# Patient Record
Sex: Female | Born: 1940
Health system: Southern US, Community
[De-identification: ages and names within clinical notes are randomized; demographics above are authoritative.]

## PROBLEM LIST (undated history)

## (undated) DIAGNOSIS — E669 Obesity, unspecified: Secondary | ICD-10-CM

## (undated) DIAGNOSIS — C3490 Malignant neoplasm of unspecified part of unspecified bronchus or lung: Secondary | ICD-10-CM

## (undated) DIAGNOSIS — G4733 Obstructive sleep apnea (adult) (pediatric): Secondary | ICD-10-CM

## (undated) DIAGNOSIS — R9389 Abnormal findings on diagnostic imaging of other specified body structures: Secondary | ICD-10-CM

## (undated) DIAGNOSIS — F329 Major depressive disorder, single episode, unspecified: Secondary | ICD-10-CM

## (undated) DIAGNOSIS — R61 Generalized hyperhidrosis: Secondary | ICD-10-CM

## (undated) DIAGNOSIS — T8859XA Other complications of anesthesia, initial encounter: Secondary | ICD-10-CM

## (undated) DIAGNOSIS — J069 Acute upper respiratory infection, unspecified: Secondary | ICD-10-CM

## (undated) DIAGNOSIS — J449 Chronic obstructive pulmonary disease, unspecified: Secondary | ICD-10-CM

## (undated) DIAGNOSIS — R0602 Shortness of breath: Secondary | ICD-10-CM

## (undated) DIAGNOSIS — D649 Anemia, unspecified: Secondary | ICD-10-CM

## (undated) DIAGNOSIS — M1990 Unspecified osteoarthritis, unspecified site: Secondary | ICD-10-CM

## (undated) DIAGNOSIS — G629 Polyneuropathy, unspecified: Secondary | ICD-10-CM

## (undated) DIAGNOSIS — F32A Depression, unspecified: Secondary | ICD-10-CM

## (undated) DIAGNOSIS — I447 Left bundle-branch block, unspecified: Secondary | ICD-10-CM

## (undated) DIAGNOSIS — K219 Gastro-esophageal reflux disease without esophagitis: Secondary | ICD-10-CM

## (undated) DIAGNOSIS — E785 Hyperlipidemia, unspecified: Secondary | ICD-10-CM

## (undated) DIAGNOSIS — T783XXA Angioneurotic edema, initial encounter: Secondary | ICD-10-CM

## (undated) DIAGNOSIS — M069 Rheumatoid arthritis, unspecified: Secondary | ICD-10-CM

## (undated) DIAGNOSIS — I10 Essential (primary) hypertension: Secondary | ICD-10-CM

## (undated) DIAGNOSIS — Z789 Other specified health status: Secondary | ICD-10-CM

## (undated) DIAGNOSIS — C55 Malignant neoplasm of uterus, part unspecified: Secondary | ICD-10-CM

## (undated) DIAGNOSIS — T4145XA Adverse effect of unspecified anesthetic, initial encounter: Secondary | ICD-10-CM

## (undated) HISTORY — PX: CATARACT EXTRACTION, BILATERAL: SHX1313

## (undated) HISTORY — DX: Unspecified osteoarthritis, unspecified site: M19.90

## (undated) HISTORY — PX: TONSILLECTOMY: SUR1361

## (undated) HISTORY — DX: Essential (primary) hypertension: I10

## (undated) HISTORY — DX: Malignant neoplasm of uterus, part unspecified: C55

## (undated) HISTORY — PX: ABDOMINAL HYSTERECTOMY: SHX81

## (undated) HISTORY — PX: COLONOSCOPY W/ BIOPSIES AND POLYPECTOMY: SHX1376

## (undated) HISTORY — DX: Generalized hyperhidrosis: R61

## (undated) HISTORY — DX: Rheumatoid arthritis, unspecified: M06.9

## (undated) HISTORY — DX: Angioneurotic edema, initial encounter: T78.3XXA

## (undated) HISTORY — DX: Polyneuropathy, unspecified: G62.9

## (undated) HISTORY — DX: Acute upper respiratory infection, unspecified: J06.9

## (undated) HISTORY — DX: Hyperlipidemia, unspecified: E78.5

## (undated) HISTORY — DX: Obesity, unspecified: E66.9

## (undated) HISTORY — DX: Obstructive sleep apnea (adult) (pediatric): G47.33

---

## 1969-01-06 DIAGNOSIS — C55 Malignant neoplasm of uterus, part unspecified: Secondary | ICD-10-CM

## 1969-01-06 HISTORY — DX: Malignant neoplasm of uterus, part unspecified: C55

## 1969-01-06 HISTORY — PX: VESICOVAGINAL FISTULA CLOSURE W/ TAH: SUR271

## 1997-07-02 ENCOUNTER — Ambulatory Visit: Admission: RE | Admit: 1997-07-02 | Discharge: 1997-07-02 | Payer: Self-pay | Admitting: Neurology

## 1997-09-26 ENCOUNTER — Ambulatory Visit: Admission: RE | Admit: 1997-09-26 | Discharge: 1997-09-26 | Payer: Self-pay | Admitting: Pulmonary Disease

## 1999-03-11 ENCOUNTER — Ambulatory Visit (HOSPITAL_COMMUNITY): Admission: RE | Admit: 1999-03-11 | Discharge: 1999-03-11 | Payer: Self-pay | Admitting: Gastroenterology

## 1999-03-11 ENCOUNTER — Encounter (INDEPENDENT_AMBULATORY_CARE_PROVIDER_SITE_OTHER): Payer: Self-pay

## 1999-06-19 ENCOUNTER — Other Ambulatory Visit: Admission: RE | Admit: 1999-06-19 | Discharge: 1999-06-19 | Payer: Self-pay | Admitting: Internal Medicine

## 2000-11-05 ENCOUNTER — Other Ambulatory Visit: Admission: RE | Admit: 2000-11-05 | Discharge: 2000-11-05 | Payer: Self-pay | Admitting: *Deleted

## 2005-01-07 ENCOUNTER — Encounter (HOSPITAL_COMMUNITY): Admission: RE | Admit: 2005-01-07 | Discharge: 2005-04-07 | Payer: Self-pay | Admitting: Internal Medicine

## 2005-02-17 ENCOUNTER — Ambulatory Visit (HOSPITAL_COMMUNITY): Admission: RE | Admit: 2005-02-17 | Discharge: 2005-02-17 | Payer: Self-pay | Admitting: Gastroenterology

## 2005-05-06 ENCOUNTER — Encounter (HOSPITAL_COMMUNITY): Admission: RE | Admit: 2005-05-06 | Discharge: 2005-08-04 | Payer: Self-pay | Admitting: Internal Medicine

## 2005-09-02 ENCOUNTER — Encounter (HOSPITAL_COMMUNITY): Admission: RE | Admit: 2005-09-02 | Discharge: 2005-12-01 | Payer: Self-pay | Admitting: Internal Medicine

## 2005-12-11 ENCOUNTER — Encounter (HOSPITAL_COMMUNITY): Admission: RE | Admit: 2005-12-11 | Discharge: 2006-03-11 | Payer: Self-pay | Admitting: Internal Medicine

## 2006-03-26 ENCOUNTER — Encounter (HOSPITAL_COMMUNITY): Admission: RE | Admit: 2006-03-26 | Discharge: 2006-06-24 | Payer: Self-pay | Admitting: Internal Medicine

## 2006-07-14 ENCOUNTER — Encounter (HOSPITAL_COMMUNITY): Admission: RE | Admit: 2006-07-14 | Discharge: 2006-10-12 | Payer: Self-pay | Admitting: Internal Medicine

## 2006-11-03 ENCOUNTER — Encounter (HOSPITAL_COMMUNITY): Admission: RE | Admit: 2006-11-03 | Discharge: 2007-02-01 | Payer: Self-pay | Admitting: Internal Medicine

## 2007-01-15 ENCOUNTER — Encounter: Admission: RE | Admit: 2007-01-15 | Discharge: 2007-01-15 | Payer: Self-pay | Admitting: Family Medicine

## 2007-02-16 ENCOUNTER — Encounter (HOSPITAL_COMMUNITY): Admission: RE | Admit: 2007-02-16 | Discharge: 2007-05-17 | Payer: Self-pay | Admitting: Internal Medicine

## 2007-06-01 ENCOUNTER — Encounter (HOSPITAL_COMMUNITY): Admission: RE | Admit: 2007-06-01 | Discharge: 2007-08-30 | Payer: Self-pay | Admitting: Internal Medicine

## 2007-09-14 ENCOUNTER — Encounter (HOSPITAL_COMMUNITY): Admission: RE | Admit: 2007-09-14 | Discharge: 2007-12-13 | Payer: Self-pay | Admitting: Internal Medicine

## 2008-01-04 ENCOUNTER — Encounter (HOSPITAL_COMMUNITY): Admission: RE | Admit: 2008-01-04 | Discharge: 2008-01-06 | Payer: Self-pay | Admitting: Internal Medicine

## 2008-01-06 ENCOUNTER — Other Ambulatory Visit: Admission: RE | Admit: 2008-01-06 | Discharge: 2008-01-06 | Payer: Self-pay | Admitting: Family Medicine

## 2008-02-01 ENCOUNTER — Encounter (HOSPITAL_COMMUNITY): Admission: RE | Admit: 2008-02-01 | Discharge: 2008-05-01 | Payer: Self-pay | Admitting: Internal Medicine

## 2008-03-11 ENCOUNTER — Encounter: Admission: RE | Admit: 2008-03-11 | Discharge: 2008-03-11 | Payer: Self-pay | Admitting: Family Medicine

## 2008-05-23 ENCOUNTER — Encounter (HOSPITAL_COMMUNITY): Admission: RE | Admit: 2008-05-23 | Discharge: 2008-08-21 | Payer: Self-pay | Admitting: Internal Medicine

## 2008-09-05 ENCOUNTER — Encounter (HOSPITAL_COMMUNITY): Admission: RE | Admit: 2008-09-05 | Discharge: 2008-12-04 | Payer: Self-pay | Admitting: Internal Medicine

## 2008-12-19 ENCOUNTER — Encounter (HOSPITAL_COMMUNITY): Admission: RE | Admit: 2008-12-19 | Discharge: 2009-03-19 | Payer: Self-pay | Admitting: Internal Medicine

## 2009-01-22 ENCOUNTER — Encounter: Admission: RE | Admit: 2009-01-22 | Discharge: 2009-01-22 | Payer: Self-pay | Admitting: Family Medicine

## 2009-04-11 ENCOUNTER — Encounter (HOSPITAL_COMMUNITY): Admission: RE | Admit: 2009-04-11 | Discharge: 2009-07-10 | Payer: Self-pay | Admitting: Internal Medicine

## 2009-06-26 DIAGNOSIS — E7800 Pure hypercholesterolemia, unspecified: Secondary | ICD-10-CM | POA: Insufficient documentation

## 2009-06-26 DIAGNOSIS — I10 Essential (primary) hypertension: Secondary | ICD-10-CM | POA: Insufficient documentation

## 2009-06-26 DIAGNOSIS — D649 Anemia, unspecified: Secondary | ICD-10-CM | POA: Insufficient documentation

## 2009-06-26 DIAGNOSIS — D069 Carcinoma in situ of cervix, unspecified: Secondary | ICD-10-CM | POA: Insufficient documentation

## 2009-06-26 DIAGNOSIS — E669 Obesity, unspecified: Secondary | ICD-10-CM | POA: Insufficient documentation

## 2009-06-26 DIAGNOSIS — M069 Rheumatoid arthritis, unspecified: Secondary | ICD-10-CM | POA: Insufficient documentation

## 2009-06-26 DIAGNOSIS — M810 Age-related osteoporosis without current pathological fracture: Secondary | ICD-10-CM | POA: Insufficient documentation

## 2009-06-26 DIAGNOSIS — R059 Cough, unspecified: Secondary | ICD-10-CM | POA: Insufficient documentation

## 2009-06-26 DIAGNOSIS — D638 Anemia in other chronic diseases classified elsewhere: Secondary | ICD-10-CM | POA: Insufficient documentation

## 2009-06-26 DIAGNOSIS — R05 Cough: Secondary | ICD-10-CM | POA: Insufficient documentation

## 2009-06-27 ENCOUNTER — Ambulatory Visit: Payer: Self-pay | Admitting: Emergency Medicine

## 2009-07-20 ENCOUNTER — Ambulatory Visit: Payer: Self-pay | Admitting: Emergency Medicine

## 2009-07-20 DIAGNOSIS — J449 Chronic obstructive pulmonary disease, unspecified: Secondary | ICD-10-CM | POA: Insufficient documentation

## 2009-07-26 ENCOUNTER — Encounter (HOSPITAL_COMMUNITY): Admission: RE | Admit: 2009-07-26 | Discharge: 2009-10-24 | Payer: Self-pay | Admitting: Internal Medicine

## 2009-08-07 ENCOUNTER — Encounter: Payer: Self-pay | Admitting: Emergency Medicine

## 2009-11-07 ENCOUNTER — Encounter (HOSPITAL_COMMUNITY)
Admission: RE | Admit: 2009-11-07 | Discharge: 2010-02-05 | Payer: Self-pay | Source: Home / Self Care | Attending: Internal Medicine | Admitting: Internal Medicine

## 2010-01-27 ENCOUNTER — Encounter: Payer: Self-pay | Admitting: Family Medicine

## 2010-02-05 NOTE — Assessment & Plan Note (Signed)
Summary: cough, COPD   Visit Type:  Follow-up Primary Provider/Referring Provider:  Maurice Small  CC:  Cough follow-up with PFT's.  The patient says her cough is slightly better. Still coughing up mostly clear or white mucus. SOB with exertion.Taylor Berry  History of Present Illness: 70 yo woman, former smoker, with obesity, HTN, RA, allergic rhinitis, OSA dx by PSG not currently on CPAP due to intolerance of high pressure. Referred for evaluation for chronic cough.   Has been seen for chronic cough by Dr Massie Maroon for cough over the last 2 yrs. Prior to that she did not cough frequently, only with URIs. She reports persistent cough for about 2 yrs, usuallly non-prod but sometimes white to clear, appears to be worse during the winter months. Has also had PND and allergies that have remained bothersome. Was tried on zyrtec, also on Advair, QVAR at one point, not on these currently. Was treated for the last 2 months empirically for GERD with lansoprasole, off it now. She sometimes wakes at night with cough, PND. No real voice changes, some globus sensation and throat clearing.   ROV 07/20/09 -- returns for cough and exertional dyspnea.  We started loratadine + fluticasone + NSWs, also omeprazole. Cough is better, still with nasal congestion and some gtt.  bv Current Medications (verified): 1)  Benicar 40 Mg Tabs (Olmesartan Medoxomil) .Taylor Berry.. 1 Once Daily 2)  Proair Hfa 108 (90 Base) Mcg/act Aers (Albuterol Sulfate) .... 2 Puffs Every 4-6 Hours As Needed 3)  Effexor Xr 75 Mg Xr24h-Cap (Venlafaxine Hcl) .Taylor Berry.. 1 Once Daily 4)  Calcium Plus Vitamin D 500-50 Mg-Unit Caps (Calcium Carbonate-Vitamin D) .... 2 Once Daily 5)  Remicade 100 Mg Solr (Infliximab) .... As Directed 6)  Multivitamins  Tabs (Multiple Vitamin) .Taylor Berry.. 1 Once Daily 7)  Folic Acid 1 Mg Tabs (Folic Acid) .Taylor Berry.. 1 Once Daily 8)  Zocor 20 Mg Tabs (Simvastatin) .Taylor Berry.. 1 Tablet At Bedtime 9)  Loratadine 10 Mg Tabs (Loratadine) .Taylor Berry.. 1 By Mouth Once  Daily 10)  Omeprazole 20 Mg Cpdr (Omeprazole) .Taylor Berry.. 1 By Mouth Once Daily 11)  Fluticasone Propionate 50 Mcg/act Susp (Fluticasone Propionate) .... 2 Sprays Each Nostril Two Times A Day  Allergies (verified): 1)  ! Ace Inhibitors 2)  ! Hydrochlorothiazide 3)  ! * Latex 4)  Pcn  Vital Signs:  Patient profile:   70 year old female Height:      64 inches (162.56 cm) Weight:      262 pounds (119.09 kg) BMI:     45.13 O2 Sat:      93 % on Room air Temp:     97.6 degrees F (36.44 degrees C) oral Pulse rate:   97 / minute BP sitting:   126 / 80  (left arm) Cuff size:   large  Vitals Entered By: Michel Bickers CMA (July 20, 2009 1:10 PM)  O2 Sat at Rest %:  93 O2 Flow:  Room air CC: Cough follow-up with PFT's.  The patient says her cough is slightly better. Still coughing up mostly clear or white mucus. SOB with exertion. Comments Medications reviewed. Daytime phone verified. Michel Bickers [facility]  July 20, 2009 1:11 PM   Physical Exam  General:  normal appearance, healthy appearing, and obese.   Head:  normocephalic and atraumatic Eyes:  conjunctiva and sclera clear Nose:  no deformity, discharge, inflammation, or lesions Mouth:  narrow post pharynx Neck:  no masses, thyromegaly, or abnormal cervical nodes Lungs:  clear bilaterally to auscultation and  percussion Heart:  regular rate and rhythm, S1, S2 without murmurs, rubs, gallops, or clicks Abdomen:  obese, NT Msk:  no deformity or scoliosis noted with normal posture Extremities:  trace edema Neurologic:  non-focal   Pulmonary Function Test Date: 07/20/2009 Height (in.): 64 Gender: Female  Pre-Spirometry FVC    Value: 1.93 L/min   Pred: 2.88 L/min     % Pred: 67 % FEV1    Value: 1.31 L     Pred: 2.06 L     % Pred: 64 % FEV1/FVC  Value: 68 %     Pred: 72 %    FEF 25-75  Value: 0.86 L/min   Pred: 2.35 L/min     % Pred: 36 %  Post-Spirometry FVC    Value: 1.82 L/min   Pred: 2.88 L/min     % Pred: 63 % FEV1    Value: 1.24 L      Pred: 2.06 L     % Pred: 60 % FEV1/FVC  Value: 68 %     Pred: 72 %    FEF 25-75  Value: 0.74 L/min   Pred: 2.35 L/min     % Pred: 31 %  Lung Volumes TLC    Value: 3.49 L   % Pred: 72 % RV    Value: 1.55 L   % Pred: 81 % DLCO    Value: 15.4 %   % Pred: 58 % DLCO/VA  Value: 5.38 %   % Pred: 151 %  Comments: Mild to mod AFL, no BD response, restricted volumes, DLCO corrects to normal range for Va. RSV  Impression & Recommendations:  Problem # 1:  COUGH (ICD-786.2) Has responded to allergy regimen, also on pantoprazole.  - NSW - loratadine - fluticasone - stop pantoprazole.  - refer to ENT for persistant symptoms.  - ROV 6 mo or as needed   Problem # 2:  COPD (ICD-496) By PFT. Mild to mod disease.  - will defer BD's at this time.  - may need to reconsider in the future.   Other Orders: Est. Patient Level IV (45409) ENT Referral (ENT)  Patient Instructions: 1)  Continue your loratadine, fluticasone, and nasal saline washes 2)  Stop pantoprazole 3)  We will refer you to see ENT regarding your nasal congestion and obstruction 4)  Follow up with Dr Delton Coombes in 6 months or as needed

## 2010-02-05 NOTE — Consult Note (Signed)
Summary: [facility]  [facility]   Imported By: Sherian Rein 08/22/2009 15:13:21  _____________________________________________________________________  External Attachment:    Type:   Image     Comment:   External Document

## 2010-02-05 NOTE — Assessment & Plan Note (Signed)
Summary: cough   Visit Type:  Initial Consult Primary Provider/Referring Provider:  Maurice Small  CC:  Chronic Cough.  History of Present Illness: 70 yo woman, former smoker, with obesity, HTN, RA, allergic rhinitis, OSA dx by PSG not currently on CPAP due to intolerance of high pressure. Referred for evaluation for chronic cough.   Has been seen for chronic cough by Dr Massie Maroon for cough over the last 2 yrs. Prior to that she did not cough frequently, only with URIs. She reports persistent cough for about 2 yrs, usuallly non-prod but sometimes white to clear, appears to be worse during the winter months. Has also had PND and allergies that have remained bothersome. Was tried on zyrtec, also on Advair, QVAR at one point, not on these currently. Was treated for the last 2 months empirically for GERD with lansoprasole, off it now. She sometimes wakes at night with cough, PND. No real voice changes, some globus sensation and throat clearing.   CXR clear  Preventive Screening-Counseling & Management  Alcohol-Tobacco     Smoking Status: quit  Current Medications (verified): 1)  Benicar 40 Mg Tabs (Olmesartan Medoxomil) .Marland Kitchen.. 1 Once Daily 2)  Proair Hfa 108 (90 Base) Mcg/act Aers (Albuterol Sulfate) .... 2 Puffs Every 4-6 Hours As Needed 3)  Effexor Xr 75 Mg Xr24h-Cap (Venlafaxine Hcl) .Marland Kitchen.. 1 Once Daily 4)  Calcium Plus Vitamin D 500-50 Mg-Unit Caps (Calcium Carbonate-Vitamin D) .... 2 Once Daily 5)  Remicade 100 Mg Solr (Infliximab) .... As Directed 6)  Multivitamins  Tabs (Multiple Vitamin) .Marland Kitchen.. 1 Once Daily 7)  Folic Acid 1 Mg Tabs (Folic Acid) .Marland Kitchen.. 1 Once Daily 8)  Zocor 20 Mg Tabs (Simvastatin) .Marland Kitchen.. 1 Tablet At Bedtime  Allergies: 1)  ! Ace Inhibitors 2)  ! Hydrochlorothiazide 3)  ! 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  quit smoking in 1992, started in 1956 smoked about 1 pack per day Quit drinking in 1988, started 1968 Smoking Status:  quit  Vital Signs:  Patient profile:   70 year old female Height:      63 inches Weight:      262 pounds BMI:     46.58 O2 Sat:      95 % on Room air Temp:     98.2 degrees F oral Pulse rate:   78 / minute BP sitting:   120 / 78  (left arm) Cuff size:   large  Vitals Entered By: Denna Haggard, CMA (June 27, 2009 2:52 PM)  O2 Sat at Rest %:  95% O2 Flow:  Room air  Reason for Visit Chronic Cough  Physical Exam  General:  normal appearance, healthy appearing, and obese.   Head:  normocephalic and atraumatic Eyes:  conjunctiva and sclera clear Nose:  no deformity, discharge, inflammation, or lesions Mouth:  narrow post pharynx Neck:  no masses, thyromegaly, or abnormal cervical nodes Lungs:  clear bilaterally to auscultation and percussion Heart:  regular rate and rhythm, S1, S2 without murmurs, rubs, gallops, or clicks Abdomen:  obese, NT Msk:  no deformity or scoliosis noted with normal posture Extremities:  trace edema Neurologic:  CN II-XII grossly intact with normal reflexes, coordination, muscle strength and tone  Impression & Recommendations:  Problem # 1:  COUGH (ICD-786.2)  Contributors include PND +/- GERD. She is off the ACE-I, now on ARB.  Start NSW, fluticasone spray, loratadine Restart pantoprazole full PFT consider FOB in future if no impact on cough discussed throat clearing and voice rest w her today.  ROV 3 weeks  Orders: Prescription Created Electronically [phone]) Consultation Level IV [phone])  Medications Added to Medication List This Visit: 1)  Zocor 20 Mg Tabs (Simvastatin) .Marland Kitchen.. 1 tablet at bedtime 2)  Loratadine 10 Mg Tabs (Loratadine) .Marland Kitchen.. 1 by mouth once daily 3)  Omeprazole 20 Mg  Cpdr (Omeprazole) .Marland Kitchen.. 1 by mouth once daily 4)  Fluticasone Propionate 50 Mcg/act Susp (Fluticasone propionate) .... 2 sprays each nostril two times a day  Patient Instructions: 1)  Start loratadine 10mg  by mouth once daily  2)  Start fluticasone nasal spray, 2 sprays each nostril two times a day 3)  Start nasal saline rinses once daily  4)  Restart your pantoprazole once daily  5)  Full PFT's  6)  Follow up with Dr Delton Coombes in 3 weeks to review your symptoms Prescriptions: FLUTICASONE PROPIONATE 50 MCG/ACT SUSP (FLUTICASONE PROPIONATE) 2 sprays each nostril two times a day  #1 x 4   Entered and Authorized by:   Leslye Peer MD   Signed by:   Leslye Peer MD on 06/27/2009   Method used:   Electronically to        [facility] (retail)       [address]       [place], [place]  [address]       Ph: [phone] or [phone]       Fax: [phone]   RxID:   [id number] OMEPRAZOLE 20 MG CPDR (OMEPRAZOLE) 1 by mouth once daily  #30 x 4   Entered and Authorized by:   Leslye Peer MD   Signed by:   Leslye Peer MD on 06/27/2009   Method used:   Electronically to        [facility] (retail)       [address]       [place], [place]  [address]       Ph: [phone] or [phone]       Fax: [phone]   RxID:   [id number] LORATADINE 10 MG TABS (LORATADINE) 1 by mouth once daily  #30 x 4   Entered and Authorized by:   Leslye Peer MD   Signed by:   Leslye Peer MD on 06/27/2009   Method used:   Electronically to        [facility] (retail)       [address]       [place], [place]  [address]       Ph: [phone] or [phone]       Fax: [phone]   RxID:   [id number]    Immunization History:  Influenza Immunization History:    Influenza:  fluvax 3+ (10/06/2008)  Pneumovax Immunization History:    Pneumovax:  pneumovax (12/08/2007)

## 2010-02-05 NOTE — Miscellaneous (Signed)
Summary: Orders Update pft charges  Clinical Lists Changes  Orders: Added new Service order of Carbon Monoxide diffusing w/capacity (94720) - Signed Added new Service order of Lung Volumes (94240) - Signed Added new Service order of Spirometry (Pre & Post) (94060) - Signed 

## 2010-02-15 ENCOUNTER — Ambulatory Visit
Admission: RE | Admit: 2010-02-15 | Discharge: 2010-02-15 | Disposition: A | Discharge status: Home / Self Care | Payer: Medicare Other | Source: Ambulatory Visit | Attending: Family Medicine | Admitting: Family Medicine

## 2010-02-15 ENCOUNTER — Other Ambulatory Visit: Payer: Self-pay | Admitting: Family Medicine

## 2010-02-15 DIAGNOSIS — R05 Cough: Secondary | ICD-10-CM

## 2010-02-15 DIAGNOSIS — R059 Cough, unspecified: Secondary | ICD-10-CM

## 2010-02-20 ENCOUNTER — Encounter (HOSPITAL_COMMUNITY)
Admission: RE | Admit: 2010-02-20 | Discharge: 2010-02-20 | Disposition: A | Discharge status: Home / Self Care | Payer: Medicare Other | Source: Ambulatory Visit | Attending: Internal Medicine | Admitting: Internal Medicine

## 2010-02-20 DIAGNOSIS — M069 Rheumatoid arthritis, unspecified: Secondary | ICD-10-CM | POA: Insufficient documentation

## 2010-03-27 ENCOUNTER — Encounter (HOSPITAL_COMMUNITY): Payer: Medicare Other | Attending: Internal Medicine

## 2010-03-27 DIAGNOSIS — M069 Rheumatoid arthritis, unspecified: Secondary | ICD-10-CM | POA: Insufficient documentation

## 2010-05-01 ENCOUNTER — Encounter (HOSPITAL_COMMUNITY): Payer: No Typology Code available for payment source

## 2010-05-06 ENCOUNTER — Encounter (HOSPITAL_COMMUNITY): Payer: Medicare Other | Attending: Internal Medicine

## 2010-05-06 DIAGNOSIS — M069 Rheumatoid arthritis, unspecified: Secondary | ICD-10-CM | POA: Insufficient documentation

## 2010-05-24 NOTE — Op Note (Signed)
NAMEKELIYAH, SPILLMAN NO.:  [id number]   MEDICAL RECORD NO.:  [id number]          PATIENT TYPE:  AMB   LOCATION:  ENDO                         FACILITY:  [facility]   PHYSICIAN:  James L. Malon Kindle., M.D.DATE OF BIRTH:  Nov 18, 1940   DATE OF PROCEDURE:  02/17/2005  DATE OF DISCHARGE:                                 OPERATIVE REPORT   PROCEDURE:  Colonoscopy.   MEDICATIONS:  Not recorded on my sheet by the nurse.  Will need to see the  nurse's notes for the exact count.   SCOPE:  Olympus pediatric adjustable colonoscope.   INDICATIONS:  Previous history of adenomatous polyp __________.   DESCRIPTION OF PROCEDURE:  The procedure explained to the patient and  consent obtained.  In the left lateral decubitus position, the Olympus scope  was inserted and advanced.  The prep was excellent.  Using abdominal  pressure and placing the patient in supine position, we arrived at the  cecum.  The ileocecal valve was seen transiently.  The appendiceal orifice  was seen.  The scope was withdrawn.  The mucosa was carefully examined for  polyps.  No polyps were seen throughout.  There were multiple small  diverticula throughout the colon.  The patient had pan diverticulosis.  It  was particularly prominent in the left colon and was also present in the  transverse and right colon.  The scope was withdrawn.  The patient tolerated  the procedure well.   ASSESSMENT:  1.  History of polyps with negative colon at this time.  V12.72.  2.  Pan diverticulosis.  562.10.   PLAN:  Routine followup with yearly Hemoccults.  Repeat colon in five years.           ______________________________  Llana Aliment Malon Kindle., M.D.     Waldron Session  D:  02/17/2005  T:  02/17/2005  Job:  [id number]   cc:   Gretta Arab. Valentina Lucks, M.D.  Fax: [phone]   Dr. Weston Anna

## 2010-06-10 ENCOUNTER — Encounter (HOSPITAL_COMMUNITY): Payer: Medicare Other | Attending: Internal Medicine

## 2010-06-10 DIAGNOSIS — M069 Rheumatoid arthritis, unspecified: Secondary | ICD-10-CM | POA: Insufficient documentation

## 2010-07-15 ENCOUNTER — Encounter (HOSPITAL_COMMUNITY): Payer: Medicare Other

## 2010-07-19 ENCOUNTER — Encounter (HOSPITAL_COMMUNITY): Payer: Medicare Other | Attending: Internal Medicine

## 2010-07-19 DIAGNOSIS — M069 Rheumatoid arthritis, unspecified: Secondary | ICD-10-CM | POA: Insufficient documentation

## 2010-08-16 ENCOUNTER — Encounter (HOSPITAL_COMMUNITY): Payer: Medicare Other

## 2010-08-28 ENCOUNTER — Encounter (HOSPITAL_COMMUNITY): Payer: Medicare Other | Attending: Internal Medicine

## 2010-08-28 ENCOUNTER — Encounter (HOSPITAL_COMMUNITY): Payer: Medicare Other

## 2010-08-28 DIAGNOSIS — M069 Rheumatoid arthritis, unspecified: Secondary | ICD-10-CM | POA: Insufficient documentation

## 2010-10-02 ENCOUNTER — Encounter (HOSPITAL_COMMUNITY): Payer: Medicare Other | Attending: Internal Medicine

## 2010-10-02 DIAGNOSIS — M069 Rheumatoid arthritis, unspecified: Secondary | ICD-10-CM | POA: Insufficient documentation

## 2010-11-06 ENCOUNTER — Encounter (HOSPITAL_COMMUNITY)
Admission: RE | Admit: 2010-11-06 | Discharge: 2010-11-06 | Disposition: A | Discharge status: Home / Self Care | Payer: Medicare Other | Source: Ambulatory Visit | Attending: Internal Medicine | Admitting: Internal Medicine

## 2010-11-06 DIAGNOSIS — M069 Rheumatoid arthritis, unspecified: Secondary | ICD-10-CM | POA: Insufficient documentation

## 2010-11-21 ENCOUNTER — Encounter: Payer: Self-pay | Admitting: Emergency Medicine

## 2010-11-22 ENCOUNTER — Ambulatory Visit (INDEPENDENT_AMBULATORY_CARE_PROVIDER_SITE_OTHER): Payer: Medicare Other | Admitting: Emergency Medicine

## 2010-11-22 ENCOUNTER — Encounter: Payer: Self-pay | Admitting: Emergency Medicine

## 2010-11-22 DIAGNOSIS — R0609 Other forms of dyspnea: Secondary | ICD-10-CM | POA: Insufficient documentation

## 2010-11-22 DIAGNOSIS — J449 Chronic obstructive pulmonary disease, unspecified: Secondary | ICD-10-CM

## 2010-11-22 DIAGNOSIS — R059 Cough, unspecified: Secondary | ICD-10-CM

## 2010-11-22 DIAGNOSIS — R0989 Other specified symptoms and signs involving the circulatory and respiratory systems: Secondary | ICD-10-CM

## 2010-11-22 DIAGNOSIS — J4489 Other specified chronic obstructive pulmonary disease: Secondary | ICD-10-CM

## 2010-11-22 DIAGNOSIS — R0600 Dyspnea, unspecified: Secondary | ICD-10-CM | POA: Insufficient documentation

## 2010-11-22 DIAGNOSIS — R05 Cough: Secondary | ICD-10-CM

## 2010-11-22 NOTE — Patient Instructions (Signed)
Stop QVAR Start Spiriva 1 inhalation daily Use Ventolin 2 puffs if needed for shortness of breath We will refer you to see Cardiology to insure no contribution to your shortness of breath Consider restarting your loratadine for nasal congestion Walking oximetry today  Follow with Dr Delton Coombes in 1 month

## 2010-11-22 NOTE — Assessment & Plan Note (Signed)
-   d/c QVAR - start Spiriva + SABA, asked her to use SABA more liberally to assess true benefit

## 2010-11-22 NOTE — Progress Notes (Signed)
70 yo woman, former smoker, with obesity, HTN, RA, allergic rhinitis, OSA dx by PSG not currently on CPAP due to intolerance of high pressure. Referred for evaluation for chronic cough.   Has been seen for chronic cough by Dr Massie Maroon for cough over the last 2 yrs. Prior to that she did not cough frequently, only with URIs. She reports persistent cough for about 2 yrs, usuallly non-prod but sometimes white to clear, appears to be worse during the winter months. Has also had PND and allergies that have remained bothersome. Was tried on zyrtec, also on Advair, QVAR at one point, not on these currently. Was treated for the last 2 months empirically for GERD with lansoprasole, off it now. She sometimes wakes at night with cough, PND. No real voice changes, some globus sensation and throat clearing.   ROV 07/20/09 -- returns for cough and exertional dyspnea. We started loratadine + fluticasone + NSWs, also omeprazole. Cough is better, still with nasal congestion and some gtt.   ROV 11/22/10 -- Seen previously for dyspnea and chronic cough.Also hx untreated OSA.  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 Her cough got better with rx PND and GERD, she was seen by Dr Annalee Genta - eval consistent w GERD related cough.  Current regimen = omeprazole (stopped fluticasone and loratadine). Her cough is better now, she believes that stopping Ca has made this better. Still has a little cough.  Her biggest complaint is progressive exertional dyspnea since July '12. Notices it with walking. Her eval has included clear CXR's, normal ECG.  She had a stress test in 2002 (?). Her wt has been stable.  She was told to go back on QVAR 2 weeks ago -> no change in her SOB. Has not used SABA.  She still believes that she cannot do CPAP due to the high required pressures. She has never seen a cardiologist formally, interested in seeing Dr Jens Som.    EXAM:  Gen: Pleasant, well-nourished, in no  distress,  normal affect  ENT: No lesions,  mouth clear,  oropharynx clear, no postnasal drip  Neck: No JVD, no TMG, no carotid bruits  Lungs: No use of accessory muscles, no dullness to percussion, clear without rales or rhonchi  Cardiovascular: RRR, heart sounds normal, no murmur or gallops, no peripheral edema  Abdomen: soft and NT, no HSM,  BS normal  Musculoskeletal: No deformities, no cyanosis or clubbing  Neuro: alert, non focal  Skin: Warm, no lesions or rashes   Pulmonary Function Test  Date: 07/20/2009  Height (in.): 64  Gender: Female  Pre-Spirometry  FVC Value: 1.93 L/min Pred: 2.88 L/min % Pred: 67 %  FEV1 Value: 1.31 L Pred: 2.06 L % Pred: 64 %  FEV1/FVC Value: 68 % Pred: 72 % FEF 25-75 Value: 0.86 L/min Pred: 2.35 L/min % Pred: 36 %  Post-Spirometry  FVC Value: 1.82 L/min Pred: 2.88 L/min % Pred: 63 %  FEV1 Value: 1.24 L Pred: 2.06 L % Pred: 60 %  FEV1/FVC Value: 68 % Pred: 72 % FEF 25-75 Value: 0.74 L/min Pred: 2.35 L/min % Pred: 31 %  Lung Volumes  TLC Value: 3.49 L % Pred: 72 %  RV Value: 1.55 L % Pred: 81 %  DLCO Value: 15.4 % % Pred: 58 %  DLCO/VA Value: 5.38 % % Pred: 151 %  Comments:  Mild to mod AFL, no BD response, restricted volumes, DLCO corrects to normal range for Va.  RSV    Dyspnea on exertion Suspect that this is related to her COPD (currently untreated), obesity. Must consider also cardiac component or secondary PAH due to untreated OSA.  - will start COPD meds as below - walking oximetry today - discussed OSA, possible other options for treatment - feel she needs cardiac risk stratification, will refer for eval by Dr Jens Som - rov 1 month to review   COPD - d/c QVAR - start Spiriva + SABA, asked her to use SABA more liberally to assess true benefit   COUGH - better, although on exam it would appear that she would benefit from restarting her allergy regimen

## 2010-11-22 NOTE — Assessment & Plan Note (Signed)
-   better, although on exam it would appear that she would benefit from restarting her allergy regimen

## 2010-11-22 NOTE — Assessment & Plan Note (Signed)
Suspect that this is related to her COPD (currently untreated), obesity. Must consider also cardiac component or secondary PAH due to untreated OSA.  - will start COPD meds as below - walking oximetry today - discussed OSA, possible other options for treatment - feel she needs cardiac risk stratification, will refer for eval by Dr Jens Som - rov 1 month to review

## 2010-12-02 ENCOUNTER — Telehealth: Payer: Self-pay | Admitting: Emergency Medicine

## 2010-12-02 MED ORDER — TIOTROPIUM BROMIDE MONOHYDRATE 18 MCG IN CAPS: 18.0000 ug | ORAL_CAPSULE | Freq: Every day | RESPIRATORY_TRACT | Status: DC

## 2010-12-02 NOTE — Telephone Encounter (Signed)
Pt stated she has completed sample given of Spiriva. Rx sent to pt's pharmacy. Pt aware.

## 2010-12-03 ENCOUNTER — Telehealth: Payer: Self-pay | Admitting: Emergency Medicine

## 2010-12-03 NOTE — Telephone Encounter (Signed)
I have called [facility] and gave verbal order for pt's spiriva. Pt is aware

## 2010-12-06 ENCOUNTER — Ambulatory Visit (HOSPITAL_COMMUNITY): Payer: Self-pay | Admitting: *Deleted

## 2010-12-10 ENCOUNTER — Encounter (HOSPITAL_COMMUNITY)
Admission: RE | Admit: 2010-12-10 | Discharge: 2010-12-10 | Disposition: A | Discharge status: Home / Self Care | Payer: Medicare Other | Source: Ambulatory Visit | Attending: Internal Medicine | Admitting: Internal Medicine

## 2010-12-10 DIAGNOSIS — M069 Rheumatoid arthritis, unspecified: Secondary | ICD-10-CM | POA: Insufficient documentation

## 2010-12-10 MED ORDER — SODIUM CHLORIDE 0.9 % IV SOLN
600.0000 mg | INTRAVENOUS | Status: AC
  Administered 2010-12-10: 600 mg via INTRAVENOUS
  Filled 2010-12-10: qty 60

## 2010-12-10 MED ORDER — LORATADINE 10 MG PO TABS
10.0000 mg | ORAL_TABLET | Freq: Every day | ORAL | Status: DC
  Filled 2010-12-10: qty 1

## 2010-12-11 ENCOUNTER — Encounter (HOSPITAL_COMMUNITY): Payer: Medicare Other

## 2010-12-17 ENCOUNTER — Ambulatory Visit (INDEPENDENT_AMBULATORY_CARE_PROVIDER_SITE_OTHER): Payer: Medicare Other | Admitting: Cardiology

## 2010-12-17 ENCOUNTER — Encounter: Payer: Self-pay | Admitting: Cardiology

## 2010-12-17 VITALS — BP 174/102 | HR 89 | Wt 264.0 lb

## 2010-12-17 DIAGNOSIS — I10 Essential (primary) hypertension: Secondary | ICD-10-CM

## 2010-12-17 DIAGNOSIS — R0600 Dyspnea, unspecified: Secondary | ICD-10-CM

## 2010-12-17 DIAGNOSIS — E7800 Pure hypercholesterolemia, unspecified: Secondary | ICD-10-CM

## 2010-12-17 DIAGNOSIS — R0609 Other forms of dyspnea: Secondary | ICD-10-CM

## 2010-12-17 DIAGNOSIS — R0989 Other specified symptoms and signs involving the circulatory and respiratory systems: Secondary | ICD-10-CM

## 2010-12-17 NOTE — Assessment & Plan Note (Signed)
The patient presents with significant dyspnea on exertion. Etiology unclear. There is most likely a component of COPD and OHS/OSA. I will arrange my view to exclude ischemia particularly in light of left bundle branch block. We'll also schedule echocardiogram to quantify LV function, diastolic function and RV function. Check BNP. She may ultimately require right heart catheterization to evaluate for pulmonary hypertension.

## 2010-12-17 NOTE — Assessment & Plan Note (Signed)
Blood pressure increased. Continue present medications. She will follow this at home and we will add additional medications as needed. Question component of diastolic dysfunction from hypertension contributing to her dyspnea.

## 2010-12-17 NOTE — Assessment & Plan Note (Signed)
Management per primary care. 

## 2010-12-17 NOTE — Patient Instructions (Signed)
Your physician recommends that you schedule a follow-up appointment in: 4-6 WEEKS  Your physician has requested that you have a lexiscan myoview. For further information please visit [email or web address] Please follow instruction sheet, as given.   Your physician has requested that you have an echocardiogram. Echocardiography is a painless test that uses sound waves to create images of your heart. It provides your doctor with information about the size and shape of your heart and how well your heart's chambers and valves are working. This procedure takes approximately one hour. There are no restrictions for this procedure.   Your physician recommends that you return for lab work in: TODAY

## 2010-12-17 NOTE — Progress Notes (Signed)
HPI:70 yo female with no prior cardiac history for evalation of dyspnea. Chest xray 11-05-10 showed chronic bronchitic changes. Patient describes dyspnea on exertion since July of 2012. She now notices dyspnea after walking approximately 15 steps. There is no orthopnea or PND and she denies pedal edema. No chest pain or syncope. Her note she is unable to tolerate her CPAP.  Current Outpatient Prescriptions  Medication Sig Dispense Refill  . albuterol (VENTOLIN HFA) 108 (90 BASE) MCG/ACT inhaler Inhale 2 puffs into the lungs every 6 (six) hours as needed.        . cetirizine (ZYRTEC) 10 MG tablet Take 10 mg by mouth as needed.        . Cholecalciferol (VITAMIN D PO) Take by mouth daily.        . fluticasone (FLONASE) 50 MCG/ACT nasal spray Place 2 sprays into the nose 2 (two) times daily.        . folic acid (FOLVITE) 1 MG tablet Take 1 mg by mouth daily.        Marland Kitchen inFLIXimab (REMICADE) 100 MG injection As directed every 5 weeks      . olmesartan (BENICAR) 40 MG tablet Take 40 mg by mouth daily.        Marland Kitchen omeprazole (PRILOSEC) 40 MG capsule Take 40 mg by mouth daily.        . simvastatin (ZOCOR) 20 MG tablet Take 20 mg by mouth at bedtime.        Marland Kitchen tiotropium (SPIRIVA HANDIHALER) 18 MCG inhalation capsule Place 1 capsule (18 mcg total) into inhaler and inhale daily.  30 capsule  1  . venlafaxine (EFFEXOR-XR) 75 MG 24 hr capsule Take 75 mg by mouth daily.           Past Medical History  Diagnosis Date  . Hypertension   . Rheumatoid arthritis   . Osteoporosis   . Allergic rhinitis   . Hyperlipidemia   . Obesity   . OSA (obstructive sleep apnea)   . Osteoarthritis   . Uterine cancer     Past Surgical History  Procedure Date  . Vesicovaginal fistula closure w/ tah 1971  . Tonsillectomy     History   Social History  . Marital Status: Divorced    Spouse Name: N/A    Number of Children: 2  . Years of Education: N/A   Occupational History  . counselor   .     Social History Main  Topics  . Smoking status: Former Smoker -- 1.0 packs/day for 35 years    Types: Cigarettes    Quit date: 01/06/1990  . Smokeless tobacco: Not on file  . Alcohol Use: No  . Drug Use: Not on file  . Sexually Active: Not on file   Other Topics Concern  . Not on file   Social History Narrative  . No narrative on file    ROS: arthragias but no fevers or chills, productive cough, hemoptysis, dysphasia, odynophagia, melena, hematochezia, dysuria, hematuria, rash, seizure activity, orthopnea, PND, pedal edema, claudication. Remaining systems are negative.  Physical Exam: Well-developed morbidly obese in no acute distress.  Skin is warm and dry.  HEENT is normal.  Neck is supple. No thyromegaly.  Chest is clear to auscultation with normal expansion.  Cardiovascular exam is regular rate and rhythm.  Abdominal exam nontender or distended. No masses palpated. Extremities show trace edema. neuro grossly intact  ECG NSR with ILBBB

## 2010-12-18 LAB — BASIC METABOLIC PANEL
BUN: 12 mg/dL (ref 6–23)
CO2: 31 mEq/L (ref 19–32)
Calcium: 9.1 mg/dL (ref 8.4–10.5)
Chloride: 105 mEq/L (ref 96–112)
Creatinine, Ser: 0.8 mg/dL (ref 0.4–1.2)
GFR: 72.19 mL/min (ref >60.00)
Glucose, Bld: 131 mg/dL — ABNORMAL HIGH (ref 70–99)
Potassium: 5.3 mEq/L — ABNORMAL HIGH (ref 3.5–5.1)
Sodium: 141 mEq/L (ref 135–145)

## 2010-12-18 LAB — BRAIN NATRIURETIC PEPTIDE: Pro B Natriuretic peptide (BNP): 25 pg/mL (ref 0.0–100.0)

## 2010-12-23 ENCOUNTER — Ambulatory Visit: Payer: Medicare Other | Admitting: Emergency Medicine

## 2010-12-26 ENCOUNTER — Ambulatory Visit (HOSPITAL_BASED_OUTPATIENT_CLINIC_OR_DEPARTMENT_OTHER): Payer: Medicare Other | Admitting: Radiology

## 2010-12-26 ENCOUNTER — Ambulatory Visit (HOSPITAL_COMMUNITY): Payer: Medicare Other | Attending: Cardiology

## 2010-12-26 VITALS — BP 150/80 | Ht 63.0 in | Wt 258.0 lb

## 2010-12-26 DIAGNOSIS — I079 Rheumatic tricuspid valve disease, unspecified: Secondary | ICD-10-CM | POA: Insufficient documentation

## 2010-12-26 DIAGNOSIS — E785 Hyperlipidemia, unspecified: Secondary | ICD-10-CM | POA: Insufficient documentation

## 2010-12-26 DIAGNOSIS — R0600 Dyspnea, unspecified: Secondary | ICD-10-CM

## 2010-12-26 DIAGNOSIS — I10 Essential (primary) hypertension: Secondary | ICD-10-CM | POA: Insufficient documentation

## 2010-12-26 DIAGNOSIS — I059 Rheumatic mitral valve disease, unspecified: Secondary | ICD-10-CM | POA: Insufficient documentation

## 2010-12-26 DIAGNOSIS — R062 Wheezing: Secondary | ICD-10-CM

## 2010-12-26 DIAGNOSIS — R0989 Other specified symptoms and signs involving the circulatory and respiratory systems: Secondary | ICD-10-CM

## 2010-12-26 DIAGNOSIS — R0602 Shortness of breath: Secondary | ICD-10-CM

## 2010-12-26 DIAGNOSIS — I447 Left bundle-branch block, unspecified: Secondary | ICD-10-CM

## 2010-12-26 DIAGNOSIS — I4949 Other premature depolarization: Secondary | ICD-10-CM

## 2010-12-26 DIAGNOSIS — R0609 Other forms of dyspnea: Secondary | ICD-10-CM | POA: Insufficient documentation

## 2010-12-26 MED ORDER — ALBUTEROL SULFATE (5 MG/ML) 0.5% IN NEBU
5.0000 mg | INHALATION_SOLUTION | Freq: Once | RESPIRATORY_TRACT | Status: AC
  Administered 2010-12-26: 5 mg via RESPIRATORY_TRACT

## 2010-12-26 MED ORDER — ADENOSINE (DIAGNOSTIC) 3 MG/ML IV SOLN
0.5600 mg/kg | Freq: Once | INTRAVENOUS | Status: AC
  Administered 2010-12-26: 65.4 mg via INTRAVENOUS

## 2010-12-26 MED ORDER — TECHNETIUM TC 99M TETROFOSMIN IV KIT
33.0000 | PACK | Freq: Once | INTRAVENOUS | Status: AC | PRN
  Administered 2010-12-26: 33 via INTRAVENOUS

## 2010-12-26 MED ORDER — TECHNETIUM TC 99M TETROFOSMIN IV KIT
11.0000 | PACK | Freq: Once | INTRAVENOUS | Status: AC | PRN
  Administered 2010-12-26: 11 via INTRAVENOUS

## 2010-12-26 NOTE — Progress Notes (Signed)
Taylor Berry SITE 3 NUCLEAR MED [address] [place] [address] [phone]  Cardiology Nuclear Med Study  Taylor Berry is a 70 y.o. female [id number] 1940/03/29   Nuclear Med Background Indication for Stress Test:  Evaluation for Ischemia History:  COPD and ~10 yrs ago MPS:OK per patient Cardiac Risk Factors: History of Smoking, Hypertension, Lipids, Obesity, TIA and Adopted  Symptoms:  DOE and Fatigue   Nuclear Pre-Procedure Caffeine/Decaff Intake:  None NPO After: 7:00pm   Lungs:  Diminished with minimal expiratory rhonchi at time of check in with O2 SAT on RA 93%>94-96% w/db; Lungs:Clear prior to lexiscan, O2 SAT 94% on RA IV 0.9% NS with Angio Cath:  22g  IV Site: R Forearm x 1, tolerated well IV Started by:  Irean Hong, RN  Chest Size (in):  42 Cup Size: C  Height: 5\' 3"  (1.6 m)  Weight:  258 lb (117.028 kg)  BMI:  Body mass index is 45.70 kg/(m^2). Tech Comments: Patient c/o increase SOB and wheezing after adenosine prior to rest images. Lungs assessed with inspiratory and expiratory wheezing.Nebulizer mask tx with 5mg  albuterol solution with 8L O2 given with  clearing of wheezing and improvement of SOB. [name],RN     Nuclear Med Study 1 or 2 day study: 1 day  Stress Test Type:  Adenosine  Reading MD: Olga Millers, MD  Order Authorizing Provider:  Olga Millers, MD  Resting Radionuclide: Technetium 77m Tetrofosmin  Resting Radionuclide Dose: 11 mCi   Stress Radionuclide:  Technetium 71m Tetrofosmin  Stress Radionuclide Dose: 33 mCi           Stress Protocol Rest HR: 76 Stress HR: 100  Rest BP: 150/80 Stress BP: 152/68  Exercise Time (min): n/a METS: n/a   Predicted Max HR: 150 bpm % Max HR: 66.67 bpm Rate Pressure Product: 21308   Dose of Adenosine (mg):  60.0 Dose of Lexiscan: n/a mg  Dose of Atropine (mg): n/a Dose of Dobutamine: n/a mcg/kg/min (at max HR)  Stress Test Technologist: Smiley Houseman, CMA-N  Nuclear  Technologist:  Doyne Keel, CNMT     Rest Procedure:  Myocardial perfusion imaging was performed at rest 45 minutes following the intravenous administration of Technetium 17m Tetrofosmin.  Rest ECG: LBBB with occasional PVC.  Stress Procedure:  The patient received IV adenosine at 140 mcg/kg/min for 4 minutes.  There were no significant changes with infusion, other than occasional PVC's.  Technetium 33m Tetrofosmin was injected at the 2 minute mark and quantitative spect images were obtained after a 45 minute delay.  Stress ECG: No significant ST segment change suggestive of ischemia.  QPS Raw Data Images:  Acquisition technically good; normal left ventricular size. Stress Images:  There is decreased uptake in the distal anterior wall. Rest Images:  There is decreased uptake in the distal anterior wall, less prominent compared to the stress images. Subtraction (SDS):  These findings are consistent with soft tissue attenuation and mild ischemia. Transient Ischemic Dilatation (Normal <1.22):  1.00 Lung/Heart Ratio (Normal <0.45):  .35   Quantitative Gated Spect Images QGS EDV:  106 ml QGS ESV:  54 ml QGS cine images:  NL LV Function; NL Wall Motion (LV function appears to be better than calculated EF; suggest echo to better assess if clinically indicated). QGS EF: 49%  Impression Exercise Capacity:  Adenosine study with no exercise. BP Response:  Normal blood pressure response. Clinical Symptoms:  No chest pain. ECG Impression:  No significant ST segment change  suggestive of ischemia. Comparison with Prior Nuclear Study: No images to compare  Overall Impression:  Abnormal stress nuclear study with a small partially reversible distal anterior defect suggestive of soft tissue attenuation and mild ischemia.   Olga Millers

## 2010-12-27 ENCOUNTER — Ambulatory Visit (INDEPENDENT_AMBULATORY_CARE_PROVIDER_SITE_OTHER): Payer: Medicare Other | Admitting: Emergency Medicine

## 2010-12-27 ENCOUNTER — Encounter: Payer: Self-pay | Admitting: Emergency Medicine

## 2010-12-27 DIAGNOSIS — R0609 Other forms of dyspnea: Secondary | ICD-10-CM

## 2010-12-27 DIAGNOSIS — R0600 Dyspnea, unspecified: Secondary | ICD-10-CM

## 2010-12-27 DIAGNOSIS — J449 Chronic obstructive pulmonary disease, unspecified: Secondary | ICD-10-CM

## 2010-12-27 DIAGNOSIS — R0989 Other specified symptoms and signs involving the circulatory and respiratory systems: Secondary | ICD-10-CM

## 2010-12-27 DIAGNOSIS — J4489 Other specified chronic obstructive pulmonary disease: Secondary | ICD-10-CM

## 2010-12-27 DIAGNOSIS — R059 Cough, unspecified: Secondary | ICD-10-CM

## 2010-12-27 DIAGNOSIS — R05 Cough: Secondary | ICD-10-CM

## 2010-12-27 MED ORDER — FLUTICASONE PROPIONATE 50 MCG/ACT NA SUSP: 2.0000 | Freq: Two times a day (BID) | NASAL | Status: DC

## 2010-12-27 MED ORDER — CETIRIZINE HCL 10 MG PO CHEW: 10.0000 mg | CHEWABLE_TABLET | Freq: Every day | ORAL | Status: DC

## 2010-12-27 NOTE — Assessment & Plan Note (Addendum)
Large component of this is her obesity, also her COPD. Now with newly identified reversible defect on nuclear scan, possible clinically relevant CAD.  - has never been able to tolerate CPAP - no desat on walking oximetry - continue rx for her COPD - she has f/u with Dr Jens Som 1/18 at 10:00; ? Whether she will need cath given the nuclear results, will forward to Dr Jens Som

## 2010-12-27 NOTE — Patient Instructions (Signed)
Please continue your Spiriva every day Use your albuterol 2 puffs as needed for shortness of breath Follow with Dr Jens Som to plan the next steps in your heart evaluation. You have an appointment on 01/24/11 at 10:00.  Restart your fluticasone nasal spray twice a day Start using zyrtec daily Follow with Dr Delton Coombes in February or sooner if you have any problems.

## 2010-12-27 NOTE — Assessment & Plan Note (Signed)
Will continue Spiriva + SABA prn

## 2010-12-27 NOTE — Progress Notes (Signed)
70 yo woman, former smoker, with obesity, HTN, RA, allergic rhinitis, OSA dx by PSG not currently on CPAP due to intolerance of high pressure. Referred for evaluation for chronic cough.   Has been seen for chronic cough by Dr Lady Deutscher for cough over the last 2 yrs. Prior to that she did not cough frequently, only with URIs. She reports persistent cough for about 2 yrs, usuallly non-prod but sometimes white to clear, appears to be worse during the winter months. Has also had PND and allergies that have remained bothersome. Was tried on zyrtec, also on Advair, QVAR at one point, not on these currently. Was treated for the last 2 months empirically for GERD with lansoprasole, off it now. She sometimes wakes at night with cough, PND. No real voice changes, some globus sensation and throat clearing.   ROV 07/20/09 -- returns for cough and exertional dyspnea. We started loratadine + fluticasone + NSWs, also omeprazole. Cough is better, still with nasal congestion and some gtt.   ROV 11/22/10 -- Seen previously for dyspnea and chronic cough.Also hx untreated OSA.  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 Her cough got better with rx PND and GERD, she was seen by Dr Wilburn Cornelia - eval consistent w GERD related cough.  Current regimen = omeprazole (stopped fluticasone and loratadine). Her cough is better now, she believes that stopping Ca has made this better. Still has a little cough.  Her biggest complaint is progressive exertional dyspnea since July '12. Notices it with walking. Her eval has included clear CXR's, normal ECG.  She had a stress test in 2002 (?). Her wt has been stable.  She was told to go back on QVAR 2 weeks ago -> no change in her SOB. Has not used SABA.  She still believes that she cannot do CPAP due to the high required pressures. She has never seen a cardiologist formally, interested in seeing Dr Stanford Breed.   ROV 12/27/10 -- Returns for evaluation exertional  SOB in setting mixed disease, OSA (not on CPAP). We started Spiriva about 6 weeks ago, but she isn't sure that it has really helped. Maybe a small benefit in that it takes shorter time to recover. Use albuterol prn and finds that this helps her more. She saw Dr Stanford Breed as planned, underwent stress testing as below. Cough is stable, no better or worse.   Nuclear stress testing 12/20 by Dr Stanford Breed showed abnormal stress nuclear study with a small partially reversible distal anterior defect suggestive of soft tissue attenuation and mild ischemia.   EXAM:  Gen: Pleasant, well-nourished, in no distress,  normal affect  ENT: No lesions,  mouth clear,  oropharynx clear, no postnasal drip  Neck: No JVD, no TMG, no carotid bruits  Lungs: No use of accessory muscles, no dullness to percussion, clear without rales or rhonchi  Cardiovascular: RRR, heart sounds normal, no murmur or gallops, no peripheral edema  Abdomen: soft and NT, no HSM,  BS normal  Musculoskeletal: No deformities, no cyanosis or clubbing  Neuro: alert, non focal  Skin: Warm, no lesions or rashes   Pulmonary Function Test  Date: 07/20/2009  Height (in.): 64  Gender: Female  Pre-Spirometry  FVC Value: 1.93 L/min Pred: 2.88 L/min % Pred: 67 %  FEV1 Value: 1.31 L Pred: 2.06 L % Pred: 64 %  FEV1/FVC Value: 68 % Pred: 72 % FEF 25-75 Value: 0.86 L/min Pred: 2.35 L/min % Pred: 36 %  Post-Spirometry  FVC Value: 1.82 L/min Pred: 2.88 L/min % Pred: 63 %  FEV1 Value: 1.24 L Pred: 2.06 L % Pred: 60 %  FEV1/FVC Value: 68 % Pred: 72 % FEF 25-75 Value: 0.74 L/min Pred: 2.35 L/min % Pred: 31 %  Lung Volumes  TLC Value: 3.49 L % Pred: 72 %  RV Value: 1.55 L % Pred: 81 %  DLCO Value: 15.4 % % Pred: 58 %  DLCO/VA Value: 5.38 % % Pred: 151 %  Comments:  Mild to mod AFL, no BD response, restricted volumes, DLCO corrects to normal range for Va. RSV    COPD Will continue Spiriva + SABA prn  COUGH Will restart zyrtec daily, add  back nasal steroid.   Dyspnea on exertion Large component of this is her obesity, also her COPD. Now with newly identified reversible defect on nuclear scan, possible clinically relevant CAD.  - has never been able to tolerate CPAP - no desat on walking oximetry - continue rx for her COPD - she has f/u with Dr Stanford Breed 1/18 at 10:00; ? Whether she will need cath given the nuclear results, will forward to Dr Stanford Breed

## 2010-12-27 NOTE — Assessment & Plan Note (Signed)
Will restart zyrtec daily, add back nasal steroid.

## 2011-01-02 ENCOUNTER — Other Ambulatory Visit: Payer: Self-pay | Admitting: Emergency Medicine

## 2011-01-14 ENCOUNTER — Other Ambulatory Visit (HOSPITAL_COMMUNITY): Payer: Self-pay | Admitting: *Deleted

## 2011-01-14 ENCOUNTER — Encounter (HOSPITAL_COMMUNITY)
Admission: RE | Admit: 2011-01-14 | Discharge: 2011-01-14 | Disposition: A | Discharge status: Home / Self Care | Payer: Medicare Other | Source: Ambulatory Visit | Attending: Internal Medicine | Admitting: Internal Medicine

## 2011-01-14 DIAGNOSIS — M069 Rheumatoid arthritis, unspecified: Secondary | ICD-10-CM | POA: Diagnosis not present

## 2011-01-14 MED ORDER — SODIUM CHLORIDE 0.9 % IV SOLN
5.0000 mg/kg | INTRAVENOUS | Status: DC
  Administered 2011-01-14: 600 mg via INTRAVENOUS
  Filled 2011-01-14: qty 60

## 2011-01-14 MED ORDER — LORATADINE 10 MG PO TABS
10.0000 mg | ORAL_TABLET | ORAL | Status: DC
  Filled 2011-01-14: qty 1

## 2011-01-15 ENCOUNTER — Telehealth: Payer: Self-pay | Admitting: Cardiology

## 2011-01-15 ENCOUNTER — Encounter (HOSPITAL_COMMUNITY): Payer: Medicare Other

## 2011-01-15 NOTE — Telephone Encounter (Signed)
New Problem:     Patient has called trying to be seen sooner that 01/24/11.  Patient did not clarify reason for wanting to be seen sooner. Please call back.

## 2011-01-15 NOTE — Telephone Encounter (Signed)
Spoke with pt, she has a training class on the 18th and wanted to know if her appt could be sooner. Will look over schedule and try to accommodate.

## 2011-01-16 ENCOUNTER — Telehealth: Payer: Self-pay | Admitting: Cardiology

## 2011-01-16 NOTE — Telephone Encounter (Signed)
Spoke with pt, appt moved to the 16th

## 2011-01-16 NOTE — Telephone Encounter (Signed)
New Problem:     Patient called in for the second day wanting to schedule an appointment to see Dr. Stanford Breed earlier than her already set time.  Patient claims that she isn't having any problems, she just has a conflicting appointment scheduled for that same day. Please call back.

## 2011-01-22 ENCOUNTER — Ambulatory Visit (INDEPENDENT_AMBULATORY_CARE_PROVIDER_SITE_OTHER): Payer: Medicare Other | Admitting: Cardiology

## 2011-01-22 ENCOUNTER — Encounter: Payer: Self-pay | Admitting: Cardiology

## 2011-01-22 ENCOUNTER — Encounter: Payer: Self-pay | Admitting: *Deleted

## 2011-01-22 DIAGNOSIS — E7800 Pure hypercholesterolemia, unspecified: Secondary | ICD-10-CM | POA: Diagnosis not present

## 2011-01-22 DIAGNOSIS — Z01810 Encounter for preprocedural cardiovascular examination: Secondary | ICD-10-CM | POA: Diagnosis not present

## 2011-01-22 DIAGNOSIS — R0989 Other specified symptoms and signs involving the circulatory and respiratory systems: Secondary | ICD-10-CM

## 2011-01-22 DIAGNOSIS — R0609 Other forms of dyspnea: Secondary | ICD-10-CM | POA: Diagnosis not present

## 2011-01-22 DIAGNOSIS — R0602 Shortness of breath: Secondary | ICD-10-CM

## 2011-01-22 DIAGNOSIS — R0600 Dyspnea, unspecified: Secondary | ICD-10-CM

## 2011-01-22 DIAGNOSIS — R9430 Abnormal result of cardiovascular function study, unspecified: Secondary | ICD-10-CM | POA: Diagnosis not present

## 2011-01-22 DIAGNOSIS — I10 Essential (primary) hypertension: Secondary | ICD-10-CM | POA: Diagnosis not present

## 2011-01-22 LAB — BASIC METABOLIC PANEL
BUN: 18 mg/dL (ref 6–23)
CO2: 27 mEq/L (ref 19–32)
Calcium: 9.3 mg/dL (ref 8.4–10.5)
Chloride: 102 mEq/L (ref 96–112)
Creatinine, Ser: 0.8 mg/dL (ref 0.4–1.2)
GFR: 71.18 mL/min (ref >60.00)
Glucose, Bld: 105 mg/dL — ABNORMAL HIGH (ref 70–99)
Potassium: 4.4 mEq/L (ref 3.5–5.1)
Sodium: 138 mEq/L (ref 135–145)

## 2011-01-22 LAB — CBC WITH DIFFERENTIAL/PLATELET
Basophils Absolute: 0.1 10*3/uL (ref 0.0–0.1)
Basophils Relative: 0.7 % (ref 0.0–3.0)
Eosinophils Absolute: 0.2 10*3/uL (ref 0.0–0.7)
Eosinophils Relative: 2.3 % (ref 0.0–5.0)
HCT: 36.8 % (ref 36.0–46.0)
Hemoglobin: 12.3 g/dL (ref 12.0–15.0)
Lymphocytes Relative: 24.3 % (ref 12.0–46.0)
Lymphs Abs: 2.2 10*3/uL (ref 0.7–4.0)
MCHC: 33.5 g/dL (ref 30.0–36.0)
MCV: 90.3 fl (ref 78.0–100.0)
Monocytes Absolute: 1 10*3/uL (ref 0.1–1.0)
Monocytes Relative: 10.9 % (ref 3.0–12.0)
Neutro Abs: 5.5 10*3/uL (ref 1.4–7.7)
Neutrophils Relative %: 61.8 % (ref 43.0–77.0)
Platelets: 227 10*3/uL (ref 150.0–400.0)
RBC: 4.07 Mil/uL (ref 3.87–5.11)
RDW: 15.5 % — ABNORMAL HIGH (ref 11.5–14.6)
WBC: 7.5 10*3/uL (ref 4.5–10.5)

## 2011-01-22 LAB — PROTIME-INR
INR: 1 ratio (ref 0.8–1.0)
Prothrombin Time: 11.1 s (ref 10.2–12.4)

## 2011-01-22 NOTE — Progress Notes (Signed)
HPI: Pleasant female with no prior cardiac history I initially saw in Dec 2012 for evalation of dyspnea. Chest xray 11-05-10 showed chronic bronchitic changes. She does have obstructive sleep apnea but has not tolerated CPAP. Myoview in December of 2012 showed an ejection fraction of 49%. There was a small partially reversible distal anterior defect suggestive of soft tissue attenuation and mild ischemia. Echocardiogram in December of 2012 showed an ejection fraction of 99991111, grade 1 diastolic dysfunction and mild left atrial enlargement. BNP was normal. Since last saw her she continues to have dyspnea on exertion but no orthopnea, PND, pedal edema or chest pain.   Current Outpatient Prescriptions  Medication Sig Dispense Refill  . albuterol (VENTOLIN HFA) 108 (90 BASE) MCG/ACT inhaler Inhale 2 puffs into the lungs every 6 (six) hours as needed.        . cetirizine (ZYRTEC) 10 MG chewable tablet Chew 1 tablet (10 mg total) by mouth daily.  30 tablet  11  . Cholecalciferol (VITAMIN D PO) Take by mouth daily.        . fluticasone (FLONASE) 50 MCG/ACT nasal spray Place 2 sprays into the nose 2 (two) times daily.  16 g  12  . folic acid (FOLVITE) 1 MG tablet Take 1 mg by mouth daily.        Marland Kitchen inFLIXimab (REMICADE) 100 MG injection As directed every 5 weeks      . olmesartan (BENICAR) 40 MG tablet Take 40 mg by mouth daily.        . simvastatin (ZOCOR) 20 MG tablet Take 20 mg by mouth at bedtime.        Marland Kitchen SPIRIVA HANDIHALER 18 MCG inhalation capsule USE 1 CAPSULE ONCE DAILY  30 capsule  2  . venlafaxine (EFFEXOR-XR) 75 MG 24 hr capsule Take 75 mg by mouth daily.           Past Medical History  Diagnosis Date  . Hypertension   . Rheumatoid arthritis   . Osteoporosis   . Allergic rhinitis   . Hyperlipidemia   . Obesity   . OSA (obstructive sleep apnea)   . Osteoarthritis   . Uterine cancer     Past Surgical History  Procedure Date  . Vesicovaginal fistula closure w/ tah 1971  .  Tonsillectomy     History   Social History  . Marital Status: Divorced    Spouse Name: N/A    Number of Children: 2  . Years of Education: N/A   Occupational History  . counselor   .     Social History Main Topics  . Smoking status: Former Smoker -- 1.0 packs/day for 35 years    Types: Cigarettes    Quit date: 01/06/1990  . Smokeless tobacco: Not on file  . Alcohol Use: No  . Drug Use: Not on file  . Sexually Active: Not on file   Other Topics Concern  . Not on file   Social History Narrative  . No narrative on file    ROS: no fevers or chills, productive cough, hemoptysis, dysphasia, odynophagia, melena, hematochezia, dysuria, hematuria, rash, seizure activity, orthopnea, PND, pedal edema, claudication. Remaining systems are negative.  Physical Exam: Well-developed obese in no acute distress.  Skin is warm and dry.  HEENT is normal.  Neck is supple. No thyromegaly.  Chest is clear to auscultation with normal expansion.  Cardiovascular exam is regular rate and rhythm.  Abdominal exam nontender or distended. No masses palpated. Extremities show no edema. neuro  grossly intact

## 2011-01-22 NOTE — Patient Instructions (Signed)
Your physician recommends that you schedule a follow-up appointment in: 4 WEEKS  Your physician has requested that you have a cardiac catheterization. Cardiac catheterization is used to diagnose and/or treat various heart conditions. Doctors may recommend this procedure for a number of different reasons. The most common reason is to evaluate chest pain. Chest pain can be a symptom of coronary artery disease (CAD), and cardiac catheterization can show whether plaque is narrowing or blocking your heart's arteries. This procedure is also used to evaluate the valves, as well as measure the blood flow and oxygen levels in different parts of your heart. For further information please visit www.cardiosmart.org. Please follow instruction sheet, as given.   Your physician recommends that you return for lab work in: TODAY   

## 2011-01-22 NOTE — Assessment & Plan Note (Signed)
Continue present blood pressure medications. 

## 2011-01-22 NOTE — Assessment & Plan Note (Addendum)
Patient continues with dyspnea on exertion. Etiology remains unclear. There is most likely a component of COPD and OHS, OSA. Her Myoview suggest possible soft tissue attenuation and possible anterior ischemia. Given the persistence of symptoms we will arrange right and left cardiac catheterization to exclude coronary disease. This will also help understand right heart pressures and rule out pulmonary hypertension. The risks and benefits of cardiac catheterization were discussed and the patient agrees to proceed. Note pulmonary is treating her COPD. She has never tolerated CPAP.

## 2011-01-22 NOTE — Assessment & Plan Note (Signed)
Continue statin. Management per primary care. 

## 2011-01-23 ENCOUNTER — Other Ambulatory Visit: Payer: Self-pay | Admitting: Cardiology

## 2011-01-23 DIAGNOSIS — R9430 Abnormal result of cardiovascular function study, unspecified: Secondary | ICD-10-CM

## 2011-01-23 MED ORDER — SODIUM CHLORIDE 0.9 % IV SOLN: 250.0000 mL | INTRAVENOUS | Status: DC | PRN

## 2011-01-23 MED ORDER — SODIUM CHLORIDE 0.9 % IJ SOLN: 3.0000 mL | Freq: Two times a day (BID) | INTRAMUSCULAR | Status: DC

## 2011-01-23 MED ORDER — SODIUM CHLORIDE 0.9 % IJ SOLN: 3.0000 mL | INTRAMUSCULAR | Status: DC | PRN

## 2011-01-24 ENCOUNTER — Ambulatory Visit: Payer: Medicare Other | Admitting: Cardiology

## 2011-01-27 ENCOUNTER — Inpatient Hospital Stay (HOSPITAL_BASED_OUTPATIENT_CLINIC_OR_DEPARTMENT_OTHER)
Admission: RE | Admit: 2011-01-27 | Discharge: 2011-01-27 | Disposition: A | Discharge status: Hospice | Payer: Medicare Other | Source: Ambulatory Visit | Attending: Cardiovascular Disease | Admitting: Cardiovascular Disease

## 2011-01-27 ENCOUNTER — Encounter (HOSPITAL_BASED_OUTPATIENT_CLINIC_OR_DEPARTMENT_OTHER)
Admission: RE | Disposition: A | Discharge status: Hospice | Payer: Self-pay | Source: Ambulatory Visit | Attending: Cardiovascular Disease

## 2011-01-27 DIAGNOSIS — I2510 Atherosclerotic heart disease of native coronary artery without angina pectoris: Secondary | ICD-10-CM | POA: Diagnosis not present

## 2011-01-27 DIAGNOSIS — R0602 Shortness of breath: Secondary | ICD-10-CM

## 2011-01-27 DIAGNOSIS — I2789 Other specified pulmonary heart diseases: Secondary | ICD-10-CM | POA: Insufficient documentation

## 2011-01-27 DIAGNOSIS — R9430 Abnormal result of cardiovascular function study, unspecified: Secondary | ICD-10-CM

## 2011-01-27 HISTORY — PX: CARDIAC CATHETERIZATION: SHX172

## 2011-01-27 SURGERY — JV LEFT AND RIGHT HEART CATHETERIZATION WITH CORONARY ANGIOGRAM
Anesthesia: Moderate Sedation

## 2011-01-27 MED ORDER — SODIUM CHLORIDE 0.9 % IV SOLN: 1.0000 mL/kg/h | INTRAVENOUS | Status: DC

## 2011-01-27 MED ORDER — ASPIRIN 81 MG PO CHEW
324.0000 mg | CHEWABLE_TABLET | ORAL | Status: AC
  Administered 2011-01-27: 324 mg via ORAL

## 2011-01-27 MED ORDER — ACETAMINOPHEN 325 MG PO TABS: 650.0000 mg | ORAL_TABLET | ORAL | Status: DC | PRN

## 2011-01-27 MED ORDER — DIAZEPAM 5 MG PO TABS
5.0000 mg | ORAL_TABLET | ORAL | Status: AC
  Administered 2011-01-27: 5 mg via ORAL

## 2011-01-27 MED ORDER — ONDANSETRON HCL 4 MG/2ML IJ SOLN: 4.0000 mg | Freq: Four times a day (QID) | INTRAMUSCULAR | Status: DC | PRN

## 2011-01-27 NOTE — Procedures (Signed)
Cardiac Catheterization Procedure Note  Name: Taylor Berry MRN: [id number] DOB: 1940/02/07  Procedure: Right Heart Cath, Left Heart Cath, Selective Coronary Angiography, LV angiography  Indication: Shortness of breath   Procedural Details: The right groin was prepped, draped, and anesthetized with 1% lidocaine. Using the modified Seldinger technique a 4 French sheath was placed in the right femoral artery and a 6 French sheath was placed in the right femoral vein. A multipurpose catheter was used for the right heart catheterization. Standard protocol was followed for recording of right heart pressures and sampling of oxygen saturations. Fick cardiac output was calculated. Standard Judkins catheters were used for selective coronary angiography and left ventriculography. There were no immediate procedural complications. The patient was transferred to the post catheterization recovery area for further monitoring.  Procedural Findings: Hemodynamics RA 10 RV 45/16 PA 40/16 with a mean of 26 PCWP 13 LV 146/21 AO 145/75 with a mean of 106  Oxygen saturations: PA 51 AO 88 SVC 63  Cardiac Output (Fick) 4.4  Cardiac Index (Fick) 2.0   Coronary angiography: Coronary dominance: right  Left mainstem: The left main stem is widely patent. There is no obstructive disease.  Left anterior descending (LAD): The LAD is widely patent in its proximal aspect. The first diagonal branch is a large caliber vessel that is widely patent. The LAD is patent to the distal anterior wall. The LAD just beyond the diagonal branch has minor nonobstructive plaque.  Left circumflex (LCx): The AV groove circumflex is normal in appearance. It is a small vessel. It gives off a high first OM with no significant stenosis the  Right coronary artery (RCA): The RCA is a large, dominant vessel. It supplies a PDA branch and 2 posterolateral branches without significant disease.  Left ventriculography: Left ventricular  systolic function is normal, LVEF is estimated at 55-65%, there is no significant mitral regurgitation   Final Conclusions:   1. Minor nonobstructive coronary artery disease without significant stenosis. 2. Normal left ventricle systolic function 3. Elevated right heart pressures with mild pulmonary hypertension  Recommendations: The patient has no significant left-sided heart disease. She does have mild pulmonary hypertension (PVR 3 Woods units).    [name] 01/27/2011, 12:12 PM

## 2011-01-27 NOTE — OR Nursing (Signed)
Bedrest 1245   MRW

## 2011-01-27 NOTE — OR Nursing (Signed)
Discharge instructions reviewed and signed, pt stated understanding, ambulated in hall without difficulty, site level 0, transported to son's car via wheelchair 

## 2011-01-27 NOTE — Interval H&P Note (Signed)
History and Physical Interval Note:  01/27/2011 11:38 AM  Taylor Berry  has presented today for surgery, with the diagnosis of ann nuclear stress test  The various methods of treatment have been discussed with the patient and family. After consideration of risks, benefits and other options for treatment, the patient has consented to  Procedure(s): JV LEFT AND RIGHT HEART CATHETERIZATION WITH CORONARY ANGIOGRAM as a surgical intervention .  The patients' history has been reviewed, patient examined, no change in status, stable for surgery.  I have reviewed the patients' chart and labs.  Questions were answered to the patient's satisfaction.     [name]

## 2011-01-27 NOTE — Progress Notes (Signed)
Pt arrival time @ 1230

## 2011-01-27 NOTE — OR Nursing (Signed)
Dr Cooper at bedside to discuss results and treatment plan with pt and family 

## 2011-01-27 NOTE — H&P (View-Only) (Signed)
HPI: Pleasant female with no prior cardiac history I initially saw in Dec 2012 for evalation of dyspnea. Chest xray 11-05-10 showed chronic bronchitic changes. She does have obstructive sleep apnea but has not tolerated CPAP. Myoview in December of 2012 showed an ejection fraction of 49%. There was a small partially reversible distal anterior defect suggestive of soft tissue attenuation and mild ischemia. Echocardiogram in December of 2012 showed an ejection fraction of 99991111, grade 1 diastolic dysfunction and mild left atrial enlargement. BNP was normal. Since last saw her she continues to have dyspnea on exertion but no orthopnea, PND, pedal edema or chest pain.   Current Outpatient Prescriptions  Medication Sig Dispense Refill  . albuterol (VENTOLIN HFA) 108 (90 BASE) MCG/ACT inhaler Inhale 2 puffs into the lungs every 6 (six) hours as needed.        . cetirizine (ZYRTEC) 10 MG chewable tablet Chew 1 tablet (10 mg total) by mouth daily.  30 tablet  11  . Cholecalciferol (VITAMIN D PO) Take by mouth daily.        . fluticasone (FLONASE) 50 MCG/ACT nasal spray Place 2 sprays into the nose 2 (two) times daily.  16 g  12  . folic acid (FOLVITE) 1 MG tablet Take 1 mg by mouth daily.        Marland Kitchen inFLIXimab (REMICADE) 100 MG injection As directed every 5 weeks      . olmesartan (BENICAR) 40 MG tablet Take 40 mg by mouth daily.        . simvastatin (ZOCOR) 20 MG tablet Take 20 mg by mouth at bedtime.        Marland Kitchen SPIRIVA HANDIHALER 18 MCG inhalation capsule USE 1 CAPSULE ONCE DAILY  30 capsule  2  . venlafaxine (EFFEXOR-XR) 75 MG 24 hr capsule Take 75 mg by mouth daily.           Past Medical History  Diagnosis Date  . Hypertension   . Rheumatoid arthritis   . Osteoporosis   . Allergic rhinitis   . Hyperlipidemia   . Obesity   . OSA (obstructive sleep apnea)   . Osteoarthritis   . Uterine cancer     Past Surgical History  Procedure Date  . Vesicovaginal fistula closure w/ tah 1971  .  Tonsillectomy     History   Social History  . Marital Status: Divorced    Spouse Name: N/A    Number of Children: 2  . Years of Education: N/A   Occupational History  . counselor   .     Social History Main Topics  . Smoking status: Former Smoker -- 1.0 packs/day for 35 years    Types: Cigarettes    Quit date: 01/06/1990  . Smokeless tobacco: Not on file  . Alcohol Use: No  . Drug Use: Not on file  . Sexually Active: Not on file   Other Topics Concern  . Not on file   Social History Narrative  . No narrative on file    ROS: no fevers or chills, productive cough, hemoptysis, dysphasia, odynophagia, melena, hematochezia, dysuria, hematuria, rash, seizure activity, orthopnea, PND, pedal edema, claudication. Remaining systems are negative.  Physical Exam: Well-developed obese in no acute distress.  Skin is warm and dry.  HEENT is normal.  Neck is supple. No thyromegaly.  Chest is clear to auscultation with normal expansion.  Cardiovascular exam is regular rate and rhythm.  Abdominal exam nontender or distended. No masses palpated. Extremities show no edema. neuro  grossly intact

## 2011-01-28 LAB — POCT I-STAT 3, ART BLOOD GAS (G3+)
Acid-base deficit: 1 mmol/L (ref 0.0–2.0)
Bicarbonate: 24.8 mEq/L — ABNORMAL HIGH (ref 20.0–24.0)
O2 Saturation: 88 %
TCO2: 26 mmol/L (ref 0–100)
pCO2 arterial: 45.4 mmHg — ABNORMAL HIGH (ref 35.0–45.0)
pH, Arterial: 7.345 — ABNORMAL LOW (ref 7.350–7.400)
pO2, Arterial: 59 mmHg — ABNORMAL LOW (ref 80.0–100.0)

## 2011-01-28 LAB — POCT I-STAT 3, VENOUS BLOOD GAS (G3P V)
Bicarbonate: 26.2 mEq/L — ABNORMAL HIGH (ref 20.0–24.0)
Bicarbonate: 26.7 mEq/L — ABNORMAL HIGH (ref 20.0–24.0)
O2 Saturation: 51 %
O2 Saturation: 63 %
TCO2: 28 mmol/L (ref 0–100)
TCO2: 28 mmol/L (ref 0–100)
pCO2, Ven: 50 mmHg (ref 45.0–50.0)
pCO2, Ven: 52.7 mmHg — ABNORMAL HIGH (ref 45.0–50.0)
pH, Ven: 7.312 — ABNORMAL HIGH (ref 7.250–7.300)
pH, Ven: 7.327 — ABNORMAL HIGH (ref 7.250–7.300)
pO2, Ven: 30 mmHg (ref 30.0–45.0)
pO2, Ven: 36 mmHg (ref 30.0–45.0)

## 2011-02-03 ENCOUNTER — Ambulatory Visit (INDEPENDENT_AMBULATORY_CARE_PROVIDER_SITE_OTHER): Payer: Medicare Other | Admitting: Emergency Medicine

## 2011-02-03 ENCOUNTER — Encounter: Payer: Self-pay | Admitting: Emergency Medicine

## 2011-02-03 VITALS — BP 160/98 | HR 101 | Temp 97.4°F | Ht 63.0 in | Wt 262.4 lb

## 2011-02-03 DIAGNOSIS — R0600 Dyspnea, unspecified: Secondary | ICD-10-CM

## 2011-02-03 DIAGNOSIS — R0609 Other forms of dyspnea: Secondary | ICD-10-CM

## 2011-02-03 DIAGNOSIS — R0989 Other specified symptoms and signs involving the circulatory and respiratory systems: Secondary | ICD-10-CM | POA: Diagnosis not present

## 2011-02-03 NOTE — Patient Instructions (Signed)
We will refer you for nutrition evaluation and weight loss planning We will set up a CPAP titration study Follow with Dr Delton Coombes in 3 months

## 2011-02-03 NOTE — Assessment & Plan Note (Signed)
I agree that COPD is not a big part of this, seems to be slightly better on Spiriva. Now we have reassuring cardiac eval. Suspect that OSA and obesity, deconditioning are biggest contributors here. She is willing to go back to sleep lab. - refer CPAP/BiPAP titration study - nutrition consult - rov in 3 months.

## 2011-02-03 NOTE — Progress Notes (Signed)
71 yo woman, former smoker, with obesity, HTN, RA, allergic rhinitis, OSA dx by PSG not currently on CPAP due to intolerance of high pressure. Referred for evaluation for chronic cough.   Has been seen for chronic cough by Dr Lady Deutscher for cough over the last 2 yrs. Prior to that she did not cough frequently, only with URIs. She reports persistent cough for about 2 yrs, usuallly non-prod but sometimes white to clear, appears to be worse during the winter months. Has also had PND and allergies that have remained bothersome. Was tried on zyrtec, also on Advair, QVAR at one point, not on these currently. Was treated for the last 2 months empirically for GERD with lansoprasole, off it now. She sometimes wakes at night with cough, PND. No real voice changes, some globus sensation and throat clearing.   ROV 07/20/09 -- returns for cough and exertional dyspnea. We started loratadine + fluticasone + NSWs, also omeprazole. Cough is better, still with nasal congestion and some gtt.   ROV 11/22/10 -- Seen previously for dyspnea and chronic cough.Also hx untreated OSA.  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 Her cough got better with rx PND and GERD, she was seen by Dr Wilburn Cornelia - eval consistent w GERD related cough.  Current regimen = omeprazole (stopped fluticasone and loratadine). Her cough is better now, she believes that stopping Ca has made this better. Still has a little cough.  Her biggest complaint is progressive exertional dyspnea since July '12. Notices it with walking. Her eval has included clear CXR's, normal ECG.  She had a stress test in 2002 (?). Her wt has been stable.  She was told to go back on QVAR 2 weeks ago -> no change in her SOB. Has not used SABA.  She still believes that she cannot do CPAP due to the high required pressures. She has never seen a cardiologist formally, interested in seeing Dr Stanford Breed.   ROV 12/27/10 -- Returns for evaluation exertional  SOB in setting mixed disease, OSA (not on CPAP). We started Spiriva about 6 weeks ago, but she isn't sure that it has really helped. Maybe a small benefit in that it takes shorter time to recover. Use albuterol prn and finds that this helps her more. She saw Dr Stanford Breed as planned, underwent stress testing as below. Cough is stable, no better or worse.   Nuclear stress testing 12/20 by Dr Stanford Breed showed abnormal stress nuclear study with a small partially reversible distal anterior defect suggestive of soft tissue attenuation and mild ischemia.  ROV 02/03/11 -- OSA (not on CPAP), mild AFL with marginal response to BD, diastolic dysfxn (by TTE 99991111). Followed by Dr Stanford Breed, had a Myoview w possible evidence anterior ischemia. Underwent R and L heart cath on 1/23. RVP 46/8/16, PAP 40/16/26, PAOP 18/15/13.  I can't find the L cath results, but pt tells me that she was told that she didn't have any coronary occlusions. She is willing to go back to the sleep lab for titration and mask fitting. Still taking Spiriva, may be helping some but remains SOB. She isn't sure that the financial cost is worth the benefit.    EXAM:  Gen: Pleasant, well-nourished, in no distress,  normal affect  ENT: No lesions,  mouth clear,  oropharynx clear, no postnasal drip  Neck: No JVD, no TMG, no carotid bruits  Lungs: No use of accessory muscles, no dullness to percussion, clear without rales or  rhonchi  Cardiovascular: RRR, heart sounds normal, no murmur or gallops, no peripheral edema  Musculoskeletal: No deformities, no cyanosis or clubbing  Neuro: alert, non focal  Skin: Warm, no lesions or rashes   Pulmonary Function Test  Date: 07/20/2009  Height (in.): 64  Gender: Female  Pre-Spirometry  FVC Value: 1.93 L/min Pred: 2.88 L/min % Pred: 67 %  FEV1 Value: 1.31 L Pred: 2.06 L % Pred: 64 %  FEV1/FVC Value: 68 % Pred: 72 % FEF 25-75 Value: 0.86 L/min Pred: 2.35 L/min % Pred: 36 %  Post-Spirometry  FVC  Value: 1.82 L/min Pred: 2.88 L/min % Pred: 63 %  FEV1 Value: 1.24 L Pred: 2.06 L % Pred: 60 %  FEV1/FVC Value: 68 % Pred: 72 % FEF 25-75 Value: 0.74 L/min Pred: 2.35 L/min % Pred: 31 %  Lung Volumes  TLC Value: 3.49 L % Pred: 72 %  RV Value: 1.55 L % Pred: 81 %  DLCO Value: 15.4 % % Pred: 58 %  DLCO/VA Value: 5.38 % % Pred: 151 %  Comments:  Mild to mod AFL, no BD response, restricted volumes, DLCO corrects to normal range for Va. RSV    Dyspnea on exertion I agree that COPD is not a big part of this, seems to be slightly better on Spiriva. Now we have reassuring cardiac eval. Suspect that OSA and obesity, deconditioning are biggest contributors here. She is willing to go back to sleep lab. - refer CPAP/BiPAP titration study - nutrition consult - rov in 3 months.

## 2011-02-13 ENCOUNTER — Encounter: Payer: Medicare Other | Attending: Emergency Medicine | Admitting: Dietician

## 2011-02-13 ENCOUNTER — Encounter: Payer: Self-pay | Admitting: Dietician

## 2011-02-13 DIAGNOSIS — E669 Obesity, unspecified: Secondary | ICD-10-CM | POA: Diagnosis not present

## 2011-02-13 DIAGNOSIS — E7800 Pure hypercholesterolemia, unspecified: Secondary | ICD-10-CM | POA: Diagnosis not present

## 2011-02-13 DIAGNOSIS — Z713 Dietary counseling and surveillance: Secondary | ICD-10-CM | POA: Diagnosis not present

## 2011-02-13 DIAGNOSIS — I10 Essential (primary) hypertension: Secondary | ICD-10-CM

## 2011-02-13 NOTE — Progress Notes (Signed)
Medical Nutrition Therapy:  Appt start time: 0830 end time:  0930.  Assessment:  Primary concerns today: Increasing shortness of breath with limited exertion.  She agrees with her MD that the weight and the deconditioning is most likely the cause of her issues with breathing.  This AM she became SOB just walking the short distance from the scales in our office to the Secretary's desk (Approx 60 feet).  She has lost 3.1 lb since her visit with Dr. Solon Augusta on 02/03/11.  She provides a history of 30 lb weight loss using the diet "[employer]."  She stopped the diet because of the financial cost involved with the regular visits and the purchase of the supplements.  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  She has done some homework in planning to incorporate some activity into her schedule.  She is retired but does work 1 day out of a two week pay period at [employer] on the weekend.   She arrived a little later for her appointment and we ran a little over the appointment time.  She had scheduled another appointment at 10:00 AM.  I have asked her to plan to follow back up with me in two weeks so that we can establish a diet prescription and a plan for her weight loss.   MEDICATIONS: A number of medications for breathing/respiratory issues, Remicade for the RA, HTN medication, Cholesterol lowering medications.  DIETARY INTAKE:  24-hr recall:  B (7:30  AM): Coffee, regular 16 oz.  Black Bowl of oatmeal  Rolled oats with raisins 1 cup, handful.OR toast 2, whole grain bread, jelly sugar free or regular, butter, bacon pre-cooked 1-2 slices, OR grits, egg, toast and bacon but not usually. (once every 2 weeks) Snk (mid AM) :no  L (1:00 PM): cottage cheese (1 C) with mandarian oranges 1/3 cup. water Snk (mid PM): Not really.  If snacking will use grapes or a handful of the small carrots. D (6:00ish  PM): Healthy Choice froz dinner [facility] Steak, apples, green beans, roasted potatoes.) Chi tea. (if cooking can have a large portion) Snk (Bedtime PM):  Beverages: coffee, water 3 X 16 oz, green tea.  Recent physical activity: Currently no established exercise.  Has decided to try the Chair Exercise Classes at the [facility].  The classes are for 60 minutes 3 mornings per week and are free.  Estimated energy needs:HT: 63 in  WT: 258.9 lb BMI: 46%  Adj WT: 161 lb (73 kg)  1300 calories 125-1301g carbohydrates 110-115 g protein 34-36 g fat  Progress Towards Goal(s):  In progress.   Nutritional Diagnosis:  [place]-3.3 Overweight/obesity As related to excessive starchy carbohydrate intake and limited to no physical activity.  As evidenced by Weight of 258.9 lb, BMI 46%, and marked SOB with activity.    Intervention:  Nutrition Approach the weight loss process with decreasing carb intake but not totally eliminating all carbohydrates from the diet.  Needs to include the more complex carbs and restrict portions.  Will look to help find the protein shake that she feels was an asset in her last program.  Her program needs to be one that does not entail a great deal of cooking and meal prep.  She becomes SOB and tires while trying to shop and prepare a meal.  Will need to address issues related to decreasing sodium and  fat (saturated) intake.    Try the chair program at the [facility].  Aim to start low and slow and build up.  Get the 1  lb to 2 lb weights to start your exercise.  Aim for the 3 days per week.  Then, later consider [facility].   Free Vegetables:  1-2 servings at lunch and dinner.  I will mail you the meal plan suggestions.  Plan to follow-up with me in 2 weeks.  Call or e-mail with questions.  Call the [phone] for appt.   Handouts given during visit include:  Novo Nordisk Carb Counting Guide for the detailed non-starchy  vegetable list.  Monitoring/Evaluation:  Dietary intake, exercise,  and body weight in the next two weeks.

## 2011-02-13 NOTE — Patient Instructions (Addendum)
   Try the chair program at the [facility].  Aim to start low and slow and build up.  Get the 1  lb to 2 lb weights to start your exercise.  Aim for the 3 days per week.  Then, later consider [facility].   Free Vegetables:  1-2 servings at lunch and dinner.  I will mail you the meal plan suggestions.  Plan to follow-up with me in 2 weeks.  Call or e-mail with questions.  Call the [phone] for appt.

## 2011-02-17 ENCOUNTER — Encounter: Payer: Self-pay | Admitting: Physician Assistant

## 2011-02-17 ENCOUNTER — Ambulatory Visit (INDEPENDENT_AMBULATORY_CARE_PROVIDER_SITE_OTHER): Payer: Medicare Other | Admitting: Physician Assistant

## 2011-02-17 VITALS — BP 122/78 | HR 91 | Ht 63.0 in | Wt 259.0 lb

## 2011-02-17 DIAGNOSIS — G4733 Obstructive sleep apnea (adult) (pediatric): Secondary | ICD-10-CM

## 2011-02-17 DIAGNOSIS — I2789 Other specified pulmonary heart diseases: Secondary | ICD-10-CM

## 2011-02-17 DIAGNOSIS — R0600 Dyspnea, unspecified: Secondary | ICD-10-CM

## 2011-02-17 DIAGNOSIS — R0609 Other forms of dyspnea: Secondary | ICD-10-CM

## 2011-02-17 DIAGNOSIS — R0989 Other specified symptoms and signs involving the circulatory and respiratory systems: Secondary | ICD-10-CM | POA: Diagnosis not present

## 2011-02-17 DIAGNOSIS — I272 Pulmonary hypertension, unspecified: Secondary | ICD-10-CM | POA: Insufficient documentation

## 2011-02-17 DIAGNOSIS — R079 Chest pain, unspecified: Secondary | ICD-10-CM | POA: Diagnosis not present

## 2011-02-17 NOTE — Progress Notes (Signed)
[place] [place], [place]  [address] Phone: [phone] Fax:  [phone]  Date:  02/17/2011   Name:  Taylor Berry       DOB:  Oct 06, 1940 MRN:  [id number]  PCP:  Dr. Laurann Montana Primary Cardiologist:  Dr. Kirk Ruths  Primary Electrophysiologist:  None    History of Present Illness: Taylor Berry is a 71 y.o. female who presents for post cath follow up.  She was originally evaluated by Dr. Kirk Ruths in 12/2010 for dyspnea.  Chest xray 11-05-10 showed chronic bronchitic changes. She does have obstructive sleep apnea but has not tolerated CPAP. Myoview in December of 2012 showed an ejection fraction of 49%. There was a small partially reversible distal anterior defect suggestive of soft tissue attenuation and mild ischemia. Echocardiogram in December of 2012 showed an ejection fraction of 99991111, grade 1 diastolic dysfunction and mild left atrial enlargement. BNP was normal.  Last seen by Dr. Kirk Ruths 01/22/11 and given her abnormal stress test and ongoing symptoms, L+R Heart cath was arranged.  This was done by Dr. Sherren Mocha on 1/21 with results below.  It demonstrated minimal plaque, normal LVF and mild pulmonary HTN.  She has seen Dr. Lamonte Sakai in follow up and she is to have CPAP titration study.   She continues to have dyspnea with exertion.  No chest pain or syncope.  She chronically sleeps at an incline.  She met with the nutritionist and is trying to lose weigh.  Past Medical History  Diagnosis Date  . Hypertension   . Rheumatoid arthritis   . Osteoporosis   . Allergic rhinitis   . Hyperlipidemia   . Obesity   . OSA (obstructive sleep apnea)   . Osteoarthritis   . Uterine cancer     Current Outpatient Prescriptions  Medication Sig Dispense Refill  . albuterol (VENTOLIN HFA) 108 (90 BASE) MCG/ACT inhaler Inhale 2 puffs into the lungs every 6 (six) hours as needed.        . cetirizine (ZYRTEC) 10 MG tablet Take 10 mg by mouth  daily.      . Cholecalciferol (VITAMIN D PO) Take by mouth daily.        . fluticasone (FLONASE) 50 MCG/ACT nasal spray Place 2 sprays into the nose 2 (two) times daily as needed.      . folic acid (FOLVITE) 1 MG tablet Take 1 mg by mouth daily.        Marland Kitchen inFLIXimab (REMICADE) 100 MG injection As directed every 5 weeks      . olmesartan (BENICAR) 40 MG tablet Take 40 mg by mouth daily.        . simvastatin (ZOCOR) 20 MG tablet Take 20 mg by mouth at bedtime.        Marland Kitchen SPIRIVA HANDIHALER 18 MCG inhalation capsule USE 1 CAPSULE ONCE DAILY  30 capsule  2  . venlafaxine (EFFEXOR-XR) 75 MG 24 hr capsule Take 75 mg by mouth daily.         Current Facility-Administered Medications  Medication Dose Route Frequency Provider Last Rate Last Dose  . 0.9 %  sodium chloride infusion  250 mL Intravenous PRN [name], MD      . sodium chloride 0.9 % injection 3 mL  3 mL Intravenous Q12H [name], MD      . sodium chloride 0.9 % injection 3 mL  3 mL Intravenous PRN [name], MD  Allergies: Allergies  Allergen Reactions  . Ace Inhibitors     REACTION: cough  . Hydrochlorothiazide     REACTION: decreased sodium  . Latex     REACTION: itching  . Penicillins     REACTION: intolerant due to yeast infection    History  Substance Use Topics  . Smoking status: Former Smoker -- 1.0 packs/day for 35 years    Types: Cigarettes    Quit date: 01/06/1990  . Smokeless tobacco: Never Used  . Alcohol Use: No     PHYSICAL EXAM: VS:  BP 122/78  Pulse 91  Ht '5\' 3"'$  (1.6 m)  Wt 259 lb (117.482 kg)  BMI 45.88 kg/m2 Well nourished, well developed, in no acute distress HEENT: normal Neck: no JVD Cardiac:  normal S1, S2; RRR; no murmur Lungs:  clear to auscultation bilaterally, no wheezing, rhonchi or rales Abd: soft, nontender, no hepatomegaly Ext: no edema; right groin without hematoma or bruit  Skin: warm and dry Neuro:  CNs 2-12 intact, no focal abnormalities noted  EKG:   NSR, HR 91, inc LBBB  Right and Left Cardiac Catheterization 01/27/11: Hemodynamics - RA 10, RV 45/16, PA 40/16 with a mean of 26, PCWP 13, LV 146/21, AO 145/75 with a mean of 106  Oxygen saturations: PA 51, AO 88, SVC 63  Cardiac Output (Fick) 4.4  Cardiac Index (Fick) 2.0  Coronary angiography:  Coronary dominance: right  Left mainstem: The left main stem is widely patent. There is no obstructive disease.  Left anterior descending (LAD): The LAD is widely patent in its proximal aspect. The first diagonal branch is a large caliber vessel that is widely patent. The LAD is patent to the distal anterior wall. The LAD just beyond the diagonal branch has minor nonobstructive plaque.  Left circumflex (LCx): The AV groove circumflex is normal in appearance. It is a small vessel. It gives off a high first OM with no significant stenosis the  Right coronary artery (RCA): The RCA is a large, dominant vessel. It supplies a PDA branch and 2 posterolateral branches without significant disease.  Left ventriculography: Left ventricular systolic function is normal, LVEF is estimated at 55-65%, there is no significant mitral regurgitation   Final Conclusions:  1. Minor nonobstructive coronary artery disease without significant stenosis.  2. Normal left ventricle systolic function  3. Elevated right heart pressures with mild pulmonary hypertension   Recommendations: The patient has no significant left-sided heart disease. She does have mild pulmonary hypertension (PVR 3 Woods units).    ASSESSMENT AND PLAN:

## 2011-02-17 NOTE — Assessment & Plan Note (Signed)
CPAP titration set for tomorrow.

## 2011-02-17 NOTE — Assessment & Plan Note (Signed)
She continues follow up with Dr. Delton Coombes.

## 2011-02-17 NOTE — Assessment & Plan Note (Signed)
No obstructive CAD noted on LHC.  Continue risk factor modification.  Arrange follow up with Dr. Olga Millers in 3 mos.  Unless there is a change in her condition, I suspect he will likely see her on a PRN basis after that.

## 2011-02-17 NOTE — Patient Instructions (Signed)
Your physician recommends that you schedule a follow-up appointment in: 3 months with Dr. Jens Som  No changes today

## 2011-02-18 ENCOUNTER — Ambulatory Visit (HOSPITAL_BASED_OUTPATIENT_CLINIC_OR_DEPARTMENT_OTHER): Payer: Medicare Other | Attending: Emergency Medicine | Admitting: Radiology

## 2011-02-18 ENCOUNTER — Encounter (HOSPITAL_COMMUNITY)
Admission: RE | Admit: 2011-02-18 | Discharge: 2011-02-18 | Disposition: A | Discharge status: Home / Self Care | Payer: Medicare Other | Source: Ambulatory Visit | Attending: Internal Medicine | Admitting: Internal Medicine

## 2011-02-18 VITALS — Ht 63.0 in | Wt 259.0 lb

## 2011-02-18 DIAGNOSIS — M069 Rheumatoid arthritis, unspecified: Secondary | ICD-10-CM | POA: Diagnosis not present

## 2011-02-18 DIAGNOSIS — G4733 Obstructive sleep apnea (adult) (pediatric): Secondary | ICD-10-CM | POA: Diagnosis not present

## 2011-02-18 DIAGNOSIS — R0609 Other forms of dyspnea: Secondary | ICD-10-CM

## 2011-02-18 DIAGNOSIS — R0600 Dyspnea, unspecified: Secondary | ICD-10-CM

## 2011-02-18 MED ORDER — LORATADINE 10 MG PO TABS: 10.0000 mg | ORAL_TABLET | ORAL | Status: DC

## 2011-02-18 MED ORDER — SODIUM CHLORIDE 0.9 % IV SOLN
5.0000 mg/kg | INTRAVENOUS | Status: DC
  Administered 2011-02-18: 600 mg via INTRAVENOUS
  Filled 2011-02-18: qty 60

## 2011-02-24 ENCOUNTER — Ambulatory Visit: Payer: Medicare Other | Admitting: Emergency Medicine

## 2011-02-26 DIAGNOSIS — G4733 Obstructive sleep apnea (adult) (pediatric): Secondary | ICD-10-CM

## 2011-02-26 NOTE — Procedures (Signed)
NAMESADHANA, FRATER NO.:  [id number]  MEDICAL RECORD NO.:  [id number]          PATIENT TYPE:  OUT  LOCATION:  SLEEP CENTER                 FACILITY:  [facility] [address]  PHYSICIAN:  Barbaraann Share, MD,FCCPDATE OF BIRTH:  [birth year] 07, 1940  DATE OF STUDY:  02/18/2011                           NOCTURNAL POLYSOMNOGRAM  REFERRING PHYSICIAN:  Leslye Peer, MD  REFERRING PHYSICIAN:  Leslye Peer, MD  INDICATION FOR STUDY:  Hypersomnia with sleep apnea.  EPWORTH SCORE:  9.  SLEEP ARCHITECTURE:  The patient had a total sleep time of 329 minutes with decreased slow wave sleep, but adequate REM.  Sleep onset latency was mildly prolonged at 39 minutes and REM onset was prolonged at 137 minutes.  Sleep efficiency was moderately reduced at 80%.  RESPIRATORY DATA:  The patient underwent a CPAP titration study, and was fitted with a small ResMed Quattro FX full face mask beforehand.  Her pressure was initiated at 5 cm of water and was increased through the night in order to control obstructive events as well as snoring.  At a CPAP pressure of 16, she appeared to have fairly good control, however, she then turned to the supine position and began to have more obstructive events.  She was ultimately titrated to a final pressure of 19 cm of water with good control of her events and snoring.  OXYGEN DATA:  There was O2 desaturation as low as 81% until her optimal pressure was reached.  CARDIAC DATA:  Occasional PVC noted but no clinically significant arrhythmias were seen.  MOVEMENT/PARASOMNIA:  No significant leg jerks or other abnormal behaviors noted.  IMPRESSION/RECOMMENDATION: 1. CPAP titration study reveals good control of the patient's     obstructive sleep apnea with an optimal pressure of 19 cm of water,     delivered by a small ResMed Quattro FX full face mask.  If the     patient has difficulty with     pressure tolerance, could consider treatment with bilevel.  Clinical correlation is suggested. 2. Occasional premature ventricular contractions, but no clinically     significant arrhythmias were noted.     Barbaraann Share, MD,FCCP Diplomate, American Board of Sleep Medicine    [facility]/MEDQ  D:  02/26/2011 08:30:53  T:  02/26/2011 09:09:55  Job:  [id number]

## 2011-03-07 ENCOUNTER — Other Ambulatory Visit: Payer: Self-pay | Admitting: Emergency Medicine

## 2011-03-07 DIAGNOSIS — G4733 Obstructive sleep apnea (adult) (pediatric): Secondary | ICD-10-CM

## 2011-03-07 NOTE — Progress Notes (Signed)
Patient needs CPAP 16cm H2O, 19cmH2O when supine. Alternatively could do BiPAP. Mask = small res med quattro Fx. Will order 16cm H2O, consider increasing to 19 in the future.   Lawson Fiscal - Did this get done? The encounter is open, but I sen the order to [facility]

## 2011-03-13 ENCOUNTER — Telehealth: Payer: Self-pay | Admitting: Emergency Medicine

## 2011-03-13 NOTE — Telephone Encounter (Signed)
Pt aware sample of spiriva left up front Lot # [id number] EXp02/14

## 2011-03-18 ENCOUNTER — Ambulatory Visit: Payer: PRIVATE HEALTH INSURANCE | Admitting: Dietician

## 2011-03-25 ENCOUNTER — Encounter (HOSPITAL_COMMUNITY)
Admission: RE | Admit: 2011-03-25 | Discharge: 2011-03-25 | Disposition: A | Discharge status: Home / Self Care | Payer: Medicare Other | Source: Ambulatory Visit | Attending: Internal Medicine | Admitting: Internal Medicine

## 2011-03-25 ENCOUNTER — Telehealth: Payer: Self-pay | Admitting: Emergency Medicine

## 2011-03-25 DIAGNOSIS — M069 Rheumatoid arthritis, unspecified: Secondary | ICD-10-CM | POA: Insufficient documentation

## 2011-03-25 MED ORDER — SODIUM CHLORIDE 0.9 % IV SOLN
5.0000 mg/kg | INTRAVENOUS | Status: AC
  Administered 2011-03-25: 600 mg via INTRAVENOUS
  Filled 2011-03-25: qty 60

## 2011-03-25 MED ORDER — LORATADINE 10 MG PO TABS
10.0000 mg | ORAL_TABLET | ORAL | Status: DC
  Filled 2011-03-25: qty 1

## 2011-03-25 NOTE — Telephone Encounter (Signed)
I ordered CPAP for her on 03/07/11 - need to figure out the status of this.

## 2011-03-25 NOTE — Telephone Encounter (Signed)
I spoke with pt and she is requesting her sleep study results done back in February. Pt states she hasn't heard from anyone. Please advise Dr. Delton Coombes, thanks

## 2011-03-26 ENCOUNTER — Encounter (HOSPITAL_COMMUNITY): Payer: Medicare Other

## 2011-03-26 NOTE — Telephone Encounter (Signed)
I spoke with [facility] and she has not been set up yet bc they are awaiting pt's sleep study results. I have faxed this over to [facility]. Pt is aware of this and that [facility] will be contacting her

## 2011-03-28 NOTE — Progress Notes (Signed)
Yes, order was given to [facility] on 03/07/11. Will close note.

## 2011-04-02 ENCOUNTER — Encounter: Payer: Medicare Other | Attending: Emergency Medicine | Admitting: Dietician

## 2011-04-02 ENCOUNTER — Telehealth: Payer: Self-pay | Admitting: Emergency Medicine

## 2011-04-02 VITALS — Ht 63.0 in | Wt 248.9 lb

## 2011-04-02 DIAGNOSIS — Z713 Dietary counseling and surveillance: Secondary | ICD-10-CM | POA: Insufficient documentation

## 2011-04-02 DIAGNOSIS — E669 Obesity, unspecified: Secondary | ICD-10-CM | POA: Diagnosis not present

## 2011-04-02 DIAGNOSIS — E7800 Pure hypercholesterolemia, unspecified: Secondary | ICD-10-CM | POA: Diagnosis not present

## 2011-04-02 DIAGNOSIS — I10 Essential (primary) hypertension: Secondary | ICD-10-CM | POA: Diagnosis not present

## 2011-04-02 NOTE — Telephone Encounter (Signed)
2 boxes of spiriva left up front for the pt. Spoke with pt and notified that this was done.

## 2011-04-03 ENCOUNTER — Encounter: Payer: Self-pay | Admitting: Dietician

## 2011-04-03 NOTE — Progress Notes (Signed)
  Medical Nutrition Therapy:  Appt start time: 1500 end time:  1530.  Assessment:  Primary concerns today: Comes in today for weight loss follow-up.  Weight today is at 248.9 lb, with a weight loss of 10.0 lb since 03/05/2011.  She was somewhat amazed at the loss since she noted she had not been exercising at all and had been working a lot at [employer] over the last 4-6 weeks.  She did note that she had changed her eating patterns and was eating a regular times and using smaller portions.  She was quite pleased with the results.  She notes that over the last few weeks, the increased working and other life issues had created more stress for her.  Her arthritis is reported to be under control at this time.    MEDICATIONS: Continues her meds as ordered for breathing/respiratory drugs, Remicade, HTN, and cholesterol lowering agent.  DIETARY INTAKE:  24-hr recall:  B (early AM): Kashi cereal, 1/2 cup with milk (skim)  OR a boiled egg  Snk (mis AM) :none  L (mid Day): Large/medium salad with a backed or broiled fish  Snk (mid PM): None D (6:00 PM): Salad or bowl of soup  Snk (HS PM): None Beverages: Water   Recent physical activity: Has not started to exercise.  Her working schedule has limited her free time.  Promises to explore starting chair exercises.  I want to explore the date and times for a class offered by [employer] that she might participate in.   Progress Towards Goal(s):  Some progress.   Nutritional Diagnosis:  [place]-3.3 Overweight/obesity As related to excessive starchy carbohydrete intake in the past and limited physical activity..  As evidenced by Weight of 248.9 lb and BMI of 44.2 kg/m2.    Intervention:  Nutrition Notes that she has omitted sugar (added or obvious sugar) and loaf bread from her diet.  She is eating at regular times.She has started to read labels.  Notes that in the past for weight loss to be effective for her she has to keep her caloric intake to  1200 calories per day.  I had calculated her at 1300 calories.  She will use the menu suggestions, and aim to stay around the 1200 level.  She promises to not drop below 1000 calories.    Handouts given during visit include:  Yellow card with diet prescription and translation of carb servings to calorie values to help with counting calories rather than carbs  Food label which we reviewed  Protein bar suggestions  Protein powder suggestion and possible protein shake recipe  Tanita scan results.  Monitoring/Evaluation:  Dietary intake, exercise, and body weight in 8-12 weeks, to call to set-up an appointment.

## 2011-04-08 ENCOUNTER — Encounter: Payer: Self-pay | Admitting: Dietician

## 2011-04-21 DIAGNOSIS — M069 Rheumatoid arthritis, unspecified: Secondary | ICD-10-CM | POA: Diagnosis not present

## 2011-04-21 DIAGNOSIS — M810 Age-related osteoporosis without current pathological fracture: Secondary | ICD-10-CM | POA: Diagnosis not present

## 2011-04-21 DIAGNOSIS — M25549 Pain in joints of unspecified hand: Secondary | ICD-10-CM | POA: Diagnosis not present

## 2011-04-21 DIAGNOSIS — M159 Polyosteoarthritis, unspecified: Secondary | ICD-10-CM | POA: Diagnosis not present

## 2011-04-28 ENCOUNTER — Encounter (HOSPITAL_COMMUNITY)
Admission: RE | Admit: 2011-04-28 | Discharge: 2011-04-28 | Disposition: A | Discharge status: Home / Self Care | Payer: Medicare Other | Source: Ambulatory Visit | Attending: Internal Medicine | Admitting: Internal Medicine

## 2011-04-28 DIAGNOSIS — M069 Rheumatoid arthritis, unspecified: Secondary | ICD-10-CM | POA: Diagnosis not present

## 2011-04-28 MED ORDER — SODIUM CHLORIDE 0.9 % IV SOLN
5.0000 mg/kg | INTRAVENOUS | Status: DC
  Administered 2011-04-28: 600 mg via INTRAVENOUS
  Filled 2011-04-28: qty 60

## 2011-04-28 MED ORDER — SODIUM CHLORIDE 0.9 % IV SOLN
Freq: Once | INTRAVENOUS | Status: AC
  Administered 2011-04-28: 11:00:00 via INTRAVENOUS

## 2011-04-29 ENCOUNTER — Encounter (HOSPITAL_COMMUNITY): Payer: Medicare Other

## 2011-04-29 ENCOUNTER — Encounter (HOSPITAL_COMMUNITY): Payer: PRIVATE HEALTH INSURANCE

## 2011-05-05 ENCOUNTER — Encounter: Payer: Self-pay | Admitting: Emergency Medicine

## 2011-05-05 ENCOUNTER — Ambulatory Visit (INDEPENDENT_AMBULATORY_CARE_PROVIDER_SITE_OTHER): Payer: Medicare Other | Admitting: Emergency Medicine

## 2011-05-05 VITALS — BP 108/72 | HR 83 | Temp 97.8°F | Ht 63.0 in | Wt 245.6 lb

## 2011-05-05 DIAGNOSIS — J4489 Other specified chronic obstructive pulmonary disease: Secondary | ICD-10-CM | POA: Diagnosis not present

## 2011-05-05 DIAGNOSIS — J449 Chronic obstructive pulmonary disease, unspecified: Secondary | ICD-10-CM

## 2011-05-05 DIAGNOSIS — G4733 Obstructive sleep apnea (adult) (pediatric): Secondary | ICD-10-CM | POA: Diagnosis not present

## 2011-05-05 NOTE — Progress Notes (Signed)
71 yo woman, former smoker, with obesity, HTN, RA, allergic rhinitis, OSA dx by PSG not currently on CPAP due to intolerance of high pressure. Referred for evaluation for chronic cough.   Has been seen for chronic cough by Dr Massie Maroon for cough over the last 2 yrs. Prior to that she did not cough frequently, only with URIs. She reports persistent cough for about 2 yrs, usuallly non-prod but sometimes white to clear, appears to be worse during the winter months. Has also had PND and allergies that have remained bothersome. Was tried on zyrtec, also on Advair, QVAR at one point, not on these currently. Was treated for the last 2 months empirically for GERD with lansoprasole, off it now. She sometimes wakes at night with cough, PND. No real voice changes, some globus sensation and throat clearing.   ROV 07/20/09 -- returns for cough and exertional dyspnea. We started loratadine + fluticasone + NSWs, also omeprazole. Cough is better, still with nasal congestion and some gtt.   ROV 11/22/10 -- Seen previously for dyspnea and chronic cough.Also hx untreated OSA.  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 Her cough got better with rx PND and GERD, she was seen by Dr Annalee Genta - eval consistent w GERD related cough.  Current regimen = omeprazole (stopped fluticasone and loratadine). Her cough is better now, she believes that stopping Ca has made this better. Still has a little cough.  Her biggest complaint is progressive exertional dyspnea since July '12. Notices it with walking. Her eval has included clear CXR's, normal ECG.  She had a stress test in 2002 (?). Her wt has been stable.  She was told to go back on QVAR 2 weeks ago -> no change in her SOB. Has not used SABA.  She still believes that she cannot do CPAP due to the high required pressures. She has never seen a cardiologist formally, interested in seeing Dr Jens Som.   ROV 12/27/10 -- Returns for evaluation exertional  SOB in setting mixed disease, OSA (not on CPAP). We started Spiriva about 6 weeks ago, but she isn't sure that it has really helped. Maybe a small benefit in that it takes shorter time to recover. Use albuterol prn and finds that this helps her more. She saw Dr Jens Som as planned, underwent stress testing as below. Cough is stable, no better or worse.   Nuclear stress testing 12/20 by Dr Jens Som showed abnormal stress nuclear study with a small partially reversible distal anterior defect suggestive of soft tissue attenuation and mild ischemia.  ROV 02/03/11 -- OSA (not on CPAP), mild AFL with marginal response to BD, diastolic dysfxn (by TTE 12/12). Followed by Dr Jens Som, had a Myoview w possible evidence anterior ischemia. Underwent R and L heart cath on 1/23. RVP 46/8/16, PAP 40/16/26, PAOP 18/15/13.  I can't find the L cath results, but pt tells me that she was told that she didn't have any coronary occlusions. She is willing to go back to the sleep lab for titration and mask fitting. Still taking Spiriva, may be helping some but remains SOB. She isn't sure that the financial cost is worth the benefit.   ROV 05/05/11 -- OSA, mild AFL with marginal BD response, diastolic dysfxn. Since last visit we started CPAP - she is wearing and tolerating. She is using full face mask - dislikes it but is able to do it. She has more energy, feels better. She tried stopping Spiriva  to see if she would miss it - she may have missed it some, she has not restarted it. She went to nutritionist, has lost some wt.    EXAM:  Gen: Pleasant, well-nourished, in no distress,  normal affect  ENT: No lesions,  mouth clear,  oropharynx clear, no postnasal drip  Neck: No JVD, no TMG, no carotid bruits  Lungs: No use of accessory muscles, no dullness to percussion, clear without rales or rhonchi  Cardiovascular: RRR, heart sounds normal, no murmur or gallops, no peripheral edema  Musculoskeletal: No deformities, no  cyanosis or clubbing  Neuro: alert, non focal  Skin: Warm, no lesions or rashes   Pulmonary Function Test  Date: 07/20/2009  Height (in.): 64  Gender: Female  Pre-Spirometry  FVC Value: 1.93 L/min Pred: 2.88 L/min % Pred: 67 %  FEV1 Value: 1.31 L Pred: 2.06 L % Pred: 64 %  FEV1/FVC Value: 68 % Pred: 72 % FEF 25-75 Value: 0.86 L/min Pred: 2.35 L/min % Pred: 36 %  Post-Spirometry  FVC Value: 1.82 L/min Pred: 2.88 L/min % Pred: 63 %  FEV1 Value: 1.24 L Pred: 2.06 L % Pred: 60 %  FEV1/FVC Value: 68 % Pred: 72 % FEF 25-75 Value: 0.74 L/min Pred: 2.35 L/min % Pred: 31 %  Lung Volumes  TLC Value: 3.49 L % Pred: 72 %  RV Value: 1.55 L % Pred: 81 %  DLCO Value: 15.4 % % Pred: 58 %  DLCO/VA Value: 5.38 % % Pred: 151 %  Comments:  Mild to mod AFL, no BD response, restricted volumes, DLCO corrects to normal range for Va. RSV    OSA (obstructive sleep apnea) Now on CPAP and seems to be tolerating.  - continue this and efforts at wt loss.   COPD She tried stopping spiriva, may have missed it some.  - restart spiriva, will try to give sample as much as possible.

## 2011-05-05 NOTE — Assessment & Plan Note (Signed)
She tried stopping spiriva, may have missed it some.  - restart spiriva, will try to give sample as much as possible.

## 2011-05-05 NOTE — Patient Instructions (Signed)
Please continue your CPAP every night Continue your good work with your diet  Work on walking or exercising  Restart Spiriva daily.  Follow with Dr Delton Coombes in 6 months or sooner if you have any problems

## 2011-05-05 NOTE — Assessment & Plan Note (Signed)
Now on CPAP and seems to be tolerating.  - continue this and efforts at wt loss.

## 2011-05-26 ENCOUNTER — Other Ambulatory Visit (HOSPITAL_COMMUNITY): Payer: Self-pay | Admitting: *Deleted

## 2011-05-28 ENCOUNTER — Ambulatory Visit: Payer: Medicare Other | Admitting: Cardiology

## 2011-06-03 ENCOUNTER — Encounter (HOSPITAL_COMMUNITY)
Admission: RE | Admit: 2011-06-03 | Discharge: 2011-06-03 | Disposition: A | Discharge status: Home / Self Care | Payer: Medicare Other | Source: Ambulatory Visit | Attending: Internal Medicine | Admitting: Internal Medicine

## 2011-06-03 DIAGNOSIS — M159 Polyosteoarthritis, unspecified: Secondary | ICD-10-CM | POA: Diagnosis not present

## 2011-06-03 DIAGNOSIS — M069 Rheumatoid arthritis, unspecified: Secondary | ICD-10-CM | POA: Insufficient documentation

## 2011-06-03 DIAGNOSIS — M810 Age-related osteoporosis without current pathological fracture: Secondary | ICD-10-CM | POA: Diagnosis not present

## 2011-06-03 MED ORDER — SODIUM CHLORIDE 0.9 % IV SOLN
INTRAVENOUS | Status: DC
  Administered 2011-06-03: 15:00:00 via INTRAVENOUS

## 2011-06-03 MED ORDER — SODIUM CHLORIDE 0.9 % IV SOLN
8.0000 mg/kg | INTRAVENOUS | Status: DC
  Administered 2011-06-03: 900 mg via INTRAVENOUS
  Filled 2011-06-03: qty 90

## 2011-06-10 ENCOUNTER — Telehealth: Payer: Self-pay | Admitting: Emergency Medicine

## 2011-06-10 NOTE — Telephone Encounter (Signed)
Sample of spiriva has been left upfront for p/u and pt is aware. Nothing further was needed

## 2011-07-07 ENCOUNTER — Telehealth: Payer: Self-pay | Admitting: Emergency Medicine

## 2011-07-07 NOTE — Telephone Encounter (Signed)
Called, spoke with pt.  She is aware a sample of spiriva is at front for pick up.  She verbalized understanding of this and voiced no further questions/concerns at this time.

## 2011-07-08 ENCOUNTER — Encounter (HOSPITAL_COMMUNITY): Payer: Medicare Other

## 2011-07-15 ENCOUNTER — Encounter (HOSPITAL_COMMUNITY)
Admission: RE | Admit: 2011-07-15 | Discharge: 2011-07-15 | Disposition: A | Discharge status: Home / Self Care | Payer: Medicare Other | Source: Ambulatory Visit | Attending: Internal Medicine | Admitting: Internal Medicine

## 2011-07-15 DIAGNOSIS — M069 Rheumatoid arthritis, unspecified: Secondary | ICD-10-CM | POA: Insufficient documentation

## 2011-07-15 MED ORDER — SODIUM CHLORIDE 0.9 % IV SOLN
INTRAVENOUS | Status: DC
  Administered 2011-07-15: 250 mL via INTRAVENOUS

## 2011-07-15 MED ORDER — SODIUM CHLORIDE 0.9 % IV SOLN
8.0000 mg/kg | INTRAVENOUS | Status: DC
  Administered 2011-07-15: 900 mg via INTRAVENOUS
  Filled 2011-07-15: qty 90

## 2011-07-16 DIAGNOSIS — H2510 Age-related nuclear cataract, unspecified eye: Secondary | ICD-10-CM | POA: Diagnosis not present

## 2011-07-17 DIAGNOSIS — M810 Age-related osteoporosis without current pathological fracture: Secondary | ICD-10-CM | POA: Diagnosis not present

## 2011-07-17 DIAGNOSIS — M069 Rheumatoid arthritis, unspecified: Secondary | ICD-10-CM | POA: Diagnosis not present

## 2011-07-17 DIAGNOSIS — M159 Polyosteoarthritis, unspecified: Secondary | ICD-10-CM | POA: Diagnosis not present

## 2011-07-24 ENCOUNTER — Telehealth: Payer: Self-pay | Admitting: Emergency Medicine

## 2011-07-24 NOTE — Telephone Encounter (Signed)
Called and spoke with patient, informed her that samples for spiriva are at front desk for pick up.  Nothing further needed at this time.

## 2011-07-30 DIAGNOSIS — G4733 Obstructive sleep apnea (adult) (pediatric): Secondary | ICD-10-CM | POA: Diagnosis not present

## 2011-07-30 DIAGNOSIS — D170 Benign lipomatous neoplasm of skin and subcutaneous tissue of head, face and neck: Secondary | ICD-10-CM | POA: Diagnosis not present

## 2011-07-30 DIAGNOSIS — E7800 Pure hypercholesterolemia, unspecified: Secondary | ICD-10-CM | POA: Diagnosis not present

## 2011-07-30 DIAGNOSIS — I10 Essential (primary) hypertension: Secondary | ICD-10-CM | POA: Diagnosis not present

## 2011-07-30 DIAGNOSIS — L609 Nail disorder, unspecified: Secondary | ICD-10-CM | POA: Diagnosis not present

## 2011-08-19 ENCOUNTER — Encounter (HOSPITAL_COMMUNITY)
Admission: RE | Admit: 2011-08-19 | Discharge: 2011-08-19 | Disposition: A | Discharge status: Home / Self Care | Payer: Medicare Other | Source: Ambulatory Visit | Attending: Internal Medicine | Admitting: Internal Medicine

## 2011-08-19 DIAGNOSIS — M069 Rheumatoid arthritis, unspecified: Secondary | ICD-10-CM | POA: Insufficient documentation

## 2011-08-19 MED ORDER — SODIUM CHLORIDE 0.9 % IV SOLN
INTRAVENOUS | Status: AC
  Administered 2011-08-19: 10:00:00 via INTRAVENOUS

## 2011-08-19 MED ORDER — SODIUM CHLORIDE 0.9 % IV SOLN
8.0000 mg/kg | INTRAVENOUS | Status: AC
  Administered 2011-08-19: 900 mg via INTRAVENOUS
  Filled 2011-08-19: qty 90

## 2011-08-20 ENCOUNTER — Telehealth: Payer: Self-pay | Admitting: Emergency Medicine

## 2011-08-20 NOTE — Telephone Encounter (Signed)
I spoke with pt and advised her will leave 2 samples of the spiriva upfront for pick up. Nothing further was needed

## 2011-09-17 ENCOUNTER — Telehealth: Payer: Self-pay | Admitting: Emergency Medicine

## 2011-09-17 MED ORDER — TIOTROPIUM BROMIDE MONOHYDRATE 18 MCG IN CAPS: ORAL_CAPSULE | RESPIRATORY_TRACT | Status: DC

## 2011-09-17 NOTE — Telephone Encounter (Signed)
2 boxes of spiriva left at front for pick up - lmomtcb to inform pt

## 2011-09-18 NOTE — Telephone Encounter (Signed)
Spoke with pt.  Informed her 2 samples of spiriva at front for pick up.  She verbalized understanding of this and voiced no further questions/concerns a this time.

## 2011-09-23 ENCOUNTER — Encounter (HOSPITAL_COMMUNITY)
Admission: RE | Admit: 2011-09-23 | Discharge: 2011-09-23 | Disposition: A | Discharge status: Home / Self Care | Payer: Medicare Other | Source: Ambulatory Visit | Attending: Internal Medicine | Admitting: Internal Medicine

## 2011-09-23 DIAGNOSIS — M069 Rheumatoid arthritis, unspecified: Secondary | ICD-10-CM | POA: Diagnosis not present

## 2011-09-23 MED ORDER — LORATADINE 10 MG PO TABS: 10.0000 mg | ORAL_TABLET | Freq: Once | ORAL | Status: DC

## 2011-09-23 MED ORDER — SODIUM CHLORIDE 0.9 % IV SOLN
INTRAVENOUS | Status: DC
Start: 2011-09-23 — End: 2011-09-24
  Administered 2011-09-23: 10:00:00 via INTRAVENOUS

## 2011-09-23 MED ORDER — SODIUM CHLORIDE 0.9 % IV SOLN
8.0000 mg/kg | INTRAVENOUS | Status: DC
  Administered 2011-09-23: 900 mg via INTRAVENOUS
  Filled 2011-09-23: qty 90

## 2011-09-23 MED ORDER — LORATADINE 10 MG PO TABS: 10.0000 mg | ORAL_TABLET | Freq: Every day | ORAL | Status: DC

## 2011-09-29 DIAGNOSIS — Z803 Family history of malignant neoplasm of breast: Secondary | ICD-10-CM | POA: Diagnosis not present

## 2011-09-29 DIAGNOSIS — Z1231 Encounter for screening mammogram for malignant neoplasm of breast: Secondary | ICD-10-CM | POA: Diagnosis not present

## 2011-10-27 ENCOUNTER — Other Ambulatory Visit (HOSPITAL_COMMUNITY): Payer: Self-pay | Admitting: *Deleted

## 2011-10-28 ENCOUNTER — Encounter (HOSPITAL_COMMUNITY)
Admission: RE | Admit: 2011-10-28 | Discharge: 2011-10-28 | Disposition: A | Discharge status: Home / Self Care | Payer: Medicare Other | Source: Ambulatory Visit | Attending: Internal Medicine | Admitting: Internal Medicine

## 2011-10-28 DIAGNOSIS — M069 Rheumatoid arthritis, unspecified: Secondary | ICD-10-CM | POA: Insufficient documentation

## 2011-10-28 MED ORDER — SODIUM CHLORIDE 0.9 % IV SOLN
INTRAVENOUS | Status: DC
  Administered 2011-10-28: 250 mL via INTRAVENOUS

## 2011-10-28 MED ORDER — SODIUM CHLORIDE 0.9 % IV SOLN
8.0000 mg/kg | INTRAVENOUS | Status: DC
  Administered 2011-10-28: 900 mg via INTRAVENOUS
  Filled 2011-10-28: qty 90

## 2011-10-28 MED ORDER — LORATADINE 10 MG PO TABS: 10.0000 mg | ORAL_TABLET | Freq: Every day | ORAL | Status: DC

## 2011-10-29 DIAGNOSIS — M810 Age-related osteoporosis without current pathological fracture: Secondary | ICD-10-CM | POA: Diagnosis not present

## 2011-10-29 DIAGNOSIS — M069 Rheumatoid arthritis, unspecified: Secondary | ICD-10-CM | POA: Diagnosis not present

## 2011-10-29 DIAGNOSIS — M159 Polyosteoarthritis, unspecified: Secondary | ICD-10-CM | POA: Diagnosis not present

## 2011-10-29 DIAGNOSIS — M79609 Pain in unspecified limb: Secondary | ICD-10-CM | POA: Diagnosis not present

## 2011-11-03 ENCOUNTER — Encounter: Payer: Self-pay | Admitting: Emergency Medicine

## 2011-11-03 ENCOUNTER — Ambulatory Visit (INDEPENDENT_AMBULATORY_CARE_PROVIDER_SITE_OTHER): Payer: Medicare Other | Admitting: Emergency Medicine

## 2011-11-03 VITALS — BP 128/82 | HR 98 | Temp 98.0°F | Ht 63.0 in | Wt 240.4 lb

## 2011-11-03 DIAGNOSIS — J4489 Other specified chronic obstructive pulmonary disease: Secondary | ICD-10-CM | POA: Diagnosis not present

## 2011-11-03 DIAGNOSIS — G4733 Obstructive sleep apnea (adult) (pediatric): Secondary | ICD-10-CM

## 2011-11-03 DIAGNOSIS — J449 Chronic obstructive pulmonary disease, unspecified: Secondary | ICD-10-CM

## 2011-11-03 NOTE — Progress Notes (Signed)
71 yo woman, former smoker, with obesity, HTN, RA, allergic rhinitis, OSA dx by PSG not currently on CPAP due to intolerance of high pressure. Referred for evaluation for chronic cough.   Has been seen for chronic cough by Dr Lady Deutscher for cough over the last 2 yrs. Prior to that she did not cough frequently, only with URIs. She reports persistent cough for about 2 yrs, usuallly non-prod but sometimes white to clear, appears to be worse during the winter months. Has also had PND and allergies that have remained bothersome. Was tried on zyrtec, also on Advair, QVAR at one point, not on these currently. Was treated for the last 2 months empirically for GERD with lansoprasole, off it now. She sometimes wakes at night with cough, PND. No real voice changes, some globus sensation and throat clearing.   ROV 07/20/09 -- returns for cough and exertional dyspnea. We started loratadine + fluticasone + NSWs, also omeprazole. Cough is better, still with nasal congestion and some gtt.   ROV 11/22/10 -- Seen previously for dyspnea and chronic cough.Also hx untreated OSA.  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 Her cough got better with rx PND and GERD, she was seen by Dr Wilburn Cornelia - eval consistent w GERD related cough.  Current regimen = omeprazole (stopped fluticasone and loratadine). Her cough is better now, she believes that stopping Ca has made this better. Still has a little cough.  Her biggest complaint is progressive exertional dyspnea since July '12. Notices it with walking. Her eval has included clear CXR's, normal ECG.  She had a stress test in 2002 (?). Her wt has been stable.  She was told to go back on QVAR 2 weeks ago -> no change in her SOB. Has not used SABA.  She still believes that she cannot do CPAP due to the high required pressures. She has never seen a cardiologist formally, interested in seeing Dr Stanford Breed.   ROV 12/27/10 -- Returns for evaluation exertional  SOB in setting mixed disease, OSA (not on CPAP). We started Spiriva about 6 weeks ago, but she isn't sure that it has really helped. Maybe a small benefit in that it takes shorter time to recover. Use albuterol prn and finds that this helps her more. She saw Dr Stanford Breed as planned, underwent stress testing as below. Cough is stable, no better or worse.   Nuclear stress testing 12/20 by Dr Stanford Breed showed abnormal stress nuclear study with a small partially reversible distal anterior defect suggestive of soft tissue attenuation and mild ischemia.  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 Followed by Dr Stanford Breed, had a Myoview w possible evidence anterior ischemia. Underwent R and L heart cath on 1/23. RVP 46/8/16, PAP 40/16/26, PAOP 18/15/13.  I can't find the L cath results, but pt tells me that she was told that she didn't have any coronary occlusions. She is willing to go back to the sleep lab for titration and mask fitting. Still taking Spiriva, may be helping some but remains SOB. She isn't sure that the financial cost is worth the benefit.   ROV 05/05/11 -- OSA, mild AFL with marginal BD response, diastolic dysfxn. Since last visit we started CPAP - she is wearing and tolerating. She is using full face mask - dislikes it but is able to do it. She has more energy, feels better. She tried stopping Spiriva  to see if she would miss it - she may have missed it some, she has not restarted it. She went to nutritionist, has lost some wt.   ROV 11/03/11 -- OSA, mild AFL with marginal BD response, diastolic dysfxn. Since last time she has taken a break from her CPAP - hasn;t used for about 2 months due to mask difficulties and the fact that she doesn;t like it. She got out of the habit. She is willing and wants to get back on it - not sure that she misses it, but maybe was more rested. She has lost some more wt.  She remains on Spiriva, is getting  samples.    EXAM:  Filed Vitals:   11/03/11 0909  BP: 128/82  Pulse: 98  Temp: 98 F (36.7 C)    Gen: Pleasant, well-nourished, in no distress,  normal affect  ENT: No lesions,  mouth clear,  oropharynx clear, no postnasal drip  Neck: No JVD, no TMG, no carotid bruits  Lungs: No use of accessory muscles, no dullness to percussion, clear without rales or rhonchi  Cardiovascular: RRR, heart sounds normal, no murmur or gallops, no peripheral edema  Musculoskeletal: No deformities, no cyanosis or clubbing  Neuro: alert, non focal  Skin: Warm, no lesions or rashes   Pulmonary Function Test  Date: 07/20/2009  Height (in.): 64  Gender: Female  Pre-Spirometry  FVC Value: 1.93 L/min Pred: 2.88 L/min % Pred: 67 %  FEV1 Value: 1.31 L Pred: 2.06 L % Pred: 64 %  FEV1/FVC Value: 68 % Pred: 72 % FEF 25-75 Value: 0.86 L/min Pred: 2.35 L/min % Pred: 36 %  Post-Spirometry  FVC Value: 1.82 L/min Pred: 2.88 L/min % Pred: 63 %  FEV1 Value: 1.24 L Pred: 2.06 L % Pred: 60 %  FEV1/FVC Value: 68 % Pred: 72 % FEF 25-75 Value: 0.74 L/min Pred: 2.35 L/min % Pred: 31 %  Lung Volumes  TLC Value: 3.49 L % Pred: 72 %  RV Value: 1.55 L % Pred: 81 %  DLCO Value: 15.4 % % Pred: 58 %  DLCO/VA Value: 5.38 % % Pred: 151 %  Comments:  Mild to mod AFL, no BD response, restricted volumes, DLCO corrects to normal range for Va. RSV    OSA (obstructive sleep apnea) Currently off CPAP, but is willing to retry.  - will work towards getting back to CPAP every night  COPD We will continue Spiriva, has been helpful. Her coughing spells are improved.

## 2011-11-03 NOTE — Assessment & Plan Note (Signed)
We will continue Spiriva, has been helpful. Her coughing spells are improved.

## 2011-11-03 NOTE — Assessment & Plan Note (Signed)
Currently off CPAP, but is willing to retry.  - will work towards getting back to CPAP every night

## 2011-11-03 NOTE — Patient Instructions (Addendum)
Try to go back to your CPAP every night Continue your Spiriva every day Follow with Dr Delton Coombes in 6 months or sooner if you have any problems

## 2011-12-02 ENCOUNTER — Encounter (HOSPITAL_COMMUNITY)
Admission: RE | Admit: 2011-12-02 | Discharge: 2011-12-02 | Disposition: A | Discharge status: Home / Self Care | Payer: Medicare Other | Source: Ambulatory Visit | Attending: Internal Medicine | Admitting: Internal Medicine

## 2011-12-02 DIAGNOSIS — M069 Rheumatoid arthritis, unspecified: Secondary | ICD-10-CM | POA: Insufficient documentation

## 2011-12-02 MED ORDER — SODIUM CHLORIDE 0.9 % IV SOLN
INTRAVENOUS | Status: DC
  Administered 2011-12-02: 250 mL via INTRAVENOUS

## 2011-12-02 MED ORDER — LORATADINE 10 MG PO TABS: 10.0000 mg | ORAL_TABLET | Freq: Every day | ORAL | Status: DC

## 2011-12-02 MED ORDER — SODIUM CHLORIDE 0.9 % IV SOLN
8.0000 mg/kg | INTRAVENOUS | Status: DC
  Administered 2011-12-02: 900 mg via INTRAVENOUS
  Filled 2011-12-02: qty 90

## 2011-12-15 ENCOUNTER — Telehealth: Payer: Self-pay | Admitting: Emergency Medicine

## 2011-12-15 MED ORDER — TIOTROPIUM BROMIDE MONOHYDRATE 18 MCG IN CAPS: ORAL_CAPSULE | RESPIRATORY_TRACT | Status: DC

## 2011-12-15 NOTE — Telephone Encounter (Signed)
Pt aware that samples are ready for her to pick up.

## 2012-01-06 ENCOUNTER — Encounter (HOSPITAL_COMMUNITY)
Admission: RE | Admit: 2012-01-06 | Discharge: 2012-01-06 | Disposition: A | Discharge status: Home / Self Care | Payer: Medicare Other | Source: Ambulatory Visit | Attending: Internal Medicine | Admitting: Internal Medicine

## 2012-01-06 DIAGNOSIS — M069 Rheumatoid arthritis, unspecified: Secondary | ICD-10-CM | POA: Diagnosis not present

## 2012-01-06 MED ORDER — SODIUM CHLORIDE 0.9 % IV SOLN
8.0000 mg/kg | INTRAVENOUS | Status: DC
  Administered 2012-01-06: 900 mg via INTRAVENOUS
  Filled 2012-01-06: qty 90

## 2012-01-06 MED ORDER — SODIUM CHLORIDE 0.9 % IV SOLN
INTRAVENOUS | Status: DC
  Administered 2012-01-06: 250 mL via INTRAVENOUS

## 2012-01-06 MED ORDER — LORATADINE 10 MG PO TABS: 10.0000 mg | ORAL_TABLET | Freq: Every day | ORAL | Status: DC

## 2012-01-14 ENCOUNTER — Telehealth: Payer: Self-pay | Admitting: Emergency Medicine

## 2012-01-14 NOTE — Telephone Encounter (Signed)
Pt is aware that we have samples ready for her to pick up. 

## 2012-02-05 DIAGNOSIS — L821 Other seborrheic keratosis: Secondary | ICD-10-CM | POA: Diagnosis not present

## 2012-02-05 DIAGNOSIS — L820 Inflamed seborrheic keratosis: Secondary | ICD-10-CM | POA: Diagnosis not present

## 2012-02-05 DIAGNOSIS — Z85828 Personal history of other malignant neoplasm of skin: Secondary | ICD-10-CM | POA: Diagnosis not present

## 2012-02-09 ENCOUNTER — Other Ambulatory Visit (HOSPITAL_COMMUNITY): Payer: Self-pay | Admitting: *Deleted

## 2012-02-10 ENCOUNTER — Encounter (HOSPITAL_COMMUNITY)
Admission: RE | Admit: 2012-02-10 | Discharge: 2012-02-10 | Disposition: A | Discharge status: Home / Self Care | Payer: Medicare Other | Source: Ambulatory Visit | Attending: Internal Medicine | Admitting: Internal Medicine

## 2012-02-10 DIAGNOSIS — M069 Rheumatoid arthritis, unspecified: Secondary | ICD-10-CM | POA: Insufficient documentation

## 2012-02-10 MED ORDER — SODIUM CHLORIDE 0.9 % IV SOLN
8.0000 mg/kg | INTRAVENOUS | Status: DC
  Administered 2012-02-10: 900 mg via INTRAVENOUS
  Filled 2012-02-10: qty 90

## 2012-02-10 MED ORDER — SODIUM CHLORIDE 0.9 % IV SOLN
INTRAVENOUS | Status: DC
  Administered 2012-02-10: 250 mL via INTRAVENOUS

## 2012-02-24 DIAGNOSIS — D649 Anemia, unspecified: Secondary | ICD-10-CM | POA: Diagnosis not present

## 2012-02-24 DIAGNOSIS — Z0000 Encounter for general adult medical examination without abnormal findings: Secondary | ICD-10-CM | POA: Diagnosis not present

## 2012-02-24 DIAGNOSIS — R05 Cough: Secondary | ICD-10-CM | POA: Diagnosis not present

## 2012-02-24 DIAGNOSIS — I10 Essential (primary) hypertension: Secondary | ICD-10-CM | POA: Diagnosis not present

## 2012-02-24 DIAGNOSIS — M069 Rheumatoid arthritis, unspecified: Secondary | ICD-10-CM | POA: Diagnosis not present

## 2012-02-24 DIAGNOSIS — E669 Obesity, unspecified: Secondary | ICD-10-CM | POA: Diagnosis not present

## 2012-02-24 DIAGNOSIS — G4733 Obstructive sleep apnea (adult) (pediatric): Secondary | ICD-10-CM | POA: Diagnosis not present

## 2012-02-24 DIAGNOSIS — E7800 Pure hypercholesterolemia, unspecified: Secondary | ICD-10-CM | POA: Diagnosis not present

## 2012-02-24 DIAGNOSIS — M810 Age-related osteoporosis without current pathological fracture: Secondary | ICD-10-CM | POA: Diagnosis not present

## 2012-02-24 DIAGNOSIS — R059 Cough, unspecified: Secondary | ICD-10-CM | POA: Diagnosis not present

## 2012-03-02 DIAGNOSIS — M159 Polyosteoarthritis, unspecified: Secondary | ICD-10-CM | POA: Diagnosis not present

## 2012-03-02 DIAGNOSIS — M069 Rheumatoid arthritis, unspecified: Secondary | ICD-10-CM | POA: Diagnosis not present

## 2012-03-02 DIAGNOSIS — M810 Age-related osteoporosis without current pathological fracture: Secondary | ICD-10-CM | POA: Diagnosis not present

## 2012-03-04 DIAGNOSIS — M21619 Bunion of unspecified foot: Secondary | ICD-10-CM | POA: Diagnosis not present

## 2012-03-04 DIAGNOSIS — D492 Neoplasm of unspecified behavior of bone, soft tissue, and skin: Secondary | ICD-10-CM | POA: Diagnosis not present

## 2012-03-04 DIAGNOSIS — B351 Tinea unguium: Secondary | ICD-10-CM | POA: Diagnosis not present

## 2012-03-04 DIAGNOSIS — M79609 Pain in unspecified limb: Secondary | ICD-10-CM | POA: Diagnosis not present

## 2012-03-16 ENCOUNTER — Encounter (HOSPITAL_COMMUNITY)
Admission: RE | Admit: 2012-03-16 | Discharge: 2012-03-16 | Disposition: A | Discharge status: Home / Self Care | Payer: Medicare Other | Source: Ambulatory Visit | Attending: Internal Medicine | Admitting: Internal Medicine

## 2012-03-16 DIAGNOSIS — M069 Rheumatoid arthritis, unspecified: Secondary | ICD-10-CM | POA: Insufficient documentation

## 2012-03-16 MED ORDER — SODIUM CHLORIDE 0.9 % IV SOLN
8.0000 mg/kg | INTRAVENOUS | Status: DC
  Administered 2012-03-16: 900 mg via INTRAVENOUS
  Filled 2012-03-16: qty 90

## 2012-03-16 MED ORDER — SODIUM CHLORIDE 0.9 % IV SOLN
INTRAVENOUS | Status: DC
  Administered 2012-03-16: 09:00:00 via INTRAVENOUS

## 2012-03-16 MED ORDER — LORATADINE 10 MG PO TABS: 10.0000 mg | ORAL_TABLET | Freq: Every day | ORAL | Status: DC

## 2012-03-30 ENCOUNTER — Other Ambulatory Visit: Payer: Self-pay | Admitting: Emergency Medicine

## 2012-03-30 MED ORDER — FLUTICASONE PROPIONATE 50 MCG/ACT NA SUSP: 2.0000 | Freq: Two times a day (BID) | NASAL | Status: DC | PRN

## 2012-04-01 ENCOUNTER — Telehealth: Payer: Self-pay | Admitting: Emergency Medicine

## 2012-04-01 MED ORDER — TIOTROPIUM BROMIDE MONOHYDRATE 18 MCG IN CAPS: ORAL_CAPSULE | RESPIRATORY_TRACT | Status: DC

## 2012-04-01 NOTE — Telephone Encounter (Signed)
I spoke with pt and is aware samples have been left upfront for pick up. Nothing further was needed

## 2012-04-08 DIAGNOSIS — M810 Age-related osteoporosis without current pathological fracture: Secondary | ICD-10-CM | POA: Diagnosis not present

## 2012-04-20 ENCOUNTER — Encounter (HOSPITAL_COMMUNITY)
Admission: RE | Admit: 2012-04-20 | Discharge: 2012-04-20 | Disposition: A | Discharge status: Home / Self Care | Payer: Medicare Other | Source: Ambulatory Visit | Attending: Internal Medicine | Admitting: Internal Medicine

## 2012-04-20 DIAGNOSIS — M069 Rheumatoid arthritis, unspecified: Secondary | ICD-10-CM | POA: Diagnosis not present

## 2012-04-20 MED ORDER — SODIUM CHLORIDE 0.9 % IV SOLN
8.0000 mg/kg | INTRAVENOUS | Status: DC
  Administered 2012-04-20: 900 mg via INTRAVENOUS
  Filled 2012-04-20: qty 90

## 2012-04-20 MED ORDER — SODIUM CHLORIDE 0.9 % IV SOLN
INTRAVENOUS | Status: DC
  Administered 2012-04-20: 09:00:00 via INTRAVENOUS

## 2012-05-04 ENCOUNTER — Ambulatory Visit (INDEPENDENT_AMBULATORY_CARE_PROVIDER_SITE_OTHER): Payer: Medicare Other | Admitting: Emergency Medicine

## 2012-05-04 ENCOUNTER — Encounter: Payer: Self-pay | Admitting: Emergency Medicine

## 2012-05-04 ENCOUNTER — Ambulatory Visit (INDEPENDENT_AMBULATORY_CARE_PROVIDER_SITE_OTHER)
Admission: RE | Admit: 2012-05-04 | Discharge: 2012-05-04 | Disposition: A | Discharge status: Home / Self Care | Payer: Medicare Other | Source: Ambulatory Visit | Attending: Emergency Medicine | Admitting: Emergency Medicine

## 2012-05-04 VITALS — BP 124/78 | HR 68 | Temp 98.0°F | Ht 62.5 in | Wt 231.2 lb

## 2012-05-04 DIAGNOSIS — M069 Rheumatoid arthritis, unspecified: Secondary | ICD-10-CM | POA: Diagnosis not present

## 2012-05-04 DIAGNOSIS — J4489 Other specified chronic obstructive pulmonary disease: Secondary | ICD-10-CM

## 2012-05-04 DIAGNOSIS — I272 Pulmonary hypertension, unspecified: Secondary | ICD-10-CM

## 2012-05-04 DIAGNOSIS — E669 Obesity, unspecified: Secondary | ICD-10-CM

## 2012-05-04 DIAGNOSIS — G4733 Obstructive sleep apnea (adult) (pediatric): Secondary | ICD-10-CM

## 2012-05-04 DIAGNOSIS — J449 Chronic obstructive pulmonary disease, unspecified: Secondary | ICD-10-CM | POA: Diagnosis not present

## 2012-05-04 DIAGNOSIS — I2789 Other specified pulmonary heart diseases: Secondary | ICD-10-CM | POA: Diagnosis not present

## 2012-05-04 DIAGNOSIS — J984 Other disorders of lung: Secondary | ICD-10-CM | POA: Diagnosis not present

## 2012-05-04 NOTE — Assessment & Plan Note (Signed)
Continue spiriva + SABA prn

## 2012-05-04 NOTE — Assessment & Plan Note (Signed)
Follow CXR, no prior evidence ILD

## 2012-05-04 NOTE — Assessment & Plan Note (Signed)
Congratulated and encouraged wt loss

## 2012-05-04 NOTE — Progress Notes (Signed)
72 yo woman, former smoker, with obesity, HTN, RA, allergic rhinitis, OSA dx by PSG not currently on CPAP due to intolerance of high pressure. Referred for evaluation for chronic cough.   Has been seen for chronic cough by Dr Lady Deutscher for cough over the last 2 yrs. Prior to that she did not cough frequently, only with URIs. She reports persistent cough for about 2 yrs, usuallly non-prod but sometimes white to clear, appears to be worse during the winter months. Has also had PND and allergies that have remained bothersome. Was tried on zyrtec, also on Advair, QVAR at one point, not on these currently. Was treated for the last 2 months empirically for GERD with lansoprasole, off it now. She sometimes wakes at night with cough, PND. No real voice changes, some globus sensation and throat clearing.   ROV 07/20/09 -- returns for cough and exertional dyspnea. We started loratadine + fluticasone + NSWs, also omeprazole. Cough is better, still with nasal congestion and some gtt.   ROV 11/22/10 -- Seen previously for dyspnea and chronic cough.Also hx untreated OSA.  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 Her cough got better with rx PND and GERD, she was seen by Dr Wilburn Cornelia - eval consistent w GERD related cough.  Current regimen = omeprazole (stopped fluticasone and loratadine). Her cough is better now, she believes that stopping Ca has made this better. Still has a little cough.  Her biggest complaint is progressive exertional dyspnea since July '12. Notices it with walking. Her eval has included clear CXR's, normal ECG.  She had a stress test in 2002 (?). Her wt has been stable.  She was told to go back on QVAR 2 weeks ago -> no change in her SOB. Has not used SABA.  She still believes that she cannot do CPAP due to the high required pressures. She has never seen a cardiologist formally, interested in seeing Dr Stanford Breed.   ROV 12/27/10 -- Returns for evaluation exertional  SOB in setting mixed disease, OSA (not on CPAP). We started Spiriva about 6 weeks ago, but she isn't sure that it has really helped. Maybe a small benefit in that it takes shorter time to recover. Use albuterol prn and finds that this helps her more. She saw Dr Stanford Breed as planned, underwent stress testing as below. Cough is stable, no better or worse.   Nuclear stress testing 12/20 by Dr Stanford Breed showed abnormal stress nuclear study with a small partially reversible distal anterior defect suggestive of soft tissue attenuation and mild ischemia.  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 Followed by Dr Stanford Breed, had a Myoview w possible evidence anterior ischemia. Underwent R and L heart cath on 1/23. RVP 46/8/16, PAP 40/16/26, PAOP 18/15/13.  I can't find the L cath results, but pt tells me that she was told that she didn't have any coronary occlusions. She is willing to go back to the sleep lab for titration and mask fitting. Still taking Spiriva, may be helping some but remains SOB. She isn't sure that the financial cost is worth the benefit.   ROV 05/05/11 -- OSA, mild AFL with marginal BD response, diastolic dysfxn. Since last visit we started CPAP - she is wearing and tolerating. She is using full face mask - dislikes it but is able to do it. She has more energy, feels better. She tried stopping Spiriva  to see if she would miss it - she may have missed it some, she has not restarted it. She went to nutritionist, has lost some wt.   ROV 11/03/11 -- OSA, mild AFL with marginal BD response, diastolic dysfxn. Since last time she has taken a break from her CPAP - hasn;t used for about 2 months due to mask difficulties and the fact that she doesn;t like it. She got out of the habit. She is willing and wants to get back on it - not sure that she misses it, but maybe was more rested. She has lost some more wt.  She remains on Spiriva, is getting  samples.   ROV 05/04/12 -- Hx RA, OSA, mild AFL with marginal BD response, diastolic dysfxn, borderline secondary PAH. She is using her zyrtec and fluticasone nasal spray. Her cough is better, breathing is better. She remains on omeprazole. She was not able/willing to get back to regular CPAP. She has lost approx 30 lbs over 9-12 months. MTX and remicade for her RA. Last CXR was 1/'13, no ILD. She is on Spiriva, uses albuterol very rarely. No AE reported.    EXAM:  Filed Vitals:   05/04/12 1326  BP: 124/78  Pulse: 68  Temp: 98 F (36.7 C)    Gen: Pleasant, obese, in no distress,  normal affect  ENT: No lesions,  mouth clear,  oropharynx clear, no postnasal drip  Neck: No JVD, no TMG, no carotid bruits  Lungs: No use of accessory muscles, no dullness to percussion, clear without rales or rhonchi  Cardiovascular: RRR, heart sounds normal, no murmur or gallops, no peripheral edema  Musculoskeletal: No deformities, no cyanosis or clubbing  Neuro: alert, non focal  Skin: Warm, no lesions or rashes   Pulmonary Function Test  Date: 07/20/2009  Height (in.): 64  Gender: Female  Pre-Spirometry  FVC Value: 1.93 L/min Pred: 2.88 L/min % Pred: 67 %  FEV1 Value: 1.31 L Pred: 2.06 L % Pred: 64 %  FEV1/FVC Value: 68 % Pred: 72 % FEF 25-75 Value: 0.86 L/min Pred: 2.35 L/min % Pred: 36 %  Post-Spirometry  FVC Value: 1.82 L/min Pred: 2.88 L/min % Pred: 63 %  FEV1 Value: 1.24 L Pred: 2.06 L % Pred: 60 %  FEV1/FVC Value: 68 % Pred: 72 % FEF 25-75 Value: 0.74 L/min Pred: 2.35 L/min % Pred: 31 %  Lung Volumes  TLC Value: 3.49 L % Pred: 72 %  RV Value: 1.55 L % Pred: 81 %  DLCO Value: 15.4 % % Pred: 58 %  DLCO/VA Value: 5.38 % % Pred: 151 %  Comments:  Mild to mod AFL, no BD response, restricted volumes, DLCO corrects to normal range for Va. RSV    ARTHRITIS, RHEUMATOID Follow CXR, no prior evidence ILD  COPD Continue spiriva + SABA prn  OSA (obstructive sleep apnea) Not interested  right now in re-trying CPAP, getting new mask, etc - will defer, but revist at future appt  Mild pulmonary hypertension Discussed importance of relationship of untreated OSA to her PAH - she understands this  OBESITY Congratulated and encouraged wt loss

## 2012-05-04 NOTE — Assessment & Plan Note (Signed)
Discussed importance of relationship of untreated OSA to her PAH - she understands this

## 2012-05-04 NOTE — Patient Instructions (Addendum)
Please continue Spiriva daily Use albuterol 2 puffs if needed for shortness of breath Keep up the good work with your weight loss Continue your fluticasone spray., zyrtec and omeprazole Follow with Dr Delton Coombes in 12 months or sooner if you have any problems

## 2012-05-04 NOTE — Assessment & Plan Note (Signed)
Not interested right now in re-trying CPAP, getting new mask, etc - will defer, but revist at future appt

## 2012-05-19 NOTE — Progress Notes (Signed)
Quick Note:  Spoke with patient, informed her of results as listed below per RB Patient verbalized understanding and nothing further needed at this time ______ 

## 2012-05-24 ENCOUNTER — Other Ambulatory Visit (HOSPITAL_COMMUNITY): Payer: Self-pay | Admitting: *Deleted

## 2012-05-25 ENCOUNTER — Encounter (HOSPITAL_COMMUNITY)
Admission: RE | Admit: 2012-05-25 | Discharge: 2012-05-25 | Disposition: A | Discharge status: Home / Self Care | Payer: Medicare Other | Source: Ambulatory Visit | Attending: Internal Medicine | Admitting: Internal Medicine

## 2012-05-25 DIAGNOSIS — M069 Rheumatoid arthritis, unspecified: Secondary | ICD-10-CM | POA: Insufficient documentation

## 2012-05-25 DIAGNOSIS — B351 Tinea unguium: Secondary | ICD-10-CM | POA: Diagnosis not present

## 2012-05-25 DIAGNOSIS — M79609 Pain in unspecified limb: Secondary | ICD-10-CM | POA: Diagnosis not present

## 2012-05-25 MED ORDER — SODIUM CHLORIDE 0.9 % IV SOLN
INTRAVENOUS | Status: DC
Start: 2012-05-25 — End: 2012-05-26
  Administered 2012-05-25: 250 mL via INTRAVENOUS

## 2012-05-25 MED ORDER — SODIUM CHLORIDE 0.9 % IV SOLN
8.0000 mg/kg | INTRAVENOUS | Status: DC
  Administered 2012-05-25: 900 mg via INTRAVENOUS
  Filled 2012-05-25: qty 90

## 2012-06-29 ENCOUNTER — Encounter (HOSPITAL_COMMUNITY)
Admission: RE | Admit: 2012-06-29 | Discharge: 2012-06-29 | Disposition: A | Discharge status: Home / Self Care | Payer: Medicare Other | Source: Ambulatory Visit | Attending: Internal Medicine | Admitting: Internal Medicine

## 2012-06-29 ENCOUNTER — Encounter (HOSPITAL_COMMUNITY): Payer: Medicare Other

## 2012-06-29 DIAGNOSIS — M069 Rheumatoid arthritis, unspecified: Secondary | ICD-10-CM | POA: Diagnosis not present

## 2012-06-29 MED ORDER — INFLIXIMAB 100 MG IV SOLR
8.0000 mg/kg | INTRAVENOUS | Status: DC
  Administered 2012-06-29: 900 mg via INTRAVENOUS
  Filled 2012-06-29: qty 90

## 2012-06-29 MED ORDER — SODIUM CHLORIDE 0.9 % IV SOLN
INTRAVENOUS | Status: DC
  Administered 2012-06-29: 250 mL via INTRAVENOUS

## 2012-07-06 ENCOUNTER — Telehealth: Payer: Self-pay | Admitting: Emergency Medicine

## 2012-07-06 DIAGNOSIS — M159 Polyosteoarthritis, unspecified: Secondary | ICD-10-CM | POA: Diagnosis not present

## 2012-07-06 DIAGNOSIS — M810 Age-related osteoporosis without current pathological fracture: Secondary | ICD-10-CM | POA: Diagnosis not present

## 2012-07-06 DIAGNOSIS — M069 Rheumatoid arthritis, unspecified: Secondary | ICD-10-CM | POA: Diagnosis not present

## 2012-07-06 MED ORDER — TIOTROPIUM BROMIDE MONOHYDRATE 18 MCG IN CAPS: 18.0000 ug | ORAL_CAPSULE | Freq: Every day | RESPIRATORY_TRACT | Status: DC

## 2012-07-06 NOTE — Telephone Encounter (Signed)
Last ov w/ RB 4.29.14: Patient Instructions    Please continue Spiriva daily  Use albuterol 2 puffs if needed for shortness of breath  Keep up the good work with your weight loss  Continue your fluticasone spray., zyrtec and omeprazole  Follow with Dr Delton Coombes in 12 months or sooner if you have any problems    2 samples Spiriva left up front for pt  LMOM TCB x1 Samples documented per protocol

## 2012-07-07 NOTE — Telephone Encounter (Signed)
Noted by triage Will sign off 

## 2012-07-07 NOTE — Telephone Encounter (Signed)
Pt aware samples ready.Taylor Berry

## 2012-07-22 DIAGNOSIS — H2510 Age-related nuclear cataract, unspecified eye: Secondary | ICD-10-CM | POA: Diagnosis not present

## 2012-08-02 ENCOUNTER — Other Ambulatory Visit (HOSPITAL_COMMUNITY): Payer: Self-pay | Admitting: *Deleted

## 2012-08-03 ENCOUNTER — Encounter (HOSPITAL_COMMUNITY)
Admission: RE | Admit: 2012-08-03 | Discharge: 2012-08-03 | Disposition: A | Discharge status: Home / Self Care | Payer: Medicare Other | Source: Ambulatory Visit | Attending: Internal Medicine | Admitting: Internal Medicine

## 2012-08-03 DIAGNOSIS — M069 Rheumatoid arthritis, unspecified: Secondary | ICD-10-CM | POA: Diagnosis not present

## 2012-08-03 MED ORDER — SODIUM CHLORIDE 0.9 % IV SOLN
INTRAVENOUS | Status: DC
  Administered 2012-08-03: 250 mL via INTRAVENOUS

## 2012-08-03 MED ORDER — SODIUM CHLORIDE 0.9 % IV SOLN
8.0000 mg/kg | INTRAVENOUS | Status: DC
  Administered 2012-08-03: 900 mg via INTRAVENOUS
  Filled 2012-08-03: qty 90

## 2012-08-12 DIAGNOSIS — L821 Other seborrheic keratosis: Secondary | ICD-10-CM | POA: Diagnosis not present

## 2012-08-12 DIAGNOSIS — Z85828 Personal history of other malignant neoplasm of skin: Secondary | ICD-10-CM | POA: Diagnosis not present

## 2012-08-12 DIAGNOSIS — D2370 Other benign neoplasm of skin of unspecified lower limb, including hip: Secondary | ICD-10-CM | POA: Diagnosis not present

## 2012-08-12 DIAGNOSIS — L819 Disorder of pigmentation, unspecified: Secondary | ICD-10-CM | POA: Diagnosis not present

## 2012-08-12 DIAGNOSIS — D239 Other benign neoplasm of skin, unspecified: Secondary | ICD-10-CM | POA: Diagnosis not present

## 2012-08-17 ENCOUNTER — Telehealth: Payer: Self-pay | Admitting: Emergency Medicine

## 2012-08-17 DIAGNOSIS — B351 Tinea unguium: Secondary | ICD-10-CM | POA: Diagnosis not present

## 2012-08-17 DIAGNOSIS — M79609 Pain in unspecified limb: Secondary | ICD-10-CM | POA: Diagnosis not present

## 2012-08-17 MED ORDER — TIOTROPIUM BROMIDE MONOHYDRATE 18 MCG IN CAPS: ORAL_CAPSULE | RESPIRATORY_TRACT | Status: DC

## 2012-08-17 NOTE — Telephone Encounter (Signed)
lmtcb x1 for pt. 

## 2012-08-17 NOTE — Telephone Encounter (Signed)
I spoke with pt. Aware samples of spiriva placed upfront for p/u. Nothing further needed

## 2012-08-17 NOTE — Telephone Encounter (Signed)
Pt returned call. Taylor Berry  

## 2012-08-24 DIAGNOSIS — E7800 Pure hypercholesterolemia, unspecified: Secondary | ICD-10-CM | POA: Diagnosis not present

## 2012-08-24 DIAGNOSIS — E669 Obesity, unspecified: Secondary | ICD-10-CM | POA: Diagnosis not present

## 2012-08-24 DIAGNOSIS — I2789 Other specified pulmonary heart diseases: Secondary | ICD-10-CM | POA: Diagnosis not present

## 2012-08-24 DIAGNOSIS — D649 Anemia, unspecified: Secondary | ICD-10-CM | POA: Diagnosis not present

## 2012-08-24 DIAGNOSIS — M069 Rheumatoid arthritis, unspecified: Secondary | ICD-10-CM | POA: Diagnosis not present

## 2012-08-24 DIAGNOSIS — M810 Age-related osteoporosis without current pathological fracture: Secondary | ICD-10-CM | POA: Diagnosis not present

## 2012-08-24 DIAGNOSIS — J42 Unspecified chronic bronchitis: Secondary | ICD-10-CM | POA: Diagnosis not present

## 2012-08-24 DIAGNOSIS — I10 Essential (primary) hypertension: Secondary | ICD-10-CM | POA: Diagnosis not present

## 2012-09-03 ENCOUNTER — Other Ambulatory Visit (HOSPITAL_COMMUNITY): Payer: Self-pay | Admitting: *Deleted

## 2012-09-07 ENCOUNTER — Encounter (HOSPITAL_COMMUNITY)
Admission: RE | Admit: 2012-09-07 | Discharge: 2012-09-07 | Disposition: A | Discharge status: Home / Self Care | Payer: Medicare Other | Source: Ambulatory Visit | Attending: Internal Medicine | Admitting: Internal Medicine

## 2012-09-07 DIAGNOSIS — M069 Rheumatoid arthritis, unspecified: Secondary | ICD-10-CM | POA: Insufficient documentation

## 2012-09-07 MED ORDER — SODIUM CHLORIDE 0.9 % IV SOLN
INTRAVENOUS | Status: AC
  Administered 2012-09-07: 10:00:00 via INTRAVENOUS

## 2012-09-07 MED ORDER — INFLIXIMAB 100 MG IV SOLR
8.0000 mg/kg | INTRAVENOUS | Status: AC
  Administered 2012-09-07: 900 mg via INTRAVENOUS
  Filled 2012-09-07: qty 90

## 2012-09-07 NOTE — Progress Notes (Signed)
Patient took Zyrtec at home

## 2012-09-30 DIAGNOSIS — Z1231 Encounter for screening mammogram for malignant neoplasm of breast: Secondary | ICD-10-CM | POA: Diagnosis not present

## 2012-10-06 DIAGNOSIS — R928 Other abnormal and inconclusive findings on diagnostic imaging of breast: Secondary | ICD-10-CM | POA: Diagnosis not present

## 2012-10-11 ENCOUNTER — Telehealth: Payer: Self-pay | Admitting: Emergency Medicine

## 2012-10-11 MED ORDER — TIOTROPIUM BROMIDE MONOHYDRATE 18 MCG IN CAPS: ORAL_CAPSULE | RESPIRATORY_TRACT | Status: DC

## 2012-10-11 NOTE — Telephone Encounter (Signed)
Pt aware 1 sample left upfront for pick up since we do not have many in. Nothing further needed

## 2012-10-12 ENCOUNTER — Encounter (HOSPITAL_COMMUNITY)
Admission: RE | Admit: 2012-10-12 | Discharge: 2012-10-12 | Disposition: A | Discharge status: Home / Self Care | Payer: Medicare Other | Source: Ambulatory Visit | Attending: Internal Medicine | Admitting: Internal Medicine

## 2012-10-12 DIAGNOSIS — M069 Rheumatoid arthritis, unspecified: Secondary | ICD-10-CM | POA: Diagnosis not present

## 2012-10-12 MED ORDER — SODIUM CHLORIDE 0.9 % IV SOLN
8.0000 mg/kg | Freq: Once | INTRAVENOUS | Status: AC
  Administered 2012-10-12: 900 mg via INTRAVENOUS
  Filled 2012-10-12: qty 90

## 2012-10-12 MED ORDER — SODIUM CHLORIDE 0.9 % IV SOLN
INTRAVENOUS | Status: DC
  Administered 2012-10-12: 11:00:00 via INTRAVENOUS

## 2012-10-25 DIAGNOSIS — J9801 Acute bronchospasm: Secondary | ICD-10-CM | POA: Diagnosis not present

## 2012-10-25 DIAGNOSIS — J209 Acute bronchitis, unspecified: Secondary | ICD-10-CM | POA: Diagnosis not present

## 2012-10-27 ENCOUNTER — Telehealth: Payer: Self-pay | Admitting: Emergency Medicine

## 2012-10-27 DIAGNOSIS — Z23 Encounter for immunization: Secondary | ICD-10-CM | POA: Diagnosis not present

## 2012-10-27 NOTE — Telephone Encounter (Signed)
Pt requested a Spiriva sample.  Advised pt would leave sample at the front dest to be picked up

## 2012-11-01 DIAGNOSIS — J45909 Unspecified asthma, uncomplicated: Secondary | ICD-10-CM | POA: Diagnosis not present

## 2012-11-02 ENCOUNTER — Ambulatory Visit
Admission: RE | Admit: 2012-11-02 | Discharge: 2012-11-02 | Disposition: A | Discharge status: Home / Self Care | Payer: Medicare Other | Source: Ambulatory Visit | Attending: Family Medicine | Admitting: Family Medicine

## 2012-11-02 ENCOUNTER — Other Ambulatory Visit: Payer: Self-pay | Admitting: Family Medicine

## 2012-11-02 DIAGNOSIS — R062 Wheezing: Secondary | ICD-10-CM

## 2012-11-02 DIAGNOSIS — R05 Cough: Secondary | ICD-10-CM

## 2012-11-02 DIAGNOSIS — J984 Other disorders of lung: Secondary | ICD-10-CM | POA: Diagnosis not present

## 2012-11-02 DIAGNOSIS — R059 Cough, unspecified: Secondary | ICD-10-CM

## 2012-11-16 ENCOUNTER — Encounter (HOSPITAL_COMMUNITY)
Admission: RE | Admit: 2012-11-16 | Discharge: 2012-11-16 | Disposition: A | Discharge status: Home / Self Care | Payer: Medicare Other | Source: Ambulatory Visit | Attending: Internal Medicine | Admitting: Internal Medicine

## 2012-11-16 DIAGNOSIS — M069 Rheumatoid arthritis, unspecified: Secondary | ICD-10-CM | POA: Insufficient documentation

## 2012-11-23 ENCOUNTER — Ambulatory Visit (INDEPENDENT_AMBULATORY_CARE_PROVIDER_SITE_OTHER): Payer: Medicare Other | Admitting: Podiatry

## 2012-11-23 ENCOUNTER — Encounter: Payer: Self-pay | Admitting: Podiatry

## 2012-11-23 VITALS — BP 160/87 | HR 89 | Resp 16

## 2012-11-23 DIAGNOSIS — B351 Tinea unguium: Secondary | ICD-10-CM | POA: Diagnosis not present

## 2012-11-23 DIAGNOSIS — M069 Rheumatoid arthritis, unspecified: Secondary | ICD-10-CM | POA: Diagnosis not present

## 2012-11-23 DIAGNOSIS — M159 Polyosteoarthritis, unspecified: Secondary | ICD-10-CM | POA: Diagnosis not present

## 2012-11-23 DIAGNOSIS — M79609 Pain in unspecified limb: Secondary | ICD-10-CM

## 2012-11-23 DIAGNOSIS — Z23 Encounter for immunization: Secondary | ICD-10-CM | POA: Diagnosis not present

## 2012-11-23 DIAGNOSIS — M810 Age-related osteoporosis without current pathological fracture: Secondary | ICD-10-CM | POA: Diagnosis not present

## 2012-11-23 NOTE — Progress Notes (Signed)
Presents today with a chief complaint of painful toenails bilateral.  Objective: Vital signs are stable alert oriented x3. Nails are thick yellow dystrophic clinically mycotic and painful on palpation.  Assessment: Pain in limb secondary to onychomycosis bilateral.  Plan: Debridement of nails in thickness and length as a covered service secondary to pain followup with him in 3 months.

## 2012-11-25 ENCOUNTER — Telehealth: Payer: Self-pay | Admitting: Emergency Medicine

## 2012-11-25 NOTE — Telephone Encounter (Signed)
Samples are up front for pick up. Pt is aware. 

## 2012-11-29 ENCOUNTER — Other Ambulatory Visit (HOSPITAL_COMMUNITY): Payer: Self-pay | Admitting: *Deleted

## 2012-11-30 ENCOUNTER — Encounter (HOSPITAL_COMMUNITY)
Admission: RE | Admit: 2012-11-30 | Discharge: 2012-11-30 | Disposition: A | Discharge status: Home / Self Care | Payer: Medicare Other | Source: Ambulatory Visit | Attending: Internal Medicine | Admitting: Internal Medicine

## 2012-11-30 DIAGNOSIS — M069 Rheumatoid arthritis, unspecified: Secondary | ICD-10-CM | POA: Diagnosis not present

## 2012-11-30 MED ORDER — SODIUM CHLORIDE 0.9 % IV SOLN
8.0000 mg/kg | INTRAVENOUS | Status: DC
  Administered 2012-11-30: 900 mg via INTRAVENOUS
  Filled 2012-11-30: qty 90

## 2012-11-30 MED ORDER — SODIUM CHLORIDE 0.9 % IV SOLN
INTRAVENOUS | Status: DC
  Administered 2012-11-30: 250 mL via INTRAVENOUS

## 2012-11-30 MED ORDER — LORATADINE 10 MG PO TABS: 10.0000 mg | ORAL_TABLET | Freq: Every day | ORAL | Status: DC

## 2012-11-30 NOTE — Progress Notes (Signed)
22 g angiocath inserted in left hand on 1st attempt. Site [place]. Tolerated well.

## 2013-01-04 ENCOUNTER — Ambulatory Visit (INDEPENDENT_AMBULATORY_CARE_PROVIDER_SITE_OTHER): Payer: Medicare Other | Admitting: Internal Medicine

## 2013-01-04 ENCOUNTER — Ambulatory Visit (INDEPENDENT_AMBULATORY_CARE_PROVIDER_SITE_OTHER)
Admission: RE | Admit: 2013-01-04 | Discharge: 2013-01-04 | Disposition: A | Discharge status: Home / Self Care | Payer: Medicare Other | Source: Ambulatory Visit | Attending: Internal Medicine | Admitting: Internal Medicine

## 2013-01-04 ENCOUNTER — Encounter: Payer: Self-pay | Admitting: Internal Medicine

## 2013-01-04 ENCOUNTER — Encounter (HOSPITAL_COMMUNITY)
Admission: RE | Admit: 2013-01-04 | Discharge: 2013-01-04 | Disposition: A | Discharge status: Home / Self Care | Payer: Medicare Other | Source: Ambulatory Visit | Attending: Internal Medicine | Admitting: Internal Medicine

## 2013-01-04 VITALS — BP 140/88 | HR 104 | Temp 98.0°F | Ht 61.75 in | Wt 235.0 lb

## 2013-01-04 DIAGNOSIS — R05 Cough: Secondary | ICD-10-CM

## 2013-01-04 DIAGNOSIS — R059 Cough, unspecified: Secondary | ICD-10-CM | POA: Diagnosis not present

## 2013-01-04 DIAGNOSIS — R0609 Other forms of dyspnea: Secondary | ICD-10-CM

## 2013-01-04 DIAGNOSIS — R918 Other nonspecific abnormal finding of lung field: Secondary | ICD-10-CM

## 2013-01-04 DIAGNOSIS — R0989 Other specified symptoms and signs involving the circulatory and respiratory systems: Secondary | ICD-10-CM

## 2013-01-04 DIAGNOSIS — M069 Rheumatoid arthritis, unspecified: Secondary | ICD-10-CM | POA: Diagnosis not present

## 2013-01-04 DIAGNOSIS — R0600 Dyspnea, unspecified: Secondary | ICD-10-CM

## 2013-01-04 DIAGNOSIS — R0602 Shortness of breath: Secondary | ICD-10-CM | POA: Diagnosis not present

## 2013-01-04 MED ORDER — FAMOTIDINE 20 MG PO TABS: ORAL_TABLET | ORAL | Status: DC

## 2013-01-04 MED ORDER — PANTOPRAZOLE SODIUM 40 MG PO TBEC: 40.0000 mg | DELAYED_RELEASE_TABLET | Freq: Every day | ORAL | Status: DC

## 2013-01-04 MED ORDER — SODIUM CHLORIDE 0.9 % IV SOLN
8.0000 mg/kg | INTRAVENOUS | Status: DC
  Administered 2013-01-04: 900 mg via INTRAVENOUS
  Filled 2013-01-04: qty 90

## 2013-01-04 MED ORDER — OLMESARTAN MEDOXOMIL 20 MG PO TABS: 20.0000 mg | ORAL_TABLET | Freq: Every day | ORAL | Status: DC

## 2013-01-04 MED ORDER — METHYLPREDNISOLONE ACETATE 80 MG/ML IJ SUSP
120.0000 mg | Freq: Once | INTRAMUSCULAR | Status: AC
  Administered 2013-01-04: 120 mg via INTRAMUSCULAR

## 2013-01-04 MED ORDER — SODIUM CHLORIDE 0.9 % IV SOLN
INTRAVENOUS | Status: DC
  Administered 2013-01-04: 10:00:00 via INTRAVENOUS

## 2013-01-04 MED ORDER — LORATADINE 10 MG PO TABS: 10.0000 mg | ORAL_TABLET | Freq: Every day | ORAL | Status: DC

## 2013-01-04 NOTE — Patient Instructions (Addendum)
Continue omeprazole  40 mg  Take 30-60 min before first meal of the day and Pepcid 20 mg one bedtime until return to office - this is the best way to tell whether stomach acid is contributing to your problem.   Stop cozaar and restart benicar 40 mg-TAKE 1/2 TABLET DAILY  GERD (REFLUX)  is an extremely common cause of respiratory symptoms, many times with no significant heartburn at all.    It can be treated with medication, but also with lifestyle changes including avoidance of late meals, excessive alcohol, smoking cessation, and avoid fatty foods, chocolate, peppermint, colas, red wine, and acidic juices such as orange juice.  NO MINT OR MENTHOL PRODUCTS SO NO COUGH DROPS  USE SUGARLESS CANDY INSTEAD (jolley ranchers or Stover's)  NO OIL BASED VITAMINS - use powdered substitutes.    Please remember to go to the  x-ray department downstairs for your tests - we will call you with the results when they are available.  Please schedule a follow up office visit in 4 weeks, sooner if needed with Dr Delton Coombes  Late add:  New changes on cxr so move up to 2 week f/u with Tammy NP or Byrum

## 2013-01-04 NOTE — Progress Notes (Signed)
Quick Note:  Spoke with pt and notified of results per Dr. Wert. Pt verbalized understanding and denied any questions.  ______ 

## 2013-01-04 NOTE — Progress Notes (Signed)
72 yo woman, former smoker, with obesity, HTN, RA, allergic rhinitis, OSA dx by PSG not currently on CPAP due to intolerance of high pressure. Referred for evaluation for chronic cough.   Has been seen for chronic cough by Dr Lady Deutscher for cough over the last 2 yrs. Prior to that she did not cough frequently, only with URIs. She reports persistent cough for about 2 yrs, usuallly non-prod but sometimes white to clear, appears to be worse during the winter months. Has also had PND and allergies that have remained bothersome. Was tried on zyrtec, also on Advair, QVAR at one point, not on these currently. Was treated for the last 2 months empirically for GERD with lansoprasole, off it now. She sometimes wakes at night with cough, PND. No real voice changes, some globus sensation and throat clearing.   ROV 07/20/09 -- returns for cough and exertional dyspnea. We started loratadine + fluticasone + NSWs, also omeprazole. Cough is better, still with nasal congestion and some gtt.   ROV 11/22/10 -- Seen previously for dyspnea and chronic cough.Also hx untreated OSA.  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 Her cough got better with rx PND and GERD, she was seen by Dr Wilburn Cornelia - eval consistent w GERD related cough.  Current regimen = omeprazole (stopped fluticasone and loratadine). Her cough is better now, she believes that stopping Ca has made this better. Still has a little cough.  Her biggest complaint is progressive exertional dyspnea since July '12. Notices it with walking. Her eval has included clear CXR's, normal ECG.  She had a stress test in 2002 (?). Her wt has been stable.  She was told to go back on QVAR 2 weeks ago -> no change in her SOB. Has not used SABA.  She still believes that she cannot do CPAP due to the high required pressures. She has never seen a cardiologist formally, interested in seeing Dr Stanford Breed.   ROV 12/27/10 -- Returns for evaluation exertional  SOB in setting mixed disease, OSA (not on CPAP). We started Spiriva about 6 weeks ago, but she isn't sure that it has really helped. Maybe a small benefit in that it takes shorter time to recover. Use albuterol prn and finds that this helps her more. She saw Dr Stanford Breed as planned, underwent stress testing as below. Cough is stable, no better or worse.   Nuclear stress testing 12/20 by Dr Stanford Breed showed abnormal stress nuclear study with a small partially reversible distal anterior defect suggestive of soft tissue attenuation and mild ischemia.  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 Followed by Dr Stanford Breed, had a Myoview w possible evidence anterior ischemia. Underwent R and L heart cath on 1/23. RVP 46/8/16, PAP 40/16/26, PAOP 18/15/13.  I can't find the L cath results, but pt tells me that she was told that she didn't have any coronary occlusions. She is willing to go back to the sleep lab for titration and mask fitting. Still taking Spiriva, may be helping some but remains SOB. She isn't sure that the financial cost is worth the benefit.   ROV 05/05/11 -- OSA, mild AFL with marginal BD response, diastolic dysfxn. Since last visit we started CPAP - she is wearing and tolerating. She is using full face mask - dislikes it but is able to do it. She has more energy, feels better. She tried stopping Spiriva  to see if she would miss it - she may have missed it some, she has not restarted it. She went to nutritionist, has lost some wt.   ROV 11/03/11 -- OSA, mild AFL with marginal BD response, diastolic dysfxn. Since last time she has taken a break from her CPAP - hasn;t used for about 2 months due to mask difficulties and the fact that she doesn;t like it. She got out of the habit. She is willing and wants to get back on it - not sure that she misses it, but maybe was more rested. She has lost some more wt.  She remains on Spiriva, is getting  samples.   ROV 05/04/12 -- Hx RA, OSA, mild AFL with marginal BD response, diastolic dysfxn, borderline secondary PAH. She is using her zyrtec and fluticasone nasal spray. Her cough is better, breathing is better. She remains on omeprazole. She was not able/willing to get back to regular CPAP. She has lost approx 30 lbs over 9-12 months. MTX and remicade for her RA. Last CXR was 1/'13, no ILD. She is on Spiriva, uses albuterol very rarely. No AE reported.  rec Please continue Spiriva daily Use albuterol 2 puffs if needed for shortness of breath Keep up the good work with your weight loss Continue your fluticasone spray., zyrtec and omeprazole   01/04/2013  Acute ov/Taylor Berry re: sob > cough  Chief Complaint  Patient presents with  . Acute Visit    Pt c/o increased DOE for the past 2 wks. She also c/o occ cough- prod with minimal white sputum-she relates cough to PND. She has used rescue inhaler twice in the past 2 wks.   chronic cough worsened in October but not seen here but treated for "bronchitis" and gradually improved over a month  but then first of December again much  worse cough white mucus, worse at hs, has not seen anyone since worse or changed meds which include spiriva which "helps a lot" (interestingly only on samples x 6 months but "never misses a dose" so can't really tell whether cough or breathing are worse off it). Overall RA control "avg"  No obvious day to day or daytime variabilty or assoc chronic cough or cp or chest tightness, subjective wheeze overt sinus or hb symptoms. No unusual exp hx or h/o childhood pna/ asthma or knowledge of premature birth.  Sleeping ok without nocturnal  or early am exacerbation  of respiratory  c/o's or need for noct saba. Also denies any obvious fluctuation of symptoms with weather or environmental changes or other aggravating or alleviating factors except as outlined above   Current Medications, Allergies, Complete Past Medical History, Past  Surgical History, Family History, and Social History were reviewed in [employer] record.  ROS  The following are not active complaints unless bolded sore throat, dysphagia, dental problems, itching, sneezing,  nasal congestion or excess/ purulent secretions, ear ache,   fever, chills, sweats, unintended wt loss, pleuritic or exertional cp, hemoptysis,  orthopnea pnd or leg swelling, presyncope, palpitations, heartburn, abdominal pain, anorexia, nausea, vomiting, diarrhea  or change in bowel or urinary habits, change in stools or urine, dysuria,hematuria,  rash, arthralgias, visual complaints, headache, numbness weakness or ataxia or problems with walking or coordination,  change in mood/affect or memory.         EXAM:  Wt Readings from Last 3 Encounters:  01/04/13 235 lb (106.595 kg)  01/04/13 230 lb (104.327 kg)  11/30/12 231 lb (104.781 kg)  Gen: Pleasant, obese, in no distress,  Evasive with questions re symptoms, prefers using medical dx's  ENT: No lesions,  mouth clear,  oropharynx clear, no postnasal drip  Neck: No JVD, no TMG, no carotid bruits  Lungs: No use of accessory muscles, no dullness to percussion, clear without rales or rhonchi  Cardiovascular: RRR, heart sounds normal, no murmur or gallops, no peripheral edema  Musculoskeletal: No deformities, no cyanosis or clubbing  Neuro: alert, non focal  Skin: Warm, no lesions or rashes    CXR  01/04/2013 :  Cardiomegaly without congestive failure.  Persistent bibasilar opacities. Favored to represent scarring. When compared to 05/04/2012, the left base opacity is slightly progressive. Residual or recurrent infection since 11/02/2012 cannot be excluded. Consider short term radiographic followup.  Subtle left mid lung opacity laterally. Cannot exclude pulmonary nodule. Potential clinical strategies include radiographic followup

## 2013-01-05 ENCOUNTER — Telehealth: Payer: Self-pay | Admitting: *Deleted

## 2013-01-05 DIAGNOSIS — R918 Other nonspecific abnormal finding of lung field: Secondary | ICD-10-CM | POA: Insufficient documentation

## 2013-01-05 NOTE — Telephone Encounter (Signed)
Spoke with pt. She is scheduled to come in and see TP 01/21/13 at 2 pm. Nothing further needed

## 2013-01-05 NOTE — Assessment & Plan Note (Addendum)
The most common causes of chronic cough in immunocompetent adults include the following: upper airway cough syndrome (UACS), previously referred to as postnasal drip syndrome (PNDS), which is caused by variety of rhinosinus conditions; (2) asthma; (3) GERD; (4) chronic bronchitis from cigarette smoking or other inhaled environmental irritants; (5) nonasthmatic eosinophilic bronchitis; and (6) bronchiectasis.   These conditions, singly or in combination, have accounted for up to 94% of the causes of chronic cough in prospective studies.   Other conditions have constituted no >6% of the causes in prospective studies These have included bronchogenic carcinoma, chronic interstitial pneumonia, sarcoidosis, left ventricular failure, ACEI-induced cough, and aspiration from a condition associated with pharyngeal dysfunction.    Chronic cough is often simultaneously caused by more than one condition. A single cause has been found from 38 to 82% of the time, multiple causes from 18 to 62%. Multiply caused cough has been the result of three diseases up to 42% of the time.       Most likley this is  Classic Upper airway cough syndrome, so named because it's frequently impossible to sort out how much is  CR/sinusitis with freq throat clearing (which can be related to primary GERD)   vs  causing  secondary (" extra esophageal")  GERD from wide swings in gastric pressure that occur with throat clearing, often  promoting self use of mint and menthol lozenges that reduce the lower esophageal sphincter tone and exacerbate the problem further in a cyclical fashion.   These are the same pts (now being labeled as having "irritable larynx syndrome" by some cough centers) who not infrequently have a history of having failed to tolerate ace inhibitors and now reported to also occur with generic cozar,  dry powder inhalers or biphosphonates or report having atypical reflux symptoms that don't respond to standard doses of PPI ,  and are easily confused as having aecopd or asthma flares by even experienced allergists/ pulmonologists.   rec try off cozar, on benicar and max rx for gerd then regroup in 4 weeks

## 2013-01-05 NOTE — Telephone Encounter (Signed)
Pt returned call

## 2013-01-05 NOTE — Telephone Encounter (Signed)
LMTCB for pt 

## 2013-01-05 NOTE — Assessment & Plan Note (Signed)
-   07/10/09 spirometry with minimal airflow obst/ disproportionate drop in ERV c/w obesity effects -01/04/2013   Walked RA x one lap @ 185 stopped due to  Sob, no desa  Not clear she needs to be on spiriva which is not helping her with her episodes of severe cough and via effects on upper airway may contribute to chronic/ recurrent cough but for now leave on it until she regroups with Dr Delton Coombes esp re continuous access to samples only.

## 2013-01-05 NOTE — Telephone Encounter (Signed)
Message copied by Christen Butter on Wed Jan 05, 2013 11:08 AM ------      Message from: Sandrea Hughs B      Created: Wed Jan 05, 2013  7:42 AM       Move up f/u ov with cxr 2 weeks Tammy NP or [place] ------

## 2013-01-05 NOTE — Assessment & Plan Note (Addendum)
With with RA on MTX so ddx is RA lung dz vs early mtx effects with no evidence at all to support active infection  rx depomedrol 120 mg IM x 1  only for now and regroup with Dr Delton Coombes in 2 weeks

## 2013-01-06 DIAGNOSIS — C3490 Malignant neoplasm of unspecified part of unspecified bronchus or lung: Secondary | ICD-10-CM

## 2013-01-06 DIAGNOSIS — D649 Anemia, unspecified: Secondary | ICD-10-CM

## 2013-01-06 HISTORY — DX: Malignant neoplasm of unspecified part of unspecified bronchus or lung: C34.90

## 2013-01-06 HISTORY — DX: Anemia, unspecified: D64.9

## 2013-01-21 ENCOUNTER — Encounter: Payer: Self-pay | Admitting: Adult Health

## 2013-01-21 ENCOUNTER — Ambulatory Visit (INDEPENDENT_AMBULATORY_CARE_PROVIDER_SITE_OTHER)
Admission: RE | Admit: 2013-01-21 | Discharge: 2013-01-21 | Disposition: A | Discharge status: Home / Self Care | Payer: Medicare Other | Source: Ambulatory Visit | Attending: Adult Health | Admitting: Adult Health

## 2013-01-21 ENCOUNTER — Ambulatory Visit (INDEPENDENT_AMBULATORY_CARE_PROVIDER_SITE_OTHER): Payer: Medicare Other | Admitting: Adult Health

## 2013-01-21 VITALS — BP 124/66 | HR 84 | Temp 97.8°F | Ht 61.75 in | Wt 229.0 lb

## 2013-01-21 DIAGNOSIS — R918 Other nonspecific abnormal finding of lung field: Secondary | ICD-10-CM | POA: Diagnosis not present

## 2013-01-21 DIAGNOSIS — J449 Chronic obstructive pulmonary disease, unspecified: Secondary | ICD-10-CM

## 2013-01-21 DIAGNOSIS — J9819 Other pulmonary collapse: Secondary | ICD-10-CM | POA: Diagnosis not present

## 2013-01-21 DIAGNOSIS — J4489 Other specified chronic obstructive pulmonary disease: Secondary | ICD-10-CM | POA: Diagnosis not present

## 2013-01-21 NOTE — Progress Notes (Signed)
73 yo woman, former smoker, with obesity, HTN, RA, allergic rhinitis, OSA dx by PSG not currently on CPAP due to intolerance of high pressure. Referred for evaluation for chronic cough.   Has been seen for chronic cough by Dr Lady Deutscher for cough over the last 2 yrs. Prior to that she did not cough frequently, only with URIs. She reports persistent cough for about 2 yrs, usuallly non-prod but sometimes white to clear, appears to be worse during the winter months. Has also had PND and allergies that have remained bothersome. Was tried on zyrtec, also on Advair, QVAR at one point, not on these currently. Was treated for the last 2 months empirically for GERD with lansoprasole, off it now. She sometimes wakes at night with cough, PND. No real voice changes, some globus sensation and throat clearing.   ROV 07/20/09 -- returns for cough and exertional dyspnea. We started loratadine + fluticasone + NSWs, also omeprazole. Cough is better, still with nasal congestion and some gtt.   ROV 11/22/10 -- Seen previously for dyspnea and chronic cough.Also hx untreated OSA.  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 Her cough got better with rx PND and GERD, she was seen by Dr Wilburn Cornelia - eval consistent w GERD related cough.  Current regimen = omeprazole (stopped fluticasone and loratadine). Her cough is better now, she believes that stopping Ca has made this better. Still has a little cough.  Her biggest complaint is progressive exertional dyspnea since July '12. Notices it with walking. Her eval has included clear CXR's, normal ECG.  She had a stress test in 2002 (?). Her wt has been stable.  She was told to go back on QVAR 2 weeks ago -> no change in her SOB. Has not used SABA.  She still believes that she cannot do CPAP due to the high required pressures. She has never seen a cardiologist formally, interested in seeing Dr Stanford Breed.   ROV 12/27/10 -- Returns for evaluation exertional  SOB in setting mixed disease, OSA (not on CPAP). We started Spiriva about 6 weeks ago, but she isn't sure that it has really helped. Maybe a small benefit in that it takes shorter time to recover. Use albuterol prn and finds that this helps her more. She saw Dr Stanford Breed as planned, underwent stress testing as below. Cough is stable, no better or worse.   Nuclear stress testing 12/20 by Dr Stanford Breed showed abnormal stress nuclear study with a small partially reversible distal anterior defect suggestive of soft tissue attenuation and mild ischemia.  ROV 02/03/11 -- OSA (not on CPAP), mild AFL with marginal response to BD, diastolic dysfxn (by TTE 16/10). Followed by Dr Stanford Breed, had a Myoview w possible evidence anterior ischemia. Underwent R and L heart cath on 1/23. RVP 46/8/16, PAP 40/16/26, PAOP 18/15/13.  I can't find the L cath results, but pt tells me that she was told that she didn't have any coronary occlusions. She is willing to go back to the sleep lab for titration and mask fitting. Still taking Spiriva, may be helping some but remains SOB. She isn't sure that the financial cost is worth the benefit.   ROV 05/05/11 -- OSA, mild AFL with marginal BD response, diastolic dysfxn. Since last visit we started CPAP - she is wearing and tolerating. She is using full face mask - dislikes it but is able to do it. She has more energy, feels better. She tried stopping Spiriva  to see if she would miss it - she may have missed it some, she has not restarted it. She went to nutritionist, has lost some wt.   ROV 11/03/11 -- OSA, mild AFL with marginal BD response, diastolic dysfxn. Since last time she has taken a break from her CPAP - hasn;t used for about 2 months due to mask difficulties and the fact that she doesn;t like it. She got out of the habit. She is willing and wants to get back on it - not sure that she misses it, but maybe was more rested. She has lost some more wt.  She remains on Spiriva, is getting  samples.   ROV 05/04/12 -- Hx RA, OSA, mild AFL with marginal BD response, diastolic dysfxn, borderline secondary PAH. She is using her zyrtec and fluticasone nasal spray. Her cough is better, breathing is better. She remains on omeprazole. She was not able/willing to get back to regular CPAP. She has lost approx 30 lbs over 9-12 months. MTX and remicade for her RA. Last CXR was 1/'13, no ILD. She is on Spiriva, uses albuterol very rarely. No AE reported.  rec Please continue Spiriva daily Use albuterol 2 puffs if needed for shortness of breath Keep up the good work with your weight loss Continue your fluticasone spray., zyrtec and omeprazole   01/04/2013  Acute ov/[facility] re: sob > cough  Chief Complaint  Patient presents with  . Acute Visit    Pt c/o increased DOE for the past 2 wks. She also c/o occ cough- prod with minimal white sputum-she relates cough to PND. She has used rescue inhaler twice in the past 2 wks.   chronic cough worsened in October but not seen here but treated for "bronchitis" and gradually improved over a month  but then first of December again much  worse cough white mucus, worse at hs, has not seen anyone since worse or changed meds which include spiriva which "helps a lot" (interestingly only on samples x 6 months but "never misses a dose" so can't really tell whether cough or breathing are worse off it). Overall RA control "avg" >>change cozaar to benicar, add pepcid At bedtime    01/21/2013 Follow up  Breathing is improved. Still reports slight SOB from time to time. Cough is resolved since changing BP med. Last ov cxr showed bibasilar opacities unchanged from 04/2012.  Mid lung opacity on left .  No weight loss, chest pain, orthopnea, edema, fever.    Current Medications, Allergies, Complete Past Medical History, Past Surgical History, Family History, and Social History were reviewed in [employer] record.  ROS  The following are not active  complaints unless bolded sore throat, dysphagia, dental problems, itching, sneezing,  nasal congestion or excess/ purulent secretions, ear ache,   fever, chills, sweats, unintended wt loss, pleuritic or exertional cp, hemoptysis,  orthopnea pnd or leg swelling, presyncope, palpitations, heartburn, abdominal pain, anorexia, nausea, vomiting, diarrhea  or change in bowel or urinary habits, change in stools or urine, dysuria,hematuria,  rash, arthralgias, visual complaints, headache, numbness weakness or ataxia or problems with walking or coordination,  change in mood/affect or memory.         EXAM:  Gen: Pleasant, obese, in no distress,    ENT: No lesions,  mouth clear,  oropharynx clear, no postnasal drip  Neck: No JVD, no TMG, no carotid bruits  Lungs: No use of accessory muscles, no dullness to percussion, clear without rales or rhonchi  Cardiovascular:  RRR, heart sounds normal, no murmur or gallops, no peripheral edema  Musculoskeletal: No deformities, no cyanosis or clubbing  Neuro: alert, non focal  Skin: Warm, no lesions or rashes    CXR  01/04/2013 :  Cardiomegaly without congestive failure.  Persistent bibasilar opacities. Favored to represent scarring. When compared to 05/04/2012, the left base opacity is slightly progressive. Residual or recurrent infection since 11/02/2012 cannot be excluded. Consider short term radiographic followup.  Subtle left mid lung opacity laterally. Cannot exclude pulmonary nodule. Potential clinical strategies include radiographic followup

## 2013-01-21 NOTE — Patient Instructions (Signed)
Continue on current regimen  Chest xray today  Follow up Dr. Lamonte Sakai  In 4 weeks and As needed

## 2013-01-25 ENCOUNTER — Telehealth: Payer: Self-pay | Admitting: Adult Health

## 2013-01-25 DIAGNOSIS — R918 Other nonspecific abnormal finding of lung field: Secondary | ICD-10-CM

## 2013-01-25 NOTE — Telephone Encounter (Signed)
Pt requesting CXR from 01/21/13. Please advise TP thanks

## 2013-01-25 NOTE — Telephone Encounter (Signed)
Needs CT chest w /contrast done to look at lung opacities  on left  Pt aware  Will need BMET and CT set up

## 2013-01-26 NOTE — Telephone Encounter (Signed)
Orders placed for CT and BMET. Will sign off.

## 2013-01-27 ENCOUNTER — Telehealth: Payer: Self-pay | Admitting: Internal Medicine

## 2013-01-27 ENCOUNTER — Other Ambulatory Visit (INDEPENDENT_AMBULATORY_CARE_PROVIDER_SITE_OTHER): Payer: Medicare Other

## 2013-01-27 DIAGNOSIS — R918 Other nonspecific abnormal finding of lung field: Secondary | ICD-10-CM | POA: Diagnosis not present

## 2013-01-27 LAB — BASIC METABOLIC PANEL
BUN: 16 mg/dL (ref 6–23)
CO2: 28 mEq/L (ref 19–32)
Calcium: 9.5 mg/dL (ref 8.4–10.5)
Chloride: 103 mEq/L (ref 96–112)
Creatinine, Ser: 0.8 mg/dL (ref 0.4–1.2)
GFR: 73.81 mL/min (ref >60.00)
Glucose, Bld: 107 mg/dL — ABNORMAL HIGH (ref 70–99)
Potassium: 4.4 mEq/L (ref 3.5–5.1)
Sodium: 138 mEq/L (ref 135–145)

## 2013-01-27 MED ORDER — OLMESARTAN MEDOXOMIL 40 MG PO TABS
ORAL_TABLET | ORAL | Status: DC
Start: 2013-01-27 — End: 2013-02-28

## 2013-01-27 NOTE — Assessment & Plan Note (Signed)
Repeat cxr - showed no changes w/ persistent bibasilar opacities and mid lung nodule/opacity Check CT chest >

## 2013-01-27 NOTE — Assessment & Plan Note (Signed)
Continue on current regimen .   

## 2013-01-27 NOTE — Telephone Encounter (Signed)
Spoke with pharmacist at [facility].  Pt was there to pick up rx for Benicar.  Rx called in per Dr Gustavus Bryant last ov note.

## 2013-02-01 ENCOUNTER — Ambulatory Visit: Payer: Medicare Other | Admitting: Emergency Medicine

## 2013-02-01 ENCOUNTER — Ambulatory Visit (INDEPENDENT_AMBULATORY_CARE_PROVIDER_SITE_OTHER)
Admission: RE | Admit: 2013-02-01 | Discharge: 2013-02-01 | Disposition: A | Discharge status: Home / Self Care | Payer: Medicare Other | Source: Ambulatory Visit | Attending: Adult Health | Admitting: Adult Health

## 2013-02-01 DIAGNOSIS — R918 Other nonspecific abnormal finding of lung field: Secondary | ICD-10-CM | POA: Diagnosis not present

## 2013-02-01 DIAGNOSIS — R05 Cough: Secondary | ICD-10-CM | POA: Diagnosis not present

## 2013-02-01 DIAGNOSIS — R059 Cough, unspecified: Secondary | ICD-10-CM | POA: Diagnosis not present

## 2013-02-01 MED ORDER — IOHEXOL 300 MG/ML  SOLN
80.0000 mL | Freq: Once | INTRAMUSCULAR | Status: AC | PRN
  Administered 2013-02-01: 80 mL via INTRAVENOUS

## 2013-02-02 ENCOUNTER — Telehealth: Payer: Self-pay | Admitting: Emergency Medicine

## 2013-02-02 NOTE — Telephone Encounter (Signed)
TP not in office this afternoon but will return to the office tomorrow morning Called spoke with patient and informed her of this - she is okay with call back in the morning  TP please advise, thank you.

## 2013-02-03 ENCOUNTER — Encounter: Payer: Self-pay | Admitting: Adult Health

## 2013-02-03 ENCOUNTER — Other Ambulatory Visit: Payer: Self-pay | Admitting: Adult Health

## 2013-02-03 DIAGNOSIS — C3410 Malignant neoplasm of upper lobe, unspecified bronchus or lung: Secondary | ICD-10-CM | POA: Insufficient documentation

## 2013-02-03 DIAGNOSIS — R911 Solitary pulmonary nodule: Secondary | ICD-10-CM

## 2013-02-03 NOTE — Progress Notes (Signed)
Result Notes    Notes Recorded by Rinaldo Ratel, CMA on 02/03/2013 at 12:24 PM Orders only encounter created for PET scan order. ------  Notes Recorded by Melvenia Needles, NP on 02/03/2013 at 12:00 PM Needs PET scan -LUL nodule  Discussed results with pt and she is aware  Please set up for her  Make sure she keeps follow up ov with Dr. Lamonte Sakai To discuss in 2 weeks    ======================= PET scan ordered.  Once scheduled, will call pt for 2 week follow up w/ Dr Lamonte Sakai.

## 2013-02-03 NOTE — Progress Notes (Signed)
Quick Note:  Orders only encounter created for PET scan order. ______

## 2013-02-03 NOTE — Telephone Encounter (Signed)
Spoke with pt with results

## 2013-02-04 NOTE — Progress Notes (Signed)
Quick Note:  Pt already has appt scheduled with RB on 2.13.15. ______

## 2013-02-07 ENCOUNTER — Telehealth: Payer: Self-pay | Admitting: Emergency Medicine

## 2013-02-07 NOTE — Telephone Encounter (Signed)
I called and spoke with pt. Advised her she needed to call PCP regarding BP medication and anti medication. Also she reports she will start taking vit c since she can eat citrus fruit d/t acid reflux. Nothing further needed

## 2013-02-08 ENCOUNTER — Encounter (HOSPITAL_COMMUNITY)
Admission: RE | Admit: 2013-02-08 | Discharge: 2013-02-08 | Disposition: A | Discharge status: Home / Self Care | Payer: Medicare Other | Source: Ambulatory Visit | Attending: Internal Medicine | Admitting: Internal Medicine

## 2013-02-08 ENCOUNTER — Telehealth: Payer: Self-pay | Admitting: Emergency Medicine

## 2013-02-08 DIAGNOSIS — M069 Rheumatoid arthritis, unspecified: Secondary | ICD-10-CM | POA: Diagnosis not present

## 2013-02-08 MED ORDER — SODIUM CHLORIDE 0.9 % IV SOLN
INTRAVENOUS | Status: DC
  Administered 2013-02-08: 250 mL via INTRAVENOUS

## 2013-02-08 MED ORDER — SODIUM CHLORIDE 0.9 % IV SOLN
8.0000 mg/kg | Freq: Once | INTRAVENOUS | Status: AC
  Administered 2013-02-08: 800 mg via INTRAVENOUS
  Filled 2013-02-08: qty 80

## 2013-02-08 MED ORDER — ALPRAZOLAM 1 MG PO TABS: ORAL_TABLET | ORAL | Status: DC

## 2013-02-08 MED ORDER — SODIUM CHLORIDE 0.9 % IV SOLN: 8.0000 mg | Freq: Once | INTRAVENOUS | Status: DC

## 2013-02-08 NOTE — Telephone Encounter (Signed)
Pt is aware that we will call in Xanax. She will have someone drive her there and back home.

## 2013-02-08 NOTE — Telephone Encounter (Signed)
Pt states that she would like to speak w/ TP if at all possible regarding her upcoming procedure.  Pt states that whomever she spoke w/ earlier spoke to her as if they thought she is crazy & asks to be reached at [phone].  Satira Anis

## 2013-02-08 NOTE — Telephone Encounter (Signed)
[email or web address].Jennifer Castillo, CMA  

## 2013-02-08 NOTE — Telephone Encounter (Signed)
Called and spoke with pt. She is scheduled to have PET scan next week. She reports she is claustrophobic. She wants to know if her PCP prescribed an anti anxiety medication would RB be okay if she took this the day of the scan. Please advise thanks

## 2013-02-08 NOTE — Telephone Encounter (Signed)
I called and spoke with pt. Made her aware we are asking RB if okay for the anti anxiety medications. She reports she just wants [place] to understanding she can;t stay still for that long. Please advise RB thanks

## 2013-02-08 NOTE — Telephone Encounter (Signed)
Pt calling back [phone]

## 2013-02-08 NOTE — Telephone Encounter (Signed)
You can give her xanax 1mg  x 1 to be taken 20 minutes before the MRI as long as she agrees to have someone drive her to and from the test.

## 2013-02-17 ENCOUNTER — Ambulatory Visit: Payer: No Typology Code available for payment source | Admitting: Podiatry

## 2013-02-17 ENCOUNTER — Encounter (HOSPITAL_COMMUNITY)
Admission: RE | Admit: 2013-02-17 | Discharge: 2013-02-17 | Disposition: A | Discharge status: Home / Self Care | Payer: Medicare Other | Source: Ambulatory Visit | Attending: Emergency Medicine | Admitting: Emergency Medicine

## 2013-02-17 DIAGNOSIS — R911 Solitary pulmonary nodule: Secondary | ICD-10-CM

## 2013-02-17 LAB — GLUCOSE, CAPILLARY: Glucose-Capillary: 111 mg/dL — ABNORMAL HIGH (ref 70–99)

## 2013-02-17 MED ORDER — FLUDEOXYGLUCOSE F - 18 (FDG) INJECTION
12.0000 | Freq: Once | INTRAVENOUS | Status: AC | PRN
  Administered 2013-02-17: 12 via INTRAVENOUS

## 2013-02-18 ENCOUNTER — Ambulatory Visit (INDEPENDENT_AMBULATORY_CARE_PROVIDER_SITE_OTHER): Payer: Medicare Other | Admitting: Adult Health

## 2013-02-18 ENCOUNTER — Ambulatory Visit: Payer: Medicare Other | Admitting: Emergency Medicine

## 2013-02-18 ENCOUNTER — Encounter: Payer: Self-pay | Admitting: Adult Health

## 2013-02-18 ENCOUNTER — Ambulatory Visit: Payer: Medicare Other | Admitting: Internal Medicine

## 2013-02-18 ENCOUNTER — Ambulatory Visit (HOSPITAL_COMMUNITY): Payer: Medicare Other

## 2013-02-18 VITALS — BP 114/72 | HR 80 | Temp 98.8°F | Ht 61.75 in | Wt 231.4 lb

## 2013-02-18 DIAGNOSIS — R911 Solitary pulmonary nodule: Secondary | ICD-10-CM | POA: Diagnosis not present

## 2013-02-18 NOTE — Progress Notes (Signed)
73 yo woman, former smoker, with obesity, HTN, RA, allergic rhinitis, OSA dx by PSG not currently on CPAP due to intolerance of high pressure. Referred for evaluation for chronic cough.   Has been seen for chronic cough by Dr Lady Deutscher for cough over the last 2 yrs. Prior to that she did not cough frequently, only with URIs. She reports persistent cough for about 2 yrs, usuallly non-prod but sometimes white to clear, appears to be worse during the winter months. Has also had PND and allergies that have remained bothersome. Was tried on zyrtec, also on Advair, QVAR at one point, not on these currently. Was treated for the last 2 months empirically for GERD with lansoprasole, off it now. She sometimes wakes at night with cough, PND. No real voice changes, some globus sensation and throat clearing.   ROV 07/20/09 -- returns for cough and exertional dyspnea. We started loratadine + fluticasone + NSWs, also omeprazole. Cough is better, still with nasal congestion and some gtt.   ROV 11/22/10 -- Seen previously for dyspnea and chronic cough.Also hx untreated OSA.  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 Her cough got better with rx PND and GERD, she was seen by Dr Wilburn Cornelia - eval consistent w GERD related cough.  Current regimen = omeprazole (stopped fluticasone and loratadine). Her cough is better now, she believes that stopping Ca has made this better. Still has a little cough.  Her biggest complaint is progressive exertional dyspnea since July '12. Notices it with walking. Her eval has included clear CXR's, normal ECG.  She had a stress test in 2002 (?). Her wt has been stable.  She was told to go back on QVAR 2 weeks ago -> no change in her SOB. Has not used SABA.  She still believes that she cannot do CPAP due to the high required pressures. She has never seen a cardiologist formally, interested in seeing Dr Stanford Breed.   ROV 12/27/10 -- Returns for evaluation exertional  SOB in setting mixed disease, OSA (not on CPAP). We started Spiriva about 6 weeks ago, but she isn't sure that it has really helped. Maybe a small benefit in that it takes shorter time to recover. Use albuterol prn and finds that this helps her more. She saw Dr Stanford Breed as planned, underwent stress testing as below. Cough is stable, no better or worse.   Nuclear stress testing 12/20 by Dr Stanford Breed showed abnormal stress nuclear study with a small partially reversible distal anterior defect suggestive of soft tissue attenuation and mild ischemia.  ROV 02/03/11 -- OSA (not on CPAP), mild AFL with marginal response to BD, diastolic dysfxn (by TTE 16/10). Followed by Dr Stanford Breed, had a Myoview w possible evidence anterior ischemia. Underwent R and L heart cath on 1/23. RVP 46/8/16, PAP 40/16/26, PAOP 18/15/13.  I can't find the L cath results, but pt tells me that she was told that she didn't have any coronary occlusions. She is willing to go back to the sleep lab for titration and mask fitting. Still taking Spiriva, may be helping some but remains SOB. She isn't sure that the financial cost is worth the benefit.   ROV 05/05/11 -- OSA, mild AFL with marginal BD response, diastolic dysfxn. Since last visit we started CPAP - she is wearing and tolerating. She is using full face mask - dislikes it but is able to do it. She has more energy, feels better. She tried stopping Spiriva  to see if she would miss it - she may have missed it some, she has not restarted it. She went to nutritionist, has lost some wt.   ROV 11/03/11 -- OSA, mild AFL with marginal BD response, diastolic dysfxn. Since last time she has taken a break from her CPAP - hasn;t used for about 2 months due to mask difficulties and the fact that she doesn;t like it. She got out of the habit. She is willing and wants to get back on it - not sure that she misses it, but maybe was more rested. She has lost some more wt.  She remains on Spiriva, is getting  samples.   ROV 05/04/12 -- Hx RA, OSA, mild AFL with marginal BD response, diastolic dysfxn, borderline secondary PAH. She is using her zyrtec and fluticasone nasal spray. Her cough is better, breathing is better. She remains on omeprazole. She was not able/willing to get back to regular CPAP. She has lost approx 30 lbs over 9-12 months. MTX and remicade for her RA. Last CXR was 1/'13, no ILD. She is on Spiriva, uses albuterol very rarely. No AE reported.  rec Please continue Spiriva daily Use albuterol 2 puffs if needed for shortness of breath Keep up the good work with your weight loss Continue your fluticasone spray., zyrtec and omeprazole   01/04/2013  Acute ov/Wert re: sob > cough  Chief Complaint  Patient presents with  . Acute Visit    Pt c/o increased DOE for the past 2 wks. She also c/o occ cough- prod with minimal white sputum-she relates cough to PND. She has used rescue inhaler twice in the past 2 wks.   chronic cough worsened in October but not seen here but treated for "bronchitis" and gradually improved over a month  but then first of December again much  worse cough white mucus, worse at hs, has not seen anyone since worse or changed meds which include spiriva which "helps a lot" (interestingly only on samples x 6 months but "never misses a dose" so can't really tell whether cough or breathing are worse off it). Overall RA control "avg" >>change cozaar to benicar, add pepcid At bedtime    01/21/13  Follow up  Breathing is improved. Still reports slight SOB from time to time. Cough is resolved since changing BP med. Last ov cxr showed bibasilar opacities unchanged from 04/2012.  Mid lung opacity on left .  No weight loss, chest pain, orthopnea, edema, fever.  >CT chest   02/18/2013 Follow up to review PET scan  Pt returns to discuss recent abnormal CT scan.  She was noticed to have a mid lung opacity on left on cxr. Subsequent CT chest showed 1.4 x 1.3 cm LUL central cavitary  nodule.  She was referred for PET scan >Hypermetabolic irregular left upper lobe nodule is highly worrisome for primary bronchogenic carcinoma. . Reticular subpleural opacities in the lingula and lateral left lower lobe are mildly hypermetabolic but nonspecific Wt is down 10lbs  over last 1 year.  No chest pain,orthopena , edema or hemoptysis  Last PFT 2011 showed FEV1 ~64% , moderately decreased Diffucing capacity On Spiriva .  Works partime -going to retire this month.     Current Medications, Allergies, Complete Past Medical History, Past Surgical History, Family History, and Social History were reviewed in [employer] record.  ROS  The following are not active complaints unless bolded sore throat, dysphagia, dental problems, itching, sneezing,  nasal congestion or excess/  purulent secretions, ear ache,   fever, chills, sweats, unintended wt loss, pleuritic or exertional cp, hemoptysis,  orthopnea pnd or leg swelling, presyncope, palpitations, heartburn, abdominal pain, anorexia, nausea, vomiting, diarrhea  or change in bowel or urinary habits, change in stools or urine, dysuria,hematuria,  rash, arthralgias, visual complaints, headache, numbness weakness or ataxia or problems with walking or coordination,  change in mood/affect or memory.         EXAM:  Gen: Pleasant, obese, in no distress,    ENT: No lesions,  mouth clear,  oropharynx clear, no postnasal drip  Neck: No JVD, no TMG, no carotid bruits  Lungs: No use of accessory muscles, no dullness to percussion, clear without rales or rhonchi  Cardiovascular: RRR, heart sounds normal, no murmur or gallops, no peripheral edema  Musculoskeletal: No deformities, no cyanosis or clubbing  Neuro: alert, non focal  Skin: Warm, no lesions or rashes    CXR  01/04/2013 :  Cardiomegaly without congestive failure.  Persistent bibasilar opacities. Favored to represent scarring. When compared to 05/04/2012, the  left base opacity is slightly progressive. Residual or recurrent infection since 11/02/2012 cannot be excluded. Consider short term radiographic followup.  Subtle left mid lung opacity laterally. Cannot exclude pulmonary nodule. Potential clinical strategies include radiographic followup      CT chest 02/01/13 > The plain film abnormality corresponds to a cavitary somewhat irregular left upper lobe lung nodule. This is suspicious for a primary bronchogenic carcinoma, likely squamous cell type.This measures 1.4 x 1.3 cm   PET scan 04/15/71  Hypermetabolic irregular left upper lobe nodule is highly worrisome for primary bronchogenic carcinoma. . Reticular subpleural opacities in the lingula and lateral left lower lobe are mildly hypermetabolic but nonspecific. Findings do not appear masslike, but rather fibrotic. Difficult to definitively exclude malignancy. Continued attention on followup exams is warranted.

## 2013-02-18 NOTE — Patient Instructions (Signed)
Continue on current regimen  I will call you on 02/21/13 -Monday with our next step after I speak with Dr. Lamonte Sakai

## 2013-02-21 NOTE — Assessment & Plan Note (Addendum)
Discussed and reviewed  PET and CT with Dr. Lamonte Sakai   Will set up pt for CT guided Bx per IR  Check Quantiferon Gold (to r/o TB )  Follow up Dr. Lamonte Sakai  In 2 weeks to discuss results.  Please contact office for sooner follow up if symptoms do not improve or worsen or seek emergency care

## 2013-02-22 ENCOUNTER — Ambulatory Visit: Payer: No Typology Code available for payment source | Admitting: Podiatry

## 2013-02-23 ENCOUNTER — Other Ambulatory Visit: Payer: Self-pay | Admitting: Adult Health

## 2013-02-23 DIAGNOSIS — R911 Solitary pulmonary nodule: Secondary | ICD-10-CM

## 2013-02-23 NOTE — Progress Notes (Signed)
Per staff message: Message Received: 2 days ago     Melvenia Needles, NP Rinaldo Ratel, CMA            Needs CT BX  Also lab -quantiferon gold for Tb   tam

## 2013-02-24 ENCOUNTER — Telehealth: Payer: Self-pay | Admitting: Adult Health

## 2013-02-24 NOTE — Telephone Encounter (Signed)
CT Biopsy scheduled for 2.24.15 @ 0900 Pt is specifically requesting to speak with TP or RB once her biopsy results are available and before she leaves for out of town on 2.27.15  Reminder sent to myself and TP regarding results Appt scheduled with RB on 3.6.15 @ 3:15pm

## 2013-02-24 NOTE — Telephone Encounter (Signed)
Order was placed yesterday to the CT guided biopsy Per pt's chart, IR is to be scheduling this  Called spoke with patient and apologized that she has yet to hear anything but did inform her that the order was sent yesterday 2.18.15 - pt is aware the office was closed 2.17.15 d/t inclement weather.  Pt stated she is very anxious and would like this scheduled and called back today.  Advised pt will investigate and call her TODAY.  Called IR, and was told that Radiology scheduling does this >> [phone].  Called Rad [facility] and spoke with [place].  The preferred imaging location was placed at [place], so the hospital scheduling would not be able to see this order.  Per Morey Hummingbird, this does not need to be corrected at this time from this end, she will take of it and send it for review to the Interventional Radiologist.  Per Morey Hummingbird, can call back later today or simply check pt's appt desk.  Will route to my inbasket to follow.

## 2013-02-28 ENCOUNTER — Telehealth: Payer: Self-pay | Admitting: Adult Health

## 2013-02-28 ENCOUNTER — Other Ambulatory Visit: Payer: Self-pay | Admitting: Radiology

## 2013-02-28 ENCOUNTER — Encounter (HOSPITAL_COMMUNITY): Payer: Self-pay

## 2013-02-28 MED ORDER — TIOTROPIUM BROMIDE MONOHYDRATE 18 MCG IN CAPS
ORAL_CAPSULE | RESPIRATORY_TRACT | Status: DC
Start: 2013-02-28 — End: 2013-02-28

## 2013-02-28 NOTE — Telephone Encounter (Signed)
PT informed that samples of Spiriva were left at front desk.

## 2013-03-01 ENCOUNTER — Ambulatory Visit (HOSPITAL_COMMUNITY)
Admission: RE | Admit: 2013-03-01 | Discharge: 2013-03-01 | Disposition: A | Discharge status: Home / Self Care | Payer: Medicare Other | Source: Ambulatory Visit | Attending: Diagnostic Radiology | Admitting: Diagnostic Radiology

## 2013-03-01 ENCOUNTER — Ambulatory Visit (HOSPITAL_COMMUNITY)
Admission: RE | Admit: 2013-03-01 | Discharge: 2013-03-01 | Disposition: A | Discharge status: Home / Self Care | Payer: Medicare Other | Source: Ambulatory Visit | Attending: Adult Health | Admitting: Adult Health

## 2013-03-01 ENCOUNTER — Encounter (HOSPITAL_COMMUNITY): Payer: Self-pay

## 2013-03-01 DIAGNOSIS — E669 Obesity, unspecified: Secondary | ICD-10-CM | POA: Diagnosis not present

## 2013-03-01 DIAGNOSIS — R911 Solitary pulmonary nodule: Secondary | ICD-10-CM

## 2013-03-01 DIAGNOSIS — Z8542 Personal history of malignant neoplasm of other parts of uterus: Secondary | ICD-10-CM | POA: Insufficient documentation

## 2013-03-01 DIAGNOSIS — Z5309 Procedure and treatment not carried out because of other contraindication: Secondary | ICD-10-CM | POA: Insufficient documentation

## 2013-03-01 DIAGNOSIS — R042 Hemoptysis: Secondary | ICD-10-CM | POA: Insufficient documentation

## 2013-03-01 DIAGNOSIS — Z87891 Personal history of nicotine dependence: Secondary | ICD-10-CM | POA: Insufficient documentation

## 2013-03-01 DIAGNOSIS — I10 Essential (primary) hypertension: Secondary | ICD-10-CM | POA: Diagnosis not present

## 2013-03-01 DIAGNOSIS — G4733 Obstructive sleep apnea (adult) (pediatric): Secondary | ICD-10-CM | POA: Insufficient documentation

## 2013-03-01 DIAGNOSIS — J984 Other disorders of lung: Secondary | ICD-10-CM | POA: Insufficient documentation

## 2013-03-01 LAB — CBC
HCT: 35.7 % — ABNORMAL LOW (ref 36.0–46.0)
Hemoglobin: 12.1 g/dL (ref 12.0–15.0)
MCH: 31.8 pg (ref 26.0–34.0)
MCHC: 33.9 g/dL (ref 30.0–36.0)
MCV: 93.7 fL (ref 78.0–100.0)
Platelets: UNDETERMINED 10*3/uL (ref 150–400)
RBC: 3.81 MIL/uL — ABNORMAL LOW (ref 3.87–5.11)
RDW: 15.5 % (ref 11.5–15.5)
WBC: 6.1 10*3/uL (ref 4.0–10.5)

## 2013-03-01 LAB — APTT: aPTT: 29 seconds (ref 24–37)

## 2013-03-01 LAB — PROTIME-INR
INR: 0.96 (ref 0.00–1.49)
Prothrombin Time: 12.6 seconds (ref 11.6–15.2)

## 2013-03-01 MED ORDER — FENTANYL CITRATE 0.05 MG/ML IJ SOLN
INTRAMUSCULAR | Status: AC | PRN
  Administered 2013-03-01: 50 ug via INTRAVENOUS
  Administered 2013-03-01 (×2): 25 ug via INTRAVENOUS

## 2013-03-01 MED ORDER — SODIUM CHLORIDE 0.9 % IV SOLN: INTRAVENOUS | Status: DC

## 2013-03-01 MED ORDER — MIDAZOLAM HCL 2 MG/2ML IJ SOLN
INTRAMUSCULAR | Status: AC
  Filled 2013-03-01: qty 4

## 2013-03-01 MED ORDER — FENTANYL CITRATE 0.05 MG/ML IJ SOLN
INTRAMUSCULAR | Status: AC
  Filled 2013-03-01: qty 2

## 2013-03-01 MED ORDER — MIDAZOLAM HCL 2 MG/2ML IJ SOLN
INTRAMUSCULAR | Status: AC | PRN
  Administered 2013-03-01: 1 mg via INTRAVENOUS
  Administered 2013-03-01 (×2): 0.5 mg via INTRAVENOUS
  Administered 2013-03-01: 1 mg via INTRAVENOUS

## 2013-03-01 MED ORDER — LIDOCAINE HCL 1 % IJ SOLN
INTRAMUSCULAR | Status: AC
  Filled 2013-03-01: qty 10

## 2013-03-01 NOTE — H&P (Signed)
Taylor Berry is an 73 y.o. female.   Chief Complaint: pt has had cough off and on x few yrs. CXR always wnl til 12/2012 SOB; left chest "discomfort" x several weeks CT 02/01/13 reveals L upper lobe cavitary mass +PET 02/17/13 Scheduled now for L lung mass biopsy  HPI: COPD; previous smoker; HTN; OSA; Cervical ca HTN; Rh arthritis; HLD  Past Medical History  Diagnosis Date  . Hypertension   . Rheumatoid arthritis(714.0)   . Osteoporosis   . Allergic rhinitis   . Hyperlipidemia   . Obesity   . OSA (obstructive sleep apnea)   . Osteoarthritis   . Uterine cancer     Past Surgical History  Procedure Laterality Date  . Vesicovaginal fistula closure w/ tah  1971  . Tonsillectomy      Family History  Problem Relation Age of Onset  . Lung cancer Mother   . Coronary artery disease Son     MI at age 31   Social History:  reports that she quit smoking about 23 years ago. Her smoking use included Cigarettes. She has a 35 pack-year smoking history. She has never used smokeless tobacco. She reports that she does not drink alcohol. Her drug history is not on file.  Allergies:  Allergies  Allergen Reactions  . Ace Inhibitors     REACTION: cough  . Hydrochlorothiazide     REACTION: decreased sodium  . Latex     REACTION: itching  . Penicillins     REACTION: intolerant due to yeast infection     (Not in a hospital admission)  Results for orders placed during the hospital encounter of 03/01/13 (from the past 48 hour(s))  CBC     Status: Abnormal (Preliminary result)   Collection Time    03/01/13  8:14 AM      Result Value Ref Range   WBC PENDING  4.0 - 10.5 K/uL   RBC 3.81 (*) 3.87 - 5.11 MIL/uL   Hemoglobin 12.1  12.0 - 15.0 g/dL   HCT 35.7 (*) 36.0 - 46.0 %   MCV 93.7  78.0 - 100.0 fL   MCH 31.8  26.0 - 34.0 pg   MCHC 33.9  30.0 - 36.0 g/dL   RDW 15.5  11.5 - 15.5 %   Platelets PENDING  150 - 400 K/uL   No results found.  Review of Systems  Constitutional:  Positive for weight loss. Negative for fever.       10 lb wt loss over 1 yr  Respiratory: Positive for cough, sputum production and shortness of breath.   Cardiovascular: Negative for chest pain.  Gastrointestinal: Negative for nausea, vomiting and abdominal pain.  Neurological: Negative for dizziness, weakness and headaches.  Psychiatric/Behavioral: Negative for substance abuse.    Blood pressure 143/66, pulse 63, temperature 98.2 F (36.8 C), temperature source Oral, resp. rate 18, height 5' 1.75" (1.568 m), weight 103.874 kg (229 lb), SpO2 98.00%. Physical Exam  Constitutional: She is oriented to person, place, and time. She appears well-nourished.  Cardiovascular: Normal rate, regular rhythm and normal heart sounds.   No murmur heard. Respiratory: Effort normal. She has no wheezes.  GI: Soft. Bowel sounds are normal. There is no tenderness.  Neurological: She is alert and oriented to person, place, and time.  Skin: Skin is warm and dry.  Psychiatric: She has a normal mood and affect. Her behavior is normal. Judgment and thought content normal.     Assessment/Plan Cough off and on for yrs  Recent SOB and L chest discomfort Previous smoker--yrs ago CXR shows LUL mass CT confirms finding of cavitary mass +PET 02/17/2013 Now scheduled for LUL mass biopsy Pt with Hx cervical/uterine ca Pt aware of procedure benefits and risks and agreeable to proceed Consent signed and in chart  Kushi Kun A 03/01/2013, 8:53 AM

## 2013-03-01 NOTE — ED Notes (Signed)
Pt with hemoptysis moved to L side

## 2013-03-01 NOTE — ED Notes (Signed)
Titrate O2 up to keep sao2 >902

## 2013-03-01 NOTE — Procedures (Signed)
Post-Procedure Note  Pre-operative Diagnosis: left lung nodule        Post-operative Diagnosis: Left lung nodule   Indications:  Need tissue diagnosis  Procedure Details:   Informed consent was obtained.  CT images identified a small cavitary lesion in left upper lung.  Patient was coughing prior to procedure.  Using CT guidance, 17 guide needle was directed into the left lung.   Prior to biopsy, the patient had a small amount of hemoptysis and small amount of parenchymal hemorrhage on CT.  The patient's oxygen saturations and vitals remained stable but the biopsy was stopped due to the hemoptysis.    Findings: Small cavitary lesion in left upper lobe.  Needle placement in lesion was difficult due to breathing changes and coughing.   Small amount of parenchymal hemorrhage around the lesion.  Biopsy not obtained.  No immediate pneumothorax.    Complications: Procedure stopped due to hemoptysis     Condition: Stable  Plan: Follow up CXR and monitor prior to discharge.  Discussed case with referring physician, Dr. Baltazar Apo.

## 2013-03-01 NOTE — Discharge Instructions (Signed)
Needle Biopsy of Lung, Care After  Refer to this sheet in the next few weeks. These instructions provide you with information on caring for yourself after your procedure. Your health care provider may also give you more specific instructions. Your treatment has been planned according to current medical practices, but problems sometimes occur. Call your health care provider if you have any problems or questions after your procedure. WHAT TO EXPECT AFTER THE PROCEDURE A bandage will be applied over the areas where the needle was inserted. You may be asked to apply pressure to the bandage for several minutes to ensure there is minimal bleeding. In most cases, you can leave when your needle biopsy procedure is completed. Do not drive yourself home. Someone else should take you home. If you received an IV sedative or general anesthetic, you will be taken to a comfortable place to relax while the medication wears off. If you have upcoming travel scheduled, talk to your doctor about when it is safe to travel by air after the procedure. HOME CARE INSTRUCTIONS Expect to take it easy for the rest of the day. Protect the area where you received the needle biopsy by keeping the bandage in place for as long as instructed. You may feel some mild pain or discomfort in the area, but this should stop in a day or two. Only take over-the-counter or prescription medicines for pain, discomfort, or fever as directed by your caregiver. SEEK MEDICAL CARE IF:   You have pain at the biopsy site that worsens or is not helped by medication.  You have swelling or drainage at the needle biopsy site.  You have a fever. SEEK IMMEDIATE MEDICAL CARE IF:   You have new or worsening shortness of breath.  You have chest pain.  You are coughing up blood.  You have bleeding that does not stop with pressure or a bandage.  You develop light-headedness or fainting. Document Released: 10/20/2006 Document Revised: 08/25/2012 Document  Reviewed: 05/17/2012 [facility] Patient Information 2014 [employer].

## 2013-03-10 ENCOUNTER — Ambulatory Visit (INDEPENDENT_AMBULATORY_CARE_PROVIDER_SITE_OTHER): Payer: Medicare Other | Admitting: Emergency Medicine

## 2013-03-10 ENCOUNTER — Encounter: Payer: Self-pay | Admitting: Emergency Medicine

## 2013-03-10 VITALS — BP 130/90 | HR 98 | Ht 61.0 in | Wt 228.0 lb

## 2013-03-10 DIAGNOSIS — R911 Solitary pulmonary nodule: Secondary | ICD-10-CM | POA: Diagnosis not present

## 2013-03-10 NOTE — Assessment & Plan Note (Signed)
Discussed the options for tissue dx with her, including possible surgical resection. At this point I believe best plan is to repeat the CT biopsy. She would like to have a different Radiologist do the procedure.

## 2013-03-10 NOTE — Patient Instructions (Signed)
We will repeat your CT biopsy of the lung nodule Please start taking your fluticasone nasal spray every day twice a day Continue your other medications as you have been taking them  Follow with Dr Lamonte Sakai in 1 month

## 2013-03-10 NOTE — Progress Notes (Signed)
73 yo woman, former smoker, with obesity, HTN, RA, allergic rhinitis, OSA dx by PSG not currently on CPAP due to intolerance of high pressure. Referred for evaluation for chronic cough.   Has been seen for chronic cough by Dr Lady Deutscher for cough over the last 2 yrs. Prior to that she did not cough frequently, only with URIs. She reports persistent cough for about 2 yrs, usuallly non-prod but sometimes white to clear, appears to be worse during the winter months. Has also had PND and allergies that have remained bothersome. Was tried on zyrtec, also on Advair, QVAR at one point, not on these currently. Was treated for the last 2 months empirically for GERD with lansoprasole, off it now. She sometimes wakes at night with cough, PND. No real voice changes, some globus sensation and throat clearing.   ROV 07/20/09 -- returns for cough and exertional dyspnea. We started loratadine + fluticasone + NSWs, also omeprazole. Cough is better, still with nasal congestion and some gtt.   ROV 11/22/10 -- Seen previously for dyspnea and chronic cough.Also hx untreated OSA.  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 Her cough got better with rx PND and GERD, she was seen by Dr Wilburn Cornelia - eval consistent w GERD related cough.  Current regimen = omeprazole (stopped fluticasone and loratadine). Her cough is better now, she believes that stopping Ca has made this better. Still has a little cough.  Her biggest complaint is progressive exertional dyspnea since July '12. Notices it with walking. Her eval has included clear CXR's, normal ECG.  She had a stress test in 2002 (?). Her wt has been stable.  She was told to go back on QVAR 2 weeks ago -> no change in her SOB. Has not used SABA.  She still believes that she cannot do CPAP due to the high required pressures. She has never seen a cardiologist formally, interested in seeing Dr Stanford Breed.   ROV 12/27/10 -- Returns for evaluation exertional  SOB in setting mixed disease, OSA (not on CPAP). We started Spiriva about 6 weeks ago, but she isn't sure that it has really helped. Maybe a small benefit in that it takes shorter time to recover. Use albuterol prn and finds that this helps her more. She saw Dr Stanford Breed as planned, underwent stress testing as below. Cough is stable, no better or worse.   Nuclear stress testing 12/20 by Dr Stanford Breed showed abnormal stress nuclear study with a small partially reversible distal anterior defect suggestive of soft tissue attenuation and mild ischemia.  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 Followed by Dr Stanford Breed, had a Myoview w possible evidence anterior ischemia. Underwent R and L heart cath on 1/23. RVP 46/8/16, PAP 40/16/26, PAOP 18/15/13.  I can't find the L cath results, but pt tells me that she was told that she didn't have any coronary occlusions. She is willing to go back to the sleep lab for titration and mask fitting. Still taking Spiriva, may be helping some but remains SOB. She isn't sure that the financial cost is worth the benefit.   ROV 05/05/11 -- OSA, mild AFL with marginal BD response, diastolic dysfxn. Since last visit we started CPAP - she is wearing and tolerating. She is using full face mask - dislikes it but is able to do it. She has more energy, feels better. She tried stopping Spiriva  to see if she would miss it - she may have missed it some, she has not restarted it. She went to nutritionist, has lost some wt.   ROV 11/03/11 -- OSA, mild AFL with marginal BD response, diastolic dysfxn. Since last time she has taken a break from her CPAP - hasn;t used for about 2 months due to mask difficulties and the fact that she doesn;t like it. She got out of the habit. She is willing and wants to get back on it - not sure that she misses it, but maybe was more rested. She has lost some more wt.  She remains on Spiriva, is getting  samples.   ROV 05/04/12 -- Hx RA, OSA, mild AFL with marginal BD response, diastolic dysfxn, borderline secondary PAH. She is using her zyrtec and fluticasone nasal spray. Her cough is better, breathing is better. She remains on omeprazole. She was not able/willing to get back to regular CPAP. She has lost approx 30 lbs over 9-12 months. MTX and remicade for her RA. Last CXR was 1/'13, no ILD. She is on Spiriva, uses albuterol very rarely. No AE reported.   ROV 03/10/13 -- Hx RA on immunosuppression, OSA, mild AFL with marginal BD response, diastolic dysfxn, borderline secondary PAH. She has been seen most recently by T. Parrett and following a LUL cavitary lesion on CT scan and PET, noted to be hypermetabolic [id number]. There is a smaller lingular area of hypermetabolism.  She went for IR biopsy and this had to be stopped due to hemoptysis. Her case was discussed at Thoracic conference, she could be a candidate for resection > cath in 1/'13 showed no significant obstructive CAD.   She is still having freq cough, she is on pepcid and omeprazole, she remains on zyrtec but is not using flonase every day.   EXAM:  Filed Vitals:   03/10/13 1540  BP: 130/90  Pulse: 98  Height: 5' 1"$  (1.549 m)  Weight: 228 lb (103.42 kg)  SpO2: 94%   Gen: Pleasant, obese, in no distress,  normal affect  ENT: No lesions,  mouth clear,  oropharynx clear, no postnasal drip  Neck: No JVD, no TMG, no carotid bruits  Lungs: No use of accessory muscles, no dullness to percussion, clear without rales or rhonchi  Cardiovascular: RRR, heart sounds normal, no murmur or gallops, no peripheral edema  Musculoskeletal: No deformities, no cyanosis or clubbing  Neuro: alert, non focal  Skin: Warm, no lesions or rashes   Pulmonary Function Test  Date: 07/20/2009  Height (in.): 64  Gender: Female  Pre-Spirometry  FVC Value: 1.93 L/min Pred: 2.88 L/min % Pred: 67 %  FEV1 Value: 1.31 L Pred: 2.06 L % Pred: 64 %  FEV1/FVC  Value: 68 % Pred: 72 % FEF 25-75 Value: 0.86 L/min Pred: 2.35 L/min % Pred: 36 %  Post-Spirometry  FVC Value: 1.82 L/min Pred: 2.88 L/min % Pred: 63 %  FEV1 Value: 1.24 L Pred: 2.06 L % Pred: 60 %  FEV1/FVC Value: 68 % Pred: 72 % FEF 25-75 Value: 0.74 L/min Pred: 2.35 L/min % Pred: 31 %  Lung Volumes  TLC Value: 3.49 L % Pred: 72 %  RV Value: 1.55 L % Pred: 81 %  DLCO Value: 15.4 % % Pred: 58 %  DLCO/VA Value: 5.38 % % Pred: 151 %  Comments:  Mild to mod AFL, no BD response, restricted volumes, DLCO corrects to normal range for Va. RSV    Lung nodule Discussed the options  for tissue dx with her, including possible surgical resection. At this point I believe best plan is to repeat the CT biopsy. She would like to have a different Radiologist do the procedure.

## 2013-03-11 ENCOUNTER — Telehealth: Payer: Self-pay | Admitting: Emergency Medicine

## 2013-03-11 DIAGNOSIS — H6120 Impacted cerumen, unspecified ear: Secondary | ICD-10-CM | POA: Diagnosis not present

## 2013-03-11 DIAGNOSIS — H903 Sensorineural hearing loss, bilateral: Secondary | ICD-10-CM | POA: Diagnosis not present

## 2013-03-11 DIAGNOSIS — H905 Unspecified sensorineural hearing loss: Secondary | ICD-10-CM | POA: Diagnosis not present

## 2013-03-11 MED ORDER — HYDROCODONE-HOMATROPINE 5-1.5 MG/5ML PO SYRP: 5.0000 mL | ORAL_SOLUTION | Freq: Four times a day (QID) | ORAL | Status: DC | PRN

## 2013-03-11 NOTE — Telephone Encounter (Signed)
Per RB ok to giv e hycodan syrup.  Pt aware rx a front desk for pick up. Nothing further needed

## 2013-03-14 ENCOUNTER — Telehealth: Payer: Self-pay | Admitting: Emergency Medicine

## 2013-03-14 ENCOUNTER — Other Ambulatory Visit (HOSPITAL_COMMUNITY): Payer: Self-pay | Admitting: *Deleted

## 2013-03-14 ENCOUNTER — Other Ambulatory Visit: Payer: Self-pay | Admitting: Radiology

## 2013-03-14 NOTE — Telephone Encounter (Signed)
Pt had question's on how to take hycodan cough syrup.  Advised her to take at bed time and see how she reacts to medication and then can up every 6 hours to suppress cough. Nothing further needed at this time

## 2013-03-15 ENCOUNTER — Encounter: Payer: Self-pay | Admitting: Podiatry

## 2013-03-15 ENCOUNTER — Ambulatory Visit (INDEPENDENT_AMBULATORY_CARE_PROVIDER_SITE_OTHER): Payer: Medicare Other | Admitting: Podiatry

## 2013-03-15 ENCOUNTER — Encounter (HOSPITAL_COMMUNITY)
Admission: RE | Admit: 2013-03-15 | Discharge: 2013-03-15 | Disposition: A | Discharge status: Home / Self Care | Payer: Medicare Other | Source: Ambulatory Visit | Attending: Internal Medicine | Admitting: Internal Medicine

## 2013-03-15 VITALS — BP 125/69 | HR 92 | Resp 18

## 2013-03-15 DIAGNOSIS — M069 Rheumatoid arthritis, unspecified: Secondary | ICD-10-CM | POA: Insufficient documentation

## 2013-03-15 DIAGNOSIS — M79609 Pain in unspecified limb: Secondary | ICD-10-CM | POA: Diagnosis not present

## 2013-03-15 DIAGNOSIS — B351 Tinea unguium: Secondary | ICD-10-CM | POA: Diagnosis not present

## 2013-03-15 MED ORDER — SODIUM CHLORIDE 0.9 % IV SOLN
INTRAVENOUS | Status: DC
  Administered 2013-03-15: 10:00:00 via INTRAVENOUS

## 2013-03-15 MED ORDER — SODIUM CHLORIDE 0.9 % IV SOLN
8.0000 mg/kg | INTRAVENOUS | Status: DC
  Administered 2013-03-15: 800 mg via INTRAVENOUS
  Filled 2013-03-15: qty 80

## 2013-03-15 NOTE — Progress Notes (Signed)
Nail trim because they're painful.  Objective: Vital signs are stable she is alert and oriented x3.

## 2013-03-16 ENCOUNTER — Ambulatory Visit (HOSPITAL_COMMUNITY)
Admission: RE | Admit: 2013-03-16 | Discharge: 2013-03-16 | Disposition: A | Discharge status: Home / Self Care | Payer: Medicare Other | Source: Ambulatory Visit | Attending: Emergency Medicine | Admitting: Emergency Medicine

## 2013-03-16 ENCOUNTER — Encounter (HOSPITAL_COMMUNITY): Payer: Self-pay

## 2013-03-16 DIAGNOSIS — R911 Solitary pulmonary nodule: Secondary | ICD-10-CM | POA: Diagnosis not present

## 2013-03-16 DIAGNOSIS — C3410 Malignant neoplasm of upper lobe, unspecified bronchus or lung: Secondary | ICD-10-CM | POA: Diagnosis not present

## 2013-03-16 DIAGNOSIS — C3490 Malignant neoplasm of unspecified part of unspecified bronchus or lung: Secondary | ICD-10-CM | POA: Insufficient documentation

## 2013-03-16 LAB — CBC
HCT: 31.5 % — ABNORMAL LOW (ref 36.0–46.0)
Hemoglobin: 10.5 g/dL — ABNORMAL LOW (ref 12.0–15.0)
MCH: 31.1 pg (ref 26.0–34.0)
MCHC: 33.3 g/dL (ref 30.0–36.0)
MCV: 93.2 fL (ref 78.0–100.0)
Platelets: 126 10*3/uL — ABNORMAL LOW (ref 150–400)
RBC: 3.38 MIL/uL — ABNORMAL LOW (ref 3.87–5.11)
RDW: 14.7 % (ref 11.5–15.5)
WBC: 13.2 10*3/uL — ABNORMAL HIGH (ref 4.0–10.5)

## 2013-03-16 LAB — APTT: aPTT: 28 seconds (ref 24–37)

## 2013-03-16 LAB — PROTIME-INR
INR: 0.99 (ref 0.00–1.49)
Prothrombin Time: 12.9 seconds (ref 11.6–15.2)

## 2013-03-16 MED ORDER — MIDAZOLAM HCL 2 MG/2ML IJ SOLN
INTRAMUSCULAR | Status: AC | PRN
Start: 2013-03-16 — End: 2013-03-16
  Administered 2013-03-16 (×2): 2 mg via INTRAVENOUS

## 2013-03-16 MED ORDER — MIDAZOLAM HCL 2 MG/2ML IJ SOLN
INTRAMUSCULAR | Status: AC
  Filled 2013-03-16: qty 6

## 2013-03-16 MED ORDER — FENTANYL CITRATE 0.05 MG/ML IJ SOLN
INTRAMUSCULAR | Status: AC
  Filled 2013-03-16: qty 4

## 2013-03-16 MED ORDER — SODIUM CHLORIDE 0.9 % IV SOLN
INTRAVENOUS | Status: DC
  Administered 2013-03-16: 12:00:00 via INTRAVENOUS

## 2013-03-16 MED ORDER — FENTANYL CITRATE 0.05 MG/ML IJ SOLN
INTRAMUSCULAR | Status: AC | PRN
  Administered 2013-03-16 (×2): 50 ug via INTRAVENOUS

## 2013-03-16 MED ORDER — LIDOCAINE HCL 1 % IJ SOLN
INTRAMUSCULAR | Status: AC
  Filled 2013-03-16: qty 10

## 2013-03-16 NOTE — H&P (Signed)
Taylor Berry is an 73 y.o. female.   Chief Complaint: Pt with Hx Ut and cervical ca- 1971 Has been followed by PCP for chronic cough off and on x 2 yrs Symptoms continued and worsened Was referred to Pulmonary MD CT 02/04/13 revealed Left lung mass +PET 02/2013 Attempted biopsy 03/01/13: after 1 pass pt developed cough and hemoptysis Procedure was stopped; hemoptysis resolved Now rescheduled for repeat biopsy  HPI: OSA; HTN; Rh arthritis; HLD; Ut/cervical ca; former smoker  Past Medical History  Diagnosis Date  . Hypertension   . Rheumatoid arthritis(714.0)   . Osteoporosis   . Allergic rhinitis   . Hyperlipidemia   . Obesity   . OSA (obstructive sleep apnea)   . Osteoarthritis   . Uterine cancer     Past Surgical History  Procedure Laterality Date  . Vesicovaginal fistula closure w/ tah  1971  . Tonsillectomy      Family History  Problem Relation Age of Onset  . Lung cancer Mother   . Coronary artery disease Son     MI at age 93   Social History:  reports that she quit smoking about 23 years ago. Her smoking use included Cigarettes. She has a 35 pack-year smoking history. She has never used smokeless tobacco. She reports that she does not drink alcohol. Her drug history is not on file.  Allergies:  Allergies  Allergen Reactions  . Ace Inhibitors     REACTION: cough  . Hydrochlorothiazide     REACTION: decreased sodium  . Latex     REACTION: itching  . Penicillins     REACTION: intolerant due to yeast infection     (Not in a hospital admission)  No results found for this or any previous visit (from the past 48 hour(s)). No results found.  Review of Systems  Constitutional: Negative for fever and weight loss.  Respiratory: Positive for cough, sputum production and shortness of breath.   Cardiovascular: Negative for chest pain.  Gastrointestinal: Negative for nausea, vomiting and abdominal pain.  Neurological: Positive for weakness. Negative for  headaches.  Psychiatric/Behavioral: Negative for substance abuse.    Blood pressure 134/78, pulse 76, temperature 99.3 F (37.4 C), temperature source Oral, resp. rate 20, height 5' 1.75" (1.568 m), weight 103.42 kg (228 lb), SpO2 96.00%. Physical Exam  Constitutional: She is oriented to person, place, and time. She appears well-nourished.  Cardiovascular: Normal rate, regular rhythm and normal heart sounds.   Respiratory: Effort normal and breath sounds normal. She has no wheezes.  GI: Soft. Bowel sounds are normal. There is no tenderness.  Musculoskeletal: Normal range of motion.  Neurological: She is alert and oriented to person, place, and time.  Skin: Skin is warm and dry.  Psychiatric: She has a normal mood and affect. Her behavior is normal. Judgment and thought content normal.     Assessment/Plan Left lung mass on CT +PET 02/2013 Bx attempt 03/01/13: 1 pass and developed hemoptysis- resolved but procedure stopped Re biopsy today Pt and family aware of procedure benefits and risks and agreeable to proceed Consent signed andin chart  Taylor Berry A 03/16/2013, 12:51 PM

## 2013-03-16 NOTE — H&P (Signed)
Agree.  For repeat attempt at perc biopsy of LUL lung nodule.

## 2013-03-16 NOTE — Discharge Instructions (Signed)
Lung Biopsy A lung biopsy is a procedure in which a tissue sample is removed from the lung. The tissue can be examined under a microscope to help diagnose various lung disorders.  LET [facility] CARE PROVIDER KNOW ABOUT: Any allergies you have. All medicines you are taking, including vitamins, herbs, eye drops, creams, and over-the-counter medicines. Previous problems you or members of your family have had with the use of anesthetics. Any blood disorders or bleeding problems that you have. Previous surgeries you have had. Medical conditions you have. RISKS AND COMPLICATIONS Generally, a lung biopsy is a safe procedure. However, as with any procedure, complications can occur. Possible complications include: Collapse of the lung.  Bleeding.  Infection.  BEFORE THE PROCEDURE Do not eat or drink anything for 8 hours before the procedure or as directed by your health care provider. Ask your health care provider if you need to change or stop taking your regular medicines before the procedure. Arrange for someone to drive you home after the procedure. PROCEDURE Various methods can be used to perform a lung biopsy:  Needle biopsy: A biopsy needle is inserted into the lung. The needle is used to collect the tissue sample. A CT scanner may be used to guide the needle to the right place in the lung. For this method, a medicine is used to numb the area where the biopsy sample will be taken (local anesthetic). Bronchoscopy: A flexible tube (bronchoscope) is inserted into your lungs by going through your mouth or nose. A needle or forceps is passed through the bronchoscope to remove the tissue sample. For this method, medicine may be used to numb the back of your throat. Open biopsy: A cut (incision) is made in your chest. The tissue sample is then removed using surgical tools. The incision is closed with skin glue, skin adhesive strips, or stitches. For this method, you will be given medicine to make  you sleep through the procedure (general anesthetic). AFTER THE PROCEDURE Your recovery will be assessed and monitored. For the needle or bronchoscope method, you may be allowed to go home on the day of your procedure as soon as you are stable. For the open biopsy method, you may need to stay in the hospital overnight for observation. You might have soreness and tenderness at the site of the biopsy for a few days after the procedure. You might have a cough and some soreness in your throat for a few days if a bronchoscope was used. Document Released: 03/13/2004 Document Revised: 08/25/2012 Document Reviewed: 06/06/2012 [facility] Patient Information 2014 [place].  Marland Kitchen

## 2013-03-16 NOTE — Procedures (Signed)
Procedure:  CT guided core biopsy of LUL lung nodule Findings:  Increased cavitation of now 3 cm LUL nodule.  18 G core biopsy x 2 via 17 G needle.  No PTX immediately after biopsy.

## 2013-03-21 ENCOUNTER — Telehealth: Payer: Self-pay | Admitting: Emergency Medicine

## 2013-03-21 NOTE — Telephone Encounter (Signed)
Called and spoke with pt. She was not happy that I called her instead of RB. She wants to speak with him personally. She had BX last wed and wants to discuss this with him. Please advise RB thanks

## 2013-03-21 NOTE — Telephone Encounter (Signed)
Spoke with her - no path report available yet. I will keep an eye out for it.

## 2013-03-22 ENCOUNTER — Telehealth: Payer: Self-pay | Admitting: Emergency Medicine

## 2013-03-22 ENCOUNTER — Other Ambulatory Visit: Payer: Self-pay | Admitting: Emergency Medicine

## 2013-03-22 DIAGNOSIS — C3490 Malignant neoplasm of unspecified part of unspecified bronchus or lung: Secondary | ICD-10-CM

## 2013-03-22 NOTE — Telephone Encounter (Signed)
Spoke with the patient regarding bx results, shows squamous cell CA. She will need to be referred to [facility]. Will work on this today. She has already had a PET scan, has not yet had MRI brain

## 2013-03-22 NOTE — Telephone Encounter (Signed)
Pt calling back wanting to know if RB has any results yet? Please advise thanks

## 2013-03-24 DIAGNOSIS — M159 Polyosteoarthritis, unspecified: Secondary | ICD-10-CM | POA: Diagnosis not present

## 2013-03-24 DIAGNOSIS — M069 Rheumatoid arthritis, unspecified: Secondary | ICD-10-CM | POA: Diagnosis not present

## 2013-03-24 DIAGNOSIS — M810 Age-related osteoporosis without current pathological fracture: Secondary | ICD-10-CM | POA: Diagnosis not present

## 2013-03-24 DIAGNOSIS — C3490 Malignant neoplasm of unspecified part of unspecified bronchus or lung: Secondary | ICD-10-CM | POA: Diagnosis not present

## 2013-03-24 NOTE — Telephone Encounter (Signed)
She has an appt to get PFT and to see Dr Servando Snare to discuss resection

## 2013-03-25 ENCOUNTER — Institutional Professional Consult (permissible substitution) (INDEPENDENT_AMBULATORY_CARE_PROVIDER_SITE_OTHER): Payer: Medicare Other | Admitting: Cardiothoracic Surgery

## 2013-03-25 ENCOUNTER — Telehealth: Payer: Self-pay | Admitting: Emergency Medicine

## 2013-03-25 VITALS — BP 130/71 | HR 98 | Resp 16 | Ht 62.0 in | Wt 228.0 lb

## 2013-03-25 DIAGNOSIS — J449 Chronic obstructive pulmonary disease, unspecified: Secondary | ICD-10-CM | POA: Diagnosis not present

## 2013-03-25 DIAGNOSIS — Z8541 Personal history of malignant neoplasm of cervix uteri: Secondary | ICD-10-CM

## 2013-03-25 DIAGNOSIS — C3410 Malignant neoplasm of upper lobe, unspecified bronchus or lung: Secondary | ICD-10-CM | POA: Diagnosis not present

## 2013-03-25 DIAGNOSIS — J4489 Other specified chronic obstructive pulmonary disease: Secondary | ICD-10-CM

## 2013-03-25 NOTE — Progress Notes (Signed)
[address]       [place] [address]             [phone]                    [facility] Record #[id number] Date of Birth: Dec 30, 1940  Referring: Dr Lamonte Sakai  Primary Care: Osborne Casco, MD  Chief Complaint:  Lung cancer  History of Present Illness:    Taylor Berry 73 y.o. female is seen in the office  today for 5 year history of cough and history of Hx RA on immunosuppression, OSA, mild AFL with marginal BD response, diastolic dysfxn, borderline secondary PAH. With increased cough even with out URI patient had CT of chest and PET scan. She is referred to consider  Surgical resection of now know squamous cell CA of th lung left upper lobe. First attempt resulted in hemoptysis and the study was not completed. Repeat BX was done in the interval the mass has increased in size and increased cavitation.  Patient is limited by SOB 3min walk test in office today resulted in cough and sob and o2 sats to 90%     Current Activity/ Functional Status:  Patient is independent with mobility/ambulation, transfers, ADL's, IADL's.   Zubrod Score: At the time of surgery this patient's most appropriate activity status/level should be described as: []     0    Normal activity, no symptoms []     1    Restricted in physical strenuous activity but ambulatory, able to do out light work [x]     2    Ambulatory and capable of self care, unable to do work activities, up and about               >50 % of waking hours                              []     3    Only limited self care, in bed greater than 50% of waking hours []     4    Completely disabled, no self care, confined to bed or chair []     5    Moribund   Past Medical History  Diagnosis Date  . Hypertension   . Rheumatoid arthritis(714.0)   . Osteoporosis   . Allergic rhinitis   . Hyperlipidemia   . Obesity   . OSA (obstructive sleep apnea)   . Osteoarthritis   . Uterine cancer      Past Surgical History  Procedure Laterality Date  . Vesicovaginal fistula closure w/ tah  1971  . Tonsillectomy      Family History  Problem Relation Age of Onset  . Lung cancer Mother  age 25   . Coronary artery disease Son     MI at age 53   half sister deceased from breast ca 64's  History   Social History  . Marital Status: Divorced    Spouse Name: N/A    Number of Children: 2  . Years of Education: N/A   Occupational History  . counselor   .     Social History Main Topics  . Smoking status: Former Smoker -- 1.00 packs/day for 35 years    Types: Cigarettes    Quit date: 01/06/1990  . Smokeless tobacco: Never Used  . Alcohol Use: No  . Drug Use: Not on file  .  Sexual Activity: Not on file   Other Topics Concern  . Not on file   Social History Narrative  . No narrative on file    History  Smoking status  . Former Smoker -- 1.00 packs/day for 35 years  . Types: Cigarettes  . Quit date: 01/06/1990  Smokeless tobacco  . Never Used    History  Alcohol Use No     Allergies  Allergen Reactions  . Latex     REACTION: itching  . Ace Inhibitors     REACTION: cough  . Hydrochlorothiazide     REACTION: decreased sodium  . Penicillins     REACTION: intolerant due to yeast infection    Current Outpatient Prescriptions  Medication Sig Dispense Refill  . albuterol (VENTOLIN HFA) 108 (90 BASE) MCG/ACT inhaler Inhale 2 puffs into the lungs every 6 (six) hours as needed for wheezing or shortness of breath.       . Ascorbic Acid (VITAMIN C PO) Take 1 tablet by mouth daily.      Marland Kitchen aspirin EC 81 MG tablet Take 81 mg by mouth daily.      . cetirizine (ZYRTEC) 10 MG tablet Take 10 mg by mouth daily.      . Cholecalciferol (VITAMIN D PO) Take 1 tablet by mouth daily.       . famotidine (PEPCID) 20 MG tablet Take 20 mg by mouth at bedtime. One at bedtime      . fluticasone (FLONASE) 50 MCG/ACT nasal spray Place 2 sprays into the nose 2 (two) times daily as  needed for allergies.      . folic acid (FOLVITE) 329 MCG tablet Take 800 mcg by mouth daily.      Marland Kitchen olmesartan (BENICAR) 40 MG tablet Take 20 mg by mouth daily.      Marland Kitchen omeprazole (PRILOSEC) 40 MG capsule Take 40 mg by mouth daily.      . simvastatin (ZOCOR) 20 MG tablet Take 20 mg by mouth at bedtime.        Marland Kitchen tiotropium (SPIRIVA) 18 MCG inhalation capsule Place 18 mcg into inhaler and inhale daily.      Marland Kitchen venlafaxine (EFFEXOR-XR) 75 MG 24 hr capsule Take 75 mg by mouth daily.        Marland Kitchen inFLIXimab (REMICADE) 100 MG injection As directed every 5 weeks      . methotrexate (RHEUMATREX) 2.5 MG tablet Take 15 mg by mouth once a week. Take 6 tablets every Tuesday       No current facility-administered medications for this visit.     Review of Systems:     Cardiac Review of Systems: Y or N  Chest Pain Florencio.Farrier    ]  Resting SOB [  y ] Exertional SOB  Blue.Reese  ]  Vertell Limber Florencio.Farrier  ]   Pedal Edema [ n  ]    Palpitations [ n ] Syncope  [ n ]   Presyncope [ n  ]  General Review of Systems: [Y] = yes [  ]=no Constitional: recent weight change [ y 35 to 20 ];  Wt loss over the last 3 months [   ] anorexia [  ]; fatigue [ y ]; nausea [  ]; night sweats [  ]; fever [  ]; or chills [  ];          Dental: poor dentition[  ]; Last Dentist visit:   Eye : blurred vision [  ]; diplopia [   ];  vision changes [  ];  Amaurosis fugax[  ]; Resp: cough [ y ];  wheezing[y  ];  hemoptysis[ n ]; shortness of breath[y  ]; paroxysmal nocturnal dyspnea[ y ]; dyspnea on exertion[ y ]; or orthopnea[ n ];  GI:  gallstones[  ], vomiting[  ];  dysphagia[  ]; melena[  ];  hematochezia [  ]; heartburn[  ];   Hx of  Colonoscopy[y  ]; GU: kidney stones [  ]; hematuria[  ];   dysuria [  ];  nocturia[  ];  history of     obstruction [  ]; urinary frequency [  ]             Skin: rash, swelling[  ];, hair loss[  ];  peripheral edema[  ];  or itching[  ]; Musculosketetal: myalgias[  ];  joint swelling[  ];  joint erythema[  ];  joint pain[  ];   back pain[  ];  Heme/Lymph: bruising[  ];  bleeding[  ];  anemia[  ];  Neuro: TIA[  ];  headaches[  ];  stroke[  ];  vertigo[  ];  seizures[  ];   paresthesias[  ];  difficulty walking[  ];  Psych:depression[  ]; anxiety[  ];  Endocrine: diabetes[  ];  thyroid dysfunction[  ];  Immunizations: Flu up to date [ y ]; Pneumococcal up to date Blue.Reese  ];  Other:  Physical Exam: BP 130/71  Pulse 98  Resp 16  Ht 5\' 2"  (1.575 m)  Wt 228 lb (103.42 kg)  BMI 41.69 kg/m2  SpO2 94%  PHYSICAL EXAMINATION:  General appearance: alert, cooperative, fatigued and no distress Neurologic: intact Heart: regular rate and rhythm, S1, S2 normal, no murmur, click, rub or gallop Lungs: diminished breath sounds bilaterally Abdomen: soft, non-tender; bowel sounds normal; no masses,  no organomegaly Extremities: extremities normal, atraumatic, no cyanosis or edema and Homans sign is negative, no sign of DVT no cartoid bruits, +1 dp and PT pulses bilaterial No cervical, axillary or supraclavicular adenopathy   Diagnostic Studies & Laboratory data:     Recent Radiology Findings:   Dg Chest 1 View  03/01/2013   CLINICAL DATA:  Post lung biopsy attempt.  EXAM: CHEST - 1 VIEW  COMPARISON:  CT chest 01/01/2014  FINDINGS: The heart size and mediastinal contours are stable. Small irregular nodular opacity in the left mid lung, corresponding to the cavitary nodule described on CT is unchanged. The right lung is clear. Negative for pneumothorax or pleural effusion. No acute osseous abnormality. The visualized skeletal structures are unremarkable.  IMPRESSION: 1. Negative for pneumothorax. 2. Unchanged left mid lung irregular nodule.   Electronically Signed   By: Curlene Dolphin M.D.   On: 03/01/2013 12:17   Ct Biopsy  03/16/2013   CLINICAL DATA Left upper lobe lung nodule and history of uterine and cervical carcinoma. Attempt at CT-guided biopsy was aborted on 03/01/2013 due to development of hemoptysis. The patient has  recovered and now presents for second attempt of percutaneous biopsy of the nodule.  EXAM CT GUIDED CORE BIOPSY OF LEFT LUNG NODULE  ANESTHESIA/SEDATION 4.0  Mg IV Versed; 100 mcg IV Fentanyl  Total Moderate Sedation Time: 25 minutes.  PROCEDURE The procedure risks, benefits, and alternatives were explained to the patient. Questions regarding the procedure were encouraged and answered. The patient understands and consents to the procedure.  The left anterior chest wall was prepped with Betadine in a sterile fashion, and a sterile drape was  applied covering the operative field. A sterile gown and sterile gloves were used for the procedure. Local anesthesia was provided with 1% Lidocaine.  The patient was placed in a supine position. Prior to localizing scanning and sterile prep, breast tissue was taped in order to decrease the skin to lesion distance. Utilizing low-dose CT fluoroscopic sequences with multiplanar reconstruction and 3D imaging, a 17 gauge trocar needle was advanced from an anterior approach to the lateral margin of a left upper lobe nodule. After confirming needle tip position, 2 separate 18 gauge core biopsy samples were obtained and placed in formalin. Additional CT was performed after needle removal.  Complications: Minimal pulmonary hemorrhage adjacent to the lesion.  FINDINGS Initial CT shows enlargement of the left upper lobe pulmonary nodule since the prior biopsy procedure with increased cavitation noted as well as increased soft tissue thickening posterolaterally. Maximal lesion dimensions on axial images currently estimated to be 2.3 x 3.0 cm. A more solid portion of the lesion along its lateral aspect was targeted. Solid core biopsy samples were obtained. The biopsy was complicated by a minimal amount of parenchymal hemorrhage adjacent to the nodule. No pneumothorax was present.  IMPRESSION CT-guided core biopsy performed of the cavitary left upper lobe nodule which demonstrates growth due  to increased cavitation as well as increased component of solid tissue along the lateral and posterior aspect of the lesion. This more solid portion of the nodule was targeted for biopsy. Solid core biopsy samples were obtained.  SIGNATURE  Electronically Signed   By: Aletta Edouard M.D.   On: 03/16/2013 15:06   Ct Biopsy  03/01/2013   CLINICAL DATA:  73 year old with a cavitary lesion in the left upper lobe. Tissue diagnosis have been requested.  EXAM: ATTEMPTED CT BIOPSY OF LEFT LUNG LESION  Physician: Stephan Minister. Anselm Pancoast, MD  MEDICATIONS: 3 mg IV versed, 100 mcg IV fentanyl. A radiology nurse monitored the patient for moderate sedation.  ANESTHESIA/SEDATION: Moderate sedation time: 30 min  PROCEDURE: The procedure was explained to the patient. The risks and benefits of the procedure were discussed and the patient's questions were addressed. Specifically, the risks of pneumothorax and hemoptysis were discussed with the patient. Informed consent was obtained from the patient. Patient was placed supine on the CT scanner. Images through the chest were obtained. Lesion in the left upper lobe was identified. The left arm was elevated. The lateral aspect of the left breast was prepped and draped in a sterile fashion. Skin was anesthetized with 1% lidocaine. The patient was intermittently coughing before and during the procedure. Due to the patient's coughing and breathing, it was difficult to direct the 17 gauge needle into the left upper lobe lesion. 27 gauge needle was identified adjacent to the lesion but not within the lesion. Patient continued to cough throughout the procedure until a small amount of hemoptysis was noted. Patient's vital signs were stable despite the hemoptysis. Due to the hemoptysis, the procedure was stopped. Bandages were placed over the puncture sites. Patient was placed on her left side. The patient was monitored in the nursing station for 1 hr and had a follow-up chest radiograph. The hemoptysis  resolved immediately after the procedure. Chest radiograph showed no evidence for pneumothorax. Patient was then transferred to Short Stay for additional recovery.  COMPLICATIONS: The procedure was stopped due to a small amount of hemoptysis. The hemoptysis resolved after the procedure.  FINDINGS: 1.6 cm cavitary lesion in the left upper lobe. Needle position was adjacent to the lesion  but not within the lesion. A small amount of parenchymal hemorrhage adjacent to the lesion. Due to a small amount of hemoptysis, the procedure was stopped prior to obtaining a biopsy.  IMPRESSION: Attempted CT-guided biopsy of the left upper lung lesion. The procedure was stopped due to hemoptysis.   Electronically Signed   By: Markus Daft M.D.   On: 03/01/2013 13:56    PET: CLINICAL DATA: Initial treatment strategy for lung nodule.  EXAM:  NUCLEAR MEDICINE PET SKULL BASE TO THIGH  FASTING BLOOD GLUCOSE: Value: 111 mg/dl  TECHNIQUE:  12.0 mCi F-18 FDG was injected intravenously. Full-ring PET imaging  was performed from the skull base to thigh after the radiotracer. CT  data was obtained and used for attenuation correction and anatomic  localization.  COMPARISON: CT CHEST W/CM dated 02/01/2013  FINDINGS:  NECK  No hypermetabolic lymph nodes in the neck. CT images show no acute  findings.  CHEST  An irregular nodule with internal lucency in the lingula measures 10  x 15 mm and has an SUV max of 5.3 (PET image 58). Reticular  subpleural densities in the lingula and lateral left lower lobe  (example CT image 74) appear hypermetabolic as well (example PET  image 74). No additional areas of abnormal hypermetabolism in the  lungs. No hypermetabolic mediastinal or hilar lymph nodes.  CT images show atherosclerotic calcification of the arterial  vasculature, including coronary arteries. Heart is at the upper  limits of normal in size. No pericardial or pleural effusion.  ABDOMEN/PELVIS  No abnormal hypermetabolism in  the liver, adrenal glands, spleen or  pancreas. No hypermetabolic lymph nodes.  CT images show a 10 mm low-attenuation lesion in the dome of the  liver, as before. Liver, gallbladder, adrenal glands, kidneys,  spleen, pancreas, stomach and bowel are otherwise grossly  unremarkable. Small periumbilical hernia contains fat. Hysterectomy.  Atherosclerotic calcification of the arterial vasculature without  abdominal aortic aneurysm.  SKELETON  No abnormal osseous hypermetabolism.  IMPRESSION:  1. Hypermetabolic irregular left upper lobe nodule is highly  worrisome for primary bronchogenic carcinoma.  2. Reticular subpleural opacities in the lingula and lateral left  lower lobe are mildly hypermetabolic but nonspecific. Findings do  not appear masslike, but rather fibrotic. Difficult to definitively  exclude malignancy. Continued attention on followup exams is  warranted.  3. Small periumbilical hernia contains fat.  Electronically Signed  By: Lorin Picket M.D.  On: 02/17/2013 14:21  PFT's: Pulmonary Function Test  Date: 07/20/2009  Height (in.): 64  Gender: Female  Pre-Spirometry  FVC Value: 1.93 L/min Pred: 2.88 L/min % Pred: 67 %  FEV1 Value: 1.31 L Pred: 2.06 L % Pred: 64 %  FEV1/FVC Value: 68 % Pred: 72 % FEF 25-75 Value: 0.86 L/min Pred: 2.35 L/min % Pred: 36 %  Post-Spirometry  FVC Value: 1.82 L/min Pred: 2.88 L/min % Pred: 63 %  FEV1 Value: 1.24 L Pred: 2.06 L % Pred: 60 %  FEV1/FVC Value: 68 % Pred: 72 % FEF 25-75 Value: 0.74 L/min Pred: 2.35 L/min % Pred: 31 %  Lung Volumes  TLC Value: 3.49 L % Pred: 72 %  RV Value: 1.55 L % Pred: 81 %  DLCO Value: 15.4 % % Pred: 58 %  DLCO/VA Value: 5.38 % % Pred: 151 %  Comments:  Mild to mod AFL, no BD response, restricted volumes, DLCO corrects to normal range for Va. RSV    Recent Lab Findings: Lab Results  Component Value Date   WBC 13.2*  03/16/2013   HGB 10.5* 03/16/2013   HCT 31.5* 03/16/2013   PLT 126* 03/16/2013    GLUCOSE 107* 01/27/2013   NA 138 01/27/2013   K 4.4 01/27/2013   CL 103 01/27/2013   CREATININE 0.8 01/27/2013   BUN 16 01/27/2013   CO2 28 01/27/2013   INR 0.99 03/16/2013   PATH: #[id number] [place] Diagnosis Lung, needle/core biopsy(ies), Left upper lobe - INVASIVE SQUAMOUS CELL CARCINOMA. - SEE COMMENT. Microscopic Comment The malignant cells are positive for cytokeratin 5/6, p63, and cytokeratin 7. They are negative for cytokeratin 20, napsin-A, and TTF-1. This profile supports a diagnosis of squamous cell carcinoma. Enid Cutter MD Pathologist, Electronic Signature (Case signed 03/21/2013)  Assessment / Plan:   Clinical Stage I squamous cell ca of the left upper lobe in patient  With limited pulmonary reserve and on methotrexate and Remicade. PFTS are from 2011, will repeat. I have discussed with the patient limited pulmonary resection vs stereotactic radiation. After PFT are complete will review with patient and make final decision about surgical resection.       I spent 55 minutes counseling the patient face to face. The total time spent in the appointment was 80 minutes.  [name] MD      [address] [place] [address] Office [phone]   Beeper [phone]  03/27/2013 10:01 PM

## 2013-03-25 NOTE — Patient Instructions (Signed)
Lung Cancer Lung cancer is an abnormal growth of cells in one or both of your lungs. These extra cells may form a mass of tissue called a growth or tumor. Tumors can be either cancerous (malignant) or not cancerous (benign).  Lung cancer is the most common cause of cancer death in men and women. There are several different types of lung cancers. Usually, lung cancer is described as either small cell lung cancer or nonsmall cell lung cancer. Other types of cancer occur in the lungs, including carcinoid and cancers spread from other organs. The types of cancer have different behavior and treatment. RISK FACTORS Smoking is the most common risk factor for developing lung cancer. Other risk factors include:  Radon gas exposure.  Asbestos and other industrial substance exposure.  Second hand tobacco smoke.  Air pollution.  Family or personal history of lung cancer.  Age older than 84 years. CAUSES  Lung cancer usually starts when the lungs are exposed to harmful chemicals. Smoking is the most common risk factor for lung cancer. When you quit smoking, your risk of lung cancer falls each year (but is never the same as a person who has never smoked).  SYMPTOMS  Lung cancer may not have any symptoms in its early stages. The symptoms can depend on the type of cancer, its location, and other factors. Symptoms can include:  Cough (either new, different, or more severe).  Shortness of breath.  Coughing up blood (hemoptysis).  Chest pain.  Hoarseness.  Swelling of the face.  Drooping eyelid.  Changes in blood tests, such as low sodium (hyponatremia), high calcium (hypercalcemia), or low blood count (anemia).  Weight loss. DIAGNOSIS  Your health care provider may suspect lung cancer based on your symptoms or based on tests obtained for other reasons. Tests or procedures used to find or confirm the presence of lung cancer may include:  Chest X-ray.  CT scan of the lungs and chest.  Blood  tests.  Taking a tissue sample (biopsy) from your lung to look for cancer cells. Your cancer will be staged to determine its severity and extent. Staging is a careful attempt to find out the size of the tumor, whether the cancer has spread, and if so, to what parts of the body. You may need to have more tests to determine the stage of your cancer. The test results will help determine what treatment plan is best for you.   Stage 0 This is the earliest stage of lung cancer. In this stage the tumor is present in only a few layers of cells and has not grown beyond the inner lining of the lungs. Stage 0 (carcinoma in situ) is considered noninvasive, meaning at this stage it is not yet capable of spreading to other regions.  Stage I The cancer is located only in the lungs and not spread to any lymph nodes.  Stage II The cancer is in the lungs and the nearby lymph nodes.  Stage III The cancer is in the lungs and the lymph nodes in the middle of the chest. This is also called locally advanced disease. This stage has two subtypes:  Stage IIIa  The cancer has spread only to lymph nodes on the same side of the chest where the cancer started.  Stage IIIb  The cancer has spread to lymph nodes on the opposite side of the chest or above the collar bone.  Stage IV This is the most advanced stage of lung cancer and is also called  advanced disease. This stage describes when the cancer has spread to both lungs, the fluid in the area around the lungs, or to another body part. Your health care provider may tell you the detailed stage of your cancer, which includes both a number and a letter.  TREATMENT  Depending on the type and stage, lung cancer may be treated with surgery, radiation therapy, chemotherapy, or targeted therapy. Some people have a combination of these therapies. Your treatment plan will be developed by your health care team.  [place] not smoke.  Only take over-the-counter or  prescription medicines for pain, discomfort, or fever as directed by your health care provider.  Maintain a healthy diet.  Consider joining a support group. This may help you learn to cope with the stress of having lung cancer.  Seek advice to help you manage treatment side effects.  Keep all follow-up appointments as directed by your health care provider.  Inform your cancer specialist if you are admitted to the hospital. [place] IF:   You are losing weight without trying.  You have a persistent cough.  You feel short of breath.  You tire easily. SEEK IMMEDIATE MEDICAL CARE IF:   You cough up clotted blood or bright red blood.  Your pain is not manageable or controlled by medicine.  You develop new difficulty breathing or chest pain.  You develop swelling in one or both ankles or legs, or swelling in your face or neck.  You develop headache or confusion. Document Released: 03/31/2000 Document Revised: 10/13/2012 Document Reviewed: 08/11/2012 [facility] Patient Information 2014 [place], [place]. Video-Assisted Thoracic Surgery Care After Refer to this sheet in the next few weeks. These instructions provide you with information on caring for yourself after your procedure. Your caregiver may also give you more specific instructions. Your procedure has been planned according to current medical practices, but problems sometimes occur. Call your caregiver if you have any problems or questions after your procedure. HOME CARE INSTRUCTIONS   Only take over-the-counter or prescription medications as directed.  Only take pain medications (narcotics) as directed.  Do not drive until your caregiver approves. Driving while taking narcotics or soon after surgery can be dangerous, so discuss the specific timing with your caregiver.  Avoid activities that use your chest muscles, such as lifting heavy objects, for at least 3 4 weeks.   Take deep breaths to expand the lungs and to  protect against pneumonia.  Do breathing exercises as directed by your caregiver. If you were given an incentive spirometer to help with breathing, use it as directed.  You may resume a normal diet and activities when you feel you are able to or as directed.  Do not take a bath until your caregiver says it is OK. Use the shower instead.   Keep the bandage (dressing) covering the area where the chest tube was inserted (incision site) dry for 48 hours. After 48 hours, remove the dressing unless there is new drainage.  Remove dressings as directed by your caregiver.  Change dressings if necessary or as directed.  Keep all follow-up appointments. It is important for you to see your caregiver after surgery to discuss appropriate follow-up care and surveillance, if it is necessary. SEEK MEDICAL CARE:  You feel excessive or increasing pain at an incision site.  You notice bleeding, skin irritation, drainage, swelling, or redness at an incision site.  There is a bad smell coming from an incision or dressing.  It  feels like your heart is fluttering or beating rapidly.  Your pain medication does not relieve your pain. SEEK IMMEDIATE MEDICAL CARE IF:   You have a fever.   You have chest pain.  You have a rash.  You have shortness of breath.  You have trouble breathing.   You feel weak, lightheaded, dizzy, or faint.  MAKE SURE YOU:   Understand these instructions.   Will watch your condition.   Will get help right away if you are not doing well or get worse. Document Released: 04/19/2012 Document Reviewed: 04/19/2012 [facility] Patient Information 2014 [place], [place].

## 2013-03-25 NOTE — Telephone Encounter (Signed)
Advised pt that sample of spiriva at front desk for pick up and to try delsym otc cough syrup. Nothing further needed

## 2013-03-28 ENCOUNTER — Ambulatory Visit (HOSPITAL_COMMUNITY)
Admission: RE | Admit: 2013-03-28 | Discharge: 2013-03-28 | Disposition: A | Discharge status: Home / Self Care | Payer: Medicare Other | Source: Ambulatory Visit | Attending: Emergency Medicine | Admitting: Emergency Medicine

## 2013-03-28 ENCOUNTER — Encounter: Payer: Self-pay | Admitting: *Deleted

## 2013-03-28 ENCOUNTER — Encounter: Payer: Self-pay | Admitting: Cardiothoracic Surgery

## 2013-03-28 ENCOUNTER — Ambulatory Visit (INDEPENDENT_AMBULATORY_CARE_PROVIDER_SITE_OTHER): Payer: Medicare Other | Admitting: Cardiothoracic Surgery

## 2013-03-28 VITALS — BP 132/75 | HR 94 | Resp 16 | Ht 62.0 in | Wt 228.0 lb

## 2013-03-28 DIAGNOSIS — Z8541 Personal history of malignant neoplasm of cervix uteri: Secondary | ICD-10-CM | POA: Diagnosis not present

## 2013-03-28 DIAGNOSIS — C3490 Malignant neoplasm of unspecified part of unspecified bronchus or lung: Secondary | ICD-10-CM

## 2013-03-28 DIAGNOSIS — C3410 Malignant neoplasm of upper lobe, unspecified bronchus or lung: Secondary | ICD-10-CM

## 2013-03-28 DIAGNOSIS — J449 Chronic obstructive pulmonary disease, unspecified: Secondary | ICD-10-CM | POA: Diagnosis not present

## 2013-03-28 DIAGNOSIS — J4489 Other specified chronic obstructive pulmonary disease: Secondary | ICD-10-CM | POA: Diagnosis not present

## 2013-03-28 LAB — PULMONARY FUNCTION TEST
DL/VA % pred: 98 %
DL/VA: 4.48 ml/min/mmHg/L
DLCO unc % pred: 51 %
DLCO unc: 11.1 ml/min/mmHg
FEF 25-75 Post: 0.55 L/sec
FEF 25-75 Pre: 0.49 L/sec
FEF2575-%Change-Post: 10 %
FEF2575-%Pred-Post: 32 %
FEF2575-%Pred-Pre: 29 %
FEV1-%Change-Post: 8 %
FEV1-%Pred-Post: 49 %
FEV1-%Pred-Pre: 46 %
FEV1-Post: 1 L
FEV1-Pre: 0.93 L
FEV1FVC-%Change-Post: -5 %
FEV1FVC-%Pred-Pre: 80 %
FEV6-%Change-Post: 12 %
FEV6-%Pred-Post: 68 %
FEV6-%Pred-Pre: 60 %
FEV6-Post: 1.73 L
FEV6-Pre: 1.54 L
FEV6FVC-%Change-Post: -1 %
FEV6FVC-%Pred-Post: 104 %
FEV6FVC-%Pred-Pre: 105 %
FVC-%Change-Post: 14 %
FVC-%Pred-Post: 66 %
FVC-%Pred-Pre: 57 %
FVC-Post: 1.77 L
FVC-Pre: 1.54 L
Post FEV1/FVC ratio: 57 %
Post FEV6/FVC ratio: 99 %
Pre FEV1/FVC ratio: 60 %
Pre FEV6/FVC Ratio: 100 %
RV % pred: 144 %
RV: 3.1 L
TLC % pred: 159 %
TLC: 7.59 L

## 2013-03-28 MED ORDER — ALBUTEROL SULFATE (2.5 MG/3ML) 0.083% IN NEBU
2.5000 mg | INHALATION_SOLUTION | Freq: Once | RESPIRATORY_TRACT | Status: AC
  Administered 2013-03-28: 2.5 mg via RESPIRATORY_TRACT

## 2013-03-28 NOTE — [facility] Oncology Navigator Note (Signed)
Met patient at [facility] for patient's surgical consult with Dr. Gerhardt.  Patient reports that she is doing pretty well.  She denied any questions at this time.  I gave her my contact information and encouraged her to call me for any needs. 

## 2013-03-28 NOTE — Progress Notes (Signed)
[address]       [place] [address]             [phone]                      [facility] Record [id number] Date of Birth: Sep 08, 1940  Referring: Dr Lamonte Sakai  Primary Care: Osborne Casco, MD  Chief Complaint:  Lung cancer  History of Present Illness:    Taylor Berry 73 y.o. female is seen in the office  2 days ago for 5 year history of cough and history of Hx RA on immunosuppression, OSA, mild AFL with marginal BD response, diastolic dysfxn, borderline secondary PAH. With increased cough even with out URI patient had CT of chest and PET scan. She is referred to consider  Surgical resection of now know squamous cell CA of th lung left upper lobe. First attempt resulted in hemoptysis and the study was not completed. Repeat BX was done in the interval the mass has increased in size and increased cavitation.  Patient is limited by SOB 38mn walk test in office today resulted in cough and sob and o2 sats to 90%   The patient returns to the office today after the completion of her pulmonary function studies was a significant decrease in pulmonary function from her previous studies in 2011. Currently her FEV1 is 0.9 346% of predicted. Diffusion capacity has also dropped to 50% of predicted.  Current Activity/ Functional Status:  Patient is independent with mobility/ambulation, transfers, ADL's, IADL's.   Zubrod Score: At the time of surgery this patient's most appropriate activity status/level should be described as: '[]'$     0    Normal activity, no symptoms '[]'$     1    Restricted in physical strenuous activity but ambulatory, able to do out light work '[x]'$     2    Ambulatory and capable of self care, unable to do work activities, up and about               >50 % of waking hours                              '[]'$     3    Only limited self care, in bed greater than 50% of waking hours '[]'$     4    Completely disabled, no self care,  confined to bed or chair '[]'$     5    Moribund   Past Medical History  Diagnosis Date  . Hypertension   . Rheumatoid arthritis(714.0)   . Osteoporosis   . Allergic rhinitis   . Hyperlipidemia   . Obesity   . OSA (obstructive sleep apnea)   . Osteoarthritis   . Uterine cancer     Past Surgical History  Procedure Laterality Date  . Vesicovaginal fistula closure w/ tah  1971  . Tonsillectomy      Family History  Problem Relation Age of Onset  . Lung cancer Mother  age 73  . Coronary artery disease Son     MI at age 73  half sister deceased from breast ca 421's History   Social History  . Marital Status: Divorced    Spouse Name: N/A    Number of Children: 2  . Years of Education: N/A   Occupational History  . counselor   .  Social History Main Topics  . Smoking status: Former Smoker -- 1.00 packs/day for 35 years    Types: Cigarettes    Quit date: 01/06/1990  . Smokeless tobacco: Never Used  . Alcohol Use: No  . Drug Use: Not on file  . Sexual Activity: Not on file   Other Topics Concern  . Not on file   Social History Narrative  . No narrative on file    History  Smoking status  . Former Smoker -- 1.00 packs/day for 35 years  . Types: Cigarettes  . Quit date: 01/06/1990  Smokeless tobacco  . Never Used    History  Alcohol Use No     Allergies  Allergen Reactions  . Latex     REACTION: itching  . Ace Inhibitors     REACTION: cough  . Hydrochlorothiazide     REACTION: decreased sodium  . Penicillins     REACTION: intolerant due to yeast infection    Current Outpatient Prescriptions  Medication Sig Dispense Refill  . albuterol (VENTOLIN HFA) 108 (90 BASE) MCG/ACT inhaler Inhale 2 puffs into the lungs every 6 (six) hours as needed for wheezing or shortness of breath.       . Ascorbic Acid (VITAMIN C PO) Take 1 tablet by mouth daily.      Marland Kitchen aspirin EC 81 MG tablet Take 81 mg by mouth daily.      . cetirizine (ZYRTEC) 10 MG tablet Take  10 mg by mouth daily.      . Cholecalciferol (VITAMIN D PO) Take 1 tablet by mouth daily.       . famotidine (PEPCID) 20 MG tablet Take 20 mg by mouth at bedtime. One at bedtime      . fluticasone (FLONASE) 50 MCG/ACT nasal spray Place 2 sprays into the nose 2 (two) times daily as needed for allergies.      . folic acid (FOLVITE) [id number] MCG tablet Take 800 mcg by mouth daily.      Marland Kitchen olmesartan (BENICAR) 40 MG tablet Take 20 mg by mouth daily.      Marland Kitchen omeprazole (PRILOSEC) 40 MG capsule Take 40 mg by mouth daily.      . simvastatin (ZOCOR) 20 MG tablet Take 20 mg by mouth at bedtime.        Marland Kitchen tiotropium (SPIRIVA) 18 MCG inhalation capsule Place 18 mcg into inhaler and inhale daily.      Marland Kitchen venlafaxine (EFFEXOR-XR) 75 MG 24 hr capsule Take 75 mg by mouth daily.         No current facility-administered medications for this visit.     Review of Systems:     Cardiac Review of Systems: Y or N  Chest Pain Florencio.Farrier    ]  Resting SOB [  y ] Exertional SOB  Blue.Reese  ]  Vertell Limber Florencio.Farrier  ]   Pedal Edema [ n  ]    Palpitations [ n ] Syncope  [ n ]   Presyncope [ n  ]  General Review of Systems: [Y] = yes [  ]=no Constitional: recent weight change [ y 78 to 53 ];  Wt loss over the last 3 months [   ] anorexia [  ]; fatigue [ y ]; nausea [  ]; night sweats [  ]; fever [  ]; or chills [  ];          Dental: poor dentition[  ]; Last Dentist visit:   Eye : blurred vision [  ];  diplopia [   ]; vision changes [  ];  Amaurosis fugax[  ]; Resp: cough [ y ];  wheezing[y  ];  hemoptysis[ n ]; shortness of breath[y  ]; paroxysmal nocturnal dyspnea[ y ]; dyspnea on exertion[ y ]; or orthopnea[ n ];  GI:  gallstones[  ], vomiting[  ];  dysphagia[  ]; melena[  ];  hematochezia [  ]; heartburn[  ];   Hx of  Colonoscopy[y  ]; GU: kidney stones [  ]; hematuria[  ];   dysuria [  ];  nocturia[  ];  history of     obstruction [  ]; urinary frequency [  ]             Skin: rash, swelling[  ];, hair loss[  ];  peripheral edema[  ];  or  itching[  ]; Musculosketetal: myalgias[  ];  joint swelling[  ];  joint erythema[  ];  joint pain[  ];  back pain[  ];  Heme/Lymph: bruising[  ];  bleeding[  ];  anemia[  ];  Neuro: TIA[  ];  headaches[  ];  stroke[  ];  vertigo[  ];  seizures[  ];   paresthesias[  ];  difficulty walking[  ];  Psych:depression[  ]; anxiety[  ];  Endocrine: diabetes[  ];  thyroid dysfunction[  ];  Immunizations: Flu up to date [ y ]; Pneumococcal up to date Blue.Reese  ];  Other:  Physical Exam: BP 132/75  Pulse 94  Resp 16  Ht '5\' 2"'$  (1.575 m)  Wt 228 lb (103.42 kg)  BMI 41.69 kg/m2  SpO2 97%  PHYSICAL EXAMINATION:  General appearance: alert, cooperative, fatigued and no distress Neurologic: intact Heart: regular rate and rhythm, S1, S2 normal, no murmur, click, rub or gallop Lungs: diminished breath sounds bilaterally Abdomen: soft, non-tender; bowel sounds normal; no masses,  no organomegaly Extremities: extremities normal, atraumatic, no cyanosis or edema and Homans sign is negative, no sign of DVT no cartoid bruits, +1 dp and PT pulses bilaterial No cervical, axillary or supraclavicular adenopathy   Diagnostic Studies & Laboratory data:     Recent Radiology Findings:   Dg Chest 1 View  03/01/2013   CLINICAL DATA:  Post lung biopsy attempt.  EXAM: CHEST - 1 VIEW  COMPARISON:  CT chest 01/01/2014  FINDINGS: The heart size and mediastinal contours are stable. Small irregular nodular opacity in the left mid lung, corresponding to the cavitary nodule described on CT is unchanged. The right lung is clear. Negative for pneumothorax or pleural effusion. No acute osseous abnormality. The visualized skeletal structures are unremarkable.  IMPRESSION: 1. Negative for pneumothorax. 2. Unchanged left mid lung irregular nodule.   Electronically Signed   By: Curlene Dolphin M.D.   On: 03/01/2013 12:17   Ct Biopsy  03/16/2013   CLINICAL DATA Left upper lobe lung nodule and history of uterine and cervical carcinoma.  Attempt at CT-guided biopsy was aborted on 03/01/2013 due to development of hemoptysis. The patient has recovered and now presents for second attempt of percutaneous biopsy of the nodule.  EXAM CT GUIDED CORE BIOPSY OF LEFT LUNG NODULE  ANESTHESIA/SEDATION 4.0  Mg IV Versed; 100 mcg IV Fentanyl  Total Moderate Sedation Time: 25 minutes.  PROCEDURE The procedure risks, benefits, and alternatives were explained to the patient. Questions regarding the procedure were encouraged and answered. The patient understands and consents to the procedure.  The left anterior chest wall was prepped with Betadine in a sterile fashion,  and a sterile drape was applied covering the operative field. A sterile gown and sterile gloves were used for the procedure. Local anesthesia was provided with 1% Lidocaine.  The patient was placed in a supine position. Prior to localizing scanning and sterile prep, breast tissue was taped in order to decrease the skin to lesion distance. Utilizing low-dose CT fluoroscopic sequences with multiplanar reconstruction and 3D imaging, a 17 gauge trocar needle was advanced from an anterior approach to the lateral margin of a left upper lobe nodule. After confirming needle tip position, 2 separate 18 gauge core biopsy samples were obtained and placed in formalin. Additional CT was performed after needle removal.  Complications: Minimal pulmonary hemorrhage adjacent to the lesion.  FINDINGS Initial CT shows enlargement of the left upper lobe pulmonary nodule since the prior biopsy procedure with increased cavitation noted as well as increased soft tissue thickening posterolaterally. Maximal lesion dimensions on axial images currently estimated to be 2.3 x 3.0 cm. A more solid portion of the lesion along its lateral aspect was targeted. Solid core biopsy samples were obtained. The biopsy was complicated by a minimal amount of parenchymal hemorrhage adjacent to the nodule. No pneumothorax was present.   IMPRESSION CT-guided core biopsy performed of the cavitary left upper lobe nodule which demonstrates growth due to increased cavitation as well as increased component of solid tissue along the lateral and posterior aspect of the lesion. This more solid portion of the nodule was targeted for biopsy. Solid core biopsy samples were obtained.  SIGNATURE  Electronically Signed   By: Aletta Edouard M.D.   On: 03/16/2013 15:06   Ct Biopsy  03/01/2013   CLINICAL DATA:  73 year old with a cavitary lesion in the left upper lobe. Tissue diagnosis have been requested.  EXAM: ATTEMPTED CT BIOPSY OF LEFT LUNG LESION  Physician: Stephan Minister. Anselm Pancoast, MD  MEDICATIONS: 3 mg IV versed, 100 mcg IV fentanyl. A radiology nurse monitored the patient for moderate sedation.  ANESTHESIA/SEDATION: Moderate sedation time: 30 min  PROCEDURE: The procedure was explained to the patient. The risks and benefits of the procedure were discussed and the patient's questions were addressed. Specifically, the risks of pneumothorax and hemoptysis were discussed with the patient. Informed consent was obtained from the patient. Patient was placed supine on the CT scanner. Images through the chest were obtained. Lesion in the left upper lobe was identified. The left arm was elevated. The lateral aspect of the left breast was prepped and draped in a sterile fashion. Skin was anesthetized with 1% lidocaine. The patient was intermittently coughing before and during the procedure. Due to the patient's coughing and breathing, it was difficult to direct the 17 gauge needle into the left upper lobe lesion. 46 gauge needle was identified adjacent to the lesion but not within the lesion. Patient continued to cough throughout the procedure until a small amount of hemoptysis was noted. Patient's vital signs were stable despite the hemoptysis. Due to the hemoptysis, the procedure was stopped. Bandages were placed over the puncture sites. Patient was placed on her left  side. The patient was monitored in the nursing station for 1 hr and had a follow-up chest radiograph. The hemoptysis resolved immediately after the procedure. Chest radiograph showed no evidence for pneumothorax. Patient was then transferred to Short Stay for additional recovery.  COMPLICATIONS: The procedure was stopped due to a small amount of hemoptysis. The hemoptysis resolved after the procedure.  FINDINGS: 1.6 cm cavitary lesion in the left upper lobe. Needle position  was adjacent to the lesion but not within the lesion. A small amount of parenchymal hemorrhage adjacent to the lesion. Due to a small amount of hemoptysis, the procedure was stopped prior to obtaining a biopsy.  IMPRESSION: Attempted CT-guided biopsy of the left upper lung lesion. The procedure was stopped due to hemoptysis.   Electronically Signed   By: Markus Daft M.D.   On: 03/01/2013 13:56    PET: CLINICAL DATA: Initial treatment strategy for lung nodule.  EXAM:  NUCLEAR MEDICINE PET SKULL BASE TO THIGH  FASTING BLOOD GLUCOSE: Value: 111 mg/dl  TECHNIQUE:  12.0 mCi F-18 FDG was injected intravenously. Full-ring PET imaging  was performed from the skull base to thigh after the radiotracer. CT  data was obtained and used for attenuation correction and anatomic  localization.  COMPARISON: CT CHEST W/CM dated 02/01/2013  FINDINGS:  NECK  No hypermetabolic lymph nodes in the neck. CT images show no acute  findings.  CHEST  An irregular nodule with internal lucency in the lingula measures 10  x 15 mm and has an SUV max of 5.3 (PET image 58). Reticular  subpleural densities in the lingula and lateral left lower lobe  (example CT image 74) appear hypermetabolic as well (example PET  image 74). No additional areas of abnormal hypermetabolism in the  lungs. No hypermetabolic mediastinal or hilar lymph nodes.  CT images show atherosclerotic calcification of the arterial  vasculature, including coronary arteries. Heart is at the  upper  limits of normal in size. No pericardial or pleural effusion.  ABDOMEN/PELVIS  No abnormal hypermetabolism in the liver, adrenal glands, spleen or  pancreas. No hypermetabolic lymph nodes.  CT images show a 10 mm low-attenuation lesion in the dome of the  liver, as before. Liver, gallbladder, adrenal glands, kidneys,  spleen, pancreas, stomach and bowel are otherwise grossly  unremarkable. Small periumbilical hernia contains fat. Hysterectomy.  Atherosclerotic calcification of the arterial vasculature without  abdominal aortic aneurysm.  SKELETON  No abnormal osseous hypermetabolism.  IMPRESSION:  1. Hypermetabolic irregular left upper lobe nodule is highly  worrisome for primary bronchogenic carcinoma.  2. Reticular subpleural opacities in the lingula and lateral left  lower lobe are mildly hypermetabolic but nonspecific. Findings do  not appear masslike, but rather fibrotic. Difficult to definitively  exclude malignancy. Continued attention on followup exams is  warranted.  3. Small periumbilical hernia contains fat.  Electronically Signed  By: Lorin Picket M.D.  On: 02/17/2013 14:21  PFT's: 03/28/2013 done today FEV1 0.92 46%, DLCO 11.1 51%   Pulmonary Function Test  Date: 07/20/2009  Height (in.): 64  Gender: Female  Pre-Spirometry  FVC Value: 1.93 L/min Pred: 2.88 L/min % Pred: 67 %  FEV1 Value: 1.31 L Pred: 2.06 L % Pred: 64 %  FEV1/FVC Value: 68 % Pred: 72 % FEF 25-75 Value: 0.86 L/min Pred: 2.35 L/min % Pred: 36 %  Post-Spirometry  FVC Value: 1.82 L/min Pred: 2.88 L/min % Pred: 63 %  FEV1 Value: 1.24 L Pred: 2.06 L % Pred: 60 %  FEV1/FVC Value: 68 % Pred: 72 % FEF 25-75 Value: 0.74 L/min Pred: 2.35 L/min % Pred: 31 %  Lung Volumes  TLC Value: 3.49 L % Pred: 72 %  RV Value: 1.55 L % Pred: 81 %  DLCO Value: 15.4 % % Pred: 58 %  DLCO/VA Value: 5.38 % % Pred: 151 %  Comments:  Mild to mod AFL, no BD response, restricted volumes, DLCO corrects to normal  range for Va.  RSV    Recent Lab Findings: Lab Results  Component Value Date   WBC 13.2* 03/16/2013   HGB 10.5* 03/16/2013   HCT 31.5* 03/16/2013   PLT 126* 03/16/2013   GLUCOSE 107* 01/27/2013   NA 138 01/27/2013   K 4.4 01/27/2013   CL 103 01/27/2013   CREATININE 0.8 01/27/2013   BUN 16 01/27/2013   CO2 28 01/27/2013   INR 0.99 03/16/2013   PATH: #[id number] [place] Diagnosis Lung, needle/core biopsy(ies), Left upper lobe - INVASIVE SQUAMOUS CELL CARCINOMA. - SEE COMMENT. Microscopic Comment The malignant cells are positive for cytokeratin 5/6, p63, and cytokeratin 7. They are negative for cytokeratin 20, napsin-A, and TTF-1. This profile supports a diagnosis of squamous cell carcinoma. Enid Cutter MD Pathologist, Electronic Signature (Case signed 03/21/2013)  Assessment / Plan:   Clinical Stage I squamous cell ca of the left upper lobe in patient  With limited pulmonary reserve and on methotrexate and Remicade. With continued decrease in her exercise capacity and very limited pulmonary reserve I do not think she is a candidate for lobectomy, and probably a poor candidate for wedge resection considering her numerous other medical problems. Prior to making a definite decision I've asked her to see the radiation oncologist in regard to possible stereotactic radiotherapy as a treatment option. She will again be presented at the Thursday [place] conference.    [name] MD      [address] [place] [address] Office [phone]   Beeper [phone]  03/28/2013 5:37 PM

## 2013-03-29 ENCOUNTER — Encounter: Payer: Self-pay | Admitting: Radiation Oncology

## 2013-03-29 ENCOUNTER — Encounter: Payer: Self-pay | Admitting: *Deleted

## 2013-03-29 NOTE — [facility] Oncology Navigator Note (Signed)
I called patient to f/u from her appointment with Dr. Servando Snare 3/20.  Patient reports that she is doing well.  She is scheduled to see Dr. Pablo Ledger 3/25 for evaluation.  Patient denies any questions or concerns at this time.  I encouraged her to call me with any needs.

## 2013-03-29 NOTE — Progress Notes (Addendum)
Thoracic Location of Tumor / Histology:Invasive squamous cell carcinoma of left upper lobe.  Patient presented with increased shortness of breath which led to having a chest x-ray which revealed nodule on 01/21/2013.  Biopsies of left upper lobe on 03/16/13/ct guided core biopsy Lung, needle/core biopsy(ies), Left upper lobe - INVASIVE SQUAMOUS CELL CARCINOMA. - SEE COMMENT. Microscopic Comment 03/01/13 first attempt of left upper lobe biopsy unsuccessful secondary to shortness of breath and hempotysis.  Tobacco/Marijuana/Snuff/ETOH JKK:XFGH smoking 1990, 1 ppd for 35 years.  Past/Anticipated interventions by cardiothoracic surgery, if WEX:HBZJIRC in [place] on 03/31/13 and make decision afterwards.  Past/Anticipated interventions by medical oncology, if any:Not scheduled yet.  Signs/Symptoms  Weight changes, if VEL:FYBOF  Respiratory complaints, if BPZ:WCHENIDPO of breath and cough  Hemoptysis, if EUM:PNTIRW attempt of lung biopsy  Pain issues, if any: None  SAFETY ISSUES:  Prior radiation? No  Pacemaker/ICD?No  Possible current pregnancy? No  Is the patient on methotrexate?  Current Complaints / other details:divorced mother of 1 child and 1 deceased.Marland Kitchen Retired from Product manager as therapistPatient very anxious and ready to know what plan of care is.

## 2013-03-30 ENCOUNTER — Ambulatory Visit
Admission: RE | Admit: 2013-03-30 | Discharge: 2013-03-30 | Disposition: A | Discharge status: Home / Self Care | Payer: Medicare Other | Source: Ambulatory Visit | Attending: Radiation Oncology | Admitting: Radiation Oncology

## 2013-03-30 VITALS — BP 128/81 | HR 84 | Temp 98.0°F | Resp 20 | Wt 226.6 lb

## 2013-03-30 DIAGNOSIS — J4489 Other specified chronic obstructive pulmonary disease: Secondary | ICD-10-CM | POA: Insufficient documentation

## 2013-03-30 DIAGNOSIS — I10 Essential (primary) hypertension: Secondary | ICD-10-CM | POA: Diagnosis not present

## 2013-03-30 DIAGNOSIS — E785 Hyperlipidemia, unspecified: Secondary | ICD-10-CM | POA: Diagnosis not present

## 2013-03-30 DIAGNOSIS — Z79899 Other long term (current) drug therapy: Secondary | ICD-10-CM | POA: Insufficient documentation

## 2013-03-30 DIAGNOSIS — J449 Chronic obstructive pulmonary disease, unspecified: Secondary | ICD-10-CM | POA: Insufficient documentation

## 2013-03-30 DIAGNOSIS — C3410 Malignant neoplasm of upper lobe, unspecified bronchus or lung: Secondary | ICD-10-CM | POA: Diagnosis not present

## 2013-03-30 DIAGNOSIS — G4733 Obstructive sleep apnea (adult) (pediatric): Secondary | ICD-10-CM | POA: Insufficient documentation

## 2013-03-30 DIAGNOSIS — Z7982 Long term (current) use of aspirin: Secondary | ICD-10-CM | POA: Insufficient documentation

## 2013-03-30 DIAGNOSIS — Z87891 Personal history of nicotine dependence: Secondary | ICD-10-CM | POA: Insufficient documentation

## 2013-03-30 DIAGNOSIS — R911 Solitary pulmonary nodule: Secondary | ICD-10-CM

## 2013-03-30 DIAGNOSIS — M810 Age-related osteoporosis without current pathological fracture: Secondary | ICD-10-CM | POA: Diagnosis not present

## 2013-03-30 DIAGNOSIS — E669 Obesity, unspecified: Secondary | ICD-10-CM | POA: Insufficient documentation

## 2013-03-30 DIAGNOSIS — M069 Rheumatoid arthritis, unspecified: Secondary | ICD-10-CM | POA: Diagnosis not present

## 2013-03-30 HISTORY — DX: Chronic obstructive pulmonary disease, unspecified: J44.9

## 2013-03-30 NOTE — Progress Notes (Signed)
Radiation Oncology         [phone]) [phone] ________________________________  Initial outpatient Consultation - Date: 03/30/2013   Name: Taylor Berry MRN: [id number]   DOB: [birth year]  REFERRING PHYSICIAN: [name], MD  DIAGNOSIS: T1N0 SCCa of the Left Upper Lobe  HISTORY OF PRESENT ILLNESS::Taylor Berry is a 73 y.o. female  who presented with increased shortness of breath and had a chest x-ray in January. This revealed a left upper lobe nodule. A CT of the chest on 02/01/2013 correlated with this measuring 1.4 x 1.3 cm in the left upper lobe. No enlarged mediastinal lymph nodes were noted. A PET scan was ordered on 02/17/2013 which showed a hypermetabolic left upper lobe nodule with no evidence of metastatic disease. A CT-guided biopsy was attempted on 03/01/2013 but was stopped early due to bleeding. A successful biopsy was performed on 03/16/2013 which showed invasive squamous cell carcinoma. She met with Dr. Servando Snare and had PFTs performed. This showed a significant decrease with an FEV1 of 0.93 and a diffusion capacity to 50% of predicted. She is ambulatory and performs her activities of daily living. She does become short of breath with more than 20 except. She has come off of her methotrexate and Remicade which she has been off for about 2 weeks. Her main complaint symptomatically her cough which is struggle with for about a year and shortness of breath symptoms. Her cough is worse in the morning and when lying down flat. She does note that she is recovering alcoholic and benzodiazepine addict (quit and was in treatment in the 1990s). She quit smoking many years ago. She denies any hemoptysis bone pain or headaches. She is not on home oxygen. She has lost 30 pounds as part of a weight loss program prescribed by her primary care physician.  PREVIOUS RADIATION THERAPY: No  PAST MEDICAL HISTORY:  has a past medical history of Hypertension; Rheumatoid arthritis(714.0); Osteoporosis;  Allergic rhinitis; Hyperlipidemia; Obesity; OSA (obstructive sleep apnea); Osteoarthritis; Uterine cancer; and COPD (chronic obstructive pulmonary disease).    PAST SURGICAL HISTORY: Past Surgical History  Procedure Laterality Date  . Vesicovaginal fistula closure w/ tah  1971  . Tonsillectomy      FAMILY HISTORY:  Family History  Problem Relation Age of Onset  . Lung cancer Mother   . Coronary artery disease Son     MI at age 62    SOCIAL HISTORY:  History  Substance Use Topics  . Smoking status: Former Smoker -- 1.00 packs/day for 35 years    Types: Cigarettes    Quit date: 01/06/1990  . Smokeless tobacco: Never Used  . Alcohol Use: No    ALLERGIES: Latex; Ace inhibitors; Hydrochlorothiazide; and Penicillins  MEDICATIONS:  Current Outpatient Prescriptions  Medication Sig Dispense Refill  . albuterol (VENTOLIN HFA) 108 (90 BASE) MCG/ACT inhaler Inhale 2 puffs into the lungs every 6 (six) hours as needed for wheezing or shortness of breath.       . Ascorbic Acid (VITAMIN C PO) Take 1 tablet by mouth daily.      Marland Kitchen aspirin EC 81 MG tablet Take 81 mg by mouth daily.      . cetirizine (ZYRTEC) 10 MG tablet Take 10 mg by mouth daily.      . Cholecalciferol (VITAMIN D PO) Take 1 tablet by mouth daily.       . famotidine (PEPCID) 20 MG tablet Take 20 mg by mouth at bedtime. One at bedtime      .  fluticasone (FLONASE) 50 MCG/ACT nasal spray Place 2 sprays into the nose 2 (two) times daily as needed for allergies.      . folic acid (FOLVITE) 546 MCG tablet Take 800 mcg by mouth daily.      Marland Kitchen olmesartan (BENICAR) 40 MG tablet Take 20 mg by mouth daily.      Marland Kitchen omeprazole (PRILOSEC) 40 MG capsule Take 40 mg by mouth daily.      . simvastatin (ZOCOR) 20 MG tablet Take 20 mg by mouth at bedtime.        Marland Kitchen tiotropium (SPIRIVA) 18 MCG inhalation capsule Place 18 mcg into inhaler and inhale daily.      Marland Kitchen venlafaxine (EFFEXOR-XR) 75 MG 24 hr capsule Take 75 mg by mouth daily.         No  current facility-administered medications for this encounter.    REVIEW OF SYSTEMS:  A 15 point review of systems is documented in the electronic medical record. This was obtained by the nursing staff. However, I reviewed this with the patient to discuss relevant findings and make appropriate changes.  Pertinent items are noted in HPI.  PHYSICAL EXAM:  Filed Vitals:   03/30/13 1521  BP: 128/81  Pulse: 84  Temp: 98 F (36.7 C)  Resp: 20  .226 lb 9.6 oz (102.785 kg). She is a pleasant female who appears younger than her stated age. She is sitting comfortably on examining table. Breathing is normal. She has clear breath sounds bilaterally. He has 5 out of 5 strength bilaterally.  LABORATORY DATA:  Lab Results  Component Value Date   WBC 13.2* 03/16/2013   HGB 10.5* 03/16/2013   HCT 31.5* 03/16/2013   MCV 93.2 03/16/2013   PLT 126* 03/16/2013   Lab Results  Component Value Date   NA 138 01/27/2013   K 4.4 01/27/2013   CL 103 01/27/2013   CO2 28 01/27/2013   No results found for this basename: ALT, AST, GGT, ALKPHOS, BILITOT     RADIOGRAPHY: Dg Chest 1 View  03/01/2013   CLINICAL DATA:  Post lung biopsy attempt.  EXAM: CHEST - 1 VIEW  COMPARISON:  CT chest 01/01/2014  FINDINGS: The heart size and mediastinal contours are stable. Small irregular nodular opacity in the left mid lung, corresponding to the cavitary nodule described on CT is unchanged. The right lung is clear. Negative for pneumothorax or pleural effusion. No acute osseous abnormality. The visualized skeletal structures are unremarkable.  IMPRESSION: 1. Negative for pneumothorax. 2. Unchanged left mid lung irregular nodule.   Electronically Signed   By: Curlene Dolphin M.D.   On: 03/01/2013 12:17       IMPRESSION: T1 N0 squamous cell carcinoma of the left upper lobe  PLAN: I spoke to the patient today regarding stereotactic radiosurgery in the management of her disease. We discussed the process of simulation and the making a  mold. We discussed the use of respiratory compression. We discussed that SBRT is very effective in local control. We discussed the possible mediastinal failures that can happen as a result of the mediastinum not being evaluated other than with a PET scan and not being treated. She is concerned regarding claustrophobia and her cough. She did state that she take a Xanax prior to her PET scan which allowed her to complete that study. She is also is interested in surgery as she does like the idea of having her lymph nodes evaluated at the time of her procedure. We discussed the low likelihood  of any acute events during radiation. I told her she might a little bit tired. I thought that SBRT to this lesion would have a low likelihood of affecting her respiratory function. I also think it would have a low but real chance of causing rib fracture just given its location in the superior anterior aspect of the left upper lobe near the ribs. At this point she is on for multidose and her conference tomorrow. She would like [place] to discuss her case and then give her a call back with recommendations. I told her there were no randomized trials which would 8 in this discussion and eventually would be her decision regarding the side effects he was willing to tolerate.  I spent 55 minutes  face to face with the patient and more than 50% of that time was spent in counseling and/or coordination of care.   ------------------------------------------------  Thea Silversmith, MD

## 2013-03-30 NOTE — Progress Notes (Signed)
Please see the Nurse Progress Note in the MD Initial Consult Encounter for this patient. 

## 2013-03-31 NOTE — Addendum Note (Signed)
Encounter addended by: Deirdre Evener, RN on: 03/31/2013  9:56 AM<BR>     Documentation filed: Charges VN

## 2013-04-03 ENCOUNTER — Other Ambulatory Visit: Payer: Self-pay | Admitting: Internal Medicine

## 2013-04-05 ENCOUNTER — Ambulatory Visit: Payer: Medicare Other | Admitting: Cardiothoracic Surgery

## 2013-04-07 ENCOUNTER — Other Ambulatory Visit: Payer: Self-pay | Admitting: *Deleted

## 2013-04-07 ENCOUNTER — Ambulatory Visit (INDEPENDENT_AMBULATORY_CARE_PROVIDER_SITE_OTHER): Payer: Medicare Other | Admitting: Cardiothoracic Surgery

## 2013-04-07 ENCOUNTER — Encounter: Payer: Self-pay | Admitting: Cardiothoracic Surgery

## 2013-04-07 VITALS — BP 137/88 | HR 79 | Resp 18 | Ht 62.0 in | Wt 226.0 lb

## 2013-04-07 DIAGNOSIS — Z8541 Personal history of malignant neoplasm of cervix uteri: Secondary | ICD-10-CM

## 2013-04-07 DIAGNOSIS — J449 Chronic obstructive pulmonary disease, unspecified: Secondary | ICD-10-CM | POA: Diagnosis not present

## 2013-04-07 DIAGNOSIS — J4489 Other specified chronic obstructive pulmonary disease: Secondary | ICD-10-CM

## 2013-04-07 DIAGNOSIS — C3410 Malignant neoplasm of upper lobe, unspecified bronchus or lung: Secondary | ICD-10-CM | POA: Diagnosis not present

## 2013-04-07 DIAGNOSIS — C3490 Malignant neoplasm of unspecified part of unspecified bronchus or lung: Secondary | ICD-10-CM

## 2013-04-07 NOTE — Progress Notes (Signed)
[place] Record #[id number] Date of Birth: November 14, 1940  Referring: Dr Lamonte Sakai  Primary Care: Osborne Casco, MD  Chief Complaint:  Lung cancer  History of Present Illness:    [facility] 73 y.o. female is seen in the office for 5 year history of cough and history of Hx RA on immunosuppression, OSA, mild AFL with marginal BD response, diastolic dysfxn, borderline secondary PAH. With increased cough even with out URI patient had CT of chest and PET scan. She is referred to consider  Surgical resection of now know squamous cell CA of th lung left upper lobe. First attempt resulted in hemoptysis and the study was not completed. Repeat BX was done in the interval the mass has increased in size and increased cavitation.  Patient is limited by SOB 80min walk test in office today resulted in cough and sob and o2 sats to 90%   The patient returns to the office today after the completion of her pulmonary function studies was a significant decrease in pulmonary function from her previous studies in 2011. Currently her FEV1 is 0.9 46% of predicted. Diffusion capacity has also dropped to 50% of predicted.  The patient has seen Dr. Pablo Ledger in radiation oncology for consideration of stereotactic radiotherapy the risks and options of each approach have been discussed with her in detail and she returns today wishing to proceed with surgical resection she is aware that this is an increase risk risk because of her underlying medical problems and limited pulmonary reserve.   Current Activity/ Functional Status:  Patient is independent with mobility/ambulation, transfers, ADL's, IADL's.   Zubrod Score: At the time of surgery this patient's most appropriate activity status/level should be described as: []     0    Normal activity, no symptoms []     1    Restricted in physical strenuous activity but ambulatory, able to do out light work [x]     2     Ambulatory and capable of self care, unable to do work activities, up and about               >50 % of waking hours                              []     3    Only limited self care, in bed greater than 50% of waking hours []     4    Completely disabled, no self care, confined to bed or chair []     5    Moribund   Past Medical History  Diagnosis Date  . Hypertension   . Rheumatoid arthritis(714.0)   . Osteoporosis   . Allergic rhinitis   . Hyperlipidemia   . Obesity   . OSA (obstructive sleep apnea)   . Osteoarthritis   . Uterine cancer   . COPD (chronic obstructive pulmonary disease)     cervical cancer    Past Surgical History  Procedure Laterality Date  . Vesicovaginal fistula closure w/ tah  1971  . Tonsillectomy      Family History  Problem Relation Age of Onset  . Lung cancer Mother  age 43   . Coronary artery disease Son     MI at age 46   half sister deceased from breast ca 76's  History   Social  History  . Marital Status: Divorced    Spouse Name: N/A    Number of Children: 2  . Years of Education: N/A   Occupational History  . counselor   .     Social History Main Topics  . Smoking status: Former Smoker -- 1.00 packs/day for 35 years    Types: Cigarettes    Quit date: 01/06/1990  . Smokeless tobacco: Never Used  . Alcohol Use: No  . Drug Use: Not on file  . Sexual Activity: Not on file   Other Topics Concern  . Not on file   Social History Narrative  . No narrative on file    History  Smoking status  . Former Smoker -- 1.00 packs/day for 35 years  . Types: Cigarettes  . Quit date: 01/06/1990  Smokeless tobacco  . Never Used    History  Alcohol Use No     Allergies  Allergen Reactions  . Latex     REACTION: itching  . Ace Inhibitors     REACTION: cough  . Hydrochlorothiazide     REACTION: decreased sodium  . Penicillins     REACTION: intolerant due to yeast infection    Current Outpatient Prescriptions  Medication Sig  Dispense Refill  . albuterol (VENTOLIN HFA) 108 (90 BASE) MCG/ACT inhaler Inhale 2 puffs into the lungs every 6 (six) hours as needed for wheezing or shortness of breath.       . Ascorbic Acid (VITAMIN C PO) Take 1 tablet by mouth daily.      Marland Kitchen aspirin EC 81 MG tablet Take 81 mg by mouth daily.      . cetirizine (ZYRTEC) 10 MG tablet Take 10 mg by mouth daily.      . Cholecalciferol (VITAMIN D PO) Take 1 tablet by mouth daily.       . famotidine (PEPCID) 20 MG tablet Take 20 mg by mouth at bedtime. One at bedtime      . famotidine (PEPCID) 20 MG tablet TAKE 1 TABLET BY MOUTH AT BEDTIME  30 tablet  5  . fluticasone (FLONASE) 50 MCG/ACT nasal spray Place 2 sprays into the nose 2 (two) times daily as needed for allergies.      . folic acid (FOLVITE) 119 MCG tablet Take 800 mcg by mouth daily.      Marland Kitchen olmesartan (BENICAR) 40 MG tablet Take 20 mg by mouth daily.      Marland Kitchen omeprazole (PRILOSEC) 40 MG capsule Take 40 mg by mouth daily.      . simvastatin (ZOCOR) 20 MG tablet Take 20 mg by mouth at bedtime.        Marland Kitchen tiotropium (SPIRIVA) 18 MCG inhalation capsule Place 18 mcg into inhaler and inhale daily.      Marland Kitchen venlafaxine (EFFEXOR-XR) 75 MG 24 hr capsule Take 75 mg by mouth daily.         No current facility-administered medications for this visit.     Review of Systems:     Cardiac Review of Systems: Y or N  Chest Pain Florencio.Farrier    ]  Resting SOB [  y ] Exertional SOB  Blue.Reese  ]  Vertell Limber Florencio.Farrier  ]   Pedal Edema [ n  ]    Palpitations [ n ] Syncope  [ n ]   Presyncope [ n  ]  General Review of Systems: [Y] = yes [  ]=no Constitional: recent weight change [ y 101 to 5 ];  Wt loss over  the last 3 months [   ] anorexia [  ]; fatigue [ y ]; nausea [  ]; night sweats [  ]; fever [  ]; or chills [  ];          Dental: poor dentition[  ]; Last Dentist visit:   Eye : blurred vision [  ]; diplopia [   ]; vision changes [  ];  Amaurosis fugax[  ]; Resp: cough [ y ];  wheezing[y  ];  hemoptysis[ n ]; shortness of  breath[y  ]; paroxysmal nocturnal dyspnea[ y ]; dyspnea on exertion[ y ]; or orthopnea[ n ];  GI:  gallstones[  ], vomiting[  ];  dysphagia[  ]; melena[  ];  hematochezia [  ]; heartburn[  ];   Hx of  Colonoscopy[y  ]; GU: kidney stones [  ]; hematuria[  ];   dysuria [  ];  nocturia[  ];  history of     obstruction [  ]; urinary frequency [  ]             Skin: rash, swelling[  ];, hair loss[  ];  peripheral edema[  ];  or itching[  ]; Musculosketetal: myalgias[  ];  joint swelling[  ];  joint erythema[  ];  joint pain[  ];  back pain[  ];  Heme/Lymph: bruising[  ];  bleeding[  ];  anemia[  ];  Neuro: TIA[  ];  headaches[  ];  stroke[  ];  vertigo[  ];  seizures[  ];   paresthesias[  ];  difficulty walking[  ];  Psych:depression[  ]; anxiety[  ];  Endocrine: diabetes[  ];  thyroid dysfunction[  ];  Immunizations: Flu up to date [ y ]; Pneumococcal up to date Blue.Reese  ];  Other:  Physical Exam: BP 137/88  Pulse 79  Resp 18  Ht 5\' 2"  (1.575 m)  Wt 226 lb (102.513 kg)  BMI 41.33 kg/m2  SpO2 98%  PHYSICAL EXAMINATION:  General appearance: alert, cooperative, fatigued and no distress Neurologic: intact Heart: regular rate and rhythm, S1, S2 normal, no murmur, click, rub or gallop Lungs: diminished breath sounds bilaterally Abdomen: soft, non-tender; bowel sounds normal; no masses,  no organomegaly Extremities: extremities normal, atraumatic, no cyanosis or edema and Homans sign is negative, no sign of DVT no cartoid bruits, +1 dp and PT pulses bilaterial No cervical, axillary or supraclavicular adenopathy   Diagnostic Studies & Laboratory data:     Recent Radiology Findings:   Dg Chest 1 View  03/01/2013   CLINICAL DATA:  Post lung biopsy attempt.  EXAM: CHEST - 1 VIEW  COMPARISON:  CT chest 01/01/2014  FINDINGS: The heart size and mediastinal contours are stable. Small irregular nodular opacity in the left mid lung, corresponding to the cavitary nodule described on CT is unchanged. The  right lung is clear. Negative for pneumothorax or pleural effusion. No acute osseous abnormality. The visualized skeletal structures are unremarkable.  IMPRESSION: 1. Negative for pneumothorax. 2. Unchanged left mid lung irregular nodule.   Electronically Signed   By: Curlene Dolphin M.D.   On: 03/01/2013 12:17   Ct Biopsy  03/16/2013   CLINICAL DATA Left upper lobe lung nodule and history of uterine and cervical carcinoma. Attempt at CT-guided biopsy was aborted on 03/01/2013 due to development of hemoptysis. The patient has recovered and now presents for second attempt of percutaneous biopsy of the nodule.  EXAM CT GUIDED CORE BIOPSY OF LEFT LUNG NODULE  ANESTHESIA/SEDATION 4.0  Mg IV Versed; 100 mcg IV Fentanyl  Total Moderate Sedation Time: 25 minutes.  PROCEDURE The procedure risks, benefits, and alternatives were explained to the patient. Questions regarding the procedure were encouraged and answered. The patient understands and consents to the procedure.  The left anterior chest wall was prepped with Betadine in a sterile fashion, and a sterile drape was applied covering the operative field. A sterile gown and sterile gloves were used for the procedure. Local anesthesia was provided with 1% Lidocaine.  The patient was placed in a supine position. Prior to localizing scanning and sterile prep, breast tissue was taped in order to decrease the skin to lesion distance. Utilizing low-dose CT fluoroscopic sequences with multiplanar reconstruction and 3D imaging, a 17 gauge trocar needle was advanced from an anterior approach to the lateral margin of a left upper lobe nodule. After confirming needle tip position, 2 separate 18 gauge core biopsy samples were obtained and placed in formalin. Additional CT was performed after needle removal.  Complications: Minimal pulmonary hemorrhage adjacent to the lesion.  FINDINGS Initial CT shows enlargement of the left upper lobe pulmonary nodule since the prior biopsy  procedure with increased cavitation noted as well as increased soft tissue thickening posterolaterally. Maximal lesion dimensions on axial images currently estimated to be 2.3 x 3.0 cm. A more solid portion of the lesion along its lateral aspect was targeted. Solid core biopsy samples were obtained. The biopsy was complicated by a minimal amount of parenchymal hemorrhage adjacent to the nodule. No pneumothorax was present.  IMPRESSION CT-guided core biopsy performed of the cavitary left upper lobe nodule which demonstrates growth due to increased cavitation as well as increased component of solid tissue along the lateral and posterior aspect of the lesion. This more solid portion of the nodule was targeted for biopsy. Solid core biopsy samples were obtained.  SIGNATURE  Electronically Signed   By: Aletta Edouard M.D.   On: 03/16/2013 15:06   Ct Biopsy  03/01/2013   CLINICAL DATA:  73 year old with a cavitary lesion in the left upper lobe. Tissue diagnosis have been requested.  EXAM: ATTEMPTED CT BIOPSY OF LEFT LUNG LESION  Physician: Stephan Minister. Anselm Pancoast, MD  MEDICATIONS: 3 mg IV versed, 100 mcg IV fentanyl. A radiology nurse monitored the patient for moderate sedation.  ANESTHESIA/SEDATION: Moderate sedation time: 30 min  PROCEDURE: The procedure was explained to the patient. The risks and benefits of the procedure were discussed and the patient's questions were addressed. Specifically, the risks of pneumothorax and hemoptysis were discussed with the patient. Informed consent was obtained from the patient. Patient was placed supine on the CT scanner. Images through the chest were obtained. Lesion in the left upper lobe was identified. The left arm was elevated. The lateral aspect of the left breast was prepped and draped in a sterile fashion. Skin was anesthetized with 1% lidocaine. The patient was intermittently coughing before and during the procedure. Due to the patient's coughing and breathing, it was difficult to  direct the 17 gauge needle into the left upper lobe lesion. 13 gauge needle was identified adjacent to the lesion but not within the lesion. Patient continued to cough throughout the procedure until a small amount of hemoptysis was noted. Patient's vital signs were stable despite the hemoptysis. Due to the hemoptysis, the procedure was stopped. Bandages were placed over the puncture sites. Patient was placed on her left side. The patient was monitored in the nursing station for 1 hr and had  a follow-up chest radiograph. The hemoptysis resolved immediately after the procedure. Chest radiograph showed no evidence for pneumothorax. Patient was then transferred to Short Stay for additional recovery.  COMPLICATIONS: The procedure was stopped due to a small amount of hemoptysis. The hemoptysis resolved after the procedure.  FINDINGS: 1.6 cm cavitary lesion in the left upper lobe. Needle position was adjacent to the lesion but not within the lesion. A small amount of parenchymal hemorrhage adjacent to the lesion. Due to a small amount of hemoptysis, the procedure was stopped prior to obtaining a biopsy.  IMPRESSION: Attempted CT-guided biopsy of the left upper lung lesion. The procedure was stopped due to hemoptysis.   Electronically Signed   By: Markus Daft M.D.   On: 03/01/2013 13:56    PET: CLINICAL DATA: Initial treatment strategy for lung nodule.  EXAM:  NUCLEAR MEDICINE PET SKULL BASE TO THIGH  FASTING BLOOD GLUCOSE: Value: 111 mg/dl  TECHNIQUE:  12.0 mCi F-18 FDG was injected intravenously. Full-ring PET imaging  was performed from the skull base to thigh after the radiotracer. CT  data was obtained and used for attenuation correction and anatomic  localization.  COMPARISON: CT CHEST W/CM dated 02/01/2013  FINDINGS:  NECK  No hypermetabolic lymph nodes in the neck. CT images show no acute  findings.  CHEST  An irregular nodule with internal lucency in the lingula measures 10  x 15 mm and has an SUV  max of 5.3 (PET image 58). Reticular  subpleural densities in the lingula and lateral left lower lobe  (example CT image 74) appear hypermetabolic as well (example PET  image 74). No additional areas of abnormal hypermetabolism in the  lungs. No hypermetabolic mediastinal or hilar lymph nodes.  CT images show atherosclerotic calcification of the arterial  vasculature, including coronary arteries. Heart is at the upper  limits of normal in size. No pericardial or pleural effusion.  ABDOMEN/PELVIS  No abnormal hypermetabolism in the liver, adrenal glands, spleen or  pancreas. No hypermetabolic lymph nodes.  CT images show a 10 mm low-attenuation lesion in the dome of the  liver, as before. Liver, gallbladder, adrenal glands, kidneys,  spleen, pancreas, stomach and bowel are otherwise grossly  unremarkable. Small periumbilical hernia contains fat. Hysterectomy.  Atherosclerotic calcification of the arterial vasculature without  abdominal aortic aneurysm.  SKELETON  No abnormal osseous hypermetabolism.  IMPRESSION:  1. Hypermetabolic irregular left upper lobe nodule is highly  worrisome for primary bronchogenic carcinoma.  2. Reticular subpleural opacities in the lingula and lateral left  lower lobe are mildly hypermetabolic but nonspecific. Findings do  not appear masslike, but rather fibrotic. Difficult to definitively  exclude malignancy. Continued attention on followup exams is  warranted.  3. Small periumbilical hernia contains fat.  Electronically Signed  By: Lorin Picket M.D.  On: 02/17/2013 14:21  PFT's: 03/28/2013 done today FEV1 0.92 46%, DLCO 11.1 51%   Pulmonary Function Test  Date: 07/20/2009  Height (in.): 64  Gender: Female  Pre-Spirometry  FVC Value: 1.93 L/min Pred: 2.88 L/min % Pred: 67 %  FEV1 Value: 1.31 L Pred: 2.06 L % Pred: 64 %  FEV1/FVC Value: 68 % Pred: 72 % FEF 25-75 Value: 0.86 L/min Pred: 2.35 L/min % Pred: 36 %  Post-Spirometry  FVC Value: 1.82  L/min Pred: 2.88 L/min % Pred: 63 %  FEV1 Value: 1.24 L Pred: 2.06 L % Pred: 60 %  FEV1/FVC Value: 68 % Pred: 72 % FEF 25-75 Value: 0.74 L/min Pred: 2.35 L/min % Pred:  31 %  Lung Volumes  TLC Value: 3.49 L % Pred: 72 %  RV Value: 1.55 L % Pred: 81 %  DLCO Value: 15.4 % % Pred: 58 %  DLCO/VA Value: 5.38 % % Pred: 151 %  Comments:  Mild to mod AFL, no BD response, restricted volumes, DLCO corrects to normal range for Va. RSV    Recent Lab Findings: Lab Results  Component Value Date   WBC 13.2* 03/16/2013   HGB 10.5* 03/16/2013   HCT 31.5* 03/16/2013   PLT 126* 03/16/2013   GLUCOSE 107* 01/27/2013   NA 138 01/27/2013   K 4.4 01/27/2013   CL 103 01/27/2013   CREATININE 0.8 01/27/2013   BUN 16 01/27/2013   CO2 28 01/27/2013   INR 0.99 03/16/2013   PATH: #[id number] [place] [facility] Diagnosis Lung, needle/core biopsy(ies), Left upper lobe - INVASIVE SQUAMOUS CELL CARCINOMA. - SEE COMMENT. Microscopic Comment The malignant cells are positive for cytokeratin 5/6, p63, and cytokeratin 7. They are negative for cytokeratin 20, napsin-A, and TTF-1. This profile supports a diagnosis of squamous cell carcinoma. Enid Cutter MD Pathologist, Electronic Signature (Case signed 03/21/2013)  Assessment / Plan:   Clinical Stage I squamous cell ca of the left upper lobe in patient  With limited pulmonary reserve and on methotrexate and Remicade. After discussion with radiation therapy the patient wishes to proceed with limited pulmonary resection. I've discussed with her in detail the risks and options. With the cavitary lesion which has been increasing in size surgical resection even if it is a limited is reasonable to do. We will plan bronchoscopy left video-assisted thoracoscopy wedge resection and lymph node sampling on Tuesday, April 7.    [name] MD      [address] [place] [address] Office [phone]   Beeper [phone]  04/07/2013 5:01 PM

## 2013-04-08 ENCOUNTER — Encounter (HOSPITAL_COMMUNITY): Payer: Self-pay | Admitting: Pharmacy Technician

## 2013-04-11 ENCOUNTER — Encounter (HOSPITAL_COMMUNITY): Payer: Self-pay

## 2013-04-11 ENCOUNTER — Encounter (HOSPITAL_COMMUNITY)
Admission: RE | Admit: 2013-04-11 | Discharge: 2013-04-11 | Disposition: A | Discharge status: Home / Self Care | Payer: Medicare Other | Source: Ambulatory Visit | Attending: Cardiothoracic Surgery | Admitting: Cardiothoracic Surgery

## 2013-04-11 VITALS — BP 113/75 | HR 86 | Temp 98.1°F | Resp 18 | Ht 62.0 in | Wt 225.0 lb

## 2013-04-11 DIAGNOSIS — E785 Hyperlipidemia, unspecified: Secondary | ICD-10-CM | POA: Diagnosis present

## 2013-04-11 DIAGNOSIS — E669 Obesity, unspecified: Secondary | ICD-10-CM | POA: Diagnosis not present

## 2013-04-11 DIAGNOSIS — J4489 Other specified chronic obstructive pulmonary disease: Secondary | ICD-10-CM | POA: Diagnosis present

## 2013-04-11 DIAGNOSIS — A492 Hemophilus influenzae infection, unspecified site: Secondary | ICD-10-CM | POA: Diagnosis present

## 2013-04-11 DIAGNOSIS — E871 Hypo-osmolality and hyponatremia: Secondary | ICD-10-CM | POA: Diagnosis present

## 2013-04-11 DIAGNOSIS — Z452 Encounter for adjustment and management of vascular access device: Secondary | ICD-10-CM | POA: Diagnosis not present

## 2013-04-11 DIAGNOSIS — Z803 Family history of malignant neoplasm of breast: Secondary | ICD-10-CM | POA: Diagnosis not present

## 2013-04-11 DIAGNOSIS — Z01818 Encounter for other preprocedural examination: Secondary | ICD-10-CM | POA: Insufficient documentation

## 2013-04-11 DIAGNOSIS — J9383 Other pneumothorax: Secondary | ICD-10-CM | POA: Diagnosis not present

## 2013-04-11 DIAGNOSIS — R279 Unspecified lack of coordination: Secondary | ICD-10-CM | POA: Diagnosis not present

## 2013-04-11 DIAGNOSIS — I447 Left bundle-branch block, unspecified: Secondary | ICD-10-CM | POA: Diagnosis present

## 2013-04-11 DIAGNOSIS — Z801 Family history of malignant neoplasm of trachea, bronchus and lung: Secondary | ICD-10-CM | POA: Diagnosis not present

## 2013-04-11 DIAGNOSIS — J449 Chronic obstructive pulmonary disease, unspecified: Secondary | ICD-10-CM | POA: Diagnosis present

## 2013-04-11 DIAGNOSIS — J438 Other emphysema: Secondary | ICD-10-CM | POA: Diagnosis not present

## 2013-04-11 DIAGNOSIS — J9620 Acute and chronic respiratory failure, unspecified whether with hypoxia or hypercapnia: Secondary | ICD-10-CM | POA: Diagnosis not present

## 2013-04-11 DIAGNOSIS — B963 Hemophilus influenzae [H. influenzae] as the cause of diseases classified elsewhere: Secondary | ICD-10-CM | POA: Diagnosis present

## 2013-04-11 DIAGNOSIS — R918 Other nonspecific abnormal finding of lung field: Secondary | ICD-10-CM | POA: Diagnosis not present

## 2013-04-11 DIAGNOSIS — R05 Cough: Secondary | ICD-10-CM | POA: Diagnosis not present

## 2013-04-11 DIAGNOSIS — R059 Cough, unspecified: Secondary | ICD-10-CM | POA: Diagnosis not present

## 2013-04-11 DIAGNOSIS — K219 Gastro-esophageal reflux disease without esophagitis: Secondary | ICD-10-CM | POA: Diagnosis present

## 2013-04-11 DIAGNOSIS — J309 Allergic rhinitis, unspecified: Secondary | ICD-10-CM | POA: Diagnosis present

## 2013-04-11 DIAGNOSIS — R0602 Shortness of breath: Secondary | ICD-10-CM | POA: Diagnosis not present

## 2013-04-11 DIAGNOSIS — Z48813 Encounter for surgical aftercare following surgery on the respiratory system: Secondary | ICD-10-CM | POA: Diagnosis not present

## 2013-04-11 DIAGNOSIS — J90 Pleural effusion, not elsewhere classified: Secondary | ICD-10-CM | POA: Diagnosis not present

## 2013-04-11 DIAGNOSIS — Z8249 Family history of ischemic heart disease and other diseases of the circulatory system: Secondary | ICD-10-CM | POA: Diagnosis not present

## 2013-04-11 DIAGNOSIS — Z87891 Personal history of nicotine dependence: Secondary | ICD-10-CM | POA: Diagnosis not present

## 2013-04-11 DIAGNOSIS — Z01812 Encounter for preprocedural laboratory examination: Secondary | ICD-10-CM | POA: Insufficient documentation

## 2013-04-11 DIAGNOSIS — Z6841 Body Mass Index (BMI) 40.0 and over, adult: Secondary | ICD-10-CM | POA: Diagnosis not present

## 2013-04-11 DIAGNOSIS — M1990 Unspecified osteoarthritis, unspecified site: Secondary | ICD-10-CM | POA: Diagnosis present

## 2013-04-11 DIAGNOSIS — M6281 Muscle weakness (generalized): Secondary | ICD-10-CM | POA: Diagnosis not present

## 2013-04-11 DIAGNOSIS — R269 Unspecified abnormalities of gait and mobility: Secondary | ICD-10-CM | POA: Diagnosis not present

## 2013-04-11 DIAGNOSIS — C3490 Malignant neoplasm of unspecified part of unspecified bronchus or lung: Secondary | ICD-10-CM

## 2013-04-11 DIAGNOSIS — J9819 Other pulmonary collapse: Secondary | ICD-10-CM | POA: Diagnosis not present

## 2013-04-11 DIAGNOSIS — I279 Pulmonary heart disease, unspecified: Secondary | ICD-10-CM | POA: Diagnosis not present

## 2013-04-11 DIAGNOSIS — D649 Anemia, unspecified: Secondary | ICD-10-CM | POA: Diagnosis not present

## 2013-04-11 DIAGNOSIS — G4733 Obstructive sleep apnea (adult) (pediatric): Secondary | ICD-10-CM | POA: Diagnosis present

## 2013-04-11 DIAGNOSIS — Z01810 Encounter for preprocedural cardiovascular examination: Secondary | ICD-10-CM

## 2013-04-11 DIAGNOSIS — L259 Unspecified contact dermatitis, unspecified cause: Secondary | ICD-10-CM | POA: Diagnosis present

## 2013-04-11 DIAGNOSIS — M069 Rheumatoid arthritis, unspecified: Secondary | ICD-10-CM | POA: Diagnosis not present

## 2013-04-11 DIAGNOSIS — J9382 Other air leak: Secondary | ICD-10-CM | POA: Diagnosis not present

## 2013-04-11 DIAGNOSIS — D62 Acute posthemorrhagic anemia: Secondary | ICD-10-CM | POA: Diagnosis present

## 2013-04-11 DIAGNOSIS — C3410 Malignant neoplasm of upper lobe, unspecified bronchus or lung: Secondary | ICD-10-CM | POA: Diagnosis not present

## 2013-04-11 DIAGNOSIS — M810 Age-related osteoporosis without current pathological fracture: Secondary | ICD-10-CM | POA: Diagnosis not present

## 2013-04-11 DIAGNOSIS — R5381 Other malaise: Secondary | ICD-10-CM | POA: Diagnosis not present

## 2013-04-11 DIAGNOSIS — I10 Essential (primary) hypertension: Secondary | ICD-10-CM | POA: Diagnosis not present

## 2013-04-11 DIAGNOSIS — Z880 Allergy status to penicillin: Secondary | ICD-10-CM | POA: Diagnosis not present

## 2013-04-11 DIAGNOSIS — I2789 Other specified pulmonary heart diseases: Secondary | ICD-10-CM | POA: Diagnosis not present

## 2013-04-11 DIAGNOSIS — Z8542 Personal history of malignant neoplasm of other parts of uterus: Secondary | ICD-10-CM | POA: Diagnosis not present

## 2013-04-11 HISTORY — DX: Left bundle-branch block, unspecified: I44.7

## 2013-04-11 HISTORY — DX: Gastro-esophageal reflux disease without esophagitis: K21.9

## 2013-04-11 LAB — SURGICAL PCR SCREEN
MRSA, PCR: NEGATIVE
Staphylococcus aureus: NEGATIVE

## 2013-04-11 LAB — BLOOD GAS, ARTERIAL
Acid-Base Excess: 0.2 mmol/L (ref 0.0–2.0)
Bicarbonate: 23.9 mEq/L (ref 20.0–24.0)
Drawn by: 344381
FIO2: 0.21 %
O2 Saturation: 95.3 %
Patient temperature: 98.6
TCO2: 24.9 mmol/L (ref 0–100)
pCO2 arterial: 35.4 mmHg (ref 35.0–45.0)
pH, Arterial: 7.443 (ref 7.350–7.450)
pO2, Arterial: 74 mmHg — ABNORMAL LOW (ref 80.0–100.0)

## 2013-04-11 LAB — CBC
HCT: 35.6 % — ABNORMAL LOW (ref 36.0–46.0)
Hemoglobin: 11.8 g/dL — ABNORMAL LOW (ref 12.0–15.0)
MCH: 30.8 pg (ref 26.0–34.0)
MCHC: 33.1 g/dL (ref 30.0–36.0)
MCV: 93 fL (ref 78.0–100.0)
Platelets: 268 10*3/uL (ref 150–400)
RBC: 3.83 MIL/uL — ABNORMAL LOW (ref 3.87–5.11)
RDW: 14.7 % (ref 11.5–15.5)
WBC: 11.5 10*3/uL — ABNORMAL HIGH (ref 4.0–10.5)

## 2013-04-11 LAB — URINALYSIS, ROUTINE W REFLEX MICROSCOPIC
Bilirubin Urine: NEGATIVE
Glucose, UA: NEGATIVE mg/dL
Hgb urine dipstick: NEGATIVE
Ketones, ur: NEGATIVE mg/dL
Nitrite: NEGATIVE
Protein, ur: NEGATIVE mg/dL
Specific Gravity, Urine: 1.025 (ref 1.005–1.030)
Urobilinogen, UA: 0.2 mg/dL (ref 0.0–1.0)
pH: 5.5 (ref 5.0–8.0)

## 2013-04-11 LAB — COMPREHENSIVE METABOLIC PANEL
ALT: 9 U/L (ref 0–35)
AST: 24 U/L (ref 0–37)
Albumin: 3.3 g/dL — ABNORMAL LOW (ref 3.5–5.2)
Alkaline Phosphatase: 57 U/L (ref 39–117)
BUN: 18 mg/dL (ref 6–23)
CO2: 18 mEq/L — ABNORMAL LOW (ref 19–32)
Calcium: 9.4 mg/dL (ref 8.4–10.5)
Chloride: 102 mEq/L (ref 96–112)
Creatinine, Ser: 0.6 mg/dL (ref 0.50–1.10)
GFR calc Af Amer: >90 mL/min (ref >90)
GFR calc non Af Amer: 89 mL/min — ABNORMAL LOW (ref >90)
Glucose, Bld: 114 mg/dL — ABNORMAL HIGH (ref 70–99)
Potassium: 4.4 mEq/L (ref 3.7–5.3)
Sodium: 140 mEq/L (ref 137–147)
Total Bilirubin: 0.3 mg/dL (ref 0.3–1.2)
Total Protein: 7.8 g/dL (ref 6.0–8.3)

## 2013-04-11 LAB — TYPE AND SCREEN
ABO/RH(D): A POS
Antibody Screen: NEGATIVE

## 2013-04-11 LAB — URINE MICROSCOPIC-ADD ON

## 2013-04-11 LAB — APTT: aPTT: 24 seconds (ref 24–37)

## 2013-04-11 LAB — ABO/RH: ABO/RH(D): A POS

## 2013-04-11 LAB — PROTIME-INR
INR: 0.91 (ref 0.00–1.49)
Prothrombin Time: 12.1 seconds (ref 11.6–15.2)

## 2013-04-11 MED ORDER — VANCOMYCIN HCL 10 G IV SOLR
1500.0000 mg | INTRAVENOUS | Status: AC
  Administered 2013-04-12: 1500 mg via INTRAVENOUS
  Filled 2013-04-11: qty 1500

## 2013-04-11 NOTE — Pre-Procedure Instructions (Addendum)
Taylor Berry  04/11/2013   Your procedure is scheduled on:  Tuesday, April 7.  Report to [facility], Main Entrance [facility] "A" at 5:30AM.  Call this number if you have problems the morning of surgery: [phone]   Remember:   Do not eat food or drink liquids after midnight.   Take these medicines the morning of surgery with A SIP OF WATER: cetirizine (ZYRTEC), omeprazole (PRILOSEC),venlafaxine (EFFEXOR-XR).              May use inhalers and nasal spray.  Please bring albuterol (VENTOLIN HFA) inhaler to the [facility] with you.   Do not wear jewelry, make-up or nail polish.  Do not wear lotions, powders, or perfumes. You may wear deodorant.  Do not shave 48 hours prior to surgery.   Do not bring valuables to the [facility].  [facility] is not responsible for any belongings or valuables.               Contacts, dentures or bridgework may not be worn into surgery.  Leave suitcase in the car. After surgery it may be brought to your room.  For patients admitted to the [facility], discharge time is determined by your treatment team.             Special Instructions: -[name] - Preparing for Surgery  Before surgery, you can play an important role.  Because skin is not sterile, your skin needs to be as free of germs as possible.  You can reduce the number of germs on you skin by washing with CHG (chlorahexidine gluconate) soap before surgery.  CHG is an antiseptic cleaner which kills germs and bonds with the skin to continue killing germs even after washing.  Please DO NOT use if you have an allergy to CHG or antibacterial soaps.  If your skin becomes reddened/irritated stop using the CHG and inform your nurse when you arrive at Short Stay.  Do not shave (including legs and underarms) for at least 48 hours prior to the first CHG shower.  You may shave your face.  Please follow these instructions carefully:   1.  Shower with CHG Soap the night before surgery and the                                 morning of Surgery.  2.  If you choose to wash your hair, wash your hair first as usual with your       normal shampoo.  3.  After you shampoo, rinse your hair and body thoroughly to remove the                      Shampoo.  4.  Use CHG as you would any other liquid soap.  You can apply chg directly       to the skin and wash gently with scrungie or a clean washcloth.  5.  Apply the CHG Soap to your body ONLY FROM THE NECK DOWN.        Do not use on open wounds or open sores.  Avoid contact with your eyes,       ears, mouth and genitals (private parts).  Wash genitals (private parts)       with your normal soap.  6.  Wash thoroughly, paying special attention to the area where your surgery        will be performed.  7.  Thoroughly rinse your body with warm water from the neck down.  8.  DO NOT shower/wash with your normal soap after using and rinsing off       the CHG Soap.  9.  Pat yourself dry with a clean towel.            10.  Wear clean pajamas.            11.  Place clean sheets on your bed the night of your first shower and do not        sleep with pets.  Day of Surgery  Do not apply any lotions/deoderants the morning of surgery.  Please wear clean clothes to the [facility]surgery center.      Please read over the following fact sheets that you were given: Pain Booklet, Coughing and Deep Breathing, Blood Transfusion Information and Surgical Site Infection Prevention

## 2013-04-11 NOTE — Progress Notes (Signed)
Seizure chart review: Patient is a 73 year old female scheduled for bronchoscopy, left VATS, lung resection tomorrow by Dr. Servando Snare.  She was recently diagnosed with SCC of the lung. Limited resection was recommended due to her limited pulmonary reserve. Her PAT appointment was at 2 PM and chart was given to me to review after 4 PM today.  History includes former smoker, hypertension, rheumatoid arthritis, osteoporosis, hyperlipidemia, obstructive sleep apnea, COPD, uterine cancer status post hysterectomy, GERD, tonsillectomy.  BMI is 41 consistent with obesity. PCP is Dr. Kelton Pillar. She is not routinely followed by cardiology, but saw Dr. Stanford Breed in 2012 for dyspnea.  She had an abnormal stress but only minor CAD by 01/2011 cath. She had a left BBB at that time. Puimonologist is Dr. Lamonte Sakai.  Cardiac cath on 01/27/11 showed: 1. Minor nonobstructive coronary artery disease without significant stenosis.  2. Normal left ventricle systolic function, EF 30-16%. 3. Elevated right heart pressures with mild pulmonary hypertension.   Echo on 12/26/10 showed: - Left ventricle: The cavity size was normal. Wall thickness was increased in a pattern of moderate LVH. Systolic function was normal. The estimated ejection fraction was in the range of 50% to 55%. Wall motion was normal; there were no regional wall motion abnormalities. Doppler parameters are consistent with abnormal left ventricular relaxation (grade 1 diastolic dysfunction). - Ventricular septum: Septal motion showed paradox. These changes are consistent with a left bundle branch block. - Left atrium: The atrium was mildly dilated. - Pulmonary arteries: Systolic pressure was mildly increased. PA peak pressure: 88mm Hg (S). Impressions: EF difficult to quantitate due to LBBB and abnormal septal motion but appears to be low normal.  EKG on 04/11/13 showed NSR, LAD, incomplete BBB (QRS 0.12), septal infarct (age undetermined), ST/T wave  abnormality, consider lateral ischemia. QRS interval is slightly decreased, lateral ST abnormality is slightly increased when compared to prior EKG on 2/11/3.  Preoperative CXR, PFTs, and labs noted.  She has a known left BBB since at least 2012 and had minimal CAD by cath at that time.  EF was NL then.  Further evaluation by her assigned anesthesiologist on the day of surgery.  If no acute CV symptoms then I would anticipate that she could proceed as planned.  George Hugh [facility] Short Stay Center/Anesthesiology Phone [phone] 04/11/2013 5:00 PM

## 2013-04-12 ENCOUNTER — Inpatient Hospital Stay (HOSPITAL_COMMUNITY): Payer: Medicare Other

## 2013-04-12 ENCOUNTER — Inpatient Hospital Stay (HOSPITAL_COMMUNITY): Payer: Medicare Other | Admitting: Anesthesiology

## 2013-04-12 ENCOUNTER — Encounter (HOSPITAL_COMMUNITY)
Admission: RE | Disposition: A | Discharge status: Skilled Nursing Facility | Payer: Self-pay | Source: Ambulatory Visit | Attending: Cardiothoracic Surgery

## 2013-04-12 ENCOUNTER — Encounter (HOSPITAL_COMMUNITY): Payer: Medicare Other | Admitting: Vascular Surgery

## 2013-04-12 ENCOUNTER — Encounter (HOSPITAL_COMMUNITY): Payer: Self-pay | Admitting: *Deleted

## 2013-04-12 ENCOUNTER — Inpatient Hospital Stay (HOSPITAL_COMMUNITY)
Admission: RE | Admit: 2013-04-12 | Discharge: 2013-04-28 | DRG: 163 | Disposition: A | Discharge status: Skilled Nursing Facility | Payer: Medicare Other | Source: Ambulatory Visit | Attending: Cardiothoracic Surgery | Admitting: Cardiothoracic Surgery

## 2013-04-12 DIAGNOSIS — Z8542 Personal history of malignant neoplasm of other parts of uterus: Secondary | ICD-10-CM

## 2013-04-12 DIAGNOSIS — E871 Hypo-osmolality and hyponatremia: Secondary | ICD-10-CM | POA: Diagnosis present

## 2013-04-12 DIAGNOSIS — I447 Left bundle-branch block, unspecified: Secondary | ICD-10-CM | POA: Diagnosis present

## 2013-04-12 DIAGNOSIS — Z87891 Personal history of nicotine dependence: Secondary | ICD-10-CM

## 2013-04-12 DIAGNOSIS — M1990 Unspecified osteoarthritis, unspecified site: Secondary | ICD-10-CM | POA: Diagnosis present

## 2013-04-12 DIAGNOSIS — Z803 Family history of malignant neoplasm of breast: Secondary | ICD-10-CM

## 2013-04-12 DIAGNOSIS — B379 Candidiasis, unspecified: Secondary | ICD-10-CM | POA: Diagnosis not present

## 2013-04-12 DIAGNOSIS — M810 Age-related osteoporosis without current pathological fracture: Secondary | ICD-10-CM | POA: Diagnosis present

## 2013-04-12 DIAGNOSIS — R918 Other nonspecific abnormal finding of lung field: Secondary | ICD-10-CM

## 2013-04-12 DIAGNOSIS — R059 Cough, unspecified: Secondary | ICD-10-CM

## 2013-04-12 DIAGNOSIS — R5381 Other malaise: Secondary | ICD-10-CM | POA: Diagnosis present

## 2013-04-12 DIAGNOSIS — J9382 Other air leak: Secondary | ICD-10-CM | POA: Diagnosis present

## 2013-04-12 DIAGNOSIS — I2789 Other specified pulmonary heart diseases: Secondary | ICD-10-CM | POA: Diagnosis present

## 2013-04-12 DIAGNOSIS — I10 Essential (primary) hypertension: Secondary | ICD-10-CM | POA: Diagnosis present

## 2013-04-12 DIAGNOSIS — R05 Cough: Secondary | ICD-10-CM

## 2013-04-12 DIAGNOSIS — D069 Carcinoma in situ of cervix, unspecified: Secondary | ICD-10-CM | POA: Diagnosis present

## 2013-04-12 DIAGNOSIS — C3492 Malignant neoplasm of unspecified part of left bronchus or lung: Secondary | ICD-10-CM | POA: Diagnosis present

## 2013-04-12 DIAGNOSIS — Z6841 Body Mass Index (BMI) 40.0 and over, adult: Secondary | ICD-10-CM

## 2013-04-12 DIAGNOSIS — A492 Hemophilus influenzae infection, unspecified site: Secondary | ICD-10-CM | POA: Diagnosis present

## 2013-04-12 DIAGNOSIS — C3490 Malignant neoplasm of unspecified part of unspecified bronchus or lung: Secondary | ICD-10-CM

## 2013-04-12 DIAGNOSIS — J9620 Acute and chronic respiratory failure, unspecified whether with hypoxia or hypercapnia: Secondary | ICD-10-CM | POA: Diagnosis present

## 2013-04-12 DIAGNOSIS — L259 Unspecified contact dermatitis, unspecified cause: Secondary | ICD-10-CM | POA: Diagnosis present

## 2013-04-12 DIAGNOSIS — J309 Allergic rhinitis, unspecified: Secondary | ICD-10-CM | POA: Diagnosis present

## 2013-04-12 DIAGNOSIS — Z9119 Patient's noncompliance with other medical treatment and regimen: Secondary | ICD-10-CM

## 2013-04-12 DIAGNOSIS — D62 Acute posthemorrhagic anemia: Secondary | ICD-10-CM | POA: Diagnosis present

## 2013-04-12 DIAGNOSIS — Z888 Allergy status to other drugs, medicaments and biological substances status: Secondary | ICD-10-CM

## 2013-04-12 DIAGNOSIS — E785 Hyperlipidemia, unspecified: Secondary | ICD-10-CM | POA: Diagnosis present

## 2013-04-12 DIAGNOSIS — J449 Chronic obstructive pulmonary disease, unspecified: Secondary | ICD-10-CM | POA: Diagnosis present

## 2013-04-12 DIAGNOSIS — T8140XA Infection following a procedure, unspecified, initial encounter: Secondary | ICD-10-CM

## 2013-04-12 DIAGNOSIS — B963 Hemophilus influenzae [H. influenzae] as the cause of diseases classified elsewhere: Secondary | ICD-10-CM | POA: Diagnosis present

## 2013-04-12 DIAGNOSIS — Z8249 Family history of ischemic heart disease and other diseases of the circulatory system: Secondary | ICD-10-CM

## 2013-04-12 DIAGNOSIS — M069 Rheumatoid arthritis, unspecified: Secondary | ICD-10-CM | POA: Diagnosis present

## 2013-04-12 DIAGNOSIS — C3410 Malignant neoplasm of upper lobe, unspecified bronchus or lung: Principal | ICD-10-CM | POA: Diagnosis present

## 2013-04-12 DIAGNOSIS — Z880 Allergy status to penicillin: Secondary | ICD-10-CM

## 2013-04-12 DIAGNOSIS — I272 Pulmonary hypertension, unspecified: Secondary | ICD-10-CM | POA: Diagnosis present

## 2013-04-12 DIAGNOSIS — G4733 Obstructive sleep apnea (adult) (pediatric): Secondary | ICD-10-CM | POA: Diagnosis present

## 2013-04-12 DIAGNOSIS — E669 Obesity, unspecified: Secondary | ICD-10-CM

## 2013-04-12 DIAGNOSIS — Z91199 Patient's noncompliance with other medical treatment and regimen due to unspecified reason: Secondary | ICD-10-CM

## 2013-04-12 DIAGNOSIS — J4489 Other specified chronic obstructive pulmonary disease: Secondary | ICD-10-CM | POA: Diagnosis present

## 2013-04-12 DIAGNOSIS — K219 Gastro-esophageal reflux disease without esophagitis: Secondary | ICD-10-CM | POA: Diagnosis present

## 2013-04-12 DIAGNOSIS — J9600 Acute respiratory failure, unspecified whether with hypoxia or hypercapnia: Secondary | ICD-10-CM

## 2013-04-12 DIAGNOSIS — Z801 Family history of malignant neoplasm of trachea, bronchus and lung: Secondary | ICD-10-CM

## 2013-04-12 DIAGNOSIS — R0600 Dyspnea, unspecified: Secondary | ICD-10-CM | POA: Diagnosis present

## 2013-04-12 DIAGNOSIS — R0609 Other forms of dyspnea: Secondary | ICD-10-CM | POA: Diagnosis present

## 2013-04-12 HISTORY — PX: LYMPH NODE DISSECTION: SHX5087

## 2013-04-12 HISTORY — PX: VIDEO BRONCHOSCOPY: SHX5072

## 2013-04-12 HISTORY — PX: VIDEO ASSISTED THORACOSCOPY (VATS)/THOROCOTOMY: SHX6173

## 2013-04-12 SURGERY — BRONCHOSCOPY, VIDEO-ASSISTED
Anesthesia: General | Site: Chest

## 2013-04-12 MED ORDER — HEMOSTATIC AGENTS (NO CHARGE) OPTIME
TOPICAL | Status: DC | PRN
  Administered 2013-04-12: 1 via TOPICAL

## 2013-04-12 MED ORDER — FENTANYL 10 MCG/ML IV SOLN
INTRAVENOUS | Status: DC
  Administered 2013-04-12: 10 ug via INTRAVENOUS
  Administered 2013-04-12: 12:00:00 via INTRAVENOUS
  Administered 2013-04-13 (×4): 20 ug via INTRAVENOUS
  Administered 2013-04-13: 50 ug via INTRAVENOUS
  Administered 2013-04-13: 30 ug via INTRAVENOUS
  Administered 2013-04-14 (×2): 20 ug via INTRAVENOUS
  Administered 2013-04-14: 50 ug via INTRAVENOUS
  Administered 2013-04-14 – 2013-04-15 (×3): 10 ug via INTRAVENOUS
  Filled 2013-04-12: qty 50

## 2013-04-12 MED ORDER — TIOTROPIUM BROMIDE MONOHYDRATE 18 MCG IN CAPS
18.0000 ug | ORAL_CAPSULE | Freq: Every day | RESPIRATORY_TRACT | Status: DC
Start: 2013-04-12 — End: 2013-04-22
  Administered 2013-04-13 – 2013-04-22 (×9): 18 ug via RESPIRATORY_TRACT
  Filled 2013-04-12 (×3): qty 5

## 2013-04-12 MED ORDER — SODIUM CHLORIDE 0.9 % IJ SOLN
INTRAMUSCULAR | Status: AC
  Filled 2013-04-12: qty 10

## 2013-04-12 MED ORDER — PANTOPRAZOLE SODIUM 40 MG PO TBEC
80.0000 mg | DELAYED_RELEASE_TABLET | Freq: Every day | ORAL | Status: DC
  Administered 2013-04-13 – 2013-04-28 (×16): 80 mg via ORAL
  Filled 2013-04-12 (×17): qty 2

## 2013-04-12 MED ORDER — 0.9 % SODIUM CHLORIDE (POUR BTL) OPTIME
TOPICAL | Status: DC | PRN
  Administered 2013-04-12: 2000 mL

## 2013-04-12 MED ORDER — HYDROMORPHONE HCL PF 1 MG/ML IJ SOLN
0.2500 mg | INTRAMUSCULAR | Status: DC | PRN
  Administered 2013-04-12 (×2): 0.5 mg via INTRAVENOUS

## 2013-04-12 MED ORDER — SENNOSIDES-DOCUSATE SODIUM 8.6-50 MG PO TABS
1.0000 | ORAL_TABLET | Freq: Every evening | ORAL | Status: DC | PRN
  Filled 2013-04-12: qty 1

## 2013-04-12 MED ORDER — ONDANSETRON HCL 4 MG/2ML IJ SOLN
INTRAMUSCULAR | Status: AC
  Filled 2013-04-12: qty 2

## 2013-04-12 MED ORDER — EPHEDRINE SULFATE 50 MG/ML IJ SOLN
INTRAMUSCULAR | Status: DC | PRN
  Administered 2013-04-12 (×2): 10 mg via INTRAVENOUS

## 2013-04-12 MED ORDER — FENTANYL CITRATE 0.05 MG/ML IJ SOLN
INTRAMUSCULAR | Status: DC | PRN
  Administered 2013-04-12 (×4): 50 ug via INTRAVENOUS
  Administered 2013-04-12: 150 ug via INTRAVENOUS
  Administered 2013-04-12: 50 ug via INTRAVENOUS

## 2013-04-12 MED ORDER — LACTATED RINGERS IV SOLN
INTRAVENOUS | Status: DC | PRN
  Administered 2013-04-12 (×2): via INTRAVENOUS

## 2013-04-12 MED ORDER — ALBUTEROL SULFATE HFA 108 (90 BASE) MCG/ACT IN AERS: 2.0000 | INHALATION_SPRAY | Freq: Four times a day (QID) | RESPIRATORY_TRACT | Status: DC | PRN

## 2013-04-12 MED ORDER — FLUTICASONE PROPIONATE 50 MCG/ACT NA SUSP
2.0000 | Freq: Two times a day (BID) | NASAL | Status: DC | PRN
  Filled 2013-04-12: qty 16

## 2013-04-12 MED ORDER — SODIUM CHLORIDE 0.9 % IJ SOLN
9.0000 mL | INTRAMUSCULAR | Status: DC | PRN
  Administered 2013-04-13 – 2013-04-14 (×2): via INTRAVENOUS

## 2013-04-12 MED ORDER — DIPHENHYDRAMINE HCL 50 MG/ML IJ SOLN: 12.5000 mg | Freq: Four times a day (QID) | INTRAMUSCULAR | Status: DC | PRN

## 2013-04-12 MED ORDER — ASPIRIN EC 81 MG PO TBEC
81.0000 mg | DELAYED_RELEASE_TABLET | Freq: Every day | ORAL | Status: DC
  Administered 2013-04-13 – 2013-04-28 (×16): 81 mg via ORAL
  Filled 2013-04-12 (×16): qty 1

## 2013-04-12 MED ORDER — FENTANYL CITRATE 0.05 MG/ML IJ SOLN
INTRAMUSCULAR | Status: AC
  Filled 2013-04-12: qty 5

## 2013-04-12 MED ORDER — EPHEDRINE SULFATE 50 MG/ML IJ SOLN
INTRAMUSCULAR | Status: AC
  Filled 2013-04-12: qty 1

## 2013-04-12 MED ORDER — GLYCOPYRROLATE 0.2 MG/ML IJ SOLN
INTRAMUSCULAR | Status: DC | PRN
Start: 2013-04-12 — End: 2013-04-12
  Administered 2013-04-12: .8 mg via INTRAVENOUS

## 2013-04-12 MED ORDER — VENLAFAXINE HCL ER 75 MG PO CP24
75.0000 mg | ORAL_CAPSULE | Freq: Every day | ORAL | Status: DC
  Administered 2013-04-13 – 2013-04-28 (×16): 75 mg via ORAL
  Filled 2013-04-12 (×17): qty 1

## 2013-04-12 MED ORDER — LEVALBUTEROL HCL 0.63 MG/3ML IN NEBU
0.6300 mg | INHALATION_SOLUTION | Freq: Four times a day (QID) | RESPIRATORY_TRACT | Status: DC
  Administered 2013-04-12 – 2013-04-14 (×9): 0.63 mg via RESPIRATORY_TRACT
  Filled 2013-04-12 (×19): qty 3

## 2013-04-12 MED ORDER — ROCURONIUM BROMIDE 50 MG/5ML IV SOLN
INTRAVENOUS | Status: AC
  Filled 2013-04-12: qty 1

## 2013-04-12 MED ORDER — FOLIC ACID 1 MG PO TABS
1.0000 mg | ORAL_TABLET | Freq: Every day | ORAL | Status: DC
  Administered 2013-04-13 – 2013-04-28 (×16): 1 mg via ORAL
  Filled 2013-04-12 (×17): qty 1

## 2013-04-12 MED ORDER — ACETAMINOPHEN 160 MG/5ML PO SOLN: 1000.0000 mg | Freq: Four times a day (QID) | ORAL | Status: AC

## 2013-04-12 MED ORDER — OXYCODONE HCL 5 MG/5ML PO SOLN: 5.0000 mg | Freq: Once | ORAL | Status: DC | PRN

## 2013-04-12 MED ORDER — NEOSTIGMINE METHYLSULFATE 1 MG/ML IJ SOLN
INTRAMUSCULAR | Status: DC | PRN
  Administered 2013-04-12: 4 mg via INTRAVENOUS

## 2013-04-12 MED ORDER — LACTATED RINGERS IV SOLN
INTRAVENOUS | Status: DC | PRN
  Administered 2013-04-12: 07:00:00 via INTRAVENOUS

## 2013-04-12 MED ORDER — SODIUM CHLORIDE 0.9 % IV SOLN
10.0000 mg | INTRAVENOUS | Status: DC | PRN
  Administered 2013-04-12: 25 ug/min via INTRAVENOUS

## 2013-04-12 MED ORDER — FAMOTIDINE 20 MG PO TABS
20.0000 mg | ORAL_TABLET | Freq: Every day | ORAL | Status: DC
  Administered 2013-04-12 – 2013-04-27 (×16): 20 mg via ORAL
  Filled 2013-04-12 (×19): qty 1

## 2013-04-12 MED ORDER — VITAMIN C 250 MG PO TABS
250.0000 mg | ORAL_TABLET | Freq: Every day | ORAL | Status: DC
  Administered 2013-04-13 – 2013-04-28 (×16): 250 mg via ORAL
  Filled 2013-04-12 (×19): qty 1

## 2013-04-12 MED ORDER — SIMVASTATIN 20 MG PO TABS
20.0000 mg | ORAL_TABLET | Freq: Every day | ORAL | Status: DC
  Administered 2013-04-12 – 2013-04-27 (×16): 20 mg via ORAL
  Filled 2013-04-12 (×17): qty 1

## 2013-04-12 MED ORDER — LORATADINE 10 MG PO TABS
10.0000 mg | ORAL_TABLET | Freq: Every day | ORAL | Status: DC
  Administered 2013-04-13 – 2013-04-28 (×16): 10 mg via ORAL
  Filled 2013-04-12 (×16): qty 1

## 2013-04-12 MED ORDER — OXYCODONE HCL 5 MG PO TABS: 5.0000 mg | ORAL_TABLET | ORAL | Status: AC | PRN

## 2013-04-12 MED ORDER — IRBESARTAN 150 MG PO TABS
150.0000 mg | ORAL_TABLET | Freq: Every day | ORAL | Status: DC
  Administered 2013-04-13 – 2013-04-28 (×16): 150 mg via ORAL
  Filled 2013-04-12 (×16): qty 1

## 2013-04-12 MED ORDER — MIDAZOLAM HCL 5 MG/5ML IJ SOLN
INTRAMUSCULAR | Status: DC | PRN
  Administered 2013-04-12 (×2): 1 mg via INTRAVENOUS

## 2013-04-12 MED ORDER — NEOSTIGMINE METHYLSULFATE 1 MG/ML IJ SOLN
INTRAMUSCULAR | Status: AC
  Filled 2013-04-12: qty 10

## 2013-04-12 MED ORDER — ALBUTEROL SULFATE (2.5 MG/3ML) 0.083% IN NEBU: 2.5000 mg | INHALATION_SOLUTION | Freq: Four times a day (QID) | RESPIRATORY_TRACT | Status: DC | PRN

## 2013-04-12 MED ORDER — HYDROMORPHONE HCL PF 1 MG/ML IJ SOLN
INTRAMUSCULAR | Status: AC
  Administered 2013-04-12: 0.5 mg via INTRAVENOUS
  Filled 2013-04-12: qty 1

## 2013-04-12 MED ORDER — PROPOFOL 10 MG/ML IV BOLUS
INTRAVENOUS | Status: DC | PRN
  Administered 2013-04-12: 125 mg via INTRAVENOUS

## 2013-04-12 MED ORDER — PROPOFOL 10 MG/ML IV BOLUS
INTRAVENOUS | Status: AC
  Filled 2013-04-12: qty 20

## 2013-04-12 MED ORDER — KCL IN DEXTROSE-NACL 10-5-0.45 MEQ/L-%-% IV SOLN
INTRAVENOUS | Status: DC
  Administered 2013-04-12: 100 mL/h via INTRAVENOUS
  Administered 2013-04-13 – 2013-04-14 (×3): via INTRAVENOUS
  Filled 2013-04-12 (×10): qty 1000

## 2013-04-12 MED ORDER — ONDANSETRON HCL 4 MG/2ML IJ SOLN: 4.0000 mg | Freq: Four times a day (QID) | INTRAMUSCULAR | Status: DC | PRN

## 2013-04-12 MED ORDER — OXYCODONE HCL 5 MG PO TABS: 5.0000 mg | ORAL_TABLET | Freq: Once | ORAL | Status: DC | PRN

## 2013-04-12 MED ORDER — ROCURONIUM BROMIDE 100 MG/10ML IV SOLN
INTRAVENOUS | Status: DC | PRN
  Administered 2013-04-12: 50 mg via INTRAVENOUS
  Administered 2013-04-12: 25 mg via INTRAVENOUS
  Administered 2013-04-12: 5 mg via INTRAVENOUS

## 2013-04-12 MED ORDER — NALOXONE HCL 0.4 MG/ML IJ SOLN: 0.4000 mg | INTRAMUSCULAR | Status: DC | PRN

## 2013-04-12 MED ORDER — GLYCOPYRROLATE 0.2 MG/ML IJ SOLN
INTRAMUSCULAR | Status: AC
  Filled 2013-04-12: qty 4

## 2013-04-12 MED ORDER — BISACODYL 5 MG PO TBEC
10.0000 mg | DELAYED_RELEASE_TABLET | Freq: Every day | ORAL | Status: DC
  Administered 2013-04-13 – 2013-04-28 (×13): 10 mg via ORAL
  Filled 2013-04-12 (×15): qty 2

## 2013-04-12 MED ORDER — POTASSIUM CHLORIDE 10 MEQ/50ML IV SOLN: 10.0000 meq | Freq: Every day | INTRAVENOUS | Status: DC | PRN

## 2013-04-12 MED ORDER — ONDANSETRON HCL 4 MG/2ML IJ SOLN
INTRAMUSCULAR | Status: DC | PRN
  Administered 2013-04-12: 4 mg via INTRAVENOUS

## 2013-04-12 MED ORDER — OXYCODONE-ACETAMINOPHEN 5-325 MG PO TABS
1.0000 | ORAL_TABLET | ORAL | Status: DC | PRN
  Administered 2013-04-15 (×2): 1 via ORAL
  Administered 2013-04-16: 2 via ORAL
  Administered 2013-04-16: 1 via ORAL
  Filled 2013-04-12 (×2): qty 2
  Filled 2013-04-12: qty 1

## 2013-04-12 MED ORDER — MIDAZOLAM HCL 2 MG/2ML IJ SOLN: 0.5000 mg | Freq: Once | INTRAMUSCULAR | Status: DC | PRN

## 2013-04-12 MED ORDER — ACETAMINOPHEN 500 MG PO TABS
1000.0000 mg | ORAL_TABLET | Freq: Four times a day (QID) | ORAL | Status: AC
  Administered 2013-04-12: 650 mg via ORAL
  Administered 2013-04-13 (×2): 1000 mg via ORAL
  Filled 2013-04-12: qty 2

## 2013-04-12 MED ORDER — CALCIUM CARBONATE-VITAMIN D 500-200 MG-UNIT PO TABS
1.0000 | ORAL_TABLET | ORAL | Status: DC
  Administered 2013-04-14 – 2013-04-28 (×8): 1 via ORAL
  Filled 2013-04-12 (×9): qty 1

## 2013-04-12 MED ORDER — PROMETHAZINE HCL 25 MG/ML IJ SOLN: 6.2500 mg | INTRAMUSCULAR | Status: DC | PRN

## 2013-04-12 MED ORDER — VANCOMYCIN HCL IN DEXTROSE 1-5 GM/200ML-% IV SOLN
1000.0000 mg | Freq: Two times a day (BID) | INTRAVENOUS | Status: AC
  Administered 2013-04-12: 1000 mg via INTRAVENOUS
  Filled 2013-04-12: qty 200

## 2013-04-12 MED ORDER — MEPERIDINE HCL 25 MG/ML IJ SOLN: 6.2500 mg | INTRAMUSCULAR | Status: DC | PRN

## 2013-04-12 MED ORDER — MIDAZOLAM HCL 2 MG/2ML IJ SOLN
INTRAMUSCULAR | Status: AC
  Filled 2013-04-12: qty 2

## 2013-04-12 MED ORDER — ARTIFICIAL TEARS OP OINT
TOPICAL_OINTMENT | OPHTHALMIC | Status: DC | PRN
  Administered 2013-04-12: 1 via OPHTHALMIC

## 2013-04-12 MED ORDER — DIPHENHYDRAMINE HCL 12.5 MG/5ML PO ELIX
12.5000 mg | ORAL_SOLUTION | Freq: Four times a day (QID) | ORAL | Status: DC | PRN
  Filled 2013-04-12: qty 5

## 2013-04-12 SURGICAL SUPPLY — 97 items
ADH SKN CLS APL DERMABOND .7 (GAUZE/BANDAGES/DRESSINGS)
APL SRG 22X2 LUM MLBL SLNT (VASCULAR PRODUCTS)
APL SRG 7X2 LUM MLBL SLNT (VASCULAR PRODUCTS)
APPLICATOR TIP COSEAL (VASCULAR PRODUCTS) IMPLANT
APPLICATOR TIP EXT COSEAL (VASCULAR PRODUCTS) IMPLANT
BIT DRILL 7/64X5 DISP (BIT) ×1 IMPLANT
BLADE SURG 11 STRL SS (BLADE) IMPLANT
BRUSH CYTOL CELLEBRITY 1.5X140 (MISCELLANEOUS) IMPLANT
CANISTER SUCTION 2500CC (MISCELLANEOUS) ×3 IMPLANT
CATH KIT ON Q 5IN SLV (PAIN MANAGEMENT) IMPLANT
CATH THORACIC 28FR (CATHETERS) ×2 IMPLANT
CATH THORACIC 36FR (CATHETERS) IMPLANT
CATH THORACIC 36FR RT ANG (CATHETERS) IMPLANT
CLIP TI MEDIUM 6 (CLIP) ×3 IMPLANT
CONN ST 1/4X3/8  BEN (MISCELLANEOUS)
CONN ST 1/4X3/8 BEN (MISCELLANEOUS) IMPLANT
CONN Y 3/8X3/8X3/8  BEN (MISCELLANEOUS) ×3
CONN Y 3/8X3/8X3/8 BEN (MISCELLANEOUS) IMPLANT
CONT SPEC 4OZ CLIKSEAL STRL BL (MISCELLANEOUS) ×12 IMPLANT
COVER TABLE BACK 60X90 (DRAPES) ×3 IMPLANT
DERMABOND ADVANCED (GAUZE/BANDAGES/DRESSINGS)
DERMABOND ADVANCED .7 DNX12 (GAUZE/BANDAGES/DRESSINGS) IMPLANT
DRAIN CHANNEL 28F RND 3/8 FF (WOUND CARE) IMPLANT
DRAIN CHANNEL 32F RND 10.7 FF (WOUND CARE) IMPLANT
DRAPE LAPAROSCOPIC ABDOMINAL (DRAPES) ×3 IMPLANT
DRAPE SLUSH/WARMER DISC (DRAPES) ×1 IMPLANT
DRAPE WARM FLUID 44X44 (DRAPE) ×2 IMPLANT
DRILL BIT 7/64X5 (BIT) IMPLANT
ELECT BLADE 4.0 EZ CLEAN MEGAD (MISCELLANEOUS) ×3
ELECT REM PT RETURN 9FT ADLT (ELECTROSURGICAL) ×3
ELECTRODE BLDE 4.0 EZ CLN MEGD (MISCELLANEOUS) ×2 IMPLANT
ELECTRODE REM PT RTRN 9FT ADLT (ELECTROSURGICAL) ×2 IMPLANT
FORCEPS BIOP RJ4 1.8 (CUTTING FORCEPS) IMPLANT
GLOVE BIO SURGEON STRL SZ 6.5 (GLOVE) ×4 IMPLANT
GLOVE BIOGEL PI IND STRL 6.5 (GLOVE) IMPLANT
GLOVE BIOGEL PI IND STRL 7.0 (GLOVE) IMPLANT
GLOVE BIOGEL PI INDICATOR 6.5 (GLOVE) ×1
GLOVE BIOGEL PI INDICATOR 7.0 (GLOVE) ×4
GLOVE SURG SS PI 6.0 STRL IVOR (GLOVE) ×3 IMPLANT
GLOVE SURG SS PI 6.5 STRL IVOR (GLOVE) ×4 IMPLANT
GOWN STRL REUS W/ TWL LRG LVL3 (GOWN DISPOSABLE) ×8 IMPLANT
GOWN STRL REUS W/TWL LRG LVL3 (GOWN DISPOSABLE) ×12
HANDLE STAPLE ENDO GIA SHORT (STAPLE) ×1
KIT BASIN OR (CUSTOM PROCEDURE TRAY) ×3 IMPLANT
KIT ROOM TURNOVER OR (KITS) ×3 IMPLANT
KIT SUCTION CATH 14FR (SUCTIONS) ×3 IMPLANT
MARKER SKIN DUAL TIP RULER LAB (MISCELLANEOUS) ×2 IMPLANT
NDL BIOPSY TRANSBRONCH 21G (NEEDLE) IMPLANT
NEEDLE BIOPSY TRANSBRONCH 21G (NEEDLE) IMPLANT
NS IRRIG 1000ML POUR BTL (IV SOLUTION) ×6 IMPLANT
OIL SILICONE PENTAX (PARTS (SERVICE/REPAIRS)) ×3 IMPLANT
PACK CHEST (CUSTOM PROCEDURE TRAY) ×3 IMPLANT
PAD ARMBOARD 7.5X6 YLW CONV (MISCELLANEOUS) ×9 IMPLANT
PASSER SUT SWANSON 36MM LOOP (INSTRUMENTS) ×1 IMPLANT
RELOAD EGIA 45 MED/THCK PURPLE (STAPLE) ×6 IMPLANT
RELOAD EGIA 60 MED/THCK PURPLE (STAPLE) ×6 IMPLANT
RELOAD STAPLE 60 MED/THCK ART (STAPLE) IMPLANT
SCISSORS LAP 5X35 DISP (ENDOMECHANICALS) IMPLANT
SEALANT PROGEL (MISCELLANEOUS) IMPLANT
SEALANT SURG COSEAL 4ML (VASCULAR PRODUCTS) IMPLANT
SEALANT SURG COSEAL 8ML (VASCULAR PRODUCTS) ×1 IMPLANT
SOLUTION ANTI FOG 6CC (MISCELLANEOUS) ×3 IMPLANT
SPONGE GAUZE 4X4 12PLY (GAUZE/BANDAGES/DRESSINGS) ×4 IMPLANT
SPONGE GAUZE 4X4 12PLY STER LF (GAUZE/BANDAGES/DRESSINGS) ×1 IMPLANT
STAPLER ENDO GIA 12 SHRT THIN (STAPLE) IMPLANT
STAPLER ENDO GIA 12MM SHORT (STAPLE) ×2 IMPLANT
SUT PROLENE 3 0 SH DA (SUTURE) IMPLANT
SUT PROLENE 4 0 RB 1 (SUTURE)
SUT PROLENE 4-0 RB1 .5 CRCL 36 (SUTURE) IMPLANT
SUT SILK  1 MH (SUTURE) ×21
SUT SILK 1 MH (SUTURE) ×4 IMPLANT
SUT SILK 2 0 SH (SUTURE) IMPLANT
SUT SILK 2 0SH CR/8 30 (SUTURE) IMPLANT
SUT SILK 3 0SH CR/8 30 (SUTURE) IMPLANT
SUT STEEL 1 (SUTURE) IMPLANT
SUT VIC AB 1 CTX 18 (SUTURE) ×1 IMPLANT
SUT VIC AB 1 CTX 36 (SUTURE)
SUT VIC AB 1 CTX36XBRD ANBCTR (SUTURE) IMPLANT
SUT VIC AB 2-0 CTX 36 (SUTURE) ×1 IMPLANT
SUT VIC AB 2-0 UR6 27 (SUTURE) IMPLANT
SUT VIC AB 3-0 SH 8-18 (SUTURE) ×1 IMPLANT
SUT VIC AB 3-0 X1 27 (SUTURE) ×1 IMPLANT
SUT VICRYL 2 TP 1 (SUTURE) ×1 IMPLANT
SWAB COLLECTION DEVICE MRSA (MISCELLANEOUS) IMPLANT
SYR 20ML ECCENTRIC (SYRINGE) ×3 IMPLANT
SYSTEM SAHARA CHEST DRAIN ATS (WOUND CARE) ×3 IMPLANT
TAPE CLOTH SURG 4X10 WHT LF (GAUZE/BANDAGES/DRESSINGS) ×1 IMPLANT
TIP APPLICATOR SPRAY EXTEND 16 (VASCULAR PRODUCTS) IMPLANT
TOWEL OR 17X24 6PK STRL BLUE (TOWEL DISPOSABLE) ×6 IMPLANT
TOWEL OR 17X26 10 PK STRL BLUE (TOWEL DISPOSABLE) ×6 IMPLANT
TRAP SPECIMEN MUCOUS 40CC (MISCELLANEOUS) ×3 IMPLANT
TRAY FOLEY CATH 16FRSI W/METER (SET/KITS/TRAYS/PACK) ×2 IMPLANT
TRAY FOLEY METER SIL LF 16FR (CATHETERS) ×1 IMPLANT
TUBE ANAEROBIC SPECIMEN COL (MISCELLANEOUS) IMPLANT
TUBE CONNECTING 12X1/4 (SUCTIONS) ×6 IMPLANT
TUNNELER SHEATH ON-Q 11GX8 DSP (PAIN MANAGEMENT) IMPLANT
WATER STERILE IRR 1000ML POUR (IV SOLUTION) ×6 IMPLANT

## 2013-04-12 NOTE — Transfer of Care (Signed)
Immediate Anesthesia Transfer of Care Note  Patient: Taylor Berry  Procedure(s) Performed: Procedure(s): VIDEO BRONCHOSCOPY (N/A) VIDEO ASSISTED THORACOSCOPY (VATS)/THOROCOTOMY, WITH LEFT UPPER LOBE WEDGE RESECTION, CHEST WALL BIOPSY  (Left) LYMPH NODE DISSECTION (Left)  Patient Location: PACU  Anesthesia Type:General  Level of Consciousness: sedated, lethargic and responds to stimulation  Airway & Oxygen Therapy: Patient Spontanous Breathing and Patient connected to face mask oxygen  Post-op Assessment: Report given to PACU RN, Post -op Vital signs reviewed and stable and Patient moving all extremities X 4  Post vital signs: Reviewed and stable  Complications: No apparent anesthesia complications

## 2013-04-12 NOTE — OR Nursing (Signed)
08:29 - End of first procedure.

## 2013-04-12 NOTE — Progress Notes (Signed)
S/p left upper lobectomy  BP 123/64  Pulse 83  Temp(Src) 97.6 F (36.4 C) (Oral)  Resp 13  SpO2 95% on venti mask    Intake/Output Summary (Last 24 hours) at 04/12/13 1819 Last data filed at 04/12/13 1600  Gross per 24 hour  Intake   3100 ml  Output    950 ml  Net   2150 ml   Poor cough  Continue to work on pulmonary hygiene

## 2013-04-12 NOTE — Anesthesia Preprocedure Evaluation (Addendum)
Anesthesia Evaluation  Patient identified by MRN, date of birth, ID band Patient awake    Reviewed: Allergy & Precautions, H&P , NPO status , Patient's Chart, lab work & pertinent test results, reviewed documented beta blocker date and time   History of Anesthesia Complications Negative for: history of anesthetic complications  Airway Mallampati: III TM Distance: >3 FB Neck ROM: full    Dental  (+) Teeth Intact, Dental Advidsory Given   Pulmonary shortness of breath and with exertion, sleep apnea , COPD COPD inhaler, former smoker (quit '90),  breath sounds clear to auscultation  + decreased breath sounds      Cardiovascular hypertension, Pt. on medications - dysrhythmias Rhythm:Regular Rate:Normal  '12 ECHO: EF 50-55%, valves OK   Neuro/Psych    GI/Hepatic Neg liver ROS, GERD-  Medicated and Controlled,  Endo/Other  Morbid obesity  Renal/GU negative Renal ROS   Uterine ca    Musculoskeletal   Abdominal (+) + obese,   Peds  Hematology  (+) anemia ,   Anesthesia Other Findings   Reproductive/Obstetrics                      Anesthesia Physical Anesthesia Plan  ASA: III  Anesthesia Plan: General   Post-op Pain Management:    Induction: Intravenous  Airway Management Planned: Double Lumen EBT  Additional Equipment: Arterial line and CVP  Intra-op Plan:   Post-operative Plan: Extubation in OR  Informed Consent:   Dental Advisory Given  Plan Discussed with: Anesthesiologist, CRNA and Surgeon  Anesthesia Plan Comments: (Plan routine monitors, A line, Central line, GETA with DLT)       Anesthesia Quick Evaluation

## 2013-04-12 NOTE — Brief Op Note (Addendum)
      [address]       [place] [address]             [phone]      04/12/2013  11:40 AM  PATIENT:  Taylor Berry  73 y.o. female  PRE-OPERATIVE DIAGNOSIS:  LUNG CANCER  POST-OPERATIVE DIAGNOSIS:  LUNG CANCER  PROCEDURE:  Procedure(s): VIDEO BRONCHOSCOPY (N/A) VIDEO ASSISTED THORACOSCOPY (VATS)/ MINI THOROCOTOMY -Left Upper Lobe wedge resectionLobectomy -Lymph Node Dissection -Chest Wall Biopsy  SURGEON:  Surgeon(s) and Role:    * [name], MD - Primary  PHYSICIAN ASSISTANT: Erin Barrett PA-C  ANESTHESIA:   general  EBL:  Total I/O In: 2000 [I.V.:2000] Out: 400 [Urine:300; Blood:100]  BLOOD ADMINISTERED:none  DRAINS: 28 Straight Chest Tube Left Chest x2   LOCAL MEDICATIONS USED:  NONE  SPECIMEN:  Source of Specimen:  Left Upper Lobe Wedge, Lymph Nodes, Chest Wall  DISPOSITION OF SPECIMEN:  PATHOLOGY  COUNTS:  YES   DICTATION: .Dragon Dictation  PLAN OF CARE: Admit to inpatient   PATIENT DISPOSITION:  ICU - extubated and stable.   Delay start of Pharmacological VTE agent (>24hrs) due to surgical blood loss or risk of bleeding: yes

## 2013-04-12 NOTE — Anesthesia Procedure Notes (Addendum)
Anesthesia Procedure Note CVP: Timeout, sterile prep, drape, FBP R neck.  Trendelenburg position.  1% lido local, finder and trocar LIJ 1st pass with US guidance.  2 lumen placed over J wire. Biopatch and sterile dressing on.  Patient tolerated well.  VSS.  Jenita Seashore, MD  07:08-07:17 Procedure Name: Intubation Date/Time: 04/12/2013 7:43 AM Performed by: Neldon Newport Pre-anesthesia Checklist: Patient identified, Timeout performed, Emergency Drugs available, Patient being monitored and Suction available Patient Re-evaluated:Patient Re-evaluated prior to inductionOxygen Delivery Method: Circle system utilized Preoxygenation: Pre-oxygenation with 100% oxygen Intubation Type: IV induction Ventilation: Mask ventilation without difficulty and Oral airway inserted - appropriate to patient size Laryngoscope Size: Mac and 3 Grade View: Grade II Tube type: MLT Endobronchial tube: Left, EBT position confirmed by fiberoptic bronchoscope, Double lumen EBT and EBT position confirmed by auscultation and 35 Fr Number of attempts: 4 Airway Equipment and Method: Video-laryngoscopy Placement Confirmation: ETT inserted through vocal cords under direct vision,  positive ETCO2 and breath sounds checked- equal and bilateral Tube secured with: Tape Dental Injury: Teeth and Oropharynx as per pre-operative assessment    Anesthesia Procedure Note CVP: Timeout, sterile prep, drape, FBP R neck.  Trendelenburg position.  1% lido local, finder and trocar LIJ 1st pass with US guidance.  2 lumen placed over J wire. Biopatch and sterile dressing on.  Patient tolerated well.  VSS.  Jenita Seashore, MD  [phone]

## 2013-04-12 NOTE — H&P (Signed)
[address]       [place] [address]             [id number]                        [facility] Record [id number] Date of Birth: [birth year]  Referring: Dr Lamonte Sakai  Primary Care: Osborne Casco, MD  Chief Complaint:  Lung cancer  History of Present Illness:    Taylor Berry 73 y.o. female is seen in the office for 5 year history of cough and history of Hx RA on immunosuppression, OSA, mild AFL with marginal BD response, diastolic dysfxn, borderline secondary PAH. With increased cough even with out URI patient had CT of chest and PET scan. She is referred to consider  Surgical resection of now know squamous cell CA of th lung left upper lobe. First attempt resulted in hemoptysis and the study was not completed. Repeat BX was done in the interval the mass has increased in size and increased cavitation.  Patient is limited by SOB 26mn walk test in office today resulted in cough and sob and o2 sats to 90%   The patient returns to the office today after the completion of her pulmonary function studies was a significant decrease in pulmonary function from her previous studies in 2011. Currently her FEV1 is 0.9 46% of predicted. Diffusion capacity has also dropped to 50% of predicted.  The patient has seen Dr. WPablo Ledgerin radiation oncology for consideration of stereotactic radiotherapy the risks and options of each approach have been discussed with her in detail and she returns today wishing to proceed with surgical resection she is aware that this is an increase risk risk because of her underlying medical problems and limited pulmonary reserve.   Current Activity/ Functional Status:  Patient is independent with mobility/ambulation, transfers, ADL's, IADL's.   Zubrod Score: At the time of surgery this patient's most appropriate activity status/level should be described as: '[]'$     0    Normal activity, no symptoms '[]'$     1     Restricted in physical strenuous activity but ambulatory, able to do out light work '[x]'$     2    Ambulatory and capable of self care, unable to do work activities, up and about               >50 % of waking hours                              '[]'$     3    Only limited self care, in bed greater than 50% of waking hours '[]'$     4    Completely disabled, no self care, confined to bed or chair '[]'$     5    Moribund   Past Medical History  Diagnosis Date  . Hypertension   . Rheumatoid arthritis(714.0)   . Osteoporosis   . Allergic rhinitis   . Hyperlipidemia   . Obesity   . OSA (obstructive sleep apnea)   . Osteoarthritis   . Uterine cancer   . COPD (chronic obstructive pulmonary disease)     cervical cancer  . Shortness of breath   . Cough with expectoration     coughs up phlegm early morning  . GERD (gastroesophageal reflux disease)   . Left bundle branch block  Past Surgical History  Procedure Laterality Date  . Vesicovaginal fistula closure w/ tah  1971  . Tonsillectomy    . Abdominal hysterectomy    . Colonoscopy w/ biopsies and polypectomy    . Cardiac catheterization  01/27/11    minor non-obs CAD, NL EF, mild pulm HTN    Family History  Problem Relation Age of Onset  . Lung cancer Mother  age 99   . Coronary artery disease Son     MI at age 16   half sister deceased from breast ca 38's  History   Social History  . Marital Status: Divorced    Spouse Name: N/A    Number of Children: 2  . Years of Education: N/A   Occupational History  . counselor   .     Social History Main Topics  . Smoking status: Former Smoker -- 1.00 packs/day for 35 years    Types: Cigarettes    Quit date: 01/07/1988  . Smokeless tobacco: Never Used  . Alcohol Use: No     Comment: not since 1988  . Drug Use: No  ..   History  Smoking status  . Former Smoker -- 1.00 packs/day for 35 years  . Types: Cigarettes  . Quit date: 01/07/1988  Smokeless tobacco  . Never Used    History   Alcohol Use No    Comment: not since 1988     Allergies  Allergen Reactions  . Latex     REACTION: itching  . Ace Inhibitors     REACTION: cough  . Hydrochlorothiazide     REACTION: decreased sodium  . Penicillins     REACTION: intolerant due to yeast infection    Current Facility-Administered Medications  Medication Dose Route Frequency Provider Last Rate Last Dose  . vancomycin (VANCOCIN) 1,500 mg in sodium chloride 0.9 % 500 mL IVPB  1,500 mg Intravenous On Call to OR [name], MD       Facility-Administered Medications Ordered in Other Encounters  Medication Dose Route Frequency Provider Last Rate Last Dose  . fentaNYL (SUBLIMAZE) injection    Anesthesia Intra-op Neldon Newport, CRNA   1 mcg at 04/12/13 [id number]  . lactated ringers infusion    Continuous PRN Mosie Epstein, CRNA      . lactated ringers infusion    Continuous PRN Neldon Newport, CRNA      . midazolam (VERSED) 5 MG/5ML injection    Anesthesia Intra-op Neldon Newport, CRNA   1 mg at 04/12/13 0710     Review of Systems:     Cardiac Review of Systems: Y or N  Chest Pain [n    ]  Resting SOB [  y ] Exertional SOB  Blue.Reese  ]  Vertell Limber Florencio.Farrier  ]   Pedal Edema [ n  ]    Palpitations [ n ] Syncope  [ n ]   Presyncope [ n  ]  General Review of Systems: [Y] = yes [  ]=no Constitional: recent weight change [ y 30 to 39 ];  Wt loss over the last 3 months [   ] anorexia [  ]; fatigue [ y ]; nausea [  ]; night sweats [  ]; fever [  ]; or chills [  ];          Dental: poor dentition[  ]; Last Dentist visit:   Eye : blurred vision [  ]; diplopia [   ]; vision changes [  ];  Amaurosis fugax[  ]; Resp: cough [ y ];  wheezing[y  ];  hemoptysis[ n ]; shortness of breath[y  ]; paroxysmal nocturnal dyspnea[ y ]; dyspnea on exertion[ y ]; or orthopnea[ n ];  GI:  gallstones[  ], vomiting[  ];  dysphagia[  ]; melena[  ];  hematochezia [  ]; heartburn[  ];   Hx of  Colonoscopy[y  ]; GU: kidney stones [  ]; hematuria[  ];    dysuria [  ];  nocturia[  ];  history of     obstruction [  ]; urinary frequency [  ]             Skin: rash, swelling[  ];, hair loss[  ];  peripheral edema[  ];  or itching[  ]; Musculosketetal: myalgias[  ];  joint swelling[  ];  joint erythema[  ];  joint pain[  ];  back pain[  ];  Heme/Lymph: bruising[  ];  bleeding[  ];  anemia[  ];  Neuro: TIA[  ];  headaches[  ];  stroke[  ];  vertigo[  ];  seizures[  ];   paresthesias[  ];  difficulty walking[  ];  Psych:depression[  ]; anxiety[  ];  Endocrine: diabetes[  ];  thyroid dysfunction[  ];  Immunizations: Flu up to date [ y ]; Pneumococcal up to date Blue.Reese  ];  Other:  Physical Exam: BP 123/51  Pulse 81  Temp(Src) 98.1 F (36.7 C) (Oral)  Resp 18  SpO2 95%  PHYSICAL EXAMINATION:  General appearance: alert, cooperative, fatigued and no distress Neurologic: intact Heart: regular rate and rhythm, S1, S2 normal, no murmur, click, rub or gallop Lungs: diminished breath sounds bilaterally Abdomen: soft, non-tender; bowel sounds normal; no masses,  no organomegaly Extremities: extremities normal, atraumatic, no cyanosis or edema and Homans sign is negative, no sign of DVT no cartoid bruits, +1 dp and PT pulses bilaterial No cervical, axillary or supraclavicular adenopathy   Diagnostic Studies & Laboratory data:     Recent Radiology Findings:  Chest 2 View  04/11/2013   CLINICAL DATA:  Preop for left lobectomy. Hypertension. Lung carcinoma.  EXAM: CHEST  2 VIEW  COMPARISON:  Chest CT, 03/16/2013.  Chest radiograph, 03/01/2013.  FINDINGS: Cavitary left upper lobe mass is similar to the recent CT, increased from the prior chest radiograph.  Lung base reticular opacity is noted most likely combination scarring and subsegmental atelectasis. Lungs are otherwise clear. No pleural effusion. No pneumothorax.  Cardiac silhouette is normal in size. Normal mediastinal and hilar contours.  Bony thorax is demineralized. No osteoblastic or osteolytic  lesions.  IMPRESSION: No acute cardiopulmonary disease.  Left upper lobe cavitary mass stable when compared to the recent chest CT.   Electronically Signed   By: Lajean Manes M.D.   On: 04/11/2013 15:57    Dg Chest 1 View  03/01/2013   CLINICAL DATA:  Post lung biopsy attempt.  EXAM: CHEST - 1 VIEW  COMPARISON:  CT chest 01/01/2014  FINDINGS: The heart size and mediastinal contours are stable. Small irregular nodular opacity in the left mid lung, corresponding to the cavitary nodule described on CT is unchanged. The right lung is clear. Negative for pneumothorax or pleural effusion. No acute osseous abnormality. The visualized skeletal structures are unremarkable.  IMPRESSION: 1. Negative for pneumothorax. 2. Unchanged left mid lung irregular nodule.   Electronically Signed   By: Curlene Dolphin M.D.   On: 03/01/2013 12:17   Ct Biopsy  03/16/2013  CLINICAL DATA Left upper lobe lung nodule and history of uterine and cervical carcinoma. Attempt at CT-guided biopsy was aborted on 03/01/2013 due to development of hemoptysis. The patient has recovered and now presents for second attempt of percutaneous biopsy of the nodule.  EXAM CT GUIDED CORE BIOPSY OF LEFT LUNG NODULE  ANESTHESIA/SEDATION 4.0  Mg IV Versed; 100 mcg IV Fentanyl  Total Moderate Sedation Time: 25 minutes.  PROCEDURE The procedure risks, benefits, and alternatives were explained to the patient. Questions regarding the procedure were encouraged and answered. The patient understands and consents to the procedure.  The left anterior chest wall was prepped with Betadine in a sterile fashion, and a sterile drape was applied covering the operative field. A sterile gown and sterile gloves were used for the procedure. Local anesthesia was provided with 1% Lidocaine.  The patient was placed in a supine position. Prior to localizing scanning and sterile prep, breast tissue was taped in order to decrease the skin to lesion distance. Utilizing low-dose CT  fluoroscopic sequences with multiplanar reconstruction and 3D imaging, a 17 gauge trocar needle was advanced from an anterior approach to the lateral margin of a left upper lobe nodule. After confirming needle tip position, 2 separate 18 gauge core biopsy samples were obtained and placed in formalin. Additional CT was performed after needle removal.  Complications: Minimal pulmonary hemorrhage adjacent to the lesion.  FINDINGS Initial CT shows enlargement of the left upper lobe pulmonary nodule since the prior biopsy procedure with increased cavitation noted as well as increased soft tissue thickening posterolaterally. Maximal lesion dimensions on axial images currently estimated to be 2.3 x 3.0 cm. A more solid portion of the lesion along its lateral aspect was targeted. Solid core biopsy samples were obtained. The biopsy was complicated by a minimal amount of parenchymal hemorrhage adjacent to the nodule. No pneumothorax was present.  IMPRESSION CT-guided core biopsy performed of the cavitary left upper lobe nodule which demonstrates growth due to increased cavitation as well as increased component of solid tissue along the lateral and posterior aspect of the lesion. This more solid portion of the nodule was targeted for biopsy. Solid core biopsy samples were obtained.  SIGNATURE  Electronically Signed   By: Aletta Edouard M.D.   On: 03/16/2013 15:06   Ct Biopsy  03/01/2013   CLINICAL DATA:  73 year old with a cavitary lesion in the left upper lobe. Tissue diagnosis have been requested.  EXAM: ATTEMPTED CT BIOPSY OF LEFT LUNG LESION  Physician: Stephan Minister. Anselm Pancoast, MD  MEDICATIONS: 3 mg IV versed, 100 mcg IV fentanyl. A radiology nurse monitored the patient for moderate sedation.  ANESTHESIA/SEDATION: Moderate sedation time: 30 min  PROCEDURE: The procedure was explained to the patient. The risks and benefits of the procedure were discussed and the patient's questions were addressed. Specifically, the risks of  pneumothorax and hemoptysis were discussed with the patient. Informed consent was obtained from the patient. Patient was placed supine on the CT scanner. Images through the chest were obtained. Lesion in the left upper lobe was identified. The left arm was elevated. The lateral aspect of the left breast was prepped and draped in a sterile fashion. Skin was anesthetized with 1% lidocaine. The patient was intermittently coughing before and during the procedure. Due to the patient's coughing and breathing, it was difficult to direct the 17 gauge needle into the left upper lobe lesion. 43 gauge needle was identified adjacent to the lesion but not within the lesion. Patient continued to cough  throughout the procedure until a small amount of hemoptysis was noted. Patient's vital signs were stable despite the hemoptysis. Due to the hemoptysis, the procedure was stopped. Bandages were placed over the puncture sites. Patient was placed on her left side. The patient was monitored in the nursing station for 1 hr and had a follow-up chest radiograph. The hemoptysis resolved immediately after the procedure. Chest radiograph showed no evidence for pneumothorax. Patient was then transferred to Short Stay for additional recovery.  COMPLICATIONS: The procedure was stopped due to a small amount of hemoptysis. The hemoptysis resolved after the procedure.  FINDINGS: 1.6 cm cavitary lesion in the left upper lobe. Needle position was adjacent to the lesion but not within the lesion. A small amount of parenchymal hemorrhage adjacent to the lesion. Due to a small amount of hemoptysis, the procedure was stopped prior to obtaining a biopsy.  IMPRESSION: Attempted CT-guided biopsy of the left upper lung lesion. The procedure was stopped due to hemoptysis.   Electronically Signed   By: Markus Daft M.D.   On: 03/01/2013 13:56    PET: CLINICAL DATA: Initial treatment strategy for lung nodule.  EXAM:  NUCLEAR MEDICINE PET SKULL BASE TO THIGH    FASTING BLOOD GLUCOSE: Value: 111 mg/dl  TECHNIQUE:  12.0 mCi F-18 FDG was injected intravenously. Full-ring PET imaging  was performed from the skull base to thigh after the radiotracer. CT  data was obtained and used for attenuation correction and anatomic  localization.  COMPARISON: CT CHEST W/CM dated 02/01/2013  FINDINGS:  NECK  No hypermetabolic lymph nodes in the neck. CT images show no acute  findings.  CHEST  An irregular nodule with internal lucency in the lingula measures 10  x 15 mm and has an SUV max of 5.3 (PET image 58). Reticular  subpleural densities in the lingula and lateral left lower lobe  (example CT image 74) appear hypermetabolic as well (example PET  image 74). No additional areas of abnormal hypermetabolism in the  lungs. No hypermetabolic mediastinal or hilar lymph nodes.  CT images show atherosclerotic calcification of the arterial  vasculature, including coronary arteries. Heart is at the upper  limits of normal in size. No pericardial or pleural effusion.  ABDOMEN/PELVIS  No abnormal hypermetabolism in the liver, adrenal glands, spleen or  pancreas. No hypermetabolic lymph nodes.  CT images show a 10 mm low-attenuation lesion in the dome of the  liver, as before. Liver, gallbladder, adrenal glands, kidneys,  spleen, pancreas, stomach and bowel are otherwise grossly  unremarkable. Small periumbilical hernia contains fat. Hysterectomy.  Atherosclerotic calcification of the arterial vasculature without  abdominal aortic aneurysm.  SKELETON  No abnormal osseous hypermetabolism.  IMPRESSION:  1. Hypermetabolic irregular left upper lobe nodule is highly  worrisome for primary bronchogenic carcinoma.  2. Reticular subpleural opacities in the lingula and lateral left  lower lobe are mildly hypermetabolic but nonspecific. Findings do  not appear masslike, but rather fibrotic. Difficult to definitively  exclude malignancy. Continued attention on followup  exams is  warranted.  3. Small periumbilical hernia contains fat.  Electronically Signed  By: Lorin Picket M.D.  On: 02/17/2013 14:21  PFT's: 03/28/2013 done today FEV1 0.92 46%, DLCO 11.1 51%   Pulmonary Function Test  Date: 07/20/2009  Height (in.): 64  Gender: Female  Pre-Spirometry  FVC Value: 1.93 L/min Pred: 2.88 L/min % Pred: 67 %  FEV1 Value: 1.31 L Pred: 2.06 L % Pred: 64 %  FEV1/FVC Value: 68 % Pred: 72 % FEF  25-75 Value: 0.86 L/min Pred: 2.35 L/min % Pred: 36 %  Post-Spirometry  FVC Value: 1.82 L/min Pred: 2.88 L/min % Pred: 63 %  FEV1 Value: 1.24 L Pred: 2.06 L % Pred: 60 %  FEV1/FVC Value: 68 % Pred: 72 % FEF 25-75 Value: 0.74 L/min Pred: 2.35 L/min % Pred: 31 %  Lung Volumes  TLC Value: 3.49 L % Pred: 72 %  RV Value: 1.55 L % Pred: 81 %  DLCO Value: 15.4 % % Pred: 58 %  DLCO/VA Value: 5.38 % % Pred: 151 %  Comments:  Mild to mod AFL, no BD response, restricted volumes, DLCO corrects to normal range for Va. RSV    Recent Lab Findings: Lab Results  Component Value Date   WBC 11.5* 04/11/2013   HGB 11.8* 04/11/2013   HCT 35.6* 04/11/2013   PLT 268 04/11/2013   GLUCOSE 114* 04/11/2013   ALT 9 04/11/2013   AST 24 04/11/2013   NA 140 04/11/2013   K 4.4 04/11/2013   CL 102 04/11/2013   CREATININE 0.60 04/11/2013   BUN 18 04/11/2013   CO2 18* 04/11/2013   INR 0.91 04/11/2013   PATH: #[id number] [place] Diagnosis Lung, needle/core biopsy(ies), Left upper lobe - INVASIVE SQUAMOUS CELL CARCINOMA. - SEE COMMENT. Microscopic Comment The malignant cells are positive for cytokeratin 5/6, p63, and cytokeratin 7. They are negative for cytokeratin 20, napsin-A, and TTF-1. This profile supports a diagnosis of squamous cell carcinoma. Enid Cutter MD Pathologist, Electronic Signature (Case signed 03/21/2013)  Assessment / Plan:   Clinical Stage I squamous cell ca of the left upper lobe in patient  With limited pulmonary reserve and on methotrexate and Remicade. After discussion  with radiation therapy the patient wishes to proceed with limited pulmonary resection. I've discussed with her in detail the risks and options. With the cavitary lesion which has been increasing in size surgical resection even if it is a limited is reasonable to do. We will plan bronchoscopy left video-assisted thoracoscopy wedge resection and lymph node sampling on Tuesday, April 7.  The goals risks and alternatives of the planned surgical procedure bronchoscopy, left VATS, limited lung resection  have been discussed with the patient in detail. The risks of the procedure including death, infection, stroke, myocardial infarction, bleeding, blood transfusion have all been discussed specifically.  I have quoted Jinny Blossom a 5 % of perioperative mortality and a complication rate as high as 25 %. The patient's questions have been answered.VONNE JOANIS is willing  to proceed with the planned procedure.  [name] MD      [address] [place] [address] Office [phone]   Beeper [id number]  04/12/2013 7:19 AM

## 2013-04-12 NOTE — Anesthesia Postprocedure Evaluation (Signed)
  Anesthesia Post-op Note  Patient: Taylor Berry  Procedure(s) Performed: Procedure(s): VIDEO BRONCHOSCOPY (N/A) VIDEO ASSISTED THORACOSCOPY (VATS)/THOROCOTOMY, WITH LEFT UPPER LOBE WEDGE RESECTION, CHEST WALL BIOPSY  (Left) LYMPH NODE DISSECTION (Left)  Patient Location: PACU  Anesthesia Type:General  Level of Consciousness: awake and alert   Airway and Oxygen Therapy: Patient Spontanous Breathing  Post-op Pain: mild  Post-op Assessment: Post-op Vital signs reviewed  Post-op Vital Signs: stable  Complications: No apparent anesthesia complications

## 2013-04-13 ENCOUNTER — Inpatient Hospital Stay (HOSPITAL_COMMUNITY): Payer: Medicare Other

## 2013-04-13 LAB — POCT I-STAT 7, (LYTES, BLD GAS, ICA,H+H)
Acid-base deficit: 1 mmol/L (ref 0.0–2.0)
Bicarbonate: 26.8 mEq/L — ABNORMAL HIGH (ref 20.0–24.0)
Calcium, Ion: 1.21 mmol/L (ref 1.13–1.30)
HCT: 27 % — ABNORMAL LOW (ref 36.0–46.0)
Hemoglobin: 9.2 g/dL — ABNORMAL LOW (ref 12.0–15.0)
O2 Saturation: 99 %
Patient temperature: 35.3
Potassium: 4.3 mEq/L (ref 3.7–5.3)
Sodium: 138 mEq/L (ref 137–147)
TCO2: 29 mmol/L (ref 0–100)
pCO2 arterial: 54.8 mmHg — ABNORMAL HIGH (ref 35.0–45.0)
pH, Arterial: 7.289 — ABNORMAL LOW (ref 7.350–7.450)
pO2, Arterial: 170 mmHg — ABNORMAL HIGH (ref 80.0–100.0)

## 2013-04-13 LAB — BASIC METABOLIC PANEL
BUN: 7 mg/dL (ref 6–23)
CO2: 25 mEq/L (ref 19–32)
Calcium: 8.3 mg/dL — ABNORMAL LOW (ref 8.4–10.5)
Chloride: 104 mEq/L (ref 96–112)
Creatinine, Ser: 0.5 mg/dL (ref 0.50–1.10)
GFR calc Af Amer: >90 mL/min (ref >90)
GFR calc non Af Amer: >90 mL/min (ref >90)
Glucose, Bld: 127 mg/dL — ABNORMAL HIGH (ref 70–99)
Potassium: 3.9 mEq/L (ref 3.7–5.3)
Sodium: 139 mEq/L (ref 137–147)

## 2013-04-13 LAB — POCT I-STAT 3, ART BLOOD GAS (G3+)
Acid-Base Excess: 1 mmol/L (ref 0.0–2.0)
Bicarbonate: 27.1 mEq/L — ABNORMAL HIGH (ref 20.0–24.0)
O2 Saturation: 96 %
Patient temperature: 98.9
TCO2: 29 mmol/L (ref 0–100)
pCO2 arterial: 47.2 mmHg — ABNORMAL HIGH (ref 35.0–45.0)
pH, Arterial: 7.368 (ref 7.350–7.450)
pO2, Arterial: 84 mmHg (ref 80.0–100.0)

## 2013-04-13 LAB — CBC
HCT: 30.6 % — ABNORMAL LOW (ref 36.0–46.0)
Hemoglobin: 10.1 g/dL — ABNORMAL LOW (ref 12.0–15.0)
MCH: 30.6 pg (ref 26.0–34.0)
MCHC: 33 g/dL (ref 30.0–36.0)
MCV: 92.7 fL (ref 78.0–100.0)
Platelets: 132 10*3/uL — ABNORMAL LOW (ref 150–400)
RBC: 3.3 MIL/uL — ABNORMAL LOW (ref 3.87–5.11)
RDW: 14.5 % (ref 11.5–15.5)
WBC: 10.4 10*3/uL (ref 4.0–10.5)

## 2013-04-13 MED ORDER — SODIUM CHLORIDE 0.9 % IJ SOLN
3.0000 mL | Freq: Two times a day (BID) | INTRAMUSCULAR | Status: DC
  Administered 2013-04-13 – 2013-04-25 (×18): 3 mL via INTRAVENOUS

## 2013-04-13 MED ORDER — SODIUM CHLORIDE 0.9 % IJ SOLN: 3.0000 mL | INTRAMUSCULAR | Status: DC | PRN

## 2013-04-13 MED ORDER — ENOXAPARIN SODIUM 30 MG/0.3ML ~~LOC~~ SOLN
30.0000 mg | SUBCUTANEOUS | Status: DC
  Administered 2013-04-13 – 2013-04-14 (×2): 30 mg via SUBCUTANEOUS
  Filled 2013-04-13 (×3): qty 0.3

## 2013-04-13 MED ORDER — HYDROCOD POLST-CHLORPHEN POLST 10-8 MG/5ML PO LQCR: 5.0000 mL | Freq: Two times a day (BID) | ORAL | Status: DC | PRN

## 2013-04-13 NOTE — Addendum Note (Signed)
Addendum created 04/13/13 0915 by Neldon Newport, CRNA   Modules edited: Anesthesia Flowsheet

## 2013-04-13 NOTE — Plan of Care (Signed)
Problem: Phase I Progression Outcomes Goal: O2 sats > or equal to 88% with O2 Outcome: Progressing Down to  2 L/[place] O 2. IS 500 Goal: Activity tolerated as ordered Outcome: Progressing Up to chair x 2 and ambulated 200 feet x 1 today.

## 2013-04-13 NOTE — Progress Notes (Addendum)
      [address]       [name] [address]             [phone]      1 Day Post-Op Procedure(s) (LRB): VIDEO BRONCHOSCOPY (N/A) VIDEO ASSISTED THORACOSCOPY (VATS)/THOROCOTOMY, WITH LEFT UPPER LOBE WEDGE RESECTION, CHEST WALL BIOPSY  (Left) LYMPH NODE DISSECTION (Left)  Subjective:  Taylor Berry states she feels okay.  She has some mild pain 4/10, but states her pain medication is helping.  She is up in the chair but is requesting to go back in bed after breakfast  Objective: Vital signs in last 24 hours: Temp:  [97.6 F (36.4 C)-99.9 F (37.7 C)] 99.9 F (37.7 C) (04/08 0749) Pulse Rate:  [67-89] 72 (04/08 0700) Cardiac Rhythm:  [-] Normal sinus rhythm (04/08 0000) Resp:  [9-24] 22 (04/08 0700) BP: (114-162)/(52-107) 140/77 mmHg (04/08 0700) SpO2:  [88 %-100 %] 98 % (04/08 0700) Arterial Line BP: (125-180)/(59-83) 131/81 mmHg (04/08 0700) Weight:  [230 lb 6.1 oz (104.5 kg)] 230 lb 6.1 oz (104.5 kg) (04/08 0500)  Intake/Output from previous day: 04/07 0701 - 04/08 0700 In: 4738 [P.O.:30; I.V.:4508; IV Piggyback:200] Out: 2895 [Urine:2385; Blood:100; Chest Tube:410]  General appearance: alert, cooperative and no distress Heart: regular rate and rhythm Lungs: clear to auscultation bilaterally Abdomen: soft, non-tender; bowel sounds normal; no masses,  no organomegaly Wound: clean and dry  Lab Results:  Recent Labs  04/11/13 1458 04/13/13 0420  WBC 11.5* 10.4  HGB 11.8* 10.1*  HCT 35.6* 30.6*  PLT 268 132*   BMET:  Recent Labs  04/11/13 1458 04/13/13 0420  NA 140 139  K 4.4 3.9  CL 102 104  CO2 18* 25  GLUCOSE 114* 127*  BUN 18 7  CREATININE 0.60 0.50  CALCIUM 9.4 8.3*    PT/INR:  Recent Labs  04/11/13 1458  LABPROT 12.1  INR 0.91   ABG    Component Value Date/Time   PHART 7.368 04/13/2013 0421   HCO3 27.1* 04/13/2013 0421   TCO2 29 04/13/2013 0421   ACIDBASEDEF 1.0 01/27/2011 1154   O2SAT 96.0 04/13/2013 0421   CBG (last 3)    No results found for this basename: GLUCAP,  in the last 72 hours  Assessment/Plan: S/P Procedure(s) (LRB): VIDEO BRONCHOSCOPY (N/A) VIDEO ASSISTED THORACOSCOPY (VATS)/THOROCOTOMY, WITH LEFT UPPER LOBE WEDGE RESECTION, CHEST WALL BIOPSY  (Left) LYMPH NODE DISSECTION (Left)  1. Chest tube- 410 cc output since surgery, + air leak with cough, CXR order placed since not done this morning 2. CV- NSR, rate in the 70s, pressure 130s-140s- on Avapro 3. Pulm- + COPD, home inhalers ordered, continue nebulizers, stressed importance of good use of IS, wean oxygen as tolerated 4. Squamous Cell Carcinoma of Lung 5. Decrease IV Fluid 6. Dispo- patient stable, will watch in ICU today, leave chest tube on suction, leave foley in place, likely to 3300 tomorrow   LOS: 1 day    Lowella Dandy 04/13/2013  Up to chair with good resp effort Small air leak with cough- to water seal Mobilize today Expected Acute  Blood - loss Anemia I have seen and examined Taylor Berry and agree with the above assessment  and plan.  Delight Ovens MD Beeper [phone] Office [phone] 04/13/2013 9:09 AM

## 2013-04-13 NOTE — Op Note (Signed)
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  SURGICAL PROCEDURE:  Video bronchoscopy, left video-assisted thoracoscopy, mini-thoracotomy, wedge resection of left upper lobe with lymph node dissection, and biopsy of chest wall.  SURGEON:  Lanelle Bal, MD  FIRST ASSISTANT:  Providence Crosby, PA  BRIEF HISTORY:  The patient is a 73 year old female who was found on x- ray to have an enlarging cavitary lesion in the left upper lobe posteriorly.  Needle biopsy of this was performed confirming squamous cell carcinoma.  Evaluation with PET scan clinically states this lesion is a stage I. The patient had known pulmonary disease and had been followed by Dr. Marica Otter for several years.  PFTs showed an FEV1 of 0.9, and a diffusion capacity that was reduced.  Because of the enlarging cavitary lesion, a surgical resection was recommended to the patient. Stereotactic radiotherapy was also discussed with her.  She is aware of the increased risk of surgery because of the limited pulmonary reserve. She signed informed consent.  DESCRIPTION OF PROCEDURE:  With arterial line and central line in place, the patient underwent general endotracheal anesthesia without incident. A double-lumen endotracheal tube was placed by Anesthesia.  Through this double-lumen endotracheal tube, a fiberoptic bronchoscope was passed. With this, we determined that the tube was not into proper position and was manipulated for  proper ventilation of  both lungs and to __________ ventilate each individual lung.  The patient was then turned in lateral decubitus position with the left side up.  The skin had previously been marked.  Left chest was prepped with Betadine and draped in usual sterile manner.  Appropriate timeout had been performed.  We initially deflated the left lung and made a port incision in the midaxillary line approximately at the fourth intercostal space.  However, we found that the pleural space was almost totally obliterated laterally.  A mini thoracotomy incision was made.  Through this, we were able to dissect the upper lobe from the lower lobe and free up the right upper lobe. Portion of these adhesions were dissected out.  A port site was then created anteriorly and posteriorly further allowing [place] to  free up the upper lobe.  The lesion was identified in the lateral portion of the left upper lobe. With freeing of adhesions, we were able to use multiple firings of a purple load stapler to do a wedge resection.  This was submitted to Pathology, labeled anterior, posterior and superior margins.  In addition, an area where the lung had been adherent to the chest wall was also frozen.  Additional biopsies of the chest wall on this area were also obtained.  We then proceeded with lymph node dissection submitting lymph nodes from 4 L,  10 L, 11 L areas for permanent sections.  Two 28 chest tubes were then placed through the 2 port sites that had been used.  A CoSeal was placed on the stapled edge of the lung.  The upper lobe and lower lobe inflated nicely without obvious air leak.  There were 2 holes drilled in the lower rib of incisions and pericostal sutures placed.  The deep layers were closed with interrupted 0 Vicryl, running 3-0 Vicryl in subcutaneous tissue, and running 3-0 subcuticular stitch in the skin edges, and Dermabond was applied.  The patient was then awakened and extubated in the operating room, and  transferred to the recovery room for further postoperative care.  Blood loss was minimal.  Sponge and needle count was reported as correct at the completion of procedure.  The patient tolerated the procedure without obvious complication.     Lanelle Bal, MD     EG/MEDQ  D:  04/13/2013  T:  04/13/2013  Job:  [id number]

## 2013-04-13 NOTE — Progress Notes (Signed)
CT surgery p.m. Rounds  Patient resting comfortably Following left pulmonary section Strong cough, no air leak appreciated Breathing comfortably

## 2013-04-14 ENCOUNTER — Inpatient Hospital Stay (HOSPITAL_COMMUNITY): Payer: Medicare Other

## 2013-04-14 ENCOUNTER — Encounter (HOSPITAL_COMMUNITY): Payer: Self-pay | Admitting: Cardiothoracic Surgery

## 2013-04-14 LAB — CBC
HCT: 29.8 % — ABNORMAL LOW (ref 36.0–46.0)
Hemoglobin: 9.6 g/dL — ABNORMAL LOW (ref 12.0–15.0)
MCH: 29.9 pg (ref 26.0–34.0)
MCHC: 32.2 g/dL (ref 30.0–36.0)
MCV: 92.8 fL (ref 78.0–100.0)
Platelets: 135 10*3/uL — ABNORMAL LOW (ref 150–400)
RBC: 3.21 MIL/uL — ABNORMAL LOW (ref 3.87–5.11)
RDW: 14.5 % (ref 11.5–15.5)
WBC: 12.4 10*3/uL — ABNORMAL HIGH (ref 4.0–10.5)

## 2013-04-14 LAB — COMPREHENSIVE METABOLIC PANEL
ALT: 7 U/L (ref 0–35)
AST: 19 U/L (ref 0–37)
Albumin: 2.7 g/dL — ABNORMAL LOW (ref 3.5–5.2)
Alkaline Phosphatase: 51 U/L (ref 39–117)
BUN: 6 mg/dL (ref 6–23)
CO2: 25 mEq/L (ref 19–32)
Calcium: 8.6 mg/dL (ref 8.4–10.5)
Chloride: 98 mEq/L (ref 96–112)
Creatinine, Ser: 0.46 mg/dL — ABNORMAL LOW (ref 0.50–1.10)
GFR calc Af Amer: >90 mL/min (ref >90)
GFR calc non Af Amer: >90 mL/min (ref >90)
Glucose, Bld: 121 mg/dL — ABNORMAL HIGH (ref 70–99)
Potassium: 3.8 mEq/L (ref 3.7–5.3)
Sodium: 135 mEq/L — ABNORMAL LOW (ref 137–147)
Total Bilirubin: 0.8 mg/dL (ref 0.3–1.2)
Total Protein: 6.5 g/dL (ref 6.0–8.3)

## 2013-04-14 MED ORDER — DIPHENHYDRAMINE HCL 50 MG/ML IJ SOLN: 12.5000 mg | Freq: Four times a day (QID) | INTRAMUSCULAR | Status: DC | PRN

## 2013-04-14 MED ORDER — DIPHENHYDRAMINE HCL 12.5 MG/5ML PO ELIX
12.5000 mg | ORAL_SOLUTION | Freq: Four times a day (QID) | ORAL | Status: DC | PRN
  Filled 2013-04-14: qty 5

## 2013-04-14 MED ORDER — GUAIFENESIN ER 600 MG PO TB12
1200.0000 mg | ORAL_TABLET | Freq: Two times a day (BID) | ORAL | Status: DC
  Administered 2013-04-14 – 2013-04-28 (×29): 1200 mg via ORAL
  Filled 2013-04-14 (×32): qty 2

## 2013-04-14 MED ORDER — ACETYLCYSTEINE 10 % IN SOLN
4.0000 mL | Freq: Four times a day (QID) | RESPIRATORY_TRACT | Status: DC
  Filled 2013-04-14 (×4): qty 4

## 2013-04-14 MED ORDER — ACETYLCYSTEINE 20 % IN SOLN
4.0000 mL | Freq: Four times a day (QID) | RESPIRATORY_TRACT | Status: DC
  Administered 2013-04-14 (×2): 4 mL via RESPIRATORY_TRACT
  Filled 2013-04-14 (×4): qty 4

## 2013-04-14 MED ORDER — FUROSEMIDE 10 MG/ML IJ SOLN
40.0000 mg | Freq: Once | INTRAMUSCULAR | Status: AC
  Administered 2013-04-14: 40 mg via INTRAVENOUS
  Filled 2013-04-14: qty 4

## 2013-04-14 NOTE — Care Management Note (Addendum)
Page 1 of 2   04/28/2013     11:02:27 AM CARE MANAGEMENT NOTE 04/28/2013  Patient:  Taylor Berry, Taylor Berry   Account Number:  [id number]  Date Initiated:  04/12/2013  Documentation initiated by:  [facility]  Subjective/Objective Assessment:   post op mini thoracotomy.     Action/Plan:   Anticipated DC Date:  04/28/2013   Anticipated DC Plan:  SKILLED NURSING FACILITY  In-house referral  Clinical Social Worker      DC Planning Services  CM consult      Choice offered to / List presented to:     DME arranged  3-N-1  [place]      DME agency  [place] arranged  [place].   Status of service:  Completed, signed off Medicare Important Message given?   (If response is "NO", the following Medicare IM given date fields will be blank) Date Medicare IM given:   Date Additional Medicare IM given:    Discharge Disposition:  [place]  Per UR Regulation:  Reviewed for med. necessity/level of care/duration of stay  If discussed at [place] of Stay Meetings, dates discussed:   04/21/2013  04/26/2013  04/28/2013    Comments:  Contact:  Novitsky,Paul Son [phone]  04/28/13- 1030- Ladena Jacquez RN, BSN [phone] Pt will need 2 weeks of IV abx, has PICC, spoke with pt at bedside regarding d/c to SNF vs home with [facility] - pt wants to check to see if [facility] can do the IV abx- confirmed with [facility] - that they can do the IV abx via PICC and have a confirmed held bed today - discussed with pt SNF vs HH option - pt would prefer to go to [facility] for rehab and IV abx- call placed to PA-Donielle Z. to inform of pt's choice and that [facility] can accept pt today.  Pt would like family to transport to SNF. CSW aware and following for placement needs  04/25/13- 1445- Marvetta Gibbons RN, BSN [phone] Pt had concerns regarding discharge to home  and requested SNF for rehab- CSW was consulted for placement needs- and SNF bead search completed with bed found at [facility]- pt with increase wound drainage over weekend- and increase in WBC- wound culture done and pending- remains on iv abx. d/c plan to SNF when medically stable.  04-20-13 [place], RN,BSN [phone] Pt had questions in regards to [facility] services and DME. CM did go by to speak to pt in reference to DME. RN to ambulate pt again to see if she qualifies for 02. RN to call CM if qualifies.   04/19/2013 1530 NCM spoke to pt and states she will need RW, 3n1 and suction at home. She has a CPAP but stopped wearing last summer. States it was difficult to sleep with it at night. Notified [facility] of [place] orders and DME equipment needed. Jonnie Finner RN CCM Case Mgmt phone [phone]  04-14-13 [place], [place] [phone] Patient lives at home alone, has a son but son works all the time.  Lives in [place] also.  has a sister who lives in [place] who had planned to come and be with her but now sisters husband is having some tests and needs to be there.  may come up  for a few days but not sure when. Explained to patient that [facility] could come in but they do not stay with her - they would do visists lasting maybe from 30-60 minutes.  Patient again confimed that they could not stay with her.  Informed her that agencies could do that but that insurance would not pay for it.  Is not interested in that.  Discussed other options, ST rehab facility - not interested in this but felt she has friends that could come in and assist and be with her.  Talked about possibility of [place] agencies.  Would like [facility] to follow when discharged. Probable need for [facility] RN,  PT and NA.  May need oxygen. Nurse states is pretty independent now.  walking with assistance.

## 2013-04-14 NOTE — Progress Notes (Addendum)
      [address]       [place] [address]             [phone]      2 Days Post-Op Procedure(s) (LRB): VIDEO BRONCHOSCOPY (N/A) VIDEO ASSISTED THORACOSCOPY (VATS)/THOROCOTOMY, WITH LEFT UPPER LOBE WEDGE RESECTION, CHEST WALL BIOPSY  (Left) LYMPH NODE DISSECTION (Left)  Subjective:  Ms. Emigh states she feels awful this morning.  She states that she is struggling to cough up sputum and has been unable to sleep at night.  She is ambulating some around the unit.   Objective: Vital signs in last 24 hours: Temp:  [97.9 F (36.6 C)-99 F (37.2 C)] 98.3 F (36.8 C) (04/09 0405) Pulse Rate:  [71-100] 98 (04/09 0700) Cardiac Rhythm:  [-] Normal sinus rhythm (04/09 0000) Resp:  [17-31] 31 (04/09 0700) BP: (67-157)/(49-112) 125/98 mmHg (04/09 0600) SpO2:  [90 %-99 %] 93 % (04/09 0700) Arterial Line BP: (134-160)/(58-63) 139/62 mmHg (04/08 1300)  Intake/Output from previous day: 04/08 0701 - 04/09 0700 In: 3226 [P.O.:1940; I.V.:1286] Out: 2355 [Urine:2295; Chest Tube:60]  General appearance: alert, cooperative and no distress Heart: regular rate and rhythm Lungs: diminished breath sounds bibasilar Wound: clean and dry, serous drainage around chest tube dressing, some sub q air present  Lab Results:  Recent Labs  04/13/13 0420 04/14/13 0420  WBC 10.4 12.4*  HGB 10.1* 9.6*  HCT 30.6* 29.8*  PLT 132* 135*   BMET:  Recent Labs  04/13/13 0420 04/14/13 0420  NA 139 135*  K 3.9 3.8  CL 104 98  CO2 25 25  GLUCOSE 127* 121*  BUN 7 6  CREATININE 0.50 0.46*  CALCIUM 8.3* 8.6    PT/INR:  Recent Labs  04/11/13 1458  LABPROT 12.1  INR 0.91   ABG    Component Value Date/Time   PHART 7.368 04/13/2013 0421   HCO3 27.1* 04/13/2013 0421   TCO2 29 04/13/2013 0421   ACIDBASEDEF 1.0 04/12/2013 1026   O2SAT 96.0 04/13/2013 0421   CBG (last 3)  No results found for this basename: GLUCAP,  in the last 72 hours  Assessment/Plan: S/P Procedure(s)  (LRB): VIDEO BRONCHOSCOPY (N/A) VIDEO ASSISTED THORACOSCOPY (VATS)/THOROCOTOMY, WITH LEFT UPPER LOBE WEDGE RESECTION, CHEST WALL BIOPSY  (Left) LYMPH NODE DISSECTION (Left)  1. Chest tube- place on water seal yesterday, no air leak appreciated this morning- CXR free of pneumothorax, + atelectasis, some sub-q air present 2. CV- NSR good rate and pressure control- continue Avapro 3. Pulm- + productive cough, she is struggling to expectorate sputum, will continue Xopenex, add Mucinex- if no relief could consider adding Mucomyst to nebulizer treatements 4. Decrease IV Fluids to KVO 5. D/C Foley 6. Dispo- patient stable, not sleeping, possibly remove one chest tube today, LOS: 2 days    Ellwood Handler 04/14/2013  Path noted, Stage 1 see problem list staging Work on pulm toilet I have seen and examined Jinny Blossom and agree with the above assessment  and plan.  [name] MD Beeper [phone] Office [phone] 04/14/2013 8:26 AM

## 2013-04-14 NOTE — Progress Notes (Signed)
POD # 2 LUL Wedge  Sitting up in chair Continues to complain about difficulty clearing mucous  BP 109/93  Pulse 98  Temp(Src) 99.2 F (37.3 C) (Oral)  Resp 18  Ht 5\' 2"  (1.575 m)  Wt 230 lb 6.1 oz (104.5 kg)  BMI 42.13 kg/m2  SpO2 90%   Intake/Output Summary (Last 24 hours) at 04/14/13 1753 Last data filed at 04/14/13 1600  Gross per 24 hour  Intake   3142 ml  Output    830 ml  Net   2312 ml    Continue current care

## 2013-04-15 ENCOUNTER — Encounter: Payer: Self-pay | Admitting: *Deleted

## 2013-04-15 ENCOUNTER — Inpatient Hospital Stay (HOSPITAL_COMMUNITY): Payer: Medicare Other

## 2013-04-15 ENCOUNTER — Ambulatory Visit: Payer: Medicare Other | Admitting: Emergency Medicine

## 2013-04-15 MED ORDER — FUROSEMIDE 10 MG/ML IJ SOLN
40.0000 mg | Freq: Once | INTRAMUSCULAR | Status: AC
  Administered 2013-04-15: 40 mg via INTRAVENOUS
  Filled 2013-04-15: qty 4

## 2013-04-15 MED ORDER — ACETYLCYSTEINE 20 % IN SOLN
4.0000 mL | Freq: Three times a day (TID) | RESPIRATORY_TRACT | Status: AC
  Administered 2013-04-15 (×3): 4 mL via RESPIRATORY_TRACT
  Filled 2013-04-15 (×4): qty 4

## 2013-04-15 MED ORDER — LEVALBUTEROL HCL 0.63 MG/3ML IN NEBU
0.6300 mg | INHALATION_SOLUTION | Freq: Three times a day (TID) | RESPIRATORY_TRACT | Status: DC
  Administered 2013-04-15 – 2013-04-21 (×21): 0.63 mg via RESPIRATORY_TRACT
  Filled 2013-04-15 (×37): qty 3

## 2013-04-15 MED ORDER — ENOXAPARIN SODIUM 40 MG/0.4ML ~~LOC~~ SOLN
40.0000 mg | SUBCUTANEOUS | Status: DC
  Administered 2013-04-15 – 2013-04-28 (×14): 40 mg via SUBCUTANEOUS
  Filled 2013-04-15 (×15): qty 0.4

## 2013-04-15 NOTE — Progress Notes (Signed)
Pt ambulated in hallway 300 feet while pushing wheelchair with standby assist. Pt tolerated well. Will continue to monitor.  Vella Raring, RN

## 2013-04-15 NOTE — Progress Notes (Signed)
Patient's reduced dose fentanyl PCA pump discontinued.  13 ml of fentanyl wasted in sink at 0950.  Witnessed by Velva Harman, RN  Bobette Mo

## 2013-04-15 NOTE — Discharge Summary (Signed)
Physician Discharge Summary  Patient ID: Taylor Berry MRN: [id number] DOB/AGE: 1940/07/25 73 y.o.  Admit date: 04/12/2013 Discharge date: 04/28/2013  Admission Diagnoses:  Patient Active Problem List   Diagnosis Date Noted  . Acute respiratory failure 04/22/2013  . Lung cancer, left upper lobe 02/03/2013  . Pulmonary infiltrates 01/05/2013  . Mild pulmonary hypertension 02/17/2011  . OSA (obstructive sleep apnea) 02/17/2011  . Dyspnea on exertion 11/22/2010  . COPD 07/20/2009  . Carcinoma in situ of cervix uteri 06/26/2009  . PURE HYPERCHOLESTEROLEMIA 06/26/2009  . OBESITY 06/26/2009  . UNSPECIFIED ANEMIA 06/26/2009  . HYPERTENSION, BENIGN 06/26/2009  . ARTHRITIS, RHEUMATOID 06/26/2009  . OSTEOPOROSIS 06/26/2009  . Cough 06/26/2009   Discharge Diagnoses:   Patient Active Problem List   Diagnosis Date Noted  . Acute respiratory failure 04/22/2013  . Lung cancer, left upper lobe 02/03/2013  . Pulmonary infiltrates 01/05/2013  . Mild pulmonary hypertension 02/17/2011  . OSA (obstructive sleep apnea) 02/17/2011  . Dyspnea on exertion 11/22/2010  . COPD 07/20/2009  . Carcinoma in situ of cervix uteri 06/26/2009  . PURE HYPERCHOLESTEROLEMIA 06/26/2009  . OBESITY 06/26/2009  . UNSPECIFIED ANEMIA 06/26/2009  . HYPERTENSION, BENIGN 06/26/2009  . ARTHRITIS, RHEUMATOID 06/26/2009  . OSTEOPOROSIS 06/26/2009  . Cough 06/26/2009   Discharged Condition: good  History of Present Illness:   [name] is a 41 yo obese female who presented with a 5 year complaint of cough.  She has a known history of RA on immunosuppression therapy, mild AFL with marginal BD response, and diastolic dysfxn.  The patient underwent workup with CT scan of the chest with revealed a mass in her Left Upper Lobe.  PET CT scan was also done and showed evidence of hypermetabolic activity and highly suspicious for bronchogenic carcinoma.  She initially underwent need biopsy which revealed Invasive Squamous  Cell Carcinoma.  Due to this she was referred to [facility] for possible surgical resection.  She was initially evaluated by Dr. Servando Snare on 03/27/2013 at which time it was felt she would need to undergo PFTs to assess if she would be a candidate for resection vs. Stereotactic radiation.  These were completed and showed a reduced FEV 1 of 0.9 and very limited pulmonary reserve.  It was felt she would not be a candidate for lobectomy, but would also be a poor candidate for wedge resection.  It was felt she should be evaluated by a Radiation Oncologist prior to making any definitive decision.  After evaluation by the Radiation Oncologist the patient wished to proceed with limited surgical resection.  The risks and benefits of the procedure were explained to the patient and she was agreeable to proceed.    Hospital Course:   [name] presented to [facility] on 04/12/2013.  She was taken to the operating room and underwent Video Bronchoscopy, Left Video Assisted Mini Thoracotomy with wedge resection of the left upper lobe, chest wall biopsy, and lymph node dissection.  She tolerated the procedure without difficulty.  She was extubated and taken to the SICU in stable condition.  During her stay in the ICU, the patient had difficulty expectorating sputum.  She was receiving Xopenex nebs which did not provide much relief.  She was therefore started on Mucinex and Mucomyst was added for 24 hours to help break up her chest congestion.  Her chest tube showed a small air leak the morning after surgery.  This chest tube was transitioned to water seal and follow up CXR was obtained.  CXR  did not show any evidence of pneumothorax, and air leak had resolved.  Therefore, her anterior chest tube was removed without difficulty.  The patient was able to clear secretions better after use of Mucomyst.  Her final chest tube did not exhibit and air leak and was therefore removed. Follow up CXR obtained shows a small apical  pneumothorax.  This has remained stable during her hospitalization.  The patient has continued to have a productive cough with sputum production.  A sputum culture was obtained and revealed Haemophilus Influenza.  She also was found to have Klebsiella Pneumonia and E. Coli in her urine.She will require a 10 day course of Cephalosporin.  She was treated with IV Lasix for hypervolemia.  Once medically stable, she was transferred to the step down unit in stable condition.  The patient continues to progress.  She did have hyponatremia. She was put on fluid restriction. Her last sodium was up to 135. In addition, she developed erythema and drainage from her left chest wound. She was given Vancomycin in addition to the Ceftin. Her wound remains closed. There is minor erythema and it is no longer draining. Her WBC decreased to normal (10,500) and she remains afebrile. Wound culture then showed Pseudomonas Aeruginosa. Vanco and Ceftin were stopped and she was started on Maxipime. This will be continue for 14 days after discharge. Her PICC line may be removed after her last dose of IV antibiotics, which should be on 05/11/2013. She has had anemia post op. She did not require transfusion. She was put on oral iron and folic acid. Her last H and H was up to 9.4 and 29.3. She requires a lot of encouragement to stay out of bed and ambulate.  She will require daily physical therapy in order to help her with mobility. She is medically stable at this time.  She is felt surgically stable for discharge to [facility] today.  She will follow up with Dr. Servando Snare in 2 weeks with a CXR prior to her appointment.    Significant Diagnostic Studies:   1. Hypermetabolic irregular left upper lobe nodule is highly  worrisome for primary bronchogenic carcinoma.  2. Reticular subpleural opacities in the lingula and lateral left  lower lobe are mildly hypermetabolic but nonspecific. Findings do  not appear masslike, but rather fibrotic.  Difficult to definitively  exclude malignancy. Continued attention on followup exams is  warranted.  3. Small periumbilical hernia contains fat.   Pathology: Diagnosis 1. Lung, wedge biopsy/resection, Left upper lobe - INVASIVE SQUAMOUS CELL CARCINOMA WITH CAVITATION. - TUMOR INVOLVES VISCERAL PLEURA. - RESECTION MARGINS ARE NEGATIVE. - SEE ONCOLOGY TABLE. 2. Lymph node, biopsy, 10 L - ONE OF ONE LYMPH NODE NEGATIVE FOR CARCINOMA (0/1). 3. Lymph node, biopsy, 11 L - ONE OF ONE LYMPH NODE NEGATIVE FOR CARCINOMA (0/1). 4. Lymph node, biopsy, 4 L - ONE OF ONE LYMPH NODE NEGATIVE FOR CARCINOMA (0/1). 5. Lymph node, biopsy, 5 L - ONE OF ONE LYMPH NODE NEGATIVE FOR CARCINOMA (0/1). 6. Soft tissue, biopsy, Chest wall - BENIGN FIBROADIPOSE TISSUE, MUSCLE, AND NERVE. - NO MALIGNANCY IDENTIFIED.  Treatments: surgery:   Video bronchoscopy, left video-assisted thoracoscopy, mini-thoracotomy, wedge resection of left upper lobe with  lymph node dissection, and biopsy of chest wall  Disposition: Home  Discharge Medications:   Medication List    STOP taking these medications       tiotropium 18 MCG inhalation capsule  Commonly known as:  [place] these  medications       aspirin EC 81 MG tablet  Take 81 mg by mouth daily.     budesonide 0.25 MG/2ML nebulizer solution  Commonly known as:  PULMICORT  Take 2 mLs (0.25 mg total) by nebulization 2 (two) times daily.     calcium-vitamin D 500-200 MG-UNIT per tablet  Commonly known as:  OSCAL WITH D  Take 1 tablet by mouth every other day.     ceFEPIme 2 g in dextrose 5 % 50 mL  Inject 2 g into the vein every 12 (twelve) hours. Last dose is to be given on 05/11/2013.     cetirizine 10 MG tablet  Commonly known as:  ZYRTEC  Take 10 mg by mouth daily.     famotidine 20 MG tablet  Commonly known as:  PEPCID  Take 20 mg by mouth at bedtime. One at bedtime     ferrous sulfate 325 (65 FE) MG tablet  Take 1 tablet (325 mg  total) by mouth daily with breakfast. For one month then  stop.     fluticasone 50 MCG/ACT nasal spray  Commonly known as:  FLONASE  Place 2 sprays into the nose 2 (two) times daily as needed for allergies.     folic acid 606 MCG tablet  Commonly known as:  FOLVITE  Take 800 mcg by mouth daily.     guaiFENesin 600 MG 12 hr tablet  Commonly known as:  MUCINEX  Take 2 tablets (1,200 mg total) by mouth 2 (two) times daily as needed.     olmesartan 40 MG tablet  Commonly known as:  BENICAR  Take 20 mg by mouth daily.     omeprazole 40 MG capsule  Commonly known as:  PRILOSEC  Take 40 mg by mouth daily.     simvastatin 20 MG tablet  Commonly known as:  ZOCOR  Take 20 mg by mouth at bedtime.     traMADol 50 MG tablet  Commonly known as:  ULTRAM  Take 1 tablet (50 mg total) by mouth every 6 (six) hours as needed for moderate pain.     venlafaxine XR 75 MG 24 hr capsule  Commonly known as:  EFFEXOR-XR  Take 75 mg by mouth daily.     VENTOLIN HFA 108 (90 BASE) MCG/ACT inhaler  Generic drug:  albuterol  Inhale 2 puffs into the lungs every 6 (six) hours as needed for wheezing or shortness of breath.     VITAMIN C PO  Take 1 tablet by mouth daily.     VITAMIN D PO  Take 1 tablet by mouth daily.         Discharge Orders   Future Appointments Provider Department Dept Phone   05/05/2013 1:30 PM [name], MD [facility] [phone]   05/10/2013 2:15 PM Melvenia Needles, NP [facility] [phone]   06/23/2013 10:45 AM Max Villa Herb, [place] [facility] [place] [facility]   Future Orders Complete By Expires   Discharge instructions  As directed    Scheduling Instructions:   [place]: Please have Physical Therapy done at least daily (7 Days per week). Please remove PICC line after last dose of IV antibiotic on 05/11/2013.     Follow-up Information   Follow up with [name], MD On 05/05/2013.  (Appointment is at 1:30)    Specialty:  Cardiothoracic Surgery   Contact information:   [place] [address]  [phone]       Follow up with [facility] On 05/05/2013. (Please get CXR at 12:30)    Contact information:   [place]       Follow up with [place]. [facility], RN and aide)    Contact information:   [address] [place] [address] [phone]       Follow up with [place]. (Rolling walker, 3n1 and Portable Suction.)    Contact information:   [place] [address] [phone]       Follow up with [facility], NP On 05/10/2013. (Appointment time is at 2:15 pm)    Specialty:  Nurse Practitioner   Contact information:   [place]. [place] [address] [phone]       Signed: Arnoldo Lenis 04/28/2013, 12:18 PM

## 2013-04-15 NOTE — Progress Notes (Signed)
Called patient to check in.  I left a voice mail message with contact information.

## 2013-04-15 NOTE — Progress Notes (Signed)
Patient ID: Taylor Berry, female   DOB: 1940-11-18, 73 y.o.   MRN: [id number] EVENING ROUNDS NOTE :     [address]       [place] [address]             [phone]                 3 Days Post-Op Procedure(s) (LRB): VIDEO BRONCHOSCOPY (N/A) VIDEO ASSISTED THORACOSCOPY (VATS)/THOROCOTOMY, WITH LEFT UPPER LOBE WEDGE RESECTION, CHEST WALL BIOPSY  (Left) LYMPH NODE DISSECTION (Left)  Total Length of Stay:  LOS: 3 days  BP 110/69  Pulse 100  Temp(Src) 98 F (36.7 C) (Oral)  Resp 21  Ht 5\' 2"  (1.575 m)  Wt 232 lb 5.8 oz (105.4 kg)  BMI 42.49 kg/m2  SpO2 97%  .Intake/Output     04/09 0701 - 04/10 0700 04/10 0701 - 04/11 0700   P.O.  360   I.V. (mL/kg) 532 (5) 160 (1.5)   Other 1800    Total Intake(mL/kg) 2332 (22.1) 520 (4.9)   Urine (mL/kg/hr) 2175 (0.9) 700 (0.6)   Chest Tube 110 (0) 40 (0)   Total Output 2285 740   Net +47 -220          . dextrose 5 % and 0.45 % NaCl with KCl 10 mEq/L 20 mL/hr at 04/15/13 1500     Lab Results  Component Value Date   WBC 12.4* 04/14/2013   HGB 9.6* 04/14/2013   HCT 29.8* 04/14/2013   PLT 135* 04/14/2013   GLUCOSE 121* 04/14/2013   ALT 7 04/14/2013   AST 19 04/14/2013   NA 135* 04/14/2013   K 3.8 04/14/2013   CL 98 04/14/2013   CREATININE 0.46* 04/14/2013   BUN 6 04/14/2013   CO2 25 04/14/2013   INR 0.91 04/11/2013   Stable, walked better today, waiting for step down bed  [name] MD  Beeper [phone] Office [phone] 04/15/2013 6:45 PM

## 2013-04-15 NOTE — Progress Notes (Signed)
Patient ID: Taylor Berry, female   DOB: [birth year], 73 y.o.   MRN: [id number] [facility] DAILY ICU PROGRESS NOTE                   [address]            [place] [address]          [phone]   3 Days Post-Op Procedure(s) (LRB): VIDEO BRONCHOSCOPY (N/A) VIDEO ASSISTED THORACOSCOPY (VATS)/THOROCOTOMY, WITH LEFT UPPER LOBE WEDGE RESECTION, CHEST WALL BIOPSY  (Left) LYMPH NODE DISSECTION (Left)  Total Length of Stay:  LOS: 3 days   Subjective: Up to chair walked part way around unit, better clearing of secreations  Objective: Vital signs in last 24 hours: Temp:  [98.3 F (36.8 C)-99.2 F (37.3 C)] 98.3 F (36.8 C) (04/10 0400) Pulse Rate:  [67-108] 77 (04/10 0700) Cardiac Rhythm:  [-] Normal sinus rhythm (04/09 2000) Resp:  [16-30] 17 (04/10 0700) BP: (88-155)/(51-119) 155/72 mmHg (04/10 0700) SpO2:  [89 %-100 %] 98 % (04/10 0700) FiO2 (%):  [94 %] 94 % (04/09 0800) Weight:  [232 lb 5.8 oz (105.4 kg)] 232 lb 5.8 oz (105.4 kg) (04/10 0300)  Filed Weights   04/13/13 0500 04/15/13 0300  Weight: 230 lb 6.1 oz (104.5 kg) 232 lb 5.8 oz (105.4 kg)    Weight change:    Hemodynamic parameters for last 24 hours:    Intake/Output from previous day: 04/09 0701 - 04/10 0700 In: 2332 [I.V.:532] Out: 2285 [Urine:2175; Chest Tube:110]  Intake/Output this shift:    Current Meds: Scheduled Meds: . acetylcysteine  4 mL Nebulization TID  . aspirin EC  81 mg Oral Daily  . bisacodyl  10 mg Oral Daily  . calcium-vitamin D  1 tablet Oral QODAY  . enoxaparin (LOVENOX) injection  40 mg Subcutaneous Q24H  . famotidine  20 mg Oral QHS  . folic acid  1 mg Oral Daily  . furosemide  40 mg Intravenous Once  . guaiFENesin  1,200 mg Oral BID  . irbesartan  150 mg Oral Daily  . levalbuterol  0.63 mg Nebulization TID  . loratadine  10 mg Oral Daily  . pantoprazole  80 mg Oral Daily  . simvastatin  20 mg Oral QHS  . sodium chloride  3 mL Intravenous Q12H  . tiotropium  18  mcg Inhalation Daily  . venlafaxine XR  75 mg Oral Daily  . vitamin C  250 mg Oral Daily   Continuous Infusions: . dextrose 5 % and 0.45 % NaCl with KCl 10 mEq/L 20 mL/hr at 04/15/13 0700   PRN Meds:.albuterol, chlorpheniramine-HYDROcodone, fluticasone, oxyCODONE-acetaminophen, potassium chloride, senna-docusate, sodium chloride  General appearance: alert and cooperative Neurologic: intact Heart: regular rate and rhythm, S1, S2 normal, no murmur, click, rub or gallop Lungs: diminished breath sounds bibasilar Abdomen: soft, non-tender; bowel sounds normal; no masses,  no organomegaly Extremities: extremities normal, atraumatic, no cyanosis or edema and Homans sign is negative, no sign of DVT Wound: no air leak ct to water seal  Lab Results: CBC: Recent Labs  04/13/13 0420 04/14/13 0420  WBC 10.4 12.4*  HGB 10.1* 9.6*  HCT 30.6* 29.8*  PLT 132* 135*   BMET:  Recent Labs  04/13/13 0420 04/14/13 0420  NA 139 135*  K 3.9 3.8  CL 104 98  CO2 25 25  GLUCOSE 127* 121*  BUN 7 6  CREATININE 0.50 0.46*  CALCIUM 8.3* 8.6    PT/INR: No results found for this  basename: LABPROT, INR,  in the last 72 hours Radiology: Dg Chest Port 1 View  04/14/2013   CLINICAL DATA:  Chest tube.  EXAM: PORTABLE CHEST - 1 VIEW  COMPARISON:  DG CHEST 1V PORT dated 04/13/2013; CT BIOPSY dated 03/16/2013; CT CHEST W/CM dated 02/01/2013; DG CHEST 2 VIEW dated 04/11/2013; DG CHEST 1V PORT dated 04/12/2013  FINDINGS: Left IJ line in stable position in left brachiocephalic vein. Left chest tubes in stable position, no pneumothorax. Left chest wall subcutaneous emphysema. Stable focal consolidation left mid lung. Subsegmental atelectasis lung bases. Stable cardiomegaly, no pulmonary venous congestion. No acute osseous abnormality.  IMPRESSION: 1. Stable positioning of left IJ line and 2 left chest tubes. No pneumothorax. Left chest wall subcutaneous emphysema. 2. Stable focal consolidation within left mid lung field.    Electronically Signed   By: Marcello Moores  Register   On: 04/14/2013 07:35   Dg Chest Port 1 View  04/13/2013   CLINICAL DATA:  Postoperative lung wedge resection  EXAM: PORTABLE CHEST - 1 VIEW  COMPARISON:  April 12, 2013  FINDINGS: Central catheter tip is in the left innominate vein. There are 2 chest tubes on the left. There is no apparent pneumothorax. Consolidation in the left mid lung is stable. Elsewhere lungs are clear. Heart size and pulmonary vascularity are normal. No adenopathy.  IMPRESSION: Stable focal consolidation left mid lung. Lungs otherwise clear. Tube and catheter positions as described without pneumothorax.   Electronically Signed   By: Lowella Grip M.D.   On: 04/13/2013 08:28     Assessment/Plan: S/P Procedure(s) (LRB): VIDEO BRONCHOSCOPY (N/A) VIDEO ASSISTED THORACOSCOPY (VATS)/THOROCOTOMY, WITH LEFT UPPER LOBE WEDGE RESECTION, CHEST WALL BIOPSY  (Left) LYMPH NODE DISSECTION (Left) Mobilize Diuresis d/c tubes/lines ambulate more , d/c chest tube      [name] 04/15/2013 7:45 AM

## 2013-04-16 ENCOUNTER — Inpatient Hospital Stay (HOSPITAL_COMMUNITY): Payer: Medicare Other

## 2013-04-16 LAB — BASIC METABOLIC PANEL
BUN: 11 mg/dL (ref 6–23)
CO2: 29 mEq/L (ref 19–32)
Calcium: 8.7 mg/dL (ref 8.4–10.5)
Chloride: 95 mEq/L — ABNORMAL LOW (ref 96–112)
Creatinine, Ser: 0.48 mg/dL — ABNORMAL LOW (ref 0.50–1.10)
GFR calc Af Amer: >90 mL/min (ref >90)
GFR calc non Af Amer: >90 mL/min (ref >90)
Glucose, Bld: 123 mg/dL — ABNORMAL HIGH (ref 70–99)
Potassium: 3.8 mEq/L (ref 3.7–5.3)
Sodium: 134 mEq/L — ABNORMAL LOW (ref 137–147)

## 2013-04-16 LAB — CBC
HCT: 28.5 % — ABNORMAL LOW (ref 36.0–46.0)
Hemoglobin: 9.4 g/dL — ABNORMAL LOW (ref 12.0–15.0)
MCH: 30.2 pg (ref 26.0–34.0)
MCHC: 33 g/dL (ref 30.0–36.0)
MCV: 91.6 fL (ref 78.0–100.0)
Platelets: 186 10*3/uL (ref 150–400)
RBC: 3.11 MIL/uL — ABNORMAL LOW (ref 3.87–5.11)
RDW: 14.7 % (ref 11.5–15.5)
WBC: 12.8 10*3/uL — ABNORMAL HIGH (ref 4.0–10.5)

## 2013-04-16 MED ORDER — SODIUM CHLORIDE 0.9 % IJ SOLN
10.0000 mL | Freq: Two times a day (BID) | INTRAMUSCULAR | Status: DC
  Administered 2013-04-16 – 2013-04-18 (×5): 10 mL
  Administered 2013-04-18: 20 mL
  Administered 2013-04-19 – 2013-04-26 (×6): 10 mL

## 2013-04-16 MED ORDER — SODIUM CHLORIDE 0.9 % IJ SOLN: 10.0000 mL | INTRAMUSCULAR | Status: DC | PRN

## 2013-04-16 NOTE — Progress Notes (Signed)
Patient ID: Taylor Berry, female   DOB: Jan 22, 1940, 73 y.o.   MRN: [id number] EVENING ROUNDS NOTE :     [address]       [place] [address]             [phone]                 4 Days Post-Op Procedure(s) (LRB): VIDEO BRONCHOSCOPY (N/A) VIDEO ASSISTED THORACOSCOPY (VATS)/THOROCOTOMY, WITH LEFT UPPER LOBE WEDGE RESECTION, CHEST WALL BIOPSY  (Left) LYMPH NODE DISSECTION (Left)  Total Length of Stay:  LOS: 4 days  BP 100/71  Pulse 42  Temp(Src) 97.5 F (36.4 C) (Oral)  Resp 23  Ht 5\' 2"  (1.575 m)  Wt 232 lb 5.8 oz (105.4 kg)  BMI 42.49 kg/m2  SpO2 63%  .Intake/Output     04/11 0701 - 04/12 0700   P.O. 720   I.V. (mL/kg)    Total Intake(mL/kg) 720 (6.8)   Urine (mL/kg/hr) 1800 (1.3)   Chest Tube    Total Output 1800   Net -1080         . dextrose 5 % and 0.45 % NaCl with KCl 10 mEq/L Stopped (04/15/13 1600)     Lab Results  Component Value Date   WBC 12.8* 04/16/2013   HGB 9.4* 04/16/2013   HCT 28.5* 04/16/2013   PLT 186 04/16/2013   GLUCOSE 123* 04/16/2013   ALT 7 04/14/2013   AST 19 04/14/2013   NA 134* 04/16/2013   K 3.8 04/16/2013   CL 95* 04/16/2013   CREATININE 0.48* 04/16/2013   BUN 11 04/16/2013   CO2 29 04/16/2013   INR 0.91 04/11/2013   Stable day Ambulated today   [name] MD  Beeper [phone] Office [phone] 04/16/2013 7:41 PM

## 2013-04-16 NOTE — Plan of Care (Signed)
Problem: Phase II Progression Outcomes Goal: Weaning O2 for sats > or equal to 88% Outcome: Progressing O2 down to 2L [place]

## 2013-04-16 NOTE — Progress Notes (Signed)
Patient ID: Taylor Berry, female   DOB: 07/17/1940, 73 y.o.   MRN: [id number] [facility] DAILY ICU PROGRESS NOTE                   [address] [address]            [place] [address]          [phone]   4 Days Post-Op Procedure(s) (LRB): VIDEO BRONCHOSCOPY (N/A) VIDEO ASSISTED THORACOSCOPY (VATS)/THOROCOTOMY, WITH LEFT UPPER LOBE WEDGE RESECTION, CHEST WALL BIOPSY  (Left) LYMPH NODE DISSECTION (Left)  Total Length of Stay:  LOS: 4 days   Subjective: Up to chair , ambulating some, waiting for stepdown bed  Objective: Vital signs in last 24 hours: Temp:  [97.4 F (36.3 C)-98.8 F (37.1 C)] 97.4 F (36.3 C) (04/11 0824) Pulse Rate:  [65-100] 88 (04/11 0700) Cardiac Rhythm:  [-] Normal sinus rhythm (04/11 0800) Resp:  [13-27] 24 (04/11 0700) BP: (76-136)/(31-92) 125/55 mmHg (04/11 0600) SpO2:  [93 %-100 %] 99 % (04/11 0849)  Filed Weights   04/13/13 0500 04/15/13 0300  Weight: 230 lb 6.1 oz (104.5 kg) 232 lb 5.8 oz (105.4 kg)    Weight change:    Hemodynamic parameters for last 24 hours:    Intake/Output from previous day: 04/10 0701 - 04/11 0700 In: 540 [P.O.:360; I.V.:180] Out: 2190 [Urine:2150; Chest Tube:40]  Intake/Output this shift: Total I/O In: -  Out: 600 [Urine:600]  Current Meds: Scheduled Meds: . aspirin EC  81 mg Oral Daily  . bisacodyl  10 mg Oral Daily  . calcium-vitamin D  1 tablet Oral QODAY  . enoxaparin (LOVENOX) injection  40 mg Subcutaneous Q24H  . famotidine  20 mg Oral QHS  . folic acid  1 mg Oral Daily  . guaiFENesin  1,200 mg Oral BID  . irbesartan  150 mg Oral Daily  . levalbuterol  0.63 mg Nebulization TID  . loratadine  10 mg Oral Daily  . pantoprazole  80 mg Oral Daily  . simvastatin  20 mg Oral QHS  . sodium chloride  10-40 mL Intracatheter Q12H  . sodium chloride  3 mL Intravenous Q12H  . tiotropium  18 mcg Inhalation Daily  . venlafaxine XR  75 mg Oral Daily  . vitamin C  250 mg Oral Daily   Continuous  Infusions: . dextrose 5 % and 0.45 % NaCl with KCl 10 mEq/L Stopped (04/15/13 1600)   PRN Meds:.albuterol, chlorpheniramine-HYDROcodone, fluticasone, oxyCODONE-acetaminophen, potassium chloride, senna-docusate, sodium chloride, sodium chloride  General appearance: alert and cooperative Neurologic: intact Heart: regular rate and rhythm, S1, S2 normal, no murmur, click, rub or gallop Lungs: diminished breath sounds bibasilar Abdomen: soft, non-tender; bowel sounds normal; no masses,  no organomegaly Extremities: extremities normal, atraumatic, no cyanosis or edema and Homans sign is negative, no sign of DVT Wound: intact chest tubes are out  Lab Results: CBC: Recent Labs  04/14/13 0420 04/16/13 0359  WBC 12.4* 12.8*  HGB 9.6* 9.4*  HCT 29.8* 28.5*  PLT 135* 186   BMET:  Recent Labs  04/14/13 0420 04/16/13 0359  NA 135* 134*  K 3.8 3.8  CL 98 95*  CO2 25 29  GLUCOSE 121* 123*  BUN 6 11  CREATININE 0.46* 0.48*  CALCIUM 8.6 8.7    PT/INR: No results found for this basename: LABPROT, INR,  in the last 72 hours Radiology: Dg Chest Port 1 View  04/15/2013   CLINICAL DATA:  Left-sided lung cancer. Status post wedge resection  in the left upper lobe.  EXAM: PORTABLE CHEST - 1 VIEW  COMPARISON:  One-view chest 04/14/2013  FINDINGS: Mild cardiac enlargement is present. A left IJ line is stable. The left-sided chest tube is in place. There is no significant hemothorax. Subcutaneous emphysema and is decreasing.  The lung volumes remain low. Bilateral pleural effusions are suspected. Bibasilar airspace disease is not significantly changed. A focal area of opacification in the left midlung is stable. Mild pulmonary vascular congestion has slightly increased.  IMPRESSION: 1. Stable support apparatus. 2. No significant left-sided pneumothorax. 3. Decreasing subcutaneous emphysema. 4. Stable bibasilar atelectasis. 5. Stable opacification left mid lung. 6. Mild pulmonary vascular congestion is  slightly increased.   Electronically Signed   By: Taylor Berry M.D.   On: 04/15/2013 07:50     Assessment/Plan: S/P Procedure(s) (LRB): VIDEO BRONCHOSCOPY (N/A) VIDEO ASSISTED THORACOSCOPY (VATS)/THOROCOTOMY, WITH LEFT UPPER LOBE WEDGE RESECTION, CHEST WALL BIOPSY  (Left) LYMPH NODE DISSECTION (Left) Mobilize Follow up chest xray this am    Taylor Berry 04/16/2013 9:02 AM

## 2013-04-17 LAB — BASIC METABOLIC PANEL
BUN: 10 mg/dL (ref 6–23)
CO2: 27 mEq/L (ref 19–32)
Calcium: 8.9 mg/dL (ref 8.4–10.5)
Chloride: 90 mEq/L — ABNORMAL LOW (ref 96–112)
Creatinine, Ser: 0.49 mg/dL — ABNORMAL LOW (ref 0.50–1.10)
GFR calc Af Amer: >90 mL/min (ref >90)
GFR calc non Af Amer: >90 mL/min (ref >90)
Glucose, Bld: 122 mg/dL — ABNORMAL HIGH (ref 70–99)
Potassium: 3.8 mEq/L (ref 3.7–5.3)
Sodium: 129 mEq/L — ABNORMAL LOW (ref 137–147)

## 2013-04-17 LAB — POCT I-STAT 4, (NA,K, GLUC, HGB,HCT)
Glucose, Bld: 142 mg/dL — ABNORMAL HIGH (ref 70–99)
HCT: 29 % — ABNORMAL LOW (ref 36.0–46.0)
Hemoglobin: 9.9 g/dL — ABNORMAL LOW (ref 12.0–15.0)
Potassium: 3.6 mEq/L — ABNORMAL LOW (ref 3.7–5.3)
Sodium: 129 mEq/L — ABNORMAL LOW (ref 137–147)

## 2013-04-17 LAB — CBC
HCT: 28.2 % — ABNORMAL LOW (ref 36.0–46.0)
Hemoglobin: 9.5 g/dL — ABNORMAL LOW (ref 12.0–15.0)
MCH: 30.3 pg (ref 26.0–34.0)
MCHC: 33.7 g/dL (ref 30.0–36.0)
MCV: 89.8 fL (ref 78.0–100.0)
Platelets: 116 10*3/uL — ABNORMAL LOW (ref 150–400)
RBC: 3.14 MIL/uL — ABNORMAL LOW (ref 3.87–5.11)
RDW: 14.9 % (ref 11.5–15.5)
WBC: 17 10*3/uL — ABNORMAL HIGH (ref 4.0–10.5)

## 2013-04-17 MED ORDER — TRAMADOL HCL 50 MG PO TABS
50.0000 mg | ORAL_TABLET | Freq: Four times a day (QID) | ORAL | Status: DC | PRN
  Administered 2013-04-17 – 2013-04-27 (×12): 50 mg via ORAL
  Filled 2013-04-17 (×12): qty 1

## 2013-04-17 MED ORDER — FUROSEMIDE 40 MG PO TABS
40.0000 mg | ORAL_TABLET | Freq: Once | ORAL | Status: AC
Start: 2013-04-17 — End: 2013-04-17
  Administered 2013-04-17: 40 mg via ORAL
  Filled 2013-04-17: qty 1

## 2013-04-17 MED ORDER — ACETAMINOPHEN 325 MG PO TABS
650.0000 mg | ORAL_TABLET | Freq: Four times a day (QID) | ORAL | Status: DC | PRN
  Administered 2013-04-17 – 2013-04-26 (×11): 650 mg via ORAL
  Filled 2013-04-17 (×11): qty 2

## 2013-04-17 MED ORDER — POTASSIUM CHLORIDE CRYS ER 20 MEQ PO TBCR
20.0000 meq | EXTENDED_RELEASE_TABLET | Freq: Once | ORAL | Status: AC
  Administered 2013-04-17: 20 meq via ORAL
  Filled 2013-04-17: qty 1

## 2013-04-17 MED ORDER — POLYETHYLENE GLYCOL 3350 17 G PO PACK
17.0000 g | PACK | Freq: Every day | ORAL | Status: DC | PRN
  Filled 2013-04-17: qty 1

## 2013-04-17 NOTE — Progress Notes (Signed)
Increased frequency of unifocal PVCs, bigeminy and trigeminy noted.  Chem4 drawn.  K+3.6.  Results to Dr. Servando Snare.  Orders rec'd.

## 2013-04-17 NOTE — Progress Notes (Signed)
Patient ID: MONICIA MARROQUIN, female   DOB: Jan 14, 1940, 73 y.o.   MRN: [id number] TCTS DAILY ICU PROGRESS NOTE                   [address]            [place] [address]          [phone]   5 Days Post-Op Procedure(s) (LRB): VIDEO BRONCHOSCOPY (N/A) VIDEO ASSISTED THORACOSCOPY (VATS)/THOROCOTOMY, WITH LEFT UPPER LOBE WEDGE RESECTION, CHEST WALL BIOPSY  (Left) LYMPH NODE DISSECTION (Left)  Total Length of Stay:  LOS: 5 days   Subjective: Up in chair walked early this am, progressing, feels like breathing is better, waiting for rehab bed  Objective: Vital signs in last 24 hours: Temp:  [97.4 F (36.3 C)-99.6 F (37.6 C)] 97.8 F (36.6 C) (04/12 0732) Pulse Rate:  [42-102] 80 (04/12 0800) Cardiac Rhythm:  [-] Normal sinus rhythm (04/12 0800) Resp:  [21-27] 23 (04/12 0800) BP: (100-140)/(50-72) 114/71 mmHg (04/12 0800) SpO2:  [63 %-100 %] 97 % (04/12 0800)  Filed Weights   04/13/13 0500 04/15/13 0300  Weight: 230 lb 6.1 oz (104.5 kg) 232 lb 5.8 oz (105.4 kg)    Weight change:    Hemodynamic parameters for last 24 hours:    Intake/Output from previous day: 04/11 0701 - 04/12 0700 In: 1680 [P.O.:1680] Out: 2150 [Urine:2150]  Intake/Output this shift:    Current Meds: Scheduled Meds: . aspirin EC  81 mg Oral Daily  . bisacodyl  10 mg Oral Daily  . calcium-vitamin D  1 tablet Oral QODAY  . enoxaparin (LOVENOX) injection  40 mg Subcutaneous Q24H  . famotidine  20 mg Oral QHS  . folic acid  1 mg Oral Daily  . furosemide  40 mg Oral Once  . guaiFENesin  1,200 mg Oral BID  . irbesartan  150 mg Oral Daily  . levalbuterol  0.63 mg Nebulization TID  . loratadine  10 mg Oral Daily  . pantoprazole  80 mg Oral Daily  . simvastatin  20 mg Oral QHS  . sodium chloride  10-40 mL Intracatheter Q12H  . sodium chloride  3 mL Intravenous Q12H  . tiotropium  18 mcg Inhalation Daily  . venlafaxine XR  75 mg Oral Daily  . vitamin C  250 mg Oral Daily    Continuous Infusions: . dextrose 5 % and 0.45 % NaCl with KCl 10 mEq/L Stopped (04/15/13 1600)   PRN Meds:.acetaminophen, albuterol, chlorpheniramine-HYDROcodone, fluticasone, polyethylene glycol, potassium chloride, senna-docusate, sodium chloride, sodium chloride, traMADol  General appearance: alert, cooperative and appears older than stated age Neurologic: intact Heart: regular rate and rhythm, S1, S2 normal, no murmur, click, rub or gallop Lungs: diminished breath sounds bibasilar and wheezes bilaterally Abdomen: soft, non-tender; bowel sounds normal; no masses,  no organomegaly Extremities: extremities normal, atraumatic, no cyanosis or edema and Homans sign is negative, no sign of DVT Wound: intact  Lab Results: CBC: Recent Labs  04/16/13 0359 04/17/13 0400  WBC 12.8* 17.0*  HGB 9.4* 9.5*  HCT 28.5* 28.2*  PLT 186 116*   BMET:  Recent Labs  04/16/13 0359 04/17/13 0400  NA 134* 129*  K 3.8 3.8  CL 95* 90*  CO2 29 27  GLUCOSE 123* 122*  BUN 11 10  CREATININE 0.48* 0.49*  CALCIUM 8.7 8.9    PT/INR: No results found for this basename: LABPROT, INR,  in the last 72 hours Radiology: Dg Chest 2 View  04/16/2013  CLINICAL DATA:  Postop left chest surgery.  EXAM: CHEST  2 VIEW  COMPARISON:  04/15/2013  FINDINGS: Since the previous exam, left chest tube has been removed. There is a small left apical pneumothorax. This was not clearly present on the prior exam.  Airspace opacity in the left midlung subtly more hazy left lung base opacity is similar. There is evidence of a small effusion on the left.  Mild right basilar atelectasis is stable.  Left internal jugular central venous line is stable. There is stable left antral lateral chest wall subcutaneous emphysema.  IMPRESSION: 1. Status post removal of the left chest tube. 2. Small left pneumothorax is now apparent. 3. No other change from the prior study.   Electronically Signed   By: Lajean Manes M.D.   On: 04/16/2013 11:41      Assessment/Plan: S/P Procedure(s) (LRB): VIDEO BRONCHOSCOPY (N/A) VIDEO ASSISTED THORACOSCOPY (VATS)/THOROCOTOMY, WITH LEFT UPPER LOBE WEDGE RESECTION, CHEST WALL BIOPSY  (Left) LYMPH NODE DISSECTION (Left) Mobilize Diuresis home 2 days, home arrangements , sister coming to [place] today  Waiting for stepdown bed    [name] 04/17/2013 8:20 AM

## 2013-04-18 ENCOUNTER — Inpatient Hospital Stay (HOSPITAL_COMMUNITY): Payer: Medicare Other

## 2013-04-18 LAB — CBC
HCT: 27.7 % — ABNORMAL LOW (ref 36.0–46.0)
Hemoglobin: 9.2 g/dL — ABNORMAL LOW (ref 12.0–15.0)
MCH: 29.6 pg (ref 26.0–34.0)
MCHC: 33.2 g/dL (ref 30.0–36.0)
MCV: 89.1 fL (ref 78.0–100.0)
Platelets: 131 10*3/uL — ABNORMAL LOW (ref 150–400)
RBC: 3.11 MIL/uL — ABNORMAL LOW (ref 3.87–5.11)
RDW: 14.6 % (ref 11.5–15.5)
WBC: 23 10*3/uL — ABNORMAL HIGH (ref 4.0–10.5)

## 2013-04-18 LAB — BASIC METABOLIC PANEL
BUN: 11 mg/dL (ref 6–23)
CO2: 27 mEq/L (ref 19–32)
Calcium: 8.7 mg/dL (ref 8.4–10.5)
Chloride: 88 mEq/L — ABNORMAL LOW (ref 96–112)
Creatinine, Ser: 0.5 mg/dL (ref 0.50–1.10)
GFR calc Af Amer: >90 mL/min (ref >90)
GFR calc non Af Amer: >90 mL/min (ref >90)
Glucose, Bld: 123 mg/dL — ABNORMAL HIGH (ref 70–99)
Potassium: 4 mEq/L (ref 3.7–5.3)
Sodium: 126 mEq/L — ABNORMAL LOW (ref 137–147)

## 2013-04-18 LAB — EXPECTORATED SPUTUM ASSESSMENT W GRAM STAIN, RFLX TO RESP C: Special Requests: NORMAL

## 2013-04-18 MED ORDER — CIPROFLOXACIN HCL 500 MG PO TABS
500.0000 mg | ORAL_TABLET | Freq: Two times a day (BID) | ORAL | Status: DC
  Administered 2013-04-18 – 2013-04-22 (×9): 500 mg via ORAL
  Filled 2013-04-18 (×12): qty 1

## 2013-04-18 NOTE — Progress Notes (Signed)
Patient ID: Taylor Berry, female   DOB: 11/30/1940, 73 y.o.   MRN: [id number] TCTS DAILY ICU PROGRESS NOTE                   [place]            [place] [address]          [phone]   6 Days Post-Op Procedure(s) (LRB): VIDEO BRONCHOSCOPY (N/A) VIDEO ASSISTED THORACOSCOPY (VATS)/THOROCOTOMY, WITH LEFT UPPER LOBE WEDGE RESECTION, CHEST WALL BIOPSY  (Left) LYMPH NODE DISSECTION (Left)  Total Length of Stay:  LOS: 6 days   Subjective: Feels better less cough, no fever  Objective: Vital signs in last 24 hours: Temp:  [97.3 F (36.3 C)-98.9 F (37.2 C)] 98.8 F (37.1 C) (04/13 0000) Pulse Rate:  [80-110] 81 (04/13 0700) Cardiac Rhythm:  [-] Normal sinus rhythm (04/13 0400) Resp:  [18-37] 18 (04/13 0700) BP: (107-125)/(52-97) 120/62 mmHg (04/13 0400) SpO2:  [94 %-100 %] 97 % (04/13 0700) Weight:  [232 lb 9.4 oz (105.5 kg)] 232 lb 9.4 oz (105.5 kg) (04/13 0500)  Filed Weights   04/13/13 0500 04/15/13 0300 04/18/13 0500  Weight: 230 lb 6.1 oz (104.5 kg) 232 lb 5.8 oz (105.4 kg) 232 lb 9.4 oz (105.5 kg)    Weight change:    Hemodynamic parameters for last 24 hours:    Intake/Output from previous day: 04/12 0701 - 04/13 0700 In: 840 [P.O.:840] Out: 1350 [Urine:1350]  Intake/Output this shift:    Current Meds: Scheduled Meds: . aspirin EC  81 mg Oral Daily  . bisacodyl  10 mg Oral Daily  . calcium-vitamin D  1 tablet Oral QODAY  . enoxaparin (LOVENOX) injection  40 mg Subcutaneous Q24H  . famotidine  20 mg Oral QHS  . folic acid  1 mg Oral Daily  . guaiFENesin  1,200 mg Oral BID  . irbesartan  150 mg Oral Daily  . levalbuterol  0.63 mg Nebulization TID  . loratadine  10 mg Oral Daily  . pantoprazole  80 mg Oral Daily  . simvastatin  20 mg Oral QHS  . sodium chloride  10-40 mL Intracatheter Q12H  . sodium chloride  3 mL Intravenous Q12H  . tiotropium  18 mcg Inhalation Daily  . venlafaxine XR  75 mg Oral Daily  . vitamin C  250 mg Oral  Daily   Continuous Infusions: . dextrose 5 % and 0.45 % NaCl with KCl 10 mEq/L Stopped (04/15/13 1600)   PRN Meds:.acetaminophen, albuterol, chlorpheniramine-HYDROcodone, fluticasone, polyethylene glycol, potassium chloride, senna-docusate, sodium chloride, sodium chloride, traMADol  General appearance: alert and cooperative Neurologic: intact Heart: regular rate and rhythm, S1, S2 normal, no murmur, click, rub or gallop Lungs: diminished breath sounds bibasilar Abdomen: soft, non-tender; bowel sounds normal; no masses,  no organomegaly Extremities: extremities normal, atraumatic, no cyanosis or edema and Homans sign is negative, no sign of DVT Wound: wound intact no drainage or redness  Lab Results: CBC: Recent Labs  04/17/13 0400 04/17/13 1427 04/18/13 0400  WBC 17.0*  --  23.0*  HGB 9.5* 9.9* 9.2*  HCT 28.2* 29.0* 27.7*  PLT 116*  --  131*   BMET:  Recent Labs  04/17/13 0400 04/17/13 1427 04/18/13 0400  NA 129* 129* 126*  K 3.8 3.6* 4.0  CL 90*  --  88*  CO2 27  --  27  GLUCOSE 122* 142* 123*  BUN 10  --  11  CREATININE 0.49*  --  0.50  CALCIUM 8.9  --  8.7    PT/INR: No results found for this basename: LABPROT, INR,  in the last 72 hours Radiology: Dg Chest 2 View  04/18/2013   CLINICAL DATA:  Status post thoracotomy and partial lobectomy  EXAM: CHEST  2 VIEW  COMPARISON:  DG CHEST 2 VIEW dated 04/16/2013; CT BIOPSY dated 03/01/2013  FINDINGS: There is a persistent small left apical pneumothorax. There is persistent left mid lung airspace opacity. There is a small left pleural effusion. There is right basilar atelectasis. Stable cardiomediastinal silhouette. Left jugular central venous catheter are in unchanged position.  IMPRESSION: 1. Persistent small left apical pneumothorax. 2. Persistent left mid lung airspace disease likely reflecting postsurgical changes.   Electronically Signed   By: Kathreen Devoid   On: 04/18/2013 05:18   Dg Chest 2 View  04/16/2013   CLINICAL  DATA:  Postop left chest surgery.  EXAM: CHEST  2 VIEW  COMPARISON:  04/15/2013  FINDINGS: Since the previous exam, left chest tube has been removed. There is a small left apical pneumothorax. This was not clearly present on the prior exam.  Airspace opacity in the left midlung subtly more hazy left lung base opacity is similar. There is evidence of a small effusion on the left.  Mild right basilar atelectasis is stable.  Left internal jugular central venous line is stable. There is stable left antral lateral chest wall subcutaneous emphysema.  IMPRESSION: 1. Status post removal of the left chest tube. 2. Small left pneumothorax is now apparent. 3. No other change from the prior study.   Electronically Signed   By: Lajean Manes M.D.   On: 04/16/2013 11:41     Assessment/Plan: S/P Procedure(s) (LRB): VIDEO BRONCHOSCOPY (N/A) VIDEO ASSISTED THORACOSCOPY (VATS)/THOROCOTOMY, WITH LEFT UPPER LOBE WEDGE RESECTION, CHEST WALL BIOPSY  (Left) LYMPH NODE DISSECTION (Left)  Hyponatremia, restrict fluids, no lasix today Leucocytosis, no fever  Get sputum culture and start po antibiotics Waiting for stepdown still   [name] 04/18/2013 7:31 AM

## 2013-04-18 NOTE — Progress Notes (Signed)
TCTS BRIEF SICU PROGRESS NOTE  6 Days Post-Op  S/P Procedure(s) (LRB): VIDEO BRONCHOSCOPY (N/A) VIDEO ASSISTED THORACOSCOPY (VATS)/THOROCOTOMY, WITH LEFT UPPER LOBE WEDGE RESECTION, CHEST WALL BIOPSY  (Left) LYMPH NODE DISSECTION (Left)   Stable day Still waiting for bed on step-down for transfer  Plan: Continue current plan  Rexene Alberts 04/18/2013 6:39 PM

## 2013-04-18 NOTE — Progress Notes (Signed)
Cipro rescheduled for 1200 per pharmacy. Will administer medication at that time.

## 2013-04-19 ENCOUNTER — Encounter (HOSPITAL_COMMUNITY): Payer: Medicare Other

## 2013-04-19 LAB — CBC
HCT: 26.4 % — ABNORMAL LOW (ref 36.0–46.0)
Hemoglobin: 8.8 g/dL — ABNORMAL LOW (ref 12.0–15.0)
MCH: 29.8 pg (ref 26.0–34.0)
MCHC: 33.3 g/dL (ref 30.0–36.0)
MCV: 89.5 fL (ref 78.0–100.0)
Platelets: 136 10*3/uL — ABNORMAL LOW (ref 150–400)
RBC: 2.95 MIL/uL — ABNORMAL LOW (ref 3.87–5.11)
RDW: 14.7 % (ref 11.5–15.5)
WBC: 18.6 10*3/uL — ABNORMAL HIGH (ref 4.0–10.5)

## 2013-04-19 LAB — BASIC METABOLIC PANEL
BUN: 13 mg/dL (ref 6–23)
CO2: 26 mEq/L (ref 19–32)
Calcium: 8.6 mg/dL (ref 8.4–10.5)
Chloride: 89 mEq/L — ABNORMAL LOW (ref 96–112)
Creatinine, Ser: 0.57 mg/dL (ref 0.50–1.10)
GFR calc Af Amer: >90 mL/min (ref >90)
GFR calc non Af Amer: >90 mL/min (ref >90)
Glucose, Bld: 120 mg/dL — ABNORMAL HIGH (ref 70–99)
Potassium: 4.3 mEq/L (ref 3.7–5.3)
Sodium: 128 mEq/L — ABNORMAL LOW (ref 137–147)

## 2013-04-19 NOTE — Progress Notes (Signed)
Pt on room air at rest with O2 sats in the low 90's. Pt ambulated on room air and desats to mid/low 80's, 1L O2 placed on pt while ambulating and sats increased to low 90's. MD notified.

## 2013-04-19 NOTE — Progress Notes (Addendum)
      [address]       ,[place] [address]             [phone]      7 Days Post-Op Procedure(s) (LRB): VIDEO BRONCHOSCOPY (N/A) VIDEO ASSISTED THORACOSCOPY (VATS)/THOROCOTOMY, WITH LEFT UPPER LOBE WEDGE RESECTION, CHEST WALL BIOPSY  (Left) LYMPH NODE DISSECTION (Left)  Subjective:  Taylor Berry continues to have cough, which is chronic.  She is ambulating but cries during.  She wants to sit in bed all day.  She was instructed on the disadvantages of sitting in bed all day.  She was encouraged to stay in the chair until lunch and then she can rest in bed until dinner and then she should finish the evening in the chair.   Objective: Vital signs in last 24 hours: Temp:  [97.9 F (36.6 C)-99.9 F (37.7 C)] 97.9 F (36.6 C) (04/14 0752) Pulse Rate:  [61-116] 89 (04/14 0800) Cardiac Rhythm:  [-] Normal sinus rhythm (04/14 0800) Resp:  [20-31] 22 (04/14 0800) BP: (103-130)/(43-82) 130/82 mmHg (04/14 0800) SpO2:  [90 %-100 %] 92 % (04/14 0800) Weight:  [234 lb 3.2 oz (106.232 kg)] 234 lb 3.2 oz (106.232 kg) (04/14 0500)  Intake/Output from previous day: 04/13 0701 - 04/14 0700 In: 730 [P.O.:700; I.V.:30] Out: 600 [Urine:600] Intake/Output this shift: Total I/O In: 120 [P.O.:120] Out: -   General appearance: alert, cooperative and no distress Heart: regular rate and rhythm Lungs: clear to auscultation bilaterally Abdomen: soft, non-tender; bowel sounds normal; no masses,  no organomegaly Wound: clean and dry  Lab Results:  Recent Labs  04/18/13 0400 04/19/13 0450  WBC 23.0* 18.6*  HGB 9.2* 8.8*  HCT 27.7* 26.4*  PLT 131* 136*   BMET:  Recent Labs  04/18/13 0400 04/19/13 0450  NA 126* 128*  K 4.0 4.3  CL 88* 89*  CO2 27 26  GLUCOSE 123* 120*  BUN 11 13  CREATININE 0.50 0.57  CALCIUM 8.7 8.6    PT/INR: No results found for this basename: LABPROT, INR,  in the last 72 hours ABG    Component Value Date/Time   PHART 7.368 04/13/2013 0421    HCO3 27.1* 04/13/2013 0421   TCO2 29 04/13/2013 0421   ACIDBASEDEF 1.0 04/12/2013 1026   O2SAT 96.0 04/13/2013 0421   CBG (last 3)  No results found for this basename: GLUCAP,  in the last 72 hours  Assessment/Plan: S/P Procedure(s) (LRB): VIDEO BRONCHOSCOPY (N/A) VIDEO ASSISTED THORACOSCOPY (VATS)/THOROCOTOMY, WITH LEFT UPPER LOBE WEDGE RESECTION, CHEST WALL BIOPSY  (Left) LYMPH NODE DISSECTION (Left)  1. Renal- remains hyponatremic- improved to 128, continue fluid restriction, hold Lasix 2. Leukocytosis improving, patient remains afebrile, sputum culture pending, respiratory culture + for many GN coccobacili- on Cipro 3. Dispo- patient stable, continue Cipro, will go to 3300 today   LOS: 7 days    Ellwood Handler 04/19/2013  To step down today On Cipro,  pulmonary culture pending  desats when ambulates off o2 Will need o2 on discharge home I have seen and examined Taylor Berry and agree with the above assessment  and plan.  [name] MD Beeper [phone] Office [phone] 04/19/2013 9:20 AM

## 2013-04-19 NOTE — Progress Notes (Signed)
Pt transported to 2South room 9 on telemetry, Pt vitals WNL, no complaints of Pain, GCS 15, bedside report given, receiving nurse at bedside. Safety maintained.

## 2013-04-19 NOTE — Progress Notes (Signed)
   CARE MANAGEMENT NOTE 04/19/2013  Patient:  Taylor Berry, Taylor Berry   Account Number:  [id number]  Date Initiated:  04/12/2013  Documentation initiated by:  [facility]  Subjective/Objective Assessment:   post op mini thoracotomy.     Action/Plan:   Anticipated DC Date:  04/18/2013   Anticipated DC Plan:  HOME W HOME HEALTH SERVICES      DC Planning Services  CM consult      Choice offered to / List presented to:     DME arranged  3-N-1  WALKER - ROLLING  OXYGEN      DME agency  [facility]     HH arranged  HH-2 PT  HH-1 RN  HH-4 NURSE'S AIDE      HH agency  [facility]   Status of service:  Completed, signed off Medicare Important Message given?   (If response is "NO", the following Medicare IM given date fields will be blank) Date Medicare IM given:   Date Additional Medicare IM given:    Discharge Disposition:  HOME W HOME HEALTH SERVICES  Per UR Regulation:  Reviewed for med. necessity/level of care/duration of stay  If discussed at Long Length of Stay Meetings, dates discussed:    Comments:  ContactChaundra, Abreu Son [phone]  04/19/2013 1530 NCM spoke to pt and states she will need RW, 3n1 and suction at home. She has a CPAP but stopped wearing last summer. States it was difficult to sleep with it at night. Notified [facility] of HH orders and DME equipment needed. Isidoro Donning RN CCM Case Mgmt phone [phone]  04-14-13 2pm Avie Arenas, RNBSN - [phone] Patient lives at home alone, has a son but son works all the time.  Lives in [place] also.  has a sister who lives in [place] who had planned to come and be with her but now sisters husband is having some tests and needs to be there.  may come up for a few days but not sure when. Explained to patient that [facility] could come in but they do not stay with her - they would do visists lasting maybe from 30-60 minutes.  Patient again confimed that they could not stay with her.  Informed her that  agencies could do that but that insurance would not pay for it.  Is not interested in that.  Discussed other options, ST rehab facility - not interested in this but felt she has friends that could come in and assist and be with her.  Talked about possibility of HH agencies.  Would like [facility] to follow when discharged. Probable need for [facility] RN,  PT and NA.  May need oxygen. Nurse states is pretty independent now.  walking with assistance.

## 2013-04-20 LAB — CULTURE, RESPIRATORY W GRAM STAIN

## 2013-04-20 NOTE — Progress Notes (Signed)
SATURATION QUALIFICATIONS: (This note is used to comply with regulatory documentation for home oxygen)  Patient Saturations on Room Air at Rest = 96%  Patient Saturations on Room Air while Ambulating = 93%  Patient Saturations on 0 Liters of oxygen while Ambulating = 96%  Please briefly explain why patient needs home oxygen: Feels more comfortable with O2 handy, has anxiety without O2 near when ambulating.

## 2013-04-20 NOTE — Progress Notes (Addendum)
[address]       [place] [address]             [phone]                 8 Days Post-Op Procedure(s) (LRB): VIDEO BRONCHOSCOPY (N/A) VIDEO ASSISTED THORACOSCOPY (VATS)/THOROCOTOMY, WITH LEFT UPPER LOBE WEDGE RESECTION, CHEST WALL BIOPSY  (Left) LYMPH NODE DISSECTION (Left)  LOS: 8 days   Subjective: Feels better, walking better, sats drop with walking  Objective: Vital signs in last 24 hours: Patient Vitals for the past 24 hrs:  BP Temp Temp src Pulse Resp SpO2  04/20/13 0801 - 98 F (36.7 C) Oral - - 97 %  04/20/13 0335 113/48 mmHg 97.6 F (36.4 C) Oral 87 17 96 %  04/19/13 2337 126/56 mmHg 97.8 F (36.6 C) Oral 87 22 96 %  04/19/13 2003 - - - - - 97 %  04/19/13 1958 111/68 mmHg 98 F (36.7 C) Oral 106 26 98 %  04/19/13 1651 108/64 mmHg 98.8 F (37.1 C) Oral 96 26 98 %  04/19/13 1359 - - - - - 100 %  04/19/13 1209 131/51 mmHg 98.7 F (37.1 C) Oral 87 26 94 %  04/19/13 1022 93/80 mmHg 98 F (36.7 C) Oral 96 26 92 %  04/19/13 0900 - - - 97 21 91 %    Filed Weights   04/15/13 0300 04/18/13 0500 04/19/13 0500  Weight: 232 lb 5.8 oz (105.4 kg) 232 lb 9.4 oz (105.5 kg) 234 lb 3.2 oz (106.232 kg)    Hemodynamic parameters for last 24 hours:    Intake/Output from previous day: 04/14 0701 - 04/15 0700 In: 120 [P.O.:120] Out: 600 [Urine:600] Intake/Output this shift:    Scheduled Meds: . aspirin EC  81 mg Oral Daily  . bisacodyl  10 mg Oral Daily  . calcium-vitamin D  1 tablet Oral QODAY  . ciprofloxacin  500 mg Oral BID  . enoxaparin (LOVENOX) injection  40 mg Subcutaneous Q24H  . famotidine  20 mg Oral QHS  . folic acid  1 mg Oral Daily  . guaiFENesin  1,200 mg Oral BID  . irbesartan  150 mg Oral Daily  . levalbuterol  0.63 mg Nebulization TID  . loratadine  10 mg Oral Daily  . pantoprazole  80 mg Oral Daily  . simvastatin  20 mg Oral QHS  . sodium chloride  10-40 mL Intracatheter Q12H  . sodium chloride  3 mL Intravenous Q12H    . tiotropium  18 mcg Inhalation Daily  . venlafaxine XR  75 mg Oral Daily  . vitamin C  250 mg Oral Daily   Continuous Infusions: . dextrose 5 % and 0.45 % NaCl with KCl 10 mEq/L Stopped (04/15/13 1600)   PRN Meds:.acetaminophen, albuterol, chlorpheniramine-HYDROcodone, fluticasone, polyethylene glycol, potassium chloride, senna-docusate, sodium chloride, sodium chloride, traMADol  General appearance: alert and cooperative Neurologic: intact Heart: regular rate and rhythm, S1, S2 normal, no murmur, click, rub or gallop Lungs: diminished breath sounds bibasilar Wound: intact  Lab Results: CBC: Recent Labs  04/18/13 0400 04/19/13 0450  WBC 23.0* 18.6*  HGB 9.2* 8.8*  HCT 27.7* 26.4*  PLT 131* 136*   BMET:  Recent Labs  04/18/13 0400 04/19/13 0450  NA 126* 128*  K 4.0 4.3  CL 88* 89*  CO2 27 26  GLUCOSE 123* 120*  BUN 11 13  CREATININE 0.50 0.57  CALCIUM 8.7 8.6    PT/INR: No  results found for this basename: LABPROT, INR,  in the last 72 hours   Radiology No results found.   Assessment/Plan: S/P Procedure(s) (LRB): VIDEO BRONCHOSCOPY (N/A) VIDEO ASSISTED THORACOSCOPY (VATS)/THOROCOTOMY, WITH LEFT UPPER LOBE WEDGE RESECTION, CHEST WALL BIOPSY  (Left) LYMPH NODE DISSECTION (Left) Poss home in am Follow up chest xray in am   [name] MD 04/20/2013 8:47 AM

## 2013-04-21 ENCOUNTER — Inpatient Hospital Stay (HOSPITAL_COMMUNITY): Payer: Medicare Other

## 2013-04-21 LAB — URINE CULTURE
Colony Count: >=100000
Special Requests: NORMAL

## 2013-04-21 LAB — BASIC METABOLIC PANEL
BUN: 12 mg/dL (ref 6–23)
CO2: 25 mEq/L (ref 19–32)
Calcium: 8.4 mg/dL (ref 8.4–10.5)
Chloride: 85 mEq/L — ABNORMAL LOW (ref 96–112)
Creatinine, Ser: 0.6 mg/dL (ref 0.50–1.10)
GFR calc Af Amer: >90 mL/min (ref >90)
GFR calc non Af Amer: 89 mL/min — ABNORMAL LOW (ref >90)
Glucose, Bld: 114 mg/dL — ABNORMAL HIGH (ref 70–99)
Potassium: 4.5 mEq/L (ref 3.7–5.3)
Sodium: 125 mEq/L — ABNORMAL LOW (ref 137–147)

## 2013-04-21 LAB — CBC
HCT: 27.5 % — ABNORMAL LOW (ref 36.0–46.0)
Hemoglobin: 9.1 g/dL — ABNORMAL LOW (ref 12.0–15.0)
MCH: 29.3 pg (ref 26.0–34.0)
MCHC: 33.1 g/dL (ref 30.0–36.0)
MCV: 88.4 fL (ref 78.0–100.0)
Platelets: 277 10*3/uL (ref 150–400)
RBC: 3.11 MIL/uL — ABNORMAL LOW (ref 3.87–5.11)
RDW: 14.6 % (ref 11.5–15.5)
WBC: 19.5 10*3/uL — ABNORMAL HIGH (ref 4.0–10.5)

## 2013-04-21 MED ORDER — TRAMADOL HCL 50 MG PO TABS: 50.0000 mg | ORAL_TABLET | Freq: Four times a day (QID) | ORAL | Status: DC | PRN

## 2013-04-21 MED ORDER — HYDROCORTISONE 1 % EX CREA
TOPICAL_CREAM | Freq: Two times a day (BID) | CUTANEOUS | Status: DC
  Administered 2013-04-21 – 2013-04-23 (×6): via TOPICAL
  Filled 2013-04-21: qty 28

## 2013-04-21 MED ORDER — HYDROCORTISONE 1 % EX CREA: TOPICAL_CREAM | Freq: Two times a day (BID) | CUTANEOUS | Status: DC

## 2013-04-21 MED ORDER — GUAIFENESIN ER 600 MG PO TB12: 1200.0000 mg | ORAL_TABLET | Freq: Two times a day (BID) | ORAL | Status: DC | PRN

## 2013-04-21 MED ORDER — HYDROCOD POLST-CHLORPHEN POLST 10-8 MG/5ML PO LQCR: 5.0000 mL | Freq: Two times a day (BID) | ORAL | Status: DC | PRN

## 2013-04-21 MED ORDER — CIPROFLOXACIN HCL 500 MG PO TABS: 500.0000 mg | ORAL_TABLET | Freq: Two times a day (BID) | ORAL | Status: DC

## 2013-04-21 NOTE — Progress Notes (Signed)
SATURATION QUALIFICATIONS: (This note is used to comply with regulatory documentation for home oxygen)  Patient Saturations on Room Air at Rest = 96%  Patient Saturations on Room Air while Ambulating = 87%  Patient Saturations on 1 Liters of oxygen while Ambulating = 94%  Please briefly explain why patient needs home oxygen: very SOB while ambulating and SpO2 dropped.

## 2013-04-21 NOTE — Progress Notes (Signed)
Physician notified: Junie Panning, [place] At: [phone]  Regarding: Pt requesting to go to rehab upon DC. No PT consult.  Awaiting return response.   Physician notified: Junie Panning, PA At: [id number]  No return response.

## 2013-04-21 NOTE — Progress Notes (Signed)
Pt had 10 minutes of SpO2 desaturation from 81-88% on room air at 0313 while sleeping.

## 2013-04-21 NOTE — Progress Notes (Addendum)
      [address]       [place] [address]             [phone]      9 Days Post-Op Procedure(s) (LRB): VIDEO BRONCHOSCOPY (N/A) VIDEO ASSISTED THORACOSCOPY (VATS)/THOROCOTOMY, WITH LEFT UPPER LOBE WEDGE RESECTION, CHEST WALL BIOPSY  (Left) LYMPH NODE DISSECTION (Left)  Subjective:  Taylor Berry complains of some incisional pain.  She also has a rash with associated itching on her back.    She does drop into the 70s at night, however she has sleep apnea.  She does not use her CPAP because it is too much pressure.  .  She is ambulating.  + BM  Objective: Vital signs in last 24 hours: Temp:  [97.7 F (36.5 C)-99 F (37.2 C)] 98.2 F (36.8 C) (04/16 0800) Pulse Rate:  [83-100] 89 (04/16 0731) Cardiac Rhythm:  [-] Normal sinus rhythm (04/16 0731) Resp:  [20-29] 20 (04/16 0731) BP: (94-142)/(53-82) 112/82 mmHg (04/16 0731) SpO2:  [94 %-99 %] 95 % (04/16 0731) FiO2 (%):  [97 %] 97 % (04/15 1410)  Intake/Output from previous day: 04/15 0701 - 04/16 0700 In: 600 [P.O.:600] Out: 0   General appearance: alert, cooperative and no distress Heart: regular rate and rhythm Lungs: clear to auscultation bilaterally Abdomen: soft, non-tender; bowel sounds normal; no masses,  no organomegaly Wound: clean and dry  Lab Results:  Recent Labs  04/19/13 0450 04/21/13 0243  WBC 18.6* 19.5*  HGB 8.8* 9.1*  HCT 26.4* 27.5*  PLT 136* 277   BMET:  Recent Labs  04/19/13 0450 04/21/13 0243  NA 128* 125*  K 4.3 4.5  CL 89* 85*  CO2 26 25  GLUCOSE 120* 114*  BUN 13 12  CREATININE 0.57 0.60  CALCIUM 8.6 8.4    PT/INR: No results found for this basename: LABPROT, INR,  in the last 72 hours ABG    Component Value Date/Time   PHART 7.368 04/13/2013 0421   HCO3 27.1* 04/13/2013 0421   TCO2 29 04/13/2013 0421   ACIDBASEDEF 1.0 04/12/2013 1026   O2SAT 96.0 04/13/2013 0421   CBG (last 3)  No results found for this basename: GLUCAP,  in the last 72  hours  Assessment/Plan: S/P Procedure(s) (LRB): VIDEO BRONCHOSCOPY (N/A) VIDEO ASSISTED THORACOSCOPY (VATS)/THOROCOTOMY, WITH LEFT UPPER LOBE WEDGE RESECTION, CHEST WALL BIOPSY  (Left) LYMPH NODE DISSECTION (Left)  1. Pulm- off oxygen, with good oxygen saturations, CXR remains stable no change in left lung consolidation 2. + Haemophilus Influenza in sputum (Beta Lactam positive)- on Cipro, will need to be on Cephalsporin at discharge 3. Urine Culture ordered, however UA was negative on 04/11/13, she likely has chronic asymptomatic bacturia 4. Contact Dermatitis- on back, will order Hydrocortisone cream BID, Benadryl prn 5. Dispo- patient is stable, will plan to discharge home today, will discuss antibiotic regimen with Dr. Servando Snare   LOS: 9 days    Ellwood Handler 04/21/2013  Stats drop when walking as did preop and at night , Nursing to document sat and arrange home o2 Wait on d/c home  To repeat lytes in am,  fluid restriction- 1200 ml I have seen and examined Taylor Berry and agree with the above assessment  and plan.  [name] MD Beeper [phone] Office [phone] 04/21/2013 9:23 AM

## 2013-04-22 DIAGNOSIS — J9600 Acute respiratory failure, unspecified whether with hypoxia or hypercapnia: Secondary | ICD-10-CM

## 2013-04-22 LAB — BASIC METABOLIC PANEL
BUN: 12 mg/dL (ref 6–23)
CO2: 24 mEq/L (ref 19–32)
Calcium: 8.7 mg/dL (ref 8.4–10.5)
Chloride: 89 mEq/L — ABNORMAL LOW (ref 96–112)
Creatinine, Ser: 0.57 mg/dL (ref 0.50–1.10)
GFR calc Af Amer: >90 mL/min (ref >90)
GFR calc non Af Amer: >90 mL/min (ref >90)
Glucose, Bld: 137 mg/dL — ABNORMAL HIGH (ref 70–99)
Potassium: 4.8 mEq/L (ref 3.7–5.3)
Sodium: 125 mEq/L — ABNORMAL LOW (ref 137–147)

## 2013-04-22 LAB — CBC
HCT: 28.8 % — ABNORMAL LOW (ref 36.0–46.0)
Hemoglobin: 9.6 g/dL — ABNORMAL LOW (ref 12.0–15.0)
MCH: 30 pg (ref 26.0–34.0)
MCHC: 33.3 g/dL (ref 30.0–36.0)
MCV: 90 fL (ref 78.0–100.0)
Platelets: 222 10*3/uL (ref 150–400)
RBC: 3.2 MIL/uL — ABNORMAL LOW (ref 3.87–5.11)
RDW: 14.9 % (ref 11.5–15.5)
WBC: 18.5 10*3/uL — ABNORMAL HIGH (ref 4.0–10.5)

## 2013-04-22 MED ORDER — VANCOMYCIN HCL IN DEXTROSE 1-5 GM/200ML-% IV SOLN
1000.0000 mg | Freq: Two times a day (BID) | INTRAVENOUS | Status: DC
  Administered 2013-04-23 – 2013-04-27 (×8): 1000 mg via INTRAVENOUS
  Filled 2013-04-22 (×10): qty 200

## 2013-04-22 MED ORDER — LEVALBUTEROL HCL 0.63 MG/3ML IN NEBU
0.6300 mg | INHALATION_SOLUTION | Freq: Four times a day (QID) | RESPIRATORY_TRACT | Status: DC
  Administered 2013-04-22 – 2013-04-27 (×19): 0.63 mg via RESPIRATORY_TRACT
  Filled 2013-04-22 (×30): qty 3

## 2013-04-22 MED ORDER — BUDESONIDE 0.25 MG/2ML IN SUSP
0.2500 mg | Freq: Four times a day (QID) | RESPIRATORY_TRACT | Status: DC
  Administered 2013-04-22 – 2013-04-27 (×18): 0.25 mg via RESPIRATORY_TRACT
  Filled 2013-04-22 (×24): qty 2

## 2013-04-22 MED ORDER — LEVALBUTEROL HCL 0.63 MG/3ML IN NEBU: 0.6300 mg | INHALATION_SOLUTION | Freq: Four times a day (QID) | RESPIRATORY_TRACT | Status: DC | PRN

## 2013-04-22 MED ORDER — SODIUM CHLORIDE 0.9 % IV SOLN
2000.0000 mg | Freq: Once | INTRAVENOUS | Status: AC
  Administered 2013-04-22: 2000 mg via INTRAVENOUS
  Filled 2013-04-22: qty 2000

## 2013-04-22 MED ORDER — BUDESONIDE 0.25 MG/2ML IN SUSP
0.2500 mg | Freq: Four times a day (QID) | RESPIRATORY_TRACT | Status: DC
  Filled 2013-04-22 (×3): qty 2

## 2013-04-22 MED ORDER — LEVALBUTEROL HCL 0.63 MG/3ML IN NEBU: 0.6300 mg | INHALATION_SOLUTION | RESPIRATORY_TRACT | Status: DC | PRN

## 2013-04-22 MED ORDER — CEFUROXIME AXETIL 500 MG PO TABS
500.0000 mg | ORAL_TABLET | Freq: Two times a day (BID) | ORAL | Status: DC
  Administered 2013-04-22 – 2013-04-26 (×9): 500 mg via ORAL
  Filled 2013-04-22 (×13): qty 1

## 2013-04-22 MED ORDER — FUROSEMIDE 10 MG/ML IJ SOLN
40.0000 mg | Freq: Once | INTRAMUSCULAR | Status: AC
  Administered 2013-04-22: 40 mg via INTRAVENOUS
  Filled 2013-04-22: qty 4

## 2013-04-22 NOTE — Clinical Social Work Placement (Signed)
Clinical Social Work Department CLINICAL SOCIAL WORK PLACEMENT NOTE 04/22/2013  Patient:  Taylor Berry, Taylor Berry  Account Number:  [id number] Admit date:  04/12/2013  Clinical Social Worker:  Kemper Durie, [place]  Date/time:  04/22/2013 03:30 PM  Clinical Social Work is seeking post-discharge placement for this patient at the following level of care:   SKILLED NURSING   (*CSW will update this form in Epic as items are completed)   04/22/2013  Patient/family provided with [facility] Department of Clinical Social Work's list of facilities offering this level of care within the geographic area requested by the patient (or if unable, by the patient's family).  04/22/2013  Patient/family informed of their freedom to choose among providers that offer the needed level of care, that participate in Medicare, Medicaid or managed care program needed by the patient, have an available bed and are willing to accept the patient.  04/22/2013  Patient/family informed of [facility]' ownership interest in [facility], as well as of the fact that they are under no obligation to receive care at this facility.  PASARR submitted to EDS on 04/22/2013 PASARR number received from EDS on 04/22/2013  FL2 transmitted to all facilities in geographic area requested by pt/family on  04/22/2013 FL2 transmitted to all facilities within larger geographic area on   Patient informed that his/her managed care company has contracts with or will negotiate with  certain facilities, including the following:     Patient/family informed of bed offers received:  04/22/2013 Patient chooses bed at [facility] Physician recommends and patient chooses bed at    Patient to be transferred to [place] on   Patient to be transferred to facility by   The following physician request were entered in Epic:   Additional Comments:   Liz Beach MSW, [place], [place], [phone]

## 2013-04-22 NOTE — Clinical Social Work Psychosocial (Signed)
Clinical Social Work Department BRIEF PSYCHOSOCIAL ASSESSMENT 04/22/2013  Patient:  Taylor Berry, Taylor Berry     Account Number:  [id number]     Admit date:  04/12/2013  Clinical Social Worker:  Lovey Newcomer  Date/Time:  04/22/2013 01:00 PM  Referred by:  Physician  Date Referred:  04/22/2013 Referred for  SNF Placement   Other Referral:   Interview type:  Patient Other interview type:   Patient alert and oriented at time of assessment.    PSYCHOSOCIAL DATA Living Status:  ALONE Admitted from facility:   Level of care:   Primary support name:  Eddie Dibbles Primary support relationship to patient:  CHILD, ADULT Degree of support available:   Support is adequate.    CURRENT CONCERNS Current Concerns  Post-Acute Placement   Other Concerns:    SOCIAL WORK ASSESSMENT / PLAN CSW met with patient at bedside. Patient states that she wants to go to SNF prior to returning home because she doesn't feel like she can manage at home alone and can benefit from SNF placement. Original plan was for patient to DC home but PT worked with patient today and has recommended SNF for patient.  Patient states that she has supportive sisters and a supportive son and is agreeable to SNF placement in [facility]. CSW explained SNF search/placement process to patient and answered questions fully.   Assessment/plan status:  Psychosocial Support/Ongoing Assessment of Needs Other assessment/ plan:   Complete FL2, Fax, PASRR   Information/referral to community resources:   CSW contact information and SNF list given to patient.    PATIENT'S/FAMILY'S RESPONSE TO PLAN OF CARE: Patient states that she plans to DC to SNF at discharge. Patient was engaged in assessment and had many questions. She is happy to know that she will be able to receive rehab at SNF prior to returning home. Patient was pleasant, appropriate, and appreciative of CSW visit. CSW will assist with DC.      Liz Beach MSW,  [place], [place], [phone]

## 2013-04-22 NOTE — Evaluation (Signed)
Physical Therapy Evaluation Patient Details Name: Taylor Berry MRN: [id number] DOB: 1940-04-25 Today's Date: 04/22/2013   History of Present Illness  pt rpesents with Vats and LUL Resection.    Clinical Impression  Pt generally weak and deconditioned.  Pt would benefit from ST-SNF to increase independence prior to returning to home alone.  Will continue to follow.      Follow Up Recommendations SNF    Equipment Recommendations  None recommended by PT    Recommendations for Other Services       Precautions / Restrictions Precautions Precautions: Fall Precaution Comments: 1L O2 for ambulation only Restrictions Weight Bearing Restrictions: No      Mobility  Bed Mobility                  Transfers Overall transfer level: Needs assistance Equipment used: Rolling walker (2 wheeled) Transfers: Sit to/from Stand Sit to Stand: Min assist         General transfer comment: cues for Ue use and getting closer to chair prior to sitting.  cues for pursed lip breathing as pt tends to hold her breath.    Ambulation/Gait Ambulation/Gait assistance: Min guard Ambulation Distance (Feet): 120 Feet (and 100 and 150) Assistive device: Rolling walker (2 wheeled) Gait Pattern/deviations: Step-through pattern;Decreased stride length;Shuffle   Gait velocity interpretation: Below normal speed for age/gender General Gait Details: pt moves slowly and shuffles 2/2 fatigue and SOB.  pt needs cueing for pursed lip breathing.  pt took standing rest rbeaks in between each bout of gait.    Stairs            Wheelchair Mobility    Modified Rankin (Stroke Patients Only)       Balance                                             Pertinent Vitals/Pain Indicates pain during coughing in L side.      Home Living Family/patient expects to be discharged to:: Skilled nursing facility Living Arrangements: Alone                    Prior Function Level  of Independence: Independent               Hand Dominance        Extremity/Trunk Assessment   Upper Extremity Assessment: Generalized weakness           Lower Extremity Assessment: Generalized weakness      Cervical / Trunk Assessment: Normal  Communication   Communication: No difficulties  Cognition Arousal/Alertness: Awake/alert Behavior During Therapy: WFL for tasks assessed/performed Overall Cognitive Status: Within Functional Limits for tasks assessed                      General Comments      Exercises        Assessment/Plan    PT Assessment Patient needs continued PT services  PT Diagnosis Difficulty walking;Generalized weakness   PT Problem List Decreased strength;Decreased activity tolerance;Decreased balance;Decreased mobility;Decreased knowledge of use of DME;Cardiopulmonary status limiting activity;Pain  PT Treatment Interventions DME instruction;Gait training;Functional mobility training;Therapeutic activities;Therapeutic exercise;Balance training;Patient/family education   PT Goals (Current goals can be found in the Care Plan section) Acute Rehab PT Goals Patient Stated Goal: Get stronger.   PT Goal Formulation: With patient Time For Goal Achievement: 05/06/13 Potential  to Achieve Goals: Good    Frequency Min 3X/week   Barriers to discharge        Co-evaluation               End of Session Equipment Utilized During Treatment: Oxygen Activity Tolerance: Patient limited by fatigue Patient left: in chair;with call bell/phone within reach;with family/visitor present Nurse Communication: Mobility status         Time: 5027-7412 PT Time Calculation (min): 25 min   Charges:   PT Evaluation $Initial PT Evaluation Tier I: 1 Procedure PT Treatments $Gait Training: 8-22 mins   PT G CodesCatarina Hartshorn, PT [phone] 04/22/2013, 2:50 PM

## 2013-04-22 NOTE — Progress Notes (Signed)
Physician notified: Junie Panning, PA At: 1450  Regarding: pt anxious in general today. Left lateral towards anterior breast red, hot, firm around VATS site.  Awaiting return response.   Returned Response at: 1453  Order(s): Will see patient.

## 2013-04-22 NOTE — Consult Note (Addendum)
PULMONARY / CRITICAL CARE MEDICINE   Name: Taylor Berry MRN: [id number] DOB: [birth year]    ADMISSION DATE:  04/12/2013 CONSULTATION DATE:  4/17  REFERRING MD :  CVTS PRIMARY SERVICE: CVTS  CHIEF COMPLAINT:  DOE/Weak  BRIEF PATIENT DESCRIPTION:  [name], recently retired, former 2 ppd smoker (quit 1990) with OSA (non compliant), COPD (03/28/13: FEV1 0.92 46%, DLCO 11.1 51%), not O2 dependent who had LUL VATS for stage 1 squamous cell carcinoma on 4/8. 4/13 sputum culture is + H. Flu and she has been on long term immunosuppression for RA (stopped prior to surgery).  Currently on cipro. She has remained in a debilitated state and has significant DOE and weakness. PCCM asked to assist in her pulmonary care.   SIGNIFICANT EVENTS / STUDIES:  4/8 LUL VATS  LINES / TUBES:   CULTURES: 4/13 sputum>>H. Flu  ANTIBIOTICS: 4/13 cipro>>  HISTORY OF PRESENT ILLNESS:   [name], recently retired, 2 ppd smoker(quit 1990) with known OSA(non compliant) COPD with PFT 3/23 (FEV1 0.92 46%, DLCO 11.1 51%), not O2 dependent who had LUL vats for stage 1 squamous cell carcinoma on 4/8. 4/13 sputum culture is + H. Flu and she has been on long term immunosuppression for RA(stopped prior to surgery).  Currently on cipro. She has remained in a debilitated state and has significant DOE and weakness. PCCM asked to assist in her pulmonary care.   PAST MEDICAL HISTORY :  Past Medical History  Diagnosis Date  . Hypertension   . Rheumatoid arthritis(714.0)   . Osteoporosis   . Allergic rhinitis   . Hyperlipidemia   . Obesity   . OSA (obstructive sleep apnea)   . Osteoarthritis   . Uterine cancer   . COPD (chronic obstructive pulmonary disease)     cervical cancer  . Shortness of breath   . Cough with expectoration     coughs up phlegm early morning  . GERD (gastroesophageal reflux disease)   . Left bundle branch block    Past Surgical History  Procedure Laterality Date  . Vesicovaginal fistula  closure w/ tah  1971  . Tonsillectomy    . Abdominal hysterectomy    . Colonoscopy w/ biopsies and polypectomy    . Cardiac catheterization  01/27/11    minor non-obs CAD, NL EF, mild pulm HTN  . Video bronchoscopy N/A 04/12/2013    Procedure: VIDEO BRONCHOSCOPY;  Surgeon: [name], MD;  Location: [facility] OR;  Service: Thoracic;  Laterality: N/A;  . Video assisted thoracoscopy (vats)/thorocotomy Left 04/12/2013    Procedure: VIDEO ASSISTED THORACOSCOPY (VATS)/THOROCOTOMY, WITH LEFT UPPER LOBE WEDGE RESECTION, CHEST WALL BIOPSY ;  Surgeon: [name], MD;  Location: [place];  Service: Thoracic;  Laterality: Left;  . Lymph node dissection Left 04/12/2013    Procedure: LYMPH NODE DISSECTION;  Surgeon: [name], MD;  Location: [place];  Service: Thoracic;  Laterality: Left;   Prior to Admission medications   Medication Sig Start Date End Date Taking? Authorizing Provider  albuterol (VENTOLIN HFA) 108 (90 BASE) MCG/ACT inhaler Inhale 2 puffs into the lungs every 6 (six) hours as needed for wheezing or shortness of breath.    Yes Historical Provider, MD  Ascorbic Acid (VITAMIN C PO) Take 1 tablet by mouth daily.   Yes Historical Provider, MD  aspirin EC 81 MG tablet Take 81 mg by mouth daily.   Yes Historical Provider, MD  calcium-vitamin D (OSCAL WITH D) 500-200 MG-UNIT per tablet Take 1  tablet by mouth every other day.   Yes Historical Provider, MD  cetirizine (ZYRTEC) 10 MG tablet Take 10 mg by mouth daily.   Yes Historical Provider, MD  Cholecalciferol (VITAMIN D PO) Take 1 tablet by mouth daily.    Yes Historical Provider, MD  famotidine (PEPCID) 20 MG tablet Take 20 mg by mouth at bedtime. One at bedtime 01/04/13  Yes Tanda Rockers, MD  fluticasone [facility]) 50 MCG/ACT nasal spray Place 2 sprays into the nose 2 (two) times daily as needed for allergies. 03/30/12 04/08/13 Yes Collene Gobble, MD  folic acid (FOLVITE) Q000111Q MCG tablet Take 800 mcg by mouth daily.   Yes Historical Provider,  MD  olmesartan (BENICAR) 40 MG tablet Take 20 mg by mouth daily. 01/27/13  Yes Tanda Rockers, MD  omeprazole (PRILOSEC) 40 MG capsule Take 40 mg by mouth daily.   Yes Historical Provider, MD  simvastatin (ZOCOR) 20 MG tablet Take 20 mg by mouth at bedtime.     Yes Historical Provider, MD  tiotropium (SPIRIVA) 18 MCG inhalation capsule Place 18 mcg into inhaler and inhale daily. 02/28/13  Yes Tammy S Parrett, NP  venlafaxine (EFFEXOR-XR) 75 MG 24 hr capsule Take 75 mg by mouth daily.     Yes Historical Provider, MD  chlorpheniramine-HYDROcodone (TUSSIONEX) 10-8 MG/5ML LQCR Take 5 mLs by mouth every 12 (twelve) hours as needed for cough. 04/21/13   Erin Barrett, PA-C  ciprofloxacin (CIPRO) 500 MG tablet Take 1 tablet (500 mg total) by mouth 2 (two) times daily. For 10 Days 04/21/13   Erin Barrett, PA-C  guaiFENesin (MUCINEX) 600 MG 12 hr tablet Take 2 tablets (1,200 mg total) by mouth 2 (two) times daily as needed. 04/21/13   Erin Barrett, PA-C  hydrocortisone cream 1 % Apply topically 2 (two) times daily. To Rash on back for itching 04/21/13   Erin Barrett, PA-C  traMADol (ULTRAM) 50 MG tablet Take 1 tablet (50 mg total) by mouth every 6 (six) hours as needed for moderate pain. 04/21/13   Erin Barrett, PA-C   Allergies  Allergen Reactions  . Latex     REACTION: itching  . Ace Inhibitors     REACTION: cough  . Hydrochlorothiazide     REACTION: decreased sodium  . Penicillins     REACTION: intolerant due to yeast infection    FAMILY HISTORY:  Family History  Problem Relation Age of Onset  . Lung cancer Mother   . Coronary artery disease Son     MI at age 59   SOCIAL HISTORY:  reports that she quit smoking about 25 years ago. Her smoking use included Cigarettes. She has a 35 pack-year smoking history. She has never used smokeless tobacco. She reports that she does not drink alcohol or use illicit drugs.  REVIEW OF SYSTEMS:  10 point review of system taken, please see HPI for positives and  negatives.   SUBJECTIVE:   VITAL SIGNS: Temp:  [97.2 F (36.2 C)-98.4 F (36.9 C)] 97.2 F (36.2 C) (04/17 1157) Pulse Rate:  [90-96] 95 (04/17 0400) Resp:  [20-26] 26 (04/17 0400) BP: (102-150)/(47-74) 150/64 mmHg (04/17 0400) SpO2:  [94 %-99 %] 97 % (04/17 0827) HEMODYNAMICS:   VENTILATOR SETTINGS:   INTAKE / OUTPUT: Intake/Output     04/16 0701 - 04/17 0700 04/17 0701 - 04/18 0700   P.O. 1200 540   Total Intake(mL/kg) 1200 (11.3) 540 (5.1)   Total Output       Net +1200 +540  Urine Occurrence 7 x 1 x   Stool Occurrence 1 x 1 x     PHYSICAL EXAMINATION: General: Fatigued appearing, mild rest dyspnea Neuro:  Intact HEENT:  No LAN/JVD Cardiovascular: RRR s M Lungs: scattered rhonchi, bibasilar crackles, no wheezes  Abdomen:  Obes + bs Ext: Symmetric BLE edema,   Skin: vague rash on upper back, Surgical scar erythematous and indurated   LABS: I have reviewed all of today's lab results. Relevant abnormalities are discussed in the A/P section  CXR: LLL opacity - has appearance of pseudotumor on lateral projection   ASSESSMENT / PLAN: Acute on chronic respiratory failure Post VATS wedge resection of LUL sq cell ca Baseline COPD and restrictvie physiology due to obesity Chronic cough  OSA H flu bronchitis vs PNA Suspected pseudotumor on L +/- infiltrate Possible component of pulmonary edema LE edema Hyponatremia Nonspecific rash - concern for drug reaction (Cipro) RA without definitive evidence of pulmonary involvement P:   Change BDs to scheduled and PRN Add nebulized steroids Change abx from Cipro to PO cefuroxime and complete 7 days more Add flutter valve Free water restrict Lasix X 1 dose Follow CXR intermittently Agree with plan for Rehab - perhaps as soon as next week  Merton Border, MD ; [facility] service Mobile [phone].  After 5:30 PM or weekends, call [phone]  Seen with: Richardson Landry Minor ACNP Maryanna Shape PCCM Pager [phone] till 3 pm If  no answer page [phone] 04/22/2013, 12:43 PM

## 2013-04-22 NOTE — Progress Notes (Addendum)
      [address]       ,[place] [address]             [phone]      10 Days Post-Op Procedure(s) (LRB): VIDEO BRONCHOSCOPY (N/A) VIDEO ASSISTED THORACOSCOPY (VATS)/THOROCOTOMY, WITH LEFT UPPER LOBE WEDGE RESECTION, CHEST WALL BIOPSY  (Left) LYMPH NODE DISSECTION (Left)  Subjective:  Taylor Berry has no new complaints.  She is now requesting to go to rehab at discharge. Objective: Vital signs in last 24 hours: Temp:  [97.8 F (36.6 C)-98.4 F (36.9 C)] 98.4 F (36.9 C) (04/17 0700) Pulse Rate:  [90-96] 95 (04/17 0400) Cardiac Rhythm:  [-] Normal sinus rhythm (04/17 0400) Resp:  [20-26] 26 (04/17 0400) BP: (102-150)/(47-91) 150/64 mmHg (04/17 0400) SpO2:  [94 %-99 %] 97 % (04/17 0827)  Intake/Output from previous day: 04/16 0701 - 04/17 0700 In: 1200 [P.O.:1200] Out: -   General appearance: alert, cooperative and no distress Heart: regular rate and rhythm Lungs: clear to auscultation bilaterally Abdomen: soft, non-tender; bowel sounds normal; no masses,  no organomegaly Wound: clean and dry  Lab Results:  Recent Labs  04/21/13 0243 04/22/13 0324  WBC 19.5* 18.5*  HGB 9.1* 9.6*  HCT 27.5* 28.8*  PLT 277 222   BMET:  Recent Labs  04/21/13 0243 04/22/13 0324  NA 125* 125*  K 4.5 4.8  CL 85* 89*  CO2 25 24  GLUCOSE 114* 137*  BUN 12 12  CREATININE 0.60 0.57  CALCIUM 8.4 8.7    PT/INR: No results found for this basename: LABPROT, INR,  in the last 72 hours ABG    Component Value Date/Time   PHART 7.368 04/13/2013 0421   HCO3 27.1* 04/13/2013 0421   TCO2 29 04/13/2013 0421   ACIDBASEDEF 1.0 04/12/2013 1026   O2SAT 96.0 04/13/2013 0421   CBG (last 3)  No results found for this basename: GLUCAP,  in the last 72 hours  Assessment/Plan: S/P Procedure(s) (LRB): VIDEO BRONCHOSCOPY (N/A) VIDEO ASSISTED THORACOSCOPY (VATS)/THOROCOTOMY, WITH LEFT UPPER LOBE WEDGE RESECTION, CHEST WALL BIOPSY  (Left) LYMPH NODE DISSECTION (Left)  1. Pulm-  off oxygen, continues to desaturate during sleeping, continue at home inhalers, IS 2. + H. Flu in sputum- on Cipro 3. Hyponatremia, patient on fluid restrictions 4. Contact Dermatitis- continue Hydrocortisone cream 5. PT consult- per patient request wants rehab at discharge 6. Dispo- will ask Pulmonary to see patient, place PT consult   LOS: 10 days    Ellwood Handler 04/22/2013    Left chest wound with increasing erythema tenderness extending anteriorly on breast  suspicious for wound infesction will add vancomycin to current antibiotic coverage of urine and sputum I have seen and examined Taylor Berry and agree with the above assessment  and plan.  [name] MD [place] [phone] Office [phone] 04/22/2013 3:12 PM

## 2013-04-22 NOTE — Progress Notes (Signed)
ANTIBIOTIC CONSULT NOTE - INITIAL  Pharmacy Consult for vancomycin Indication: wound infection  Allergies  Allergen Reactions  . Latex     REACTION: itching  . Ace Inhibitors     REACTION: cough  . Hydrochlorothiazide     REACTION: decreased sodium  . Penicillins     REACTION: intolerant due to yeast infection    Patient Measurements: Height: 5\' 2"  (157.5 cm) Weight: 234 lb 3.2 oz (106.232 kg) IBW/kg (Calculated) : 50.1  Vital Signs: Temp: 97.2 F (36.2 C) (04/17 1157) Temp src: Oral (04/17 1157) BP: 100/45 mmHg (04/17 1153) Pulse Rate: 91 (04/17 1153) Intake/Output from previous day: 04/16 0701 - 04/17 0700 In: 1200 [P.O.:1200] Out: -  Intake/Output from this shift: Total I/O In: 1020 [P.O.:1020] Out: -   Labs:  Recent Labs  04/21/13 0243 04/22/13 0324  WBC 19.5* 18.5*  HGB 9.1* 9.6*  PLT 277 222  CREATININE 0.60 0.57   Estimated Creatinine Clearance: 72.8 ml/min (by C-G formula based on Cr of 0.57). No results found for this basename: VANCOTROUGH, Corlis Leak, VANCORANDOM, GENTTROUGH, GENTPEAK, GENTRANDOM, TOBRATROUGH, TOBRAPEAK, TOBRARND, AMIKACINPEAK, AMIKACINTROU, AMIKACIN,  in the last 72 hours   Microbiology: Recent Results (from the past 720 hour(s))  SURGICAL PCR SCREEN     Status: None   Collection Time    04/11/13  2:58 PM      Result Value Ref Range Status   MRSA, PCR NEGATIVE  NEGATIVE Final   Staphylococcus aureus NEGATIVE  NEGATIVE Final   Comment:            The Xpert SA Assay (FDA     approved for NASAL specimens     in patients over 85 years of age),     is one component of     a comprehensive surveillance     program.  Test performance has     been validated by [employer] for patients greater     than or equal to 40 year old.     It is not intended     to diagnose infection nor to     guide or monitor treatment.  CULTURE, EXPECTORATED SPUTUM-ASSESSMENT     Status: None   Collection Time    04/18/13  9:29 AM      Result  Value Ref Range Status   Specimen Description SPUTUM   Final   Special Requests Normal   Final   Sputum evaluation     Final   Value: THIS SPECIMEN IS ACCEPTABLE. RESPIRATORY CULTURE REPORT TO FOLLOW.   Report Status 04/18/2013 FINAL   Final  CULTURE, RESPIRATORY (NON-EXPECTORATED)     Status: None   Collection Time    04/18/13  9:29 AM      Result Value Ref Range Status   Specimen Description SPUTUM   Final   Special Requests NONE   Final   Gram Stain     Final   Value: ABUNDANT WBC PRESENT,BOTH PMN AND MONONUCLEAR     RARE SQUAMOUS EPITHELIAL CELLS PRESENT     MODERATE GRAM NEGATIVE COCCOBACILLI     RARE GRAM POSITIVE COCCI     IN PAIRS   Culture     Final   Value: ABUNDANT HAEMOPHILUS INFLUENZAE     Note: BETA LACTAMASE POSITIVE     Performed at [facility]   Report Status 04/20/2013 FINAL   Final  URINE CULTURE     Status: None   Collection Time    04/18/13  11:18 AM      Result Value Ref Range Status   Specimen Description URINE, CLEAN CATCH   Final   Special Requests none Normal   Final   Culture  Setup Time     Final   Value: 04/18/2013 21:00     Performed at [facility] Count     Final   Value: >=100,000 COLONIES/ML     Performed at [facility]   Culture     Final   Value: KLEBSIELLA PNEUMONIAE     ESCHERICHIA COLI     Performed at [facility]   Report Status 04/21/2013 FINAL   Final   Organism ID, Bacteria KLEBSIELLA PNEUMONIAE   Final   Organism ID, Bacteria ESCHERICHIA COLI   Final    Medical History: Past Medical History  Diagnosis Date  . Hypertension   . Rheumatoid arthritis(714.0)   . Osteoporosis   . Allergic rhinitis   . Hyperlipidemia   . Obesity   . OSA (obstructive sleep apnea)   . Osteoarthritis   . Uterine cancer   . COPD (chronic obstructive pulmonary disease)     cervical cancer  . Shortness of breath   . Cough with expectoration     coughs up phlegm early morning  . GERD (gastroesophageal  reflux disease)   . Left bundle branch block     Medications:  Scheduled:  . aspirin EC  81 mg Oral Daily  . bisacodyl  10 mg Oral Daily  . budesonide  0.25 mg Nebulization 4 times per day  . calcium-vitamin D  1 tablet Oral QODAY  . cefUROXime  500 mg Oral BID WC  . enoxaparin (LOVENOX) injection  40 mg Subcutaneous Q24H  . famotidine  20 mg Oral QHS  . folic acid  1 mg Oral Daily  . furosemide  40 mg Intravenous Once  . guaiFENesin  1,200 mg Oral BID  . hydrocortisone cream   Topical BID  . irbesartan  150 mg Oral Daily  . levalbuterol  0.63 mg Nebulization Q6H  . loratadine  10 mg Oral Daily  . pantoprazole  80 mg Oral Daily  . simvastatin  20 mg Oral QHS  . sodium chloride  10-40 mL Intracatheter Q12H  . sodium chloride  3 mL Intravenous Q12H  . venlafaxine XR  75 mg Oral Daily  . vitamin C  250 mg Oral Daily   Assessment: 73 year old woman s/p VATS 10 days ago who has a possible wound infection around the chest wound surgical site.  Goal of Therapy:  Vancomycin trough level 10-15 mcg/ml  Plan:  Measure antibiotic drug levels at steady state Follow up culture results 2g IV x 1 dose, the 1g IV q12h Monitor renal function  Gearldine Bienenstock Dayan Kreis 04/22/2013,3:29 PM

## 2013-04-23 LAB — CBC
HCT: 31.6 % — ABNORMAL LOW (ref 36.0–46.0)
Hemoglobin: 10.4 g/dL — ABNORMAL LOW (ref 12.0–15.0)
MCH: 29.7 pg (ref 26.0–34.0)
MCHC: 32.9 g/dL (ref 30.0–36.0)
MCV: 90.3 fL (ref 78.0–100.0)
Platelets: 284 10*3/uL (ref 150–400)
RBC: 3.5 MIL/uL — ABNORMAL LOW (ref 3.87–5.11)
RDW: 15.1 % (ref 11.5–15.5)
WBC: 23.8 10*3/uL — ABNORMAL HIGH (ref 4.0–10.5)

## 2013-04-23 LAB — BASIC METABOLIC PANEL
BUN: 13 mg/dL (ref 6–23)
CO2: 25 mEq/L (ref 19–32)
Calcium: 8.9 mg/dL (ref 8.4–10.5)
Chloride: 90 mEq/L — ABNORMAL LOW (ref 96–112)
Creatinine, Ser: 0.5 mg/dL (ref 0.50–1.10)
GFR calc Af Amer: >90 mL/min (ref >90)
GFR calc non Af Amer: >90 mL/min (ref >90)
Glucose, Bld: 124 mg/dL — ABNORMAL HIGH (ref 70–99)
Potassium: 4.8 mEq/L (ref 3.7–5.3)
Sodium: 129 mEq/L — ABNORMAL LOW (ref 137–147)

## 2013-04-23 MED ORDER — SODIUM CHLORIDE 0.9 % IJ SOLN: 10.0000 mL | INTRAMUSCULAR | Status: DC | PRN

## 2013-04-23 MED ORDER — SODIUM CHLORIDE 0.9 % IJ SOLN
10.0000 mL | Freq: Two times a day (BID) | INTRAMUSCULAR | Status: DC
  Administered 2013-04-23 – 2013-04-27 (×7): 10 mL

## 2013-04-23 NOTE — Progress Notes (Signed)
Pt incision secreting serosanguinous drainage. Gown saturated and changed. Pt afebrile. 4x4 gauze w/paper tape applied. Charise Killian made aware. Ordered to keep dry dressing intact and change prn, CBC w/diff in the morning. Will continue to monitor patient.

## 2013-04-23 NOTE — Progress Notes (Signed)
Peripherally Inserted Central Catheter/Midline Placement  The IV Nurse has discussed with the patient and/or persons authorized to consent for the patient, the purpose of this procedure and the potential benefits and risks involved with this procedure.  The benefits include less needle sticks, lab draws from the catheter and patient may be discharged home with the catheter.  Risks include, but not limited to, infection, bleeding, blood clot (thrombus formation), and puncture of an artery; nerve damage and irregular heat beat.  Alternatives to this procedure were also discussed.  PICC/Midline Placement Documentation        Taylor Berry 04/23/2013, 2:14 PM Consent obtained by staff at 12:30PM

## 2013-04-23 NOTE — Progress Notes (Addendum)
T[facility] DAILY ICU PROGRESS NOTE                   [place].[address]            [place] [address]          [phone]   11 Days Post-Op Procedure(s) (LRB): VIDEO BRONCHOSCOPY (N/A) VIDEO ASSISTED THORACOSCOPY (VATS)/THOROCOTOMY, WITH LEFT UPPER LOBE WEDGE RESECTION, CHEST WALL BIOPSY  (Left) LYMPH NODE DISSECTION (Left)  Total Length of Stay:  LOS: 11 days   Subjective: She is upset emotionally, less SOB/breathing is more comfortable   Objective: Vital signs in last 24 hours: Temp:  [97.2 F (36.2 C)-98.7 F (37.1 C)] 97.9 F (36.6 C) (04/18 0719) Pulse Rate:  [88-100] 88 (04/18 0515) Cardiac Rhythm:  [-] Normal sinus rhythm (04/18 0014) Resp:  [22-29] 27 (04/18 0515) BP: (100-136)/(45-81) 136/81 mmHg (04/18 0515) SpO2:  [93 %-99 %] 96 % (04/18 0515) Weight:  [233 lb 9.6 oz (105.96 kg)] 233 lb 9.6 oz (105.96 kg) (04/18 0510)  Filed Weights   04/18/13 0500 04/19/13 0500 04/23/13 0510  Weight: 232 lb 9.4 oz (105.5 kg) 234 lb 3.2 oz (106.232 kg) 233 lb 9.6 oz (105.96 kg)    Weight change:    Hemodynamic parameters for last 24 hours:    Intake/Output from previous day: 04/17 0701 - 04/18 0700 In: 2080 [P.O.:1380; IV Piggyback:700] Out: 2750 [Urine:2750]  Intake/Output this shift:    Current Meds: Scheduled Meds: . aspirin EC  81 mg Oral Daily  . bisacodyl  10 mg Oral Daily  . budesonide  0.25 mg Nebulization 4 times per day  . calcium-vitamin D  1 tablet Oral QODAY  . cefUROXime  500 mg Oral BID WC  . enoxaparin (LOVENOX) injection  40 mg Subcutaneous Q24H  . famotidine  20 mg Oral QHS  . folic acid  1 mg Oral Daily  . guaiFENesin  1,200 mg Oral BID  . hydrocortisone cream   Topical BID  . irbesartan  150 mg Oral Daily  . levalbuterol  0.63 mg Nebulization Q6H  . loratadine  10 mg Oral Daily  . pantoprazole  80 mg Oral Daily  . simvastatin  20 mg Oral QHS  . sodium chloride  10-40 mL Intracatheter Q12H  . sodium chloride  3 mL Intravenous Q12H   . vancomycin  1,000 mg Intravenous Q12H  . venlafaxine XR  75 mg Oral Daily  . vitamin C  250 mg Oral Daily   Continuous Infusions:  PRN Meds:.acetaminophen, chlorpheniramine-HYDROcodone, fluticasone, levalbuterol, polyethylene glycol, potassium chloride, senna-docusate, sodium chloride, sodium chloride, traMADol  General appearance: alert, cooperative and no distress Heart: regular rate and rhythm Lungs: coarse and dim in bases Abdomen: soft, nontender Extremities: + BLE edema Wound: + cellulitis of incis appears about the same  Lab Results: CBC: Recent Labs  04/22/13 0324 04/23/13 0415  WBC 18.5* 23.8*  HGB 9.6* 10.4*  HCT 28.8* 31.6*  PLT 222 284   BMET:  Recent Labs  04/22/13 0324 04/23/13 0415  NA 125* 129*  K 4.8 4.8  CL 89* 90*  CO2 24 25  GLUCOSE 137* 124*  BUN 12 13  CREATININE 0.57 0.50  CALCIUM 8.7 8.9    PT/INR: No results found for this basename: LABPROT, INR,  in the last 72 hours Radiology: No results found.   Assessment/Plan: S/P Procedure(s) (LRB): VIDEO BRONCHOSCOPY (N/A) VIDEO ASSISTED THORACOSCOPY (VATS)/THOROCOTOMY, WITH LEFT UPPER LOBE WEDGE RESECTION, CHEST WALL BIOPSY  (Left) LYMPH NODE  DISSECTION (Left)   1 unable to obtain IV access- will request Picc line 2 pulm toilet/meds as outlined per pulmonary 3 cont current abx 4 sodium improving  Taylor Berry 04/23/2013 8:49 AM  I have seen and examined the patient and agree with the assessment and plan as outlined.  Rexene Alberts 04/23/2013 10:46 AM

## 2013-04-23 NOTE — Progress Notes (Signed)
PULMONARY / CRITICAL CARE MEDICINE   Name: Taylor Berry MRN: [id number] DOB: Jan 28, 1940    ADMISSION DATE:  04/12/2013 CONSULTATION DATE:  4/17  REFERRING MD :  CVTS PRIMARY SERVICE: CVTS  CHIEF COMPLAINT:  DOE/Weak  BRIEF PATIENT DESCRIPTION:  [name], recently retired, former 2 ppd smoker (quit 1990) with OSA (non compliant), COPD (03/28/13: FEV1 0.92 46%, DLCO 11.1 51%), not O2 dependent who had LUL VATS for stage 1 squamous cell carcinoma on 4/8. 4/13 sputum culture is + H. Flu and she has been on long term immunosuppression for RA (stopped prior to surgery).  Currently on cipro. She has remained in a debilitated state and has significant DOE and weakness. PCCM asked to assist in her pulmonary care.   SIGNIFICANT EVENTS / STUDIES:  4/8 LUL VATS  LINES / TUBES:   CULTURES: 4/13 sputum>>H. Flu  ANTIBIOTICS: 4/13 cipro>>  HISTORY OF PRESENT ILLNESS:   [name], recently retired, 2 ppd smoker(quit 1990) with known OSA(non compliant) COPD with PFT 3/23 (FEV1 0.92 46%, DLCO 11.1 51%), not O2 dependent who had LUL vats for stage 1 squamous cell carcinoma on 4/8. 4/13 sputum culture is + H. Flu and she has been on long term immunosuppression for RA(stopped prior to surgery).  Currently on cipro. She has remained in a debilitated state and has significant DOE and weakness. PCCM asked to assist in her pulmonary care.     SUBJECTIVE: Expresses concern about edema in feet, contact rash on side attributed to tape and wants to get PICC placed to reduce sticks. Reports able to ambulate with O2 sat maintained on room air.  VITAL SIGNS: Temp:  [97.2 F (36.2 C)-98.7 F (37.1 C)] 97.9 F (36.6 C) (04/18 0719) Pulse Rate:  [88-100] 88 (04/18 0515) Resp:  [22-29] 27 (04/18 0515) BP: (100-136)/(45-81) 136/81 mmHg (04/18 0515) SpO2:  [93 %-99 %] 96 % (04/18 0515) Weight:  [233 lb 9.6 oz (105.96 kg)] 233 lb 9.6 oz (105.96 kg) (04/18 0510) HEMODYNAMICS:   VENTILATOR SETTINGS:   INTAKE  / OUTPUT: Intake/Output     04/17 0701 - 04/18 0700 04/18 0701 - 04/19 0700   P.O. 1380    IV Piggyback 700    Total Intake(mL/kg) 2080 (19.6)    Urine (mL/kg/hr) 2750 (1.1)    Total Output 2750     Net -670          Urine Occurrence 1 x    Stool Occurrence 2 x      PHYSICAL EXAMINATION: General: Fatigued appearing, mild rest dyspnea up in chair-room air sats 94% Neuro:  Intact HEENT:  No LAN/JVD Cardiovascular: RRR s M Lungs: scattered rhonchi, bibasilar crackles, no wheezes, shallow Abdomen:  Obese + bs Ext: Symmetric BLE edema 2+ Skin: Macular raised rash L flank, Surgical scar erythematous and indurated   LABS: reviewed  CXR: Reviewed LLL opacity - has appearance of pseudotumor on lateral projection. Consolidated.   ASSESSMENT / PLAN: Acute on chronic respiratory failure Post VATS wedge resection of LUL sq cell ca Baseline COPD and restrictive physiology due to obesity- evident sitting in chair Chronic cough - not demonstrated at this visit OSA H flu bronchitis vs PNA Suspected pseudotumor on L +/- infiltrate Possible component of pulmonary edema LE edema Hyponatremia Nonspecific rash - concern for drug reaction (Cipro). Favor contact rash from tape- very localized RA without definitive evidence of pulmonary involvement, Preop immunosuppressive Rx P:   Changed BDs to scheduled and PRN Added nebulized steroids Changed abx  from Cipro to PO cefuroxime and complete 7 days more Added flutter valve- she is using Free water restrict Lasix X 1 dose Follow CXR intermittently Agree with plan for Rehab - perhaps as soon as next week  CD Annamaria Boots, MD ; [facility] service Mobile [phone].  After 5:30 PM or weekends, call [phone]   04/23/2013, 8:46 AM

## 2013-04-23 NOTE — Progress Notes (Signed)
Dear Doctor:  This patient has been identified as a candidate for PICC for the following reason (s): poor veins/poor circulatory system (CHF, COPD, emphysema, diabetes, steroid use, IV drug abuse, etc.) If you agree, please write an order for the indicated device. For any questions contact the Vascular Access Team at [phone] if no answer, please leave a message.  Thank you for supporting the early vascular access assessment program.

## 2013-04-24 ENCOUNTER — Inpatient Hospital Stay (HOSPITAL_COMMUNITY): Payer: Medicare Other

## 2013-04-24 LAB — CBC WITH DIFFERENTIAL/PLATELET
Basophils Absolute: 0.2 10*3/uL — ABNORMAL HIGH (ref 0.0–0.1)
Basophils Relative: 1 % (ref 0–1)
Eosinophils Absolute: 0.2 10*3/uL (ref 0.0–0.7)
Eosinophils Relative: 1 % (ref 0–5)
HCT: 28.4 % — ABNORMAL LOW (ref 36.0–46.0)
Hemoglobin: 9.2 g/dL — ABNORMAL LOW (ref 12.0–15.0)
Lymphocytes Relative: 13 % (ref 12–46)
Lymphs Abs: 2.4 10*3/uL (ref 0.7–4.0)
MCH: 29.2 pg (ref 26.0–34.0)
MCHC: 32.4 g/dL (ref 30.0–36.0)
MCV: 90.2 fL (ref 78.0–100.0)
Monocytes Absolute: 2.2 10*3/uL — ABNORMAL HIGH (ref 0.1–1.0)
Monocytes Relative: 12 % (ref 3–12)
Neutro Abs: 13.1 10*3/uL — ABNORMAL HIGH (ref 1.7–7.7)
Neutrophils Relative %: 73 % (ref 43–77)
Platelets: ADEQUATE 10*3/uL (ref 150–400)
RBC: 3.15 MIL/uL — ABNORMAL LOW (ref 3.87–5.11)
RDW: 15.2 % (ref 11.5–15.5)
WBC: 18.1 10*3/uL — ABNORMAL HIGH (ref 4.0–10.5)

## 2013-04-24 NOTE — Progress Notes (Addendum)
Pt incision secreting large amt serosanguinous drainage. Old dressing saturated with drainage, removed. Incision peeling & red, warm, hard around site. New dressing applied (4x4gauze and paper tape). Gown and bedding changed due to saturation of secretions.

## 2013-04-24 NOTE — Progress Notes (Addendum)
T[facility] DAILY ICU PROGRESS NOTE                   Taylor Berry.[address]            [place] [address]          [phone]   12 Days Post-Op Procedure(s) (LRB): VIDEO BRONCHOSCOPY (N/A) VIDEO ASSISTED THORACOSCOPY (VATS)/THOROCOTOMY, WITH LEFT UPPER LOBE WEDGE RESECTION, CHEST WALL BIOPSY  (Left) LYMPH NODE DISSECTION (Left)  Total Length of Stay:  LOS: 12 days   Subjective: Now having a large amy of drainage from the incision  Objective: Vital signs in last 24 hours: Temp:  [97.4 F (36.3 C)-98.1 F (36.7 C)] 97.4 F (36.3 C) (04/19 0718) Pulse Rate:  [69-100] 69 (04/19 0430) Cardiac Rhythm:  [-] Normal sinus rhythm (04/19 0430) Resp:  [21-25] 23 (04/19 0430) BP: (112-141)/(68-83) 138/68 mmHg (04/19 0430) SpO2:  [95 %-100 %] 100 % (04/19 0816) FiO2 (%):  [21 %-28 %] 28 % (04/19 0222) Weight:  [242 lb 8.1 oz (110 kg)] 242 lb 8.1 oz (110 kg) (04/19 0430)  Filed Weights   04/19/13 0500 04/23/13 0510 04/24/13 0430  Weight: 234 lb 3.2 oz (106.232 kg) 233 lb 9.6 oz (105.96 kg) 242 lb 8.1 oz (110 kg)    Weight change: 8 lb 14.5 oz (4.04 kg)   Hemodynamic parameters for last 24 hours:    Intake/Output from previous day: 04/18 0701 - 04/19 0700 In: 720 [P.O.:720] Out: 900 [Urine:900]  Intake/Output this shift: Total I/O In: 383 [P.O.:360; I.V.:23] Out: 1100 [Urine:1100]  Current Meds: Scheduled Meds: . aspirin EC  81 mg Oral Daily  . bisacodyl  10 mg Oral Daily  . budesonide  0.25 mg Nebulization 4 times per day  . calcium-vitamin D  1 tablet Oral QODAY  . cefUROXime  500 mg Oral BID WC  . enoxaparin (LOVENOX) injection  40 mg Subcutaneous Q24H  . famotidine  20 mg Oral QHS  . folic acid  1 mg Oral Daily  . guaiFENesin  1,200 mg Oral BID  . hydrocortisone cream   Topical BID  . irbesartan  150 mg Oral Daily  . levalbuterol  0.63 mg Nebulization Q6H  . loratadine  10 mg Oral Daily  . pantoprazole  80 mg Oral Daily  . simvastatin  20 mg Oral QHS  .  sodium chloride  10-40 mL Intracatheter Q12H  . sodium chloride  10-40 mL Intracatheter Q12H  . sodium chloride  3 mL Intravenous Q12H  . vancomycin  1,000 mg Intravenous Q12H  . venlafaxine XR  75 mg Oral Daily  . vitamin C  250 mg Oral Daily   Continuous Infusions:  PRN Meds:.acetaminophen, chlorpheniramine-HYDROcodone, fluticasone, levalbuterol, polyethylene glycol, potassium chloride, senna-docusate, sodium chloride, sodium chloride, sodium chloride, traMADol  General appearance: alert, cooperative and no distress Heart: regular rate and rhythm Lungs: dim in lower firlds, some mild crackles Abdomen: soft Extremities: + edema Wound: +sero-sang drainage from thoracotomy incision The erethema appears about the same Lab Results: CBC: Recent Labs  04/23/13 0415 04/24/13 0500  WBC 23.8* 18.1*  HGB 10.4* 9.2*  HCT 31.6* 28.4*  PLT 284 PLATELET CLUMPS NOTED ON SMEAR, COUNT APPEARS ADEQUATE   BMET:  Recent Labs  04/22/13 0324 04/23/13 0415  NA 125* 129*  K 4.8 4.8  CL 89* 90*  CO2 24 25  GLUCOSE 137* 124*  BUN 12 13  CREATININE 0.57 0.50  CALCIUM 8.7 8.9    PT/INR: No results found for  this basename: LABPROT, INR,  in the last 72 hours Radiology: No results found.   Assessment/Plan: S/P Procedure(s) (LRB): VIDEO BRONCHOSCOPY (N/A) VIDEO ASSISTED THORACOSCOPY (VATS)/THOROCOTOMY, WITH LEFT UPPER LOBE WEDGE RESECTION, CHEST WALL BIOPSY  (Left) LYMPH NODE DISSECTION (Left)  1 now with wound drainage in addition to the cellulitis, appears serosang and not purulent. The leukocytosis is improved. Picc line in and getting vanco.  2 pulmonology assisting with multiple chronic lung issues as well as the acute bronchitis/pneumonia   Taylor Berry 04/24/2013 11:35 AM  I have seen and examined the patient and agree with the assessment and plan as outlined.  Chest wound is now draining a fair amount of fluid.  By report the initial drainage last night was cloudy/milky in  appearance, but it now appears thin and serous.  It is spontaneously draining through the incision and the surrounding erythema is stable or slightly improved. Will get swab culture and observe for now.  It may need to be opened if it doesn't improve.  Taylor Berry 04/24/2013 7:23 PM

## 2013-04-24 NOTE — Progress Notes (Signed)
PULMONARY / CRITICAL CARE MEDICINE   Name: Taylor Berry MRN: [id number] DOB: 08-24-1940    ADMISSION DATE:  04/12/2013 CONSULTATION DATE:  4/17  REFERRING MD :  CVTS PRIMARY SERVICE: CVTS  CHIEF COMPLAINT:  DOE/Weak  BRIEF PATIENT DESCRIPTION:  73 yo Berry, recently retired, former 2 ppd smoker (quit 1990) with OSA (non compliant), COPD (03/28/13: FEV1 0.92 46%, DLCO 11.1 51%), not O2 dependent who had LUL VATS for stage 1 squamous cell carcinoma on 4/8. 4/13 sputum culture is + H. Flu and she has been on long term immunosuppression for RA (stopped prior to surgery).  Currently on cipro. She has remained in a debilitated state and has significant DOE and weakness. PCCM asked to assist in her pulmonary care.   SIGNIFICANT EVENTS / STUDIES:  4/8 LUL VATS  LINES / TUBES:   CULTURES: 4/13 sputum>>H. Flu  ANTIBIOTICS: 4/13 cipro>>  HISTORY OF PRESENT ILLNESS:   Taylor Berry, recently retired, 2 ppd smoker(quit 1990) with known OSA(non compliant) COPD with PFT 3/23 (FEV1 0.92 46%, DLCO 11.1 51%), not O2 dependent who had LUL vats for stage 1 squamous cell carcinoma on 4/8. 4/13 sputum culture is + H. Flu and she has been on long term immunosuppression for RA(stopped prior to surgery).  Currently on cipro. She has remained in a debilitated state and has significant DOE and weakness. PCCM asked to assist in her pulmonary care.     SUBJECTIVE: Walking limited by PICC placement and draining incision yest, but feels breathing improving.  VITAL SIGNS: Temp:  [97.4 F (36.3 C)-98.1 F (36.7 C)] 97.4 F (36.3 C) (04/19 0718) Pulse Rate:  [69-100] 69 (04/19 0430) Resp:  [21-26] 23 (04/19 0430) BP: (112-141)/(60-83) 138/68 mmHg (04/19 0430) SpO2:  [95 %-100 %] 100 % (04/19 0816) FiO2 (%):  [21 %-28 %] 28 % (04/19 0222) Weight:  [242 lb 8.1 oz (110 kg)] 242 lb 8.1 oz (110 kg) (04/19 0430) HEMODYNAMICS:   VENTILATOR SETTINGS: Vent Mode:  [-]  FiO2 (%):  [21 %-28 %] 28 % INTAKE /  OUTPUT: Intake/Output     04/18 0701 - 04/19 0700 04/19 0701 - 04/20 0700   P.O. 720 120   IV Piggyback     Total Intake(mL/kg) 720 (6.5) 120 (1.1)   Urine (mL/kg/hr) 900 (0.3) 300 (1)   Total Output 900 300   Net -180 -180        Urine Occurrence 1 x      PHYSICAL EXAMINATION: General: Fatigued appearing, mild rest tachypnea up in chair-room air sats 94% Neuro:  Intact HEENT:  No LAN/JVD Cardiovascular: RRR s M Lungs: diminished but no rhonchi or wheeze Abdomen:  Obese + bs Ext: Symmetric BLE edema 2+ Skin: rash faded. Dressing over incision  LABS: reviewed  CXR: None since 4/16   ASSESSMENT / PLAN: Acute on chronic respiratory failure Post VATS wedge resection of LUL sq cell ca Baseline COPD and restrictive physiology due to obesity- evident sitting in chair Chronic cough - not demonstrated at this visit OSA H flu bronchitis vs PNA Suspected pseudotumor on L +/- infiltrate Possible component of pulmonary edema LE edema Hyponatremia Nonspecific rash - concern for drug reaction (Cipro). Favor contact rash from tape- very localized RA without definitive evidence of pulmonary involvement, Preop immunosuppressive Rx P:   Changed abx from Cipro to PO cefuroxime and complete 7 days  Added flutter valve- she is using Free water restrict Follow CXR intermittently> ordered Agree with plan for Rehab - perhaps as  soon as next week  CD Annamaria Boots, MD ; [facility] [phone].  After 5:30 PM or weekends, call [phone]   04/24/2013, 9:39 AM

## 2013-04-25 ENCOUNTER — Encounter: Payer: Self-pay | Admitting: *Deleted

## 2013-04-25 LAB — CBC
HCT: 27.6 % — ABNORMAL LOW (ref 36.0–46.0)
Hemoglobin: 9 g/dL — ABNORMAL LOW (ref 12.0–15.0)
MCH: 29.6 pg (ref 26.0–34.0)
MCHC: 32.6 g/dL (ref 30.0–36.0)
MCV: 90.8 fL (ref 78.0–100.0)
Platelets: 267 10*3/uL (ref 150–400)
RBC: 3.04 MIL/uL — ABNORMAL LOW (ref 3.87–5.11)
RDW: 15 % (ref 11.5–15.5)
WBC: 10.5 10*3/uL (ref 4.0–10.5)

## 2013-04-25 LAB — BASIC METABOLIC PANEL
BUN: 9 mg/dL (ref 6–23)
CO2: 26 mEq/L (ref 19–32)
Calcium: 8.4 mg/dL (ref 8.4–10.5)
Chloride: 93 mEq/L — ABNORMAL LOW (ref 96–112)
Creatinine, Ser: 0.44 mg/dL — ABNORMAL LOW (ref 0.50–1.10)
GFR calc Af Amer: >90 mL/min (ref >90)
GFR calc non Af Amer: >90 mL/min (ref >90)
Glucose, Bld: 119 mg/dL — ABNORMAL HIGH (ref 70–99)
Potassium: 4.4 mEq/L (ref 3.7–5.3)
Sodium: 130 mEq/L — ABNORMAL LOW (ref 137–147)

## 2013-04-25 MED ORDER — FERROUS SULFATE 325 (65 FE) MG PO TABS
325.0000 mg | ORAL_TABLET | Freq: Every day | ORAL | Status: DC
  Administered 2013-04-26 – 2013-04-28 (×3): 325 mg via ORAL
  Filled 2013-04-25 (×4): qty 1

## 2013-04-25 NOTE — Progress Notes (Signed)
Chaplain responded to referral from nurse. 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 Music seems to be a big source of comfort and strength for her. We discussed ways to experience music here at the hospital. Pt received phone call so chaplain left. Will follow up as necessary.

## 2013-04-25 NOTE — [facility] Oncology Navigator Note (Signed)
I called patient to f/u her progress since surgery.  Patient continues to be in the hospital following complication of H. Flu infection and surgical wound infection.  Patient reports that she does not feel she is making much progress.  She denied any questions or needs for my assistance at this time.  She asked me to call back next week.  I encouraged her to call me for any questions or concerns she may have.  Patient verbalized understanding.

## 2013-04-25 NOTE — Progress Notes (Signed)
ANTIBIOTIC CONSULT NOTE - follow up Pharmacy Consult for vancomycin Indication: wound infection  Allergies  Allergen Reactions  . Latex     REACTION: itching  . Ace Inhibitors     REACTION: cough  . Hydrochlorothiazide     REACTION: decreased sodium  . Penicillins     REACTION: intolerant due to yeast infection    Patient Measurements: Height: 5\' 2"  (272.5 cm) Weight: 238 lb 12.1 oz (108.3 kg) IBW/kg (Calculated) : 50.1  Vital Signs: Temp: 97.7 F (36.5 C) (04/20 1119) Temp src: Oral (04/20 1119) BP: 138/94 mmHg (04/20 0736) Pulse Rate: 73 (04/20 0736) Intake/Output from previous day: 04/19 0701 - 04/20 0700 In: 683 [P.O.:660; I.V.:23] Out: 2000 [Urine:2000] Intake/Output from this shift: Total I/O In: 360 [P.O.:360] Out: 350 [Urine:350]  Labs:  Recent Labs  04/23/13 0415 04/24/13 0500 04/25/13 1040  WBC 23.8* 18.1* 10.5  HGB 10.4* 9.2* 9.0*  PLT 284 PLATELET CLUMPS NOTED ON SMEAR, COUNT APPEARS ADEQUATE 267  CREATININE 0.50  --  0.44*   Estimated Creatinine Clearance: 73.7 ml/min (by C-G formula based on Cr of 0.44). No results found for this basename: VANCOTROUGH, Corlis Leak, VANCORANDOM, GENTTROUGH, GENTPEAK, GENTRANDOM, TOBRATROUGH, TOBRAPEAK, TOBRARND, AMIKACINPEAK, AMIKACINTROU, AMIKACIN,  in the last 72 hours   Microbiology: Recent Results (from the past 720 hour(s))  SURGICAL PCR SCREEN     Status: None   Collection Time    04/11/13  2:58 PM      Result Value Ref Range Status   MRSA, PCR NEGATIVE  NEGATIVE Final   Staphylococcus aureus NEGATIVE  NEGATIVE Final   Comment:            The Xpert SA Assay (FDA     approved for NASAL specimens     in patients over 52 years of age),     is one component of     a comprehensive surveillance     program.  Test performance has     been validated by [employer] for patients greater     than or equal to 73 year old.     It is not intended     to diagnose infection nor to     guide or monitor  treatment.  CULTURE, EXPECTORATED SPUTUM-ASSESSMENT     Status: None   Collection Time    04/18/13  9:29 AM      Result Value Ref Range Status   Specimen Description SPUTUM   Final   Special Requests Normal   Final   Sputum evaluation     Final   Value: THIS SPECIMEN IS ACCEPTABLE. RESPIRATORY CULTURE REPORT TO FOLLOW.   Report Status 04/18/2013 FINAL   Final  CULTURE, RESPIRATORY (NON-EXPECTORATED)     Status: None   Collection Time    04/18/13  9:29 AM      Result Value Ref Range Status   Specimen Description SPUTUM   Final   Special Requests NONE   Final   Gram Stain     Final   Value: ABUNDANT WBC PRESENT,BOTH PMN AND MONONUCLEAR     RARE SQUAMOUS EPITHELIAL CELLS PRESENT     MODERATE GRAM NEGATIVE COCCOBACILLI     RARE GRAM POSITIVE COCCI     IN PAIRS   Culture     Final   Value: ABUNDANT HAEMOPHILUS INFLUENZAE     Note: BETA LACTAMASE POSITIVE     Performed at [facility]   Report Status 04/20/2013 FINAL   Final  URINE CULTURE     Status: None   Collection Time    04/18/13 11:18 AM      Result Value Ref Range Status   Specimen Description URINE, CLEAN CATCH   Final   Special Requests none Normal   Final   Culture  Setup Time     Final   Value: 04/18/2013 21:00     Performed at [facility] Count     Final   Value: >=100,000 COLONIES/ML     Performed at [facility]   Culture     Final   Value: KLEBSIELLA PNEUMONIAE     ESCHERICHIA COLI     Performed at [facility]   Report Status 04/21/2013 FINAL   Final   Organism ID, Bacteria KLEBSIELLA PNEUMONIAE   Final   Organism ID, Bacteria ESCHERICHIA COLI   Final  WOUND CULTURE     Status: None   Collection Time    04/24/13  7:41 PM      Result Value Ref Range Status   Specimen Description WOUND LEFT CHEST DRAINAGE   Final   Special Requests NONE   Final   Gram Stain     Final   Value: NO WBC SEEN     NO SQUAMOUS EPITHELIAL CELLS SEEN     RARE GRAM NEGATIVE RODS      Performed at [facility]   Culture PENDING   Incomplete   Report Status PENDING   Incomplete    Assessment: 73 year old woman s/p VATS 13 days ago on vancomycin day # 4 and PO ceftin day # 4.   Possible wound infection as surgical site on left chest wound.  Per pt report drainage is less from site.  WBC coming down: 23.8>18.1>10.5.  Afebrile.  4/19 culture from left chest drainage =rare GNRs.  Creat stable at 0.44.   vanc 4/17>> ceftin 4/17>> cipro 4/13>>4/17 stopped due to rash on back  4/13 urine Klebsiella and E coli 4/13 sputum H flu 4/19 L chest drainage: rare GNR   Goal of Therapy:  Vancomycin trough level 10-15 mcg/ml  Plan:  1. Continue vancomycin 1 gm IV q12h 2. Consider checking a vanc trough soon if vanc continues 3. F/u GNRs in 4/19 wound cx 4. F/u renal fxn, wbc, temp, clinical course  Eudelia Bunch, Pharm.D. [phone] 04/25/2013 11:39 AM

## 2013-04-25 NOTE — Progress Notes (Addendum)
      [address]       [place] [address]             [phone]       13 Days Post-Op Procedure(s) (LRB): VIDEO BRONCHOSCOPY (N/A) VIDEO ASSISTED THORACOSCOPY (VATS)/THOROCOTOMY, WITH LEFT UPPER LOBE WEDGE RESECTION, CHEST WALL BIOPSY  (Left) LYMPH NODE DISSECTION (Left)  Subjective: Patient states drainage from left wound seems to have decreased  Objective: Vital signs in last 24 hours: Temp:  [97.2 F (36.2 C)-98.2 F (36.8 C)] 97.6 F (36.4 C) (04/20 0736) Pulse Rate:  [73-87] 77 (04/20 0316) Cardiac Rhythm:  [-] Normal sinus rhythm (04/20 0316) Resp:  [19-27] 27 (04/20 0316) BP: (96-130)/(49-84) 123/84 mmHg (04/20 0316) SpO2:  [96 %-100 %] 100 % (04/20 0316) FiO2 (%):  [21 %] 21 % (04/19 2055) Weight:  [238 lb 12.1 oz (108.3 kg)] 238 lb 12.1 oz (108.3 kg) (04/20 0316)      Intake/Output from previous day: 04/19 0701 - 04/20 0700 In: 683 [P.O.:660; I.V.:23] Out: 2875 [Urine:2875]   Physical Exam:  Cardiovascular: RRR Pulmonary: Diminished at bases; no rales, wheezes, or rhonchi. Abdomen: Soft, non tender, bowel sounds present. Extremities:Bilateral lower extremity edema. Wounds: Drainage on dressing appears to be sero sanguinous. Minor surrounding erythema. Wound is not dehisced.   Lab Results: CBC: Recent Labs  04/23/13 0415 04/24/13 0500  WBC 23.8* 18.1*  HGB 10.4* 9.2*  HCT 31.6* 28.4*  PLT 284 PLATELET CLUMPS NOTED ON SMEAR, COUNT APPEARS ADEQUATE   BMET:  Recent Labs  04/23/13 0415  NA 129*  K 4.8  CL 90*  CO2 25  GLUCOSE 124*  BUN 13  CREATININE 0.50  CALCIUM 8.9    PT/INR: No results found for this basename: LABPROT, INR,  in the last 72 hours ABG:  INR: Will add last result for INR, ABG once components are confirmed Will add last 4 CBG results once components are confirmed  Assessment/Plan:  1. CV - SR 2.  Pulmonary - Encourage incentive spirometer 3. Left chest wound-continue vancomycin. Await wound  culture results. WBC decreasing from 23.8 to 18.1. Will recheck today 4. Anemia-H and H decreased to 9.2 and 28.4. Start iron and continue folic acid 5. Hyponatremia-will check BMET this am  Taylor M ZimmermanPA-C 04/25/2013,7:52 AM  Feels better today WBC now 10,000 Hyponatremia is improving Wound not draining now and decreased  Erythremia I have seen and examined Taylor Berry and agree with the above assessment  and plan.  [name] MD Beeper [phone] Office [phone] 04/25/2013 5:27 PM

## 2013-04-25 NOTE — Progress Notes (Signed)
Utilization review completed.  

## 2013-04-25 NOTE — Progress Notes (Signed)
PULMONARY / CRITICAL CARE MEDICINE   Name: Taylor Berry MRN: [id number] DOB: [birth year]    ADMISSION DATE:  04/12/2013 CONSULTATION DATE:  4/17  REFERRING MD :  CVTS PRIMARY SERVICE: CVTS  CHIEF COMPLAINT:  DOE/Weak  BRIEF PATIENT DESCRIPTION:  [name], recently retired, former 2 ppd smoker (quit 1990) with OSA (non compliant), COPD (03/28/13: FEV1 0.92 46%, DLCO 11.1 51%), not O2 dependent who had LUL VATS for stage 1 squamous cell carcinoma on 4/8. 4/13 sputum culture is + H. Flu and she has been on long term immunosuppression for RA (stopped prior to surgery).  Currently on cipro. She has remained in a debilitated state and has significant DOE and weakness. PCCM asked to assist in her pulmonary care.   SIGNIFICANT EVENTS / STUDIES:  4/8 LUL VATS  LINES / TUBES:   CULTURES: 4/13 sputum>>H. Flu  Results for orders placed during the hospital encounter of 04/12/13  CULTURE, EXPECTORATED SPUTUM-ASSESSMENT     Status: None   Collection Time    04/18/13  9:29 AM      Result Value Ref Range Status   Specimen Description SPUTUM   Final   Special Requests Normal   Final   Sputum evaluation     Final   Value: THIS SPECIMEN IS ACCEPTABLE. RESPIRATORY CULTURE REPORT TO FOLLOW.   Report Status 04/18/2013 FINAL   Final  CULTURE, RESPIRATORY (NON-EXPECTORATED)     Status: None   Collection Time    04/18/13  9:29 AM      Result Value Ref Range Status   Specimen Description SPUTUM   Final   Special Requests NONE   Final   Gram Stain     Final   Value: ABUNDANT WBC PRESENT,BOTH PMN AND MONONUCLEAR     RARE SQUAMOUS EPITHELIAL CELLS PRESENT     MODERATE GRAM NEGATIVE COCCOBACILLI     RARE GRAM POSITIVE COCCI     IN PAIRS   Culture     Final   Value: ABUNDANT HAEMOPHILUS INFLUENZAE     Note: BETA LACTAMASE POSITIVE     Performed at [facility]   Report Status 04/20/2013 FINAL   Final  URINE CULTURE     Status: None   Collection Time    04/18/13 11:18 AM      Result  Value Ref Range Status   Specimen Description URINE, CLEAN CATCH   Final   Special Requests none Normal   Final   Culture  Setup Time     Final   Value: 04/18/2013 21:00     Performed at [facility] Count     Final   Value: >=100,000 COLONIES/ML     Performed at [facility]   Culture     Final   Value: KLEBSIELLA PNEUMONIAE     ESCHERICHIA COLI     Performed at [facility]   Report Status 04/21/2013 FINAL   Final   Organism ID, Bacteria KLEBSIELLA PNEUMONIAE   Final   Organism ID, Bacteria ESCHERICHIA COLI   Final  WOUND CULTURE     Status: None   Collection Time    04/24/13  7:41 PM      Result Value Ref Range Status   Specimen Description WOUND LEFT CHEST DRAINAGE   Final   Special Requests NONE   Final   Gram Stain     Final   Value: NO WBC SEEN     NO SQUAMOUS EPITHELIAL CELLS SEEN  RARE GRAM NEGATIVE RODS     Performed at [facility]   Culture PENDING   Incomplete   Report Status PENDING   Incomplete     ANTIBIOTICS:  Anti-infectives   Start     Dose/Rate Route Frequency Ordered Stop   04/23/13 0600  vancomycin (VANCOCIN) IVPB 1000 mg/200 mL premix     1,000 mg 200 mL/hr over 60 Minutes Intravenous Every 12 hours 04/22/13 1538     04/22/13 1700  cefUROXime (CEFTIN) tablet 500 mg     500 mg Oral 2 times daily with meals 04/22/13 1450 04/29/13 1659   04/22/13 1600  vancomycin (VANCOCIN) 2,000 mg in sodium chloride 0.9 % 500 mL IVPB     2,000 mg 250 mL/hr over 120 Minutes Intravenous  Once 04/22/13 1537 04/22/13 1913   04/21/13 0000  ciprofloxacin (CIPRO) 500 MG tablet     500 mg Oral 2 times daily 04/21/13 0852     04/18/13 0800  ciprofloxacin (CIPRO) tablet 500 mg  Status:  Discontinued     500 mg Oral 2 times daily 04/18/13 0744 04/22/13 1450   04/12/13 2000  vancomycin (VANCOCIN) IVPB 1000 mg/200 mL premix     1,000 mg 200 mL/hr over 60 Minutes Intravenous Every 12 hours 04/12/13 1457 04/12/13 2200   04/12/13  0600  vancomycin (VANCOCIN) 1,500 mg in sodium chloride 0.9 % 500 mL IVPB     1,500 mg 250 mL/hr over 120 Minutes Intravenous On call to O.R. 04/11/13 1403 04/12/13 0747       SUBJECTIVE: Walking limited by PICC placement and draining incision yest, but feels breathing improving.  VITAL SIGNS: Temp:  [97.2 F (36.2 C)-98.2 F (36.8 C)] 97.7 F (36.5 C) (04/20 1119) Pulse Rate:  [73-87] 73 (04/20 0736) Resp:  [19-27] 24 (04/20 0736) BP: (96-138)/(49-94) 138/94 mmHg (04/20 0736) SpO2:  [96 %-100 %] 98 % (04/20 0819) FiO2 (%):  [21 %] 21 % (04/19 2055) Weight:  [108.3 kg (238 lb 12.1 oz)] 108.3 kg (238 lb 12.1 oz) (04/20 0316) HEMODYNAMICS:   VENTILATOR SETTINGS: Vent Mode:  [-]  FiO2 (%):  [21 %] 21 % INTAKE / OUTPUT: Intake/Output     04/19 0701 - 04/20 0700 04/20 0701 - 04/21 0700   P.O. 660 360   I.V. (mL/kg) 23 (0.2)    IV Piggyback     Total Intake(mL/kg) 683 (6.3) 360 (3.3)   Urine (mL/kg/hr) 2000 (0.8) 350 (0.7)   Total Output 2000 350   Net -1317 +10        Stool Occurrence 1 x      PHYSICAL EXAMINATION: General: Looks better. Sitting in chair.  Neuro:  Intact HEENT:  No LAN/JVD Cardiovascular: RRR s M Lungs: diminished but no rhonchi or wheeze Chest: bandage at chest tube site. No drainage Abdomen:  Obese + bs Ext: Symmetric BLE edema 2+ Skin: rash faded. Dressing over incision  LABS: reviewed  CXR: None since 4/16   ASSESSMENT / PLAN: Acute on chronic respiratory failure Post VATS wedge resection of LUL sq cell ca Baseline COPD and restrictive physiology due to obesity- evident sitting in chair Chronic cough - not demonstrated at this visit OSA H flu bronchitis vs PNA Suspected pseudotumor on L +/- infiltrate Possible component of pulmonary edema LE edema Hyponatremia Nonspecific rash - concern for drug reaction (Cipro). Favor contact rash from tape- very localized RA without definitive evidence of pulmonary involvement, Preop  immunosuppressive Rx   - doing well but still with lower  lobe atelectasis on exam  P:   abx per flow sheet and per CVTS Emphasized Incentive spirometry again Agree with plan for Rehab  OPD followup with NP Tammy Parrett: Tue, 05/10/13 at 14.15 @[facility]  [place] wil sign off   Dr. Brand Males, M.D., [facility].C.P Pulmonary and Critical Care Medicine Staff Physician [facility] Pager: [phone], If no answer or between  15:00h - 7:00h: call [phone]  04/25/2013 11:34 AM

## 2013-04-26 MED ORDER — LEVALBUTEROL HCL 0.63 MG/3ML IN NEBU: 0.6300 mg | INHALATION_SOLUTION | Freq: Once | RESPIRATORY_TRACT | Status: DC

## 2013-04-26 NOTE — Progress Notes (Addendum)
      [address]       [place], [address]             [phone]       14 Days Post-Op Procedure(s) (LRB): VIDEO BRONCHOSCOPY (N/A) VIDEO ASSISTED THORACOSCOPY (VATS)/THOROCOTOMY, WITH LEFT UPPER LOBE WEDGE RESECTION, CHEST WALL BIOPSY  (Left) LYMPH NODE DISSECTION (Left)  Subjective: Patient has pain when tape removed from left wound. Nurse was able to leave wound pen to air for about 12 hours. When patient got up to chair, she began draining ser sanguinous so a dressing was re applied.  Objective: Vital signs in last 24 hours: Temp:  [97.4 F (36.3 C)-98.2 F (36.8 C)] 97.8 F (36.6 C) (04/21 0353) Pulse Rate:  [70-84] 84 (04/21 0353) Cardiac Rhythm:  [-] Normal sinus rhythm (04/21 0353) Resp:  [21-28] 28 (04/21 0353) BP: (106-153)/(58-94) 142/58 mmHg (04/21 0353) SpO2:  [95 %-100 %] 95 % (04/21 0353)      Intake/Output from previous day: 04/20 0701 - 04/21 0700 In: 540 [P.O.:540] Out: 3100 [Urine:3100]   Physical Exam:  Cardiovascular: RRR Pulmonary: Expiratory wheezing, no rales or rhonchi. Abdomen: Soft, non tender, bowel sounds present. Extremities:Bilateral lower extremity edema. Wounds: Drainage is sero sanguinous. No surrounding erythema. Wound is not dehisced. Tape is causing irritation to skin.   Lab Results: CBC:  Recent Labs  04/24/13 0500 04/25/13 1040  WBC 18.1* 10.5  HGB 9.2* 9.0*  HCT 28.4* 27.6*  PLT PLATELET CLUMPS NOTED ON SMEAR, COUNT APPEARS ADEQUATE 267   BMET:   Recent Labs  04/25/13 1040  NA 130*  K 4.4  CL 93*  CO2 26  GLUCOSE 119*  BUN 9  CREATININE 0.44*  CALCIUM 8.4    PT/INR: No results found for this basename: LABPROT, INR,  in the last 72 hours ABG:  INR: Will add last result for INR, ABG once components are confirmed Will add last 4 CBG results once components are confirmed  Assessment/Plan:  1. CV - SR 2.  Pulmonary - Encourage incentive spirometer 3. Left chest wound-on Ceftin and  Vanco. Preliminary wound culture shows rtre gram negative rods. WBC decreasing from 18.1 to 10.5. 4. Anemia-H and H decreased to 9.0 and 27.6. Continue iron and continue folic acid 5. Hyponatremia improved-sodium up to 130 yesterday. On fluid restriction.  Donielle M ZimmermanPA-C 04/26/2013,7:22 AM    poss home on PO antibiotics when we are sure wound erythremia and drainage has cleared. Will need to contact pulmonary about changing Ceftin to PO med., await final culture of wound I have seen and examined Taylor Berry and agree with the above assessment  and plan.  [name] MD Beeper [phone] Office [phone] 04/26/2013 6:06 PM

## 2013-04-26 NOTE — Clinical Social Work Note (Signed)
Patient meeting with Hoyle Sauer from [facility] to complete paperwork for admission to [facility]. CSW will DC patient to [name] once DC order has been received.    Liz Beach MSW, [place], [place], [phone]

## 2013-04-27 ENCOUNTER — Inpatient Hospital Stay (HOSPITAL_COMMUNITY): Payer: Medicare Other

## 2013-04-27 LAB — BASIC METABOLIC PANEL
BUN: 11 mg/dL (ref 6–23)
CO2: 24 mEq/L (ref 19–32)
Calcium: 9 mg/dL (ref 8.4–10.5)
Chloride: 98 mEq/L (ref 96–112)
Creatinine, Ser: 0.49 mg/dL — ABNORMAL LOW (ref 0.50–1.10)
GFR calc Af Amer: >90 mL/min (ref >90)
GFR calc non Af Amer: >90 mL/min (ref >90)
Glucose, Bld: 117 mg/dL — ABNORMAL HIGH (ref 70–99)
Potassium: 4.5 mEq/L (ref 3.7–5.3)
Sodium: 135 mEq/L — ABNORMAL LOW (ref 137–147)

## 2013-04-27 LAB — CBC
HCT: 29.3 % — ABNORMAL LOW (ref 36.0–46.0)
Hemoglobin: 9.4 g/dL — ABNORMAL LOW (ref 12.0–15.0)
MCH: 29.4 pg (ref 26.0–34.0)
MCHC: 32.1 g/dL (ref 30.0–36.0)
MCV: 91.6 fL (ref 78.0–100.0)
Platelets: 274 10*3/uL (ref 150–400)
RBC: 3.2 MIL/uL — ABNORMAL LOW (ref 3.87–5.11)
RDW: 15.3 % (ref 11.5–15.5)
WBC: 10.5 10*3/uL (ref 4.0–10.5)

## 2013-04-27 LAB — WOUND CULTURE: Gram Stain: NONE SEEN

## 2013-04-27 MED ORDER — DEXTROSE 5 % IV SOLN
2.0000 g | Freq: Two times a day (BID) | INTRAVENOUS | Status: DC
  Administered 2013-04-27 – 2013-04-28 (×3): 2 g via INTRAVENOUS
  Filled 2013-04-27 (×4): qty 2

## 2013-04-27 MED ORDER — LEVALBUTEROL HCL 0.63 MG/3ML IN NEBU
0.6300 mg | INHALATION_SOLUTION | Freq: Two times a day (BID) | RESPIRATORY_TRACT | Status: DC
  Administered 2013-04-27 – 2013-04-28 (×2): 0.63 mg via RESPIRATORY_TRACT
  Filled 2013-04-27 (×3): qty 3

## 2013-04-27 MED ORDER — LEVALBUTEROL HCL 0.63 MG/3ML IN NEBU: 0.6300 mg | INHALATION_SOLUTION | RESPIRATORY_TRACT | Status: DC | PRN

## 2013-04-27 MED ORDER — BUDESONIDE 0.25 MG/2ML IN SUSP
0.2500 mg | Freq: Two times a day (BID) | RESPIRATORY_TRACT | Status: DC
  Administered 2013-04-27 – 2013-04-28 (×2): 0.25 mg via RESPIRATORY_TRACT
  Filled 2013-04-27 (×4): qty 2

## 2013-04-27 NOTE — Progress Notes (Addendum)
      [address]       [place] [address]             [phone]       15 Days Post-Op Procedure(s) (LRB): VIDEO BRONCHOSCOPY (N/A) VIDEO ASSISTED THORACOSCOPY (VATS)/THOROCOTOMY, WITH LEFT UPPER LOBE WEDGE RESECTION, CHEST WALL BIOPSY  (Left) LYMPH NODE DISSECTION (Left)  Subjective: Patient has not had drainage from left chest wound.  Objective: Vital signs in last 24 hours: Temp:  [97.4 F (36.3 C)-98.4 F (36.9 C)] 98 F (36.7 C) (04/22 0700) Pulse Rate:  [72-90] 72 (04/22 0404) Cardiac Rhythm:  [-] Normal sinus rhythm (04/21 2000) Resp:  [18-25] 23 (04/22 0404) BP: (115-152)/(44-85) 152/85 mmHg (04/22 0404) SpO2:  [93 %-99 %] 98 % (04/22 0404)     Intake/Output from previous day: 04/21 0701 - 04/22 0700 In: 72 [P.O.:960] Out: 2300 [Urine:2300]   Physical Exam:  Cardiovascular: RRR Pulmonary: Diminished at left base and coarse;right lung is clear Abdomen: Soft, non tender, bowel sounds present. Wounds: No dressing and no drainage . Minor surrounding erythema. Wound is not dehisced. Tape has caused irritation to skin.   Lab Results: CBC:  Recent Labs  04/25/13 1040 04/27/13 0520  WBC 10.5 10.5  HGB 9.0* 9.4*  HCT 27.6* 29.3*  PLT 267 274   BMET:   Recent Labs  04/25/13 1040 04/27/13 0520  NA 130* 135*  K 4.4 4.5  CL 93* 98  CO2 26 24  GLUCOSE 119* 117*  BUN 9 11  CREATININE 0.44* 0.49*  CALCIUM 8.4 9.0    PT/INR: No results found for this basename: LABPROT, INR,  in the last 72 hours ABG:  INR: Will add last result for INR, ABG once components are confirmed Will add last 4 CBG results once components are confirmed  Assessment/Plan:  1. CV - SR 2.  Pulmonary - CXR appears to show ? Trace left apical pneumothorax and left pleural effusion.Encourage incentive spirometer 3. Left chest wound-on Ceftin and Vanco. Wound culture now shows moderate Pseudomonas Aeruginosa. WBC remains 10.5. Per pharmacy, will change  antibiotic to Maxipime 2 grams IV q 12. Stop Ceftin and Vanco 4. Anemia-H and H increased to 9.4 and 29.3. Continue iron and continue folic acid 5. Hyponatremia improved-sodium up to 135. On fluid restriction. 6. Possibly to SNF soon  Donielle M ZimmermanPA-C 04/27/2013,7:30 AM Hyponatremia resolved Wound much better, no drainage ery thymia resolving cultures noted Will review all cultures for sensitivity to put on oral agent to complete course Poss nhp  Tomorrow. I have seen and examined Taylor Berry and agree with the above assessment  and plan.  [name] MD Beeper [phone] Office [phone] 04/27/2013 12:18 PM

## 2013-04-28 DIAGNOSIS — J90 Pleural effusion, not elsewhere classified: Secondary | ICD-10-CM | POA: Diagnosis not present

## 2013-04-28 DIAGNOSIS — Z48813 Encounter for surgical aftercare following surgery on the respiratory system: Secondary | ICD-10-CM | POA: Diagnosis not present

## 2013-04-28 DIAGNOSIS — G4733 Obstructive sleep apnea (adult) (pediatric): Secondary | ICD-10-CM | POA: Diagnosis not present

## 2013-04-28 DIAGNOSIS — I10 Essential (primary) hypertension: Secondary | ICD-10-CM | POA: Diagnosis not present

## 2013-04-28 DIAGNOSIS — M6281 Muscle weakness (generalized): Secondary | ICD-10-CM | POA: Diagnosis not present

## 2013-04-28 DIAGNOSIS — R5383 Other fatigue: Secondary | ICD-10-CM | POA: Diagnosis not present

## 2013-04-28 DIAGNOSIS — R0602 Shortness of breath: Secondary | ICD-10-CM | POA: Diagnosis not present

## 2013-04-28 DIAGNOSIS — J449 Chronic obstructive pulmonary disease, unspecified: Secondary | ICD-10-CM | POA: Diagnosis not present

## 2013-04-28 DIAGNOSIS — D630 Anemia in neoplastic disease: Secondary | ICD-10-CM | POA: Diagnosis not present

## 2013-04-28 DIAGNOSIS — M069 Rheumatoid arthritis, unspecified: Secondary | ICD-10-CM | POA: Diagnosis not present

## 2013-04-28 DIAGNOSIS — E669 Obesity, unspecified: Secondary | ICD-10-CM | POA: Diagnosis not present

## 2013-04-28 DIAGNOSIS — R279 Unspecified lack of coordination: Secondary | ICD-10-CM | POA: Diagnosis not present

## 2013-04-28 DIAGNOSIS — R05 Cough: Secondary | ICD-10-CM | POA: Diagnosis not present

## 2013-04-28 DIAGNOSIS — I279 Pulmonary heart disease, unspecified: Secondary | ICD-10-CM | POA: Diagnosis not present

## 2013-04-28 DIAGNOSIS — D649 Anemia, unspecified: Secondary | ICD-10-CM | POA: Diagnosis not present

## 2013-04-28 DIAGNOSIS — C3410 Malignant neoplasm of upper lobe, unspecified bronchus or lung: Secondary | ICD-10-CM | POA: Diagnosis not present

## 2013-04-28 DIAGNOSIS — M810 Age-related osteoporosis without current pathological fracture: Secondary | ICD-10-CM | POA: Diagnosis not present

## 2013-04-28 DIAGNOSIS — L089 Local infection of the skin and subcutaneous tissue, unspecified: Secondary | ICD-10-CM | POA: Diagnosis not present

## 2013-04-28 DIAGNOSIS — R059 Cough, unspecified: Secondary | ICD-10-CM | POA: Diagnosis not present

## 2013-04-28 DIAGNOSIS — T148XXA Other injury of unspecified body region, initial encounter: Secondary | ICD-10-CM | POA: Diagnosis not present

## 2013-04-28 DIAGNOSIS — D62 Acute posthemorrhagic anemia: Secondary | ICD-10-CM | POA: Diagnosis not present

## 2013-04-28 DIAGNOSIS — E785 Hyperlipidemia, unspecified: Secondary | ICD-10-CM | POA: Diagnosis not present

## 2013-04-28 DIAGNOSIS — R269 Unspecified abnormalities of gait and mobility: Secondary | ICD-10-CM | POA: Diagnosis not present

## 2013-04-28 DIAGNOSIS — R5381 Other malaise: Secondary | ICD-10-CM | POA: Diagnosis not present

## 2013-04-28 DIAGNOSIS — J4489 Other specified chronic obstructive pulmonary disease: Secondary | ICD-10-CM | POA: Diagnosis not present

## 2013-04-28 MED ORDER — BUDESONIDE 0.25 MG/2ML IN SUSP: 0.2500 mg | Freq: Two times a day (BID) | RESPIRATORY_TRACT | Status: DC

## 2013-04-28 MED ORDER — FERROUS SULFATE 325 (65 FE) MG PO TABS: 325.0000 mg | ORAL_TABLET | Freq: Every day | ORAL | Status: DC

## 2013-04-28 MED ORDER — TRAMADOL HCL 50 MG PO TABS: 50.0000 mg | ORAL_TABLET | Freq: Four times a day (QID) | ORAL | Status: DC | PRN

## 2013-04-28 MED ORDER — DEXTROSE 5 % IV SOLN: 2.0000 g | Freq: Two times a day (BID) | INTRAVENOUS | Status: DC

## 2013-04-28 MED ORDER — FLUCONAZOLE 150 MG PO TABS
150.0000 mg | ORAL_TABLET | Freq: Every day | ORAL | Status: DC
  Administered 2013-04-28: 150 mg via ORAL
  Filled 2013-04-28: qty 1

## 2013-04-28 NOTE — Progress Notes (Signed)
Pt provided with set of discharge instructions for follow up appts. Pack for [employer] and provided to son to give to [facility] VSS. Belongs packed. PICC flushed C/D/I. No signs of infection at surgical site.

## 2013-04-28 NOTE — Clinical Social Work Note (Signed)
Per MD patient ready for DC. RN, patient, and facility aware of DC. RN given number for report. DC packet on chart. Patient's son to transport to facility. CSW signing off at this time.  Liz Beach MSW, [name], [place], [phone]

## 2013-04-28 NOTE — Progress Notes (Signed)
Utilization review completed.  

## 2013-04-28 NOTE — Discharge Instructions (Signed)
°  Lung Resection Care After Refer to this sheet in the next few weeks. These instructions provide you with information on caring for yourself after your procedure. Your caregiver may also give you more specific instructions. Your treatment has been planned according to current medical practices, but problems sometimes occur. Call your caregiver if you have any problems or questions after your procedure. HOME CARE INSTRUCTIONS  You may resume a normal diet and activities as directed.  Do not smoke or use tobacco products.  Change your bandages (dressings) as directed.  Only take over-the-counter or prescription medicines for pain, discomfort, or fever as directed by your caregiver.  Keep all follow-up appointments as directed.  Try to breathe deeply and cough as directed. Holding a pillow firmly over your ribs may help with discomfort.  If you were given an incentive spirometer in the hospital, continue to use it as directed.  Walk as directed by your caregiver.  You may take a shower and gently wash the area of your surgical cut (incision) with water and soap as directed. Do not use anything else to clean your incision except as directed by your caregiver. Do not take baths or sit in a hot tub. SEEK MEDICAL CARE IF:  You notice redness, swelling, or increasing pain in the incision.  You are bleeding from the incision.  You see pus coming from the incision.  You notice a bad smell coming from the incision or dressing.  Your incision breaks open.  You cough up blood or pus, or you develop a cough that produces bad smelling sputum.  You have pain or swelling in your legs.  You have increasing pain that is not controlled with medicine.  You have trouble managing any of the tubes that have been left in place after surgery. SEEK IMMEDIATE MEDICAL CARE IF:   You have a fever or chills.  You have any reaction or side effects to medicines given.  You have chest pain or an  irregular or rapid heartbeat.  You have dizzy episodes or fainting.  You have shortness of breath or difficulty breathing.  You have persistent nausea or vomiting.  You have a rash. MAKE SURE YOU:  Understand these instructions.  Will watch your condition.  Will get help right away if you are not doing well or get worse. Document Released: 07/12/2004 Document Revised: 03/17/2011 Document Reviewed: 08/22/2010 [facility] Patient Information 2014 [place], [place].   ACTIVITY:  1.Increase activity slowly. 2.Walk daily and increase frequency and duration as tolerates. 3.May walk up steps. 4.No lifting more than ten pounds for two weeks. 5.No driving for two weeks. 6.Avoid straining. 7.STOP any activity that causes chest pain, shortness of breath, dizziness,sweating,     or excessive weakness. 8.Continue with breathing exercises daily.  DIET:  Low fat diet   WOUND:  1.May shower. 2.Clean wounds with mild soap and water.  Call the office at [phone] if any problems arise. 3. If patient develops increasing redness, drainage, or fever or chills, call office as soon as possible.

## 2013-04-28 NOTE — Progress Notes (Addendum)
      [address]       [place] [address]             [phone]       16 Days Post-Op Procedure(s) (LRB): VIDEO BRONCHOSCOPY (N/A) VIDEO ASSISTED THORACOSCOPY (VATS)/THOROCOTOMY, WITH LEFT UPPER LOBE WEDGE RESECTION, CHEST WALL BIOPSY  (Left) LYMPH NODE DISSECTION (Left)  Subjective: Patient has not had drainage from left chest wound. She has a yeast infection (as is on antibiotics).  Objective: Vital signs in last 24 hours: Temp:  [97.6 F (36.4 C)-98.2 F (36.8 C)] 97.8 F (36.6 C) (04/23 0400) Pulse Rate:  [65-87] 87 (04/23 0354) Cardiac Rhythm:  [-] Normal sinus rhythm (04/22 2000) Resp:  [22-31] 22 (04/23 0354) BP: (138-156)/(64-89) 140/71 mmHg (04/23 0354) SpO2:  [91 %-98 %] 91 % (04/23 0354)     Intake/Output from previous day: 04/22 0701 - 04/23 0700 In: 480 [P.O.:480] Out: 3625 [Urine:3625]   Physical Exam:  Cardiovascular: RRR Pulmonary:Coarse on left  and coarse;right lung is clear Abdomen: Soft, non tender, bowel sounds present. Wounds: No dressing and no drainage . Minor surrounding erythema. Wound is not dehisced. Tape has caused irritation to skin.   Lab Results: CBC:  Recent Labs  04/25/13 1040 04/27/13 0520  WBC 10.5 10.5  HGB 9.0* 9.4*  HCT 27.6* 29.3*  PLT 267 274   BMET:   Recent Labs  04/25/13 1040 04/27/13 0520  NA 130* 135*  K 4.4 4.5  CL 93* 98  CO2 26 24  GLUCOSE 119* 117*  BUN 9 11  CREATININE 0.44* 0.49*  CALCIUM 8.4 9.0    PT/INR: No results found for this basename: LABPROT, INR,  in the last 72 hours ABG:  INR: Will add last result for INR, ABG once components are confirmed Will add last 4 CBG results once components are confirmed  Assessment/Plan:  1. CV - SR. On Avapro 150 daily. 2.  Pulmonary -Encourage incentive spirometer 3. Left chest wound-on Maxipime as wound culture now shows moderate Pseudomonas Aeruginosa. I spoke with pharmacist. Above organism is resistant to oral Cipro.  Recommends finishing Maxipime as no good oral agent to cover above as well as H. Flu, E. Coli, etc. 4. Anemia-H and H increased to 9.4 and 29.3. Continue iron and continue folic acid 5. Hyponatremia improved-sodium up to 135. On fluid restriction. 6. Diflucan for yeast infection 7. Hopefully, able to find a facility that will administer IV Maxipime.  Donielle M ZimmermanPA-C 04/28/2013,7:34 AM    Wound healed without evidence of infection currently patient now will have help at home , so will complete 14 days of Maxi pine at home with home nursing, PT and OT. If arrenagements can be made could go today or tomorrow. I have seen and examined Taylor Berry and agree with the above assessment  and plan.  Delight Ovens MD Beeper [phone] Office [phone] 04/28/2013 9:16 AM

## 2013-04-28 NOTE — Progress Notes (Signed)
Physical Therapy Treatment Patient Details Name: Taylor Berry MRN: [id number] DOB: Aug 29, 1940 Today's Date: 04/28/2013    History of Present Illness pt presents with Vats and LUL Resection.      PT Comments    Pt progressing mobility to supervision level and increasing ambulation distance this session. Pt educated and performed HEP to encourage continued theraex during hospital stay and upon acute D/C. Pt hopeful to D/C home; would be safe from mobility standpoint to D/C home with RW and 24/7 (A), pending antibiotics needed upon D/C. Will cont to follow per POC.  Follow Up Recommendations  Home health PT;Other (comment);Supervision/Assistance - 24 hour;SNF (pending IV antibiotics )     Equipment Recommendations  Rolling walker with 5" wheels    Recommendations for Other Services       Precautions / Restrictions Precautions Precautions: Fall Precaution Comments: on RA today for ambulation  Restrictions Weight Bearing Restrictions: No    Mobility  Bed Mobility               General bed mobility comments: not assessed; pt sitting in chair and returned to chair   Transfers Overall transfer level: Modified independent Equipment used: Rolling walker (2 wheeled) Transfers: Sit to/from Stand Sit to Stand: Modified independent (Device/Increase time)         General transfer comment: incr time required; use of armrests to push up from chair  Ambulation/Gait Ambulation/Gait assistance: Supervision Ambulation Distance (Feet): 300 Feet Assistive device: Rolling walker (2 wheeled) Gait Pattern/deviations: Step-through pattern;Wide base of support;Trunk flexed Gait velocity: decreased Gait velocity interpretation: Below normal speed for age/gender General Gait Details: required 2 standing rest breaks; cues for pursed lip breathing;     Stairs            Wheelchair Mobility    Modified Rankin (Stroke Patients Only)       Balance Overall balance  assessment:  (RW required for safe ambulation )                                  Cognition Arousal/Alertness: Awake/alert Behavior During Therapy: Anxious Overall Cognitive Status: Within Functional Limits for tasks assessed                      Exercises General Exercises - Lower Extremity Ankle Circles/Pumps: AROM;Both;10 reps Long Arc Quad: AROM;Both;Strengthening;10 reps;Other (comment) (holding 3 second count ) Hip Flexion/Marching: AROM;Strengthening;Both;10 reps;Seated;Standing Other Exercises Other Exercises: reviewed HEP with pt with min cues for proper technique; encouraged pt to perform sit to stand x5 for LE strengthening     General Comments        Pertinent Vitals/Pain C/o pain in surgical area; did not rate pain but described it as "constant"     Home Living                      Prior Function            PT Goals (current goals can now be found in the care plan section) Acute Rehab PT Goals Patient Stated Goal: go home  PT Goal Formulation: With patient Time For Goal Achievement: 05/06/13 Potential to Achieve Goals: Good Progress towards PT goals: Progressing toward goals    Frequency  Min 2X/week    PT Plan Discharge plan needs to be updated    Co-evaluation  End of Session Equipment Utilized During Treatment: Gait belt Activity Tolerance: Patient tolerated treatment well Patient left: in chair;with call bell/phone within reach     Time: 0832-0856 PT Time Calculation (min): 24 min  Charges:  $Gait Training: 8-22 mins $Therapeutic Exercise: 8-22 mins                    G CodesKennis Carina [place], [place] [phone] 04/28/2013, 9:55 AM

## 2013-04-29 ENCOUNTER — Non-Acute Institutional Stay (SKILLED_NURSING_FACILITY): Payer: Medicare Other | Admitting: Adult Health

## 2013-04-29 ENCOUNTER — Inpatient Hospital Stay: Payer: Medicare Other | Admitting: Emergency Medicine

## 2013-04-29 ENCOUNTER — Encounter: Payer: Self-pay | Admitting: Adult Health

## 2013-04-29 DIAGNOSIS — J4489 Other specified chronic obstructive pulmonary disease: Secondary | ICD-10-CM | POA: Diagnosis not present

## 2013-04-29 DIAGNOSIS — F3289 Other specified depressive episodes: Secondary | ICD-10-CM

## 2013-04-29 DIAGNOSIS — L089 Local infection of the skin and subcutaneous tissue, unspecified: Secondary | ICD-10-CM

## 2013-04-29 DIAGNOSIS — F32A Depression, unspecified: Secondary | ICD-10-CM | POA: Insufficient documentation

## 2013-04-29 DIAGNOSIS — I10 Essential (primary) hypertension: Secondary | ICD-10-CM

## 2013-04-29 DIAGNOSIS — T148XXA Other injury of unspecified body region, initial encounter: Secondary | ICD-10-CM

## 2013-04-29 DIAGNOSIS — J449 Chronic obstructive pulmonary disease, unspecified: Secondary | ICD-10-CM | POA: Diagnosis not present

## 2013-04-29 DIAGNOSIS — F329 Major depressive disorder, single episode, unspecified: Secondary | ICD-10-CM

## 2013-04-29 DIAGNOSIS — J309 Allergic rhinitis, unspecified: Secondary | ICD-10-CM

## 2013-04-29 DIAGNOSIS — E7800 Pure hypercholesterolemia, unspecified: Secondary | ICD-10-CM

## 2013-04-29 DIAGNOSIS — D62 Acute posthemorrhagic anemia: Secondary | ICD-10-CM

## 2013-04-29 DIAGNOSIS — C3410 Malignant neoplasm of upper lobe, unspecified bronchus or lung: Secondary | ICD-10-CM

## 2013-04-29 NOTE — Progress Notes (Signed)
Patient ID: Taylor Berry, female   DOB: 17-Nov-1940, 73 y.o.   MRN: [id number]               PROGRESS NOTE  DATE: 04/29/2013  FACILITY: Nursing Home Location: [facility]  LEVEL OF CARE: SNF (31)  Acute Visit  CHIEF COMPLAINT: Follow-up Hospitalization  HISTORY OF PRESENT ILLNESS: This is a 73 year old female who has been admitted to [facility] on 04/28/13 from [facility] with new diagnosis of  Lung Cancer, Left Upper Lobe. She has been admitted for a short-term rehabilitation.  REASSESSMENT OF ONGOING PROBLEM(S):  HTN: Pt 's HTN remains stable.  Denies CP, sob, DOE, pedal edema, headaches, dizziness or visual disturbances.  No complications from the medications currently being used.  Last BP : 134/71  ALLERGIC RHINITIS: Allergic rhinitis remains stable.  Patient denies ongoing symptoms such as runny nose sneezing or tearing. No complications reported from the current medication(s) being used.  COPD: the COPD remains stable.  Pt denies sob, cough, wheezing or declining exercise tolerance.  No complications from the medications presently being used.  PAST MEDICAL HISTORY : Reviewed.  No changes/see problem list  CURRENT MEDICATIONS: Reviewed per MAR/see medication list  REVIEW OF SYSTEMS:  GENERAL: no change in appetite, no fatigue, no weight changes, no fever, chills or weakness RESPIRATORY: no cough, SOB, DOE, wheezing, hemoptysis CARDIAC: no chest pain,or palpitations, +edema GI: no abdominal pain, diarrhea, constipation, heart burn, nausea or vomiting  PHYSICAL EXAMINATION  GENERAL: no acute distress, normal body habitus EYES: conjunctivae normal, sclerae normal, normal eye lids NECK: supple, trachea midline, no neck masses, no thyroid tenderness, no thyromegaly LYMPHATICS: no LAN in the neck, no supraclavicular LAN RESPIRATORY: breathing is even & unlabored, BS CTAB CARDIAC: RRR, no murmur,no extra heart sounds, BLE edema 2+ GI: abdomen  soft, normal BS, no masses, no tenderness, no hepatomegaly, no splenomegaly EXTREMITIES:  Able to move all 4 extremities; Right upper arm single lumen PIC intact and patent PSYCHIATRIC: the patient is alert & oriented to person, affect & behavior appropriate  LABS/RADIOLOGY: Labs reviewed: Basic Metabolic Panel:  Recent Labs  04/23/13 0415 04/25/13 1040 04/27/13 0520  NA 129* 130* 135*  K 4.8 4.4 4.5  CL 90* 93* 98  CO2 25 26 24   GLUCOSE 124* 119* 117*  BUN 13 9 11   CREATININE 0.50 0.44* 0.49*  CALCIUM 8.9 8.4 9.0   Liver Function Tests:  Recent Labs  04/11/13 1458 04/14/13 0420  AST 24 19  ALT 9 7  ALKPHOS 57 51  BILITOT 0.3 0.8  PROT 7.8 6.5  ALBUMIN 3.3* 2.7*   CBC:  Recent Labs  04/24/13 0500 04/25/13 1040 04/27/13 0520  WBC 18.1* 10.5 10.5  NEUTROABS 13.1*  --   --   HGB 9.2* 9.0* 9.4*  HCT 28.4* 27.6* 29.3*  MCV 90.2 90.8 91.6  PLT PLATELET CLUMPS NOTED ON SMEAR, COUNT APPEARS ADEQUATE 267 274   CBG:  Recent Labs  02/17/13 1012  GLUCAP 111*    ASSESSMENT/PLAN:  Lung Cancer, left upper lobe status post wage recession of left upper lobe - for rehabilitation COPD - stable; continue Pulmicort Anemia, acute blood loss - continue FeSO4 Allergic Rhinitis - continue Fluticasone Hypertension - well-controlled; continue Benicar Hyperlipidemia - continue Zocor Depression - continue Effexor XR Left chest wound infection/+Pseudomonas aeruginosa - continue Cefepime IV   CPT CODE: 29937  Monina Vargas - NP [facility] [phone]

## 2013-05-02 ENCOUNTER — Encounter: Payer: Self-pay | Admitting: *Deleted

## 2013-05-02 ENCOUNTER — Non-Acute Institutional Stay (SKILLED_NURSING_FACILITY): Payer: Medicare Other | Admitting: Internal Medicine

## 2013-05-02 DIAGNOSIS — D630 Anemia in neoplastic disease: Secondary | ICD-10-CM | POA: Insufficient documentation

## 2013-05-02 DIAGNOSIS — J4489 Other specified chronic obstructive pulmonary disease: Secondary | ICD-10-CM | POA: Diagnosis not present

## 2013-05-02 DIAGNOSIS — J449 Chronic obstructive pulmonary disease, unspecified: Secondary | ICD-10-CM | POA: Diagnosis not present

## 2013-05-02 DIAGNOSIS — I10 Essential (primary) hypertension: Secondary | ICD-10-CM

## 2013-05-02 DIAGNOSIS — C3410 Malignant neoplasm of upper lobe, unspecified bronchus or lung: Secondary | ICD-10-CM | POA: Diagnosis not present

## 2013-05-02 NOTE — Progress Notes (Signed)
HISTORY & PHYSICAL  DATE: 05/02/2013   FACILITY: [facility]  LEVEL OF CARE: SNF (31)  ALLERGIES:  Allergies  Allergen Reactions  . Latex     REACTION: itching  . Ace Inhibitors     REACTION: cough  . Hydrochlorothiazide     REACTION: decreased sodium  . Penicillins     REACTION: intolerant due to yeast infection    CHIEF COMPLAINT:  Manage lung cancer, COPD and hypertension  HISTORY OF PRESENT ILLNESS: Patient is a 73 year old Caucasian female who was hospitalized secondary to acute respiratory failure and then admitted to this facility for short-term rehabilitation.  LUNG CANCER: The patient was on diagnosed with invasive squamous cell carcinoma and she underwent Mini thoracoscotomy with wedge resection of the left upper lobe, chest wall biopsy and lymph node dissection. She tolerated the procedure well.  COPD: the COPD remains stable.  Pt denies sob, cough, wheezing or declining exercise tolerance.  No complications from the medications presently being used.  HTN: Pt 's HTN remains stable.  Denies CP, sob, DOE, pedal edema, headaches, dizziness or visual disturbances.  No complications from the medications currently being used.  Last BP : 136/60.  PAST MEDICAL HISTORY :  Past Medical History  Diagnosis Date  . Hypertension   . Rheumatoid arthritis(714.0)   . Osteoporosis   . Allergic rhinitis   . Hyperlipidemia   . Obesity   . OSA (obstructive sleep apnea)   . Osteoarthritis   . Uterine cancer   . COPD (chronic obstructive pulmonary disease)     cervical cancer  . Shortness of breath   . Cough with expectoration     coughs up phlegm early morning  . GERD (gastroesophageal reflux disease)   . Left bundle branch block     PAST SURGICAL HISTORY: Past Surgical History  Procedure Laterality Date  . Vesicovaginal fistula closure w/ tah  1971  . Tonsillectomy    . Abdominal hysterectomy    . Colonoscopy w/ biopsies and polypectomy     . Cardiac catheterization  01/27/11    minor non-obs CAD, NL EF, mild pulm HTN  . Video bronchoscopy N/A 04/12/2013    Procedure: VIDEO BRONCHOSCOPY;  Surgeon: [name], MD;  Location: [facility] OR;  Service: Thoracic;  Laterality: N/A;  . Video assisted thoracoscopy (vats)/thorocotomy Left 04/12/2013    Procedure: VIDEO ASSISTED THORACOSCOPY (VATS)/THOROCOTOMY, WITH LEFT UPPER LOBE WEDGE RESECTION, CHEST WALL BIOPSY ;  Surgeon: [name], MD;  Location: [place];  Service: Thoracic;  Laterality: Left;  . Lymph node dissection Left 04/12/2013    Procedure: LYMPH NODE DISSECTION;  Surgeon: [name], MD;  Location: [place];  Service: Thoracic;  Laterality: Left;    SOCIAL HISTORY:  reports that she quit smoking about 25 years ago. Her smoking use included Cigarettes. She has a 35 pack-year smoking history. She has never used smokeless tobacco. She reports that she does not drink alcohol or use illicit drugs.  FAMILY HISTORY:  Family History  Problem Relation Age of Onset  . Lung cancer Mother   . Coronary artery disease Son     MI at age 11    CURRENT MEDICATIONS: Reviewed per MAR/see medication list  REVIEW OF SYSTEMS:  See HPI otherwise 14 point ROS is negative.  PHYSICAL EXAMINATION  VS:  See VS section  GENERAL: no acute distress, moderately obese body habitus EYES: conjunctivae normal, sclerae normal, normal eye lids MOUTH/THROAT: lips  without lesions,no lesions in the mouth,tongue is without lesions,uvula elevates in midline NECK: supple, trachea midline, no neck masses, no thyroid tenderness, no thyromegaly LYMPHATICS: no LAN in the neck, no supraclavicular LAN RESPIRATORY: breathing is even & unlabored, BS CTAB CARDIAC: RRR, no murmur,no extra heart sounds, +1 bilateral lower extremity edema GI:  ABDOMEN: abdomen soft, normal BS, no masses, no tenderness  LIVER/SPLEEN: no hepatomegaly, no splenomegaly MUSCULOSKELETAL: HEAD: normal to inspection &  palpation BACK: no kyphosis, scoliosis or spinal processes tenderness EXTREMITIES: LEFT UPPER EXTREMITY: Moderate range of motion, normal strength & tone RIGHT UPPER EXTREMITY:  full range of motion, normal strength & tone LEFT LOWER EXTREMITY:  Minimal range of motion, decreased strength & tone RIGHT LOWER EXTREMITY:  Minimal range of motion, decreased strength & tone PSYCHIATRIC: the patient is alert & oriented to person, affect & behavior appropriate  LABS/RADIOLOGY:  Labs reviewed: Basic Metabolic Panel:  Recent Labs  04/23/13 0415 04/25/13 1040 04/27/13 0520  NA 129* 130* 135*  K 4.8 4.4 4.5  CL 90* 93* 98  CO2 '25 26 24  '$ GLUCOSE 124* 119* 117*  BUN '13 9 11  '$ CREATININE 0.50 0.44* 0.49*  CALCIUM 8.9 8.4 9.0   Liver Function Tests:  Recent Labs  04/11/13 1458 04/14/13 0420  AST 24 19  ALT 9 7  ALKPHOS 57 51  BILITOT 0.3 0.8  PROT 7.8 6.5  ALBUMIN 3.3* 2.7*   CBC:  Recent Labs  04/24/13 0500 04/25/13 1040 04/27/13 0520  WBC 18.1* 10.5 10.5  NEUTROABS 13.1*  --   --   HGB 9.2* 9.0* 9.4*  HCT 28.4* 27.6* 29.3*  MCV 90.2 90.8 91.6  PLT PLATELET CLUMPS NOTED ON SMEAR, COUNT APPEARS ADEQUATE 267 274   CBG:  Recent Labs  02/17/13 1012  GLUCAP 111*     CHEST  2 VIEW   COMPARISON:  DG CHEST 1 VIEW dated 04/24/2013   FINDINGS: Postsurgical changes are appreciated within the left hemithorax. A stable left hydro pneumothorax. No new focal regions of consolidation or new focal infiltrates. A right sided central venous catheter via a right subclavian approach tip at the level superior vena cava. Degenerative changes are appreciated within the shoulders.   IMPRESSION: Stable postsurgical changes in the left hemi thorax. Specifically stable left hydro pneumothorax. No acute abnormalities.  ASSESSMENT/PLAN:  Squamous cell carcinoma of the lung-status post resection. COPD-well compensated Hypertension-well-controlled Anemia of  neoplasm-recheck Allergic rhinitis-well controlled GERD-stable Check CBC  I have reviewed patient's medical records received at admission/from hospitalization.  CPT CODE: 32440  Jasen Hartstein Y Tesean Stump, [place] [phone]

## 2013-05-03 ENCOUNTER — Other Ambulatory Visit: Payer: Self-pay | Admitting: Cardiothoracic Surgery

## 2013-05-03 DIAGNOSIS — C3410 Malignant neoplasm of upper lobe, unspecified bronchus or lung: Secondary | ICD-10-CM

## 2013-05-05 ENCOUNTER — Ambulatory Visit (INDEPENDENT_AMBULATORY_CARE_PROVIDER_SITE_OTHER): Payer: Self-pay | Admitting: Cardiothoracic Surgery

## 2013-05-05 ENCOUNTER — Encounter: Payer: Self-pay | Admitting: Cardiothoracic Surgery

## 2013-05-05 ENCOUNTER — Ambulatory Visit
Admission: RE | Admit: 2013-05-05 | Discharge: 2013-05-05 | Disposition: A | Discharge status: Home / Self Care | Payer: Medicare Other | Source: Ambulatory Visit | Attending: Cardiothoracic Surgery | Admitting: Cardiothoracic Surgery

## 2013-05-05 VITALS — BP 168/79 | HR 90 | Resp 18 | Ht 62.0 in | Wt 228.0 lb

## 2013-05-05 DIAGNOSIS — Z09 Encounter for follow-up examination after completed treatment for conditions other than malignant neoplasm: Secondary | ICD-10-CM

## 2013-05-05 DIAGNOSIS — C3410 Malignant neoplasm of upper lobe, unspecified bronchus or lung: Secondary | ICD-10-CM

## 2013-05-05 DIAGNOSIS — Z8541 Personal history of malignant neoplasm of cervix uteri: Secondary | ICD-10-CM

## 2013-05-05 DIAGNOSIS — J90 Pleural effusion, not elsewhere classified: Secondary | ICD-10-CM | POA: Diagnosis not present

## 2013-05-05 NOTE — Progress Notes (Signed)
[address]       [place] [address]             [phone]      Taylor Berry Medical Record #[id number] Date of Birth: 08-10-1940  Referring: Collene Gobble, MD Primary Care: Osborne Casco, MD  Chief Complaint:   POST OP FOLLOW UP 04/12/2013  PREOPERATIVE DIAGNOSIS: Squamous cell carcinoma of the left upper lobe  with limited pulmonary reserve.  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  SURGICAL PROCEDURE: Video bronchoscopy, left video-assisted  thoracoscopy, mini-thoracotomy, wedge resection of left upper lobe with  lymph node dissection, and biopsy of chest wall.  SURGEON: Lanelle Bal, MD  Lung cancer, left upper lobe   Primary site: Lung (Left)   Staging method: AJCC 7th Edition   Pathologic: Stage IB (T2a, N0, cM0) signed by [name], MD on 04/14/2013  8:31 AM   Summary: Stage IB (T2a, N0, cM0)  History of Present Illness:     Patient returns to the office today with followup chest x-ray following recent wedge resection of left upper lobe for squamous cell carcinoma stage IB.  During her hospital course she developed Klebsiella in her sputum, Escherichia coli in her urine, Pseudomonas at the left chest incision site. She is completing a course of IV antibiotics SNF place. Overall since discharge the patient looks much improved. She's comfortable at rest in the office not on oxygen. She continues to have some lower extremity edema but this is improving. She has had no wound drainage      Past Medical History  Diagnosis Date  . Hypertension   . Rheumatoid arthritis(714.0)   . Osteoporosis   . Allergic rhinitis   . Hyperlipidemia   . Obesity   . OSA (obstructive sleep apnea)   . Osteoarthritis   . Uterine cancer   . COPD (chronic obstructive pulmonary disease)     cervical cancer  . Shortness of breath   . Cough with expectoration    coughs up phlegm early morning  . GERD (gastroesophageal reflux disease)   . Left bundle branch block      History  Smoking status  . Former Smoker -- 1.00 packs/day for 35 years  . Types: Cigarettes  . Quit date: 01/07/1988  Smokeless tobacco  . Never Used    History  Alcohol Use No    Comment: not since 1988     Allergies  Allergen Reactions  . Latex     REACTION: itching  . Ace Inhibitors     REACTION: cough  . Hydrochlorothiazide     REACTION: decreased sodium  . Penicillins     REACTION: intolerant due to yeast infection    Current Outpatient Prescriptions  Medication Sig Dispense Refill  . Ascorbic Acid (VITAMIN C PO) Take 1 tablet by mouth daily.      Marland Kitchen aspirin EC 81 MG tablet Take 81 mg by mouth daily.      . budesonide (PULMICORT) 0.25 MG/2ML nebulizer solution Take 2 mLs (0.25 mg total) by nebulization 2 (two) times daily.  60 mL  12  . calcium-vitamin D (OSCAL WITH D) 500-200 MG-UNIT per tablet Take 1 tablet by mouth every other day.      . ceFEPIme 2 g in dextrose 5 % 50 mL Inject 2 g into the vein every 12 (twelve) hours. Last dose is to be given on 05/11/2013.  54 g  0  . cetirizine (ZYRTEC) 10 MG tablet Take 10 mg by mouth daily.      . Cholecalciferol (VITAMIN D PO) Take 1 tablet by mouth daily.       . famotidine (PEPCID) 20 MG tablet Take 20 mg by mouth at bedtime. One at bedtime      . ferrous sulfate 325 (65 FE) MG tablet Take 1 tablet (325 mg total) by mouth daily with breakfast. For one month then  stop.    0  . folic acid (FOLVITE) 627 MCG tablet Take 800 mcg by mouth daily.      Marland Kitchen olmesartan (BENICAR) 40 MG tablet Take 20 mg by mouth daily.      Marland Kitchen omeprazole (PRILOSEC) 40 MG capsule Take 40 mg by mouth daily.      . polyethylene glycol (MIRALAX / GLYCOLAX) packet Take 17 g by mouth daily as needed.      . simvastatin (ZOCOR) 20 MG tablet Take 20 mg by mouth at bedtime.        Marland Kitchen venlafaxine (EFFEXOR-XR) 75 MG 24 hr capsule Take 75 mg by mouth  daily.        Marland Kitchen albuterol (VENTOLIN HFA) 108 (90 BASE) MCG/ACT inhaler Inhale 2 puffs into the lungs every 6 (six) hours as needed for wheezing or shortness of breath.       . fluticasone (FLONASE) 50 MCG/ACT nasal spray Place 2 sprays into the nose 2 (two) times daily as needed for allergies.      Marland Kitchen guaiFENesin (MUCINEX) 600 MG 12 hr tablet Take 2 tablets (1,200 mg total) by mouth 2 (two) times daily as needed.       No current facility-administered medications for this visit.       Physical Exam: BP 168/79  Pulse 90  Resp 18  Ht 5\' 2"  (1.575 m)  Wt 228 lb (103.42 kg)  BMI 41.69 kg/m2  SpO2 96%  General appearance: alert, cooperative and appears older than stated age Neurologic: intact Heart: regular rate and rhythm, S1, S2 normal, no murmur, click, rub or gallop Lungs: diminished breath sounds bibasilar Abdomen: soft, non-tender; bowel sounds normal; no masses,  no organomegaly Extremities: edema Mild ankle edema Wound: Her left chest port sites and incision are well-healed without evidence of drainage erythema or infection   Diagnostic Studies & Laboratory data:     Recent Radiology Findings:   Dg Chest 2 View  05/05/2013   CLINICAL DATA:  History of left lung surgery for lung carcinoma, some shortness of breath and weakness  EXAM: CHEST  2 VIEW  COMPARISON:  Chest x-ray of 04/27/2013  FINDINGS: The probable loculated left mid to anterior hydro pneumothorax is unchanged. No air is seen over the left apex where a small amount of fluid is now present. The right lung is clear. Heart size is stable. Right-sided PICC line remains.  IMPRESSION: Little change in the loculated left hydro pneumothorax.   Electronically Signed   By: Ivar Drape M.D.   On: 05/05/2013 12:38      Recent Lab Findings: Lab Results  Component Value Date   WBC 10.5 04/27/2013   HGB 9.4* 04/27/2013   HCT 29.3* 04/27/2013   PLT 274 04/27/2013   GLUCOSE 117* 04/27/2013   ALT 7 04/14/2013   AST 19 04/14/2013   NA  135* 04/27/2013   K 4.5 04/27/2013   CL 98 04/27/2013   CREATININE 0.49* 04/27/2013   BUN 11 04/27/2013   CO2 24  04/27/2013   INR 0.91 04/11/2013      Assessment / Plan:     Patient doing well postoperatively, without evidence of infection currently completing a two-week course of IV antibiotic Patient complains of constipation , likely related to pain medication and iron supplementation . She will start taking iron every other day and has started on MiraLAX   plan to see her back in 4 weeks with a followup chest x-ray    [name] MD      [address] [place] [address] Office [phone]   Beeper [phone]  05/05/2013 3:08 PM

## 2013-05-06 ENCOUNTER — Encounter: Payer: Self-pay | Admitting: *Deleted

## 2013-05-06 NOTE — [facility] Oncology Navigator Note (Signed)
I called patient to check in.  She is currently at [facility] for rehab.  She reports that she is doing well and making good progress.  She anticipates being there at least another week.  She says that she is not coughing for the first time in 5 years and she is so grateful.  She denied any questions or concerns at this time.  I encouraged her to call me for any needs.

## 2013-05-10 ENCOUNTER — Encounter: Payer: Self-pay | Admitting: Adult Health

## 2013-05-10 ENCOUNTER — Ambulatory Visit: Payer: Medicare Other | Admitting: Adult Health

## 2013-05-10 VITALS — BP 126/84 | HR 75 | Temp 98.1°F | Ht 62.0 in | Wt 228.3 lb

## 2013-05-10 DIAGNOSIS — J4489 Other specified chronic obstructive pulmonary disease: Secondary | ICD-10-CM

## 2013-05-10 DIAGNOSIS — J449 Chronic obstructive pulmonary disease, unspecified: Secondary | ICD-10-CM

## 2013-05-10 DIAGNOSIS — J209 Acute bronchitis, unspecified: Secondary | ICD-10-CM | POA: Insufficient documentation

## 2013-05-10 DIAGNOSIS — C3410 Malignant neoplasm of upper lobe, unspecified bronchus or lung: Secondary | ICD-10-CM

## 2013-05-10 MED ORDER — TIOTROPIUM BROMIDE MONOHYDRATE 18 MCG IN CAPS: 18.0000 ug | ORAL_CAPSULE | Freq: Every day | RESPIRATORY_TRACT | Status: DC

## 2013-05-10 NOTE — Patient Instructions (Signed)
Restart Spiriva 1 puff daily when you return home next week.  Finish Antibiotics as directed.  Follow up Dr. Lamonte Sakai  In 3 weeks and As needed   Please contact office for sooner follow up if symptoms do not improve or worsen or seek emergency care

## 2013-05-10 NOTE — Assessment & Plan Note (Signed)
S/p wedge resection 04/2013 by Servando Snare  Doing well in rehab cxr last week w/ stable post op changes. Cont follow up in 4 weeks w/ cxr with Servando Snare.

## 2013-05-10 NOTE — Assessment & Plan Note (Signed)
Restart Spiriva 1 puff daily when you return home next week.  Follow up Dr. Lamonte Sakai  In 3 weeks and As needed   Please contact office for sooner follow up if symptoms do not improve or worsen or seek emergency care

## 2013-05-10 NOTE — Progress Notes (Signed)
73 yo woman, former smoker, with obesity, HTN, RA, allergic rhinitis, OSA dx by PSG not currently on CPAP due to intolerance of high pressure. Referred for evaluation for chronic cough.   Has been seen for chronic cough by Dr Lady Deutscher for cough over the last 2 yrs. Prior to that she did not cough frequently, only with URIs. She reports persistent cough for about 2 yrs, usuallly non-prod but sometimes white to clear, appears to be worse during the winter months. Has also had PND and allergies that have remained bothersome. Was tried on zyrtec, also on Advair, QVAR at one point, not on these currently. Was treated for the last 2 months empirically for GERD with lansoprasole, off it now. She sometimes wakes at night with cough, PND. No real voice changes, some globus sensation and throat clearing.   ROV 07/20/09 -- returns for cough and exertional dyspnea. We started loratadine + fluticasone + NSWs, also omeprazole. Cough is better, still with nasal congestion and some gtt.   ROV 11/22/10 -- Seen previously for dyspnea and chronic cough.Also hx untreated OSA.  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 Her cough got better with rx PND and GERD, she was seen by Dr Wilburn Cornelia - eval consistent w GERD related cough.  Current regimen = omeprazole (stopped fluticasone and loratadine). Her cough is better now, she believes that stopping Ca has made this better. Still has a little cough.  Her biggest complaint is progressive exertional dyspnea since July '12. Notices it with walking. Her eval has included clear CXR's, normal ECG.  She had a stress test in 2002 (?). Her wt has been stable.  She was told to go back on QVAR 2 weeks ago -> no change in her SOB. Has not used SABA.  She still believes that she cannot do CPAP due to the high required pressures. She has never seen a cardiologist formally, interested in seeing Dr Stanford Breed.   ROV 12/27/10 -- Returns for evaluation exertional  SOB in setting mixed disease, OSA (not on CPAP). We started Spiriva about 6 weeks ago, but she isn't sure that it has really helped. Maybe a small benefit in that it takes shorter time to recover. Use albuterol prn and finds that this helps her more. She saw Dr Stanford Breed as planned, underwent stress testing as below. Cough is stable, no better or worse.   Nuclear stress testing 12/20 by Dr Stanford Breed showed abnormal stress nuclear study with a small partially reversible distal anterior defect suggestive of soft tissue attenuation and mild ischemia.  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 Followed by Dr Stanford Breed, had a Myoview w possible evidence anterior ischemia. Underwent R and L heart cath on 1/23. RVP 46/8/16, PAP 40/16/26, PAOP 18/15/13.  I can't find the L cath results, but pt tells me that she was told that she didn't have any coronary occlusions. She is willing to go back to the sleep lab for titration and mask fitting. Still taking Spiriva, may be helping some but remains SOB. She isn't sure that the financial cost is worth the benefit.   ROV 05/05/11 -- OSA, mild AFL with marginal BD response, diastolic dysfxn. Since last visit we started CPAP - she is wearing and tolerating. She is using full face mask - dislikes it but is able to do it. She has more energy, feels better. She tried stopping Spiriva  to see if she would miss it - she may have missed it some, she has not restarted it. She went to nutritionist, has lost some wt.   ROV 11/03/11 -- OSA, mild AFL with marginal BD response, diastolic dysfxn. Since last time she has taken a break from her CPAP - hasn;t used for about 2 months due to mask difficulties and the fact that she doesn;t like it. She got out of the habit. She is willing and wants to get back on it - not sure that she misses it, but maybe was more rested. She has lost some more wt.  She remains on Spiriva, is getting  samples.   ROV 05/04/12 -- Hx RA, OSA, mild AFL with marginal BD response, diastolic dysfxn, borderline secondary PAH. She is using her zyrtec and fluticasone nasal spray. Her cough is better, breathing is better. She remains on omeprazole. She was not able/willing to get back to regular CPAP. She has lost approx 30 lbs over 9-12 months. MTX and remicade for her RA. Last CXR was 1/'13, no ILD. She is on Spiriva, uses albuterol very rarely. No AE reported.   ROV 03/10/13 -- Hx RA on immunosuppression, OSA, mild AFL with marginal BD response, diastolic dysfxn, borderline secondary PAH. She has been seen most recently by T. Kie Calvin and following a LUL cavitary lesion on CT scan and PET, noted to be hypermetabolic 7/51/02. There is a smaller lingular area of hypermetabolism.  She went for IR biopsy and this had to be stopped due to hemoptysis. Her case was discussed at Thoracic conference, she could be a candidate for resection > cath in 1/'13 showed no significant obstructive CAD.  >>no changes   05/10/2013 [facility] follow up  Patient was admitted April 7 through April 23 for left upper lobe VATS  With wedge resection for stage I squamous cell carcinoma. (lymph nodes neg )  Hospital stay. Was complicated by Haemophilus influenza bronchitis and Escherichia coli urinary tract infection. Along surgical wound on the left. Chest wall with increased redness, with wound culture showing Pseudomonas She was treated with prolonged antibiotics, and discharged On Maxipime for 14 days via PICC line. Of note, patient has underlying rheumatoid arthritis and is on chronic immunosuppression. Prior to admission Seen by Dr. Servando Snare on 4/30 w. Chest x-ray  showed little change in the loculated left hydropneumothorax. No changes made in therapy.  Says since getting out of hospital that she is not coughing-has had a cough for 39yrs . Can't believe her cough is gone.  Has 1 day left of IV abx tomorrow.  Currently in rehab ,  undergoing PT. Still very weak.  Plans to go home next week. Was on Spiriva PTA .  Currently on nebs at rehab.  Patient denies any hemoptysis, orthopnea, PND, or increased leg swelling.     ROS:  Constitutional:   No  weight loss, night sweats,  Fevers, chills,  +fatigue, or  lassitude.  HEENT:   No headaches,  Difficulty swallowing,  Tooth/dental problems, or  Sore throat,                No sneezing, itching, ear ache, nasal congestion, post nasal drip,   CV:  No chest pain,  Orthopnea, PND, swelling in lower extremities, anasarca, dizziness, palpitations, syncope.   GI  No heartburn, indigestion, abdominal pain, nausea, vomiting, diarrhea, change in bowel habits, loss of appetite, bloody stools.   Resp:    No chest wall deformity  Skin: no rash  or lesions.  GU: no dysuria, change in color of urine, no urgency or frequency.  No flank pain, no hematuria   MS:  No joint pain or swelling.  No decreased range of motion.  No back pain.  Psych:  No change in mood or affect. No depression or anxiety.  No memory loss.        EXAM:  Gen: Pleasant, obese, in no distress,  normal affect, in wheelchair   ENT: No lesions,  mouth clear,  oropharynx clear, no postnasal drip  Neck: No JVD, no TMG, no carotid bruits  Lungs: No use of accessory muscles, no dullness to percussion, diminished BS in bases  Along left chest wall healed surgical scar.   Cardiovascular: RRR, heart sounds normal, no murmur or gallops, tr-1+  peripheral edema  Musculoskeletal: No deformities, no cyanosis or clubbing  Neuro: alert, non focal  Skin: Warm, no lesions or rashes

## 2013-05-10 NOTE — Assessment & Plan Note (Signed)
Recent hospitalization c/by H. Influ in sputum, Psuedomonas wound infection and kleb/ecoli UTI  Now finishing up prolonged IV abx via PICC Scheduled to finish complete course tomorrow.

## 2013-05-10 NOTE — Addendum Note (Signed)
Addended by: Parke Poisson E on: 05/10/2013 05:43 PM   Modules accepted: Orders

## 2013-05-13 ENCOUNTER — Non-Acute Institutional Stay (SKILLED_NURSING_FACILITY): Payer: Medicare Other | Admitting: Adult Health

## 2013-05-13 ENCOUNTER — Encounter: Payer: Self-pay | Admitting: Adult Health

## 2013-05-13 DIAGNOSIS — E7800 Pure hypercholesterolemia, unspecified: Secondary | ICD-10-CM

## 2013-05-13 DIAGNOSIS — F3289 Other specified depressive episodes: Secondary | ICD-10-CM

## 2013-05-13 DIAGNOSIS — I10 Essential (primary) hypertension: Secondary | ICD-10-CM

## 2013-05-13 DIAGNOSIS — J449 Chronic obstructive pulmonary disease, unspecified: Secondary | ICD-10-CM | POA: Diagnosis not present

## 2013-05-13 DIAGNOSIS — J4489 Other specified chronic obstructive pulmonary disease: Secondary | ICD-10-CM

## 2013-05-13 DIAGNOSIS — J309 Allergic rhinitis, unspecified: Secondary | ICD-10-CM

## 2013-05-13 DIAGNOSIS — F329 Major depressive disorder, single episode, unspecified: Secondary | ICD-10-CM

## 2013-05-13 DIAGNOSIS — C3410 Malignant neoplasm of upper lobe, unspecified bronchus or lung: Secondary | ICD-10-CM

## 2013-05-13 DIAGNOSIS — F32A Depression, unspecified: Secondary | ICD-10-CM

## 2013-05-13 DIAGNOSIS — D630 Anemia in neoplastic disease: Secondary | ICD-10-CM | POA: Diagnosis not present

## 2013-05-13 NOTE — Progress Notes (Signed)
Patient ID: Taylor Berry, female   DOB: 1940-07-16, 73 y.o.   MRN: [id number]                 PROGRESS NOTE  DATE: 05/13/13  FACILITY: Nursing Home Location: [facility]  LEVEL OF CARE: SNF (31)  Acute Visit  CHIEF COMPLAINT: Discharge Notes  HISTORY OF PRESENT ILLNESS: This is a 73 year old female who is for discharge home with Home health PT, OT and Nursing. DME: Rolling walker with seat. She has been admitted to [facility] on 04/28/13 from [facility] with new diagnosis of  Lung Cancer, Left Upper Lobe.  Patient was admitted to this facility for short-term rehabilitation after the patient's recent hospitalization.  Patient has completed SNF rehabilitation and therapy has cleared the patient for discharge.   REASSESSMENT OF ONGOING PROBLEM(S):  HTN: Pt 's HTN remains stable.  Denies CP, sob, DOE, pedal edema, headaches, dizziness or visual disturbances.  No complications from the medications currently being used.  Last BP : 140/68  ANEMIA: The anemia has been stable. The patient denies fatigue, melena or hematochezia. No complications from the medications currently being used. 4/15 hgb 10.0  DEPRESSION: The depression remains stable. Patient denies ongoing feelings of sadness, insomnia, anedhonia or lack of appetite. No complications reported from the medications currently being used. Staff do not report behavioral problems.  PAST MEDICAL HISTORY : Reviewed.  No changes/see problem list  CURRENT MEDICATIONS: Reviewed per MAR/see medication list  REVIEW OF SYSTEMS:  GENERAL: no change in appetite, no fatigue, no weight changes, no fever, chills or weakness RESPIRATORY: no cough, SOB, DOE, wheezing, hemoptysis CARDIAC: no chest pain,or palpitations, +edema GI: no abdominal pain, diarrhea, constipation, heart burn, nausea or vomiting  PHYSICAL EXAMINATION  GENERAL: no acute distress, obese NECK: supple, trachea midline, no neck masses, no thyroid  tenderness, no thyromegaly LYMPHATICS: no LAN in the neck, no supraclavicular LAN RESPIRATORY: breathing is even & unlabored, BS CTAB CARDIAC: RRR, no murmur,no extra heart sounds, BLE edema 2+ GI: abdomen soft, normal BS, no masses, no tenderness, no hepatomegaly, no splenomegaly EXTREMITIES:  Able to move all 4 extremities PSYCHIATRIC: the patient is alert & oriented to person, affect & behavior appropriate  LABS/RADIOLOGY: 05/03/13 WBC 7.8 hemoglobin 10.0 hematocrit 33.3 Labs reviewed: Basic Metabolic Panel:  Recent Labs  04/23/13 0415 04/25/13 1040 04/27/13 0520  NA 129* 130* 135*  K 4.8 4.4 4.5  CL 90* 93* 98  CO2 '25 26 24  '$ GLUCOSE 124* 119* 117*  BUN '13 9 11  '$ CREATININE 0.50 0.44* 0.49*  CALCIUM 8.9 8.4 9.0   Liver Function Tests:  Recent Labs  04/11/13 1458 04/14/13 0420  AST 24 19  ALT 9 7  ALKPHOS 57 51  BILITOT 0.3 0.8  PROT 7.8 6.5  ALBUMIN 3.3* 2.7*   CBC:  Recent Labs  04/24/13 0500 04/25/13 1040 04/27/13 0520  WBC 18.1* 10.5 10.5  NEUTROABS 13.1*  --   --   HGB 9.2* 9.0* 9.4*  HCT 28.4* 27.6* 29.3*  MCV 90.2 90.8 91.6  PLT PLATELET CLUMPS NOTED ON SMEAR, COUNT APPEARS ADEQUATE 267 274   CBG:  Recent Labs  02/17/13 1012  GLUCAP 111*    ASSESSMENT/PLAN:  Lung Cancer, left upper lobe status post wage recession of left upper lobe - for Home health PT, OT and Nursing COPD - stable; continue Pulmicort Anemia, acute blood loss - continue FeSO4 Allergic Rhinitis - continue Fluticasone Hypertension - well-controlled; continue Benicar Hyperlipidemia -  continue Zocor Depression - continue Effexor XR   I have filled out patient's discharge paperwork and written prescriptions.  Patient will receive home health PT, OT and Nursing.  DME provided: Rolling walker with seat  Total discharge time: Greater than 30 minutes Discharge time involved coordination of the discharge process with Education officer, museum, nursing staff and therapy department.  Medical justification for home health services/DME verified.   CPT CODE: 09811  Seth Bake - NP [facility] [phone]

## 2013-05-17 DIAGNOSIS — E669 Obesity, unspecified: Secondary | ICD-10-CM | POA: Diagnosis not present

## 2013-05-17 DIAGNOSIS — G4730 Sleep apnea, unspecified: Secondary | ICD-10-CM | POA: Diagnosis not present

## 2013-05-17 DIAGNOSIS — M069 Rheumatoid arthritis, unspecified: Secondary | ICD-10-CM | POA: Diagnosis not present

## 2013-05-17 DIAGNOSIS — I2789 Other specified pulmonary heart diseases: Secondary | ICD-10-CM | POA: Diagnosis not present

## 2013-05-17 DIAGNOSIS — M810 Age-related osteoporosis without current pathological fracture: Secondary | ICD-10-CM | POA: Diagnosis not present

## 2013-05-17 DIAGNOSIS — Z8541 Personal history of malignant neoplasm of cervix uteri: Secondary | ICD-10-CM | POA: Diagnosis not present

## 2013-05-17 DIAGNOSIS — J441 Chronic obstructive pulmonary disease with (acute) exacerbation: Secondary | ICD-10-CM | POA: Diagnosis not present

## 2013-05-17 DIAGNOSIS — Z85118 Personal history of other malignant neoplasm of bronchus and lung: Secondary | ICD-10-CM | POA: Diagnosis not present

## 2013-05-17 DIAGNOSIS — D649 Anemia, unspecified: Secondary | ICD-10-CM | POA: Diagnosis not present

## 2013-05-19 DIAGNOSIS — I2789 Other specified pulmonary heart diseases: Secondary | ICD-10-CM | POA: Diagnosis not present

## 2013-05-19 DIAGNOSIS — Z85118 Personal history of other malignant neoplasm of bronchus and lung: Secondary | ICD-10-CM | POA: Diagnosis not present

## 2013-05-19 DIAGNOSIS — D649 Anemia, unspecified: Secondary | ICD-10-CM | POA: Diagnosis not present

## 2013-05-19 DIAGNOSIS — Z8541 Personal history of malignant neoplasm of cervix uteri: Secondary | ICD-10-CM | POA: Diagnosis not present

## 2013-05-19 DIAGNOSIS — G4730 Sleep apnea, unspecified: Secondary | ICD-10-CM | POA: Diagnosis not present

## 2013-05-19 DIAGNOSIS — J441 Chronic obstructive pulmonary disease with (acute) exacerbation: Secondary | ICD-10-CM | POA: Diagnosis not present

## 2013-05-20 DIAGNOSIS — D649 Anemia, unspecified: Secondary | ICD-10-CM | POA: Diagnosis not present

## 2013-05-20 DIAGNOSIS — Z85118 Personal history of other malignant neoplasm of bronchus and lung: Secondary | ICD-10-CM | POA: Diagnosis not present

## 2013-05-20 DIAGNOSIS — G4730 Sleep apnea, unspecified: Secondary | ICD-10-CM | POA: Diagnosis not present

## 2013-05-20 DIAGNOSIS — Z8541 Personal history of malignant neoplasm of cervix uteri: Secondary | ICD-10-CM | POA: Diagnosis not present

## 2013-05-20 DIAGNOSIS — J441 Chronic obstructive pulmonary disease with (acute) exacerbation: Secondary | ICD-10-CM | POA: Diagnosis not present

## 2013-05-20 DIAGNOSIS — I2789 Other specified pulmonary heart diseases: Secondary | ICD-10-CM | POA: Diagnosis not present

## 2013-05-23 DIAGNOSIS — J441 Chronic obstructive pulmonary disease with (acute) exacerbation: Secondary | ICD-10-CM | POA: Diagnosis not present

## 2013-05-23 DIAGNOSIS — Z8541 Personal history of malignant neoplasm of cervix uteri: Secondary | ICD-10-CM | POA: Diagnosis not present

## 2013-05-23 DIAGNOSIS — I2789 Other specified pulmonary heart diseases: Secondary | ICD-10-CM | POA: Diagnosis not present

## 2013-05-23 DIAGNOSIS — G4730 Sleep apnea, unspecified: Secondary | ICD-10-CM | POA: Diagnosis not present

## 2013-05-23 DIAGNOSIS — Z85118 Personal history of other malignant neoplasm of bronchus and lung: Secondary | ICD-10-CM | POA: Diagnosis not present

## 2013-05-23 DIAGNOSIS — D649 Anemia, unspecified: Secondary | ICD-10-CM | POA: Diagnosis not present

## 2013-05-24 DIAGNOSIS — Z85118 Personal history of other malignant neoplasm of bronchus and lung: Secondary | ICD-10-CM | POA: Diagnosis not present

## 2013-05-24 DIAGNOSIS — J441 Chronic obstructive pulmonary disease with (acute) exacerbation: Secondary | ICD-10-CM | POA: Diagnosis not present

## 2013-05-24 DIAGNOSIS — I2789 Other specified pulmonary heart diseases: Secondary | ICD-10-CM | POA: Diagnosis not present

## 2013-05-24 DIAGNOSIS — Z8541 Personal history of malignant neoplasm of cervix uteri: Secondary | ICD-10-CM | POA: Diagnosis not present

## 2013-05-24 DIAGNOSIS — G4730 Sleep apnea, unspecified: Secondary | ICD-10-CM | POA: Diagnosis not present

## 2013-05-24 DIAGNOSIS — D649 Anemia, unspecified: Secondary | ICD-10-CM | POA: Diagnosis not present

## 2013-05-25 DIAGNOSIS — J441 Chronic obstructive pulmonary disease with (acute) exacerbation: Secondary | ICD-10-CM | POA: Diagnosis not present

## 2013-05-25 DIAGNOSIS — Z85118 Personal history of other malignant neoplasm of bronchus and lung: Secondary | ICD-10-CM | POA: Diagnosis not present

## 2013-05-25 DIAGNOSIS — I2789 Other specified pulmonary heart diseases: Secondary | ICD-10-CM | POA: Diagnosis not present

## 2013-05-25 DIAGNOSIS — Z8541 Personal history of malignant neoplasm of cervix uteri: Secondary | ICD-10-CM | POA: Diagnosis not present

## 2013-05-25 DIAGNOSIS — D649 Anemia, unspecified: Secondary | ICD-10-CM | POA: Diagnosis not present

## 2013-05-25 DIAGNOSIS — G4730 Sleep apnea, unspecified: Secondary | ICD-10-CM | POA: Diagnosis not present

## 2013-05-26 DIAGNOSIS — D649 Anemia, unspecified: Secondary | ICD-10-CM | POA: Diagnosis not present

## 2013-05-26 DIAGNOSIS — G4730 Sleep apnea, unspecified: Secondary | ICD-10-CM | POA: Diagnosis not present

## 2013-05-26 DIAGNOSIS — J441 Chronic obstructive pulmonary disease with (acute) exacerbation: Secondary | ICD-10-CM | POA: Diagnosis not present

## 2013-05-26 DIAGNOSIS — Z85118 Personal history of other malignant neoplasm of bronchus and lung: Secondary | ICD-10-CM | POA: Diagnosis not present

## 2013-05-26 DIAGNOSIS — Z8541 Personal history of malignant neoplasm of cervix uteri: Secondary | ICD-10-CM | POA: Diagnosis not present

## 2013-05-26 DIAGNOSIS — I2789 Other specified pulmonary heart diseases: Secondary | ICD-10-CM | POA: Diagnosis not present

## 2013-05-27 DIAGNOSIS — J441 Chronic obstructive pulmonary disease with (acute) exacerbation: Secondary | ICD-10-CM | POA: Diagnosis not present

## 2013-05-27 DIAGNOSIS — Z85118 Personal history of other malignant neoplasm of bronchus and lung: Secondary | ICD-10-CM | POA: Diagnosis not present

## 2013-05-27 DIAGNOSIS — D649 Anemia, unspecified: Secondary | ICD-10-CM | POA: Diagnosis not present

## 2013-05-27 DIAGNOSIS — I2789 Other specified pulmonary heart diseases: Secondary | ICD-10-CM | POA: Diagnosis not present

## 2013-05-27 DIAGNOSIS — Z8541 Personal history of malignant neoplasm of cervix uteri: Secondary | ICD-10-CM | POA: Diagnosis not present

## 2013-05-27 DIAGNOSIS — G4730 Sleep apnea, unspecified: Secondary | ICD-10-CM | POA: Diagnosis not present

## 2013-05-29 DIAGNOSIS — Z85118 Personal history of other malignant neoplasm of bronchus and lung: Secondary | ICD-10-CM | POA: Diagnosis not present

## 2013-05-29 DIAGNOSIS — Z8541 Personal history of malignant neoplasm of cervix uteri: Secondary | ICD-10-CM | POA: Diagnosis not present

## 2013-05-29 DIAGNOSIS — G4730 Sleep apnea, unspecified: Secondary | ICD-10-CM | POA: Diagnosis not present

## 2013-05-29 DIAGNOSIS — I2789 Other specified pulmonary heart diseases: Secondary | ICD-10-CM | POA: Diagnosis not present

## 2013-05-29 DIAGNOSIS — J441 Chronic obstructive pulmonary disease with (acute) exacerbation: Secondary | ICD-10-CM | POA: Diagnosis not present

## 2013-05-29 DIAGNOSIS — D649 Anemia, unspecified: Secondary | ICD-10-CM | POA: Diagnosis not present

## 2013-05-31 ENCOUNTER — Ambulatory Visit: Payer: Medicare Other | Admitting: Emergency Medicine

## 2013-06-01 ENCOUNTER — Other Ambulatory Visit: Payer: Self-pay | Admitting: Cardiothoracic Surgery

## 2013-06-01 DIAGNOSIS — M810 Age-related osteoporosis without current pathological fracture: Secondary | ICD-10-CM | POA: Diagnosis not present

## 2013-06-01 DIAGNOSIS — I2789 Other specified pulmonary heart diseases: Secondary | ICD-10-CM | POA: Diagnosis not present

## 2013-06-01 DIAGNOSIS — E7800 Pure hypercholesterolemia, unspecified: Secondary | ICD-10-CM | POA: Diagnosis not present

## 2013-06-01 DIAGNOSIS — Z85118 Personal history of other malignant neoplasm of bronchus and lung: Secondary | ICD-10-CM | POA: Diagnosis not present

## 2013-06-01 DIAGNOSIS — Z8541 Personal history of malignant neoplasm of cervix uteri: Secondary | ICD-10-CM | POA: Diagnosis not present

## 2013-06-01 DIAGNOSIS — G4733 Obstructive sleep apnea (adult) (pediatric): Secondary | ICD-10-CM | POA: Diagnosis not present

## 2013-06-01 DIAGNOSIS — J441 Chronic obstructive pulmonary disease with (acute) exacerbation: Secondary | ICD-10-CM | POA: Diagnosis not present

## 2013-06-01 DIAGNOSIS — G4730 Sleep apnea, unspecified: Secondary | ICD-10-CM | POA: Diagnosis not present

## 2013-06-01 DIAGNOSIS — J42 Unspecified chronic bronchitis: Secondary | ICD-10-CM | POA: Diagnosis not present

## 2013-06-01 DIAGNOSIS — C3410 Malignant neoplasm of upper lobe, unspecified bronchus or lung: Secondary | ICD-10-CM

## 2013-06-01 DIAGNOSIS — M069 Rheumatoid arthritis, unspecified: Secondary | ICD-10-CM | POA: Diagnosis not present

## 2013-06-01 DIAGNOSIS — L723 Sebaceous cyst: Secondary | ICD-10-CM | POA: Diagnosis not present

## 2013-06-01 DIAGNOSIS — D649 Anemia, unspecified: Secondary | ICD-10-CM | POA: Diagnosis not present

## 2013-06-01 DIAGNOSIS — C3490 Malignant neoplasm of unspecified part of unspecified bronchus or lung: Secondary | ICD-10-CM | POA: Diagnosis not present

## 2013-06-01 DIAGNOSIS — I10 Essential (primary) hypertension: Secondary | ICD-10-CM | POA: Diagnosis not present

## 2013-06-02 ENCOUNTER — Ambulatory Visit (INDEPENDENT_AMBULATORY_CARE_PROVIDER_SITE_OTHER): Payer: Self-pay | Admitting: Cardiothoracic Surgery

## 2013-06-02 ENCOUNTER — Ambulatory Visit: Payer: Medicare Other | Admitting: Emergency Medicine

## 2013-06-02 ENCOUNTER — Ambulatory Visit
Admission: RE | Admit: 2013-06-02 | Discharge: 2013-06-02 | Disposition: A | Discharge status: Home / Self Care | Payer: Medicare Other | Source: Ambulatory Visit | Attending: Cardiothoracic Surgery | Admitting: Cardiothoracic Surgery

## 2013-06-02 ENCOUNTER — Encounter: Payer: Self-pay | Admitting: Cardiothoracic Surgery

## 2013-06-02 ENCOUNTER — Encounter: Payer: Self-pay | Admitting: Emergency Medicine

## 2013-06-02 VITALS — BP 130/76 | HR 85 | Ht 62.0 in | Wt 228.0 lb

## 2013-06-02 VITALS — BP 135/78 | HR 92 | Resp 18 | Ht 62.0 in | Wt 223.0 lb

## 2013-06-02 DIAGNOSIS — C3410 Malignant neoplasm of upper lobe, unspecified bronchus or lung: Secondary | ICD-10-CM

## 2013-06-02 DIAGNOSIS — J9383 Other pneumothorax: Secondary | ICD-10-CM | POA: Diagnosis not present

## 2013-06-02 DIAGNOSIS — J4489 Other specified chronic obstructive pulmonary disease: Secondary | ICD-10-CM

## 2013-06-02 DIAGNOSIS — J449 Chronic obstructive pulmonary disease, unspecified: Secondary | ICD-10-CM

## 2013-06-02 DIAGNOSIS — Z09 Encounter for follow-up examination after completed treatment for conditions other than malignant neoplasm: Secondary | ICD-10-CM

## 2013-06-02 NOTE — Patient Instructions (Signed)
Please continue your current medications  Walking oximetry today. We will set up oxygen for you to have at home. Use 2L/min with exertion. Our goal is for your SpO2 to be > 90%.  Follow with Dr Lamonte Sakai in 3 months or sooner if you have any problems.

## 2013-06-02 NOTE — Assessment & Plan Note (Addendum)
With superimposed restrictive disease post-op and due to her wt. Sh ei swilling to consider o2 with exertion. I think that this would help her rehab faster. She may not need it forever. Will order today. Continue her spiriva and other meds.

## 2013-06-02 NOTE — Progress Notes (Signed)
73 yo woman, former smoker, with obesity, HTN, RA, allergic rhinitis, OSA dx by PSG not currently on CPAP due to intolerance of high pressure. Referred for evaluation for chronic cough.   Has been seen for chronic cough by Dr Lady Deutscher for cough over the last 2 yrs. Prior to that she did not cough frequently, only with URIs. She reports persistent cough for about 2 yrs, usuallly non-prod but sometimes white to clear, appears to be worse during the winter months. Has also had PND and allergies that have remained bothersome. Was tried on zyrtec, also on Advair, QVAR at one point, not on these currently. Was treated for the last 2 months empirically for GERD with lansoprasole, off it now. She sometimes wakes at night with cough, PND. No real voice changes, some globus sensation and throat clearing.   ROV 07/20/09 -- returns for cough and exertional dyspnea. We started loratadine + fluticasone + NSWs, also omeprazole. Cough is better, still with nasal congestion and some gtt.   ROV 11/22/10 -- Seen previously for dyspnea and chronic cough.Also hx untreated OSA.  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 Her cough got better with rx PND and GERD, she was seen by Dr Wilburn Cornelia - eval consistent w GERD related cough.  Current regimen = omeprazole (stopped fluticasone and loratadine). Her cough is better now, she believes that stopping Ca has made this better. Still has a little cough.  Her biggest complaint is progressive exertional dyspnea since July '12. Notices it with walking. Her eval has included clear CXR's, normal ECG.  She had a stress test in 2002 (?). Her wt has been stable.  She was told to go back on QVAR 2 weeks ago -> no change in her SOB. Has not used SABA.  She still believes that she cannot do CPAP due to the high required pressures. She has never seen a cardiologist formally, interested in seeing Dr Stanford Breed.   ROV 12/27/10 -- Returns for evaluation exertional  SOB in setting mixed disease, OSA (not on CPAP). We started Spiriva about 6 weeks ago, but she isn't sure that it has really helped. Maybe a small benefit in that it takes shorter time to recover. Use albuterol prn and finds that this helps her more. She saw Dr Stanford Breed as planned, underwent stress testing as below. Cough is stable, no better or worse.   Nuclear stress testing 12/20 by Dr Stanford Breed showed abnormal stress nuclear study with a small partially reversible distal anterior defect suggestive of soft tissue attenuation and mild ischemia.  ROV 02/03/11 -- OSA (not on CPAP), mild AFL with marginal response to BD, diastolic dysfxn (by TTE 95/18). Followed by Dr Stanford Breed, had a Myoview w possible evidence anterior ischemia. Underwent R and L heart cath on 1/23. RVP 46/8/16, PAP 40/16/26, PAOP 18/15/13.  I can't find the L cath results, but pt tells me that she was told that she didn't have any coronary occlusions. She is willing to go back to the sleep lab for titration and mask fitting. Still taking Spiriva, may be helping some but remains SOB. She isn't sure that the financial cost is worth the benefit.   ROV 05/05/11 -- OSA, mild AFL with marginal BD response, diastolic dysfxn. Since last visit we started CPAP - she is wearing and tolerating. She is using full face mask - dislikes it but is able to do it. She has more energy, feels better. She tried stopping Spiriva  to see if she would miss it - she may have missed it some, she has not restarted it. She went to nutritionist, has lost some wt.   ROV 11/03/11 -- OSA, mild AFL with marginal BD response, diastolic dysfxn. Since last time she has taken a break from her CPAP - hasn;t used for about 2 months due to mask difficulties and the fact that she doesn;t like it. She got out of the habit. She is willing and wants to get back on it - not sure that she misses it, but maybe was more rested. She has lost some more wt.  She remains on Spiriva, is getting  samples.   ROV 05/04/12 -- Hx RA, OSA, mild AFL with marginal BD response, diastolic dysfxn, borderline secondary PAH. She is using her zyrtec and fluticasone nasal spray. Her cough is better, breathing is better. She remains on omeprazole. She was not able/willing to get back to regular CPAP. She has lost approx 30 lbs over 9-12 months. MTX and remicade for her RA. Last CXR was 1/'13, no ILD. She is on Spiriva, uses albuterol very rarely. No AE reported.   ROV 03/10/13 -- Hx RA on immunosuppression, OSA, mild AFL with marginal BD response, diastolic dysfxn, borderline secondary PAH. She has been seen most recently by T. Parrett and following a LUL cavitary lesion on CT scan and PET, noted to be hypermetabolic 5/85/27. There is a smaller lingular area of hypermetabolism.  She went for IR biopsy and this had to be stopped due to hemoptysis. Her case was discussed at Thoracic conference, she could be a candidate for resection > cath in 1/'13 showed no significant obstructive CAD.  >>no changes   [facility] follow up 05/10/13 --  Patient was admitted April 7 through April 23 for left upper lobe VATS  With wedge resection for stage I squamous cell carcinoma. (lymph nodes neg )  Hospital stay. Was complicated by Haemophilus influenza bronchitis and Escherichia coli urinary tract infection. Along surgical wound on the left. Chest wall with increased redness, with wound culture showing Pseudomonas She was treated with prolonged antibiotics, and discharged On Maxipime for 14 days via PICC line. Of note, patient has underlying rheumatoid arthritis and is on chronic immunosuppression. Prior to admission Seen by Dr. Servando Snare on 4/30 w. Chest x-ray  showed little change in the loculated left hydropneumothorax. No changes made in therapy.  Says since getting out of hospital that she is not coughing-has had a cough for 10yrs . Can't believe her cough is gone.  Has 1 day left of IV abx tomorrow.  Currently in rehab ,  undergoing PT. Still very weak.  Plans to go home next week. Was on Spiriva PTA .  Currently on nebs at rehab.  Patient denies any hemoptysis, orthopnea, PND, or increased leg swelling.  ROV 06/02/13 --  Hx of RA on immunosuppression, OSA, mild AFL with marginal BD response, diastolic dysfxn, borderline secondary PAH. She is new ly dx with squamous cell lung CA post-resection by Dr Servando Snare. She was treated with IV abx post op for pseudomonal chest wound infxn. She has completed the abx, PICC is out. She is building up her strength, working with PT.  She is on Spiriva, albuterol prn - hasn't required. Uses zyrtec. She has noted desaturations after walking about 275ft.     EXAM:  Filed Vitals:   06/02/13 1139  BP: 130/76  Pulse: 85  Height: 5\' 2"  (1.575 m)  Weight: 228 lb (103.42 kg)  SpO2: 96%   Gen: Pleasant, obese, in no distress,  normal affect, in wheelchair   ENT: No lesions,  mouth clear,  oropharynx clear, no postnasal drip  Neck: No JVD, no TMG, no carotid bruits  Lungs: No use of accessory muscles, no dullness to percussion, diminished BS in bases  Along left chest wall healed surgical scar.   Cardiovascular: RRR, heart sounds normal, no murmur or gallops, tr-1+  peripheral edema  Musculoskeletal: No deformities, no cyanosis or clubbing  Neuro: alert, non focal  Skin: Warm, no lesions or rashes   06/02/13 --  COMPARISON: 05/05/2013  FINDINGS:  Postoperative loculated air in the left mid lateral chest is smaller  by chest x-ray. Otherwise stable postoperative changes of the left  lung. No edema, significant airspace consolidation or pleural  effusion identified. There is mild atelectasis at the right lung  base.  IMPRESSION:  Decrease in loculated pneumothorax of the left chest  postoperatively  COPD With superimposed restrictive disease post-op and due to her wt. Sh ei swilling to consider o2 with exertion. I think that this would help her rehab faster. She may  not need it forever. Will order today. Continue her spiriva and other meds.

## 2013-06-02 NOTE — Progress Notes (Signed)
[address]       [place] [address]             [phone]      Jude C Marinos [place] Medical Record [id number] Date of Birth: 15-Jun-1940  Referring: Collene Gobble, MD Primary Care: Osborne Casco, MD  Chief Complaint:   POST OP FOLLOW UP 04/12/2013  PREOPERATIVE DIAGNOSIS: Squamous cell carcinoma of the left upper lobe  with limited pulmonary reserve.  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  SURGICAL PROCEDURE: Video bronchoscopy, left video-assisted  thoracoscopy, mini-thoracotomy, wedge resection of left upper lobe with  lymph node dissection, and biopsy of chest wall.  SURGEON: Lanelle Bal, MD  Lung cancer, left upper lobe- Squamous cell    Primary site: Lung (Left)   Staging method: AJCC 7th Edition   Pathologic: Stage IB (T2a, N0, cM0) signed by [name], MD on 04/14/2013  8:31 AM   Summary: Stage IB (T2a, N0, cM0)  History of Present Illness:     Patient returns to the office today with followup chest x-ray following  wedge resection of left upper lobe for squamous cell carcinoma stage IB on 04/12/2013.  During her hospital course she developed Klebsiella in her sputum, Escherichia coli in her urine, Pseudomonas at the left chest incision site. She was initially discharged home to [facility] since discharge the patient looks much improved. She's comfortable at rest in the office not on oxygen. She continues to have some lower extremity edema but this is improving. She has had no wound drainage. She does have some drop in her oxygen saturation with exercise but recovers quickly. Overall her physical endurance continues to improve. She has had some flareup of her rheumatoid arthritis but is not interested in resuming methotrexate or Remicade.  Zubrod Score: At the time of surgery this patient's most appropriate activity status/level should be described as: '[]'$      0    Normal activity, no symptoms '[]'$     1    Restricted in physical strenuous activity but ambulatory, able to do out light work '[x]'$     2    Ambulatory and capable of self care, unable to do work activities, up and about >50 % of waking hours                              '[]'$     3    Only limited self care, in bed greater than 50% of waking hours '[]'$     4    Completely disabled, no self care, confined to bed or chair '[]'$     5    Moribund    Past Medical History  Diagnosis Date  . Hypertension   . Rheumatoid arthritis(714.0)   . Osteoporosis   . Allergic rhinitis   . Hyperlipidemia   . Obesity   . OSA (obstructive sleep apnea)   . Osteoarthritis   . Uterine cancer   . COPD (chronic obstructive pulmonary disease)     cervical cancer  . Shortness of breath   . Cough with expectoration     coughs up phlegm early morning  . GERD (gastroesophageal reflux disease)   . Left bundle branch block      History  Smoking status  . Former Smoker -- 1.00 packs/day for 35 years  . Types: Cigarettes  . Quit date: 01/07/1988  Smokeless tobacco  . Never Used    History  Alcohol Use No    Comment: not since 1988     Allergies  Allergen Reactions  . Latex     REACTION: itching  . Ace Inhibitors     REACTION: cough  . Hydrochlorothiazide     REACTION: decreased sodium  . Penicillins     REACTION: intolerant due to yeast infection    Current Outpatient Prescriptions  Medication Sig Dispense Refill  . albuterol (VENTOLIN HFA) 108 (90 BASE) MCG/ACT inhaler Inhale 2 puffs into the lungs every 6 (six) hours as needed for wheezing or shortness of breath.       Marland Kitchen aspirin EC 81 MG tablet Take 81 mg by mouth daily.      . calcium-vitamin D (OSCAL WITH D) 500-200 MG-UNIT per tablet Take 1 tablet by mouth every other day.      . cetirizine (ZYRTEC) 10 MG tablet Take 10 mg by mouth daily.      . cholecalciferol (VITAMIN D) 1000 UNITS tablet Take 1,000 Units by mouth daily.      . famotidine  (PEPCID) 20 MG tablet Take 20 mg by mouth at bedtime.       . ferrous sulfate 325 (65 FE) MG tablet Take 1 tablet (325 mg total) by mouth daily with breakfast. For one month then  stop.    0  . fluticasone (FLONASE) 50 MCG/ACT nasal spray Place 2 sprays into the nose 2 (two) times daily as needed for allergies.      . folic acid (FOLVITE) Q000111Q MCG tablet Take 800 mcg by mouth daily.      Marland Kitchen olmesartan (BENICAR) 40 MG tablet Take 20 mg by mouth daily.      Marland Kitchen omeprazole (PRILOSEC) 40 MG capsule Take 40 mg by mouth daily.      . simvastatin (ZOCOR) 20 MG tablet Take 20 mg by mouth at bedtime.        Marland Kitchen tiotropium (SPIRIVA) 18 MCG inhalation capsule Place 1 capsule (18 mcg total) into inhaler and inhale daily.  30 capsule  6  . venlafaxine (EFFEXOR-XR) 75 MG 24 hr capsule Take 75 mg by mouth daily.        . vitamin C (ASCORBIC ACID) 500 MG tablet Take 500 mg by mouth every other day.        No current facility-administered medications for this visit.       Physical Exam: BP 135/78  Pulse 92  Resp 18  Ht '5\' 2"'$  (1.575 m)  Wt 223 lb (101.152 kg)  BMI 40.78 kg/m2  SpO2 96%  General appearance: alert, cooperative and appears older than stated age Neurologic: intact Heart: regular rate and rhythm, S1, S2 normal, no murmur, click, rub or gallop Lungs: diminished breath sounds bibasilar Abdomen: soft, non-tender; bowel sounds normal; no masses,  no organomegaly Extremities: edema Mild ankle edema Wound: Her left chest port sites and incision are well-healed without evidence of drainage erythema or infection   Diagnostic Studies & Laboratory data:     Recent Radiology Findings:   Dg Chest 2 View  06/02/2013   CLINICAL DATA:  Status post wedge resection of left upper lobe squamous cell carcinoma in April.  EXAM: CHEST - 2 VIEW  COMPARISON:  05/05/2013  FINDINGS: Postoperative loculated air in the left mid lateral chest is smaller by chest x-ray. Otherwise stable postoperative changes of the  left lung. No edema, significant airspace consolidation or pleural effusion identified. There is  mild atelectasis at the right lung base.  IMPRESSION: Decrease in loculated pneumothorax of the left chest postoperatively.   Electronically Signed   By: Aletta Edouard M.D.   On: 06/02/2013 09:26      Recent Lab Findings: Lab Results  Component Value Date   WBC 10.5 04/27/2013   HGB 9.4* 04/27/2013   HCT 29.3* 04/27/2013   PLT 274 04/27/2013   GLUCOSE 117* 04/27/2013   ALT 7 04/14/2013   AST 19 04/14/2013   NA 135* 04/27/2013   K 4.5 04/27/2013   CL 98 04/27/2013   CREATININE 0.49* 04/27/2013   BUN 11 04/27/2013   CO2 24 04/27/2013   INR 0.91 04/11/2013      Assessment / Plan:   Patient is now 6 weeks following wedge resection of stage IB squamous cell carcinoma of the left upper lobe. She's recovering from a prolonged postoperative course related to her severe underlying lung disease and rheumatoid arthritis.  Plan to see her back in 5 months with a followup CT scan of the chest for restaging and following her stage IB squamous cell carcinoma of the left upper lobe. She has an appointment in the pulmonary clinic this afternoon.    [name] MD      [address] [place] [address] Office [phone]   Beeper [phone]  06/02/2013 10:02 AM

## 2013-06-03 ENCOUNTER — Ambulatory Visit: Payer: Medicare Other | Admitting: Emergency Medicine

## 2013-06-03 ENCOUNTER — Telehealth: Payer: Self-pay | Admitting: Emergency Medicine

## 2013-06-03 DIAGNOSIS — M159 Polyosteoarthritis, unspecified: Secondary | ICD-10-CM | POA: Diagnosis not present

## 2013-06-03 DIAGNOSIS — J449 Chronic obstructive pulmonary disease, unspecified: Secondary | ICD-10-CM

## 2013-06-03 DIAGNOSIS — M810 Age-related osteoporosis without current pathological fracture: Secondary | ICD-10-CM | POA: Diagnosis not present

## 2013-06-03 DIAGNOSIS — C3490 Malignant neoplasm of unspecified part of unspecified bronchus or lung: Secondary | ICD-10-CM | POA: Diagnosis not present

## 2013-06-03 DIAGNOSIS — M069 Rheumatoid arthritis, unspecified: Secondary | ICD-10-CM | POA: Diagnosis not present

## 2013-06-03 NOTE — Telephone Encounter (Signed)
Spoke with Melissa at [facility]- she states to put order in to evaluate patient for portable oxygen and they will go out to her home. I have spoke with patient and she is aware of order placed and [facility] to contact her. Nothing more needed at this time.

## 2013-06-06 DIAGNOSIS — J441 Chronic obstructive pulmonary disease with (acute) exacerbation: Secondary | ICD-10-CM | POA: Diagnosis not present

## 2013-06-06 DIAGNOSIS — D649 Anemia, unspecified: Secondary | ICD-10-CM | POA: Diagnosis not present

## 2013-06-06 DIAGNOSIS — Z85118 Personal history of other malignant neoplasm of bronchus and lung: Secondary | ICD-10-CM | POA: Diagnosis not present

## 2013-06-06 DIAGNOSIS — Z8541 Personal history of malignant neoplasm of cervix uteri: Secondary | ICD-10-CM | POA: Diagnosis not present

## 2013-06-06 DIAGNOSIS — I2789 Other specified pulmonary heart diseases: Secondary | ICD-10-CM | POA: Diagnosis not present

## 2013-06-06 DIAGNOSIS — G4730 Sleep apnea, unspecified: Secondary | ICD-10-CM | POA: Diagnosis not present

## 2013-06-08 DIAGNOSIS — D649 Anemia, unspecified: Secondary | ICD-10-CM | POA: Diagnosis not present

## 2013-06-08 DIAGNOSIS — G4730 Sleep apnea, unspecified: Secondary | ICD-10-CM | POA: Diagnosis not present

## 2013-06-08 DIAGNOSIS — Z85118 Personal history of other malignant neoplasm of bronchus and lung: Secondary | ICD-10-CM | POA: Diagnosis not present

## 2013-06-08 DIAGNOSIS — J441 Chronic obstructive pulmonary disease with (acute) exacerbation: Secondary | ICD-10-CM | POA: Diagnosis not present

## 2013-06-08 DIAGNOSIS — Z8541 Personal history of malignant neoplasm of cervix uteri: Secondary | ICD-10-CM | POA: Diagnosis not present

## 2013-06-08 DIAGNOSIS — I2789 Other specified pulmonary heart diseases: Secondary | ICD-10-CM | POA: Diagnosis not present

## 2013-06-13 DIAGNOSIS — J441 Chronic obstructive pulmonary disease with (acute) exacerbation: Secondary | ICD-10-CM | POA: Diagnosis not present

## 2013-06-13 DIAGNOSIS — G4730 Sleep apnea, unspecified: Secondary | ICD-10-CM | POA: Diagnosis not present

## 2013-06-13 DIAGNOSIS — Z8541 Personal history of malignant neoplasm of cervix uteri: Secondary | ICD-10-CM | POA: Diagnosis not present

## 2013-06-13 DIAGNOSIS — Z85118 Personal history of other malignant neoplasm of bronchus and lung: Secondary | ICD-10-CM | POA: Diagnosis not present

## 2013-06-13 DIAGNOSIS — D649 Anemia, unspecified: Secondary | ICD-10-CM | POA: Diagnosis not present

## 2013-06-13 DIAGNOSIS — I2789 Other specified pulmonary heart diseases: Secondary | ICD-10-CM | POA: Diagnosis not present

## 2013-06-14 DIAGNOSIS — Z8541 Personal history of malignant neoplasm of cervix uteri: Secondary | ICD-10-CM | POA: Diagnosis not present

## 2013-06-14 DIAGNOSIS — J441 Chronic obstructive pulmonary disease with (acute) exacerbation: Secondary | ICD-10-CM | POA: Diagnosis not present

## 2013-06-14 DIAGNOSIS — D649 Anemia, unspecified: Secondary | ICD-10-CM | POA: Diagnosis not present

## 2013-06-14 DIAGNOSIS — Z85118 Personal history of other malignant neoplasm of bronchus and lung: Secondary | ICD-10-CM | POA: Diagnosis not present

## 2013-06-14 DIAGNOSIS — G4730 Sleep apnea, unspecified: Secondary | ICD-10-CM | POA: Diagnosis not present

## 2013-06-14 DIAGNOSIS — I2789 Other specified pulmonary heart diseases: Secondary | ICD-10-CM | POA: Diagnosis not present

## 2013-06-16 ENCOUNTER — Telehealth: Payer: Self-pay | Admitting: Emergency Medicine

## 2013-06-16 NOTE — Telephone Encounter (Signed)
Called made pt aware no samples at this time. She will check next week. Nothing further needed

## 2013-06-22 DIAGNOSIS — I2789 Other specified pulmonary heart diseases: Secondary | ICD-10-CM | POA: Diagnosis not present

## 2013-06-22 DIAGNOSIS — G4730 Sleep apnea, unspecified: Secondary | ICD-10-CM | POA: Diagnosis not present

## 2013-06-22 DIAGNOSIS — Z8541 Personal history of malignant neoplasm of cervix uteri: Secondary | ICD-10-CM | POA: Diagnosis not present

## 2013-06-22 DIAGNOSIS — Z85118 Personal history of other malignant neoplasm of bronchus and lung: Secondary | ICD-10-CM | POA: Diagnosis not present

## 2013-06-22 DIAGNOSIS — D649 Anemia, unspecified: Secondary | ICD-10-CM | POA: Diagnosis not present

## 2013-06-22 DIAGNOSIS — J441 Chronic obstructive pulmonary disease with (acute) exacerbation: Secondary | ICD-10-CM | POA: Diagnosis not present

## 2013-06-23 ENCOUNTER — Ambulatory Visit (INDEPENDENT_AMBULATORY_CARE_PROVIDER_SITE_OTHER): Payer: Medicare Other | Admitting: Podiatry

## 2013-06-23 ENCOUNTER — Encounter: Payer: Self-pay | Admitting: Podiatry

## 2013-06-23 ENCOUNTER — Telehealth: Payer: Self-pay | Admitting: Emergency Medicine

## 2013-06-23 VITALS — BP 137/72 | HR 88 | Resp 18 | Ht 62.0 in | Wt 226.0 lb

## 2013-06-23 DIAGNOSIS — B351 Tinea unguium: Secondary | ICD-10-CM

## 2013-06-23 DIAGNOSIS — M79609 Pain in unspecified limb: Secondary | ICD-10-CM | POA: Diagnosis not present

## 2013-06-23 DIAGNOSIS — M79673 Pain in unspecified foot: Secondary | ICD-10-CM

## 2013-06-23 NOTE — Telephone Encounter (Signed)
Called and spoke to pt. Pt requesting samples of spiriva handihaler. Informed pt that we do not have any spiriva handihaler samples. Pt stated she will call again next week to see if we have any. Nothing further needed.

## 2013-06-23 NOTE — Progress Notes (Signed)
She presents today with a chief complaint of painful elongated toenails.  Objective: Pulses are palpable bilateral. Nails are thick yellow dystrophic with mycotic and painful palpation.  Assessment: Pain in limb secondary to onychomycosis 1 through 5 bilateral.  Plan: Debridement of nails 1 through 5 bilateral secondary to pain.

## 2013-06-24 DIAGNOSIS — Z85118 Personal history of other malignant neoplasm of bronchus and lung: Secondary | ICD-10-CM | POA: Diagnosis not present

## 2013-06-24 DIAGNOSIS — D649 Anemia, unspecified: Secondary | ICD-10-CM | POA: Diagnosis not present

## 2013-06-24 DIAGNOSIS — G4730 Sleep apnea, unspecified: Secondary | ICD-10-CM | POA: Diagnosis not present

## 2013-06-24 DIAGNOSIS — J441 Chronic obstructive pulmonary disease with (acute) exacerbation: Secondary | ICD-10-CM | POA: Diagnosis not present

## 2013-06-24 DIAGNOSIS — Z8541 Personal history of malignant neoplasm of cervix uteri: Secondary | ICD-10-CM | POA: Diagnosis not present

## 2013-06-24 DIAGNOSIS — I2789 Other specified pulmonary heart diseases: Secondary | ICD-10-CM | POA: Diagnosis not present

## 2013-06-28 ENCOUNTER — Other Ambulatory Visit: Payer: Self-pay

## 2013-06-28 DIAGNOSIS — C44721 Squamous cell carcinoma of skin of unspecified lower limb, including hip: Secondary | ICD-10-CM | POA: Diagnosis not present

## 2013-06-28 DIAGNOSIS — Z85828 Personal history of other malignant neoplasm of skin: Secondary | ICD-10-CM | POA: Diagnosis not present

## 2013-07-11 ENCOUNTER — Telehealth: Payer: Self-pay | Admitting: Emergency Medicine

## 2013-07-11 DIAGNOSIS — J449 Chronic obstructive pulmonary disease, unspecified: Secondary | ICD-10-CM

## 2013-07-11 DIAGNOSIS — J4489 Other specified chronic obstructive pulmonary disease: Secondary | ICD-10-CM

## 2013-07-11 NOTE — Telephone Encounter (Signed)
Called and spoke with pt and she stated that she is needing [place] to send an order to [facility] so they can come and pick up her oxygen.  She stated that she is not using this anymore and needs this picked up.  RB please advise if ok to send in an order for this.  Looks like we just started her on oxygen back 05/2013 to use 2 liters with activity.  Please advise. Thanks  Allergies  Allergen Reactions  . Latex     REACTION: itching  . Ace Inhibitors     REACTION: cough  . Hydrochlorothiazide     REACTION: decreased sodium  . Penicillins     REACTION: intolerant due to yeast infection    Current Outpatient Prescriptions on File Prior to Visit  Medication Sig Dispense Refill  . albuterol (VENTOLIN HFA) 108 (90 BASE) MCG/ACT inhaler Inhale 2 puffs into the lungs every 6 (six) hours as needed for wheezing or shortness of breath.       Marland Kitchen aspirin EC 81 MG tablet Take 81 mg by mouth daily.      . calcium-vitamin D (OSCAL WITH D) 500-200 MG-UNIT per tablet Take 1 tablet by mouth every other day.      . cetirizine (ZYRTEC) 10 MG tablet Take 10 mg by mouth daily.      . cholecalciferol (VITAMIN D) 1000 UNITS tablet Take 1,000 Units by mouth daily.      . famotidine (PEPCID) 20 MG tablet Take 20 mg by mouth at bedtime.       . fluticasone (FLONASE) 50 MCG/ACT nasal spray Place 2 sprays into the nose 2 (two) times daily as needed for allergies.      . folic acid (FOLVITE) Q000111Q MCG tablet Take 800 mcg by mouth daily.      . Methotrexate Sodium (METHOTREXATE PO) Take by mouth.      . olmesartan (BENICAR) 40 MG tablet Take 20 mg by mouth daily.      Marland Kitchen omeprazole (PRILOSEC) 40 MG capsule Take 40 mg by mouth daily.      . predniSONE (DELTASONE) 10 MG tablet Take 10 mg by mouth daily with breakfast.      . simvastatin (ZOCOR) 20 MG tablet Take 20 mg by mouth at bedtime.        Marland Kitchen tiotropium (SPIRIVA) 18 MCG inhalation capsule Place 1 capsule (18 mcg total) into inhaler and inhale daily.  30 capsule  6  . venlafaxine  (EFFEXOR-XR) 75 MG 24 hr capsule Take 75 mg by mouth daily.        . vitamin C (ASCORBIC ACID) 500 MG tablet Take 500 mg by mouth every other day.        No current facility-administered medications on file prior to visit.

## 2013-07-11 NOTE — Telephone Encounter (Signed)
Spoke with pt and made her aware order placed with [facility] to pick up O2 ok per RB Order placed and nothing further needed

## 2013-07-11 NOTE — Telephone Encounter (Signed)
Ok to write order to d/c O2

## 2013-07-13 DIAGNOSIS — Z8541 Personal history of malignant neoplasm of cervix uteri: Secondary | ICD-10-CM | POA: Diagnosis not present

## 2013-07-13 DIAGNOSIS — Z85118 Personal history of other malignant neoplasm of bronchus and lung: Secondary | ICD-10-CM | POA: Diagnosis not present

## 2013-07-13 DIAGNOSIS — I2789 Other specified pulmonary heart diseases: Secondary | ICD-10-CM | POA: Diagnosis not present

## 2013-07-13 DIAGNOSIS — D649 Anemia, unspecified: Secondary | ICD-10-CM | POA: Diagnosis not present

## 2013-07-13 DIAGNOSIS — G4730 Sleep apnea, unspecified: Secondary | ICD-10-CM | POA: Diagnosis not present

## 2013-07-13 DIAGNOSIS — M810 Age-related osteoporosis without current pathological fracture: Secondary | ICD-10-CM | POA: Diagnosis not present

## 2013-07-13 DIAGNOSIS — C3490 Malignant neoplasm of unspecified part of unspecified bronchus or lung: Secondary | ICD-10-CM | POA: Diagnosis not present

## 2013-07-13 DIAGNOSIS — J441 Chronic obstructive pulmonary disease with (acute) exacerbation: Secondary | ICD-10-CM | POA: Diagnosis not present

## 2013-07-13 DIAGNOSIS — M159 Polyosteoarthritis, unspecified: Secondary | ICD-10-CM | POA: Diagnosis not present

## 2013-07-13 DIAGNOSIS — M069 Rheumatoid arthritis, unspecified: Secondary | ICD-10-CM | POA: Diagnosis not present

## 2013-07-15 DIAGNOSIS — I2789 Other specified pulmonary heart diseases: Secondary | ICD-10-CM | POA: Diagnosis not present

## 2013-07-15 DIAGNOSIS — Z85118 Personal history of other malignant neoplasm of bronchus and lung: Secondary | ICD-10-CM | POA: Diagnosis not present

## 2013-07-15 DIAGNOSIS — Z8541 Personal history of malignant neoplasm of cervix uteri: Secondary | ICD-10-CM | POA: Diagnosis not present

## 2013-07-18 ENCOUNTER — Other Ambulatory Visit: Payer: Self-pay | Admitting: *Deleted

## 2013-07-18 DIAGNOSIS — C3410 Malignant neoplasm of upper lobe, unspecified bronchus or lung: Secondary | ICD-10-CM

## 2013-07-19 ENCOUNTER — Ambulatory Visit
Admission: RE | Admit: 2013-07-19 | Discharge: 2013-07-19 | Disposition: A | Discharge status: Home / Self Care | Payer: Medicare Other | Source: Ambulatory Visit | Attending: Cardiothoracic Surgery | Admitting: Cardiothoracic Surgery

## 2013-07-19 DIAGNOSIS — R079 Chest pain, unspecified: Secondary | ICD-10-CM | POA: Diagnosis not present

## 2013-07-20 ENCOUNTER — Other Ambulatory Visit: Payer: Self-pay | Admitting: Cardiothoracic Surgery

## 2013-07-20 DIAGNOSIS — C3410 Malignant neoplasm of upper lobe, unspecified bronchus or lung: Secondary | ICD-10-CM

## 2013-07-21 ENCOUNTER — Encounter: Payer: Self-pay | Admitting: Cardiothoracic Surgery

## 2013-07-21 ENCOUNTER — Ambulatory Visit (INDEPENDENT_AMBULATORY_CARE_PROVIDER_SITE_OTHER): Payer: Medicare Other | Admitting: Cardiothoracic Surgery

## 2013-07-21 VITALS — BP 137/85 | HR 90 | Resp 20 | Ht 62.0 in | Wt 226.0 lb

## 2013-07-21 DIAGNOSIS — C3410 Malignant neoplasm of upper lobe, unspecified bronchus or lung: Secondary | ICD-10-CM | POA: Diagnosis not present

## 2013-07-21 DIAGNOSIS — Z8541 Personal history of malignant neoplasm of cervix uteri: Secondary | ICD-10-CM

## 2013-07-21 NOTE — Progress Notes (Signed)
[address]       [place] [address]             [phone]      Taylor Berry [place] Medical Record [id number] Date of Birth: 14-Oct-1940  Referring: Collene Gobble, MD Primary Care: Osborne Casco, MD  Chief Complaint:   POST OP FOLLOW UP 04/12/2013  PREOPERATIVE DIAGNOSIS: Squamous cell carcinoma of the left upper lobe  with limited pulmonary reserve.  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  SURGICAL PROCEDURE: Video bronchoscopy, left video-assisted  thoracoscopy, mini-thoracotomy, wedge resection of left upper lobe with  lymph node dissection, and biopsy of chest wall.  SURGEON: Lanelle Bal, MD  Lung cancer, left upper lobe- Squamous cell    Primary site: Lung (Left)   Staging method: AJCC 7th Edition   Pathologic: Stage IB (T2a, N0, cM0) signed by [name], MD on 04/14/2013  8:31 AM   Summary: Stage IB (T2a, N0, cM0)  History of Present Illness:     Patient returns to the office today with followup chest x-ray following  wedge resection of left upper lobe for squamous cell carcinoma stage IB on 04/12/2013.  During her hospital course she developed Klebsiella in her sputum, Escherichia coli in her urine, Pseudomonas at the left chest incision site. She was initially discharged home to [facility] since discharge the patient looks much improved. She's comfortable at rest in the office not on oxygen. . She has had  flareup of her rheumatoid arthritis  and is back on methotrexate, and a short course of steroids.  She comes to the office today in followup after chest x-ray done 2 days ago. She called the office with vague sensation of air moving in her left chest and a chest x-ray was obtained. She's had no fever chills the incision and chest tube sites are all well-healed without any leakage of fluid or air. There is no sign of infection  Zubrod Score: At the time  of surgery this patient's most appropriate activity status/level should be described as: '[]'$     0    Normal activity, no symptoms '[]'$     1    Restricted in physical strenuous activity but ambulatory, able to do out light work '[x]'$     2    Ambulatory and capable of self care, unable to do work activities, up and about >50 % of waking hours                              '[]'$     3    Only limited self care, in bed greater than 50% of waking hours '[]'$     4    Completely disabled, no self care, confined to bed or chair '[]'$     5    Moribund    Past Medical History  Diagnosis Date  . Hypertension   . Rheumatoid arthritis(714.0)   . Osteoporosis   . Allergic rhinitis   . Hyperlipidemia   . Obesity   . OSA (obstructive sleep apnea)   . Osteoarthritis   . Uterine cancer   . COPD (chronic obstructive pulmonary disease)     cervical cancer  . Shortness of breath   . Cough with expectoration     coughs up phlegm early morning  . GERD (gastroesophageal reflux disease)   . Left bundle branch block  History  Smoking status  . Former Smoker -- 1.00 packs/day for 35 years  . Types: Cigarettes  . Quit date: 01/07/1988  Smokeless tobacco  . Never Used    History  Alcohol Use No    Comment: not since 1988     Allergies  Allergen Reactions  . Latex     REACTION: itching  . Ace Inhibitors     REACTION: cough  . Hydrochlorothiazide     REACTION: decreased sodium  . Penicillins     REACTION: intolerant due to yeast infection    Current Outpatient Prescriptions  Medication Sig Dispense Refill  . albuterol (VENTOLIN HFA) 108 (90 BASE) MCG/ACT inhaler Inhale 2 puffs into the lungs every 6 (six) hours as needed for wheezing or shortness of breath.       Marland Kitchen aspirin EC 81 MG tablet Take 81 mg by mouth daily.      . calcium-vitamin D (OSCAL WITH D) 500-200 MG-UNIT per tablet Take 1 tablet by mouth every other day.      . cetirizine (ZYRTEC) 10 MG tablet Take 10 mg by mouth daily.      .  cholecalciferol (VITAMIN D) 1000 UNITS tablet Take 1,000 Units by mouth daily.      . famotidine (PEPCID) 20 MG tablet Take 20 mg by mouth at bedtime.       . fluticasone (FLONASE) 50 MCG/ACT nasal spray Place 2 sprays into the nose 2 (two) times daily as needed for allergies.      . folic acid (FOLVITE) Q000111Q MCG tablet Take 800 mcg by mouth daily.      . Methotrexate Sodium (METHOTREXATE PO) Take by mouth.      . olmesartan (BENICAR) 40 MG tablet Take 20 mg by mouth daily.      Marland Kitchen omeprazole (PRILOSEC) 40 MG capsule Take 40 mg by mouth daily.      . predniSONE (DELTASONE) 10 MG tablet Take 10 mg by mouth daily with breakfast.      . simvastatin (ZOCOR) 20 MG tablet Take 20 mg by mouth at bedtime.        Marland Kitchen tiotropium (SPIRIVA) 18 MCG inhalation capsule Place 1 capsule (18 mcg total) into inhaler and inhale daily.  30 capsule  6  . venlafaxine (EFFEXOR-XR) 75 MG 24 hr capsule Take 75 mg by mouth daily.        . vitamin C (ASCORBIC ACID) 500 MG tablet Take 500 mg by mouth every other day.        No current facility-administered medications for this visit.       Physical Exam: BP 137/85  Pulse 90  Resp 20  Ht '5\' 2"'$  (1.575 m)  Wt 226 lb (102.513 kg)  BMI 41.33 kg/m2  SpO2 95%  General appearance: alert, cooperative and appears older than stated age Neurologic: intact Heart: regular rate and rhythm, S1, S2 normal, no murmur, click, rub or gallop Lungs: diminished breath sounds bibasilar Abdomen: soft, non-tender; bowel sounds normal; no masses,  no organomegaly Extremities: edema Mild ankle edema Wound: Her left chest port sites and incision are well-healed without evidence of drainage erythema or infection On palpation of the left chest wall posteriorly a slight amount of crepitance is appreciated.  Diagnostic Studies & Laboratory data:     Recent Radiology Findings:  Dg Chest 2 View  07/19/2013   CLINICAL DATA:  Pain and suspected gas in the region of lateral left chest wall  incision  EXAM: CHEST  2 VIEW  COMPARISON:  PA and lateral chest x-ray of Jun 02, 2013  FINDINGS: There is air in the subcutaneous tissues of the left axillary region. Adjacent to this there is a focal stable appearing pleural space air collection at the site of the previous wedge resection. Suture material is noted in the medial aspect of this gas collection. There is no mediastinal shift. There is no pleural effusion. No acute abnormality elsewhere on the left nor of the right lung is demonstrated. The heart and pulmonary vascularity are normal. Are ununited fractures associated with the fifth and sixth ribs laterally and a healing fracture of the eighth rib.  IMPRESSION: 1. There is subcutaneous gas which may communicate with a stable appearing pleural based gas collection at the site of previous surgery in the left mid thorax laterally. 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 There are unhealed fractures of the adjacent left fifth,sixth, and eighth ribs laterally. 2. The lungs elsewhere are clear and exhibit emphysematous change. There is no evidence of CHF. There is no pleural effusion. 3. Chest CT scanning with or without contrast may be useful. 4. These results will be called to the ordering clinician or representative by the Radiologist Assistant, and communication documented in the PACS or zVision Dashboard.   Electronically Signed   By: David  Martinique   On: 07/19/2013 11:35    Recent Lab Findings: Lab Results  Component Value Date   WBC 10.5 04/27/2013   HGB 9.4* 04/27/2013   HCT 29.3* 04/27/2013   PLT 274 04/27/2013   GLUCOSE 117* 04/27/2013   ALT 7 04/14/2013   AST 19 04/14/2013   NA 135* 04/27/2013   K 4.5 04/27/2013   CL 98 04/27/2013   CREATININE 0.49* 04/27/2013   BUN 11 04/27/2013   CO2 24 04/27/2013   INR 0.91 04/11/2013      Assessment / Plan:   Patient is now 3 months following wedge resection of stage IB squamous cell carcinoma of the left upper lobe. She's recovering from a prolonged  postoperative course related to her severe underlying lung disease and rheumatoid arthritis.  Plan to see her back in 3-4 weeks with a followup chest x-ray  She appears to have a small pneumatocele with connection to the subcutaneous tissue, without evidence of infection. At this point will treat with observation only and no surgical intervention    [name] MD      [address] [place] [address] Office [phone]   Beeper [phone]  07/21/2013 3:40 PM

## 2013-08-04 ENCOUNTER — Telehealth: Payer: Self-pay | Admitting: Emergency Medicine

## 2013-08-04 MED ORDER — TIOTROPIUM BROMIDE MONOHYDRATE 18 MCG IN CAPS: 18.0000 ug | ORAL_CAPSULE | Freq: Every day | RESPIRATORY_TRACT | Status: DC

## 2013-08-04 NOTE — Telephone Encounter (Signed)
Pt aware samples left pick up. Nothing further needed

## 2013-08-04 NOTE — Telephone Encounter (Signed)
Taylor Berry pt.  She reports she had surgery for lung cancer in April. She is now on methotrexate. Did not do any radiation/chemo. She is wanting to get her hair colored and wants to make sure this is okay to do so. She reports she was told it was not safe. Please advise as Taylor Berry is off. thanks

## 2013-08-04 NOTE — Telephone Encounter (Signed)
Per CY-yes its okay to get her hair colored. Thanks.

## 2013-08-04 NOTE — Telephone Encounter (Signed)
ATC line busy wcb 

## 2013-08-04 NOTE — Telephone Encounter (Signed)
Called made pt aware. Nothing further needed 

## 2013-08-08 ENCOUNTER — Encounter: Payer: Self-pay | Admitting: *Deleted

## 2013-08-08 NOTE — [facility] Oncology Navigator Note (Signed)
I called patient to check in.  Patient reports that she is doing well.  We discussed her most recent follow up visit with Dr. Servando Snare and she shared that there is some concern about air in her thorax and they are going to monitor and observe her for now.  We reviewed that she is to see him again on 08/18/13.  Patient denies any questions or concerns at this time.  I encouraged her to call me for any needs.  Patient verbalized understanding.

## 2013-08-18 ENCOUNTER — Ambulatory Visit: Payer: Medicare Other | Admitting: Cardiothoracic Surgery

## 2013-08-22 ENCOUNTER — Ambulatory Visit (INDEPENDENT_AMBULATORY_CARE_PROVIDER_SITE_OTHER): Payer: Medicare Other | Admitting: Physician Assistant

## 2013-08-22 ENCOUNTER — Ambulatory Visit
Admission: RE | Admit: 2013-08-22 | Discharge: 2013-08-22 | Disposition: A | Discharge status: Home / Self Care | Payer: Medicare Other | Source: Ambulatory Visit | Attending: Cardiothoracic Surgery | Admitting: Cardiothoracic Surgery

## 2013-08-22 VITALS — BP 129/81 | HR 90 | Resp 16 | Ht 62.0 in | Wt 216.5 lb

## 2013-08-22 DIAGNOSIS — J984 Other disorders of lung: Secondary | ICD-10-CM | POA: Diagnosis not present

## 2013-08-22 DIAGNOSIS — Z09 Encounter for follow-up examination after completed treatment for conditions other than malignant neoplasm: Secondary | ICD-10-CM

## 2013-08-22 DIAGNOSIS — C3410 Malignant neoplasm of upper lobe, unspecified bronchus or lung: Secondary | ICD-10-CM

## 2013-08-22 NOTE — Progress Notes (Signed)
  HPI:  Ms. Polkowski presents for follow up of suspected pneumatocele.  She was last evaluated by Dr. Servando Snare in July at which time she had no evidence of acute infection and it was felt repeat CXR should be obtained in several weeks.  The patient is doing very well.  He denies shortness of breath, fevers, chills, or productive cough.  Her wounds are healed with no evidence of infection present.  She does have several questions reguarding documentation in My Chart and all of these were addressed.   Current Outpatient Prescriptions  Medication Sig Dispense Refill  . albuterol (VENTOLIN HFA) 108 (90 BASE) MCG/ACT inhaler Inhale 2 puffs into the lungs every 6 (six) hours as needed for wheezing or shortness of breath.       Marland Kitchen aspirin EC 81 MG tablet Take 81 mg by mouth daily.      . calcium-vitamin D (OSCAL WITH D) 500-200 MG-UNIT per tablet Take 1 tablet by mouth every other day.      . cholecalciferol (VITAMIN D) 1000 UNITS tablet Take 1,000 Units by mouth daily.      . famotidine (PEPCID) 20 MG tablet Take 20 mg by mouth at bedtime.       . fluticasone (FLONASE) 50 MCG/ACT nasal spray Place 2 sprays into the nose 2 (two) times daily as needed for allergies.      . folic acid (FOLVITE) 174 MCG tablet Take 800 mcg by mouth daily.      . Methotrexate Sodium (METHOTREXATE PO) Take by mouth.      . olmesartan (BENICAR) 40 MG tablet Take 20 mg by mouth daily.      . simvastatin (ZOCOR) 20 MG tablet Take 20 mg by mouth at bedtime.        Marland Kitchen tiotropium (SPIRIVA) 18 MCG inhalation capsule Place 1 capsule (18 mcg total) into inhaler and inhale daily.  20 capsule  0  . venlafaxine (EFFEXOR-XR) 75 MG 24 hr capsule Take 75 mg by mouth daily.        . vitamin C (ASCORBIC ACID) 500 MG tablet Take 500 mg by mouth every other day.        No current facility-administered medications for this visit.    Physical Exam:  BP 129/81  Pulse 90  Resp 16  Ht 5\' 2"  (1.575 m)  Wt 216 lb 8 oz (98.204 kg)  BMI 39.59  kg/m2  SpO2 97%  Gen: no apparent distress Heart: RRR Lungs: CTA bilaterally Skin: incisions well healed  Diagnostic Tests:  CXR:  Overall stable in appearance.  There is a small air collection present along the left chest wall which has improved in size since previous film.  A/P:  1. Small Pneumatocele- appears smaller from previous film.  Patient's wounds are well healed and there is no evidence of infection in the chest tube tracks. 2. RTC in 6 weeks with CXR

## 2013-08-24 DIAGNOSIS — H2510 Age-related nuclear cataract, unspecified eye: Secondary | ICD-10-CM | POA: Diagnosis not present

## 2013-08-31 ENCOUNTER — Ambulatory Visit: Payer: Medicare Other | Admitting: Emergency Medicine

## 2013-08-31 ENCOUNTER — Encounter: Payer: Self-pay | Admitting: Emergency Medicine

## 2013-08-31 VITALS — BP 118/64 | HR 89 | Ht 62.0 in | Wt 214.0 lb

## 2013-08-31 DIAGNOSIS — M069 Rheumatoid arthritis, unspecified: Secondary | ICD-10-CM

## 2013-08-31 DIAGNOSIS — R059 Cough, unspecified: Secondary | ICD-10-CM

## 2013-08-31 DIAGNOSIS — J939 Pneumothorax, unspecified: Secondary | ICD-10-CM | POA: Insufficient documentation

## 2013-08-31 DIAGNOSIS — J449 Chronic obstructive pulmonary disease, unspecified: Secondary | ICD-10-CM

## 2013-08-31 DIAGNOSIS — R05 Cough: Secondary | ICD-10-CM

## 2013-08-31 DIAGNOSIS — J4489 Other specified chronic obstructive pulmonary disease: Secondary | ICD-10-CM

## 2013-08-31 DIAGNOSIS — C3412 Malignant neoplasm of upper lobe, left bronchus or lung: Secondary | ICD-10-CM

## 2013-08-31 NOTE — Progress Notes (Signed)
2011 >> 73 yo woman, former smoker, with obesity, HTN, RA, allergic rhinitis, OSA dx by [facility] not currently on CPAP due to intolerance of high pressure. Referred for evaluation for chronic cough.   Has been seen for chronic cough by Dr Lady Deutscher for cough over the last 2 yrs. Prior to that she did not cough frequently, only with URIs. She reports persistent cough for about 2 yrs, usuallly non-prod but sometimes white to clear, appears to be worse during the winter months. Has also had PND and allergies that have remained bothersome. Was tried on zyrtec, also on Advair, QVAR at one point, not on these currently. Was treated for the last 2 months empirically for GERD with lansoprasole, off it now. She sometimes wakes at night with cough, PND. No real voice changes, some globus sensation and throat clearing.    ROV 06/02/13 --  Hx of RA on immunosuppression, OSA, mild AFL with marginal BD response, diastolic dysfxn, borderline secondary PAH. She is new ly dx with squamous cell lung CA post-resection by Dr Servando Snare. She was treated with IV abx post op for pseudomonal chest wound infxn. She has completed the abx, PICC is out. She is building up her strength, working with PT.  She is on Spiriva, albuterol prn - hasn't required. Uses zyrtec. She has noted desaturations after walking about 270ft.   ROV 08/31/13 -- follow up visit for COPD and (untreated) OSA. She is on MTX for RA, is off prednisone. Diastolic dysfxn and recently treated stage 1 squamous cell lung CA. She has some residual pleural air that may be contiguous with air outside the chest wall > being followed by [facility]. She is doing well but is understandably worried about the pneumatocele. BD's are spiriva + prn SABA.     EXAM:  Filed Vitals:   08/31/13 1338  BP: 118/64  Pulse: 89  Height: 5\' 2"  (1.575 m)  Weight: 214 lb (97.07 kg)  SpO2: 94%   Gen: Pleasant, obese, in no distress,  normal affect, in wheelchair   ENT: No lesions,  mouth clear,   oropharynx clear, no postnasal drip  Neck: No JVD, no TMG, no carotid bruits  Lungs: No use of accessory muscles, diminished BS in bases. Few L crackles. No palpable subcutaneous air pocket.  Along left chest wall healed surgical scars.   Cardiovascular: RRR, heart sounds normal, no murmur or gallops, tr-1+  peripheral edema  Musculoskeletal: No deformities, no cyanosis or clubbing  Neuro: alert, non focal  Skin: Warm, no lesions or rashes   06/02/13 --  COMPARISON: 05/05/2013  FINDINGS:  Postoperative loculated air in the left mid lateral chest is smaller  by chest x-ray. Otherwise stable postoperative changes of the left  lung. No edema, significant airspace consolidation or pleural  effusion identified. There is mild atelectasis at the right lung  base.  IMPRESSION:  Decrease in loculated pneumothorax of the left chest  postoperatively  08/22/13 --  COMPARISON: PA and lateral chest of July 19, 2013. And Jun 02, 2013  FINDINGS:  There is postsurgical change in the left mid lung which appears  stable. No suspicious masses demonstrated. On the right there is  linear density in the infrahilar region which which is similar to  that demonstrated in May of 2015. The heart and pulmonary  vascularity within the limits of normal. There is no pleural  effusion. The bony thorax exhibits no acute abnormalities.  IMPRESSION:  There is a stable appearance of the chest since the  previous  studies     Lung cancer, left upper lobe Treated surgically. Following w Dr Servando Snare  COPD Continue Spiriva + SABA prn  ARTHRITIS, RHEUMATOID On MTX, no pred.   Cough Much improved  Pneumothorax, left Residual loculated air on CXR with ? Extrathoracic air. Looks stable on last CXR scan . No evidence wound abscess.

## 2013-08-31 NOTE — Assessment & Plan Note (Signed)
Much improved

## 2013-08-31 NOTE — Assessment & Plan Note (Signed)
Treated surgically. Following w Dr Servando Snare

## 2013-08-31 NOTE — Assessment & Plan Note (Signed)
On MTX, no pred.

## 2013-08-31 NOTE — Assessment & Plan Note (Signed)
Continue Spiriva + SABA prn

## 2013-08-31 NOTE — Patient Instructions (Addendum)
Please continue your Spiriva daily and your albuterol as needed Follow with Dr Servando Snare with CXR's regarding the healing of your left lung and resolution of your air / pneumatocele.  Follow with Dr Lamonte Sakai in 4 months or sooner if you have any problems.

## 2013-08-31 NOTE — Assessment & Plan Note (Signed)
Residual loculated air on CXR with ? Extrathoracic air. Looks stable on last CXR scan . No evidence wound abscess.

## 2013-09-06 DIAGNOSIS — L819 Disorder of pigmentation, unspecified: Secondary | ICD-10-CM | POA: Diagnosis not present

## 2013-09-06 DIAGNOSIS — L821 Other seborrheic keratosis: Secondary | ICD-10-CM | POA: Diagnosis not present

## 2013-09-06 DIAGNOSIS — D2370 Other benign neoplasm of skin of unspecified lower limb, including hip: Secondary | ICD-10-CM | POA: Diagnosis not present

## 2013-09-06 DIAGNOSIS — L723 Sebaceous cyst: Secondary | ICD-10-CM | POA: Diagnosis not present

## 2013-09-06 DIAGNOSIS — Z85828 Personal history of other malignant neoplasm of skin: Secondary | ICD-10-CM | POA: Diagnosis not present

## 2013-09-06 DIAGNOSIS — D239 Other benign neoplasm of skin, unspecified: Secondary | ICD-10-CM | POA: Diagnosis not present

## 2013-09-13 DIAGNOSIS — M810 Age-related osteoporosis without current pathological fracture: Secondary | ICD-10-CM | POA: Diagnosis not present

## 2013-09-13 DIAGNOSIS — C3490 Malignant neoplasm of unspecified part of unspecified bronchus or lung: Secondary | ICD-10-CM | POA: Diagnosis not present

## 2013-09-13 DIAGNOSIS — M159 Polyosteoarthritis, unspecified: Secondary | ICD-10-CM | POA: Diagnosis not present

## 2013-09-13 DIAGNOSIS — M069 Rheumatoid arthritis, unspecified: Secondary | ICD-10-CM | POA: Diagnosis not present

## 2013-09-22 ENCOUNTER — Telehealth: Payer: Self-pay | Admitting: Emergency Medicine

## 2013-09-22 ENCOUNTER — Encounter: Payer: Self-pay | Admitting: Podiatry

## 2013-09-22 ENCOUNTER — Ambulatory Visit (INDEPENDENT_AMBULATORY_CARE_PROVIDER_SITE_OTHER): Payer: Medicare Other | Admitting: Podiatry

## 2013-09-22 DIAGNOSIS — B351 Tinea unguium: Secondary | ICD-10-CM | POA: Diagnosis not present

## 2013-09-22 DIAGNOSIS — M79676 Pain in unspecified toe(s): Secondary | ICD-10-CM

## 2013-09-22 DIAGNOSIS — M79609 Pain in unspecified limb: Secondary | ICD-10-CM | POA: Diagnosis not present

## 2013-09-22 NOTE — Telephone Encounter (Signed)
2 boxes spiriva up front for pick up  Spoke with the pt and notified of this and she verbalized understanding

## 2013-09-22 NOTE — Progress Notes (Signed)
She presents today with a chief complaint of painful elongated toenails one through 5 bilateral.  Objective: Pulses are palpable bilateral. Nails are thick yellow dystrophic with mycotic painful palpation.  Assessment: Pain in limb secondary to onychomycosis 1-5 bilateral.  Plan: Debridement of nails 1 through 5 bilateral.

## 2013-09-30 ENCOUNTER — Other Ambulatory Visit: Payer: Self-pay | Admitting: Emergency Medicine

## 2013-10-05 ENCOUNTER — Other Ambulatory Visit: Payer: Self-pay | Admitting: Cardiothoracic Surgery

## 2013-10-05 DIAGNOSIS — C3410 Malignant neoplasm of upper lobe, unspecified bronchus or lung: Secondary | ICD-10-CM

## 2013-10-05 NOTE — Progress Notes (Signed)
[address]       [place] [address]             [phone]      Taylor Berry Medical Record [id number] Date of Birth: [birth year]  Referring: Collene Gobble, MD Primary Care: Osborne Casco, MD  Chief Complaint:   POST OP FOLLOW UP 04/12/2013  PREOPERATIVE DIAGNOSIS: Squamous cell carcinoma of the left upper lobe  with limited pulmonary reserve.  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  SURGICAL PROCEDURE: Video bronchoscopy, left video-assisted  thoracoscopy, mini-thoracotomy, wedge resection of left upper lobe with  lymph node dissection, and biopsy of chest wall.  SURGEON: Lanelle Bal, MD  Lung cancer, left upper lobe- Squamous cell    Primary site: Lung (Left)   Staging method: AJCC 7th Edition   Pathologic: Stage IB (T2a, N0, cM0) signed by [name], MD on 04/14/2013  8:31 AM   Summary: Stage IB (T2a, N0, cM0)  History of Present Illness:     Patient returns to the office today with followup CT scan 6 months following  wedge resection of left upper lobe for squamous cell carcinoma stage IB on 04/12/2013.  During her hospital course she developed Klebsiella in her sputum, Escherichia coli in her urine, Pseudomonas at the left chest incision site. She was initially discharged home to SNF .She comes to the office today in followup after chest x-ray done 2 days ago.  She notes that she has purposely been trying to lose weight and is down 24 pounds. She remains off oxygen She's had no hemoptysis.    Zubrod Score: At the time of surgery this patient's most appropriate activity status/level should be described as: '[]'$     0    Normal activity, no symptoms '[]'$     1    Restricted in physical strenuous activity but ambulatory, able to do out light work '[x]'$     2    Ambulatory and capable of self care, unable to do work activities, up and about >50 % of waking  hours                              '[]'$     3    Only limited self care, in bed greater than 50% of waking hours '[]'$     4    Completely disabled, no self care, confined to bed or chair '[]'$     5    Moribund    Past Medical History  Diagnosis Date  . Hypertension   . Rheumatoid arthritis(714.0)   . Osteoporosis   . Allergic rhinitis   . Hyperlipidemia   . Obesity   . OSA (obstructive sleep apnea)   . Osteoarthritis   . Uterine cancer   . COPD (chronic obstructive pulmonary disease)     cervical cancer  . Shortness of breath   . Cough with expectoration     coughs up phlegm early morning  . GERD (gastroesophageal reflux disease)   . Left bundle branch block      History  Smoking status  . Former Smoker -- 1.00 packs/day for 35 years  . Types: Cigarettes  . Quit date: 01/07/1988  Smokeless tobacco  . Never Used    History  Alcohol Use No    Comment: not since 1988     Allergies  Allergen Reactions  .  Latex     REACTION: itching  . Ace Inhibitors     REACTION: cough  . Hydrochlorothiazide     REACTION: decreased sodium  . Penicillins     REACTION: intolerant due to yeast infection    Current Outpatient Prescriptions  Medication Sig Dispense Refill  . albuterol (VENTOLIN HFA) 108 (90 BASE) MCG/ACT inhaler Inhale 2 puffs into the lungs every 6 (six) hours as needed for wheezing or shortness of breath.       Marland Kitchen aspirin EC 81 MG tablet Take 81 mg by mouth daily.      . calcium-vitamin D (OSCAL WITH D) 500-200 MG-UNIT per tablet Take 1 tablet by mouth every other day.      . cholecalciferol (VITAMIN D) 1000 UNITS tablet Take 1,000 Units by mouth daily.      . famotidine (PEPCID) 20 MG tablet Take 20 mg by mouth at bedtime.       . fluticasone (FLONASE) 50 MCG/ACT nasal spray PLACE 2 SPRAYS INTO THE NOSE TWICE DAILY AS NEEDED.  16 g  1  . folic acid (FOLVITE) Q000111Q MCG tablet Take 800 mcg by mouth daily.      . Methotrexate Sodium (METHOTREXATE PO) Take by mouth.      .  olmesartan (BENICAR) 40 MG tablet Take 20 mg by mouth daily.      . simvastatin (ZOCOR) 20 MG tablet Take 20 mg by mouth at bedtime.        Marland Kitchen tiotropium (SPIRIVA) 18 MCG inhalation capsule Place 1 capsule (18 mcg total) into inhaler and inhale daily.  20 capsule  0  . venlafaxine (EFFEXOR-XR) 75 MG 24 hr capsule Take 75 mg by mouth daily.         No current facility-administered medications for this visit.       Physical Exam: Ht '5\' 2"'$  (1.575 m)  Wt 214 lb (97.07 kg)  BMI 39.13 kg/m2  SpO2 %  General appearance: alert, cooperative and appears older than stated age Neurologic: intact Heart: regular rate and rhythm, S1, S2 normal, no murmur, click, rub or gallop Lungs: diminished breath sounds bibasilar Abdomen: soft, non-tender; bowel sounds normal; no masses,  no organomegaly Extremities: edema Mild ankle edema Wound: Her left chest port sites and incision are well-healed without evidence of drainage erythema or infection On palpation of the left chest wall posteriorly a slight amount of crepitance is appreciated.  Diagnostic Studies & Laboratory data:     Recent Radiology Findings:  Dg Chest 2 View  10/06/2013   CLINICAL DATA:  History of squamous cell carcinoma of the left upper lobe. Status post wedge resection of the left upper lobe with lymph node dissection and biopsy of the chest wall 04/12/2013.  EXAM: CHEST  2 VIEW  COMPARISON:  PA and lateral chest 08/22/2013, 07/19/2013 and 06/02/2013. CT chest 02/01/2013.  FINDINGS: Cystic lesion in the left upper lobe consistent with history of tumor section is unchanged. A very small amount of gas is seen along the left chest wall and the left breast, decreased since the prior exams. Pulmonary interstitium is somewhat coarsened but unchanged. Heart size is normal. No pneumothorax is identified. Left rib deformities are again seen.  IMPRESSION: No acute abnormality.  Postoperative change left upper lobe. Small volume of subcutaneous gas in  the left chest wall and left breast has decreased. No new abnormality since the prior examinations.   Electronically Signed   By: Inge Rise M.D.   On: 10/06/2013 11:54  Ct Chest Wo Contrast  10/06/2013   CLINICAL DATA:  Followup left lung carcinoma. 68month status post surgical resection.  EXAM: CT CHEST WITHOUT CONTRAST  TECHNIQUE: Multidetector CT imaging of the chest was performed following the standard protocol without IV contrast.  COMPARISON:  02/01/2013  FINDINGS: Mediastinum/Hilar Regions: No masses or pathologically enlarged lymph nodes identified.  Other Thoracic Lymphadenopathy:  None.  Lungs: Postop changes are noted in the posterior left upper lobe. A peripheral pneumatocele is seen which shows intercostal herniation at the surgical site. No pneumothorax identified. No suspicious pulmonary nodules or masses are identified. Mild bilateral lower lobe bronchiectasis noted, left side greater than right.  Pleura:  No evidence of effusion or mass.  Vascular/Cardiac: No thoracic aortic aneurysm or other significant abnormality identified.  Musculoskeletal:  No suspicious bone lesions identified.  Other:  None.  IMPRESSION: Postop changes in left upper lobe, with incidentally noted peripheral pneumatocele showing small intercostal herniation at the thoracotomy site.  No evidence of residual, recurrent, or metastatic carcinoma in the thorax.   Electronically Signed   By: JEarle GellM.D.   On: 10/06/2013 13:01    Dg Chest 2 View  07/19/2013   CLINICAL DATA:  Pain and suspected gas in the region of lateral left chest wall incision  EXAM: CHEST  2 VIEW  COMPARISON:  PA and lateral chest x-ray of Jun 02, 2013  FINDINGS: There is air in the subcutaneous tissues of the left axillary region. Adjacent to this there is a focal stable appearing pleural space air collection at the site of the previous wedge resection. Suture material is noted in the medial aspect of this gas collection. There is no  mediastinal shift. There is no pleural effusion. No acute abnormality elsewhere on the left nor of the right lung is demonstrated. The heart and pulmonary vascularity are normal. Are ununited fractures associated with the fifth and sixth ribs laterally and a healing fracture of the eighth rib.  IMPRESSION: 1. There is subcutaneous gas which may communicate with a stable appearing pleural based gas collection at the site of previous surgery in the left mid thorax laterally. 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 There are unhealed fractures of the adjacent left fifth,sixth, and eighth ribs laterally. 2. The lungs elsewhere are clear and exhibit emphysematous change. There is no evidence of CHF. There is no pleural effusion. 3. Chest CT scanning with or without contrast may be useful. 4. These results will be called to the ordering clinician or representative by the Radiologist Assistant, and communication documented in the PACS or zVision Dashboard.   Electronically Signed   By: David  JMartinique  On: 07/19/2013 11:35    Recent Lab Findings: Lab Results  Component Value Date   WBC 10.5 04/27/2013   HGB 9.4* 04/27/2013   HCT 29.3* 04/27/2013   PLT 274 04/27/2013   GLUCOSE 117* 04/27/2013   ALT 7 04/14/2013   AST 19 04/14/2013   NA 135* 04/27/2013   K 4.5 04/27/2013   CL 98 04/27/2013   CREATININE 0.49* 04/27/2013   BUN 11 04/27/2013   CO2 24 04/27/2013   INR 0.91 04/11/2013      Assessment / Plan:   Patient is now 6 months   following wedge resection of stage IB squamous cell carcinoma of the left upper lobe without evidence of recurrence on CT scan  She's recovering from a prolonged postoperative course related to her severe underlying lung disease and rheumatoid arthritis.   There is a pneumatocele  with small intercostal herniation which is stable   Plan to see back in 6 months with a followup CT which will be approximately one year postop  She notes she's up-to-date on pneumococcal vaccination, he is  encouraged to get her flu shot soon.  [name] MD      [address] [place] [address] Office [phone]   Beeper [id number]  10/06/2013 1:06 PM

## 2013-10-06 ENCOUNTER — Ambulatory Visit (INDEPENDENT_AMBULATORY_CARE_PROVIDER_SITE_OTHER): Payer: Medicare Other | Admitting: Cardiothoracic Surgery

## 2013-10-06 ENCOUNTER — Ambulatory Visit
Admission: RE | Admit: 2013-10-06 | Discharge: 2013-10-06 | Disposition: A | Discharge status: Home / Self Care | Payer: Medicare Other | Source: Ambulatory Visit | Attending: Cardiothoracic Surgery | Admitting: Cardiothoracic Surgery

## 2013-10-06 ENCOUNTER — Other Ambulatory Visit: Payer: Self-pay | Admitting: *Deleted

## 2013-10-06 ENCOUNTER — Encounter: Payer: Self-pay | Admitting: Cardiothoracic Surgery

## 2013-10-06 VITALS — BP 130/82 | HR 79 | Resp 20 | Ht 62.0 in | Wt 204.0 lb

## 2013-10-06 DIAGNOSIS — R918 Other nonspecific abnormal finding of lung field: Secondary | ICD-10-CM | POA: Diagnosis not present

## 2013-10-06 DIAGNOSIS — C3410 Malignant neoplasm of upper lobe, unspecified bronchus or lung: Secondary | ICD-10-CM

## 2013-10-06 DIAGNOSIS — Z23 Encounter for immunization: Secondary | ICD-10-CM | POA: Diagnosis not present

## 2013-10-06 DIAGNOSIS — C3412 Malignant neoplasm of upper lobe, left bronchus or lung: Secondary | ICD-10-CM

## 2013-10-06 DIAGNOSIS — Z85118 Personal history of other malignant neoplasm of bronchus and lung: Secondary | ICD-10-CM | POA: Diagnosis not present

## 2013-10-06 DIAGNOSIS — J449 Chronic obstructive pulmonary disease, unspecified: Secondary | ICD-10-CM | POA: Diagnosis not present

## 2013-10-06 DIAGNOSIS — J984 Other disorders of lung: Secondary | ICD-10-CM

## 2013-10-17 ENCOUNTER — Encounter: Payer: Self-pay | Admitting: *Deleted

## 2013-10-17 NOTE — [facility] Oncology Navigator Note (Signed)
Patient at [facility] with a friend who is undergoing treatment for lung cancer.  I met with patient who reports that she is doing very well.  She denies any problems or concerns at this time..  She reports that she is surprised she has done so well, especially with the initial complications after surgery and is happy she is able to help her friend.  I confirmed that she has my contact information. I encouraged her to call me for any questions or concerns.

## 2013-11-01 ENCOUNTER — Telehealth: Payer: Self-pay | Admitting: Emergency Medicine

## 2013-11-01 MED ORDER — TIOTROPIUM BROMIDE MONOHYDRATE 18 MCG IN CAPS
18.0000 ug | ORAL_CAPSULE | Freq: Every day | RESPIRATORY_TRACT | Status: DC
Start: 2013-11-01 — End: 2014-04-13

## 2013-11-01 NOTE — Telephone Encounter (Signed)
Samples at front. Pt is aware. [place] Bing, CMA

## 2013-11-03 ENCOUNTER — Ambulatory Visit: Payer: Medicare Other | Admitting: Cardiothoracic Surgery

## 2013-11-17 ENCOUNTER — Other Ambulatory Visit: Payer: Self-pay | Admitting: Internal Medicine

## 2013-12-13 DIAGNOSIS — M150 Primary generalized (osteo)arthritis: Secondary | ICD-10-CM | POA: Diagnosis not present

## 2013-12-13 DIAGNOSIS — M810 Age-related osteoporosis without current pathological fracture: Secondary | ICD-10-CM | POA: Diagnosis not present

## 2013-12-13 DIAGNOSIS — M0589 Other rheumatoid arthritis with rheumatoid factor of multiple sites: Secondary | ICD-10-CM | POA: Diagnosis not present

## 2013-12-15 ENCOUNTER — Ambulatory Visit: Payer: Medicare Other | Admitting: Podiatry

## 2013-12-16 DIAGNOSIS — Z0000 Encounter for general adult medical examination without abnormal findings: Secondary | ICD-10-CM | POA: Diagnosis not present

## 2013-12-16 DIAGNOSIS — Z1389 Encounter for screening for other disorder: Secondary | ICD-10-CM | POA: Diagnosis not present

## 2013-12-16 DIAGNOSIS — Z01411 Encounter for gynecological examination (general) (routine) with abnormal findings: Secondary | ICD-10-CM | POA: Diagnosis not present

## 2013-12-16 DIAGNOSIS — I10 Essential (primary) hypertension: Secondary | ICD-10-CM | POA: Diagnosis not present

## 2013-12-16 DIAGNOSIS — F329 Major depressive disorder, single episode, unspecified: Secondary | ICD-10-CM | POA: Diagnosis not present

## 2013-12-16 DIAGNOSIS — E780 Pure hypercholesterolemia: Secondary | ICD-10-CM | POA: Diagnosis not present

## 2013-12-16 DIAGNOSIS — G4733 Obstructive sleep apnea (adult) (pediatric): Secondary | ICD-10-CM | POA: Diagnosis not present

## 2013-12-16 DIAGNOSIS — Z01419 Encounter for gynecological examination (general) (routine) without abnormal findings: Secondary | ICD-10-CM | POA: Diagnosis not present

## 2013-12-16 DIAGNOSIS — M069 Rheumatoid arthritis, unspecified: Secondary | ICD-10-CM | POA: Diagnosis not present

## 2013-12-22 ENCOUNTER — Encounter: Payer: Self-pay | Admitting: Emergency Medicine

## 2013-12-22 ENCOUNTER — Ambulatory Visit (INDEPENDENT_AMBULATORY_CARE_PROVIDER_SITE_OTHER): Payer: Medicare Other | Admitting: Podiatry

## 2013-12-22 ENCOUNTER — Ambulatory Visit (INDEPENDENT_AMBULATORY_CARE_PROVIDER_SITE_OTHER): Payer: Medicare Other | Admitting: Emergency Medicine

## 2013-12-22 VITALS — BP 122/64 | HR 82 | Temp 97.1°F | Ht 62.0 in | Wt 190.0 lb

## 2013-12-22 DIAGNOSIS — B351 Tinea unguium: Secondary | ICD-10-CM | POA: Diagnosis not present

## 2013-12-22 DIAGNOSIS — J449 Chronic obstructive pulmonary disease, unspecified: Secondary | ICD-10-CM | POA: Diagnosis not present

## 2013-12-22 DIAGNOSIS — M79673 Pain in unspecified foot: Secondary | ICD-10-CM | POA: Diagnosis not present

## 2013-12-22 MED ORDER — PREDNISONE 10 MG PO TABS: ORAL_TABLET | ORAL | Status: DC

## 2013-12-22 MED ORDER — AZITHROMYCIN 250 MG PO TABS: ORAL_TABLET | ORAL | Status: DC

## 2013-12-22 NOTE — Assessment & Plan Note (Signed)
Please continue your inhaled Medication as you have been taking them.  Restart Flonase nasal spray 2 sprays each nostril daily Try using over-the-counter symptomatic relief for cold and flu Prescriptions for prednisone and azithromycin will be given. Please feel these if you experience worsening cough, more mucus production, a change in the color or mucus, or wheezing.  Follow with Dr Lamonte Sakai in 4 months or sooner if you have any problems.

## 2013-12-22 NOTE — Patient Instructions (Signed)
Please continue your inhaled Medication as you have been taking them.  Restart Flonase nasal spray 2 sprays each nostril daily Try using over-the-counter symptomatic relief for cold and flu Prescriptions for prednisone and azithromycin will be given. Please feel these if you experience worsening cough, more mucus production, a change in the color or mucus, or wheezing.  Follow with Dr Lamonte Sakai in 4 months or sooner if you have any problems.

## 2013-12-22 NOTE — Progress Notes (Signed)
2011 >> 73 yo woman, former smoker, with obesity, HTN, RA, allergic rhinitis, OSA dx by [facility] not currently on CPAP due to intolerance of high pressure. Referred for evaluation for chronic cough.   Has been seen for chronic cough by Dr Lady Deutscher for cough over the last 2 yrs. Prior to that she did not cough frequently, only with URIs. She reports persistent cough for about 2 yrs, usuallly non-prod but sometimes white to clear, appears to be worse during the winter months. Has also had PND and allergies that have remained bothersome. Was tried on zyrtec, also on Advair, QVAR at one point, not on these currently. Was treated for the last 2 months empirically for GERD with lansoprasole, off it now. She sometimes wakes at night with cough, PND. No real voice changes, some globus sensation and throat clearing.    ROV 06/02/13 --  Hx of RA on immunosuppression, OSA, mild AFL with marginal BD response, diastolic dysfxn, borderline secondary PAH. She is new ly dx with squamous cell lung CA post-resection by Dr Servando Snare. She was treated with IV abx post op for pseudomonal chest wound infxn. She has completed the abx, PICC is out. She is building up her strength, working with PT.  She is on Spiriva, albuterol prn - hasn't required. Uses zyrtec. She has noted desaturations after walking about 291ft.   ROV 08/31/13 -- follow up visit for COPD and (untreated) OSA. She is on MTX for RA, is off prednisone. Diastolic dysfxn and recently treated stage 1 squamous cell lung CA. She has some residual pleural air that may be contiguous with air outside the chest wall > being followed by [facility]. She is doing well but is understandably worried about the pneumatocele. BD's are spiriva + prn SABA.   ROV 12/22/13 -- history of COPD and (untreated) OSA. She is on MTX for RA, is off prednisone. Diastolic dysfxn and recently treated stage 1 squamous cell lung CA. Here for regular f/u visit. She has begun to have some URI type sx. Some  drainage and sore throat, non-prod cough. No N/V. May have had a sick contact from her granddaughter. She has lost wt, has otherwise been doing well. She is on spiriva + SABA.  Not using flonase right now.     EXAM:  Filed Vitals:   12/22/13 1522  BP: 122/64  Pulse: 82  Temp: 97.1 F (36.2 C)  TempSrc: Oral  Height: 5\' 2"  (1.575 m)  Weight: 190 lb (86.183 kg)  SpO2: 97%   Gen: Pleasant, obese, in no distress,  normal affect, in wheelchair   ENT: No lesions,  mouth clear,  oropharynx clear, no postnasal drip  Neck: No JVD, no TMG, no carotid bruits  Lungs: No use of accessory muscles, diminished BS in bases. Few L crackles. No palpable subcutaneous air pocket.  Along left chest wall healed surgical scars.   Cardiovascular: RRR, heart sounds normal, no murmur or gallops, tr-1+  peripheral edema  Musculoskeletal: No deformities, no cyanosis or clubbing  Neuro: alert, non focal  Skin: Warm, no lesions or rashes   06/02/13 --  COMPARISON: 05/05/2013  FINDINGS:  Postoperative loculated air in the left mid lateral chest is smaller  by chest x-ray. Otherwise stable postoperative changes of the left  lung. No edema, significant airspace consolidation or pleural  effusion identified. There is mild atelectasis at the right lung  base.  IMPRESSION:  Decrease in loculated pneumothorax of the left chest  postoperatively  08/22/13 --  COMPARISON:  PA and lateral chest of July 19, 2013. And Jun 02, 2013  FINDINGS:  There is postsurgical change in the left mid lung which appears  stable. No suspicious masses demonstrated. On the right there is  linear density in the infrahilar region which which is similar to  that demonstrated in May of 2015. The heart and pulmonary  vascularity within the limits of normal. There is no pleural  effusion. The bony thorax exhibits no acute abnormalities.  IMPRESSION:  There is a stable appearance of the chest since the previous   studies     COPD (chronic obstructive pulmonary disease) Please continue your inhaled Medication as you have been taking them.  Restart Flonase nasal spray 2 sprays each nostril daily Try using over-the-counter symptomatic relief for cold and flu Prescriptions for prednisone and azithromycin will be given. Please feel these if you experience worsening cough, more mucus production, a change in the color or mucus, or wheezing.  Follow with Dr Lamonte Sakai in 4 months or sooner if you have any problems.

## 2013-12-22 NOTE — Progress Notes (Signed)
She presents today with a chief complaint of painful elongated toenails one through 5 bilateral.  Objective: Pulses are palpable bilateral. Nails are thick yellow dystrophic with mycotic painful palpation.  Assessment: Pain in limb secondary to onychomycosis 1-5 bilateral.  Plan: Debridement of nails 1 through 5 bilateral.

## 2013-12-22 NOTE — Addendum Note (Signed)
Addended by: Len Blalock on: 12/22/2013 03:54 PM   Modules accepted: Orders

## 2014-02-02 DIAGNOSIS — K219 Gastro-esophageal reflux disease without esophagitis: Secondary | ICD-10-CM | POA: Diagnosis not present

## 2014-02-02 DIAGNOSIS — C3492 Malignant neoplasm of unspecified part of left bronchus or lung: Secondary | ICD-10-CM | POA: Diagnosis not present

## 2014-02-02 DIAGNOSIS — M069 Rheumatoid arthritis, unspecified: Secondary | ICD-10-CM | POA: Diagnosis not present

## 2014-02-02 DIAGNOSIS — G4733 Obstructive sleep apnea (adult) (pediatric): Secondary | ICD-10-CM | POA: Diagnosis not present

## 2014-02-02 DIAGNOSIS — Z86010 Personal history of colonic polyps: Secondary | ICD-10-CM | POA: Diagnosis not present

## 2014-02-13 ENCOUNTER — Encounter: Payer: Self-pay | Admitting: *Deleted

## 2014-02-13 DIAGNOSIS — Z86010 Personal history of colonic polyps: Secondary | ICD-10-CM | POA: Diagnosis not present

## 2014-02-13 NOTE — [facility] Oncology Navigator Note (Signed)
Patient at [facility] with friend.  She reports that she is doing very well.  She denies any questions or concerns at this time.  I reminded her to call me for any needs she may have.

## 2014-02-15 DIAGNOSIS — M0589 Other rheumatoid arthritis with rheumatoid factor of multiple sites: Secondary | ICD-10-CM | POA: Diagnosis not present

## 2014-03-22 ENCOUNTER — Other Ambulatory Visit: Payer: Self-pay | Admitting: *Deleted

## 2014-03-22 DIAGNOSIS — C3412 Malignant neoplasm of upper lobe, left bronchus or lung: Secondary | ICD-10-CM

## 2014-03-23 ENCOUNTER — Ambulatory Visit: Payer: Medicare Other

## 2014-03-29 DIAGNOSIS — H903 Sensorineural hearing loss, bilateral: Secondary | ICD-10-CM | POA: Diagnosis not present

## 2014-03-30 ENCOUNTER — Ambulatory Visit (INDEPENDENT_AMBULATORY_CARE_PROVIDER_SITE_OTHER): Payer: Medicare Other | Admitting: Podiatry

## 2014-03-30 DIAGNOSIS — M79673 Pain in unspecified foot: Secondary | ICD-10-CM

## 2014-03-30 DIAGNOSIS — B351 Tinea unguium: Secondary | ICD-10-CM | POA: Diagnosis not present

## 2014-03-30 NOTE — Progress Notes (Signed)
She presents today with a chief complaint of painful elongated toenails one through 5 bilateral.  Objective: Pulses are palpable bilateral. Nails are thick yellow dystrophic with mycotic painful palpation.  Assessment: Pain in limb secondary to onychomycosis 1-5 bilateral.  Plan: Debridement of nails 1 through 5 bilateral.

## 2014-04-10 DIAGNOSIS — M0589 Other rheumatoid arthritis with rheumatoid factor of multiple sites: Secondary | ICD-10-CM | POA: Diagnosis not present

## 2014-04-10 DIAGNOSIS — M150 Primary generalized (osteo)arthritis: Secondary | ICD-10-CM | POA: Diagnosis not present

## 2014-04-10 DIAGNOSIS — M810 Age-related osteoporosis without current pathological fracture: Secondary | ICD-10-CM | POA: Diagnosis not present

## 2014-04-13 ENCOUNTER — Ambulatory Visit
Admission: RE | Admit: 2014-04-13 | Discharge: 2014-04-13 | Disposition: A | Discharge status: Home / Self Care | Payer: Medicare Other | Source: Ambulatory Visit | Attending: Cardiothoracic Surgery | Admitting: Cardiothoracic Surgery

## 2014-04-13 ENCOUNTER — Ambulatory Visit (INDEPENDENT_AMBULATORY_CARE_PROVIDER_SITE_OTHER): Payer: Medicare Other | Admitting: Cardiothoracic Surgery

## 2014-04-13 ENCOUNTER — Encounter: Payer: Self-pay | Admitting: Cardiothoracic Surgery

## 2014-04-13 VITALS — BP 128/76 | HR 82 | Resp 20 | Wt 166.0 lb

## 2014-04-13 DIAGNOSIS — C3412 Malignant neoplasm of upper lobe, left bronchus or lung: Secondary | ICD-10-CM

## 2014-04-13 DIAGNOSIS — Z85118 Personal history of other malignant neoplasm of bronchus and lung: Secondary | ICD-10-CM

## 2014-04-13 DIAGNOSIS — M6281 Muscle weakness (generalized): Secondary | ICD-10-CM | POA: Diagnosis not present

## 2014-04-13 DIAGNOSIS — Z8541 Personal history of malignant neoplasm of cervix uteri: Secondary | ICD-10-CM | POA: Diagnosis not present

## 2014-04-13 DIAGNOSIS — J479 Bronchiectasis, uncomplicated: Secondary | ICD-10-CM | POA: Diagnosis not present

## 2014-04-13 DIAGNOSIS — M25532 Pain in left wrist: Secondary | ICD-10-CM | POA: Diagnosis not present

## 2014-04-13 DIAGNOSIS — M25561 Pain in right knee: Secondary | ICD-10-CM | POA: Diagnosis not present

## 2014-04-13 DIAGNOSIS — J449 Chronic obstructive pulmonary disease, unspecified: Secondary | ICD-10-CM

## 2014-04-13 DIAGNOSIS — J984 Other disorders of lung: Secondary | ICD-10-CM

## 2014-04-13 DIAGNOSIS — J9809 Other diseases of bronchus, not elsewhere classified: Secondary | ICD-10-CM | POA: Diagnosis not present

## 2014-04-13 DIAGNOSIS — M545 Low back pain: Secondary | ICD-10-CM | POA: Diagnosis not present

## 2014-04-13 NOTE — Progress Notes (Signed)
[place]       ,[place] [id number]             [phone]      Taylor Berry Date of Birth: Jul 04, 1940  Referring: Collene Gobble, MD Primary Care: Osborne Casco, MD  Chief Complaint:   POST OP FOLLOW UP 04/12/2013  PREOPERATIVE DIAGNOSIS: Squamous cell carcinoma of the left upper lobe  with limited pulmonary reserve.  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  SURGICAL PROCEDURE: Video bronchoscopy, left video-assisted  thoracoscopy, mini-thoracotomy, wedge resection of left upper lobe with  lymph node dissection, and biopsy of chest wall.  SURGEON: Lanelle Bal, MD  Lung cancer, left upper lobe- Squamous cell    Primary site: Lung (Left)   Staging method: AJCC 7th Edition   Pathologic: Stage IB (T2a, N0, cM0) signed by [name], MD on 04/14/2013  8:31 AM   Summary: Stage IB (T2a, N0, cM0)  History of Present Illness:     Patient returns to the office today with followup CT scan 6 months following  wedge resection of left upper lobe for squamous cell carcinoma stage IB on 04/12/2013.  During her hospital course she developed Klebsiella in her sputum, Escherichia coli in her urine, Pseudomonas at the left chest incision site. She was initially discharged home to SNF .She comes to the office today now exactly 1 year postop. She notes she's no longer using her inhalers her breathlessness has markedly improved. She attributes this to more than 50 pounds weight loss over the past year which is been purposeful.  She remains off oxygen She's had no hemoptysis.    Zubrod Score: At the time of surgery this patient's most appropriate activity status/level should be described as: '[]'$     0    Normal activity, no symptoms '[x]'$     1    Restricted in physical strenuous activity but ambulatory, able to do out light work '[]'$     2    Ambulatory  and capable of self care, unable to do work activities, up and about >50 % of waking hours                              '[]'$     3    Only limited self care, in bed greater than 50% of waking hours '[]'$     4    Completely disabled, no self care, confined to bed or chair '[]'$     5    Moribund    Past Medical History  Diagnosis Date  . Hypertension   . Rheumatoid arthritis(714.0)   . Osteoporosis   . Allergic rhinitis   . Hyperlipidemia   . Obesity   . OSA (obstructive sleep apnea)   . Osteoarthritis   . Uterine cancer   . COPD (chronic obstructive pulmonary disease)     cervical cancer  . Shortness of breath   . Cough with expectoration     coughs up phlegm early morning  . GERD (gastroesophageal reflux disease)   . Left bundle branch block      History  Smoking status  . Former Smoker -- 1.00 packs/day for 35 years  . Types: Cigarettes  . Quit date: 01/07/1988  Smokeless tobacco  . Never Used    History  Alcohol Use No    Comment: not  since 1988     Allergies  Allergen Reactions  . Latex     REACTION: itching  . Ace Inhibitors     REACTION: cough  . Hydrochlorothiazide     REACTION: decreased sodium  . Penicillins     REACTION: intolerant due to yeast infection    Current Outpatient Prescriptions  Medication Sig Dispense Refill  . albuterol (VENTOLIN HFA) 108 (90 BASE) MCG/ACT inhaler Inhale 2 puffs into the lungs every 6 (six) hours as needed for wheezing or shortness of breath.     Marland Kitchen aspirin EC 81 MG tablet Take 81 mg by mouth daily.    . calcium-vitamin D (OSCAL WITH D) 500-200 MG-UNIT per tablet Take 1 tablet by mouth every other day.    . cholecalciferol (VITAMIN D) 1000 UNITS tablet Take 1,000 Units by mouth daily.    . famotidine (PEPCID) 20 MG tablet Take 20 mg by mouth at bedtime.     . fluticasone (FLONASE) 50 MCG/ACT nasal spray PLACE 2 SPRAYS INTO THE NOSE TWICE DAILY AS NEEDED. 16 g 1  . folic acid (FOLVITE) Q000111Q MCG tablet Take 800 mcg by mouth daily.     . methotrexate (RHEUMATREX) 2.5 MG tablet Take 2.5 mg by mouth once a week. 6 tablet once a week  2  . Mouthwashes (BIOTENE/CALCIUM MT) Use as directed in the mouth or throat daily.    Marland Kitchen olmesartan (BENICAR) 5 MG tablet Take by mouth daily.    . simvastatin (ZOCOR) 20 MG tablet Take 20 mg by mouth at bedtime.      Marland Kitchen venlafaxine (EFFEXOR-XR) 75 MG 24 hr capsule Take 75 mg by mouth daily.       No current facility-administered medications for this visit.       Physical Exam: BP 128/76 mmHg  Pulse 82  Resp 20  Wt 166 lb (75.297 kg)  SpO2 94%  General appearance: alert, cooperative and appears older than stated age Neurologic: intact Heart: regular rate and rhythm, S1, S2 normal, no murmur, click, rub or gallop Lungs: diminished breath sounds bibasilar Abdomen: soft, non-tender; bowel sounds normal; no masses,  no organomegaly Extremities: edema Mild ankle edema Wound: Her left chest port sites and incision are well-healed without evidence of drainage erythema or infection On palpation of the left chest wall there is no crepitance noted  Diagnostic Studies & Laboratory data:     Recent Radiology Findings:  Ct Chest Wo Contrast  04/13/2014   CLINICAL DATA:  74 year old female with left upper lobe lung cancer status post resection 6 months ago. Tenderness at the surgical site.  EXAM: CT CHEST WITHOUT CONTRAST  TECHNIQUE: Multidetector CT imaging of the chest was performed following the standard protocol without IV contrast.  COMPARISON:  Chest CT 10/06/2013.  FINDINGS: Mediastinum/Lymph Nodes: Heart size is normal. There is no significant pericardial fluid, thickening or pericardial calcification. There is atherosclerosis of the thoracic aorta, the great vessels of the mediastinum and the coronary arteries, including calcified atherosclerotic plaque in the left main, left anterior descending and right coronary arteries. No pathologically enlarged mediastinal or hilar lymph nodes. Please  note that accurate exclusion of hilar adenopathy is limited on noncontrast CT scans. Esophagus is unremarkable in appearance. No axillary lymphadenopathy.  Lungs/Pleura: Postoperative changes of wedge resection are again noted in the lateral aspect of the left upper lobe. Again noted is a large peripheral postoperative pneumatocele in this region, which has decreased significantly in size compared to the prior study. A few scattered 2-3  mm micronodules are noted throughout the lungs bilaterally, peribronchovascular in distribution, favored to represent areas of chronic mucoid impaction within terminal bronchioles, similar to prior studies. No larger more suspicious appearing pulmonary nodules or masses are noted. Mild diffuse bronchial wall thickening, with some scattered areas of cylindrical and mild varicose bronchiectasis, most evident in the lower lobes of the lungs bilaterally. No acute consolidative airspace disease. No pleural effusions. There continues to be a small amount of herniation have the left lower lobe through the fifth intercostal space laterally, at the site of thoracotomy. Trace amount of residual gas in the left serratus musculature, significantly decreased compared to the prior examination.  Upper Abdomen: Atherosclerosis.  Musculoskeletal/Soft Tissues: Postthoracotomy changes in the left ribs. There are no aggressive appearing lytic or blastic lesions noted in the visualized portions of the skeleton.  IMPRESSION: 1. Status post left upper lobe wedge resection, with no findings to suggest local recurrence of disease. Decreasing size of postoperative the mass steel in the left upper lobe, and decreasing herniation through the left fifth intercostal space, as discussed above. 2. Diffuse bronchial wall thickening with areas of cylindrical and mild varicose bronchiectasis, most evident in the lower lobes of the lungs bilaterally. These findings are similar to recent prior examinations. 3.  Atherosclerosis, including left main and 2 vessel coronary artery disease. Assessment for potential risk factor modification, dietary therapy or pharmacologic therapy may be warranted, if clinically indicated. 4. Additional incidental findings, as above.   Electronically Signed   By: Vinnie Langton M.D.   On: 04/13/2014 13:24   Dg Chest 2 View  10/06/2013   CLINICAL DATA:  History of squamous cell carcinoma of the left upper lobe. Status post wedge resection of the left upper lobe with lymph node dissection and biopsy of the chest wall 04/12/2013.  EXAM: CHEST  2 VIEW  COMPARISON:  PA and lateral chest 08/22/2013, 07/19/2013 and 06/02/2013. CT chest 02/01/2013.  FINDINGS: Cystic lesion in the left upper lobe consistent with history of tumor section is unchanged. A very small amount of gas is seen along the left chest wall and the left breast, decreased since the prior exams. Pulmonary interstitium is somewhat coarsened but unchanged. Heart size is normal. No pneumothorax is identified. Left rib deformities are again seen.  IMPRESSION: No acute abnormality.  Postoperative change left upper lobe. Small volume of subcutaneous gas in the left chest wall and left breast has decreased. No new abnormality since the prior examinations.   Electronically Signed   By: Inge Rise M.D.   On: 10/06/2013 11:54   Ct Chest Wo Contrast  10/06/2013   CLINICAL DATA:  Followup left lung carcinoma. 25month status post surgical resection.  EXAM: CT CHEST WITHOUT CONTRAST  TECHNIQUE: Multidetector CT imaging of the chest was performed following the standard protocol without IV contrast.  COMPARISON:  02/01/2013  FINDINGS: Mediastinum/Hilar Regions: No masses or pathologically enlarged lymph nodes identified.  Other Thoracic Lymphadenopathy:  None.  Lungs: Postop changes are noted in the posterior left upper lobe. A peripheral pneumatocele is seen which shows intercostal herniation at the surgical site. No pneumothorax  identified. No suspicious pulmonary nodules or masses are identified. Mild bilateral lower lobe bronchiectasis noted, left side greater than right.  Pleura:  No evidence of effusion or mass.  Vascular/Cardiac: No thoracic aortic aneurysm or other significant abnormality identified.  Musculoskeletal:  No suspicious bone lesions identified.  Other:  None.  IMPRESSION: Postop changes in left upper lobe, with incidentally noted peripheral pneumatocele showing  small intercostal herniation at the thoracotomy site.  No evidence of residual, recurrent, or metastatic carcinoma in the thorax.   Electronically Signed   By: Earle Gell M.D.   On: 10/06/2013 13:01    Dg Chest 2 View  07/19/2013   CLINICAL DATA:  Pain and suspected gas in the region of lateral left chest wall incision  EXAM: CHEST  2 VIEW  COMPARISON:  PA and lateral chest x-ray of Jun 02, 2013  FINDINGS: There is air in the subcutaneous tissues of the left axillary region. Adjacent to this there is a focal stable appearing pleural space air collection at the site of the previous wedge resection. Suture material is noted in the medial aspect of this gas collection. There is no mediastinal shift. There is no pleural effusion. No acute abnormality elsewhere on the left nor of the right lung is demonstrated. The heart and pulmonary vascularity are normal. Are ununited fractures associated with the fifth and sixth ribs laterally and a healing fracture of the eighth rib.  IMPRESSION: 1. There is subcutaneous gas which may communicate with a stable appearing pleural based gas collection at the site of previous surgery in the left mid thorax laterally. 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 There are unhealed fractures of the adjacent left fifth,sixth, and eighth ribs laterally. 2. The lungs elsewhere are clear and exhibit emphysematous change. There is no evidence of CHF. There is no pleural effusion. 3. Chest CT scanning with or without contrast may be useful. 4.  These results will be called to the ordering clinician or representative by the Radiologist Assistant, and communication documented in the PACS or zVision Dashboard.   Electronically Signed   By: David  Martinique   On: 07/19/2013 11:35    Recent Lab Findings: Lab Results  Component Value Date   WBC 10.5 04/27/2013   HGB 9.4* 04/27/2013   HCT 29.3* 04/27/2013   PLT 274 04/27/2013   GLUCOSE 117* 04/27/2013   ALT 7 04/14/2013   AST 19 04/14/2013   NA 135* 04/27/2013   K 4.5 04/27/2013   CL 98 04/27/2013   CREATININE 0.49* 04/27/2013   BUN 11 04/27/2013   CO2 24 04/27/2013   INR 0.91 04/11/2013   Wt Readings from Last 3 Encounters:  04/13/14 166 lb (75.297 kg)  12/22/13 190 lb (86.183 kg)  10/06/13 204 lb (92.534 kg)     Assessment / Plan:   Patient is now exactly 12 months months   following wedge resection of stage IB squamous cell carcinoma of the left upper lobe without evidence of recurrence on CT scan  She's recovering from a prolonged postoperative course related to her severe underlying lung disease and rheumatoid arthritis.  Overall her COPD / breathlessness has markedly improved with significant weight loss. There is a pneumatocele with small intercostal herniation which has decreased in size  Plan to see back in 6 months with a followup CT which will be approximately 18 months postop  She notes she's up-to-date on pneumococcal vaccination, he is encouraged to get her flu shot soon.  [name] MD      [address] ,[place] [address] Office [phone]   Beeper [id number]  04/13/2014 2:39 PM

## 2014-04-18 DIAGNOSIS — M6281 Muscle weakness (generalized): Secondary | ICD-10-CM | POA: Diagnosis not present

## 2014-04-18 DIAGNOSIS — M25561 Pain in right knee: Secondary | ICD-10-CM | POA: Diagnosis not present

## 2014-04-18 DIAGNOSIS — M25532 Pain in left wrist: Secondary | ICD-10-CM | POA: Diagnosis not present

## 2014-04-18 DIAGNOSIS — M545 Low back pain: Secondary | ICD-10-CM | POA: Diagnosis not present

## 2014-04-19 DIAGNOSIS — M545 Low back pain: Secondary | ICD-10-CM | POA: Diagnosis not present

## 2014-04-19 DIAGNOSIS — M25561 Pain in right knee: Secondary | ICD-10-CM | POA: Diagnosis not present

## 2014-04-19 DIAGNOSIS — M25532 Pain in left wrist: Secondary | ICD-10-CM | POA: Diagnosis not present

## 2014-04-19 DIAGNOSIS — M6281 Muscle weakness (generalized): Secondary | ICD-10-CM | POA: Diagnosis not present

## 2014-04-21 DIAGNOSIS — M25561 Pain in right knee: Secondary | ICD-10-CM | POA: Diagnosis not present

## 2014-04-21 DIAGNOSIS — M6281 Muscle weakness (generalized): Secondary | ICD-10-CM | POA: Diagnosis not present

## 2014-04-21 DIAGNOSIS — M545 Low back pain: Secondary | ICD-10-CM | POA: Diagnosis not present

## 2014-04-21 DIAGNOSIS — M25532 Pain in left wrist: Secondary | ICD-10-CM | POA: Diagnosis not present

## 2014-04-24 DIAGNOSIS — M545 Low back pain: Secondary | ICD-10-CM | POA: Diagnosis not present

## 2014-04-24 DIAGNOSIS — M6281 Muscle weakness (generalized): Secondary | ICD-10-CM | POA: Diagnosis not present

## 2014-04-24 DIAGNOSIS — M25561 Pain in right knee: Secondary | ICD-10-CM | POA: Diagnosis not present

## 2014-04-24 DIAGNOSIS — M25532 Pain in left wrist: Secondary | ICD-10-CM | POA: Diagnosis not present

## 2014-04-26 DIAGNOSIS — M6281 Muscle weakness (generalized): Secondary | ICD-10-CM | POA: Diagnosis not present

## 2014-04-26 DIAGNOSIS — M25532 Pain in left wrist: Secondary | ICD-10-CM | POA: Diagnosis not present

## 2014-04-26 DIAGNOSIS — M25561 Pain in right knee: Secondary | ICD-10-CM | POA: Diagnosis not present

## 2014-04-26 DIAGNOSIS — M545 Low back pain: Secondary | ICD-10-CM | POA: Diagnosis not present

## 2014-04-28 DIAGNOSIS — M25561 Pain in right knee: Secondary | ICD-10-CM | POA: Diagnosis not present

## 2014-04-28 DIAGNOSIS — M25532 Pain in left wrist: Secondary | ICD-10-CM | POA: Diagnosis not present

## 2014-04-28 DIAGNOSIS — M6281 Muscle weakness (generalized): Secondary | ICD-10-CM | POA: Diagnosis not present

## 2014-04-28 DIAGNOSIS — M545 Low back pain: Secondary | ICD-10-CM | POA: Diagnosis not present

## 2014-05-01 DIAGNOSIS — M545 Low back pain: Secondary | ICD-10-CM | POA: Diagnosis not present

## 2014-05-01 DIAGNOSIS — M25561 Pain in right knee: Secondary | ICD-10-CM | POA: Diagnosis not present

## 2014-05-01 DIAGNOSIS — M25532 Pain in left wrist: Secondary | ICD-10-CM | POA: Diagnosis not present

## 2014-05-01 DIAGNOSIS — M6281 Muscle weakness (generalized): Secondary | ICD-10-CM | POA: Diagnosis not present

## 2014-05-03 DIAGNOSIS — M545 Low back pain: Secondary | ICD-10-CM | POA: Diagnosis not present

## 2014-05-03 DIAGNOSIS — M6281 Muscle weakness (generalized): Secondary | ICD-10-CM | POA: Diagnosis not present

## 2014-05-03 DIAGNOSIS — M25561 Pain in right knee: Secondary | ICD-10-CM | POA: Diagnosis not present

## 2014-05-03 DIAGNOSIS — M25532 Pain in left wrist: Secondary | ICD-10-CM | POA: Diagnosis not present

## 2014-05-05 DIAGNOSIS — M545 Low back pain: Secondary | ICD-10-CM | POA: Diagnosis not present

## 2014-05-05 DIAGNOSIS — M25532 Pain in left wrist: Secondary | ICD-10-CM | POA: Diagnosis not present

## 2014-05-05 DIAGNOSIS — M6281 Muscle weakness (generalized): Secondary | ICD-10-CM | POA: Diagnosis not present

## 2014-05-05 DIAGNOSIS — M25561 Pain in right knee: Secondary | ICD-10-CM | POA: Diagnosis not present

## 2014-05-08 DIAGNOSIS — M25561 Pain in right knee: Secondary | ICD-10-CM | POA: Diagnosis not present

## 2014-05-08 DIAGNOSIS — M545 Low back pain: Secondary | ICD-10-CM | POA: Diagnosis not present

## 2014-05-08 DIAGNOSIS — M25532 Pain in left wrist: Secondary | ICD-10-CM | POA: Diagnosis not present

## 2014-05-08 DIAGNOSIS — M6281 Muscle weakness (generalized): Secondary | ICD-10-CM | POA: Diagnosis not present

## 2014-05-10 DIAGNOSIS — M6281 Muscle weakness (generalized): Secondary | ICD-10-CM | POA: Diagnosis not present

## 2014-05-10 DIAGNOSIS — M25532 Pain in left wrist: Secondary | ICD-10-CM | POA: Diagnosis not present

## 2014-05-10 DIAGNOSIS — M25561 Pain in right knee: Secondary | ICD-10-CM | POA: Diagnosis not present

## 2014-05-10 DIAGNOSIS — M545 Low back pain: Secondary | ICD-10-CM | POA: Diagnosis not present

## 2014-05-12 DIAGNOSIS — M545 Low back pain: Secondary | ICD-10-CM | POA: Diagnosis not present

## 2014-05-12 DIAGNOSIS — M6281 Muscle weakness (generalized): Secondary | ICD-10-CM | POA: Diagnosis not present

## 2014-05-12 DIAGNOSIS — M25561 Pain in right knee: Secondary | ICD-10-CM | POA: Diagnosis not present

## 2014-05-12 DIAGNOSIS — M25532 Pain in left wrist: Secondary | ICD-10-CM | POA: Diagnosis not present

## 2014-05-15 DIAGNOSIS — M25561 Pain in right knee: Secondary | ICD-10-CM | POA: Diagnosis not present

## 2014-05-15 DIAGNOSIS — M25532 Pain in left wrist: Secondary | ICD-10-CM | POA: Diagnosis not present

## 2014-05-15 DIAGNOSIS — M6281 Muscle weakness (generalized): Secondary | ICD-10-CM | POA: Diagnosis not present

## 2014-05-15 DIAGNOSIS — M545 Low back pain: Secondary | ICD-10-CM | POA: Diagnosis not present

## 2014-05-16 ENCOUNTER — Ambulatory Visit (INDEPENDENT_AMBULATORY_CARE_PROVIDER_SITE_OTHER): Payer: Medicare Other | Admitting: Emergency Medicine

## 2014-05-16 ENCOUNTER — Encounter: Payer: Self-pay | Admitting: Emergency Medicine

## 2014-05-16 VITALS — BP 120/80 | HR 76 | Ht 62.0 in | Wt 173.0 lb

## 2014-05-16 DIAGNOSIS — G4733 Obstructive sleep apnea (adult) (pediatric): Secondary | ICD-10-CM

## 2014-05-16 DIAGNOSIS — J449 Chronic obstructive pulmonary disease, unspecified: Secondary | ICD-10-CM | POA: Diagnosis not present

## 2014-05-16 DIAGNOSIS — C3412 Malignant neoplasm of upper lobe, left bronchus or lung: Secondary | ICD-10-CM

## 2014-05-16 NOTE — Assessment & Plan Note (Signed)
No evidence of recurrence on her most recent CT scan. She is following with Dr. Servando Snare. We will continue her surveillance

## 2014-05-16 NOTE — Progress Notes (Signed)
2011 >> 74 yo woman, former smoker, with obesity, HTN, RA, allergic rhinitis, OSA dx by PSG not currently on CPAP due to intolerance of high pressure. Referred for evaluation for chronic cough.   Has been seen for chronic cough by Dr Lady Deutscher for cough over the last 2 yrs. Prior to that she did not cough frequently, only with URIs. She reports persistent cough for about 2 yrs, usuallly non-prod but sometimes white to clear, appears to be worse during the winter months. Has also had PND and allergies that have remained bothersome. Was tried on zyrtec, also on Advair, QVAR at one point, not on these currently. Was treated for the last 2 months empirically for GERD with lansoprasole, off it now. She sometimes wakes at night with cough, PND. No real voice changes, some globus sensation and throat clearing.    ROV 06/02/13 --  Hx of RA on immunosuppression, OSA, mild AFL with marginal BD response, diastolic dysfxn, borderline secondary PAH. She is new ly dx with squamous cell lung CA post-resection by Dr Servando Snare. She was treated with IV abx post op for pseudomonal chest wound infxn. She has completed the abx, PICC is out. She is building up her strength, working with PT.  She is on Spiriva, albuterol prn - hasn't required. Uses zyrtec. She has noted desaturations after walking about 217f.   ROV 08/31/13 -- follow up visit for COPD and (untreated) OSA. She is on MTX for RA, is off prednisone. Diastolic dysfxn and recently treated stage 1 squamous cell lung CA. She has some residual pleural air that may be contiguous with air outside the chest wall > being followed by [facility]. She is doing well but is understandably worried about the pneumatocele. BD's are spiriva + prn SABA.   ROV 12/22/13 -- history of COPD and (untreated) OSA. She is on MTX for RA, is off prednisone. Diastolic dysfxn and recently treated stage 1 squamous cell lung CA. Here for regular f/u visit. She has begun to have some URI type sx. Some  drainage and sore throat, non-prod cough. No N/V. May have had a sick contact from her granddaughter. She has lost wt, has otherwise been doing well. She is on spiriva + SABA.  Not using flonase right now.   ROV 05/16/14 -- follow-up visit for COPD, untreated OSA, rheumatoid arthritis on methotrexate. She is also been treated for stage I squamous cell lung cancer. She has been working hard on diet and exercise > has lost about 20 lbs since last time. Her breathing is better, has much less cough. Unclear whether she still has OSA. Her last Ct scan 04/13/14 did not show any evidence recurrence. She does have some bronchial wall inflammation and bronchiectasis. She is off all of her scheduled BD's > does not believe she misses them !!    EXAM:  Filed Vitals:   05/16/14 1114  BP: 120/80  Pulse: 76  Height: '5\' 2"'$  (1.575 m)  Weight: 173 lb (78.472 kg)  SpO2: 94%   Gen: Pleasant, obese, in no distress,  normal affect, in wheelchair   ENT: No lesions,  mouth clear,  oropharynx clear, no postnasal drip  Neck: No JVD, no TMG, no carotid bruits  Lungs: No use of accessory muscles, diminished BS in bases. Few L crackles. No palpable subcutaneous air pocket.  Along left chest wall healed surgical scars.   Cardiovascular: RRR, heart sounds normal, no murmur or gallops, tr-1+  peripheral edema  Musculoskeletal: No deformities, no cyanosis or  clubbing  Neuro: alert, non focal  Skin: Warm, no lesions or rashes   04/13/14 --  COMPARISON: Chest CT 10/06/2013.  FINDINGS: Mediastinum/Lymph Nodes: Heart size is normal. There is no significant pericardial fluid, thickening or pericardial calcification. There is atherosclerosis of the thoracic aorta, the great vessels of the mediastinum and the coronary arteries, including calcified atherosclerotic plaque in the left main, left anterior descending and right coronary arteries. No pathologically enlarged mediastinal or hilar lymph nodes. Please note  that accurate exclusion of hilar adenopathy is limited on noncontrast CT scans. Esophagus is unremarkable in appearance. No axillary lymphadenopathy.  Lungs/Pleura: Postoperative changes of wedge resection are again noted in the lateral aspect of the left upper lobe. Again noted is a large peripheral postoperative pneumatocele in this region, which has decreased significantly in size compared to the prior study. A few scattered 2-3 mm micronodules are noted throughout the lungs bilaterally, peribronchovascular in distribution, favored to represent areas of chronic mucoid impaction within terminal bronchioles, similar to prior studies. No larger more suspicious appearing pulmonary nodules or masses are noted. Mild diffuse bronchial wall thickening, with some scattered areas of cylindrical and mild varicose bronchiectasis, most evident in the lower lobes of the lungs bilaterally. No acute consolidative airspace disease. No pleural effusions. There continues to be a small amount of herniation have the left lower lobe through the fifth intercostal space laterally, at the site of thoracotomy. Trace amount of residual gas in the left serratus musculature, significantly decreased compared to the prior examination.  Upper Abdomen: Atherosclerosis.  Musculoskeletal/Soft Tissues: Postthoracotomy changes in the left ribs. There are no aggressive appearing lytic or blastic lesions noted in the visualized portions of the skeleton.  IMPRESSION: 1. Status post left upper lobe wedge resection, with no findings to suggest local recurrence of disease. Decreasing size of postoperative the mass steel in the left upper lobe, and decreasing herniation through the left fifth intercostal space, as discussed above. 2. Diffuse bronchial wall thickening with areas of cylindrical and mild varicose bronchiectasis, most evident in the lower lobes of the lungs bilaterally. These findings are similar to  recent prior examinations. 3. Atherosclerosis, including left main and 2 vessel coronary artery disease. Assessment for potential risk factor modification, dietary therapy or pharmacologic therapy may be warranted, if clinically indicated. 4. Additional incidental findings, as above.     OSA (obstructive sleep apnea) May be improved or even resolved w wt loss. We can consider a repeat PSG at some point.    COPD (chronic obstructive pulmonary disease) Suspect  that this is mild. Her dyspnea and cough have greatly improved with weight loss. She stopped her schedule bronchodilators and is tolerated this well. She does need a refill of her albuterol.   Lung cancer, left upper lobe No evidence of recurrence on her most recent CT scan. She is following with Dr. Servando Snare. We will continue her surveillance

## 2014-05-16 NOTE — Patient Instructions (Signed)
Congratulations with your wt loss and exercise routine!! We do not need to restart scheduled inhaled medications at this time.  We will reorder your albuterol to use as needed Follow with Dr Servando Snare and get your CT scans as planned.  Follow with Dr Lamonte Sakai in 12 months or sooner if you have any problems

## 2014-05-16 NOTE — Assessment & Plan Note (Signed)
Suspect  that this is mild. Her dyspnea and cough have greatly improved with weight loss. She stopped her schedule bronchodilators and is tolerated this well. She does need a refill of her albuterol.

## 2014-05-16 NOTE — Assessment & Plan Note (Signed)
May be improved or even resolved w wt loss. We can consider a repeat PSG at some point.

## 2014-05-17 DIAGNOSIS — M6281 Muscle weakness (generalized): Secondary | ICD-10-CM | POA: Diagnosis not present

## 2014-05-17 DIAGNOSIS — M25561 Pain in right knee: Secondary | ICD-10-CM | POA: Diagnosis not present

## 2014-05-17 DIAGNOSIS — M545 Low back pain: Secondary | ICD-10-CM | POA: Diagnosis not present

## 2014-05-17 DIAGNOSIS — M25532 Pain in left wrist: Secondary | ICD-10-CM | POA: Diagnosis not present

## 2014-05-19 DIAGNOSIS — M545 Low back pain: Secondary | ICD-10-CM | POA: Diagnosis not present

## 2014-05-19 DIAGNOSIS — M25561 Pain in right knee: Secondary | ICD-10-CM | POA: Diagnosis not present

## 2014-05-19 DIAGNOSIS — M6281 Muscle weakness (generalized): Secondary | ICD-10-CM | POA: Diagnosis not present

## 2014-05-19 DIAGNOSIS — M25532 Pain in left wrist: Secondary | ICD-10-CM | POA: Diagnosis not present

## 2014-05-22 DIAGNOSIS — M25532 Pain in left wrist: Secondary | ICD-10-CM | POA: Diagnosis not present

## 2014-05-22 DIAGNOSIS — M545 Low back pain: Secondary | ICD-10-CM | POA: Diagnosis not present

## 2014-05-22 DIAGNOSIS — M6281 Muscle weakness (generalized): Secondary | ICD-10-CM | POA: Diagnosis not present

## 2014-05-22 DIAGNOSIS — M25561 Pain in right knee: Secondary | ICD-10-CM | POA: Diagnosis not present

## 2014-06-22 DIAGNOSIS — G4733 Obstructive sleep apnea (adult) (pediatric): Secondary | ICD-10-CM | POA: Diagnosis not present

## 2014-06-22 DIAGNOSIS — C3492 Malignant neoplasm of unspecified part of left bronchus or lung: Secondary | ICD-10-CM | POA: Diagnosis not present

## 2014-06-22 DIAGNOSIS — I272 Other secondary pulmonary hypertension: Secondary | ICD-10-CM | POA: Diagnosis not present

## 2014-06-22 DIAGNOSIS — F39 Unspecified mood [affective] disorder: Secondary | ICD-10-CM | POA: Diagnosis not present

## 2014-06-22 DIAGNOSIS — E780 Pure hypercholesterolemia: Secondary | ICD-10-CM | POA: Diagnosis not present

## 2014-06-22 DIAGNOSIS — I10 Essential (primary) hypertension: Secondary | ICD-10-CM | POA: Diagnosis not present

## 2014-07-06 ENCOUNTER — Ambulatory Visit (INDEPENDENT_AMBULATORY_CARE_PROVIDER_SITE_OTHER): Payer: Medicare Other | Admitting: Podiatry

## 2014-07-06 ENCOUNTER — Encounter: Payer: Self-pay | Admitting: Podiatry

## 2014-07-06 DIAGNOSIS — M79676 Pain in unspecified toe(s): Secondary | ICD-10-CM | POA: Diagnosis not present

## 2014-07-06 DIAGNOSIS — B351 Tinea unguium: Secondary | ICD-10-CM

## 2014-07-06 NOTE — Progress Notes (Signed)
Patient ID: Taylor Berry, female   DOB: 18-May-1940, 74 y.o.   MRN: [id number] Complaint:  Visit Type: Patient returns to my office for continued preventative foot care services. Complaint: Patient states" my nails have grown long and thick and become painful to walk and wear shoes".She presents for preventative foot care services. No changes to ROS  Podiatric Exam: Vascular: dorsalis pedis and posterior tibial pulses are palpable bilateral. Capillary return is immediate. Temperature gradient is WNL. Skin turgor WNL  Sensorium: Normal Semmes Weinstein monofilament test. Normal tactile sensation bilaterally. Nail Exam: Pt has thick disfigured discolored nails with subungual debris noted bilateral entire nail hallux through fifth toenails Ulcer Exam: There is no evidence of ulcer or pre-ulcerative changes or infection. Orthopedic Exam: Muscle tone and strength are WNL. No limitations in general ROM. No crepitus or effusions noted. Foot type and digits show no abnormalities. Bony prominences are unremarkable. Skin: No Porokeratosis. No infection or ulcers  Diagnosis:  Tinea unguium, Pain in right toe, pain in left toes  Treatment & Plan Procedures and Treatment: Consent by patient was obtained for treatment procedures. The patient understood the discussion of treatment and procedures well. All questions were answered thoroughly reviewed. Debridement of mycotic and hypertrophic toenails, 1 through 5 bilateral and clearing of subungual debris. No ulceration, no infection noted.  Return Visit-Office Procedure: Patient instructed to return to the office for a follow up visit 4 months for continued evaluation and treatment.

## 2014-07-11 DIAGNOSIS — M150 Primary generalized (osteo)arthritis: Secondary | ICD-10-CM | POA: Diagnosis not present

## 2014-07-11 DIAGNOSIS — M810 Age-related osteoporosis without current pathological fracture: Secondary | ICD-10-CM | POA: Diagnosis not present

## 2014-07-11 DIAGNOSIS — M0589 Other rheumatoid arthritis with rheumatoid factor of multiple sites: Secondary | ICD-10-CM | POA: Diagnosis not present

## 2014-08-29 ENCOUNTER — Encounter (INDEPENDENT_AMBULATORY_CARE_PROVIDER_SITE_OTHER): Payer: Medicare Other | Admitting: Podiatry

## 2014-08-29 ENCOUNTER — Encounter: Payer: Self-pay | Admitting: Podiatry

## 2014-08-29 DIAGNOSIS — B351 Tinea unguium: Secondary | ICD-10-CM

## 2014-08-29 DIAGNOSIS — M79673 Pain in unspecified foot: Secondary | ICD-10-CM

## 2014-08-29 NOTE — Progress Notes (Signed)
This encounter was created in error - please disregard.

## 2014-08-30 DIAGNOSIS — M0589 Other rheumatoid arthritis with rheumatoid factor of multiple sites: Secondary | ICD-10-CM | POA: Diagnosis not present

## 2014-08-30 DIAGNOSIS — M150 Primary generalized (osteo)arthritis: Secondary | ICD-10-CM | POA: Diagnosis not present

## 2014-08-30 DIAGNOSIS — M8580 Other specified disorders of bone density and structure, unspecified site: Secondary | ICD-10-CM | POA: Diagnosis not present

## 2014-09-06 DIAGNOSIS — H2513 Age-related nuclear cataract, bilateral: Secondary | ICD-10-CM | POA: Diagnosis not present

## 2014-09-25 DIAGNOSIS — Z85828 Personal history of other malignant neoplasm of skin: Secondary | ICD-10-CM | POA: Diagnosis not present

## 2014-09-25 DIAGNOSIS — L603 Nail dystrophy: Secondary | ICD-10-CM | POA: Diagnosis not present

## 2014-09-25 DIAGNOSIS — L820 Inflamed seborrheic keratosis: Secondary | ICD-10-CM | POA: Diagnosis not present

## 2014-09-25 DIAGNOSIS — D485 Neoplasm of uncertain behavior of skin: Secondary | ICD-10-CM | POA: Diagnosis not present

## 2014-09-25 DIAGNOSIS — D2371 Other benign neoplasm of skin of right lower limb, including hip: Secondary | ICD-10-CM | POA: Diagnosis not present

## 2014-09-25 DIAGNOSIS — L570 Actinic keratosis: Secondary | ICD-10-CM | POA: Diagnosis not present

## 2014-10-11 ENCOUNTER — Other Ambulatory Visit: Payer: Self-pay | Admitting: *Deleted

## 2014-10-11 DIAGNOSIS — C3412 Malignant neoplasm of upper lobe, left bronchus or lung: Secondary | ICD-10-CM

## 2014-10-17 DIAGNOSIS — Z1231 Encounter for screening mammogram for malignant neoplasm of breast: Secondary | ICD-10-CM | POA: Diagnosis not present

## 2014-10-17 DIAGNOSIS — Z803 Family history of malignant neoplasm of breast: Secondary | ICD-10-CM | POA: Diagnosis not present

## 2014-10-18 DIAGNOSIS — L649 Androgenic alopecia, unspecified: Secondary | ICD-10-CM | POA: Diagnosis not present

## 2014-10-18 DIAGNOSIS — L603 Nail dystrophy: Secondary | ICD-10-CM | POA: Diagnosis not present

## 2014-10-18 DIAGNOSIS — Z85828 Personal history of other malignant neoplasm of skin: Secondary | ICD-10-CM | POA: Diagnosis not present

## 2014-10-18 DIAGNOSIS — D225 Melanocytic nevi of trunk: Secondary | ICD-10-CM | POA: Diagnosis not present

## 2014-10-18 DIAGNOSIS — L821 Other seborrheic keratosis: Secondary | ICD-10-CM | POA: Diagnosis not present

## 2014-10-31 DIAGNOSIS — H18412 Arcus senilis, left eye: Secondary | ICD-10-CM | POA: Diagnosis not present

## 2014-10-31 DIAGNOSIS — H18411 Arcus senilis, right eye: Secondary | ICD-10-CM | POA: Diagnosis not present

## 2014-10-31 DIAGNOSIS — H2512 Age-related nuclear cataract, left eye: Secondary | ICD-10-CM | POA: Diagnosis not present

## 2014-10-31 DIAGNOSIS — H2511 Age-related nuclear cataract, right eye: Secondary | ICD-10-CM | POA: Diagnosis not present

## 2014-11-01 DIAGNOSIS — M8580 Other specified disorders of bone density and structure, unspecified site: Secondary | ICD-10-CM | POA: Diagnosis not present

## 2014-11-01 DIAGNOSIS — C3490 Malignant neoplasm of unspecified part of unspecified bronchus or lung: Secondary | ICD-10-CM | POA: Diagnosis not present

## 2014-11-01 DIAGNOSIS — M150 Primary generalized (osteo)arthritis: Secondary | ICD-10-CM | POA: Diagnosis not present

## 2014-11-01 DIAGNOSIS — R29898 Other symptoms and signs involving the musculoskeletal system: Secondary | ICD-10-CM | POA: Diagnosis not present

## 2014-11-01 DIAGNOSIS — M0589 Other rheumatoid arthritis with rheumatoid factor of multiple sites: Secondary | ICD-10-CM | POA: Diagnosis not present

## 2014-11-02 ENCOUNTER — Ambulatory Visit: Payer: Medicare Other | Admitting: Podiatry

## 2014-11-02 ENCOUNTER — Encounter: Payer: Self-pay | Admitting: Podiatry

## 2014-11-02 ENCOUNTER — Ambulatory Visit (INDEPENDENT_AMBULATORY_CARE_PROVIDER_SITE_OTHER): Payer: Medicare Other | Admitting: Cardiothoracic Surgery

## 2014-11-02 ENCOUNTER — Ambulatory Visit
Admission: RE | Admit: 2014-11-02 | Discharge: 2014-11-02 | Disposition: A | Discharge status: Home / Self Care | Payer: Medicare Other | Source: Ambulatory Visit | Attending: Cardiothoracic Surgery | Admitting: Cardiothoracic Surgery

## 2014-11-02 ENCOUNTER — Ambulatory Visit (INDEPENDENT_AMBULATORY_CARE_PROVIDER_SITE_OTHER): Payer: Medicare Other | Admitting: Podiatry

## 2014-11-02 ENCOUNTER — Encounter: Payer: Self-pay | Admitting: Cardiothoracic Surgery

## 2014-11-02 VITALS — BP 156/87 | HR 80 | Resp 20 | Ht 62.0 in | Wt 170.0 lb

## 2014-11-02 DIAGNOSIS — Z85118 Personal history of other malignant neoplasm of bronchus and lung: Secondary | ICD-10-CM

## 2014-11-02 DIAGNOSIS — M79676 Pain in unspecified toe(s): Secondary | ICD-10-CM | POA: Diagnosis not present

## 2014-11-02 DIAGNOSIS — C3490 Malignant neoplasm of unspecified part of unspecified bronchus or lung: Secondary | ICD-10-CM | POA: Diagnosis not present

## 2014-11-02 DIAGNOSIS — B351 Tinea unguium: Secondary | ICD-10-CM | POA: Diagnosis not present

## 2014-11-02 DIAGNOSIS — C3412 Malignant neoplasm of upper lobe, left bronchus or lung: Secondary | ICD-10-CM

## 2014-11-02 DIAGNOSIS — Z23 Encounter for immunization: Secondary | ICD-10-CM | POA: Diagnosis not present

## 2014-11-02 NOTE — Progress Notes (Signed)
Patient ID: Taylor Berry, female   DOB: 1940-04-22, 74 y.o.   MRN: [id number] Complaint:  Visit Type: Patient returns to my office for continued preventative foot care services. Complaint: Patient states" my nails have grown long and thick and become painful to walk and wear shoes".She presents for preventative foot care services. No changes to ROS  Podiatric Exam: Vascular: dorsalis pedis and posterior tibial pulses are palpable bilateral. Capillary return is immediate. Temperature gradient is WNL. Skin turgor WNL  Sensorium: Normal Semmes Weinstein monofilament test. Normal tactile sensation bilaterally. Nail Exam: Pt has thick disfigured discolored nails with subungual debris noted bilateral entire nail hallux through fifth toenails Ulcer Exam: There is no evidence of ulcer or pre-ulcerative changes or infection. Orthopedic Exam: Muscle tone and strength are WNL. No limitations in general ROM. No crepitus or effusions noted. Foot type and digits show no abnormalities. Bony prominences are unremarkable. Skin: No Porokeratosis. No infection or ulcers  Diagnosis:  Tinea unguium, Pain in right toe, pain in left toes  Treatment & Plan Procedures and Treatment: Consent by patient was obtained for treatment procedures. The patient understood the discussion of treatment and procedures well. All questions were answered thoroughly reviewed. Debridement of mycotic and hypertrophic toenails, 1 through 5 bilateral and clearing of subungual debris. No ulceration, no infection noted.  Return Visit-Office Procedure: Patient instructed to return to the office for a follow up visit 4 months for continued evaluation and treatment.

## 2014-11-02 NOTE — Progress Notes (Signed)
Taylor Berry       [place] [address]             [phone]      Taylor Berry [facility] Medical Record [id number] Date of Birth: 1940/08/08  Referring: Marcy Salvo III* Primary Care: Osborne Casco, MD  Chief Complaint:   POST OP FOLLOW UP 04/12/2013  PREOPERATIVE DIAGNOSIS: Squamous cell carcinoma of the left upper lobe  with limited pulmonary reserve.  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  SURGICAL PROCEDURE: Video bronchoscopy, left video-assisted  thoracoscopy, mini-thoracotomy, wedge resection of left upper lobe with  lymph node dissection, and biopsy of chest wall.  SURGEON: Lanelle Bal, MD  Lung cancer, left upper lobe- Squamous cell    Primary site: Lung (Left)   Staging method: AJCC 7th Edition   Pathologic: Stage IB (T2a, N0, cM0) signed by [name], MD on 04/14/2013  8:31 AM   Summary: Stage IB (T2a, N0, cM0)  History of Present Illness:     Patient returns to the office today with followup CT scan 6 months following  wedge resection of left upper lobe for squamous cell carcinoma stage IB on 04/12/2013.  During her hospital course she developed Klebsiella in her sputum, Escherichia coli in her urine, Pseudomonas at the left chest incision site. She was initially discharged home to SNF .She comes to the office today now exactly 18 year postop. She now goes to exercise class III days a week , No longer uses oxygen at home or inhalers  she has 50 pounds weight loss over the past year which is been purposeful. She's had no hemoptysis.    Zubrod Score: At the time of surgery this patient's most appropriate activity status/level should be described as: '[]'$     0    Normal activity, no symptoms '[x]'$     1    Restricted in physical strenuous activity but ambulatory, able to do out light work '[]'$     2    Ambulatory and capable of self care, unable to do  work activities, up and about >50 % of waking hours                              '[]'$     3    Only limited self care, in bed greater than 50% of waking hours '[]'$     4    Completely disabled, no self care, confined to bed or chair '[]'$     5    Moribund    Past Medical History  Diagnosis Date  . Hypertension   . Rheumatoid arthritis(714.0)   . Osteoporosis   . Allergic rhinitis   . Hyperlipidemia   . Obesity   . OSA (obstructive sleep apnea)   . Osteoarthritis   . Uterine cancer ([facility])   . COPD (chronic obstructive pulmonary disease) ([facility])     cervical cancer  . Shortness of breath   . Cough with expectoration     coughs up phlegm early morning  . GERD (gastroesophageal reflux disease)   . Left bundle branch block      History  Smoking status  . Former Smoker -- 1.00 packs/day for 35 years  . Types: Cigarettes  . Quit date: 01/07/1988  Smokeless tobacco  . Never Used    History  Alcohol Use No    Comment: not  since 1988     Allergies  Allergen Reactions  . Latex     REACTION: itching  . Ace Inhibitors     REACTION: cough  . Hydrochlorothiazide     REACTION: decreased sodium  . Penicillins     REACTION: intolerant due to yeast infection    Current Outpatient Prescriptions  Medication Sig Dispense Refill  . aspirin EC 81 MG tablet Take 81 mg by mouth daily.    . calcium-vitamin D (OSCAL WITH D) 500-200 MG-UNIT per tablet Take 1 tablet by mouth every other day.    . cholecalciferol (VITAMIN D) 1000 UNITS tablet Take 1,000 Units by mouth daily.    . fluticasone (FLONASE) 50 MCG/ACT nasal spray PLACE 2 SPRAYS INTO THE NOSE TWICE DAILY AS NEEDED. 16 g 1  . lactobacillus acidophilus (BACID) TABS tablet Take 2 tablets by mouth 2 (two) times daily.    Marland Kitchen leflunomide (ARAVA) 20 MG tablet Take 20 mg by mouth daily.     . Omega-3 Fatty Acids (FISH OIL) 600 MG CAPS Take 300 mg by mouth every other day.    . simvastatin (ZOCOR) 20 MG tablet Take 20 mg by mouth daily at 6 PM.   3   . venlafaxine (EFFEXOR-XR) 75 MG 24 hr capsule Take 75 mg by mouth daily.      . vitamin C (ASCORBIC ACID) 500 MG tablet Take 500 mg by mouth daily.     No current facility-administered medications for this visit.       Physical Exam: BP 156/87 mmHg  Pulse 80  Resp 20  Ht '5\' 2"'$  (1.575 m)  Wt 170 lb (77.111 kg)  BMI 31.09 kg/m2  SpO2 95%  General appearance: alert, cooperative and appears older than stated age Neurologic: intact Heart: regular rate and rhythm, S1, S2 normal, no murmur, click, rub or gallop Lungs: diminished breath sounds bibasilar Abdomen: soft, non-tender; bowel sounds normal; no masses,  no organomegaly Extremities: edema Mild ankle edema Wound: Her left chest port sites and incision are well-healed without evidence of drainage erythema or infection On palpation of the left chest wall there is no crepitance noted  Diagnostic Studies & Laboratory data:     Recent Radiology Findings: Ct Chest Wo Contrast  11/02/2014  CLINICAL DATA:  Lung cancer status post left upper lobe wedge resection in 04/2013. History of uterine cancer status post hysterectomy in 1971. EXAM: CT CHEST WITHOUT CONTRAST TECHNIQUE: Multidetector CT imaging of the chest was performed following the standard protocol without IV contrast. COMPARISON:  04/13/2014 FINDINGS: Mediastinum/Nodes: The heart is normal in size. No pericardial effusion. Coronary atherosclerosis. Atherosclerotic calcifications of the aortic arch. Small mediastinal lymph nodes measuring up to 7 mm short axis (series 3/ image 25), unchanged. Visualized left thyroid is mildly enlarged/heterogeneous. Lungs/Pleura: Postsurgical changes related to posterior left upper lobe wedge resection (series 4/image 28). Minimal nodularity along the left fissure (series 4/ image 22), postsurgical. 2-3 mm subpleural nodule in the lateral right lung apex (series 4/image 11, unchanged since 2015, benign. No suspicious pulmonary nodules. Mild scarring  with bronchiectasis in the right middle lobe (series 4/image 40). Mild bronchial wall thickening with subpleural reticulation/ fibrosis in the bilateral lower lobes. No focal consolidation. Underlying mild paraseptal emphysematous changes. No pleural effusion or pneumothorax. Upper abdomen: Visualized upper abdomen is notable for a 10 mm left adrenal adenoma. Musculoskeletal: Degenerative changes of the visualized thoracolumbar spine. IMPRESSION: Postsurgical changes related to left upper lobe wedge resection. No evidence of recurrent or metastatic disease  in the chest. Electronically Signed   By: Julian Hy M.D.   On: 11/02/2014 15:14   Ct Chest Wo Contrast  04/13/2014   CLINICAL DATA:  74 year old female with left upper lobe lung cancer status post resection 6 months ago. Tenderness at the surgical site.  EXAM: CT CHEST WITHOUT CONTRAST  TECHNIQUE: Multidetector CT imaging of the chest was performed following the standard protocol without IV contrast.  COMPARISON:  Chest CT 10/06/2013.  FINDINGS: Mediastinum/Lymph Nodes: Heart size is normal. There is no significant pericardial fluid, thickening or pericardial calcification. There is atherosclerosis of the thoracic aorta, the great vessels of the mediastinum and the coronary arteries, including calcified atherosclerotic plaque in the left main, left anterior descending and right coronary arteries. No pathologically enlarged mediastinal or hilar lymph nodes. Please note that accurate exclusion of hilar adenopathy is limited on noncontrast CT scans. Esophagus is unremarkable in appearance. No axillary lymphadenopathy.  Lungs/Pleura: Postoperative changes of wedge resection are again noted in the lateral aspect of the left upper lobe. Again noted is a large peripheral postoperative pneumatocele in this region, which has decreased significantly in size compared to the prior study. A few scattered 2-3 mm micronodules are noted throughout the lungs bilaterally,  peribronchovascular in distribution, favored to represent areas of chronic mucoid impaction within terminal bronchioles, similar to prior studies. No larger more suspicious appearing pulmonary nodules or masses are noted. Mild diffuse bronchial wall thickening, with some scattered areas of cylindrical and mild varicose bronchiectasis, most evident in the lower lobes of the lungs bilaterally. No acute consolidative airspace disease. No pleural effusions. There continues to be a small amount of herniation have the left lower lobe through the fifth intercostal space laterally, at the site of thoracotomy. Trace amount of residual gas in the left serratus musculature, significantly decreased compared to the prior examination.  Upper Abdomen: Atherosclerosis.  Musculoskeletal/Soft Tissues: Postthoracotomy changes in the left ribs. There are no aggressive appearing lytic or blastic lesions noted in the visualized portions of the skeleton.  IMPRESSION: 1. Status post left upper lobe wedge resection, with no findings to suggest local recurrence of disease. Decreasing size of postoperative the mass steel in the left upper lobe, and decreasing herniation through the left fifth intercostal space, as discussed above. 2. Diffuse bronchial wall thickening with areas of cylindrical and mild varicose bronchiectasis, most evident in the lower lobes of the lungs bilaterally. These findings are similar to recent prior examinations. 3. Atherosclerosis, including left main and 2 vessel coronary artery disease. Assessment for potential risk factor modification, dietary therapy or pharmacologic therapy may be warranted, if clinically indicated. 4. Additional incidental findings, as above.   Electronically Signed   By: Vinnie Langton M.D.   On: 04/13/2014 13:24   Dg Chest 2 View  10/06/2013   CLINICAL DATA:  History of squamous cell carcinoma of the left upper lobe. Status post wedge resection of the left upper lobe with lymph node  dissection and biopsy of the chest wall 04/12/2013.  EXAM: CHEST  2 VIEW  COMPARISON:  PA and lateral chest 08/22/2013, 07/19/2013 and 06/02/2013. CT chest 02/01/2013.  FINDINGS: Cystic lesion in the left upper lobe consistent with history of tumor section is unchanged. A very small amount of gas is seen along the left chest wall and the left breast, decreased since the prior exams. Pulmonary interstitium is somewhat coarsened but unchanged. Heart size is normal. No pneumothorax is identified. Left rib deformities are again seen.  IMPRESSION: No acute abnormality.  Postoperative change left upper lobe. Small volume of subcutaneous gas in the left chest wall and left breast has decreased. No new abnormality since the prior examinations.   Electronically Signed   By: Inge Rise M.D.   On: 10/06/2013 11:54   Ct Chest Wo Contrast  10/06/2013   CLINICAL DATA:  Followup left lung carcinoma. 44month status post surgical resection.  EXAM: CT CHEST WITHOUT CONTRAST  TECHNIQUE: Multidetector CT imaging of the chest was performed following the standard protocol without IV contrast.  COMPARISON:  02/01/2013  FINDINGS: Mediastinum/Hilar Regions: No masses or pathologically enlarged lymph nodes identified.  Other Thoracic Lymphadenopathy:  None.  Lungs: Postop changes are noted in the posterior left upper lobe. A peripheral pneumatocele is seen which shows intercostal herniation at the surgical site. No pneumothorax identified. No suspicious pulmonary nodules or masses are identified. Mild bilateral lower lobe bronchiectasis noted, left side greater than right.  Pleura:  No evidence of effusion or mass.  Vascular/Cardiac: No thoracic aortic aneurysm or other significant abnormality identified.  Musculoskeletal:  No suspicious bone lesions identified.  Other:  None.  IMPRESSION: Postop changes in left upper lobe, with incidentally noted peripheral pneumatocele showing small intercostal herniation at the thoracotomy site.   No evidence of residual, recurrent, or metastatic carcinoma in the thorax.   Electronically Signed   By: JEarle GellM.D.   On: 10/06/2013 13:01    Dg Chest 2 View  07/19/2013   CLINICAL DATA:  Pain and suspected gas in the region of lateral left chest wall incision  EXAM: CHEST  2 VIEW  COMPARISON:  PA and lateral chest x-ray of Jun 02, 2013  FINDINGS: There is air in the subcutaneous tissues of the left axillary region. Adjacent to this there is a focal stable appearing pleural space air collection at the site of the previous wedge resection. Suture material is noted in the medial aspect of this gas collection. There is no mediastinal shift. There is no pleural effusion. No acute abnormality elsewhere on the left nor of the right lung is demonstrated. The heart and pulmonary vascularity are normal. Are ununited fractures associated with the fifth and sixth ribs laterally and a healing fracture of the eighth rib.  IMPRESSION: 1. There is subcutaneous gas which may communicate with a stable appearing pleural based gas collection at the site of previous surgery in the left mid thorax laterally. 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 There are unhealed fractures of the adjacent left fifth,sixth, and eighth ribs laterally. 2. The lungs elsewhere are clear and exhibit emphysematous change. There is no evidence of CHF. There is no pleural effusion. 3. Chest CT scanning with or without contrast may be useful. 4. These results will be called to the ordering clinician or representative by the Radiologist Assistant, and communication documented in the PACS or zVision Dashboard.   Electronically Signed   By: David  JMartinique  On: 07/19/2013 11:35    Recent Lab Findings: Lab Results  Component Value Date   WBC 10.5 04/27/2013   HGB 9.4* 04/27/2013   HCT 29.3* 04/27/2013   PLT 274 04/27/2013   GLUCOSE 117* 04/27/2013   ALT 7 04/14/2013   AST 19 04/14/2013   NA 135* 04/27/2013   K 4.5 04/27/2013   CL 98  04/27/2013   CREATININE 0.49* 04/27/2013   BUN 11 04/27/2013   CO2 24 04/27/2013   INR 0.91 04/11/2013   Wt Readings from Last 3 Encounters:  11/02/14 170 lb (77.111 kg)  05/16/14  173 lb (78.472 kg)  04/13/14 166 lb (75.297 kg)     Assessment / Plan:   Patient is now exactly 18 months months   following wedge resection of stage IB squamous cell carcinoma of the left upper lobe without evidence of recurrence on CT scan  She's recovering from a prolonged postoperative course related to her severe underlying lung disease and rheumatoid arthritis.  Overall her COPD / breathlessness has markedly improved with significant weight loss. The  Pneumatocele has almost completely resolved   Plan to see back in 6 months with a followup CT which will be approximately 18 months postop  [name] MD      [address] [place] [address] Office [phone]   Beeper [id number]  11/02/2014 4:23 PM

## 2014-11-03 NOTE — Progress Notes (Signed)
Subjective:     Patient ID: Taylor Berry, female   DOB: 1940/08/10, 74 y.o.   MRN: [id number]  HPI patient presents with elongated thickened nailbeds 1-5 both feet that are painful   Review of Systems     Objective:   Physical Exam Neurovascular status intact thick yellow brittle nailbeds 1-5 both feet that are painful    Assessment:     Mycotic nail infections 1-5 both feet with pain    Plan:     Debride painful nailbeds 1-5 both feet with no iatrogenic bleeding noted

## 2014-11-20 DIAGNOSIS — H2513 Age-related nuclear cataract, bilateral: Secondary | ICD-10-CM | POA: Diagnosis not present

## 2014-11-20 DIAGNOSIS — H2511 Age-related nuclear cataract, right eye: Secondary | ICD-10-CM | POA: Diagnosis not present

## 2014-11-20 DIAGNOSIS — H25811 Combined forms of age-related cataract, right eye: Secondary | ICD-10-CM | POA: Diagnosis not present

## 2014-11-21 DIAGNOSIS — H2512 Age-related nuclear cataract, left eye: Secondary | ICD-10-CM | POA: Diagnosis not present

## 2014-12-14 IMAGING — CR DG CHEST 2V
2 series · 2 of 2 positions shown · non-contrast
Comparison: April 18, 2013

CLINICAL DATA: Postoperative lung surgery, shortness of breath

EXAM:
CHEST  2 VIEW

[w chest pa]
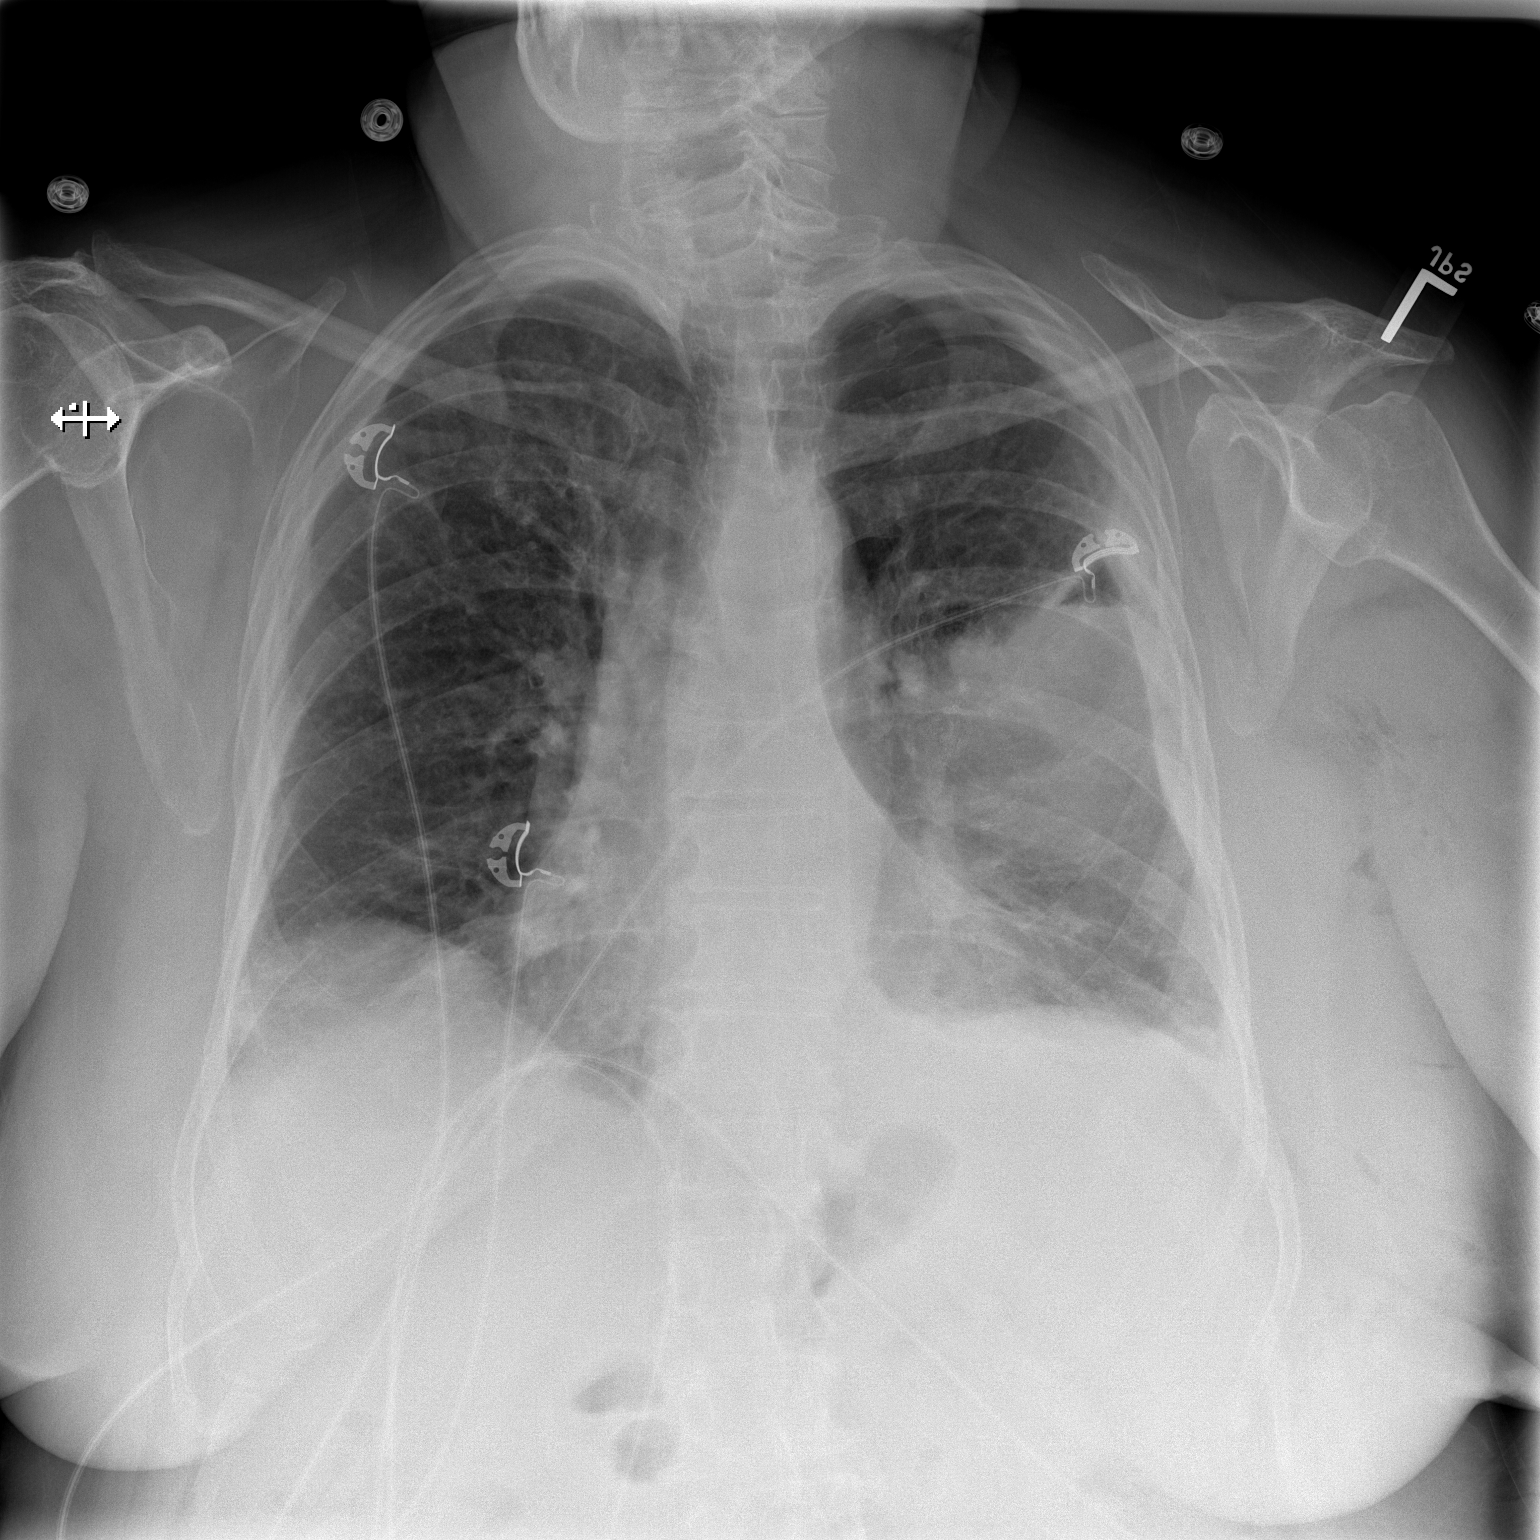

[w chest lat]
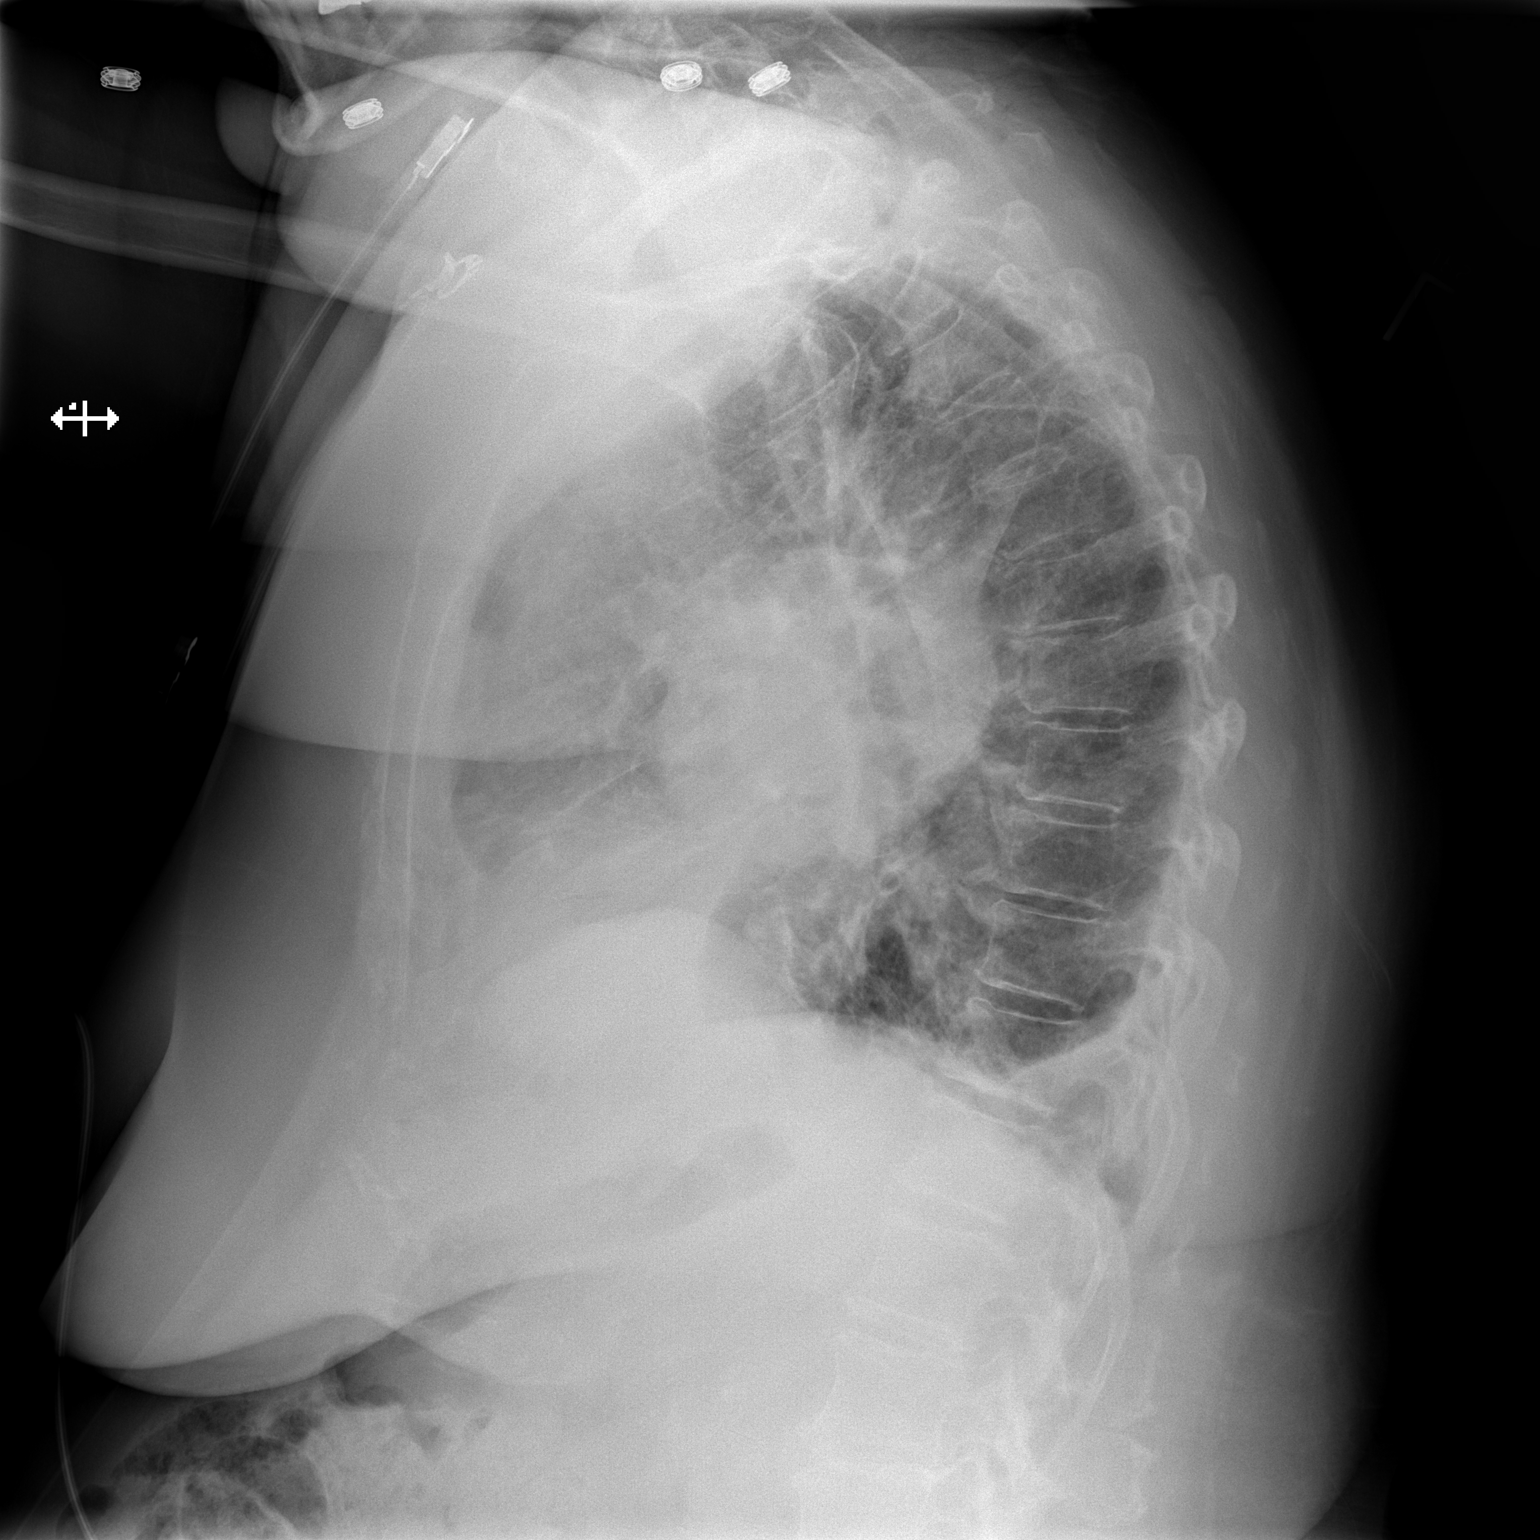

[2 of 2 positions shown; findings below may reference images not displayed]

FINDINGS: The heart size and mediastinal contours are stable. There is
persistent consolidation of the left mid lung unchanged. There is
small left apical pneumothorax unchanged. There is a small left
pleural effusion. Minimal right lung base atelectasis is unchanged.
The visualized skeletal structures are stable.
IMPRESSION: Persistent consolidation of the left mid lung and small left apical
pneumothorax unchanged compared to prior exam of April 18, 2013

## 2014-12-17 IMAGING — CR DG CHEST 1V
1 series · 1 of 1 positions shown · non-contrast
Comparison: 04/21/2013 and earlier.

CLINICAL DATA: 72-year-old female with shortness of Breath status
post pulmonary wedge resection. Initial encounter.

EXAM:
CHEST - 1 VIEW

[w chest pa]
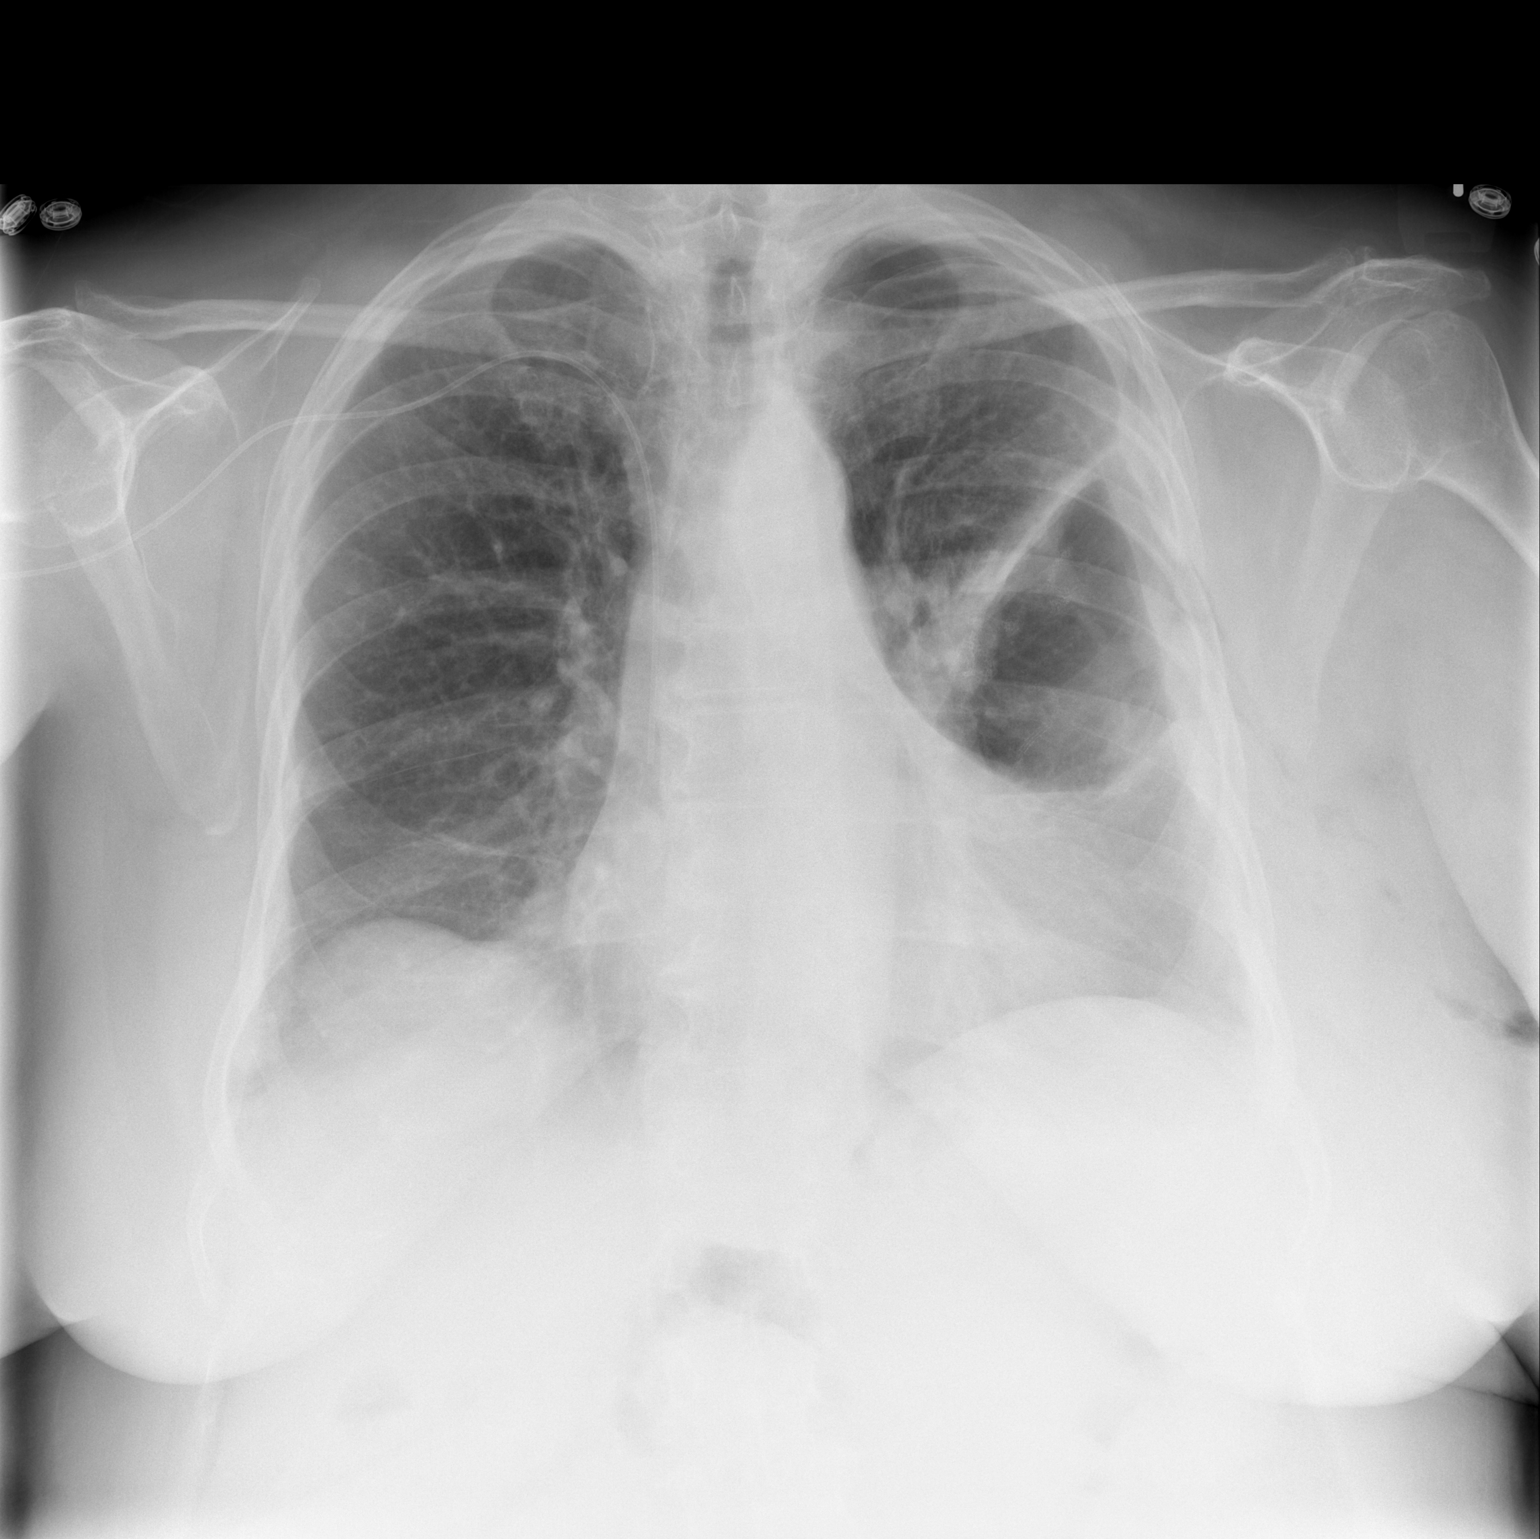

[1 of 1 positions shown; findings below may reference images not displayed]

FINDINGS: PA view of the chest at 1136 hrs. New right PICC line. Small left
pneumothorax is unchanged. Decreased indistinct opacity in the left
mid lung. At the same time improved left lung base ventilation and
regressed evidence of left pleural effusion. Small volume left chest
subcutaneous gas remains. Stable cardiac size and mediastinal
contours. Right lung remains grossly clear.
IMPRESSION: 1. Stable small left pneumothorax.
2. Improving ventilation in the left lung, decreasing left pleural
effusion.
3. No new cardiopulmonary abnormality.

## 2014-12-22 DIAGNOSIS — H25812 Combined forms of age-related cataract, left eye: Secondary | ICD-10-CM | POA: Diagnosis not present

## 2014-12-22 DIAGNOSIS — H2512 Age-related nuclear cataract, left eye: Secondary | ICD-10-CM | POA: Diagnosis not present

## 2014-12-22 DIAGNOSIS — H2513 Age-related nuclear cataract, bilateral: Secondary | ICD-10-CM | POA: Diagnosis not present

## 2014-12-28 ENCOUNTER — Telehealth: Payer: Self-pay | Admitting: *Deleted

## 2014-12-28 NOTE — Telephone Encounter (Signed)
  Oncology Nurse Navigator Documentation    Navigator Encounter Type: Telephone (12/28/14 1300)     Barriers/Navigation Needs: No barriers at this time (12/28/14 1300)   Interventions: None required (12/28/14 1300)  I called patient to check in.  Patient reports that she is doing pretty well.  She has had cataract surgery and due to being immunosuppressed by her rheumatoid arthritis treatment, her healing and recovery has been prolonged.  She continues to use drops to treat her eye.  She denied any needs at this time.  I encouraged her to call me if she needs any assistance.          Time Spent with Patient: 15 (12/28/14 1300)

## 2015-01-09 DIAGNOSIS — R29898 Other symptoms and signs involving the musculoskeletal system: Secondary | ICD-10-CM | POA: Diagnosis not present

## 2015-01-09 DIAGNOSIS — M8580 Other specified disorders of bone density and structure, unspecified site: Secondary | ICD-10-CM | POA: Diagnosis not present

## 2015-01-09 DIAGNOSIS — M150 Primary generalized (osteo)arthritis: Secondary | ICD-10-CM | POA: Diagnosis not present

## 2015-01-09 DIAGNOSIS — C3490 Malignant neoplasm of unspecified part of unspecified bronchus or lung: Secondary | ICD-10-CM | POA: Diagnosis not present

## 2015-01-09 DIAGNOSIS — M0589 Other rheumatoid arthritis with rheumatoid factor of multiple sites: Secondary | ICD-10-CM | POA: Diagnosis not present

## 2015-01-18 DIAGNOSIS — Z961 Presence of intraocular lens: Secondary | ICD-10-CM | POA: Diagnosis not present

## 2015-01-23 DIAGNOSIS — C3492 Malignant neoplasm of unspecified part of left bronchus or lung: Secondary | ICD-10-CM | POA: Diagnosis not present

## 2015-01-23 DIAGNOSIS — Z86010 Personal history of colonic polyps: Secondary | ICD-10-CM | POA: Diagnosis not present

## 2015-01-23 DIAGNOSIS — M069 Rheumatoid arthritis, unspecified: Secondary | ICD-10-CM | POA: Diagnosis not present

## 2015-01-31 ENCOUNTER — Encounter (HOSPITAL_COMMUNITY): Payer: Self-pay | Admitting: *Deleted

## 2015-02-06 ENCOUNTER — Encounter: Payer: Self-pay | Admitting: Podiatry

## 2015-02-06 ENCOUNTER — Ambulatory Visit (INDEPENDENT_AMBULATORY_CARE_PROVIDER_SITE_OTHER): Payer: PPO | Admitting: Podiatry

## 2015-02-06 DIAGNOSIS — B351 Tinea unguium: Secondary | ICD-10-CM | POA: Diagnosis not present

## 2015-02-06 DIAGNOSIS — M79676 Pain in unspecified toe(s): Secondary | ICD-10-CM | POA: Diagnosis not present

## 2015-02-06 NOTE — Progress Notes (Signed)
Patient ID: Taylor Berry, female   DOB: [birth year], 75 y.o.   MRN: [id number] Complaint:  Visit Type: Patient returns to my office for continued preventative foot care services. Complaint: Patient states" my nails have grown long and thick and become painful to walk and wear shoes".She presents for preventative foot care services. No changes to ROS  Podiatric Exam: Vascular: dorsalis pedis and posterior tibial pulses are palpable bilateral. Capillary return is immediate. Temperature gradient is WNL. Skin turgor WNL  Sensorium: Normal Semmes Weinstein monofilament test. Normal tactile sensation bilaterally. Nail Exam: Pt has thick disfigured discolored nails with subungual debris noted bilateral entire nail hallux through fifth toenails Ulcer Exam: There is no evidence of ulcer or pre-ulcerative changes or infection. Orthopedic Exam: Muscle tone and strength are WNL. No limitations in general ROM. No crepitus or effusions noted. Foot type and digits show no abnormalities. Bony prominences are unremarkable. Skin: No Porokeratosis. No infection or ulcers  Diagnosis:  Tinea unguium, Pain in right toe, pain in left toes  Treatment & Plan Procedures and Treatment: Consent by patient was obtained for treatment procedures. The patient understood the discussion of treatment and procedures well. All questions were answered thoroughly reviewed. Debridement of mycotic and hypertrophic toenails, 1 through 5 bilateral and clearing of subungual debris. No ulceration, no infection noted. Patient desires superficial spreading fungus be debrided.  Return Visit-Office Procedure: Patient instructed to return to the office for a follow up visit 3 months for continued evaluation and treatment.   Gardiner Barefoot DPM

## 2015-02-13 ENCOUNTER — Other Ambulatory Visit: Payer: Self-pay | Admitting: Gastroenterology

## 2015-02-13 ENCOUNTER — Ambulatory Visit (HOSPITAL_COMMUNITY): Payer: PPO | Admitting: Certified Registered"

## 2015-02-13 ENCOUNTER — Ambulatory Visit (HOSPITAL_COMMUNITY)
Admission: RE | Admit: 2015-02-13 | Discharge: 2015-02-13 | Disposition: A | Discharge status: Home / Self Care | Payer: PPO | Source: Ambulatory Visit | Attending: Gastroenterology | Admitting: Gastroenterology

## 2015-02-13 ENCOUNTER — Encounter (HOSPITAL_COMMUNITY): Payer: Self-pay | Admitting: Certified Registered"

## 2015-02-13 ENCOUNTER — Encounter (HOSPITAL_COMMUNITY)
Admission: RE | Disposition: A | Discharge status: Home / Self Care | Payer: Self-pay | Source: Ambulatory Visit | Attending: Gastroenterology

## 2015-02-13 DIAGNOSIS — M1990 Unspecified osteoarthritis, unspecified site: Secondary | ICD-10-CM | POA: Insufficient documentation

## 2015-02-13 DIAGNOSIS — G4733 Obstructive sleep apnea (adult) (pediatric): Secondary | ICD-10-CM | POA: Diagnosis not present

## 2015-02-13 DIAGNOSIS — J440 Chronic obstructive pulmonary disease with acute lower respiratory infection: Secondary | ICD-10-CM | POA: Diagnosis not present

## 2015-02-13 DIAGNOSIS — Z1211 Encounter for screening for malignant neoplasm of colon: Secondary | ICD-10-CM | POA: Diagnosis not present

## 2015-02-13 DIAGNOSIS — Z79899 Other long term (current) drug therapy: Secondary | ICD-10-CM | POA: Insufficient documentation

## 2015-02-13 DIAGNOSIS — Z8542 Personal history of malignant neoplasm of other parts of uterus: Secondary | ICD-10-CM | POA: Diagnosis not present

## 2015-02-13 DIAGNOSIS — E785 Hyperlipidemia, unspecified: Secondary | ICD-10-CM | POA: Insufficient documentation

## 2015-02-13 DIAGNOSIS — E7800 Pure hypercholesterolemia, unspecified: Secondary | ICD-10-CM | POA: Insufficient documentation

## 2015-02-13 DIAGNOSIS — Z87891 Personal history of nicotine dependence: Secondary | ICD-10-CM | POA: Insufficient documentation

## 2015-02-13 DIAGNOSIS — Z7951 Long term (current) use of inhaled steroids: Secondary | ICD-10-CM | POA: Diagnosis not present

## 2015-02-13 DIAGNOSIS — K648 Other hemorrhoids: Secondary | ICD-10-CM | POA: Diagnosis not present

## 2015-02-13 DIAGNOSIS — K5730 Diverticulosis of large intestine without perforation or abscess without bleeding: Secondary | ICD-10-CM | POA: Diagnosis not present

## 2015-02-13 DIAGNOSIS — F329 Major depressive disorder, single episode, unspecified: Secondary | ICD-10-CM | POA: Insufficient documentation

## 2015-02-13 DIAGNOSIS — Z7982 Long term (current) use of aspirin: Secondary | ICD-10-CM | POA: Insufficient documentation

## 2015-02-13 DIAGNOSIS — I10 Essential (primary) hypertension: Secondary | ICD-10-CM | POA: Insufficient documentation

## 2015-02-13 DIAGNOSIS — Z86010 Personal history of colonic polyps: Secondary | ICD-10-CM | POA: Diagnosis not present

## 2015-02-13 DIAGNOSIS — K5790 Diverticulosis of intestine, part unspecified, without perforation or abscess without bleeding: Secondary | ICD-10-CM | POA: Diagnosis not present

## 2015-02-13 DIAGNOSIS — M069 Rheumatoid arthritis, unspecified: Secondary | ICD-10-CM | POA: Insufficient documentation

## 2015-02-13 DIAGNOSIS — K219 Gastro-esophageal reflux disease without esophagitis: Secondary | ICD-10-CM | POA: Insufficient documentation

## 2015-02-13 DIAGNOSIS — Z85118 Personal history of other malignant neoplasm of bronchus and lung: Secondary | ICD-10-CM | POA: Diagnosis not present

## 2015-02-13 HISTORY — PX: COLONOSCOPY WITH PROPOFOL: SHX5780

## 2015-02-13 HISTORY — DX: Other complications of anesthesia, initial encounter: T88.59XA

## 2015-02-13 HISTORY — DX: Adverse effect of unspecified anesthetic, initial encounter: T41.45XA

## 2015-02-13 HISTORY — DX: Malignant neoplasm of unspecified part of unspecified bronchus or lung: C34.90

## 2015-02-13 SURGERY — COLONOSCOPY WITH PROPOFOL
Anesthesia: Monitor Anesthesia Care

## 2015-02-13 MED ORDER — PROPOFOL 10 MG/ML IV BOLUS
INTRAVENOUS | Status: AC
  Filled 2015-02-13: qty 40

## 2015-02-13 MED ORDER — PROPOFOL 500 MG/50ML IV EMUL
INTRAVENOUS | Status: DC | PRN
  Administered 2015-02-13: 125 ug/kg/min via INTRAVENOUS

## 2015-02-13 MED ORDER — PROPOFOL 10 MG/ML IV BOLUS
INTRAVENOUS | Status: AC
  Filled 2015-02-13: qty 20

## 2015-02-13 MED ORDER — LIDOCAINE HCL (CARDIAC) 20 MG/ML IV SOLN
INTRAVENOUS | Status: AC
  Filled 2015-02-13: qty 5

## 2015-02-13 MED ORDER — LIDOCAINE HCL (CARDIAC) 20 MG/ML IV SOLN
INTRAVENOUS | Status: DC | PRN
  Administered 2015-02-13: 50 mg via INTRAVENOUS

## 2015-02-13 MED ORDER — SODIUM CHLORIDE 0.9 % IV SOLN: INTRAVENOUS | Status: DC

## 2015-02-13 MED ORDER — LACTATED RINGERS IV SOLN
INTRAVENOUS | Status: DC | PRN
  Administered 2015-02-13: 13:00:00 via INTRAVENOUS

## 2015-02-13 MED ORDER — PROPOFOL 10 MG/ML IV BOLUS
INTRAVENOUS | Status: DC | PRN
  Administered 2015-02-13 (×2): 50 mg via INTRAVENOUS

## 2015-02-13 SURGICAL SUPPLY — 22 items

## 2015-02-13 NOTE — Anesthesia Preprocedure Evaluation (Signed)
Anesthesia Evaluation  Patient identified by MRN, date of birth, ID band Patient awake    Reviewed: Allergy & Precautions, NPO status , Patient's Chart, lab work & pertinent test results  Airway Mallampati: I  TM Distance: >3 FB     Dental   Pulmonary sleep apnea , COPD, former smoker,    Pulmonary exam normal        Cardiovascular hypertension, Normal cardiovascular exam+ dysrhythmias      Neuro/Psych Anxiety Depression    GI/Hepatic GERD  ,  Endo/Other    Renal/GU      Musculoskeletal  (+) Arthritis ,   Abdominal   Peds  Hematology  (+) anemia ,   Anesthesia Other Findings   Reproductive/Obstetrics                             Anesthesia Physical Anesthesia Plan  ASA: II  Anesthesia Plan: MAC   Post-op Pain Management:    Induction: Intravenous  Airway Management Planned: Mask  Additional Equipment:   Intra-op Plan:   Post-operative Plan:   Informed Consent: I have reviewed the patients History and Physical, chart, labs and discussed the procedure including the risks, benefits and alternatives for the proposed anesthesia with the patient or authorized representative who has indicated his/her understanding and acceptance.     Plan Discussed with: CRNA, Anesthesiologist and Surgeon  Anesthesia Plan Comments:         Anesthesia Quick Evaluation

## 2015-02-13 NOTE — Transfer of Care (Signed)
Immediate Anesthesia Transfer of Care Note  Patient: Taylor Berry  Procedure(s) Performed: Procedure(s): COLONOSCOPY WITH PROPOFOL (N/A)  Patient Location: PACU  Anesthesia Type:MAC  Level of Consciousness: awake, alert  and oriented  Airway & Oxygen Therapy: Patient Spontanous Breathing and Patient connected to face mask oxygen  Post-op Assessment: Report given to RN and Post -op Vital signs reviewed and stable  Post vital signs: Reviewed and stable  Last Vitals:  Filed Vitals:   02/13/15 1331  BP: 148/85  Pulse: 84  Temp: 36.6 C  Resp: 16    Complications: No apparent anesthesia complications

## 2015-02-13 NOTE — Op Note (Signed)
[facility] [place], [id number]   COLONOSCOPY PROCEDURE REPORT  PATIENT: Taylor Berry, Taylor Berry  MR#: [id number] BIRTHDATE: September 16, 1940 , 74  yrs. old GENDER: female ENDOSCOPIST: Laurence Spates, MD REFERRED BY:  Dr. Kelton Pillar PROCEDURE DATE:  Feb 21, 2015 PROCEDURE:   Colonoscopy ASA CLASS:   III INDICATIONS: atient with history of previous colon polyps in need of surveillance colonoscopy MEDICATIONS: MAC  DESCRIPTION OF PROCEDURE:   After the risks and benefits and of the procedure were explained, informed consent was obtained.  igital exam was normal         The Pentax Ped Colon [id number]  endoscope was introduced through the anus and advanced to the cecum      .  The quality of the prep was  good      .  the patient had extensive diverticulosisand this made the procedure difficult. There was a lot of looping in the colon was tortuous. We were eventually able to reach the cecum identified by the appendiceal orificeand ileocecal valve. The instrument was then slowly withdrawn as the colon was fully examined. Estimated blood loss is zero unless otherwise noted in this procedure report. here was extensive diverticulosis but no polyps or other lesions were sseen. Some hemorrhoids in the retroflex view.   The scope was then withdrawn from the patient and the procedure completed.  WITHDRAWAL TIME: 10 minutes  COMPLICATIONS: There were no immediate complications. ENDOSCOPIC IMPRESSION: 1. History of colon polyps. Colonoscopy negative at this time. 2. Extensive pan diverticulosis 3. Internal hemorrhoids RECOMMENDATIONS: 1. Resume medications 2. Given her age would not repeat surveillance colonoscopy would recommend resuming yearly hemoccult's in 5 years and repeat colonoscopy only for positivestool.  REPEAT EXAM:  cc: Dr. Kelton Pillar, Dr. Eleanora Neighbor  _______________________________ eSigned:  Laurence Spates, MD 02-21-15 2:26 PM   CPT  CODES: ICD CODES:  The ICD and CPT codes recommended by this software are interpretations from the data that the clinical staff has captured with the software.  The verification of the translation of this report to the ICD and CPT codes and modifiers is the sole responsibility of the health care institution and practicing physician where this report was generated.  [employer] will not be held responsible for the validity of the ICD and CPT codes included on this report.  AMA assumes no liability for data contained or not contained herein. CPT is a Designer, television/film set of the [employer].   PATIENT NAME:  Taylor Berry, Taylor Berry MR#: [id number]

## 2015-02-13 NOTE — Anesthesia Postprocedure Evaluation (Signed)
Anesthesia Post Note  Patient: Taylor Berry  Procedure(s) Performed: Procedure(s) (LRB): COLONOSCOPY WITH PROPOFOL (N/A)  Patient location during evaluation: Endoscopy Anesthesia Type: MAC Level of consciousness: awake, oriented, awake and alert and patient cooperative Pain management: pain level controlled Vital Signs Assessment: post-procedure vital signs reviewed and stable Respiratory status: spontaneous breathing and respiratory function stable Cardiovascular status: blood pressure returned to baseline Anesthetic complications: no    Last Vitals:  Filed Vitals:   02/13/15 1425 02/13/15 1430  BP: 150/85 174/75  Pulse: 68 70  Temp: 36.5 C   Resp: 25 23    Last Pain: There were no vitals filed for this visit.               Marciano Mundt EDWARD

## 2015-02-13 NOTE — Addendum Note (Signed)
Addended by: Vernie Ammons. on: 02/13/2015 11:44 AM   Modules accepted: Orders

## 2015-02-13 NOTE — H&P (Signed)
Subjective:   Patient is a 75 y.o. female presents with prior history of colon polyps. She had colonoscopy with adenoma removed 2012. Had extensive diverticulosis. Since that time she has had lung cancer in her surveillance colonoscopy is been on hold. She underwent wedge resection has been doing quite well following this. She had squamous carcinoma. She has been released from pulmonary as well as from the lung surgeons. She is back to exercising and working out in the gym has been doing quite well. We did decide to go ahead and repeat her surveillance colonoscopy.. Procedure including risks and benefits discussed in office.  Patient Active Problem List   Diagnosis Date Noted  . Pneumothorax, left 08/31/2013  . Acute bronchitis 05/10/2013  . Anemia in neoplastic disease 05/02/2013  . Left Chest Wound infection 04/29/2013  . Acute blood loss anemia 04/29/2013  . Allergic rhinitis 04/29/2013  . Depression 04/29/2013  . Acute respiratory failure ([place]) 04/22/2013  . Lung cancer, left upper lobe 02/03/2013  . Pulmonary infiltrates 01/05/2013  . Mild pulmonary hypertension ([place] [place]) 02/17/2011  . OSA (obstructive sleep apnea) 02/17/2011  . Dyspnea on exertion 11/22/2010  . COPD (chronic obstructive pulmonary disease) ([place]) 07/20/2009  . Carcinoma in situ of cervix uteri 06/26/2009  . PURE HYPERCHOLESTEROLEMIA 06/26/2009  . OBESITY 06/26/2009  . UNSPECIFIED ANEMIA 06/26/2009  . HYPERTENSION, BENIGN 06/26/2009  . ARTHRITIS, RHEUMATOID 06/26/2009  . OSTEOPOROSIS 06/26/2009  . Cough 06/26/2009   Past Medical History  Diagnosis Date  . Hypertension   . Osteoporosis   . Allergic rhinitis   . Hyperlipidemia   . Obesity   . COPD (chronic obstructive pulmonary disease) ([facility])     cervical cancer  . Shortness of breath   . Cough with expectoration     coughs up phlegm early morning  . GERD (gastroesophageal reflux disease)   . Left bundle branch block   . OSA (obstructive sleep apnea)      denies.  . Claustrophobia     Needs pillow beneath to lay flat-"gets choking feeling oherwise"  . Uterine cancer ([place])   . Lung cancer ([place])     left upper lobe wedge removed-no further tx.  . Rheumatoid arthritis(714.0)     Dr. Stark Jock follows  . Osteoarthritis   . Complication of anesthesia     Fentanyl allergy, "claustrophobia"    Past Surgical History  Procedure Laterality Date  . Vesicovaginal fistula closure w/ tah  1971  . Tonsillectomy    . Colonoscopy w/ biopsies and polypectomy    . Cardiac catheterization  01/27/11    minor non-obs CAD, NL EF, mild pulm HTN  . Video bronchoscopy N/A 04/12/2013    Procedure: VIDEO BRONCHOSCOPY;  Surgeon: [name], MD;  Location: [facility] OR;  Service: Thoracic;  Laterality: N/A;  . Video assisted thoracoscopy (vats)/thorocotomy Left 04/12/2013    Procedure: VIDEO ASSISTED THORACOSCOPY (VATS)/THOROCOTOMY, WITH LEFT UPPER LOBE WEDGE RESECTION, CHEST WALL BIOPSY ;  Surgeon: [name], MD;  Location: [facility];  Service: Thoracic;  Laterality: Left;  . Lymph node dissection Left 04/12/2013    Procedure: LYMPH NODE DISSECTION;  Surgeon: [name], MD;  Location: [facility];  Service: Thoracic;  Laterality: Left;  . Cataract extraction, bilateral      last done 12'16-  . Abdominal hysterectomy      in situ carcinoma    Prescriptions prior to admission  Medication Sig Dispense Refill Last Dose  . aspirin EC 81 MG tablet Take 81 mg by mouth  every morning.    02/12/2015 at 0730  . calcium-vitamin D (OSCAL WITH D) 500-200 MG-UNIT per tablet Take 1 tablet by mouth every other day.   02/12/2015 at 0730  . cholecalciferol (VITAMIN D) 1000 UNITS tablet Take 1,000 Units by mouth every morning.    02/12/2015 at 0730  . leflunomide (ARAVA) 20 MG tablet Take 20 mg by mouth every morning.    02/12/2015 at 0730  . Multiple Vitamins-Minerals (HAIR SKIN AND NAILS FORMULA PO) Take 1 tablet by mouth every morning.   02/12/2015 at 0730  . Omega-3 Fatty Acids (FISH  OIL) 600 MG CAPS Take 600 mg by mouth every other day.    02/12/2015 at 0730  . venlafaxine (EFFEXOR-XR) 75 MG 24 hr capsule Take 75 mg by mouth every morning.    02/12/2015 at 0730  . vitamin C (ASCORBIC ACID) 500 MG tablet Take 500 mg by mouth every morning.    02/12/2015 at 0730  . fluticasone (FLONASE) 50 MCG/ACT nasal spray PLACE 2 SPRAYS INTO THE NOSE TWICE DAILY AS NEEDED. (Patient taking differently: PLACE 2 SPRAYS INTO THE NOSE TWICE DAILY AS NEEDED FOR ALLERGIES.) 16 g 1 More than a month at Unknown time  . OVER THE COUNTER MEDICATION    More than a month at Unknown time  . simvastatin (ZOCOR) 20 MG tablet Take 20 mg by mouth daily at 6 PM.   3 More than a month at Unknown time   Allergies  Allergen Reactions  . Latex     REACTION: itching  . Ace Inhibitors     REACTION: cough  . Fentanyl     Hallucination "I go crazy"   . Hydrochlorothiazide     REACTION: decreased sodium  . Penicillins     REACTION: intolerant due to yeast infection Has patient had a PCN reaction causing immediate rash, facial/tongue/throat swelling, SOB or lightheadedness with hypotension:No Has patient had a PCN reaction causing severe rash involving mucus membranes or skin necrosis: No Has patient had a PCN reaction that required hospitalization No Has patient had a PCN reaction occurring within the last 10 years: Yes If all of the above answers are "NO", then may proceed with Cephalosporin use.     Social History  Substance Use Topics  . Smoking status: Former Smoker -- 1.00 packs/day for 35 years    Types: Cigarettes    Quit date: 01/07/1988  . Smokeless tobacco: Never Used  . Alcohol Use: No     Comment: not since 1988    Family History  Problem Relation Age of Onset  . Lung cancer Mother   . Coronary artery disease Son     MI at age 49     Objective:   No data found.        See MD Preop evaluation      Assessment:   1. History of colon polyps in need of surveillance  colonoscopy 2. Status post wedge resection for squamous cell lung cancer  Plan:   Will proceed with surveillance colonoscopy at this time. The procedure has been discussed in detail with the patient in the office and she has had  in the past.

## 2015-02-13 NOTE — Discharge Instructions (Signed)
Colonoscopy, Care After These instructions give you information on caring for yourself after your procedure. Your doctor may also give you more specific instructions. Call your doctor if you have any problems or questions after your procedure. HOME CARE  Do not drive for 24 hours.  Do not sign important papers or use machinery for 24 hours.  You may shower.  You may go back to your usual activities, but go slower for the first 24 hours.  Take rest breaks often during the first 24 hours.  Walk around or use warm packs on your belly (abdomen) if you have belly cramping or gas.  Drink enough fluids to keep your pee (urine) clear or pale yellow.  Resume your normal diet. Avoid heavy or fried foods.  Avoid drinking alcohol for 24 hours or as told by your doctor.  Only take medicines as told by your doctor. If a tissue sample (biopsy) was taken during the procedure:   Do not take aspirin or blood thinners for 7 days, or as told by your doctor.  Do not drink alcohol for 7 days, or as told by your doctor.  Eat soft foods for the first 24 hours. GET HELP IF: You still have a small amount of blood in your poop (stool) 2-3 days after the procedure. GET HELP RIGHT AWAY IF:  You have more than a small amount of blood in your poop.  You see clumps of tissue (blood clots) in your poop.  Your belly is puffy (swollen).  You feel sick to your stomach (nauseous) or throw up (vomit).  You have a fever.  You have belly pain that gets worse and medicine does not help. MAKE SURE YOU:  Understand these instructions.  Will watch your condition.  Will get help right away if you are not doing well or get worse.   This information is not intended to replace advice given to you by your health care provider. Make sure you discuss any questions you have with your health care provider.   Document Released: 01/25/2010 Document Revised: 12/28/2012 Document Reviewed: 08/30/2012 Elsevier  Interactive Patient Education [employer].  No repeat colon due to age. In 5 years begin to check stool yearly for blood.  Return office visit as needed.

## 2015-02-14 ENCOUNTER — Encounter (HOSPITAL_COMMUNITY): Payer: Self-pay | Admitting: Gastroenterology

## 2015-03-13 DIAGNOSIS — M8580 Other specified disorders of bone density and structure, unspecified site: Secondary | ICD-10-CM | POA: Diagnosis not present

## 2015-03-13 DIAGNOSIS — M0589 Other rheumatoid arthritis with rheumatoid factor of multiple sites: Secondary | ICD-10-CM | POA: Diagnosis not present

## 2015-03-13 DIAGNOSIS — R29898 Other symptoms and signs involving the musculoskeletal system: Secondary | ICD-10-CM | POA: Diagnosis not present

## 2015-03-13 DIAGNOSIS — C3490 Malignant neoplasm of unspecified part of unspecified bronchus or lung: Secondary | ICD-10-CM | POA: Diagnosis not present

## 2015-03-13 DIAGNOSIS — M150 Primary generalized (osteo)arthritis: Secondary | ICD-10-CM | POA: Diagnosis not present

## 2015-03-13 DIAGNOSIS — Z79899 Other long term (current) drug therapy: Secondary | ICD-10-CM | POA: Diagnosis not present

## 2015-03-29 ENCOUNTER — Other Ambulatory Visit: Payer: Self-pay | Admitting: *Deleted

## 2015-03-29 DIAGNOSIS — R911 Solitary pulmonary nodule: Secondary | ICD-10-CM

## 2015-04-04 ENCOUNTER — Telehealth: Payer: Self-pay | Admitting: *Deleted

## 2015-04-04 NOTE — Telephone Encounter (Signed)
  Oncology Nurse Navigator Documentation  Navigator Location: [facility] (04/04/15 1547) Navigator Encounter Type: Telephone (Left voice mail message.) (04/04/15 1547)  I called patient to check in.  I left a voice mail message with contact information and request that she return my call.           Treatment Phase: Follow-up (04/04/15 1547)

## 2015-04-26 ENCOUNTER — Ambulatory Visit (INDEPENDENT_AMBULATORY_CARE_PROVIDER_SITE_OTHER): Payer: PPO | Admitting: Cardiothoracic Surgery

## 2015-04-26 ENCOUNTER — Encounter: Payer: Self-pay | Admitting: Cardiothoracic Surgery

## 2015-04-26 ENCOUNTER — Ambulatory Visit
Admission: RE | Admit: 2015-04-26 | Discharge: 2015-04-26 | Disposition: A | Discharge status: Home / Self Care | Payer: PPO | Source: Ambulatory Visit | Attending: Cardiothoracic Surgery | Admitting: Cardiothoracic Surgery

## 2015-04-26 VITALS — BP 130/86 | HR 90 | Resp 20 | Ht 62.0 in | Wt 173.0 lb

## 2015-04-26 DIAGNOSIS — R911 Solitary pulmonary nodule: Secondary | ICD-10-CM

## 2015-04-26 DIAGNOSIS — Z8541 Personal history of malignant neoplasm of cervix uteri: Secondary | ICD-10-CM

## 2015-04-26 DIAGNOSIS — J984 Other disorders of lung: Secondary | ICD-10-CM | POA: Diagnosis not present

## 2015-04-26 DIAGNOSIS — Z85118 Personal history of other malignant neoplasm of bronchus and lung: Secondary | ICD-10-CM | POA: Diagnosis not present

## 2015-04-26 DIAGNOSIS — C3490 Malignant neoplasm of unspecified part of unspecified bronchus or lung: Secondary | ICD-10-CM | POA: Diagnosis not present

## 2015-04-26 NOTE — Progress Notes (Signed)
[address]       [place] [address]             [id number]      Taylor Berry [facility] Record [id number] Date of Birth: 29-Nov-1940  Referring: Collene Gobble, MD Primary Care: Osborne Casco, MD  Chief Complaint:   POST OP FOLLOW UP 04/12/2013  PREOPERATIVE DIAGNOSIS: Squamous cell carcinoma of the left upper lobe  with limited pulmonary reserve.  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  SURGICAL PROCEDURE: Video bronchoscopy, left video-assisted  thoracoscopy, mini-thoracotomy, wedge resection of left upper lobe with  lymph node dissection, and biopsy of chest wall.  SURGEON: Lanelle Bal, MD  Lung cancer, left upper lobe- Squamous cell    Primary site: Lung (Left)   Staging method: AJCC 7th Edition   Pathologic: Stage IB (T2a, N0, cM0) signed by [name], MD on 04/14/2013  8:31 AM   Summary: Stage IB (T2a, N0, cM0)  History of Present Illness:     Patient returns to the office today with followup CT scan 6 months following  wedge resection of left upper lobe for squamous cell carcinoma stage IB on 04/12/2013.  During her hospital course she developed Klebsiella in her sputum, Escherichia coli in her urine, Pseudomonas at the left chest incision site. She was initially discharged home to SNF .She comes to the office today 24 months  Postop  She continues to do much better with her respiratory status, her main complaints revolve around her rheumatoid arthritis and various side effects from medication. She is convinced that the surgical resection of her lung cancer is markedly improved her respiratory status as she is much less breathless than she was previously. I reminded her that weight loss is probably the biggest factor in her overall improvement in respiratory status. She now goes to exercise class III days a week , No longer uses oxygen at home or  inhalers.    Zubrod Score: At the time of surgery this patient's most appropriate activity status/level should be described as: '[]'$     0    Normal activity, no symptoms '[x]'$     1    Restricted in physical strenuous activity but ambulatory, able to do out light work '[]'$     2    Ambulatory and capable of self care, unable to do work activities, up and about >50 % of waking hours                              '[]'$     3    Only limited self care, in bed greater than 50% of waking hours '[]'$     4    Completely disabled, no self care, confined to bed or chair '[]'$     5    Moribund    Past Medical History  Diagnosis Date  . Hypertension   . Osteoporosis   . Allergic rhinitis   . Hyperlipidemia   . Obesity   . COPD (chronic obstructive pulmonary disease) (HCC)     cervical cancer  . Shortness of breath   . Cough with expectoration     coughs up phlegm early morning  . GERD (gastroesophageal reflux disease)   . Left bundle branch block   . OSA (obstructive sleep apnea)     denies.  . Claustrophobia     Needs  pillow beneath to lay flat-"gets choking feeling oherwise"  . Uterine cancer ([place])   . Lung cancer ([place])     left upper lobe wedge removed-no further tx.  . Rheumatoid arthritis(714.0)     Dr. Stark Jock follows  . Osteoarthritis   . Complication of anesthesia     Fentanyl allergy, "claustrophobia"     History  Smoking status  . Former Smoker -- 1.00 packs/day for 35 years  . Types: Cigarettes  . Quit date: 01/07/1988  Smokeless tobacco  . Never Used    History  Alcohol Use No    Comment: not since 1988     Allergies  Allergen Reactions  . Latex     REACTION: itching  . Ace Inhibitors     REACTION: cough  . Fentanyl     Hallucination "I go crazy"   . Hydrochlorothiazide     REACTION: decreased sodium  . Penicillins     REACTION: intolerant due to yeast infection Has patient had a PCN reaction causing immediate rash, facial/tongue/throat swelling, SOB or lightheadedness  with hypotension:No Has patient had a PCN reaction causing severe rash involving mucus membranes or skin necrosis: No Has patient had a PCN reaction that required hospitalization No Has patient had a PCN reaction occurring within the last 10 years: Yes If all of the above answers are "NO", then may proceed with Cephalosporin use.     Current Outpatient Prescriptions  Medication Sig Dispense Refill  . aspirin EC 81 MG tablet Take 81 mg by mouth every morning.     . calcium-vitamin D (OSCAL WITH D) 500-200 MG-UNIT per tablet Take 1 tablet by mouth every other day.    . cholecalciferol (VITAMIN D) 1000 UNITS tablet Take 1,000 Units by mouth every morning.     . fluticasone (FLONASE) 50 MCG/ACT nasal spray PLACE 2 SPRAYS INTO THE NOSE TWICE DAILY AS NEEDED. (Patient taking differently: PLACE 2 SPRAYS INTO THE NOSE TWICE DAILY AS NEEDED FOR ALLERGIES.) 16 g 1  . hydroxychloroquine (PLAQUENIL) 200 MG tablet Take by mouth 2 (two) times daily.    Marland Kitchen leflunomide (ARAVA) 20 MG tablet Take 20 mg by mouth every morning.     . Multiple Vitamins-Minerals (HAIR SKIN AND NAILS FORMULA PO) Take 1 tablet by mouth every morning.    . Omega-3 Fatty Acids (FISH OIL) 600 MG CAPS Take 600 mg by mouth every other day.     Marland Kitchen OVER THE COUNTER MEDICATION     . simvastatin (ZOCOR) 20 MG tablet Take 20 mg by mouth daily at 6 PM.   3  . sulfaSALAzine (AZULFIDINE) 500 MG tablet Take 500 mg by mouth 2 (two) times daily.    Marland Kitchen venlafaxine (EFFEXOR-XR) 75 MG 24 hr capsule Take 75 mg by mouth every morning.     . vitamin C (ASCORBIC ACID) 500 MG tablet Take 500 mg by mouth every morning.      No current facility-administered medications for this visit.       Physical Exam: BP 130/86 mmHg  Pulse 90  Resp 20  Ht '5\' 2"'$  (1.575 m)  Wt 173 lb (78.472 kg)  BMI 31.63 kg/m2  SpO2 95%  General appearance: alert, cooperative and appears older than stated age Neurologic: intact Heart: regular rate and rhythm, S1, S2 normal,  no murmur, click, rub or gallop Lungs: diminished breath sounds bibasilar Abdomen: soft, non-tender; bowel sounds normal; no masses,  no organomegaly Extremities: edema Mild ankle edema Wound: Her left chest port sites and  incision are well-healed without evidence of drainage erythema or infection On palpation of the left chest wall there is no crepitance noted  Diagnostic Studies & Laboratory data:     Recent Radiology Findings:  Ct Chest Wo Contrast  04/26/2015  CLINICAL DATA:  Lung cancer with left upper lobe resection. Uterine cancer with hysterectomy. Ex-smoker. Follow-up lung nodule. EXAM: CT CHEST WITHOUT CONTRAST TECHNIQUE: Multidetector CT imaging of the chest was performed following the standard protocol without IV contrast. COMPARISON:  11/02/2014 FINDINGS: Mediastinum/Nodes: Aortic and branch vessel atherosclerosis. Mild cardiomegaly. Multivessel coronary artery atherosclerosis. Pulmonary artery enlargement, with the main pulmonary arteries measuring 2.9 and 3.1 cm respectively. 7 mm precarinal node is unchanged and not pathologic by size criteria. Hilar regions poorly evaluated without intravenous contrast. Lungs/Pleura: No pleural fluid. Lower lobe predominant bronchial wall thickening. Mild centrilobular emphysema. 2 mm right apical pulmonary nodule is unchanged on image 19/series 4. Right middle lobe volume loss and mild bronchiectasis. Similar. Bibasilar bronchial wall thickening is moderate and likely post infectious or inflammatory. Posterior left upper lobe wedge resection. Similar appearance of for trace residual loculated pleural air. Example image 52/series 4. Nodularity along the surgical sutures is resolved. Similar minimal thickening of the left major fissure image 41/series 4. Upper abdomen: Left hepatic lobe subcentimeter cyst. Probable gallstones. Normal imaged portions of the spleen, stomach, pancreas, adrenal glands, kidneys. Musculoskeletal: Moderate thoracic spondylosis  IMPRESSION: 1. Status post left upper lobe wedge resection, without recurrent or metastatic disease. 2. Decrease in nodularity along the surgical sutures. Persistent loculated trace left pleural air, postoperative. 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 3.  Atherosclerosis, including within the coronary arteries. 4. Pulmonary artery enlargement suggests pulmonary arterial hypertension. Electronically Signed   By: Abigail Miyamoto M.D.   On: 04/26/2015 14:14   Ct Chest Wo Contrast  11/02/2014  CLINICAL DATA:  Lung cancer status post left upper lobe wedge resection in 04/2013. History of uterine cancer status post hysterectomy in 1971. EXAM: CT CHEST WITHOUT CONTRAST TECHNIQUE: Multidetector CT imaging of the chest was performed following the standard protocol without IV contrast. COMPARISON:  04/13/2014 FINDINGS: Mediastinum/Nodes: The heart is normal in size. No pericardial effusion. Coronary atherosclerosis. Atherosclerotic calcifications of the aortic arch. Small mediastinal lymph nodes measuring up to 7 mm short axis (series 3/ image 25), unchanged. Visualized left thyroid is mildly enlarged/heterogeneous. Lungs/Pleura: Postsurgical changes related to posterior left upper lobe wedge resection (series 4/image 28). Minimal nodularity along the left fissure (series 4/ image 22), postsurgical. 2-3 mm subpleural nodule in the lateral right lung apex (series 4/image 11, unchanged since 2015, benign. No suspicious pulmonary nodules. Mild scarring with bronchiectasis in the right middle lobe (series 4/image 40). Mild bronchial wall thickening with subpleural reticulation/ fibrosis in the bilateral lower lobes. No focal consolidation. Underlying mild paraseptal emphysematous changes. No pleural effusion or pneumothorax. Upper abdomen: Visualized upper abdomen is notable for a 10 mm left adrenal adenoma. Musculoskeletal: Degenerative changes of the visualized thoracolumbar spine. IMPRESSION: Postsurgical changes  related to left upper lobe wedge resection. No evidence of recurrent or metastatic disease in the chest. Electronically Signed   By: Julian Hy M.D.   On: 11/02/2014 15:14   Ct Chest Wo Contrast  04/13/2014   CLINICAL DATA:  75 year old female with left upper lobe lung cancer status post resection 6 months ago. Tenderness at the surgical site.  EXAM: CT CHEST WITHOUT CONTRAST  TECHNIQUE: Multidetector CT imaging of the chest was performed following the standard protocol without IV contrast.  COMPARISON:  Chest  CT 10/06/2013.  FINDINGS: Mediastinum/Lymph Nodes: Heart size is normal. There is no significant pericardial fluid, thickening or pericardial calcification. There is atherosclerosis of the thoracic aorta, the great vessels of the mediastinum and the coronary arteries, including calcified atherosclerotic plaque in the left main, left anterior descending and right coronary arteries. No pathologically enlarged mediastinal or hilar lymph nodes. Please note that accurate exclusion of hilar adenopathy is limited on noncontrast CT scans. Esophagus is unremarkable in appearance. No axillary lymphadenopathy.  Lungs/Pleura: Postoperative changes of wedge resection are again noted in the lateral aspect of the left upper lobe. Again noted is a large peripheral postoperative pneumatocele in this region, which has decreased significantly in size compared to the prior study. A few scattered 2-3 mm micronodules are noted throughout the lungs bilaterally, peribronchovascular in distribution, favored to represent areas of chronic mucoid impaction within terminal bronchioles, similar to prior studies. No larger more suspicious appearing pulmonary nodules or masses are noted. Mild diffuse bronchial wall thickening, with some scattered areas of cylindrical and mild varicose bronchiectasis, most evident in the lower lobes of the lungs bilaterally. No acute consolidative airspace disease. No pleural effusions. There  continues to be a small amount of herniation have the left lower lobe through the fifth intercostal space laterally, at the site of thoracotomy. Trace amount of residual gas in the left serratus musculature, significantly decreased compared to the prior examination.  Upper Abdomen: Atherosclerosis.  Musculoskeletal/Soft Tissues: Postthoracotomy changes in the left ribs. There are no aggressive appearing lytic or blastic lesions noted in the visualized portions of the skeleton.  IMPRESSION: 1. Status post left upper lobe wedge resection, with no findings to suggest local recurrence of disease. Decreasing size of postoperative the mass steel in the left upper lobe, and decreasing herniation through the left fifth intercostal space, as discussed above. 2. Diffuse bronchial wall thickening with areas of cylindrical and mild varicose bronchiectasis, most evident in the lower lobes of the lungs bilaterally. These findings are similar to recent prior examinations. 3. Atherosclerosis, including left main and 2 vessel coronary artery disease. Assessment for potential risk factor modification, dietary therapy or pharmacologic therapy may be warranted, if clinically indicated. 4. Additional incidental findings, as above.   Electronically Signed   By: Vinnie Langton M.D.   On: 04/13/2014 13:24    Recent Lab Findings: Lab Results  Component Value Date   WBC 10.5 04/27/2013   HGB 9.4* 04/27/2013   HCT 29.3* 04/27/2013   PLT 274 04/27/2013   GLUCOSE 117* 04/27/2013   ALT 7 04/14/2013   AST 19 04/14/2013   NA 135* 04/27/2013   K 4.5 04/27/2013   CL 98 04/27/2013   CREATININE 0.49* 04/27/2013   BUN 11 04/27/2013   CO2 24 04/27/2013   INR 0.91 04/11/2013   Wt Readings from Last 3 Encounters:  04/26/15 173 lb (78.472 kg)  11/02/14 170 lb (77.111 kg)  05/16/14 173 lb (78.472 kg)     Assessment / Plan:   Patient is now exactly 24  months months   following wedge resection of stage IB squamous cell  carcinoma of the left upper lobe without evidence of recurrence on CT scan  Plan to see her back with a follow-up CT scan in one year Overall she is much improved from her respiratory status likely secondary to her 50 pound weight loss that was intentional.       [name] MD      [address] [place] [address] Office  [phone]   Beeper [id number]  04/26/2015 2:19 PM

## 2015-04-27 DIAGNOSIS — N399 Disorder of urinary system, unspecified: Secondary | ICD-10-CM | POA: Diagnosis not present

## 2015-04-27 DIAGNOSIS — R232 Flushing: Secondary | ICD-10-CM | POA: Diagnosis not present

## 2015-04-27 DIAGNOSIS — R61 Generalized hyperhidrosis: Secondary | ICD-10-CM | POA: Diagnosis not present

## 2015-05-08 ENCOUNTER — Encounter: Payer: Self-pay | Admitting: Podiatry

## 2015-05-08 ENCOUNTER — Ambulatory Visit (INDEPENDENT_AMBULATORY_CARE_PROVIDER_SITE_OTHER): Payer: PPO | Admitting: Podiatry

## 2015-05-08 DIAGNOSIS — B351 Tinea unguium: Secondary | ICD-10-CM

## 2015-05-08 DIAGNOSIS — M79676 Pain in unspecified toe(s): Secondary | ICD-10-CM | POA: Diagnosis not present

## 2015-05-08 NOTE — Progress Notes (Signed)
Patient ID: Taylor Berry, female   DOB: 1940/08/01, 75 y.o.   MRN: [id number] Complaint:  Visit Type: Patient returns to my office for continued preventative foot care services. Complaint: Patient states" my nails have grown long and thick and become painful to walk and wear shoes".She presents for preventative foot care services. No changes to ROS  Podiatric Exam: Vascular: dorsalis pedis and posterior tibial pulses are palpable bilateral. Capillary return is immediate. Temperature gradient is WNL. Skin turgor WNL  Sensorium: Normal Semmes Weinstein monofilament test. Normal tactile sensation bilaterally. Nail Exam: Pt has thick disfigured discolored nails with subungual debris noted bilateral entire nail hallux through fifth toenails Ulcer Exam: There is no evidence of ulcer or pre-ulcerative changes or infection. Orthopedic Exam: Muscle tone and strength are WNL. No limitations in general ROM. No crepitus or effusions noted. Foot type and digits show no abnormalities. Bony prominences are unremarkable. Skin: No Porokeratosis. No infection or ulcers  Diagnosis:  Tinea unguium, Pain in right toe, pain in left toes  Treatment & Plan Procedures and Treatment: Consent by patient was obtained for treatment procedures. The patient understood the discussion of treatment and procedures well. All questions were answered thoroughly reviewed. Debridement of mycotic and hypertrophic toenails, 1 through 5 bilateral and clearing of subungual debris. No ulceration, no infection noted. Patient desires superficial spreading fungus be debrided.  Return Visit-Office Procedure: Patient instructed to return to the office for a follow up visit 3 months for continued evaluation and treatment.   Gardiner Barefoot DPM

## 2015-05-16 DIAGNOSIS — M150 Primary generalized (osteo)arthritis: Secondary | ICD-10-CM | POA: Diagnosis not present

## 2015-05-16 DIAGNOSIS — R232 Flushing: Secondary | ICD-10-CM | POA: Diagnosis not present

## 2015-05-16 DIAGNOSIS — C3490 Malignant neoplasm of unspecified part of unspecified bronchus or lung: Secondary | ICD-10-CM | POA: Diagnosis not present

## 2015-05-16 DIAGNOSIS — M0589 Other rheumatoid arthritis with rheumatoid factor of multiple sites: Secondary | ICD-10-CM | POA: Diagnosis not present

## 2015-05-16 DIAGNOSIS — M8580 Other specified disorders of bone density and structure, unspecified site: Secondary | ICD-10-CM | POA: Diagnosis not present

## 2015-05-16 DIAGNOSIS — Z79899 Other long term (current) drug therapy: Secondary | ICD-10-CM | POA: Diagnosis not present

## 2015-05-16 DIAGNOSIS — R29898 Other symptoms and signs involving the musculoskeletal system: Secondary | ICD-10-CM | POA: Diagnosis not present

## 2015-05-22 ENCOUNTER — Encounter: Payer: Self-pay | Admitting: Emergency Medicine

## 2015-05-22 ENCOUNTER — Ambulatory Visit (INDEPENDENT_AMBULATORY_CARE_PROVIDER_SITE_OTHER): Payer: PPO | Admitting: Emergency Medicine

## 2015-05-22 VITALS — BP 132/68 | HR 65 | Ht 62.0 in | Wt 183.0 lb

## 2015-05-22 DIAGNOSIS — J449 Chronic obstructive pulmonary disease, unspecified: Secondary | ICD-10-CM | POA: Diagnosis not present

## 2015-05-22 DIAGNOSIS — C3412 Malignant neoplasm of upper lobe, left bronchus or lung: Secondary | ICD-10-CM | POA: Diagnosis not present

## 2015-05-22 DIAGNOSIS — G4733 Obstructive sleep apnea (adult) (pediatric): Secondary | ICD-10-CM | POA: Diagnosis not present

## 2015-05-22 NOTE — Assessment & Plan Note (Signed)
Last Ct scan April 2017 was stable, reassuring.

## 2015-05-22 NOTE — Progress Notes (Signed)
2011 >> 75 yo woman, former smoker, with obesity, HTN, RA, allergic rhinitis, OSA dx by PSG not currently on CPAP due to intolerance of high pressure. Referred for evaluation for chronic cough.   Has been seen for chronic cough by Dr Lady Deutscher for cough over the last 2 yrs. Prior to that she did not cough frequently, only with URIs. She reports persistent cough for about 2 yrs, usuallly non-prod but sometimes white to clear, appears to be worse during the winter months. Has also had PND and allergies that have remained bothersome. Was tried on zyrtec, also on Advair, QVAR at one point, not on these currently. Was treated for the last 2 months empirically for GERD with lansoprasole, off it now. She sometimes wakes at night with cough, PND. No real voice changes, some globus sensation and throat clearing.    ROV 06/02/13 --  Hx of RA on immunosuppression, OSA, mild AFL with marginal BD response, diastolic dysfxn, borderline secondary PAH. She is new ly dx with squamous cell lung CA post-resection by Dr Servando Snare. She was treated with IV abx post op for pseudomonal chest wound infxn. She has completed the abx, PICC is out. She is building up her strength, working with PT.  She is on Spiriva, albuterol prn - hasn't required. Uses zyrtec. She has noted desaturations after walking about 284f.   ROV 08/31/13 -- follow up visit for COPD and (untreated) OSA. She is on MTX for RA, is off prednisone. Diastolic dysfxn and recently treated stage 1 squamous cell lung CA. She has some residual pleural air that may be contiguous with air outside the chest wall > being followed by [facility]. She is doing well but is understandably worried about the pneumatocele. BD's are spiriva + prn SABA.   ROV 12/22/13 -- history of COPD and (untreated) OSA. She is on MTX for RA, is off prednisone. Diastolic dysfxn and recently treated stage 1 squamous cell lung CA. Here for regular f/u visit. She has begun to have some URI type sx. Some  drainage and sore throat, non-prod cough. No N/V. May have had a sick contact from her granddaughter. She has lost wt, has otherwise been doing well. She is on spiriva + SABA.  Not using flonase right now.   ROV 05/16/14 -- follow-up visit for COPD, untreated OSA, rheumatoid arthritis on methotrexate. She is also been treated for stage I squamous cell lung cancer. She has been working hard on diet and exercise > has lost about 20 lbs since last time. Her breathing is better, has much less cough. Unclear whether she still has OSA. Her last Ct scan 04/13/14 did not show any evidence recurrence. She does have some bronchial wall inflammation and bronchiectasis. She is off all of her scheduled BD's > does not believe she misses them !!  ROV 05/22/15 -- patient with a history of COPD, rheumatoid arthritis previously treated with methotrexate (off for years), stage Ib squamous cell lung cancer status post wedge resection. She also has obstructive sleep apnea, untreated. She has lost about 60+ lbs over the last 2 years, ? Whether she still has OSA. She has been on BD before but not really sure that these were helpful. She is sleeping well, is not coughing. She has improved stamina. Still with some exertional dyspnea if she pushes herself. Her PFT from 2015 have significant obstruction with a positive BD response.     EXAM:Danley DankerVitals:   05/22/15 0913 05/22/15 0914  BP:  132/68  Pulse:  65  Height: 5' 2"$  (1.575 m)   Weight: 183 lb (83.008 kg)   SpO2:  96%   Gen: Pleasant, obese, in no distress,  normal affect, in wheelchair   ENT: No lesions,  mouth clear,  oropharynx clear, no postnasal drip  Neck: No JVD, no TMG, no carotid bruits  Lungs: No use of accessory muscles, diminished BS in bases. Few L crackles. No palpable subcutaneous air pocket.  Along left chest wall healed surgical scars.   Cardiovascular: RRR, heart sounds normal, no murmur or gallops, tr-1+  peripheral edema  Musculoskeletal: No  deformities, no cyanosis or clubbing  Neuro: alert, non focal  Skin: Warm, no lesions or rashes  04/26/15 --  COMPARISON: 11/02/2014  FINDINGS: Mediastinum/Nodes: Aortic and branch vessel atherosclerosis. Mild cardiomegaly. Multivessel coronary artery atherosclerosis. Pulmonary artery enlargement, with the main pulmonary arteries measuring 2.9 and 3.1 cm respectively.  7 mm precarinal node is unchanged and not pathologic by size criteria. Hilar regions poorly evaluated without intravenous contrast.  Lungs/Pleura: No pleural fluid. Lower lobe predominant bronchial wall thickening. Mild centrilobular emphysema.  2 mm right apical pulmonary nodule is unchanged on image 19/series 4.  Right middle lobe volume loss and mild bronchiectasis. Similar. Bibasilar bronchial wall thickening is moderate and likely post infectious or inflammatory. Posterior left upper lobe wedge resection. Similar appearance of for trace residual loculated pleural air. Example image 52/series 4. Nodularity along the surgical sutures is resolved.  Similar minimal thickening of the left major fissure image 41/series 4.  Upper abdomen: Left hepatic lobe subcentimeter cyst. Probable gallstones. Normal imaged portions of the spleen, stomach, pancreas, adrenal glands, kidneys.  Musculoskeletal: Moderate thoracic spondylosis  IMPRESSION: 1. Status post left upper lobe wedge resection, without recurrent or metastatic disease. 2. Decrease in nodularity along the surgical sutures. Persistent loculated trace left pleural air, postoperative. 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 3. Atherosclerosis, including within the coronary arteries. 4. Pulmonary artery enlargement suggests pulmonary arterial hypertension    COPD (chronic obstructive pulmonary disease) Based on her PFT, most recent 2015, with a positive BD response. She has an improved exercise tolerance since her wt loss. She will  likely need repeat PFT at some point to establish her post-surgical baseline. I also believe that we will retry scheduled BD;s at some point to see if she benefits. She is not ready to do this yet. We will follow up to reassess.   Lung cancer, left upper lobe Last Ct scan April 2017 was stable, reassuring.   OSA (obstructive sleep apnea) Unclear whether this is still clinically significant after her wt loss. She is uninterested in a repeat PSG, is unable to tolerate CPAP in any event.

## 2015-05-22 NOTE — Addendum Note (Signed)
Addended by: Collene Gobble on: 05/22/2015 10:04 AM   Modules accepted: SmartSet

## 2015-05-22 NOTE — Patient Instructions (Signed)
We will not start any new inhaled medications at this time.  Follow with Dr Lamonte Sakai in 12 months or sooner if you have any problems

## 2015-05-22 NOTE — Assessment & Plan Note (Signed)
Unclear whether this is still clinically significant after her wt loss. She is uninterested in a repeat PSG, is unable to tolerate CPAP in any event.

## 2015-05-22 NOTE — Assessment & Plan Note (Signed)
Based on her PFT, most recent 2015, with a positive BD response. She has an improved exercise tolerance since her wt loss. She will likely need repeat PFT at some point to establish her post-surgical baseline. I also believe that we will retry scheduled BD;s at some point to see if she benefits. She is not ready to do this yet. We will follow up to reassess.

## 2015-05-31 IMAGING — CT CT CHEST W/O CM
3 of 4 series · 17 of 30 positions shown, 19 images · non-contrast
Comparison: 02/01/2013

CLINICAL DATA: Followup left lung carcinoma. 8months status post
surgical resection.

EXAM:
CT CHEST WITHOUT CONTRAST
TECHNIQUE: Multidetector CT imaging of the chest was performed following the
standard protocol without IV contrast..

[Series 3: chest w/o · axial · non-contrast · 0.70mm/px · z∈[-306,-110]mm · 4 of 66 slices shown]
[im 14/66  lung]
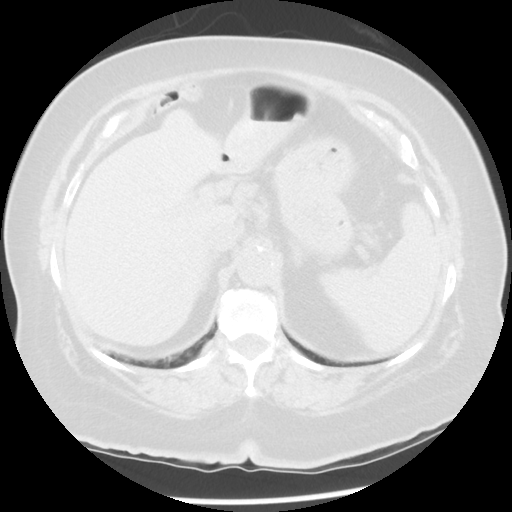
[im 27/66  lung]
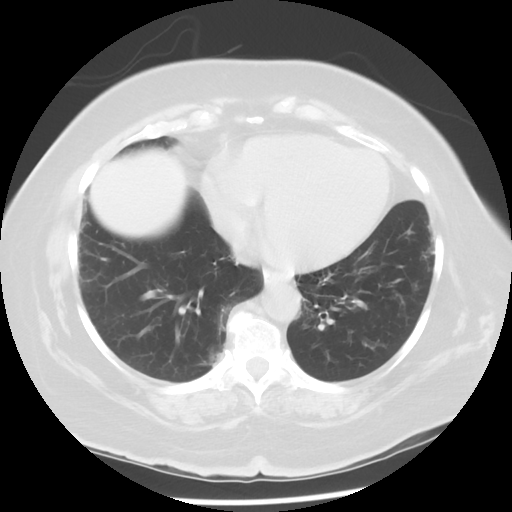
[im 40/66  lung]
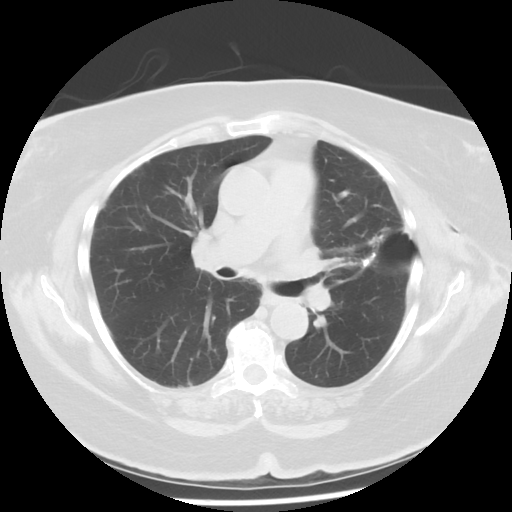
[im 53/66  lung]
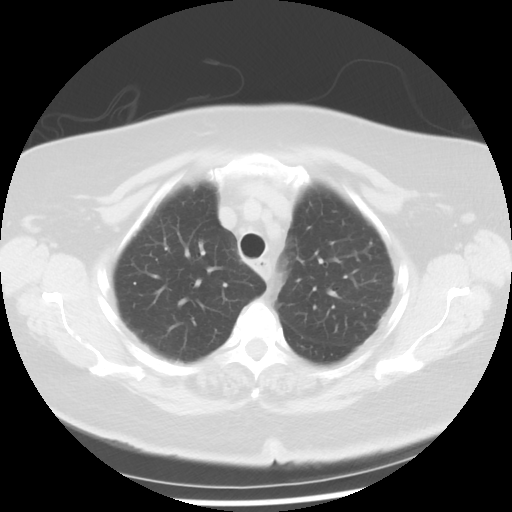

[Series 4: lung windows · axial · 0.70mm/px · z∈[-316,-96]mm · 5 of 68 slices shown, 7 images]
[im 12/68  mediastinal]
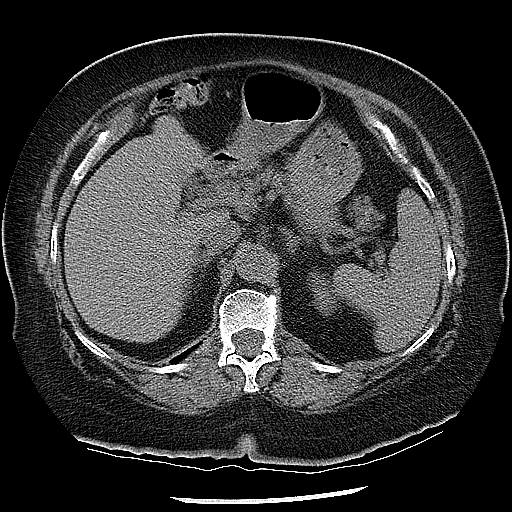
[im 12/68  lung]
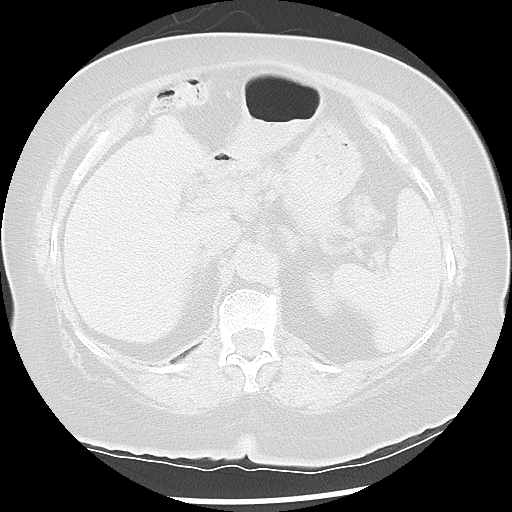
[im 23/68  lung]
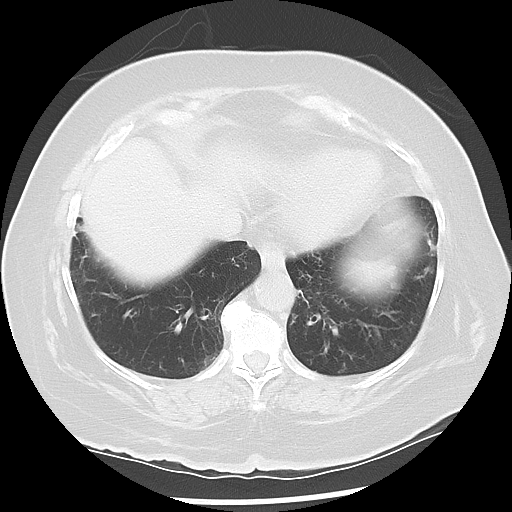
[im 34/68  lung]
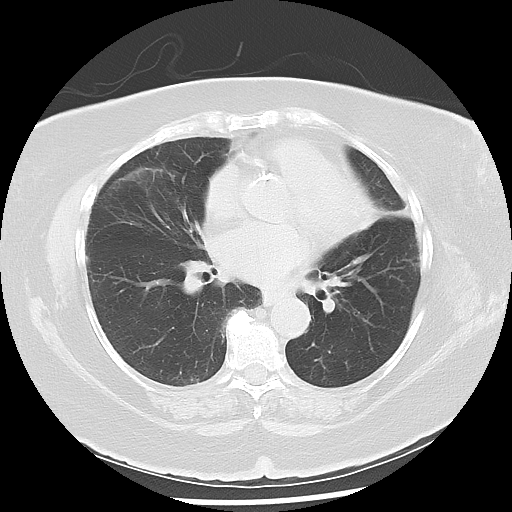
[im 45/68  lung]
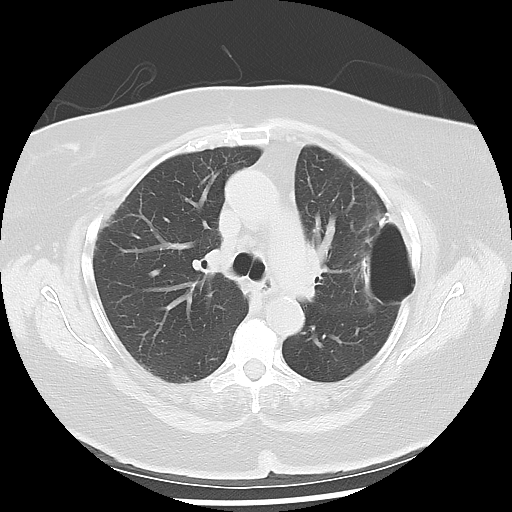
[im 56/68  mediastinal]
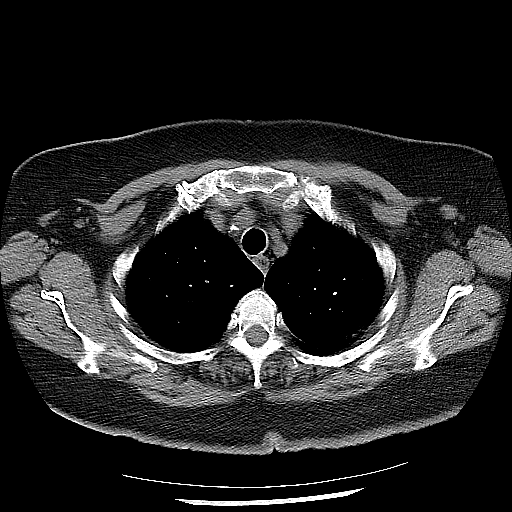
[im 56/68  lung]
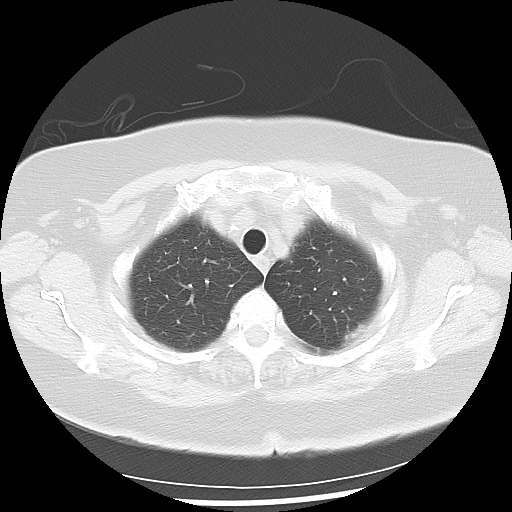

[Series 602: sagittal body · sagittal · 0.70mm/px · 8 of 145 slices shown]
[im 11/145  mediastinal]
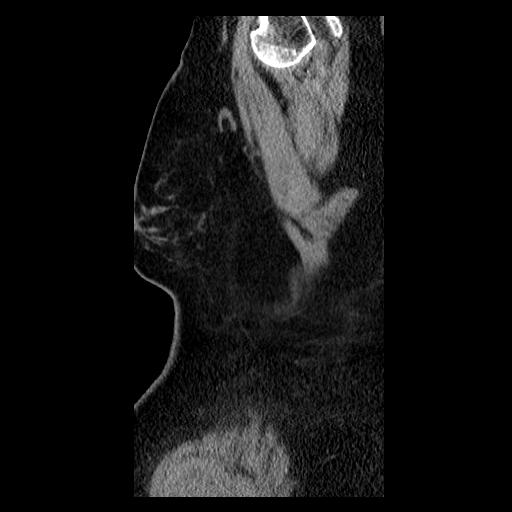
[im 31/145  mediastinal]
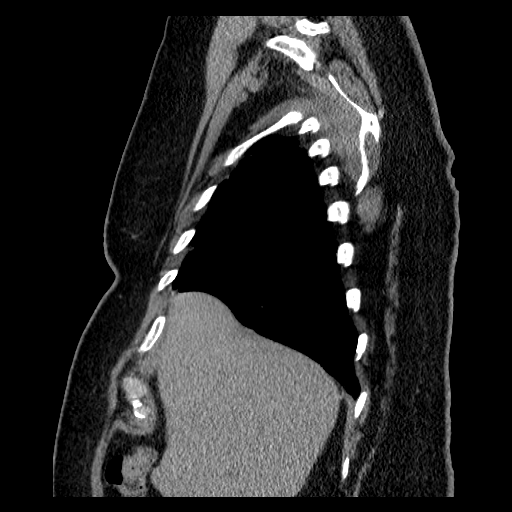
[im 52/145  mediastinal]
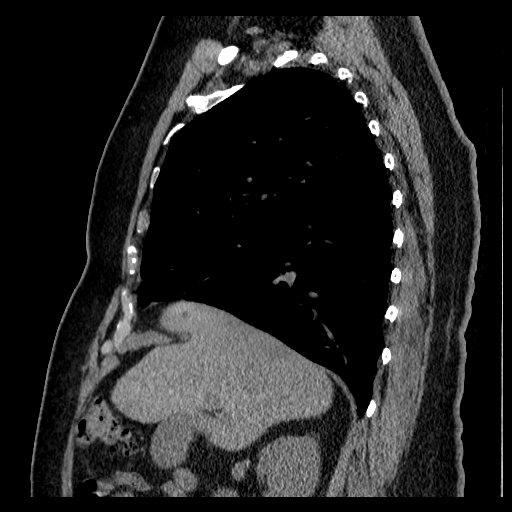
[im 62/145  mediastinal]
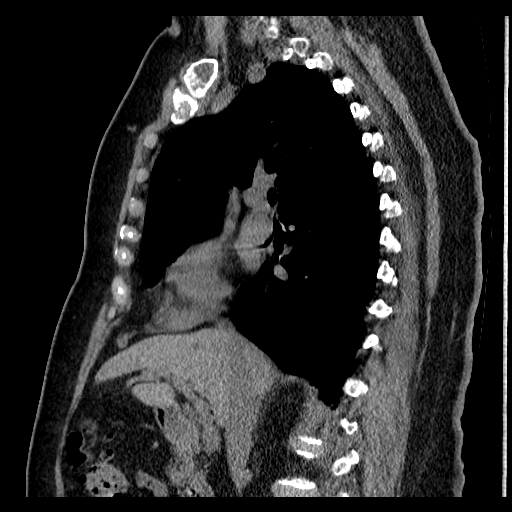
[im 83/145  mediastinal]
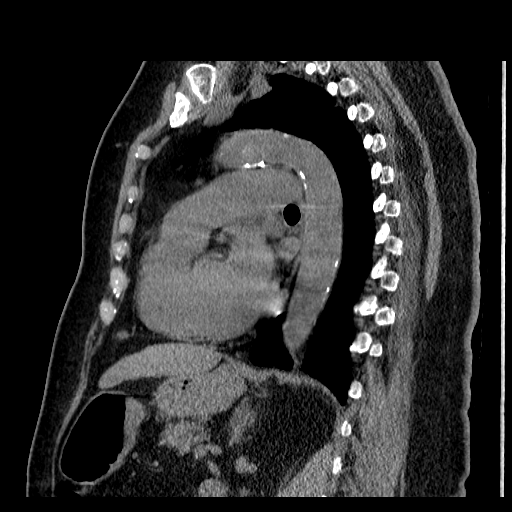
[im 93/145  mediastinal]
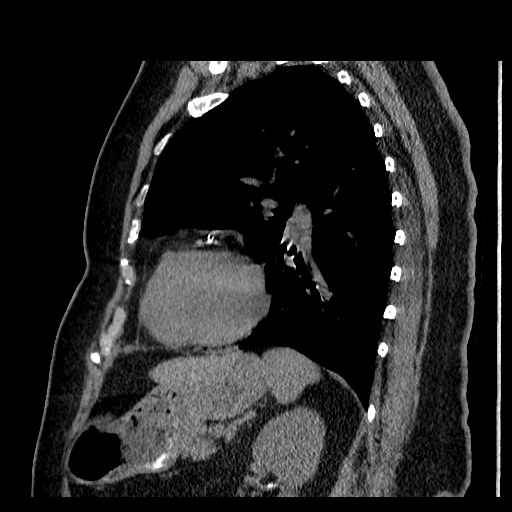
[im 114/145  mediastinal]
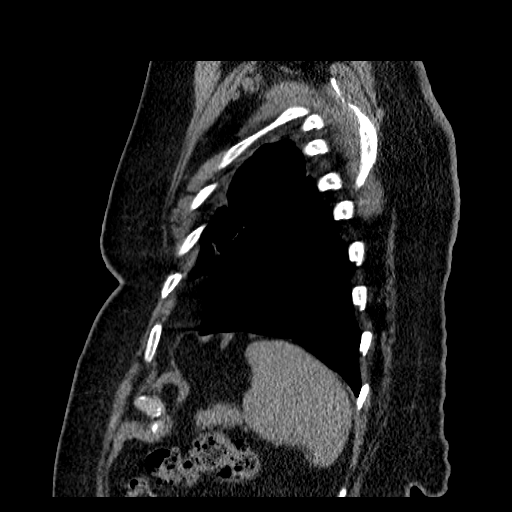
[im 134/145  mediastinal]
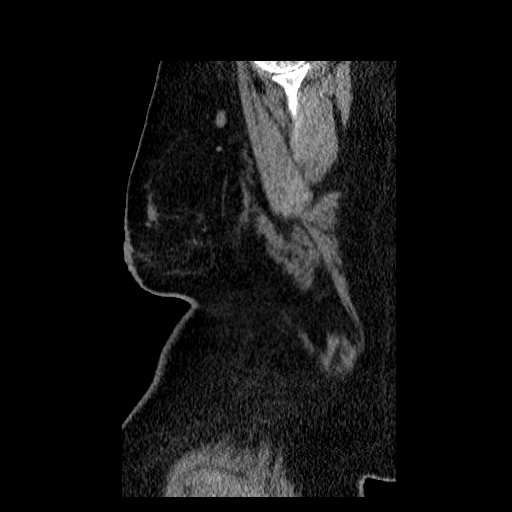

[17 of 30 positions shown; findings below may reference images not displayed]

FINDINGS: Mediastinum/Hilar Regions: No masses or pathologically enlarged
lymph nodes identified.

Other Thoracic Lymphadenopathy:  None.

Lungs: Postop changes are noted in the posterior left upper lobe. A
peripheral pneumatocele is seen which shows intercostal herniation
at the surgical site. No pneumothorax identified. No suspicious
pulmonary nodules or masses are identified. Mild bilateral lower
lobe bronchiectasis noted, left side greater than right.

Pleura:  No evidence of effusion or mass.

Vascular/Cardiac: No thoracic aortic aneurysm or other significant
abnormality identified.

Musculoskeletal:  No suspicious bone lesions identified.

Other:  None.
IMPRESSION: Postop changes in left upper lobe, with incidentally noted
peripheral pneumatocele showing small intercostal herniation at the
thoracotomy site.

No evidence of residual, recurrent, or metastatic carcinoma in the
thorax.

## 2015-06-05 ENCOUNTER — Encounter: Payer: Self-pay | Admitting: Endocrinology

## 2015-06-05 ENCOUNTER — Ambulatory Visit (INDEPENDENT_AMBULATORY_CARE_PROVIDER_SITE_OTHER): Payer: PPO | Admitting: Endocrinology

## 2015-06-05 VITALS — BP 138/80 | HR 82 | Temp 97.3°F | Ht 62.0 in | Wt 180.0 lb

## 2015-06-05 DIAGNOSIS — R61 Generalized hyperhidrosis: Secondary | ICD-10-CM | POA: Insufficient documentation

## 2015-06-05 NOTE — Patient Instructions (Addendum)
A 24-HR urine test is requested for you today.  We'll let you know about the results.   If the results are normal and the symptoms continue, we should repeat this test.  Just let us know.   Also, here is a blood sugar meter, yours to keep.  Please check if you have symptoms, and please let us know what you get.   I would be happy to see you back here as necessary.

## 2015-06-05 NOTE — Progress Notes (Signed)
Subjective:    Patient ID: Taylor Berry, female    DOB: 12-24-1940, 75 y.o.   MRN: [id number]  HPI Pt states few months of moderately excessive diaphoresis throughout the body, and assoc frequent BM's.  She is unable to cite precip factor, except for heat.  Within the past few weeks, she stopped taking losartan after 10 days, due to lightheadedness.  She has h/o migraine many years ago, but not recently.  She has had no alcohol intake since 1986.  No drug use.  No h/o hypoglycemia.  She had menopause at the usual age.  sxs has persisted despite discontinuation of effexor.   Past Medical History  Diagnosis Date  . Hypertension   . Osteoporosis   . Allergic rhinitis   . Hyperlipidemia   . Obesity   . COPD (chronic obstructive pulmonary disease) (HCC)     cervical cancer  . Shortness of breath   . Cough with expectoration     coughs up phlegm early morning  . GERD (gastroesophageal reflux disease)   . Left bundle branch block   . OSA (obstructive sleep apnea)     denies.  . Claustrophobia     Needs pillow beneath to lay flat-"gets choking feeling oherwise"  . Uterine cancer ([place])   . Lung cancer ([place])     left upper lobe wedge removed-no further tx.  . Rheumatoid arthritis(714.0)     Dr. Stark Jock follows  . Osteoarthritis   . Complication of anesthesia     Fentanyl allergy, "claustrophobia"    Past Surgical History  Procedure Laterality Date  . Vesicovaginal fistula closure w/ tah  1971  . Tonsillectomy    . Colonoscopy w/ biopsies and polypectomy    . Cardiac catheterization  01/27/11    minor non-obs CAD, NL EF, mild pulm HTN  . Video bronchoscopy N/A 04/12/2013    Procedure: VIDEO BRONCHOSCOPY;  Surgeon: [name], MD;  Location: [facility];  Service: Thoracic;  Laterality: N/A;  . Video assisted thoracoscopy (vats)/thorocotomy Left 04/12/2013    Procedure: VIDEO ASSISTED THORACOSCOPY (VATS)/THOROCOTOMY, WITH LEFT UPPER LOBE WEDGE RESECTION, CHEST WALL BIOPSY ;   Surgeon: [name], MD;  Location: [place];  Service: Thoracic;  Laterality: Left;  . Lymph node dissection Left 04/12/2013    Procedure: LYMPH NODE DISSECTION;  Surgeon: [name], MD;  Location: [place];  Service: Thoracic;  Laterality: Left;  . Cataract extraction, bilateral      last done 12'16-  . Abdominal hysterectomy      in situ carcinoma  . Colonoscopy with propofol N/A 02/13/2015    Procedure: COLONOSCOPY WITH PROPOFOL;  Surgeon: Laurence Spates, MD;  Location: [facility];  Service: Endoscopy;  Laterality: N/A;    Social History   Social History  . Marital Status: Divorced    Spouse Name: N/A  . Number of Children: 2  . Years of Education: N/A   Occupational History  . counselor   .     Social History Main Topics  . Smoking status: Former Smoker -- 1.00 packs/day for 35 years    Types: Cigarettes    Quit date: 01/07/1988  . Smokeless tobacco: Never Used  . Alcohol Use: No     Comment: not since 1988  . Drug Use: No  . Sexual Activity: Not on file   Other Topics Concern  . Not on file   Social History Narrative    Current Outpatient Prescriptions on File Prior to Visit  Medication  Sig Dispense Refill  . aspirin EC 81 MG tablet Take 81 mg by mouth every morning.     . calcium-vitamin D (OSCAL WITH D) 500-200 MG-UNIT per tablet Take 1 tablet by mouth every other day.    . cholecalciferol (VITAMIN D) 1000 UNITS tablet Take 1,000 Units by mouth every morning.     . fluticasone (FLONASE) 50 MCG/ACT nasal spray PLACE 2 SPRAYS INTO THE NOSE TWICE DAILY AS NEEDED. (Patient taking differently: PLACE 2 SPRAYS INTO THE NOSE TWICE DAILY AS NEEDED FOR ALLERGIES.) 16 g 1  . losartan (COZAAR) 50 MG tablet Take 50 mg by mouth daily. Reported on 06/05/2015    . Multiple Vitamins-Minerals (HAIR SKIN AND NAILS FORMULA PO) Take 1 tablet by mouth every morning. Reported on 06/05/2015    . Omega-3 Fatty Acids (FISH OIL) 600 MG CAPS Take 600 mg by mouth every other day.  Reported on 06/05/2015    . simvastatin (ZOCOR) 20 MG tablet Take 20 mg by mouth daily at 6 PM. Reported on 06/05/2015  3  . sulfaSALAzine (AZULFIDINE) 500 MG tablet Take 500 mg by mouth 2 (two) times daily. Reported on 06/05/2015    . vitamin C (ASCORBIC ACID) 500 MG tablet Take 500 mg by mouth every morning. Reported on 06/05/2015     No current facility-administered medications on file prior to visit.    Allergies  Allergen Reactions  . Latex     REACTION: itching  . Ace Inhibitors     REACTION: cough  . Fentanyl     Hallucination "I go crazy"   . Hydrochlorothiazide     REACTION: decreased sodium  . Penicillins     REACTION: intolerant due to yeast infection Has patient had a PCN reaction causing immediate rash, facial/tongue/throat swelling, SOB or lightheadedness with hypotension:No Has patient had a PCN reaction causing severe rash involving mucus membranes or skin necrosis: No Has patient had a PCN reaction that required hospitalization No Has patient had a PCN reaction occurring within the last 10 years: Yes If all of the above answers are "NO", then may proceed with Cephalosporin use.     Family History  Problem Relation Age of Onset  . Lung cancer Mother   . Coronary artery disease Son     MI at age 75    BP 138/80 mmHg  Pulse 82  Temp(Src) 97.3 F (36.3 C) (Oral)  Ht '5\' 2"'$  (1.575 m)  Wt 180 lb (81.647 kg)  BMI 32.91 kg/m2  SpO2 92%  Review of Systems denies headache, pallor, n/v, syncope, chest pain, anxiety, visual loss, palpitations, fever, rhinorrhea, easy bruising, and rash.  She has intermittent flushing of the face, and weight gain.  No change in chronic doe and arthralgias.      Objective:   Physical Exam VS: see vs page GEN: no distress HEAD: head: no deformity eyes: no periorbital swelling, no proptosis external nose and ears are normal mouth: no lesion seen.  No neurofibromata NECK: supple, thyroid is not enlarged CHEST WALL: no  deformity LUNGS: clear to auscultation CV: reg rate and rhythm, no murmur ABD: abdomen is soft, nontender.  no hepatosplenomegaly.  not distended.  no hernia MUSCULOSKELETAL: muscle bulk and strength are grossly normal.  no obvious joint swelling.  gait is normal and steady EXTEMITIES: no deformity.  no ulcer on the feet.  feet are of normal color and temp.  no edema PULSES: dorsalis pedis intact bilat.  no carotid bruit NEURO:  cn 2-12 grossly intact.  readily moves all 4's.  sensation is intact to touch on the feet SKIN:  Normal texture and temperature.  No rash or suspicious lesion is visible.   NODES:  None palpable at the neck PSYCH: alert, well-oriented.  Does not appear anxious nor depressed.    outside test results are reviewed: TSH=normal  I have reviewed outside records, and summarized: Various possible causes of excessive diaphoresis were considered.  Discontinuation of rheumatol meds did not help sxs, so she was ref here.   i personally reviewed electrocardiogram tracing (04/11/13): Indication: preop Impression: LBBB    Assessment & Plan:  Excessive diaphoresis, new, uncertain etiology. HTN: well-controlled.  same medication  Patient is advised the following: Patient Instructions  A 24-HR urine test is requested for you today.  We'll let you know about the results.   If the results are normal and the symptoms continue, we should repeat this test.  Just let us know.   Also, here is a blood sugar meter, yours to keep.  Please check if you have symptoms, and please let us know what you get.   I would be happy to see you back here as necessary.

## 2015-06-07 DIAGNOSIS — R61 Generalized hyperhidrosis: Secondary | ICD-10-CM | POA: Diagnosis not present

## 2015-06-12 LAB — CATECHOLAMINES, FRACTIONATED, URINE, 24 HOUR
Calculated Total (E+NE): 51 mcg/24 h (ref 26–121)
Creatinine, Urine mg/day-CATEUR: 0.44 g/(24.h) — ABNORMAL LOW (ref 0.63–2.50)
Dopamine, 24 hr Urine: 90 mcg/24 h (ref 52–480)
Epinephrine, 24 hr Urine: 5 mcg/24 h (ref 2–24)
Norepinephrine, 24 hr Ur: 46 mcg/24 h (ref 15–100)
Total Volume - CF 24Hr U: 500 mL

## 2015-06-12 LAB — METANEPHRINES, URINE, 24 HOUR
Metaneph Total, Ur: 234 mcg/24 h (ref 224–832)
Metanephrines, Ur: 49 mcg/24 h — ABNORMAL LOW (ref 90–315)
Normetanephrine, 24H Ur: 185 mcg/24 h (ref 122–676)

## 2015-06-14 ENCOUNTER — Telehealth: Payer: Self-pay | Admitting: Endocrinology

## 2015-06-14 DIAGNOSIS — R61 Generalized hyperhidrosis: Secondary | ICD-10-CM

## 2015-06-14 NOTE — Telephone Encounter (Signed)
Patient stated that if it wasn't a uti  could be something else, please advise

## 2015-06-14 NOTE — Telephone Encounter (Signed)
Pt had testing and she need to know what the results were and what the next steps are for her

## 2015-06-14 NOTE — Telephone Encounter (Signed)
See note below and please advise, Thanks! 

## 2015-06-14 NOTE — Telephone Encounter (Signed)
The urine is normal. However, if the symptoms continue, we should repeat this test. Please let us know if so

## 2015-06-15 ENCOUNTER — Encounter: Payer: Self-pay | Admitting: Endocrinology

## 2015-06-15 NOTE — Telephone Encounter (Signed)
Ok, i ordered

## 2015-06-15 NOTE — Addendum Note (Signed)
Addended by: Renato Shin on: 06/15/2015 02:52 PM   Modules accepted: Orders

## 2015-06-15 NOTE — Telephone Encounter (Signed)
I contacted the pt and advised of note below. Pt stated she would like to repeat the 24 hr urine result. Pt will come by today to pick the bottle up and will drop the specimen off either Tuesday or Wednesday of next week.

## 2015-06-15 NOTE — Telephone Encounter (Signed)
I contacted the pt and advised of note below. Pt wanted to know since the results were normal is there any other tests we could do to try and figure out what is causing her symptoms. Pt also wanted to know if she repeated the 24 hour urine and it came back normal again what would be the next steps. Pt requested you to call back she states she is frustrated about not knowing the plan from here. Please advise, Thanks!

## 2015-06-15 NOTE — Telephone Encounter (Signed)
I can't come up with any other causes in this specialty.

## 2015-06-15 NOTE — Telephone Encounter (Signed)
See note below and please advise, Thanks! 

## 2015-06-20 DIAGNOSIS — R61 Generalized hyperhidrosis: Secondary | ICD-10-CM | POA: Diagnosis not present

## 2015-06-20 NOTE — Addendum Note (Signed)
Addended by: Kaylyn Lim I on: 06/20/2015 10:27 AM   Modules accepted: Orders

## 2015-06-25 ENCOUNTER — Other Ambulatory Visit: Payer: Self-pay | Admitting: Family Medicine

## 2015-06-25 DIAGNOSIS — F39 Unspecified mood [affective] disorder: Secondary | ICD-10-CM | POA: Diagnosis not present

## 2015-06-25 DIAGNOSIS — Z8542 Personal history of malignant neoplasm of other parts of uterus: Secondary | ICD-10-CM

## 2015-06-25 DIAGNOSIS — R61 Generalized hyperhidrosis: Secondary | ICD-10-CM | POA: Diagnosis not present

## 2015-06-25 DIAGNOSIS — I10 Essential (primary) hypertension: Secondary | ICD-10-CM | POA: Diagnosis not present

## 2015-06-25 DIAGNOSIS — E7800 Pure hypercholesterolemia, unspecified: Secondary | ICD-10-CM | POA: Diagnosis not present

## 2015-06-27 DIAGNOSIS — R61 Generalized hyperhidrosis: Secondary | ICD-10-CM | POA: Diagnosis not present

## 2015-06-27 LAB — CATECHOLAMINES, FRACTIONATED, URINE, 24 HOUR
Calculated Total (E+NE): 50 mcg/24 h (ref 26–121)
Creatinine, Urine mg/day-CATEUR: 0.7 g/(24.h) (ref 0.63–2.50)
Dopamine, 24 hr Urine: 123 mcg/24 h (ref 52–480)
Norepinephrine, 24 hr Ur: 50 mcg/24 h (ref 15–100)
Total Volume - CF 24Hr U: 1550 mL

## 2015-06-27 LAB — METANEPHRINES, URINE, 24 HOUR
Metaneph Total, Ur: 337 mcg/24 h (ref 224–832)
Metanephrines, Ur: 79 mcg/24 h — ABNORMAL LOW (ref 90–315)
Normetanephrine, 24H Ur: 258 mcg/24 h (ref 122–676)

## 2015-06-29 ENCOUNTER — Ambulatory Visit
Admission: RE | Admit: 2015-06-29 | Discharge: 2015-06-29 | Disposition: A | Discharge status: Home / Self Care | Payer: PPO | Source: Ambulatory Visit | Attending: Family Medicine | Admitting: Family Medicine

## 2015-06-29 ENCOUNTER — Other Ambulatory Visit: Payer: PPO

## 2015-06-29 DIAGNOSIS — Z8542 Personal history of malignant neoplasm of other parts of uterus: Secondary | ICD-10-CM

## 2015-06-29 DIAGNOSIS — K8020 Calculus of gallbladder without cholecystitis without obstruction: Secondary | ICD-10-CM | POA: Diagnosis not present

## 2015-06-29 DIAGNOSIS — R61 Generalized hyperhidrosis: Secondary | ICD-10-CM

## 2015-07-16 DIAGNOSIS — R232 Flushing: Secondary | ICD-10-CM | POA: Diagnosis not present

## 2015-07-16 DIAGNOSIS — Z79899 Other long term (current) drug therapy: Secondary | ICD-10-CM | POA: Diagnosis not present

## 2015-07-16 DIAGNOSIS — M8580 Other specified disorders of bone density and structure, unspecified site: Secondary | ICD-10-CM | POA: Diagnosis not present

## 2015-07-16 DIAGNOSIS — C3490 Malignant neoplasm of unspecified part of unspecified bronchus or lung: Secondary | ICD-10-CM | POA: Diagnosis not present

## 2015-07-16 DIAGNOSIS — M0589 Other rheumatoid arthritis with rheumatoid factor of multiple sites: Secondary | ICD-10-CM | POA: Diagnosis not present

## 2015-07-16 DIAGNOSIS — R29898 Other symptoms and signs involving the musculoskeletal system: Secondary | ICD-10-CM | POA: Diagnosis not present

## 2015-07-16 DIAGNOSIS — M150 Primary generalized (osteo)arthritis: Secondary | ICD-10-CM | POA: Diagnosis not present

## 2015-07-30 DIAGNOSIS — R61 Generalized hyperhidrosis: Secondary | ICD-10-CM

## 2015-07-30 HISTORY — DX: Generalized hyperhidrosis: R61

## 2015-08-07 ENCOUNTER — Ambulatory Visit (INDEPENDENT_AMBULATORY_CARE_PROVIDER_SITE_OTHER): Payer: PPO | Admitting: Podiatry

## 2015-08-07 ENCOUNTER — Encounter: Payer: Self-pay | Admitting: Podiatry

## 2015-08-07 DIAGNOSIS — M2042 Other hammer toe(s) (acquired), left foot: Secondary | ICD-10-CM

## 2015-08-07 DIAGNOSIS — B351 Tinea unguium: Secondary | ICD-10-CM | POA: Diagnosis not present

## 2015-08-07 DIAGNOSIS — M79676 Pain in unspecified toe(s): Secondary | ICD-10-CM | POA: Diagnosis not present

## 2015-08-07 NOTE — Progress Notes (Signed)
Patient ID: Taylor Berry, female   DOB: May 23, 1940, 75 y.o.   MRN: [id number] Complaint:  Visit Type: Patient returns to my office for continued preventative foot care services. Complaint: Patient states" my nails have grown long and thick and become painful to walk and wear shoes".She presents for preventative foot care services. No changes to ROS  Podiatric Exam: Vascular: dorsalis pedis and posterior tibial pulses are palpable bilateral. Capillary return is immediate. Temperature gradient is WNL. Skin turgor WNL  Sensorium: Normal Semmes Weinstein monofilament test. Normal tactile sensation bilaterally. Nail Exam: Pt has thick disfigured discolored nails with subungual debris noted bilateral entire nail hallux through fifth toenails Ulcer Exam: There is no evidence of ulcer or pre-ulcerative changes or infection. Orthopedic Exam: Muscle tone and strength are WNL. No limitations in general ROM. No crepitus or effusions noted. Foot type and digits show no abnormalities. Bony prominences are unremarkable. Skin: No Porokeratosis. No infection or ulcers  Diagnosis:  Tinea unguium, Pain in right toe, pain in left toes  Treatment & Plan Procedures and Treatment: Consent by patient was obtained for treatment procedures. The patient understood the discussion of treatment and procedures well. All questions were answered thoroughly reviewed. Debridement of mycotic and hypertrophic toenails, 1 through 5 bilateral and clearing of subungual debris. No ulceration, no infection noted. Patient has pain second toe left foot at PIPJ .  Patient has history RA.  Told her to take ibuprofen 200 mg.  Two qid prn pain toe.  If the pain worsens RTC for medical evaluation.  Return Visit-Office Procedure: Patient instructed to return to the office for a follow up visit 3 months for continued evaluation and treatment.   Gardiner Barefoot DPM

## 2015-09-12 ENCOUNTER — Telehealth: Payer: Self-pay | Admitting: *Deleted

## 2015-09-12 NOTE — Progress Notes (Signed)
  Oncology Nurse Navigator Documentation  Navigator Location: [facility] (09/12/15 1400) Navigator Encounter Type: Telephone (09/12/15 1400)  I called patient to check in.  Patient reports that she has been doing well.  She had lost some weight and was going to the gym but had cataract surgery and was unable to exercise.  She has experienced decreased stamina and is now ready to return to the gym.  She reports that a stressor for her currently is her son's recent hospitalization for a cardiac event.  Prior to leaving the hospital, she was told that an application would be submitted for Disability and Medicaid for her son.  She has been calling the numbers provided to check the status of his application and has not received any returned calls.  I told her I would try to assist.  I consulted the [place] and gave Ms. Quentin Cornwall contact information for Disability and Medicaid applications.  I have also left a message with the appropriate financial counselor and requested that she return my call so that I can obtain information for patient.           Treatment Phase: Other (09/12/15 1400) Barriers/Navigation Needs: Family concerns (09/12/15 1400)   Interventions: Coordination of Care (09/12/15 1400)                      Time Spent with Patient: 30 (09/12/15 1400)

## 2015-09-26 ENCOUNTER — Telehealth: Payer: Self-pay | Admitting: *Deleted

## 2015-09-26 NOTE — Telephone Encounter (Signed)
  Oncology Nurse Navigator Documentation  Navigator Location: [facility] (09/26/15 1435) Navigator Encounter Type: Telephone (09/26/15 1435)  I followed up with patient to let her know that I was able to speak to a financial counselor on her behalf regarding her son.  I informed her that they would be contacting him to discuss his needs.             Barriers/Navigation Needs: Family concerns (09/26/15 1435)   Interventions: Coordination of Care (09/26/15 1435)                      Time Spent with Patient: 15 (09/26/15 1435)

## 2015-09-28 DIAGNOSIS — Z23 Encounter for immunization: Secondary | ICD-10-CM | POA: Diagnosis not present

## 2015-10-17 DIAGNOSIS — M150 Primary generalized (osteo)arthritis: Secondary | ICD-10-CM | POA: Diagnosis not present

## 2015-10-17 DIAGNOSIS — C3490 Malignant neoplasm of unspecified part of unspecified bronchus or lung: Secondary | ICD-10-CM | POA: Diagnosis not present

## 2015-10-17 DIAGNOSIS — Z79899 Other long term (current) drug therapy: Secondary | ICD-10-CM | POA: Diagnosis not present

## 2015-10-17 DIAGNOSIS — M0589 Other rheumatoid arthritis with rheumatoid factor of multiple sites: Secondary | ICD-10-CM | POA: Diagnosis not present

## 2015-10-17 DIAGNOSIS — R29898 Other symptoms and signs involving the musculoskeletal system: Secondary | ICD-10-CM | POA: Diagnosis not present

## 2015-10-17 DIAGNOSIS — R232 Flushing: Secondary | ICD-10-CM | POA: Diagnosis not present

## 2015-10-17 DIAGNOSIS — M8580 Other specified disorders of bone density and structure, unspecified site: Secondary | ICD-10-CM | POA: Diagnosis not present

## 2015-10-19 DIAGNOSIS — Z803 Family history of malignant neoplasm of breast: Secondary | ICD-10-CM | POA: Diagnosis not present

## 2015-10-19 DIAGNOSIS — Z1231 Encounter for screening mammogram for malignant neoplasm of breast: Secondary | ICD-10-CM | POA: Diagnosis not present

## 2015-11-20 ENCOUNTER — Encounter: Payer: Self-pay | Admitting: Podiatry

## 2015-11-20 ENCOUNTER — Ambulatory Visit (INDEPENDENT_AMBULATORY_CARE_PROVIDER_SITE_OTHER): Payer: PPO | Admitting: Podiatry

## 2015-11-20 VITALS — BP 134/84 | HR 78

## 2015-11-20 DIAGNOSIS — B351 Tinea unguium: Secondary | ICD-10-CM | POA: Diagnosis not present

## 2015-11-20 DIAGNOSIS — M79676 Pain in unspecified toe(s): Secondary | ICD-10-CM

## 2015-11-20 NOTE — Progress Notes (Signed)
Patient ID: Taylor Berry, female   DOB: 1940/04/13, 75 y.o.   MRN: [id number] Complaint:  Visit Type: Patient returns to my office for continued preventative foot care services. Complaint: Patient states" my nails have grown long and thick and become painful to walk and wear shoes".She presents for preventative foot care services. No changes to ROS  Podiatric Exam: Vascular: dorsalis pedis and posterior tibial pulses are palpable bilateral. Capillary return is immediate. Temperature gradient is WNL. Skin turgor WNL  Sensorium: Normal Semmes Weinstein monofilament test. Normal tactile sensation bilaterally. Nail Exam: Pt has thick disfigured discolored nails with subungual debris noted bilateral entire nail hallux through fifth toenails Ulcer Exam: There is no evidence of ulcer or pre-ulcerative changes or infection. Orthopedic Exam: Muscle tone and strength are WNL. No limitations in general ROM. No crepitus or effusions noted. Foot type and digits show no abnormalities. Bony prominences are unremarkable. Skin: No Porokeratosis. No infection or ulcers  Diagnosis:  Tinea unguium, Pain in right toe, pain in left toes  Treatment & Plan Procedures and Treatment: Consent by patient was obtained for treatment procedures. The patient understood the discussion of treatment and procedures well. All questions were answered thoroughly reviewed. Debridement of mycotic and hypertrophic toenails, 1 through 5 bilateral and clearing of subungual debris. No ulceration, no infection noted.  Return Visit-Office Procedure: Patient instructed to return to the office for a follow up visit 3 months for continued evaluation and treatment.   Gardiner Barefoot DPM

## 2015-11-28 DIAGNOSIS — H903 Sensorineural hearing loss, bilateral: Secondary | ICD-10-CM | POA: Diagnosis not present

## 2015-12-17 DIAGNOSIS — Z79899 Other long term (current) drug therapy: Secondary | ICD-10-CM | POA: Diagnosis not present

## 2015-12-17 DIAGNOSIS — C3490 Malignant neoplasm of unspecified part of unspecified bronchus or lung: Secondary | ICD-10-CM | POA: Diagnosis not present

## 2015-12-17 DIAGNOSIS — E669 Obesity, unspecified: Secondary | ICD-10-CM | POA: Diagnosis not present

## 2015-12-17 DIAGNOSIS — M150 Primary generalized (osteo)arthritis: Secondary | ICD-10-CM | POA: Diagnosis not present

## 2015-12-17 DIAGNOSIS — M8580 Other specified disorders of bone density and structure, unspecified site: Secondary | ICD-10-CM | POA: Diagnosis not present

## 2015-12-17 DIAGNOSIS — R29898 Other symptoms and signs involving the musculoskeletal system: Secondary | ICD-10-CM | POA: Diagnosis not present

## 2015-12-17 DIAGNOSIS — Z6831 Body mass index (BMI) 31.0-31.9, adult: Secondary | ICD-10-CM | POA: Diagnosis not present

## 2015-12-17 DIAGNOSIS — M0589 Other rheumatoid arthritis with rheumatoid factor of multiple sites: Secondary | ICD-10-CM | POA: Diagnosis not present

## 2015-12-17 DIAGNOSIS — R232 Flushing: Secondary | ICD-10-CM | POA: Diagnosis not present

## 2015-12-19 DIAGNOSIS — D485 Neoplasm of uncertain behavior of skin: Secondary | ICD-10-CM | POA: Diagnosis not present

## 2015-12-19 DIAGNOSIS — Z85828 Personal history of other malignant neoplasm of skin: Secondary | ICD-10-CM | POA: Diagnosis not present

## 2015-12-19 DIAGNOSIS — D225 Melanocytic nevi of trunk: Secondary | ICD-10-CM | POA: Diagnosis not present

## 2015-12-19 DIAGNOSIS — L853 Xerosis cutis: Secondary | ICD-10-CM | POA: Diagnosis not present

## 2015-12-19 DIAGNOSIS — L720 Epidermal cyst: Secondary | ICD-10-CM | POA: Diagnosis not present

## 2015-12-19 DIAGNOSIS — L821 Other seborrheic keratosis: Secondary | ICD-10-CM | POA: Diagnosis not present

## 2016-01-04 DIAGNOSIS — H26492 Other secondary cataract, left eye: Secondary | ICD-10-CM | POA: Diagnosis not present

## 2016-01-04 DIAGNOSIS — Z961 Presence of intraocular lens: Secondary | ICD-10-CM | POA: Diagnosis not present

## 2016-01-04 DIAGNOSIS — H02839 Dermatochalasis of unspecified eye, unspecified eyelid: Secondary | ICD-10-CM | POA: Diagnosis not present

## 2016-01-04 DIAGNOSIS — H26493 Other secondary cataract, bilateral: Secondary | ICD-10-CM | POA: Diagnosis not present

## 2016-01-04 DIAGNOSIS — H18413 Arcus senilis, bilateral: Secondary | ICD-10-CM | POA: Diagnosis not present

## 2016-01-11 DIAGNOSIS — H2513 Age-related nuclear cataract, bilateral: Secondary | ICD-10-CM | POA: Diagnosis not present

## 2016-01-16 DIAGNOSIS — E7800 Pure hypercholesterolemia, unspecified: Secondary | ICD-10-CM | POA: Diagnosis not present

## 2016-01-16 DIAGNOSIS — I10 Essential (primary) hypertension: Secondary | ICD-10-CM | POA: Diagnosis not present

## 2016-01-16 DIAGNOSIS — G4733 Obstructive sleep apnea (adult) (pediatric): Secondary | ICD-10-CM | POA: Diagnosis not present

## 2016-01-16 DIAGNOSIS — C3492 Malignant neoplasm of unspecified part of left bronchus or lung: Secondary | ICD-10-CM | POA: Diagnosis not present

## 2016-01-16 DIAGNOSIS — K219 Gastro-esophageal reflux disease without esophagitis: Secondary | ICD-10-CM | POA: Diagnosis not present

## 2016-01-16 DIAGNOSIS — M069 Rheumatoid arthritis, unspecified: Secondary | ICD-10-CM | POA: Diagnosis not present

## 2016-01-16 DIAGNOSIS — M810 Age-related osteoporosis without current pathological fracture: Secondary | ICD-10-CM | POA: Diagnosis not present

## 2016-01-16 DIAGNOSIS — F39 Unspecified mood [affective] disorder: Secondary | ICD-10-CM | POA: Diagnosis not present

## 2016-01-16 DIAGNOSIS — I2729 Other secondary pulmonary hypertension: Secondary | ICD-10-CM | POA: Diagnosis not present

## 2016-01-16 DIAGNOSIS — Z85118 Personal history of other malignant neoplasm of bronchus and lung: Secondary | ICD-10-CM | POA: Diagnosis not present

## 2016-01-16 DIAGNOSIS — Z0000 Encounter for general adult medical examination without abnormal findings: Secondary | ICD-10-CM | POA: Diagnosis not present

## 2016-01-29 DIAGNOSIS — H26491 Other secondary cataract, right eye: Secondary | ICD-10-CM | POA: Diagnosis not present

## 2016-02-19 ENCOUNTER — Ambulatory Visit (INDEPENDENT_AMBULATORY_CARE_PROVIDER_SITE_OTHER): Payer: PPO | Admitting: Podiatry

## 2016-02-19 DIAGNOSIS — B351 Tinea unguium: Secondary | ICD-10-CM | POA: Diagnosis not present

## 2016-02-19 DIAGNOSIS — M79676 Pain in unspecified toe(s): Secondary | ICD-10-CM

## 2016-02-19 NOTE — Progress Notes (Signed)
Patient ID: Taylor Berry, female   DOB: 1940-10-07, 76 y.o.   MRN: [id number] Complaint:  Visit Type: Patient returns to my office for continued preventative foot care services. Complaint: Patient states" my nails have grown long and thick and become painful to walk and wear shoes".She presents for preventative foot care services. No changes to ROS  Podiatric Exam: Vascular: dorsalis pedis and posterior tibial pulses are palpable bilateral. Capillary return is immediate. Temperature gradient is WNL. Skin turgor WNL  Sensorium: Normal Semmes Weinstein monofilament test. Normal tactile sensation bilaterally. Nail Exam: Pt has thick disfigured discolored nails with subungual debris noted bilateral entire nail hallux through fifth toenails Ulcer Exam: There is no evidence of ulcer or pre-ulcerative changes or infection. Orthopedic Exam: Muscle tone and strength are WNL. No limitations in general ROM. No crepitus or effusions noted. Foot type and digits show no abnormalities. Bony prominences are unremarkable. Skin: No Porokeratosis. No infection or ulcers  Diagnosis:  Tinea unguium, Pain in right toe, pain in left toes  Treatment & Plan Procedures and Treatment: Consent by patient was obtained for treatment procedures. The patient understood the discussion of treatment and procedures well. All questions were answered thoroughly reviewed. Debridement of mycotic and hypertrophic toenails, 1 through 5 bilateral and clearing of subungual debris. No ulceration, no infection noted.  Return Visit-Office Procedure: Patient instructed to return to the office for a follow up visit 3 months for continued evaluation and treatment.   Gardiner Barefoot DPM

## 2016-02-20 DIAGNOSIS — M810 Age-related osteoporosis without current pathological fracture: Secondary | ICD-10-CM | POA: Diagnosis not present

## 2016-03-18 ENCOUNTER — Other Ambulatory Visit: Payer: Self-pay | Admitting: *Deleted

## 2016-03-18 DIAGNOSIS — Z79899 Other long term (current) drug therapy: Secondary | ICD-10-CM | POA: Diagnosis not present

## 2016-03-18 DIAGNOSIS — M150 Primary generalized (osteo)arthritis: Secondary | ICD-10-CM | POA: Diagnosis not present

## 2016-03-18 DIAGNOSIS — M0589 Other rheumatoid arthritis with rheumatoid factor of multiple sites: Secondary | ICD-10-CM | POA: Diagnosis not present

## 2016-03-18 DIAGNOSIS — C3412 Malignant neoplasm of upper lobe, left bronchus or lung: Secondary | ICD-10-CM

## 2016-03-18 DIAGNOSIS — R29898 Other symptoms and signs involving the musculoskeletal system: Secondary | ICD-10-CM | POA: Diagnosis not present

## 2016-03-18 DIAGNOSIS — R232 Flushing: Secondary | ICD-10-CM | POA: Diagnosis not present

## 2016-03-18 DIAGNOSIS — M810 Age-related osteoporosis without current pathological fracture: Secondary | ICD-10-CM | POA: Diagnosis not present

## 2016-03-18 DIAGNOSIS — C3490 Malignant neoplasm of unspecified part of unspecified bronchus or lung: Secondary | ICD-10-CM | POA: Diagnosis not present

## 2016-03-18 DIAGNOSIS — Z6831 Body mass index (BMI) 31.0-31.9, adult: Secondary | ICD-10-CM | POA: Diagnosis not present

## 2016-03-18 DIAGNOSIS — E669 Obesity, unspecified: Secondary | ICD-10-CM | POA: Diagnosis not present

## 2016-03-31 DIAGNOSIS — M810 Age-related osteoporosis without current pathological fracture: Secondary | ICD-10-CM | POA: Diagnosis not present

## 2016-04-08 ENCOUNTER — Encounter: Payer: Self-pay | Admitting: Sports Medicine

## 2016-04-08 ENCOUNTER — Ambulatory Visit (INDEPENDENT_AMBULATORY_CARE_PROVIDER_SITE_OTHER): Payer: PPO | Admitting: Sports Medicine

## 2016-04-08 DIAGNOSIS — M79671 Pain in right foot: Secondary | ICD-10-CM

## 2016-04-08 DIAGNOSIS — B351 Tinea unguium: Secondary | ICD-10-CM

## 2016-04-08 DIAGNOSIS — M79672 Pain in left foot: Secondary | ICD-10-CM

## 2016-04-08 NOTE — Progress Notes (Addendum)
Subjective: Taylor Berry is a 76 y.o. female patient seen today in office with complaint of painful thickened and discolored nails and dry skin wants foot exam seems like started to get callus. Patient is desiring treatment for nail changes and is desiring to discuss orthotics; For nails has tried OTC topicals/Medication in the past with no improvement and has gotten nails trimmed by Dr. Prudence Davidson with no improvement. Reports that nails are becoming difficult to manage because of the thickness. Patient has no other pedal complaints at this time.   Patient Active Problem List   Diagnosis Date Noted  . Diaphoresis 06/05/2015  . Pneumothorax, left 08/31/2013  . Acute bronchitis 05/10/2013  . Anemia in neoplastic disease 05/02/2013  . Left Chest Wound infection 04/29/2013  . Acute blood loss anemia 04/29/2013  . Allergic rhinitis 04/29/2013  . Depression 04/29/2013  . Acute respiratory failure ([place]) 04/22/2013  . Lung cancer, left upper lobe 02/03/2013  . Pulmonary infiltrates 01/05/2013  . Mild pulmonary hypertension 02/17/2011  . OSA (obstructive sleep apnea) 02/17/2011  . Dyspnea on exertion 11/22/2010  . COPD (chronic obstructive pulmonary disease) ([place]) 07/20/2009  . Carcinoma in situ of cervix uteri 06/26/2009  . PURE HYPERCHOLESTEROLEMIA 06/26/2009  . OBESITY 06/26/2009  . UNSPECIFIED ANEMIA 06/26/2009  . HYPERTENSION, BENIGN 06/26/2009  . ARTHRITIS, RHEUMATOID 06/26/2009  . OSTEOPOROSIS 06/26/2009  . Cough 06/26/2009    Current Outpatient Prescriptions on File Prior to Visit  Medication Sig Dispense Refill  . hydroxychloroquine (PLAQUENIL) 200 MG tablet     . losartan (COZAAR) 50 MG tablet Take 50 mg by mouth daily. Reported on 06/05/2015    . Omega-3 Fatty Acids (FISH OIL) 600 MG CAPS Take 600 mg by mouth every other day. Reported on 06/05/2015    . venlafaxine XR (EFFEXOR-XR) 150 MG 24 hr capsule     . vitamin C (ASCORBIC ACID) 500 MG tablet Take 500 mg by mouth every morning.  Reported on 06/05/2015     No current facility-administered medications on file prior to visit.     Allergies  Allergen Reactions  . Latex     REACTION: itching  . Ace Inhibitors     REACTION: cough  . Fentanyl     Hallucination "I go crazy"   . Hydrochlorothiazide     REACTION: decreased sodium  . Penicillins     REACTION: intolerant due to yeast infection Has patient had a PCN reaction causing immediate rash, facial/tongue/throat swelling, SOB or lightheadedness with hypotension:No Has patient had a PCN reaction causing severe rash involving mucus membranes or skin necrosis: No Has patient had a PCN reaction that required hospitalization No Has patient had a PCN reaction occurring within the last 10 years: Yes If all of the above answers are "NO", then may proceed with Cephalosporin use.     Objective: Physical Exam  General: Well developed, nourished, no acute distress, awake, alert and oriented x 3  Vascular: Dorsalis pedis artery 1/4 bilateral, Posterior tibial artery 1/4 bilateral, skin temperature warm to warm proximal to distal bilateral lower extremities, no varicosities, scant pedal hair present bilateral.  Neurological: Gross sensation present via light touch bilateral.   Dermatological: Skin is warm, dry, and supple bilateral, Nails 1-10 are tender, short thick, and discolored with mild subungal debris, no webspace macerations present bilateral, no open lesions present bilateral, no callus/corns/hyperkeratotic tissue present bilateral. + Dry skin plantar forefoot bilateral.  No signs of infection bilateral.  Musculoskeletal: prominent 1st and 5th metatarsal boney deformities noted bilateral. Muscular  strength within normal limits without painon range of motion. No pain with calf compression bilateral.  Assessment and Plan:  Problem List Items Addressed This Visit    None    Visit Diagnoses    Dermatophytosis of nail    -  Primary   Foot pain, bilateral           -Examined patient -Discussed treatment options for painful dystrophic nails  -Nails trimmed/Fungal culture was obtained and sent to [facility] lab -Recommend and dispensed power-steps insoles -Recommend daily skin emollients for dry skin; [place] healthy feet  -Patient to return in 4 weeks for follow up evaluation and discussion of fungal culture results or sooner if symptoms worsen.  Landis Martins, DPM

## 2016-04-09 NOTE — Addendum Note (Signed)
Addended by: Roney Jaffe on: 04/09/2016 07:59 AM   Modules accepted: Orders

## 2016-04-17 ENCOUNTER — Other Ambulatory Visit: Payer: Self-pay | Admitting: Cardiothoracic Surgery

## 2016-04-17 ENCOUNTER — Ambulatory Visit (INDEPENDENT_AMBULATORY_CARE_PROVIDER_SITE_OTHER): Payer: PPO | Admitting: Surgical

## 2016-04-17 ENCOUNTER — Ambulatory Visit
Admission: RE | Admit: 2016-04-17 | Discharge: 2016-04-17 | Disposition: A | Discharge status: Home / Self Care | Payer: PPO | Source: Ambulatory Visit | Attending: Cardiothoracic Surgery | Admitting: Cardiothoracic Surgery

## 2016-04-17 VITALS — BP 115/79 | HR 79 | Resp 20 | Ht 62.0 in | Wt 180.0 lb

## 2016-04-17 DIAGNOSIS — C3412 Malignant neoplasm of upper lobe, left bronchus or lung: Secondary | ICD-10-CM

## 2016-04-17 DIAGNOSIS — Z8541 Personal history of malignant neoplasm of cervix uteri: Secondary | ICD-10-CM | POA: Diagnosis not present

## 2016-04-17 DIAGNOSIS — J984 Other disorders of lung: Secondary | ICD-10-CM

## 2016-04-17 DIAGNOSIS — Z85118 Personal history of other malignant neoplasm of bronchus and lung: Secondary | ICD-10-CM | POA: Diagnosis not present

## 2016-04-17 NOTE — Progress Notes (Signed)
[address]       [place] [address]             [phone]      Taylor Berry  Medical Record #[id number] Date of Birth: [birth year]  Referring: Collene Gobble, MD Primary Care: Osborne Casco, MD  Chief Complaint:   POST OP FOLLOW UP 04/12/2013  PREOPERATIVE DIAGNOSIS: Squamous cell carcinoma of the left upper lobe  with limited pulmonary reserve.  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  SURGICAL PROCEDURE: Video bronchoscopy, left video-assisted  thoracoscopy, mini-thoracotomy, wedge resection of left upper lobe with  lymph node dissection, and biopsy of chest wall.  SURGEON: Lanelle Bal, MD  Lung cancer, left upper lobe- Squamous cell    Primary site: Lung (Left)   Staging method: AJCC 7th Edition   Pathologic: Stage IB (T2a, N0, cM0) signed by [name], MD on 04/14/2013  8:31 AM   Summary: Stage IB (T2a, N0, cM0)  History of Present Illness: Patient is a 76 year old female status post a above described procedure seen in the office on today's date in follow-up for serial CT scanning. The result of the scan is listed below. There are no significant changes or evidence of metastatic disease.         Zubrod Score: At the time of surgery this patient's most appropriate activity status/level should be described as: '[]'$     0    Normal activity, no symptoms '[x]'$     1    Restricted in physical strenuous activity but ambulatory, able to do out light work '[]'$     2    Ambulatory and capable of self care, unable to do work activities, up and about >50 % of waking hours                              '[]'$     3    Only limited self care, in bed greater than 50% of waking hours '[]'$     4    Completely disabled, no self care, confined to bed or chair '[]'$     5    Moribund    Past Medical History:  Diagnosis Date  . Allergic rhinitis   . Claustrophobia    Needs pillow  beneath to lay flat-"gets choking feeling oherwise"  . Complication of anesthesia    Fentanyl allergy, "claustrophobia"  . COPD (chronic obstructive pulmonary disease) (HCC)    cervical cancer  . Cough with expectoration    coughs up phlegm early morning  . GERD (gastroesophageal reflux disease)   . Hyperlipidemia   . Hypertension   . Left bundle branch block   . Lung cancer ([facility])    left upper lobe wedge removed-no further tx.  . Obesity   . OSA (obstructive sleep apnea)    denies.  . Osteoarthritis   . Osteoporosis   . Rheumatoid arthritis(714.0)    Dr. Stark Jock follows  . Shortness of breath   . Uterine cancer ([place])      History  Smoking Status  . Former Smoker  . Packs/day: 1.00  . Years: 35.00  . Types: Cigarettes  . Quit date: 01/07/1988  Smokeless Tobacco  . Never Used    History  Alcohol Use No    Comment: not since 1988     Allergies  Allergen Reactions  . Latex  REACTION: itching  . Ace Inhibitors     REACTION: cough  . Fentanyl     Hallucination "I go crazy"   . Hydrochlorothiazide     REACTION: decreased sodium  . Penicillins     REACTION: intolerant due to yeast infection Has patient had a PCN reaction causing immediate rash, facial/tongue/throat swelling, SOB or lightheadedness with hypotension:No Has patient had a PCN reaction causing severe rash involving mucus membranes or skin necrosis: No Has patient had a PCN reaction that required hospitalization No Has patient had a PCN reaction occurring within the last 10 years: Yes If all of the above answers are "NO", then may proceed with Cephalosporin use.     Current Outpatient Prescriptions  Medication Sig Dispense Refill  . hydroxychloroquine (PLAQUENIL) 200 MG tablet     . losartan (COZAAR) 50 MG tablet Take 50 mg by mouth daily. Reported on 06/05/2015    . Omega-3 Fatty Acids (FISH OIL) 600 MG CAPS Take 600 mg by mouth every other day. Reported on 06/05/2015    . venlafaxine XR  (EFFEXOR-XR) 150 MG 24 hr capsule     . vitamin C (ASCORBIC ACID) 500 MG tablet Take 500 mg by mouth every morning. Reported on 06/05/2015     No current facility-administered medications for this visit.        Physical Exam: Ht '5\' 2"'$  (1.575 m)   Wt 180 lb (81.6 kg)   BMI 32.92 kg/m   General appearance: alert, cooperative and appears older than stated age Heart: regular rate and rhythm, S1, S2 normal, soft systoloc murmur, click, rub or gallop Lungs: diminished breath sounds bibasilar, mildly Abdomen: soft, non-tender; bowel sounds normal; no masses,  no organomegaly Extremities: no edema Wound: Her left chest port sites and incision are well-healed without evidence of drainage erythema or infection   Diagnostic Studies & Laboratory data:     Recent Radiology Findings:  Ct Chest Wo Contrast  Result Date: 04/17/2016 CLINICAL DATA:  Left upper lobe squamous cell lung carcinoma status post left upper lobe wedge resection 04/12/2013, presenting for restaging. Additional history of hysterectomy for uterine cancer. Remote smoking history. EXAM: CT CHEST WITHOUT CONTRAST TECHNIQUE: Multidetector CT imaging of the chest was performed following the standard protocol without IV contrast. COMPARISON:  04/26/2015 chest CT. FINDINGS: Cardiovascular: Normal heart size. No significant pericardial fluid/thickening. Left main, left anterior descending and right coronary atherosclerosis. Atherosclerotic nonaneurysmal thoracic aorta. Normal caliber pulmonary arteries. Mediastinum/Nodes: No discrete thyroid nodules. Unremarkable esophagus. No pathologically enlarged axillary, mediastinal or gross hilar lymph nodes, noting limited sensitivity for the detection of hilar adenopathy on this noncontrast study. Lungs/Pleura: No pneumothorax. No pleural effusion. Mild centrilobular and paraseptal emphysema. Status post left upper lobe wedge resection. Stable small focus of loculated pleural air in the peripheral  wedge resection site (series 4/image 40). Stable mild bronchiectasis at the wedge resection site. Stable 3 mm pulmonary nodules in the posterior left upper lobe along the major fissure (series 4/ image 39) and peripheral apical right upper lobe (series 4/ image 21). Stable mild patchy subpleural reticulation with associated traction bronchiolectasis in both lungs, basilar predominant. No frank honeycombing. No acute consolidative airspace disease, lung masses or new significant pulmonary nodules. Upper abdomen: Cholelithiasis. Musculoskeletal: No aggressive appearing focal osseous lesions. Moderate thoracic spondylosis. IMPRESSION: 1. No findings to suggest local tumor recurrence in the left upper lobe status post wedge resection. Stable small focus of loculated pleural air at the peripheral wedge resection site. 2. No evidence of metastatic disease  in the chest. 3. Aortic atherosclerosis. Left main and two-vessel coronary atherosclerosis. 4. Mild emphysema. Electronically Signed   By: Ilona Sorrel M.D.   On: 04/17/2016 13:19         Ct Chest Wo Contrast  04/26/2015  CLINICAL DATA:  Lung cancer with left upper lobe resection. Uterine cancer with hysterectomy. Ex-smoker. Follow-up lung nodule. EXAM: CT CHEST WITHOUT CONTRAST TECHNIQUE: Multidetector CT imaging of the chest was performed following the standard protocol without IV contrast. COMPARISON:  11/02/2014 FINDINGS: Mediastinum/Nodes: Aortic and branch vessel atherosclerosis. Mild cardiomegaly. Multivessel coronary artery atherosclerosis. Pulmonary artery enlargement, with the main pulmonary arteries measuring 2.9 and 3.1 cm respectively. 7 mm precarinal node is unchanged and not pathologic by size criteria. Hilar regions poorly evaluated without intravenous contrast. Lungs/Pleura: No pleural fluid. Lower lobe predominant bronchial wall thickening. Mild centrilobular emphysema. 2 mm right apical pulmonary nodule is unchanged on image 19/series 4. Right  middle lobe volume loss and mild bronchiectasis. Similar. Bibasilar bronchial wall thickening is moderate and likely post infectious or inflammatory. Posterior left upper lobe wedge resection. Similar appearance of for trace residual loculated pleural air. Example image 52/series 4. Nodularity along the surgical sutures is resolved. Similar minimal thickening of the left major fissure image 41/series 4. Upper abdomen: Left hepatic lobe subcentimeter cyst. Probable gallstones. Normal imaged portions of the spleen, stomach, pancreas, adrenal glands, kidneys. Musculoskeletal: Moderate thoracic spondylosis IMPRESSION: 1. Status post left upper lobe wedge resection, without recurrent or metastatic disease. 2. Decrease in nodularity along the surgical sutures. Persistent loculated trace left pleural air, postoperative. 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 3.  Atherosclerosis, including within the coronary arteries. 4. Pulmonary artery enlargement suggests pulmonary arterial hypertension. Electronically Signed   By: Abigail Miyamoto M.D.   On: 04/26/2015 14:14   Ct Chest Wo Contrast  11/02/2014  CLINICAL DATA:  Lung cancer status post left upper lobe wedge resection in 04/2013. History of uterine cancer status post hysterectomy in 1971. EXAM: CT CHEST WITHOUT CONTRAST TECHNIQUE: Multidetector CT imaging of the chest was performed following the standard protocol without IV contrast. COMPARISON:  04/13/2014 FINDINGS: Mediastinum/Nodes: The heart is normal in size. No pericardial effusion. Coronary atherosclerosis. Atherosclerotic calcifications of the aortic arch. Small mediastinal lymph nodes measuring up to 7 mm short axis (series 3/ image 25), unchanged. Visualized left thyroid is mildly enlarged/heterogeneous. Lungs/Pleura: Postsurgical changes related to posterior left upper lobe wedge resection (series 4/image 28). Minimal nodularity along the left fissure (series 4/ image 22), postsurgical. 2-3 mm subpleural  nodule in the lateral right lung apex (series 4/image 11, unchanged since 2015, benign. No suspicious pulmonary nodules. Mild scarring with bronchiectasis in the right middle lobe (series 4/image 40). Mild bronchial wall thickening with subpleural reticulation/ fibrosis in the bilateral lower lobes. No focal consolidation. Underlying mild paraseptal emphysematous changes. No pleural effusion or pneumothorax. Upper abdomen: Visualized upper abdomen is notable for a 10 mm left adrenal adenoma. Musculoskeletal: Degenerative changes of the visualized thoracolumbar spine. IMPRESSION: Postsurgical changes related to left upper lobe wedge resection. No evidence of recurrent or metastatic disease in the chest. Electronically Signed   By: Julian Hy M.D.   On: 11/02/2014 15:14   Ct Chest Wo Contrast  04/13/2014   CLINICAL DATA:  76 year old female with left upper lobe lung cancer status post resection 6 months ago. Tenderness at the surgical site.  EXAM: CT CHEST WITHOUT CONTRAST  TECHNIQUE: Multidetector CT imaging of the chest was performed following the standard protocol without IV contrast.  COMPARISON:  Chest CT 10/06/2013.  FINDINGS: Mediastinum/Lymph Nodes: Heart size is normal. There is no significant pericardial fluid, thickening or pericardial calcification. There is atherosclerosis of the thoracic aorta, the great vessels of the mediastinum and the coronary arteries, including calcified atherosclerotic plaque in the left main, left anterior descending and right coronary arteries. No pathologically enlarged mediastinal or hilar lymph nodes. Please note that accurate exclusion of hilar adenopathy is limited on noncontrast CT scans. Esophagus is unremarkable in appearance. No axillary lymphadenopathy.  Lungs/Pleura: Postoperative changes of wedge resection are again noted in the lateral aspect of the left upper lobe. Again noted is a large peripheral postoperative pneumatocele in this region, which has  decreased significantly in size compared to the prior study. A few scattered 2-3 mm micronodules are noted throughout the lungs bilaterally, peribronchovascular in distribution, favored to represent areas of chronic mucoid impaction within terminal bronchioles, similar to prior studies. No larger more suspicious appearing pulmonary nodules or masses are noted. Mild diffuse bronchial wall thickening, with some scattered areas of cylindrical and mild varicose bronchiectasis, most evident in the lower lobes of the lungs bilaterally. No acute consolidative airspace disease. No pleural effusions. There continues to be a small amount of herniation have the left lower lobe through the fifth intercostal space laterally, at the site of thoracotomy. Trace amount of residual gas in the left serratus musculature, significantly decreased compared to the prior examination.  Upper Abdomen: Atherosclerosis.  Musculoskeletal/Soft Tissues: Postthoracotomy changes in the left ribs. There are no aggressive appearing lytic or blastic lesions noted in the visualized portions of the skeleton.  IMPRESSION: 1. Status post left upper lobe wedge resection, with no findings to suggest local recurrence of disease. Decreasing size of postoperative the mass steel in the left upper lobe, and decreasing herniation through the left fifth intercostal space, as discussed above. 2. Diffuse bronchial wall thickening with areas of cylindrical and mild varicose bronchiectasis, most evident in the lower lobes of the lungs bilaterally. These findings are similar to recent prior examinations. 3. Atherosclerosis, including left main and 2 vessel coronary artery disease. Assessment for potential risk factor modification, dietary therapy or pharmacologic therapy may be warranted, if clinically indicated. 4. Additional incidental findings, as above.   Electronically Signed   By: Vinnie Langton M.D.   On: 04/13/2014 13:24    Recent Lab Findings: Lab Results   Component Value Date   WBC 10.5 04/27/2013   HGB 9.4 (L) 04/27/2013   HCT 29.3 (L) 04/27/2013   PLT 274 04/27/2013   GLUCOSE 117 (H) 04/27/2013   ALT 7 04/14/2013   AST 19 04/14/2013   NA 135 (L) 04/27/2013   K 4.5 04/27/2013   CL 98 04/27/2013   CREATININE 0.49 (L) 04/27/2013   BUN 11 04/27/2013   CO2 24 04/27/2013   INR 0.91 04/11/2013   Wt Readings from Last 3 Encounters:  04/17/16 180 lb (81.6 kg)  06/05/15 180 lb (81.6 kg)  05/22/15 183 lb (83 kg)     Assessment / Plan:    The patient is clinically stable from a pulmonary viewpoint without new symptoms. The CAT scan remained stable. The plan is to obtain a new scan in 1 year's time.    Sayeed Weatherall E, PA-C

## 2016-04-17 NOTE — Patient Instructions (Signed)
No changes, encouraged to continue silver sneakers program

## 2016-05-06 ENCOUNTER — Ambulatory Visit (INDEPENDENT_AMBULATORY_CARE_PROVIDER_SITE_OTHER): Payer: PPO | Admitting: Sports Medicine

## 2016-05-06 ENCOUNTER — Encounter: Payer: Self-pay | Admitting: Sports Medicine

## 2016-05-06 DIAGNOSIS — B351 Tinea unguium: Secondary | ICD-10-CM

## 2016-05-06 DIAGNOSIS — M79676 Pain in unspecified toe(s): Secondary | ICD-10-CM

## 2016-05-06 DIAGNOSIS — M79671 Pain in right foot: Secondary | ICD-10-CM | POA: Diagnosis not present

## 2016-05-06 DIAGNOSIS — M79672 Pain in left foot: Secondary | ICD-10-CM

## 2016-05-06 NOTE — Progress Notes (Signed)
Subjective: Taylor Berry is a 76 y.o. female patient seen today in office for fungal culture results. Patient  Patient has no other pedal complaints at this time.   Patient Active Problem List   Diagnosis Date Noted  . Diaphoresis 06/05/2015  . Pneumothorax, left 08/31/2013  . Acute bronchitis 05/10/2013  . Anemia in neoplastic disease 05/02/2013  . Left Chest Wound infection 04/29/2013  . Acute blood loss anemia 04/29/2013  . Allergic rhinitis 04/29/2013  . Depression 04/29/2013  . Acute respiratory failure ([place]) 04/22/2013  . Lung cancer, left upper lobe 02/03/2013  . Pulmonary infiltrates 01/05/2013  . Mild pulmonary hypertension ([place]) 02/17/2011  . OSA (obstructive sleep apnea) 02/17/2011  . Dyspnea on exertion 11/22/2010  . COPD (chronic obstructive pulmonary disease) ([facility]) 07/20/2009  . Carcinoma in situ of cervix uteri 06/26/2009  . PURE HYPERCHOLESTEROLEMIA 06/26/2009  . OBESITY 06/26/2009  . UNSPECIFIED ANEMIA 06/26/2009  . HYPERTENSION, BENIGN 06/26/2009  . ARTHRITIS, RHEUMATOID 06/26/2009  . OSTEOPOROSIS 06/26/2009  . Cough 06/26/2009    Current Outpatient Prescriptions on File Prior to Visit  Medication Sig Dispense Refill  . Calcium-Magnesium-Vitamin D (CALCIUM 1200+D3 PO) Take by mouth daily.    . hydroxychloroquine (PLAQUENIL) 200 MG tablet Take 200 mg by mouth 2 (two) times daily.     . Omega-3 Fatty Acids (FISH OIL) 600 MG CAPS Take 600 mg by mouth every other day. Reported on 06/05/2015    . valsartan (DIOVAN) 80 MG tablet Take 80 mg by mouth daily.    Marland Kitchen venlafaxine XR (EFFEXOR-XR) 150 MG 24 hr capsule Take 150 mg by mouth daily.      No current facility-administered medications on file prior to visit.     Allergies  Allergen Reactions  . Latex     REACTION: itching  . Ace Inhibitors     REACTION: cough  . Fentanyl     Hallucination "I go crazy"   . Hydrochlorothiazide     REACTION: decreased sodium  . Penicillins     REACTION: intolerant due  to yeast infection Has patient had a PCN reaction causing immediate rash, facial/tongue/throat swelling, SOB or lightheadedness with hypotension:No Has patient had a PCN reaction causing severe rash involving mucus membranes or skin necrosis: No Has patient had a PCN reaction that required hospitalization No Has patient had a PCN reaction occurring within the last 10 years: Yes If all of the above answers are "NO", then may proceed with Cephalosporin use.     Objective: Physical Exam  General: Well developed, nourished, no acute distress, awake, alert and oriented x 3  Vascular: Dorsalis pedis artery 1/4 bilateral, Posterior tibial artery 1/4 bilateral, skin temperature warm to warm proximal to distal bilateral lower extremities, no varicosities, pedal hair present bilateral.  Neurological: Gross sensation present via light touch bilateral.   Dermatological: Skin is warm, dry, and supple bilateral, Nails 1-10 are tender, short thick, and discolored with mild subungal debris with bilateral R>L hallux nail most involved, no webspace macerations present bilateral, no open lesions present bilateral, no callus/corns/hyperkeratotic tissue present bilateral. No signs of infection bilateral.  Musculoskeletal: Decreased pain sub met 1 on right at prominent met head, with prominent  boney deformities sub met 1 and 5 noted bilateral. Muscular strength within normal limits without painon range of motion. No pain with calf compression bilateral.  Fungal culture + T Rubrum and Microtrauma   Assessment and Plan:  Problem List Items Addressed This Visit    None    Visit Diagnoses  Dermatophytosis of nail    -  Primary   Relevant Orders   Hepatic Function Panel   Pain of toe, unspecified laterality       Foot pain, bilateral          -Examined patient -Discussed treatment options for painful mycotic nails -Patient opt for oral Lamisil with full understanding of medication risks; ordered LFTs  for review -After patient gets bloodwork will talk with her Rheumatologist Dr. Sedonia Small if she will be a good candidate for oral  -Advised good hygiene habits -Patient to return after discuss results or sooner if symptoms worsen.  Landis Martins, DPM

## 2016-05-07 LAB — HEPATIC FUNCTION PANEL
ALT: 8 U/L (ref 6–29)
AST: 20 U/L (ref 10–35)
Albumin: 3.9 g/dL (ref 3.6–5.1)
Alkaline Phosphatase: 41 U/L (ref 33–130)
Bilirubin, Direct: 0.1 mg/dL (ref <=0.2)
Indirect Bilirubin: 0.3 mg/dL (ref 0.2–1.2)
Total Bilirubin: 0.4 mg/dL (ref 0.2–1.2)
Total Protein: 7 g/dL (ref 6.1–8.1)

## 2016-05-12 ENCOUNTER — Encounter: Payer: Self-pay | Admitting: *Deleted

## 2016-05-12 ENCOUNTER — Telehealth: Payer: Self-pay | Admitting: *Deleted

## 2016-05-12 NOTE — Telephone Encounter (Addendum)
05/07/2016-DrCannon Kettle states pt's liver panel is normal, please advise pt and send a letter to Dr. Amil Amen, Rheumatology, to make sure it is safe for pt to begin Lamisil. We will start pt on Lamisil once advise okay by Dr. Amil Amen.05/12/2016-I informed pt of Dr. Leeanne Rio orders, pt states understanding. Faxed letter requesting medical clearance to Dr. Leigh Aurora. 05/20/2016-Received letter dated 05/13/2016 from Dr. Amil Amen stating okay for pt to take the Lamisil. Dr. Cannon Kettle ordered Lamisil '250mg'$  #90 one tablet daily.05/21/2016-I informed pt of Dr. Leeanne Rio orders. Left message informing pt Dr. Cannon Kettle wanted to see her in 4-6 weeks.

## 2016-05-20 ENCOUNTER — Ambulatory Visit: Payer: PPO | Admitting: Podiatry

## 2016-05-21 ENCOUNTER — Ambulatory Visit: Payer: PPO | Admitting: Emergency Medicine

## 2016-05-21 MED ORDER — TERBINAFINE HCL 250 MG PO TABS: 250.0000 mg | ORAL_TABLET | Freq: Every day | ORAL | 0 refills | Status: DC

## 2016-05-21 NOTE — Telephone Encounter (Signed)
Yes that's correct. She may already have an appointment. -Dr. Chauncey Cruel

## 2016-06-09 ENCOUNTER — Telehealth: Payer: Self-pay | Admitting: Emergency Medicine

## 2016-06-09 NOTE — Telephone Encounter (Signed)
Spoke with patient and asked that she bring her CPAP machine to her appointment tomorrow with RB. Pt states she does not use machine so she will not be bringing it. Nothing further is needed.

## 2016-06-10 ENCOUNTER — Encounter: Payer: Self-pay | Admitting: Emergency Medicine

## 2016-06-10 ENCOUNTER — Ambulatory Visit (INDEPENDENT_AMBULATORY_CARE_PROVIDER_SITE_OTHER): Payer: PPO | Admitting: Emergency Medicine

## 2016-06-10 DIAGNOSIS — J449 Chronic obstructive pulmonary disease, unspecified: Secondary | ICD-10-CM

## 2016-06-10 DIAGNOSIS — C3412 Malignant neoplasm of upper lobe, left bronchus or lung: Secondary | ICD-10-CM

## 2016-06-10 DIAGNOSIS — G4733 Obstructive sleep apnea (adult) (pediatric): Secondary | ICD-10-CM

## 2016-06-10 NOTE — Assessment & Plan Note (Signed)
Again this may not be an accurate diagnosis since she has lost much weight. I do not believe she needs a repeat sleep study at this time given her improved functional capacity

## 2016-06-10 NOTE — Assessment & Plan Note (Signed)
She has documented obstruction on prior pulmonary function testing but I suspect that FEV1 is much improved since she has lost weight, gotten into much better physical condition. She is not on bronchodilators and does not appear to need any. Any COPD present appears to be subclinical.

## 2016-06-10 NOTE — Patient Instructions (Addendum)
We will not start any inhaled medication at this time. Please continue to follow-up with Dr Servando Snare for surveillance CT scans Follow with Dr Lamonte Sakai in 12 months or sooner if you have any problems

## 2016-06-10 NOTE — Progress Notes (Signed)
  ROV 06/10/16 -- follow-up visit for 76 year old woman with a history of obesity, uterine cancer, former tobacco with COPD and a positive bronchodilator response, rheumatoid arthritis (previously on methotrexate, off for years) on plaquenil, untreated obstructive sleep apnea. She underwent left upper lobe wedge resection for stage IB squamous cell lung cancer April 2015. Most recent CT scan of the chest was 04/17/16 that I personally reviewed. This shows no evidence of local recurrence or metastatic disease. She has done much better since losing weight. She is doing silver sneakers. Her exertional tolerance is better, no wheeze even with her workouts. She does not use BD's   EXAM:  Vitals:   06/10/16 1442  BP: 122/82  Pulse: 91  SpO2: 94%  Weight: 180 lb 6.4 oz (81.8 kg)  Height: 5\' 2"  (1.575 m)   Gen: Pleasant, obese, in no distress,  normal affect, in wheelchair   ENT: No lesions,  mouth clear,  oropharynx clear, no postnasal drip  Neck: No JVD, no TMG, no carotid bruits  Lungs: No use of accessory muscles, clear bilaterally, no wheeze.   Cardiovascular: RRR, heart sounds normal, no murmur or gallops, no peripheral edema  Musculoskeletal: No deformities, no cyanosis or clubbing  Neuro: alert, non focal  Skin: Warm, no lesions or rashes   04/17/16 --  COMPARISON:  04/26/2015 chest CT.  FINDINGS: Cardiovascular: Normal heart size. No significant pericardial fluid/thickening. Left main, left anterior descending and right coronary atherosclerosis. Atherosclerotic nonaneurysmal thoracic aorta. Normal caliber pulmonary arteries.  Mediastinum/Nodes: No discrete thyroid nodules. Unremarkable esophagus. No pathologically enlarged axillary, mediastinal or gross hilar lymph nodes, noting limited sensitivity for the detection of hilar adenopathy on this noncontrast study.  Lungs/Pleura: No pneumothorax. No pleural effusion. Mild centrilobular and paraseptal emphysema. Status post  left upper lobe wedge resection. Stable small focus of loculated pleural air in the peripheral wedge resection site (series 4/image 40). Stable mild bronchiectasis at the wedge resection site. Stable 3 mm pulmonary nodules in the posterior left upper lobe along the major fissure (series 4/ image 39) and peripheral apical right upper lobe (series 4/ image 21). Stable mild patchy subpleural reticulation with associated traction bronchiolectasis in both lungs, basilar predominant. No frank honeycombing. No acute consolidative airspace disease, lung masses or new significant pulmonary nodules.  Upper abdomen: Cholelithiasis.  Musculoskeletal: No aggressive appearing focal osseous lesions. Moderate thoracic spondylosis.  IMPRESSION: 1. No findings to suggest local tumor recurrence in the left upper lobe status post wedge resection. Stable small focus of loculated pleural air at the peripheral wedge resection site. 2. No evidence of metastatic disease in the chest. 3. Aortic atherosclerosis. Left main and two-vessel coronary atherosclerosis. 4. Mild emphysema.  COPD (chronic obstructive pulmonary disease) She has documented obstruction on prior pulmonary function testing but I suspect that FEV1 is much improved since she has lost weight, gotten into much better physical condition. She is not on bronchodilators and does not appear to need any. Any COPD present appears to be subclinical.  OSA (obstructive sleep apnea) Again this may not be an accurate diagnosis since she has lost much weight. I do not believe she needs a repeat sleep study at this time given her improved functional capacity  Lung cancer, left upper lobe Following with Dr. Servando Snare for surveillance CT scans. She is now 3 years out from her operation.  Baltazar Apo, MD, PhD 06/10/2016, 2:57 PM [facility] [phone] or if no answer [phone]

## 2016-06-10 NOTE — Assessment & Plan Note (Signed)
Following with Dr. Servando Snare for surveillance CT scans. She is now 3 years out from her operation.

## 2016-06-17 ENCOUNTER — Telehealth: Payer: Self-pay | Admitting: Emergency Medicine

## 2016-06-17 DIAGNOSIS — M810 Age-related osteoporosis without current pathological fracture: Secondary | ICD-10-CM | POA: Diagnosis not present

## 2016-06-17 DIAGNOSIS — C3490 Malignant neoplasm of unspecified part of unspecified bronchus or lung: Secondary | ICD-10-CM | POA: Diagnosis not present

## 2016-06-17 DIAGNOSIS — R232 Flushing: Secondary | ICD-10-CM | POA: Diagnosis not present

## 2016-06-17 DIAGNOSIS — M150 Primary generalized (osteo)arthritis: Secondary | ICD-10-CM | POA: Diagnosis not present

## 2016-06-17 DIAGNOSIS — M0589 Other rheumatoid arthritis with rheumatoid factor of multiple sites: Secondary | ICD-10-CM | POA: Diagnosis not present

## 2016-06-17 DIAGNOSIS — R29898 Other symptoms and signs involving the musculoskeletal system: Secondary | ICD-10-CM | POA: Diagnosis not present

## 2016-06-17 DIAGNOSIS — Z6831 Body mass index (BMI) 31.0-31.9, adult: Secondary | ICD-10-CM | POA: Diagnosis not present

## 2016-06-17 DIAGNOSIS — R0602 Shortness of breath: Secondary | ICD-10-CM | POA: Diagnosis not present

## 2016-06-17 DIAGNOSIS — Z79899 Other long term (current) drug therapy: Secondary | ICD-10-CM | POA: Diagnosis not present

## 2016-06-17 DIAGNOSIS — E669 Obesity, unspecified: Secondary | ICD-10-CM | POA: Diagnosis not present

## 2016-06-17 DIAGNOSIS — R61 Generalized hyperhidrosis: Secondary | ICD-10-CM

## 2016-06-17 NOTE — Telephone Encounter (Signed)
Contact pt and informed her of RB's message. Pt got very upset and is just not understanding why RB needs further information. Informed her again as to what RB message was and why and that her rheumatologist can also place the referral. Pt provided me with the office number [phone] office closes at 4:30. Pt stated again she does not understand why the referral can't just be placed for what she is concerned about which is the excessive sweating and yelled just forget it and hung up the phone.   RB please advise if still would like further information

## 2016-06-17 NOTE — Telephone Encounter (Signed)
I'm not sure why they want to make that referral, not clear to me what they want evaluated. I think the Rheumatologists should make the referral to Oncology so they can explain to them exactly what they want evaluated. Can we get a message to Rheum and ask them to do?

## 2016-06-17 NOTE — Telephone Encounter (Signed)
RB  Please Advise-  Pt called in and stated that she saw a PA-c at her rheumatologist office and they suggested that she follow up with oncology for the excessive sweating. Pt states she does not have an oncologist and wanted to know if you were willing to place the referral.

## 2016-06-17 NOTE — Telephone Encounter (Signed)
lmtcb x1 for pt. 

## 2016-06-17 NOTE — Telephone Encounter (Signed)
Pt returned phone call..ert ° ° °

## 2016-06-18 DIAGNOSIS — R61 Generalized hyperhidrosis: Secondary | ICD-10-CM | POA: Insufficient documentation

## 2016-06-18 NOTE — Telephone Encounter (Signed)
LMTCB

## 2016-06-18 NOTE — Telephone Encounter (Signed)
Spoke with the pt and notified of recs per RB  Referral was made

## 2016-06-18 NOTE — Telephone Encounter (Signed)
Pt returned phone call.the patient contact # [phone].Taylor Berry

## 2016-06-18 NOTE — Telephone Encounter (Signed)
Go ahead and refer her to Oncology per her request, with notes to be sent to her PCP.  The dx / reason for consultation is Hyperhidrosis

## 2016-06-20 DIAGNOSIS — M0589 Other rheumatoid arthritis with rheumatoid factor of multiple sites: Secondary | ICD-10-CM | POA: Diagnosis not present

## 2016-06-30 ENCOUNTER — Encounter: Payer: Self-pay | Admitting: *Deleted

## 2016-06-30 NOTE — Progress Notes (Signed)
Oncology Nurse Navigator Documentation  Oncology Nurse Navigator Flowsheets 06/30/2016  Navigator Location [facility]  Navigator Encounter Type Other/pateint was referred due to excessive sweating.  I updated Dr. Julien Nordmann he states not sure of referral but can see any med onc.  I updated new patient coordinator to schedule with any med onc.  Patient does have HX of early stage lung cancer and is being followed by pulmonary and T surgery.    Interventions Other  Acuity Level 1  Time Spent with Patient 15

## 2016-07-01 ENCOUNTER — Telehealth: Payer: Self-pay | Admitting: Emergency Medicine

## 2016-07-01 NOTE — Telephone Encounter (Signed)
Spoke with [employer]. Explained to her that we do not have any more information for the referral. Referenced the message from 06/17/16 when patient was asked about the referral and reasoning, she became angry on the phone.   [facility]. Spoke with the receptionist who looked in the patient's chart and stated that she was seen last week but did not see notes about a referral. I was transferred to the nurses line and left a message asking for a call back for the reasoning behind the referral.   Will leave this message in triage until we hear back from them.

## 2016-07-01 NOTE — Telephone Encounter (Signed)
Left message for Taylor Berry to call back.

## 2016-07-01 NOTE — Telephone Encounter (Signed)
Elizabeth with Dr. Melissa Noon office is returning call, CB is [phone].

## 2016-07-01 NOTE — Telephone Encounter (Signed)
Spoke with Benjamine Mola at Dr. Melissa Noon office. She read the OV notes from patient's visit from 6/12. Patient was NOT advised to call us for a referral to oncology for her excessive sweating. She was advised to follow up with Dr. Lamonte Sakai due to her history of lung cancer. Benjamine Mola stated if they she needed a referral from them, that they would have placed the order themselves.   Spoke again with Beth at the [facility]. She is aware of the call above. She wants to know if she should just cancel the referral to oncology?   RB, please advise. Thanks.

## 2016-07-02 NOTE — Telephone Encounter (Signed)
RB please advise. Thanks.  

## 2016-07-02 NOTE — Telephone Encounter (Signed)
I made the referral at the patient's request because she indicated that Dr Amil Amen wanted it. The patient was adamant that she wanted to be seen by them. I think you will need to call her, explain the discussions that have been had with Dr Amil Amen and with [place]. Explain none of [place] ([place], myself, Oncology) are sure that she needs to be seen by them. As long as she understands this then cancel the referral. If she insists on going, then Oncology should see her and reassure her that her sx of excessive sweating does not mean that she has some kind of cancer.

## 2016-07-03 ENCOUNTER — Telehealth: Payer: Self-pay | Admitting: Hematology

## 2016-07-03 ENCOUNTER — Encounter: Payer: Self-pay | Admitting: Hematology

## 2016-07-03 DIAGNOSIS — G629 Polyneuropathy, unspecified: Secondary | ICD-10-CM | POA: Diagnosis not present

## 2016-07-03 NOTE — Telephone Encounter (Signed)
Spoke with Beth, aware that we are going to move forward with the Oncology referral.  Nothing further needed.

## 2016-07-03 NOTE — Telephone Encounter (Signed)
Appt has been scheduled for the pt to see Dr. Irene Limbo on 07/29/2016 at 11am. Left the appt date and time on the pt's vm. Letter mailed to the pt.

## 2016-07-03 NOTE — Telephone Encounter (Signed)
Spoke with patient, states that for her to feel 100% reassured about this is to move forward with the Oncology referral. Pt states that with her previous diagnosis of cancer she wants to be safe and sure. Pt is asking that we keep the referral open that we let Oncology know that she wants to schedule.   LM x 1 for Beth at [facility].

## 2016-07-03 NOTE — Telephone Encounter (Signed)
Beth returned call, [phone].

## 2016-07-16 DIAGNOSIS — I10 Essential (primary) hypertension: Secondary | ICD-10-CM | POA: Diagnosis not present

## 2016-07-16 DIAGNOSIS — G629 Polyneuropathy, unspecified: Secondary | ICD-10-CM | POA: Diagnosis not present

## 2016-07-16 DIAGNOSIS — M069 Rheumatoid arthritis, unspecified: Secondary | ICD-10-CM | POA: Diagnosis not present

## 2016-07-16 DIAGNOSIS — C3492 Malignant neoplasm of unspecified part of left bronchus or lung: Secondary | ICD-10-CM | POA: Diagnosis not present

## 2016-07-16 DIAGNOSIS — E7800 Pure hypercholesterolemia, unspecified: Secondary | ICD-10-CM | POA: Diagnosis not present

## 2016-07-16 DIAGNOSIS — F39 Unspecified mood [affective] disorder: Secondary | ICD-10-CM | POA: Diagnosis not present

## 2016-07-21 ENCOUNTER — Encounter: Payer: Self-pay | Admitting: Sports Medicine

## 2016-07-29 ENCOUNTER — Encounter: Payer: Self-pay | Admitting: Hematology

## 2016-07-29 ENCOUNTER — Ambulatory Visit (HOSPITAL_BASED_OUTPATIENT_CLINIC_OR_DEPARTMENT_OTHER): Payer: PPO

## 2016-07-29 ENCOUNTER — Ambulatory Visit (HOSPITAL_BASED_OUTPATIENT_CLINIC_OR_DEPARTMENT_OTHER): Payer: PPO | Admitting: Hematology

## 2016-07-29 ENCOUNTER — Telehealth: Payer: Self-pay | Admitting: Hematology

## 2016-07-29 ENCOUNTER — Encounter: Payer: Self-pay | Admitting: *Deleted

## 2016-07-29 VITALS — BP 132/66 | HR 87 | Temp 98.2°F | Resp 20 | Ht 62.0 in | Wt 181.3 lb

## 2016-07-29 DIAGNOSIS — M069 Rheumatoid arthritis, unspecified: Secondary | ICD-10-CM

## 2016-07-29 DIAGNOSIS — Z85118 Personal history of other malignant neoplasm of bronchus and lung: Secondary | ICD-10-CM

## 2016-07-29 DIAGNOSIS — J449 Chronic obstructive pulmonary disease, unspecified: Secondary | ICD-10-CM

## 2016-07-29 DIAGNOSIS — Z87891 Personal history of nicotine dependence: Secondary | ICD-10-CM

## 2016-07-29 DIAGNOSIS — R197 Diarrhea, unspecified: Secondary | ICD-10-CM

## 2016-07-29 DIAGNOSIS — R161 Splenomegaly, not elsewhere classified: Secondary | ICD-10-CM | POA: Diagnosis not present

## 2016-07-29 DIAGNOSIS — R61 Generalized hyperhidrosis: Secondary | ICD-10-CM

## 2016-07-29 DIAGNOSIS — Z801 Family history of malignant neoplasm of trachea, bronchus and lung: Secondary | ICD-10-CM | POA: Diagnosis not present

## 2016-07-29 DIAGNOSIS — Z79899 Other long term (current) drug therapy: Secondary | ICD-10-CM

## 2016-07-29 LAB — COMPREHENSIVE METABOLIC PANEL
ALT: 8 U/L (ref 0–55)
AST: 21 U/L (ref 5–34)
Albumin: 3.6 g/dL (ref 3.5–5.0)
Alkaline Phosphatase: 42 U/L (ref 40–150)
Anion Gap: 9 mEq/L (ref 3–11)
BUN: 18.8 mg/dL (ref 7.0–26.0)
CO2: 27 mEq/L (ref 22–29)
Calcium: 9.8 mg/dL (ref 8.4–10.4)
Chloride: 103 mEq/L (ref 98–109)
Creatinine: 0.9 mg/dL (ref 0.6–1.1)
EGFR: 66 mL/min/{1.73_m2} — ABNORMAL LOW (ref >90)
Glucose: 94 mg/dl (ref 70–140)
Potassium: 4 mEq/L (ref 3.5–5.1)
Sodium: 139 mEq/L (ref 136–145)
Total Bilirubin: 0.33 mg/dL (ref 0.20–1.20)
Total Protein: 7.4 g/dL (ref 6.4–8.3)

## 2016-07-29 LAB — CBC & DIFF AND RETIC
BASO%: 1.2 % (ref 0.0–2.0)
Basophils Absolute: 0.1 10*3/uL (ref 0.0–0.1)
EOS%: 2.9 % (ref 0.0–7.0)
Eosinophils Absolute: 0.2 10*3/uL (ref 0.0–0.5)
HCT: 39 % (ref 34.8–46.6)
HGB: 12.4 g/dL (ref 11.6–15.9)
Immature Retic Fract: 6.4 % (ref 1.60–10.00)
LYMPH%: 23.6 % (ref 14.0–49.7)
MCH: 29.7 pg (ref 25.1–34.0)
MCHC: 31.8 g/dL (ref 31.5–36.0)
MCV: 93.3 fL (ref 79.5–101.0)
MONO#: 0.8 10*3/uL (ref 0.1–0.9)
MONO%: 15.6 % — ABNORMAL HIGH (ref 0.0–14.0)
NEUT#: 2.9 10*3/uL (ref 1.5–6.5)
NEUT%: 56.7 % (ref 38.4–76.8)
Platelets: 174 10*3/uL (ref 145–400)
RBC: 4.18 10*6/uL (ref 3.70–5.45)
RDW: 14.5 % (ref 11.2–14.5)
Retic %: 1.09 % (ref 0.70–2.10)
Retic Ct Abs: 45.56 10*3/uL (ref 33.70–90.70)
WBC: 5.1 10*3/uL (ref 3.9–10.3)
lymph#: 1.2 10*3/uL (ref 0.9–3.3)

## 2016-07-29 LAB — LACTATE DEHYDROGENASE: LDH: 243 U/L (ref 125–245)

## 2016-07-29 LAB — TSH: TSH: 1.269 m(IU)/L (ref 0.308–3.960)

## 2016-07-29 NOTE — Patient Instructions (Addendum)
Patient instructions  1) please start taking B12 liquid Subligual 1000 micrograms daily over the counter + Bcomplex 1 tab/capsule po daily 2) maintain by mouth fluid intake of 48-60 ounces daily. 3) minimize caffeine intake. 4) could try to take vitamin E 400 units daily for a month to check to see if this helps your night sweats.  Thank you for choosing [facility] to provide your oncology and hematology care.  To afford each patient quality time with our providers, please arrive 30 minutes before your scheduled appointment time.  If you arrive late for your appointment, you may be asked to reschedule.  We strive to give you quality time with our providers, and arriving late affects you and other patients whose appointments are after yours.  If you are a no show for multiple scheduled visits, you may be dismissed from the clinic at the providers discretion.   Again, thank you for choosing [facility], our hope is that these requests will decrease the amount of time that you wait before being seen by our physicians.  ______________________________________________________________________ Should you have questions after your visit to the [facility], please contact our office at [phone] between the hours of 8:30 and 4:30 p.m.    Voicemails left after 4:30p.m will not be returned until the following business day.   For prescription refill requests, please have your pharmacy contact us directly.  Please also try to allow 48 hours for prescription requests.   Please contact the scheduling department for questions regarding scheduling.  For scheduling of procedures such as PET scans, CT scans, MRI, Ultrasound, etc please contact central scheduling at [phone].   Resources For Cancer Patients and Caregivers:  American Cancer Society:  ([phone] 429-3062  Can help patients locate various types of support and financial assistance Cancer Care: 1-800-813-HOPE  (305)561-2087) Provides financial assistance, online support groups, medication/co-pay assistance.   [place]:  681-579-1444 Where to apply for food stamps, Medicaid, and utility assistance Medicare Rights Center: 8672846812 Helps people with Medicare understand their rights and benefits, navigate the Medicare system, and secure the quality healthcare they deserve SCAT: [employer]'s shared-ride transportation service for eligible riders who have a disability that prevents them from riding the fixed route bus.   For additional information on assistance programs please contact our social worker:   Sharren [place]:  [phone]

## 2016-07-29 NOTE — Progress Notes (Signed)
  Oncology Nurse Navigator Documentation  Navigator Location: [facility] (07/29/16 1130)   )Navigator Encounter Type: Initial MedOnc (eval for diaphoresis) (07/29/16 1130)  Patient at [facility] for consultation with Dr. Irene Limbo.  Patient with history of lung cancer, followed by me as a participant in the [employer].  She reports that she has been experiencing excessive sweating without exertion.  After discussing with one of her physicians, she requested an evaluation by an oncologist to rule out a cancer related cause.  Patient denies any needs at this time.  I gave her my contact information again and asked her to call me for any questions or concerns.                   Patient Visit Type: MedOnc (07/29/16 1130) Treatment Phase: Other (07/29/16 1130) Barriers/Navigation Needs: No barriers at this time (07/29/16 1130)   Interventions: None required (07/29/16 1130)            Acuity: Level 1 (07/29/16 1130)         Time Spent with Patient: 15 (07/29/16 1130)

## 2016-07-29 NOTE — Progress Notes (Signed)
Marland Kitchen    HEMATOLOGY/ONCOLOGY CONSULTATION NOTE  Date of Service: 07/29/2016  Patient Care Team: Kelton Pillar, MD as PCP - General (Family Medicine) Lamonte Sakai Rose Fillers, MD as Consulting Physician (Pulmonary Disease) Hennie Duos, MD as Consulting Physician (Rheumatology)  CHIEF COMPLAINTS/PURPOSE OF CONSULTATION:  Excessive sweating r/o malignancy.  HISTORY OF PRESENTING ILLNESS:   Taylor Berry is a wonderful 76 y.o. female who has been referred to [place] by Dr .Kelton Pillar, MD for evaluation and management of excessive diaphoresis.  Patient has a h/o COPD, anxiety, GERD, Stage I lung s/p LUL wedge resection, OSA, uterine cancer s/p abd hysterectomy, Rheumatoid arthritis (followed by Dr Amil Amen). .  Patient notes that she has had excessive diaphoresis for the last 1-2 yrs. She notes no significant weight loss. No new SOB. No CP. No other specific focla symptoms. She was evaluated by endocrinology - thyroid wnl  And pheochromocytoma w/u was done and was noted to be unrevealing. We discussed that her symptoms could certainly be from her RA. CT chest wo contrast was done on 04/17/2016 and no findings to suggest local recurrence in LUQ or other malignancy.  MEDICAL HISTORY:  Past Medical History:  Diagnosis Date  . Allergic rhinitis   . Claustrophobia    Needs pillow beneath to lay flat-"gets choking feeling oherwise"  . Complication of anesthesia    Fentanyl allergy, "claustrophobia"  . COPD (chronic obstructive pulmonary disease) (HCC)    cervical cancer  . Cough with expectoration    coughs up phlegm early morning  . Diaphoresis 07/30/2015   Excessive sweating with little exertion  . GERD (gastroesophageal reflux disease)    In the past  . Hyperlipidemia   . Hypertension   . Left bundle branch block   . Lung cancer ([place])    left upper lobe wedge removed-no further tx.  . Neuropathy    Tingling in arms, fingers, and feet (Since 06/15/16)  . Obesity   . OSA  (obstructive sleep apnea)    denies.  . Osteoarthritis   . Osteoporosis   . Rheumatoid arthritis ([place])    Per pt questionnaire  . Rheumatoid arthritis(714.0)    Dr. Stark Jock follows  . Shortness of breath   . Uterine cancer [facility])     SURGICAL HISTORY: Past Surgical History:  Procedure Laterality Date  . ABDOMINAL HYSTERECTOMY     in situ carcinoma  . CARDIAC CATHETERIZATION  01/27/11   minor non-obs CAD, NL EF, mild pulm HTN  . CATARACT EXTRACTION, BILATERAL     last done 12'16-  . COLONOSCOPY W/ BIOPSIES AND POLYPECTOMY    . COLONOSCOPY WITH PROPOFOL N/A 02/13/2015   Procedure: COLONOSCOPY WITH PROPOFOL;  Surgeon: Laurence Spates, MD;  Location: [facility];  Service: Endoscopy;  Laterality: N/A;  . LYMPH NODE DISSECTION Left 04/12/2013   Procedure: LYMPH NODE DISSECTION;  Surgeon: [name], MD;  Location: [place];  Service: Thoracic;  Laterality: Left;  . TONSILLECTOMY    . VESICOVAGINAL FISTULA CLOSURE W/ TAH  1971  . VIDEO ASSISTED THORACOSCOPY (VATS)/THOROCOTOMY Left 04/12/2013   Procedure: VIDEO ASSISTED THORACOSCOPY (VATS)/THOROCOTOMY, WITH LEFT UPPER LOBE WEDGE RESECTION, CHEST WALL BIOPSY ;  Surgeon: [name], MD;  Location: [place];  Service: Thoracic;  Laterality: Left;  Marland Kitchen VIDEO BRONCHOSCOPY N/A 04/12/2013   Procedure: VIDEO BRONCHOSCOPY;  Surgeon: [name], MD;  Location: [place];  Service: Thoracic;  Laterality: N/A;    SOCIAL HISTORY: Social History   Social History  . Marital status: Divorced  Spouse name: N/A  . Number of children: 2  . 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 Smoking status: Former Smoker    Packs/day: 1.00    Years: 35.00    Types: Cigarettes    Quit date: 01/07/1988  . Smokeless tobacco: Never Used  . Alcohol use No     Comment: not since 1988  . Drug use: No  . Sexual activity: No   Other Topics Concern  . Not on file   Social History Narrative  .  No narrative on file    FAMILY HISTORY: Family History  Problem Relation Age of Onset  . Lung cancer Mother   . Coronary artery disease Son        MI at age 31  . Arthritis/Rheumatoid Maternal Grandmother   . Coronary artery disease Son   . Arthritis/Rheumatoid Son     ALLERGIES:  is allergic to latex; ace inhibitors; fentanyl; hydrochlorothiazide; and penicillins.  MEDICATIONS:  Current Outpatient Prescriptions  Medication Sig Dispense Refill  . aspirin EC 81 MG tablet Take 81 mg by mouth daily.    . Calcium-Magnesium-Vitamin D (CALCIUM 1200+D3 PO) Take by mouth daily.    Marland Kitchen denosumab (PROLIA) 60 MG/ML SOLN injection Inject 60 mg into the skin every 6 (six) months. Administer in upper arm, thigh, or abdomen    . hydroxychloroquine (PLAQUENIL) 200 MG tablet Take 200 mg by mouth 2 (two) times daily.     Marland Kitchen leflunomide (ARAVA) 20 MG tablet Take 20 mg by mouth daily.    . Omega-3 Fatty Acids (FISH OIL) 600 MG CAPS Take 600 mg by mouth every other day. Reported on 06/05/2015    . valsartan (DIOVAN) 80 MG tablet Take 80 mg by mouth daily.    Marland Kitchen venlafaxine XR (EFFEXOR-XR) 150 MG 24 hr capsule Take 150 mg by mouth daily.      No current facility-administered medications for this visit.     REVIEW OF SYSTEMS:    10 Point review of Systems was done is negative except as noted above.  PHYSICAL EXAMINATION: ECOG PERFORMANCE STATUS: 1 - Symptomatic but completely ambulatory  . Vitals:   07/29/16 1133  BP: 132/66  Pulse: 87  Resp: 20  Temp: 98.2 F (36.8 C)   Filed Weights   07/29/16 1133  Weight: 181 lb 4.8 oz (82.2 kg)   .Body mass index is 33.16 kg/m.  GENERAL:alert, in no acute distress and comfortable SKIN: no acute rashes, no significant lesions EYES: conjunctiva are pink and non-injected, sclera anicteric OROPHARYNX: MMM, no exudates, no oropharyngeal erythema or ulceration NECK: supple, no JVD LYMPH:  no palpable lymphadenopathy in the cervical, axillary or inguinal  regions LUNGS: clear to auscultation b/l with normal respiratory effort HEART: regular rate & rhythm ABDOMEN:  normoactive bowel sounds , non tender, not distended. Extremity: no pedal edema PSYCH: alert & oriented x 3 with fluent speech NEURO: no focal motor/sensory deficits  LABORATORY DATA:  I have reviewed the data as listed  . CBC Latest Ref Rng & Units 07/29/2016 04/27/2013 04/25/2013  WBC 3.9 - 10.3 10e3/uL 5.1 10.5 10.5  Hemoglobin 11.6 - 15.9 g/dL 12.4 9.4(L) 9.0(L)  Hematocrit 34.8 - 46.6 % 39.0 29.3(L) 27.6(L)  Platelets 145 - 400 10e3/uL 174 274 267   . CBC    Component Value Date/Time   WBC 5.1 07/29/2016 1306   WBC 10.5 04/27/2013 0520  RBC 4.18 07/29/2016 1306   RBC 3.20 (L) 04/27/2013 0520   HGB 12.4 07/29/2016 1306   HCT 39.0 07/29/2016 1306   PLT 174 07/29/2016 1306   MCV 93.3 07/29/2016 1306   MCH 29.7 07/29/2016 1306   MCH 29.4 04/27/2013 0520   MCHC 31.8 07/29/2016 1306   MCHC 32.1 04/27/2013 0520   RDW 14.5 07/29/2016 1306   LYMPHSABS 1.2 07/29/2016 1306   MONOABS 0.8 07/29/2016 1306   EOSABS 0.2 07/29/2016 1306   BASOSABS 0.1 07/29/2016 1306     . CMP Latest Ref Rng & Units 07/29/2016 05/06/2016 04/27/2013  Glucose 70 - 140 mg/dl 94 - 117(H)  BUN 7.0 - 26.0 mg/dL 18.8 - 11  Creatinine 0.6 - 1.1 mg/dL 0.9 - 0.49(L)  Sodium 136 - 145 mEq/L 139 - 135(L)  Potassium 3.5 - 5.1 mEq/L 4.0 - 4.5  Chloride 96 - 112 mEq/L - - 98  CO2 22 - 29 mEq/L 27 - 24  Calcium 8.4 - 10.4 mg/dL 9.8 - 9.0  Total Protein 6.4 - 8.3 g/dL 7.4 7.0 -  Total Bilirubin 0.20 - 1.20 mg/dL 0.33 0.4 -  Alkaline Phos 40 - 150 U/L 42 41 -  AST 5 - 34 U/L 21 20 -  ALT 0 - 55 U/L 8 8 -     RADIOGRAPHIC STUDIES: I have personally reviewed the radiological images as listed and agreed with the findings in the report. No results found.   CT chest 04/17/2016: IMPRESSION: 1. No findings to suggest local tumor recurrence in the left upper lobe status post wedge resection. Stable  small focus of loculated pleural air at the peripheral wedge resection site. 2. No evidence of metastatic disease in the chest. 3. Aortic atherosclerosis. Left main and two-vessel coronary atherosclerosis. 4. Mild emphysema.   ASSESSMENT & PLAN:   76 yo female with h/o stage I lung cancer about 5 yrs ago s/p LUQ wedge resection, previous uterine cancer  1) Excessive Diaphoresis. THis could very well be from her underlying autoimmune condition (RA) or treatment for this. - follows with Dr Amil Amen. Patient has seen endocrinology and had an endocrinology workup for this already- which was noted to be unrevealing. Neg for phaeochromocytoma. No focal symptoms reported that is suggestive of malignancy at this time. PLAN  - I discussed with the patient that the chances of her 1-2 yr old symptoms being from malignancy would be less likely. -she has no LNadenoapahy or hepatosplenomegaly to suggest a lymphoproliferative disease. -CT chest recently in 04/2016 was neg for malignancy -we shall get a CT abd/pelvis to r/o any acute intra-abd processes or tumors. -Tryptase, LDH levels -neuro-endocrine tumor markers - Chromogranin A , Neuron specific enolase. -Labs today CT abd/pelvis without contrast RTC with Dr Irene Limbo in 3 weeks with labs and CT abd/pelvis  . Orders Placed This Encounter  Procedures  . CT Abdomen Pelvis Wo Contrast    Standing Status:   Future    Number of Occurrences:   1    Standing Expiration Date:   07/29/2017    Order Specific Question:   Reason for Exam (SYMPTOM  OR DIAGNOSIS REQUIRED)    Answer:   night sweat and diarrhea r/o any  overt space occupying lesions/tumors.    Order Specific Question:   Preferred imaging location?    Answer:   [facility]    Order Specific Question:   Radiology Contrast Protocol - do NOT remove file path    Answer:   [email or web address]  .  CBC & Diff and Retic    Standing Status:   Future    Number of  Occurrences:   1    Standing Expiration Date:   07/29/2017  . Comprehensive metabolic panel    Standing Status:   Future    Number of Occurrences:   1    Standing Expiration Date:   07/29/2017  . Lactate dehydrogenase    Standing Status:   Future    Number of Occurrences:   1    Standing Expiration Date:   07/29/2017  . Sedimentation rate    Standing Status:   Future    Number of Occurrences:   1    Standing Expiration Date:   07/29/2017  . Vitamin B12    Standing Status:   Future    Number of Occurrences:   1    Standing Expiration Date:   07/29/2017  . Chromogranin A    Standing Status:   Future    Number of Occurrences:   1    Standing Expiration Date:   07/29/2017  . Neuron-specific enolase(NSE), blood    Standing Status:   Future    Number of Occurrences:   1    Standing Expiration Date:   09/02/2017  . Tryptase    Standing Status:   Future    Number of Occurrences:   1    Standing Expiration Date:   07/29/2017  . Thyroid Panel With TSH    Standing Status:   Future    Number of Occurrences:   1    Standing Expiration Date:   07/29/2017   2)  Patient Active Problem List   Diagnosis Date Noted  . Hyperhidrosis 06/18/2016  . Diaphoresis 06/05/2015  . Pneumothorax, left 08/31/2013  . Acute bronchitis 05/10/2013  . Anemia in neoplastic disease 05/02/2013  . Left Chest Wound infection 04/29/2013  . Acute blood loss anemia 04/29/2013  . Allergic rhinitis 04/29/2013  . Depression 04/29/2013  . Acute respiratory failure ([place]) 04/22/2013  . Lung cancer, left upper lobe 02/03/2013  . Pulmonary infiltrates 01/05/2013  . Mild pulmonary hypertension ([place]) 02/17/2011  . OSA (obstructive sleep apnea) 02/17/2011  . Dyspnea on exertion 11/22/2010  . COPD (chronic obstructive pulmonary disease) ([place]) 07/20/2009  . Carcinoma in situ of cervix uteri 06/26/2009  . PURE HYPERCHOLESTEROLEMIA 06/26/2009  . OBESITY 06/26/2009  . UNSPECIFIED ANEMIA 06/26/2009  . HYPERTENSION, BENIGN  06/26/2009  . ARTHRITIS, RHEUMATOID 06/26/2009  . OSTEOPOROSIS 06/26/2009  . Cough 06/26/2009   -continue mx of other medical co-morbidities as per PCP.'  Labs today CT abd/pelvis without contrast RTC with Dr Irene Limbo in 3 weeks with labs and CT abd/pelvis  All of the patients questions were answered with apparent satisfaction. The patient knows to call the clinic with any problems, questions or concerns.  I spent 40 minutes counseling the patient face to face. The total time spent in the appointment was 50 minutes and more than 50% was on counseling and direct patient cares.    Sullivan Lone MD [place] AAHIVMS [facility] Hematology/Oncology Physician [facility]  (Office):       [phone] (Work cell):  [phone] (Fax):           [phone]  07/29/2016 12:01 PM

## 2016-07-29 NOTE — Telephone Encounter (Signed)
Scheduled appt per 7/24 los - Gave patient AVS and calender per los. - Central radiology to contact patient with ct schedule.

## 2016-07-30 LAB — T4, FREE: T4,Free(Direct): 1.24 ng/dL (ref 0.82–1.77)

## 2016-07-30 LAB — T3 UPTAKE
Free Thyroxine Index: 1.9 (ref 1.2–4.9)
T3 Uptake Ratio: 25 % (ref 24–39)

## 2016-07-30 LAB — NEURON-SPECIFIC ENOLASE(NSE), BLOOD: Neuron-specific Enolase, Serum: 5.6 ng/mL (ref 0.0–12.5)

## 2016-07-30 LAB — TRYPTASE: Tryptase: 4.5 ug/L (ref 2.2–13.2)

## 2016-07-30 LAB — T4: Thyroxine (T4): 7.7 ug/dL (ref 4.5–12.0)

## 2016-07-30 LAB — VITAMIN B12: Vitamin B12: 257 pg/mL (ref 232–1245)

## 2016-07-30 LAB — SEDIMENTATION RATE: Sedimentation Rate-Westergren: 7 mm/hr (ref 0–40)

## 2016-07-31 LAB — CHROMOGRANIN A: Chromogranin A: 3 nmol/L (ref 0–5)

## 2016-08-08 ENCOUNTER — Encounter (HOSPITAL_COMMUNITY): Payer: Self-pay

## 2016-08-08 ENCOUNTER — Telehealth: Payer: Self-pay

## 2016-08-08 ENCOUNTER — Ambulatory Visit (HOSPITAL_COMMUNITY)
Admission: RE | Admit: 2016-08-08 | Discharge: 2016-08-08 | Disposition: A | Discharge status: Home / Self Care | Payer: PPO | Source: Ambulatory Visit | Attending: Hematology | Admitting: Hematology

## 2016-08-08 DIAGNOSIS — R61 Generalized hyperhidrosis: Secondary | ICD-10-CM

## 2016-08-08 DIAGNOSIS — K5730 Diverticulosis of large intestine without perforation or abscess without bleeding: Secondary | ICD-10-CM | POA: Insufficient documentation

## 2016-08-08 DIAGNOSIS — K429 Umbilical hernia without obstruction or gangrene: Secondary | ICD-10-CM | POA: Diagnosis not present

## 2016-08-08 DIAGNOSIS — R197 Diarrhea, unspecified: Secondary | ICD-10-CM | POA: Diagnosis not present

## 2016-08-08 DIAGNOSIS — I700 Atherosclerosis of aorta: Secondary | ICD-10-CM | POA: Diagnosis not present

## 2016-08-08 DIAGNOSIS — K8020 Calculus of gallbladder without cholecystitis without obstruction: Secondary | ICD-10-CM | POA: Diagnosis not present

## 2016-08-08 NOTE — Telephone Encounter (Signed)
Call report on CT abd/pelvis done today

## 2016-08-19 ENCOUNTER — Encounter: Payer: Self-pay | Admitting: Hematology

## 2016-08-19 ENCOUNTER — Ambulatory Visit (HOSPITAL_BASED_OUTPATIENT_CLINIC_OR_DEPARTMENT_OTHER): Payer: PPO | Admitting: Hematology

## 2016-08-19 VITALS — BP 134/80 | HR 88 | Temp 98.7°F | Resp 17 | Ht 62.0 in | Wt 180.5 lb

## 2016-08-19 DIAGNOSIS — R61 Generalized hyperhidrosis: Secondary | ICD-10-CM | POA: Diagnosis not present

## 2016-08-19 DIAGNOSIS — M069 Rheumatoid arthritis, unspecified: Secondary | ICD-10-CM

## 2016-08-19 DIAGNOSIS — Z85118 Personal history of other malignant neoplasm of bronchus and lung: Secondary | ICD-10-CM

## 2016-08-19 NOTE — Patient Instructions (Signed)
Thank you for choosing [facility] to provide your oncology and hematology care.  To afford each patient quality time with our providers, please arrive 30 minutes before your scheduled appointment time.  If you arrive late for your appointment, you may be asked to reschedule.  We strive to give you quality time with our providers, and arriving late affects you and other patients whose appointments are after yours.   If you are a no show for multiple scheduled visits, you may be dismissed from the clinic at the providers discretion.    Again, thank you for choosing [facility], our hope is that these requests will decrease the amount of time that you wait before being seen by our physicians.  ______________________________________________________________________  Should you have questions after your visit to the [facility], please contact our office at [phone] between the hours of 8:30 and 4:30 p.m.    Voicemails left after 4:30p.m will not be returned until the following business day.    For prescription refill requests, please have your pharmacy contact us directly.  Please also try to allow 48 hours for prescription requests.    Please contact the scheduling department for questions regarding scheduling.  For scheduling of procedures such as PET scans, CT scans, MRI, Ultrasound, etc please contact central scheduling at [phone].    Resources For Cancer Patients and Caregivers:   Oncolink.org:  A wonderful resource for patients and healthcare providers for information regarding your disease, ways to tract your treatment, what to expect, etc.     American Cancer Society:  800-227-2345  Can help patients locate various types of support and financial assistance  Cancer Care: 1-800-813-HOPE (4673) Provides financial assistance, online support groups, medication/co-pay assistance.    [place] DSS:  336-641-3447 Where to apply for food  stamps, Medicaid, and utility assistance  Medicare Rights Center: 800-333-4114 Helps people with Medicare understand their rights and benefits, navigate the Medicare system, and secure the quality healthcare they deserve  SCAT: 336-333-6589 [employer] [employer]'s shared-ride transportation service for eligible riders who have a disability that prevents them from riding the fixed route bus.    For additional information on assistance programs please contact our social worker:   Grier Hock/Abigail Elmore:  [phone]            

## 2016-08-25 NOTE — Progress Notes (Signed)
Marland Kitchen    HEMATOLOGY/ONCOLOGY CLINIC NOTE  Date of Service: .08/19/2016  Patient Care Team: Kelton Pillar, MD as PCP - General (Family Medicine) Lamonte Sakai Rose Fillers, MD as Consulting Physician (Pulmonary Disease) Hennie Duos, MD as Consulting Physician (Rheumatology)  CHIEF COMPLAINTS/PURPOSE OF CONSULTATION:  Excessive sweating r/o malignancy.  HISTORY OF PRESENTING ILLNESS:   Taylor Berry is a wonderful 76 y.o. female who has been referred to [place] by Dr .Kelton Pillar, MD for evaluation and management of excessive diaphoresis.  Patient has a h/o COPD, anxiety, GERD, Stage I lung s/p LUL wedge resection, OSA, uterine cancer s/p abd hysterectomy, Rheumatoid arthritis (followed by Dr Amil Amen). .  Patient notes that she has had excessive diaphoresis for the last 1-2 yrs. She notes no significant weight loss. No new SOB. No CP. No other specific focla symptoms. She was evaluated by endocrinology - thyroid wnl  And pheochromocytoma w/u was done and was noted to be unrevealing. We discussed that her symptoms could certainly be from her RA. CT chest wo contrast was done on 04/17/2016 and no findings to suggest local recurrence in LUQ or other malignancy.  INTERVAL HISTORY  Patient is here for followup to discuss the results of her work to r/o occult malignancy as an etiology for excessive diaphoresis. She notes no new focal symptoms. Weight has been stale since atleast 2 months.   MEDICAL HISTORY:  Past Medical History:  Diagnosis Date  . Allergic rhinitis   . Claustrophobia    Needs pillow beneath to lay flat-"gets choking feeling oherwise"  . Complication of anesthesia    Fentanyl allergy, "claustrophobia"  . COPD (chronic obstructive pulmonary disease) (HCC)    cervical cancer  . Cough with expectoration    coughs up phlegm early morning  . Diaphoresis 07/30/2015   Excessive sweating with little exertion  . GERD (gastroesophageal reflux disease)    In the past  .  Hyperlipidemia   . Hypertension   . Left bundle branch block   . Lung cancer ([place])    left upper lobe wedge removed-no further tx.  . Neuropathy    Tingling in arms, fingers, and feet (Since 06/15/16)  . Obesity   . OSA (obstructive sleep apnea)    denies.  . Osteoarthritis   . Osteoporosis   . Rheumatoid arthritis ([place])    Per pt questionnaire  . Rheumatoid arthritis(714.0)    Dr. Stark Jock follows  . Shortness of breath   . Uterine cancer [facility])     SURGICAL HISTORY: Past Surgical History:  Procedure Laterality Date  . ABDOMINAL HYSTERECTOMY     in situ carcinoma  . CARDIAC CATHETERIZATION  01/27/11   minor non-obs CAD, NL EF, mild pulm HTN  . CATARACT EXTRACTION, BILATERAL     last done 12'16-  . COLONOSCOPY W/ BIOPSIES AND POLYPECTOMY    . COLONOSCOPY WITH PROPOFOL N/A 02/13/2015   Procedure: COLONOSCOPY WITH PROPOFOL;  Surgeon: Laurence Spates, MD;  [facility];  Service: Endoscopy;  Laterality: N/A;  . LYMPH NODE DISSECTION Left 04/12/2013   Procedure: LYMPH NODE DISSECTION;  Surgeon: [name], MD;  Location: [place];  Service: Thoracic;  Laterality: Left;  . TONSILLECTOMY    . VESICOVAGINAL FISTULA CLOSURE W/ TAH  1971  . VIDEO ASSISTED THORACOSCOPY (VATS)/THOROCOTOMY Left 04/12/2013   Procedure: VIDEO ASSISTED THORACOSCOPY (VATS)/THOROCOTOMY, WITH LEFT UPPER LOBE WEDGE RESECTION, CHEST WALL BIOPSY ;  Surgeon: [name], MD;  Location: [place];  Service: Thoracic;  Laterality: Left;  .  VIDEO BRONCHOSCOPY N/A 04/12/2013   Procedure: VIDEO BRONCHOSCOPY;  Surgeon: [name], MD;  Location: [place];  Service: Thoracic;  Laterality: N/A;    SOCIAL HISTORY: Social History   Social History  . Marital status: Divorced    Spouse name: N/A  . Number of children: 2  . 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 Smoking status: Former Smoker    Packs/day: 1.00    Years: 35.00    Types:  Cigarettes    Quit date: 01/07/1988  . Smokeless tobacco: Never Used  . Alcohol use No     Comment: not since 1988  . Drug use: No  . Sexual activity: No   Other Topics Concern  . Not on file   Social History Narrative  . No narrative on file    FAMILY HISTORY: Family History  Problem Relation Age of Onset  . Lung cancer Mother   . Coronary artery disease Son        MI at age 64  . Arthritis/Rheumatoid Maternal Grandmother   . Coronary artery disease Son   . Arthritis/Rheumatoid Son     ALLERGIES:  is allergic to latex; ace inhibitors; fentanyl; hydrochlorothiazide; and penicillins.  MEDICATIONS:  Current Outpatient Prescriptions  Medication Sig Dispense Refill  . aspirin EC 81 MG tablet Take 81 mg by mouth daily.    . Calcium-Magnesium-Vitamin D (CALCIUM 1200+D3 PO) Take by mouth daily.    Marland Kitchen denosumab (PROLIA) 60 MG/ML SOLN injection Inject 60 mg into the skin every 6 (six) months. Administer in upper arm, thigh, or abdomen    . hydroxychloroquine (PLAQUENIL) 200 MG tablet Take 200 mg by mouth 2 (two) times daily.     Marland Kitchen leflunomide (ARAVA) 20 MG tablet Take 20 mg by mouth daily.    Marland Kitchen losartan (COZAAR) 100 MG tablet Take 180 mg by mouth daily.    . Omega-3 Fatty Acids (FISH OIL) 600 MG CAPS Take 600 mg by mouth every other day. Reported on 06/05/2015    . venlafaxine XR (EFFEXOR-XR) 150 MG 24 hr capsule Take 150 mg by mouth daily.      No current facility-administered medications for this visit.     REVIEW OF SYSTEMS:    10 Point review of Systems was done is negative except as noted above.  PHYSICAL EXAMINATION: ECOG PERFORMANCE STATUS: 1 - Symptomatic but completely ambulatory  . Vitals:   08/19/16 0855  BP: 134/80  Pulse: 88  Resp: 17  Temp: 98.7 F (37.1 C)  SpO2: 95%   Filed Weights   08/19/16 0855  Weight: 180 lb 8 oz (81.9 kg)   .Body mass index is 33.01 kg/m.  . Wt Readings from Last 3 Encounters:  08/19/16 180 lb 8 oz (81.9 kg)  07/29/16 181  lb 4.8 oz (82.2 kg)  06/10/16 180 lb 6.4 oz (81.8 kg)    GENERAL:alert, in no acute distress and comfortable SKIN: no acute rashes, no significant lesions EYES: conjunctiva are pink and non-injected, sclera anicteric OROPHARYNX: MMM, no exudates, no oropharyngeal erythema or ulceration NECK: supple, no JVD LYMPH:  no palpable lymphadenopathy in the cervical, axillary or inguinal regions LUNGS: clear to auscultation b/l with normal respiratory effort HEART: regular rate & rhythm ABDOMEN:  normoactive bowel sounds , non tender, not distended. Extremity: no pedal edema PSYCH: alert & oriented x 3 with fluent speech NEURO: no focal motor/sensory deficits  LABORATORY DATA:  I have reviewed the data as listed  . CBC Latest Ref Rng & Units 07/29/2016 04/27/2013 04/25/2013  WBC 3.9 - 10.3 10e3/uL 5.1 10.5 10.5  Hemoglobin 11.6 - 15.9 g/dL 12.4 9.4(L) 9.0(L)  Hematocrit 34.8 - 46.6 % 39.0 29.3(L) 27.6(L)  Platelets 145 - 400 10e3/uL 174 274 267   . CBC    Component Value Date/Time   WBC 5.1 07/29/2016 1306   WBC 10.5 04/27/2013 0520   RBC 4.18 07/29/2016 1306   RBC 3.20 (L) 04/27/2013 0520   HGB 12.4 07/29/2016 1306   HCT 39.0 07/29/2016 1306   PLT 174 07/29/2016 1306   MCV 93.3 07/29/2016 1306   MCH 29.7 07/29/2016 1306   MCH 29.4 04/27/2013 0520   MCHC 31.8 07/29/2016 1306   MCHC 32.1 04/27/2013 0520   RDW 14.5 07/29/2016 1306   LYMPHSABS 1.2 07/29/2016 1306   MONOABS 0.8 07/29/2016 1306   EOSABS 0.2 07/29/2016 1306   BASOSABS 0.1 07/29/2016 1306     . CMP Latest Ref Rng & Units 07/29/2016 05/06/2016 04/27/2013  Glucose 70 - 140 mg/dl 94 - 117(H)  BUN 7.0 - 26.0 mg/dL 18.8 - 11  Creatinine 0.6 - 1.1 mg/dL 0.9 - 0.49(L)  Sodium 136 - 145 mEq/L 139 - 135(L)  Potassium 3.5 - 5.1 mEq/L 4.0 - 4.5  Chloride 96 - 112 mEq/L - - 98  CO2 22 - 29 mEq/L 27 - 24  Calcium 8.4 - 10.4 mg/dL 9.8 - 9.0  Total Protein 6.4 - 8.3 g/dL 7.4 7.0 -  Total Bilirubin 0.20 - 1.20 mg/dL 0.33 0.4 -   Alkaline Phos 40 - 150 U/L 42 41 -  AST 5 - 34 U/L 21 20 -  ALT 0 - 55 U/L 8 8 -   Component     Latest Ref Rng & Units 07/29/2016  T3 Uptake Ratio     24 - 39 % 25  Free Thyroxine Index     1.2 - 4.9 1.9  LDH     125 - 245 U/L 243  Sed Rate     0 - 40 mm/hr 7  Vitamin B12     232 - 1,245 pg/mL 257  Chromogranin A     0 - 5 nmol/L 3  Neuron-specific Enolase, Serum     0.0 - 12.5 ng/mL 5.6  Tryptase     2.2 - 13.2 ug/L 4.5  Thyroxine (T4)     4.5 - 12.0 ug/dL 7.7  T4,Free(Direct)     0.82 - 1.77 ng/dL 1.24  TSH     0.308 - 3.960 m(IU)/L 1.269    RADIOGRAPHIC STUDIES: I have personally reviewed the radiological images as listed and agreed with the findings in the report. Ct Abdomen Pelvis Wo Contrast  Result Date: 08/08/2016 CLINICAL DATA:  Night sweats and diarrhea. History of left upper lobe lung cancer status post wedge resection. EXAM: CT ABDOMEN AND PELVIS WITHOUT CONTRAST TECHNIQUE: Multidetector CT imaging of the abdomen and pelvis was performed following the standard protocol without IV contrast. COMPARISON:  PET-CT 02/17/2013. FINDINGS: Lower chest: The heart size is normal. No pericardial effusion. Coronary artery calcification is noted. Atelectasis or scarring noted medial right middle lobe. Areas of bronchial wall thickening and bronchiectasis are seen in the lower lungs bilaterally with subpleural scarring. Hepatobiliary: 9 mm low-density lesion in the dome of the left liver is stable. Small layering stones are seen in the gallbladder. No intrahepatic or extrahepatic biliary dilation. Pancreas: No focal mass lesion.  No dilatation of the main duct. No intraparenchymal cyst. No peripancreatic edema. Spleen: No splenomegaly. No focal mass lesion. Adrenals/Urinary Tract: No adrenal nodule or mass. No gross renal mass lesion evident on this noncontrast exam. Tiny 1-2 mm nonobstructing stone identified lower pole left kidney. No hydroureteronephrosis. The urinary bladder  appears normal for the degree of distention. Stomach/Bowel: Gastric contour is somewhat unusual with poor mixing of contrast in 1 area along the proximal greater curvature. While a similar contour was seen on the CT chest from 4 months ago, this contour is new since exam from 2015. No evidence of outlet obstruction. Duodenum is normally positioned as is the ligament of Treitz. No small bowel wall thickening. No small bowel dilatation. The terminal ileum is normal. The appendix is normal. Diverticuli are seen scattered along the entire length of the colon without CT findings of diverticulitis. Vascular/Lymphatic: There is abdominal aortic atherosclerosis without aneurysm. There is no gastrohepatic or hepatoduodenal ligament lymphadenopathy. No intraperitoneal or retroperitoneal lymphadenopathy. No pelvic sidewall lymphadenopathy. Reproductive: Uterus surgically absent.  There is no adnexal mass. Other: No intraperitoneal free fluid. Musculoskeletal: Bone windows reveal no worrisome lytic or sclerotic osseous lesions. Small left paraumbilical hernia contains only fat and is similar to prior. IMPRESSION: 1. Unusual gastric contour with a question of focal gastric wall thickening, new since PET-CT of 02/17/2013. No perigastric edema or inflammation. Upper endoscopy may be indicated to exclude neoplasm. 2. Cholelithiasis. 3. Diffuse colonic diverticulosis without diverticulitis. 4. Small left paraumbilical hernia contains only fat. 5.  Aortic Atherosclerois (ICD10-170.0) These results will be called to the ordering clinician or representative by the Radiologist Assistant, and communication documented in the PACS or zVision Dashboard. Electronically Signed   By: Misty Stanley M.D.   On: 08/08/2016 14:56     CT chest 04/17/2016: IMPRESSION: 1. No findings to suggest local tumor recurrence in the left upper lobe status post wedge resection. Stable small focus of loculated pleural air at the peripheral wedge resection  site. 2. No evidence of metastatic disease in the chest. 3. Aortic atherosclerosis. Left main and two-vessel coronary atherosclerosis. 4. Mild emphysema.   ASSESSMENT & PLAN:   76 yo female with h/o stage I lung cancer about 5 yrs ago s/p LUQ wedge resection, previous uterine cancer  1) Excessive Diaphoresis. THis could very well be from her underlying autoimmune condition (RA) or treatment for this. - follows with Dr Amil Amen. Patient has seen endocrinology and had an endocrinology workup for this already- which was noted to be unrevealing. Neg for phaeochromocytoma. No focal symptoms reported that is suggestive of malignancy at this time. PLAN Workup done and reviewed and discussed in details with the patient. TFT' WNL, CT abd/pelvis -no evidence of tumor. -Tryptase, LDH levels - WNL -neuro-endocrine tumor markers - Chromogranin A , Neuron specific enolase.- WNL - no evidence of carcinoid tumor. -f/u with PCP for age appropriate cancer screening. -no additional w/u recommended from an oncology standpoint at this time. -f/u with PCP and rheumatology to consider RA and its treatment as an etiology of her excessive diaphoresis.  -patient is already on SSRI -f/u with PCP to r/o other chronic infectious issues (in light on biologics and immune suppressive state ) and r/o cardiac issues, etc.  . 2)  Patient Active Problem List   Diagnosis Date Noted  . Hyperhidrosis 06/18/2016  . Diaphoresis 06/05/2015  . Pneumothorax, left 08/31/2013  . Acute bronchitis 05/10/2013  . Anemia in neoplastic disease 05/02/2013  . Left Chest Wound infection 04/29/2013  .  Acute blood loss anemia 04/29/2013  . Allergic rhinitis 04/29/2013  . Depression 04/29/2013  . Acute respiratory failure ([place]) 04/22/2013  . Lung cancer, left upper lobe 02/03/2013  . Pulmonary infiltrates 01/05/2013  . Mild pulmonary hypertension ([place]) 02/17/2011  . OSA (obstructive sleep apnea) 02/17/2011  . Dyspnea on exertion  11/22/2010  . COPD (chronic obstructive pulmonary disease) ([place]) 07/20/2009  . Carcinoma in situ of cervix uteri 06/26/2009  . PURE HYPERCHOLESTEROLEMIA 06/26/2009  . OBESITY 06/26/2009  . UNSPECIFIED ANEMIA 06/26/2009  . HYPERTENSION, BENIGN 06/26/2009  . ARTHRITIS, RHEUMATOID 06/26/2009  . OSTEOPOROSIS 06/26/2009  . Cough 06/26/2009   -continue mx of other medical co-morbidities as per PCP.'  RTC with Dr Irene Limbo on an as needed basis.  All of the patients questions were answered with apparent satisfaction. The patient knows to call the clinic with any problems, questions or concerns.  I spent 20 minutes counseling the patient face to face. The total time spent in the appointment was 20  minutes and more than 50% was on counseling and direct patient cares.    Sullivan Lone MD [place] AAHIVMS [facility] Hematology/Oncology Physician [facility]  (Office):       [phone] (Work cell):  [phone] (Fax):           [phone]

## 2016-09-03 ENCOUNTER — Other Ambulatory Visit: Payer: Self-pay | Admitting: Gastroenterology

## 2016-09-03 DIAGNOSIS — Z85118 Personal history of other malignant neoplasm of bronchus and lung: Secondary | ICD-10-CM | POA: Diagnosis not present

## 2016-09-03 DIAGNOSIS — M069 Rheumatoid arthritis, unspecified: Secondary | ICD-10-CM | POA: Diagnosis not present

## 2016-09-03 DIAGNOSIS — G4733 Obstructive sleep apnea (adult) (pediatric): Secondary | ICD-10-CM | POA: Diagnosis not present

## 2016-09-03 DIAGNOSIS — R933 Abnormal findings on diagnostic imaging of other parts of digestive tract: Secondary | ICD-10-CM | POA: Diagnosis not present

## 2016-09-03 DIAGNOSIS — Z86010 Personal history of colonic polyps: Secondary | ICD-10-CM | POA: Diagnosis not present

## 2016-09-09 NOTE — Progress Notes (Signed)
Douglass Rivers, MD Reason for referral-Hyperlipidemia and dyspnea  HPI: 76 yo female for evaluation of hyperlipidemia and dyspnea at request of Kelton Pillar, MD. I have seen previously but not since 1/13. H/O dyspnea; also with h/o lung ca, COPD and OSA. Echocardiogram in December of 2012 showed an ejection fraction of 99991111, grade 1 diastolic dysfunction and mild left atrial enlargement. Cardiac cath 1/13 showed PA 40/16, PCWP 13, EF 55-65, no significant CAD. Chest CT 4/18 showed no recurrence of lung ca, LM and 2 vessel atherosclerosis. Patient describes dyspnea with more moderate activities but not routine activities. No orthopnea, PND, pedal edema or chest pain. She also complains of diaphoresis with activities that has increased in severity. Because of the above we were asked to evaluate. Note TSH 07/29/2016 1.269.   Current Outpatient Prescriptions  Medication Sig Dispense Refill  . aspirin EC 81 MG tablet Take 81 mg by mouth daily.    . Calcium-Magnesium-Vitamin D (CALCIUM 1200+D3 PO) Take by mouth daily.    Marland Kitchen denosumab (PROLIA) 60 MG/ML SOLN injection Inject 60 mg into the skin every 6 (six) months. Administer in upper arm, thigh, or abdomen    . hydroxychloroquine (PLAQUENIL) 200 MG tablet Take 200 mg by mouth 2 (two) times daily.     Marland Kitchen leflunomide (ARAVA) 20 MG tablet Take 20 mg by mouth daily.    Marland Kitchen losartan (COZAAR) 100 MG tablet Take 180 mg by mouth daily.    . Omega-3 Fatty Acids (FISH OIL) 600 MG CAPS Take 600 mg by mouth every other day. Reported on 06/05/2015    . venlafaxine XR (EFFEXOR-XR) 150 MG 24 hr capsule Take 150 mg by mouth daily.      No current facility-administered medications for this visit.     Allergies  Allergen Reactions  . Latex     REACTION: itching  . Ace Inhibitors     REACTION: cough  . Fentanyl     Hallucination "I go crazy"   . Hydrochlorothiazide     REACTION: decreased sodium  . Penicillins Other (See Comments)    REACTION:  intolerant due to yeast infection Has patient had a PCN reaction causing immediate rash, facial/tongue/throat swelling, SOB or lightheadedness with hypotension:No Has patient had a PCN reaction causing severe rash involving mucus membranes or skin necrosis: No Has patient had a PCN reaction that required hospitalization No Has patient had a PCN reaction occurring within the last 10 years: Yes If all of the above answers are "NO", then may proceed with Cephalosporin use.      Past Medical History:  Diagnosis Date  . Allergic rhinitis   . Claustrophobia    Needs pillow beneath to lay flat-"gets choking feeling oherwise"  . Complication of anesthesia    Fentanyl allergy, "claustrophobia"  . COPD (chronic obstructive pulmonary disease) (HCC)    cervical cancer  . Cough with expectoration    coughs up phlegm early morning  . Diaphoresis 07/30/2015   Excessive sweating with little exertion  . GERD (gastroesophageal reflux disease)    In the past  . Hyperlipidemia   . Hypertension   . Left bundle branch block   . Lung cancer ([place])    left upper lobe wedge removed-no further tx.  . Neuropathy    Tingling in arms, fingers, and feet (Since 06/15/16)  . Obesity   . OSA (obstructive sleep apnea)    denies.  . Osteoarthritis   . Osteoporosis   . Rheumatoid arthritis(714.0)    Dr.  Beatmon follows  . Shortness of breath   . Uterine cancer [facility])     Past Surgical History:  Procedure Laterality Date  . ABDOMINAL HYSTERECTOMY     in situ carcinoma  . CARDIAC CATHETERIZATION  01/27/11   minor non-obs CAD, NL EF, mild pulm HTN  . CATARACT EXTRACTION, BILATERAL     last done 12'16-  . COLONOSCOPY W/ BIOPSIES AND POLYPECTOMY    . COLONOSCOPY WITH PROPOFOL N/A 02/13/2015   Procedure: COLONOSCOPY WITH PROPOFOL;  Surgeon: Laurence Spates, MD;  [facility];  Service: Endoscopy;  Laterality: N/A;  . LYMPH NODE DISSECTION Left 04/12/2013   Procedure: LYMPH NODE DISSECTION;  Surgeon:  [name], MD;  Location: [place];  Service: Thoracic;  Laterality: Left;  . TONSILLECTOMY    . VESICOVAGINAL FISTULA CLOSURE W/ TAH  1971  . VIDEO ASSISTED THORACOSCOPY (VATS)/THOROCOTOMY Left 04/12/2013   Procedure: VIDEO ASSISTED THORACOSCOPY (VATS)/THOROCOTOMY, WITH LEFT UPPER LOBE WEDGE RESECTION, CHEST WALL BIOPSY ;  Surgeon: [name], MD;  Location: [place];  Service: Thoracic;  Laterality: Left;  Marland Kitchen VIDEO BRONCHOSCOPY N/A 04/12/2013   Procedure: VIDEO BRONCHOSCOPY;  Surgeon: [name], MD;  Location: [facility];  Service: Thoracic;  Laterality: N/A;    Social History   Social History  . Marital status: Widowed    Spouse name: N/A  . Number of children: 2  . 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 Smoking status: Former Smoker    Packs/day: 1.00    Years: 35.00    Types: Cigarettes    Quit date: 01/07/1988  . Smokeless tobacco: Never Used  . Alcohol use No     Comment: not since 1988  . Drug use: No  . Sexual activity: No   Other Topics Concern  . Not on file   Social History Narrative  . No narrative on file    Family History  Problem Relation Age of Onset  . Lung cancer Mother   . Coronary artery disease Son        MI at age 57  . Arthritis/Rheumatoid Maternal Grandmother   . Coronary artery disease Son   . Arthritis/Rheumatoid Son     ROS: Diaphoresis but no fevers or chills, productive cough, hemoptysis, dysphasia, odynophagia, melena, hematochezia, dysuria, hematuria, rash, seizure activity, orthopnea, PND, pedal edema, claudication. Remaining systems are negative.  Physical Exam:   Blood pressure 124/86, pulse 86, height '5\' 2"'$  (1.575 m), weight 81.6 kg (180 lb).  General:  Well developed/well nourished in NAD Skin warm/dry Patient not depressed No peripheral clubbing Back-normal HEENT-normal/normal eyelids Neck supple/normal carotid upstroke bilaterally; no bruits; no JVD;  no thyromegaly chest - Diminished BS with mild inspiratory wheeze CV - RRR/normal S1 and S2; no murmurs, rubs or gallops;  PMI nondisplaced Abdomen -NT/ND, no HSM, no mass, + bowel sounds, no bruit 2+ femoral pulses, no bruits Ext-no edema, chords, 2+ DP Neuro-grossly nonfocal  ECG - Sinus rhythm at a rate of 86. Left axis deviation. IVCD. Cannot rule out prior septal infarct. personally reviewed  A/P  1 dyspnea-patient describes dyspnea on exertion which is somewhat chronic. She is more concerned about diaphoresis with exertion. She has not had chest pain. Previous catheterization revealed no obstructive coronary disease. Recent CT scan did show coronary calcification. I will arrange a [place] nuclear study to screen for significant coronary artery disease.  2 coronary calcification/coronary disease-continue  aspirin 81 mg daily. Add Lipitor 20 mg daily. Check lipids and liver in 4 weeks.  3 hypertension-blood pressure is controlled. Continue present medications.  4 dyspnea-likely multifactorial including history of lung cancer that is post lung resection, COPD, obstructive sleep apnea and obesity hypoventilation syndrome. Nuclear study to screen for significant coronary disease.  Kirk Ruths, MD

## 2016-09-15 ENCOUNTER — Encounter: Payer: Self-pay | Admitting: Cardiology

## 2016-09-15 ENCOUNTER — Ambulatory Visit (INDEPENDENT_AMBULATORY_CARE_PROVIDER_SITE_OTHER): Payer: PPO | Admitting: Cardiology

## 2016-09-15 VITALS — BP 124/86 | HR 86 | Ht 62.0 in | Wt 180.0 lb

## 2016-09-15 DIAGNOSIS — R0602 Shortness of breath: Secondary | ICD-10-CM | POA: Diagnosis not present

## 2016-09-15 DIAGNOSIS — I10 Essential (primary) hypertension: Secondary | ICD-10-CM

## 2016-09-15 DIAGNOSIS — E7800 Pure hypercholesterolemia, unspecified: Secondary | ICD-10-CM

## 2016-09-15 MED ORDER — ATORVASTATIN CALCIUM 20 MG PO TABS: 20.0000 mg | ORAL_TABLET | Freq: Every day | ORAL | 3 refills | Status: DC

## 2016-09-15 NOTE — Patient Instructions (Signed)
Medication Instructions:   START ATORVASTATIN 20 MG ONCE DAILY  Labwork:  Your physician recommends that you return for lab work in: 4 WEEKS= DO NOT EAT PRIOR TO LAB WORK  Testing/Procedures:  Your physician has requested that you have a lexiscan myoview. For further information please visit [email or web address]. Please follow instruction sheet, as given.    Follow-Up:  Your physician wants you to follow-up in: [place] will receive a reminder letter in the mail two months in advance. If you don't receive a letter, please call our office to schedule the follow-up appointment.   If you need a refill on your cardiac medications before your next appointment, please call your pharmacy.

## 2016-09-17 ENCOUNTER — Telehealth (HOSPITAL_COMMUNITY): Payer: Self-pay

## 2016-09-17 NOTE — Telephone Encounter (Signed)
Encounter complete. 

## 2016-09-19 ENCOUNTER — Ambulatory Visit (HOSPITAL_COMMUNITY)
Admission: RE | Admit: 2016-09-19 | Payer: PPO | Source: Ambulatory Visit | Attending: Cardiology | Admitting: Cardiology

## 2016-09-22 ENCOUNTER — Telehealth: Payer: Self-pay | Admitting: *Deleted

## 2016-09-22 NOTE — Telephone Encounter (Signed)
Pt called stating certain labs from 4/12 visit with Dr. Irene Limbo were not covered under provided diagnosis code.  Staff message sent to Alinda Dooms to follow up on coding.

## 2016-09-23 DIAGNOSIS — M0589 Other rheumatoid arthritis with rheumatoid factor of multiple sites: Secondary | ICD-10-CM | POA: Diagnosis not present

## 2016-09-23 DIAGNOSIS — R29898 Other symptoms and signs involving the musculoskeletal system: Secondary | ICD-10-CM | POA: Diagnosis not present

## 2016-09-23 DIAGNOSIS — Z79899 Other long term (current) drug therapy: Secondary | ICD-10-CM | POA: Diagnosis not present

## 2016-09-23 DIAGNOSIS — R0602 Shortness of breath: Secondary | ICD-10-CM | POA: Diagnosis not present

## 2016-09-23 DIAGNOSIS — M810 Age-related osteoporosis without current pathological fracture: Secondary | ICD-10-CM | POA: Diagnosis not present

## 2016-09-23 DIAGNOSIS — C3490 Malignant neoplasm of unspecified part of unspecified bronchus or lung: Secondary | ICD-10-CM | POA: Diagnosis not present

## 2016-09-23 DIAGNOSIS — R232 Flushing: Secondary | ICD-10-CM | POA: Diagnosis not present

## 2016-09-23 DIAGNOSIS — M150 Primary generalized (osteo)arthritis: Secondary | ICD-10-CM | POA: Diagnosis not present

## 2016-09-23 DIAGNOSIS — E669 Obesity, unspecified: Secondary | ICD-10-CM | POA: Diagnosis not present

## 2016-09-23 DIAGNOSIS — Z6831 Body mass index (BMI) 31.0-31.9, adult: Secondary | ICD-10-CM | POA: Diagnosis not present

## 2016-09-24 ENCOUNTER — Telehealth (HOSPITAL_COMMUNITY): Payer: Self-pay

## 2016-09-24 NOTE — Telephone Encounter (Signed)
Encounter complete. 

## 2016-09-26 ENCOUNTER — Ambulatory Visit (HOSPITAL_COMMUNITY)
Admission: RE | Admit: 2016-09-26 | Discharge: 2016-09-26 | Disposition: A | Discharge status: Home / Self Care | Payer: PPO | Source: Ambulatory Visit | Attending: Cardiovascular Disease | Admitting: Cardiovascular Disease

## 2016-09-26 ENCOUNTER — Encounter (HOSPITAL_COMMUNITY): Payer: Self-pay | Admitting: *Deleted

## 2016-09-26 DIAGNOSIS — R0602 Shortness of breath: Secondary | ICD-10-CM | POA: Insufficient documentation

## 2016-09-26 LAB — MYOCARDIAL PERFUSION IMAGING
LV dias vol: 136 mL (ref 46–106)
LV sys vol: 93 mL
Peak HR: 94 {beats}/min
Rest HR: 86 {beats}/min
SDS: 4
SRS: 1
SSS: 5
TID: 1.02

## 2016-09-26 MED ORDER — TECHNETIUM TC 99M TETROFOSMIN IV KIT
31.0000 | PACK | Freq: Once | INTRAVENOUS | Status: AC | PRN
  Administered 2016-09-26: 31 via INTRAVENOUS
  Filled 2016-09-26: qty 31

## 2016-09-26 MED ORDER — TECHNETIUM TC 99M TETROFOSMIN IV KIT
10.9000 | PACK | Freq: Once | INTRAVENOUS | Status: AC | PRN
  Administered 2016-09-26: 10.9 via INTRAVENOUS
  Filled 2016-09-26: qty 11

## 2016-09-26 MED ORDER — REGADENOSON 0.4 MG/5ML IV SOLN
0.4000 mg | Freq: Once | INTRAVENOUS | Status: AC
  Administered 2016-09-26: 0.4 mg via INTRAVENOUS

## 2016-09-29 ENCOUNTER — Telehealth: Payer: Self-pay | Admitting: *Deleted

## 2016-09-29 ENCOUNTER — Encounter (HOSPITAL_COMMUNITY): Payer: Self-pay | Admitting: *Deleted

## 2016-09-29 DIAGNOSIS — R9439 Abnormal result of other cardiovascular function study: Secondary | ICD-10-CM

## 2016-09-29 NOTE — Telephone Encounter (Addendum)
-----   Message from [name], MD sent at 09/26/2016  3:12 PM EDT ----- Please arrange echo to assess LV function (may be inaccurate on nuclear study); no ischemia or infarction Kirk Ruths   pt aware of results  Follow up scheduled for echo

## 2016-09-30 ENCOUNTER — Ambulatory Visit (HOSPITAL_COMMUNITY): Payer: PPO | Admitting: Anesthesiology

## 2016-09-30 ENCOUNTER — Ambulatory Visit (HOSPITAL_COMMUNITY)
Admission: RE | Admit: 2016-09-30 | Discharge: 2016-09-30 | Disposition: A | Discharge status: Home / Self Care | Payer: PPO | Source: Ambulatory Visit | Attending: Gastroenterology | Admitting: Gastroenterology

## 2016-09-30 ENCOUNTER — Encounter (HOSPITAL_COMMUNITY): Payer: Self-pay | Admitting: Certified Registered Nurse Anesthetist

## 2016-09-30 ENCOUNTER — Encounter (HOSPITAL_COMMUNITY)
Admission: RE | Disposition: A | Discharge status: Home / Self Care | Payer: Self-pay | Source: Ambulatory Visit | Attending: Gastroenterology

## 2016-09-30 DIAGNOSIS — F329 Major depressive disorder, single episode, unspecified: Secondary | ICD-10-CM | POA: Insufficient documentation

## 2016-09-30 DIAGNOSIS — Z79899 Other long term (current) drug therapy: Secondary | ICD-10-CM | POA: Insufficient documentation

## 2016-09-30 DIAGNOSIS — Z8542 Personal history of malignant neoplasm of other parts of uterus: Secondary | ICD-10-CM | POA: Diagnosis not present

## 2016-09-30 DIAGNOSIS — Z7982 Long term (current) use of aspirin: Secondary | ICD-10-CM | POA: Diagnosis not present

## 2016-09-30 DIAGNOSIS — E785 Hyperlipidemia, unspecified: Secondary | ICD-10-CM | POA: Insufficient documentation

## 2016-09-30 DIAGNOSIS — I10 Essential (primary) hypertension: Secondary | ICD-10-CM | POA: Diagnosis not present

## 2016-09-30 DIAGNOSIS — J449 Chronic obstructive pulmonary disease, unspecified: Secondary | ICD-10-CM | POA: Diagnosis not present

## 2016-09-30 DIAGNOSIS — Z85118 Personal history of other malignant neoplasm of bronchus and lung: Secondary | ICD-10-CM | POA: Diagnosis not present

## 2016-09-30 DIAGNOSIS — K219 Gastro-esophageal reflux disease without esophagitis: Secondary | ICD-10-CM | POA: Diagnosis not present

## 2016-09-30 DIAGNOSIS — Z87891 Personal history of nicotine dependence: Secondary | ICD-10-CM | POA: Insufficient documentation

## 2016-09-30 DIAGNOSIS — I447 Left bundle-branch block, unspecified: Secondary | ICD-10-CM | POA: Insufficient documentation

## 2016-09-30 DIAGNOSIS — R933 Abnormal findings on diagnostic imaging of other parts of digestive tract: Secondary | ICD-10-CM | POA: Diagnosis not present

## 2016-09-30 DIAGNOSIS — K3189 Other diseases of stomach and duodenum: Secondary | ICD-10-CM | POA: Insufficient documentation

## 2016-09-30 DIAGNOSIS — M069 Rheumatoid arthritis, unspecified: Secondary | ICD-10-CM | POA: Diagnosis not present

## 2016-09-30 DIAGNOSIS — K449 Diaphragmatic hernia without obstruction or gangrene: Secondary | ICD-10-CM | POA: Diagnosis not present

## 2016-09-30 DIAGNOSIS — G4733 Obstructive sleep apnea (adult) (pediatric): Secondary | ICD-10-CM | POA: Diagnosis not present

## 2016-09-30 DIAGNOSIS — Z86010 Personal history of colonic polyps: Secondary | ICD-10-CM | POA: Diagnosis not present

## 2016-09-30 DIAGNOSIS — E7800 Pure hypercholesterolemia, unspecified: Secondary | ICD-10-CM | POA: Diagnosis not present

## 2016-09-30 DIAGNOSIS — J9600 Acute respiratory failure, unspecified whether with hypoxia or hypercapnia: Secondary | ICD-10-CM

## 2016-09-30 DIAGNOSIS — K2980 Duodenitis without bleeding: Secondary | ICD-10-CM | POA: Diagnosis not present

## 2016-09-30 HISTORY — DX: Other specified health status: Z78.9

## 2016-09-30 HISTORY — DX: Major depressive disorder, single episode, unspecified: F32.9

## 2016-09-30 HISTORY — DX: Shortness of breath: R06.02

## 2016-09-30 HISTORY — PX: ESOPHAGOGASTRODUODENOSCOPY (EGD) WITH PROPOFOL: SHX5813

## 2016-09-30 HISTORY — DX: Abnormal findings on diagnostic imaging of other specified body structures: R93.89

## 2016-09-30 HISTORY — DX: Anemia, unspecified: D64.9

## 2016-09-30 HISTORY — DX: Depression, unspecified: F32.A

## 2016-09-30 SURGERY — ESOPHAGOGASTRODUODENOSCOPY (EGD) WITH PROPOFOL
Anesthesia: Monitor Anesthesia Care

## 2016-09-30 MED ORDER — SODIUM CHLORIDE 0.9 % IV SOLN: INTRAVENOUS | Status: DC

## 2016-09-30 MED ORDER — PROPOFOL 10 MG/ML IV BOLUS
INTRAVENOUS | Status: DC | PRN
  Administered 2016-09-30: 50 mg via INTRAVENOUS

## 2016-09-30 MED ORDER — LACTATED RINGERS IV SOLN
INTRAVENOUS | Status: DC | PRN
  Administered 2016-09-30: 13:00:00 via INTRAVENOUS

## 2016-09-30 MED ORDER — PROPOFOL 500 MG/50ML IV EMUL
INTRAVENOUS | Status: DC | PRN
  Administered 2016-09-30: 125 ug/kg/min via INTRAVENOUS

## 2016-09-30 MED ORDER — LIDOCAINE HCL (CARDIAC) 20 MG/ML IV SOLN
INTRAVENOUS | Status: DC | PRN
  Administered 2016-09-30: 100 mg via INTRAVENOUS

## 2016-09-30 SURGICAL SUPPLY — 15 items

## 2016-09-30 NOTE — Anesthesia Preprocedure Evaluation (Signed)
Anesthesia Evaluation  Patient identified by MRN, date of birth, ID band Patient awake    Reviewed: Allergy & Precautions, NPO status , Patient's Chart, lab work & pertinent test results  Airway Mallampati: II  TM Distance: >3 FB Neck ROM: Full    Dental no notable dental hx.    Pulmonary COPD, former smoker,    Pulmonary exam normal breath sounds clear to auscultation       Cardiovascular hypertension, Normal cardiovascular exam Rhythm:Regular Rate:Normal     Neuro/Psych negative neurological ROS  negative psych ROS   GI/Hepatic Neg liver ROS, GERD  ,  Endo/Other  negative endocrine ROS  Renal/GU negative Renal ROS  negative genitourinary   Musculoskeletal  (+) Arthritis , Rheumatoid disorders,    Abdominal   Peds negative pediatric ROS (+)  Hematology  (+) anemia ,   Anesthesia Other Findings   Reproductive/Obstetrics negative OB ROS                             Anesthesia Physical Anesthesia Plan  ASA: III  Anesthesia Plan: MAC   Post-op Pain Management:    Induction: Intravenous  PONV Risk Score and Plan: 1 and Treatment may vary due to age or medical condition  Airway Management Planned: Simple Face Mask  Additional Equipment:   Intra-op Plan:   Post-operative Plan:   Informed Consent: I have reviewed the patients History and Physical, chart, labs and discussed the procedure including the risks, benefits and alternatives for the proposed anesthesia with the patient or authorized representative who has indicated his/her understanding and acceptance.   Dental advisory given  Plan Discussed with: CRNA and Surgeon  Anesthesia Plan Comments:         Anesthesia Quick Evaluation

## 2016-09-30 NOTE — Transfer of Care (Signed)
Immediate Anesthesia Transfer of Care Note  Patient: Taylor Berry  Procedure(s) Performed: Procedure(s): ESOPHAGOGASTRODUODENOSCOPY (EGD) WITH PROPOFOL (N/A)  Patient Location: PACU  Anesthesia Type:MAC  Level of Consciousness: awake and alert   Airway & Oxygen Therapy: Patient Spontanous Breathing  Post-op Assessment: Report given to RN and Post -op Vital signs reviewed and stable  Post vital signs: Reviewed and stable  Last Vitals:  Vitals:   09/30/16 1233  BP: (!) 155/78  Pulse: 82  Resp: (!) 21  Temp: 36.6 C  SpO2: 98%    Last Pain:  Vitals:   09/30/16 1233  TempSrc: Oral         Complications: No apparent anesthesia complications

## 2016-09-30 NOTE — H&P (Signed)
Subjective:   Patient is a 76 y.o. female presents with Need for EGD. She has a history along cancer and has undergone prior [place] TS resection in 2015 also had a history of uterine cancer. She has a history of bronchiectasis. She's been having problems with sweating with minimal exercise including night sweats in a CT scan was done that showed a secondary in the body of this, it was nuisance 2015. This procedure is done to evaluate that area endoscopically.. Procedure including risks and benefits discussed in office.  Patient Active Problem List   Diagnosis Date Noted  . Hyperhidrosis 06/18/2016  . Diaphoresis 06/05/2015  . Pneumothorax, left 08/31/2013  . Acute bronchitis 05/10/2013  . Anemia in neoplastic disease 05/02/2013  . Left Chest Wound infection 04/29/2013  . Acute blood loss anemia 04/29/2013  . Allergic rhinitis 04/29/2013  . Depression 04/29/2013  . Acute respiratory failure ([place]) 04/22/2013  . Lung cancer, left upper lobe 02/03/2013  . Pulmonary infiltrates 01/05/2013  . Mild pulmonary hypertension ([place]) 02/17/2011  . OSA (obstructive sleep apnea) 02/17/2011  . Dyspnea on exertion 11/22/2010  . COPD (chronic obstructive pulmonary disease) ([place]) 07/20/2009  . Carcinoma in situ of cervix uteri 06/26/2009  . PURE HYPERCHOLESTEROLEMIA 06/26/2009  . OBESITY 06/26/2009  . UNSPECIFIED ANEMIA 06/26/2009  . HYPERTENSION, BENIGN 06/26/2009  . ARTHRITIS, RHEUMATOID 06/26/2009  . OSTEOPOROSIS 06/26/2009  . Cough 06/26/2009   Past Medical History:  Diagnosis Date  . Abnormal CT scan   . Allergic rhinitis   . Anemia 2015   after lung surgery  . Complication of anesthesia    Fentanyl allergy, "claustrophobia"  . COPD (chronic obstructive pulmonary disease) ([place])   . Depression   . Diaphoresis 07/30/2015   Excessive sweating with little exertion  . Difficult intravenous access   . GERD (gastroesophageal reflux disease)    In the past  . Hyperlipidemia   . Hypertension   .  Left bundle branch block   . Lung cancer ([place]) 2015   left upper lobe wedge removed-no further tx.  . Neuropathy    Tingling in arms, fingers, and feet (Since 06/15/16)  . Obesity   . OSA (obstructive sleep apnea)    denies.  . Osteoarthritis    ra also  . Osteoporosis   . Rheumatoid arthritis(714.0)    Dr. Stark Jock follows  . SOB (shortness of breath) on exertion   . Uterine cancer ([place]) 1971   tx surgical    Past Surgical History:  Procedure Laterality Date  . ABDOMINAL HYSTERECTOMY     in situ carcinoma partial  . CARDIAC CATHETERIZATION  01/27/11   minor non-obs CAD, NL EF, mild pulm HTN  . CATARACT EXTRACTION, BILATERAL     last done 12'16-  . COLONOSCOPY W/ BIOPSIES AND POLYPECTOMY    . COLONOSCOPY WITH PROPOFOL N/A 02/13/2015   Procedure: COLONOSCOPY WITH PROPOFOL;  Surgeon: Laurence Spates, MD;  Location: [facility];  Service: Endoscopy;  Laterality: N/A;  . LYMPH NODE DISSECTION Left 04/12/2013   Procedure: LYMPH NODE DISSECTION;  Surgeon: [name], MD;  Location: [facility];  Service: Thoracic;  Laterality: Left;  . TONSILLECTOMY    . VESICOVAGINAL FISTULA CLOSURE W/ TAH  1971  . VIDEO ASSISTED THORACOSCOPY (VATS)/THOROCOTOMY Left 04/12/2013   Procedure: VIDEO ASSISTED THORACOSCOPY (VATS)/THOROCOTOMY, WITH LEFT UPPER LOBE WEDGE RESECTION, CHEST WALL BIOPSY ;  Surgeon: [name], MD;  Location: [facility];  Service: Thoracic;  Laterality: Left;  Marland Kitchen VIDEO BRONCHOSCOPY N/A 04/12/2013   Procedure: VIDEO  BRONCHOSCOPY;  Surgeon: [name], MD;  Location: [facility] OR;  Service: Thoracic;  Laterality: N/A;    Prescriptions Prior to Admission  Medication Sig Dispense Refill Last Dose  . aspirin EC 81 MG tablet Take 81 mg by mouth daily.   09/29/2016 at Unknown time  . atorvastatin (LIPITOR) 20 MG tablet Take 1 tablet (20 mg total) by mouth daily. 90 tablet 3 09/29/2016 at Unknown time  . Calcium-Magnesium-Vitamin D (CALCIUM 1200+D3 PO) Take 1 tablet by mouth daily.    09/29/2016  at Unknown time  . hydroxychloroquine (PLAQUENIL) 200 MG tablet Take 200 mg by mouth 2 (two) times daily.    09/29/2016 at Unknown time  . leflunomide (ARAVA) 20 MG tablet Take 20 mg by mouth daily.   09/29/2016 at Unknown time  . losartan (COZAAR) 100 MG tablet Take 100 mg by mouth daily.    09/29/2016 at Unknown time  . Omega-3 Fatty Acids (FISH OIL) 600 MG CAPS Take 600 mg by mouth daily. Reported on 06/05/2015   09/29/2016 at Unknown time  . Venlafaxine HCl 75 MG TB24 Take 75 mg by mouth daily.    09/29/2016 at Unknown time  . denosumab (PROLIA) 60 MG/ML SOLN injection Inject 60 mg into the skin every 6 (six) months. Administer in upper arm, thigh, or abdomen   More than a month at Unknown time   Allergies  Allergen Reactions  . Latex     REACTION: itching  . Ace Inhibitors     REACTION: cough  . Fentanyl     Hallucination "I go crazy"   . Hydrochlorothiazide     REACTION: decreased sodium  . Penicillins Other (See Comments)    REACTION: intolerant due to yeast infection Has patient had a PCN reaction causing immediate rash, facial/tongue/throat swelling, SOB or lightheadedness with hypotension:No Has patient had a PCN reaction causing severe rash involving mucus membranes or skin necrosis: No Has patient had a PCN reaction that required hospitalization No Has patient had a PCN reaction occurring within the last 10 years: Yes If all of the above answers are "NO", then may proceed with Cephalosporin use.     Social History  Substance Use Topics  . Smoking status: Former Smoker    Packs/day: 1.00    Years: 35.00    Types: Cigarettes    Quit date: 01/07/1988  . Smokeless tobacco: Never Used  . Alcohol use No     Comment: not since 1988    Family History  Problem Relation Age of Onset  . Lung cancer Mother   . Coronary artery disease Son        MI at age 67  . Arthritis/Rheumatoid Maternal Grandmother   . Coronary artery disease Son   . Arthritis/Rheumatoid Son      Objective:    Patient Vitals for the past 8 hrs:  BP Temp Temp src Pulse Resp SpO2 Height Weight  09/30/16 1233 (!) 155/78 97.9 F (36.6 C) Oral 82 (!) 21 98 % 5\' 2"  (1.575 m) 81.6 kg (180 lb)   No intake/output data recorded. No intake/output data recorded.   See MD Preop evaluation      Assessment:   1. Abnormal CT scan of the stomach on the greater curve. Will need EGD and biopsy of this area to rule out tumor 2. History of lung cancer status post VATS surgery 2015 3. History of uterine cancer status post hysterectomy 4. History of sleep apnea  Plan:   We will proceed with  EGD to evaluate the abnormal area in the stomach and to obtain biopsies of appropriate. This is been discussed in detail with the patient in the office.

## 2016-09-30 NOTE — Op Note (Signed)
[facility] Patient Name: Taylor Berry Procedure Date: 09/30/2016 MRN: [id number] Attending MD: Nancy Fetter Dr., MD Date of Birth: Aug 28, 1940 CSN: [id number] Age: 76 Admit Type: Outpatient Procedure:                Upper GI endoscopy Indications:              Abnormal CT of the GI tract, patient has had                            previous VATS resection for lung cancer in the                            past. She has had a lot of problems with sweating                            and vague upper symptoms. She had a CT scan done                            recently which showed a thickened area on the                            greater curve of the stomach it was apparently new                            and endoscopic evaluation was requested. Patient                            has a history rheumatoid arthritis, colon polyps,                            sleep apnea. Providers:                Joyice Faster. Oletta Lamas Dr., MD, Elmer Ramp. Tilden Dome, RN, Tinnie Gens, Technician, Stephanie [place] CRNA Referring MD:              Medicines:                Monitored Anesthesia Care Complications:            No immediate complications. Estimated Blood Loss:     Estimated blood loss: none. Procedure:                Pre-Anesthesia Assessment:                           - Prior to the procedure, a History and Physical                            was performed, and patient medications and                            allergies were reviewed. The patient's tolerance of  previous anesthesia was also reviewed. The risks                            and benefits of the procedure and the sedation                            options and risks were discussed with the patient.                            All questions were answered, and informed consent                            was obtained. Prior Anticoagulants: The patient has        taken no previous anticoagulant or antiplatelet                            agents. ASA Grade Assessment: III - A patient with                            severe systemic disease. After reviewing the risks                            and benefits, the patient was deemed in                            satisfactory condition to undergo the procedure.                           After obtaining informed consent, the endoscope was                            passed under direct vision. Throughout the                            procedure, the patient's blood pressure, pulse, and                            oxygen saturations were monitored continuously. The                            EG-[id number] ([id number]) scope was introduced through the                            mouth, and advanced to the third part of duodenum.                            The upper GI endoscopy was accomplished without                            difficulty. The patient tolerated the procedure                            well. Scope In: Scope Out: Findings:  A small hiatal hernia was present.      There is no endoscopic evidence of Barrett's esophagus, esophagitis,       inflammation, stricture or varices in the entire esophagus.      Normal mucosa was found in the entire examined stomach. The greater       curve of the stomach in particular was examined in both before and       retroflex view very carefully and no masses were seen. No ulcers or any       other gross abnormalities were seen in the stomach. The lumen of the       stomach was somewhat dilated.      A few 3 mm nodules with a localized distribution were found in the       duodenal bulb. Biopsies were taken with a cold forceps for histology.       These had the appearance of nodules due to duodenitis. There were no       ulcers any other lesions seen in the duodenal bulb or in the 2nd       duodenum. Impression:               - Small hiatal hernia.                            - Normal mucosa was found in the entire stomach.                           - Nodule found in the duodenum. Biopsied.                           - Abnormal Imaging normal endoscopic exam. The                            abnormal area seen on CT scan was not demonstrated                            after the stomach was distended with gas. Moderate Sedation:      N/A- Per Anesthesia Care Recommendation:           - Patient has a contact number available for                            emergencies. The signs and symptoms of potential                            delayed complications were discussed with the                            patient. Return to normal activities tomorrow.                            Written discharge instructions were provided to the                            patient.                           -  Resume previous diet.                           - Continue present medications.                           - Return to endoscopist in 1 month.                           - Use Prilosec (omeprazole) 20 mg PO daily. Procedure Code(s):        --- Professional ---                           [phone], Esophagogastroduodenoscopy, flexible,                            transoral; with biopsy, single or multiple Diagnosis Code(s):        --- Professional ---                           K31.89, Other diseases of stomach and duodenum                           R93.3, Abnormal findings on diagnostic imaging of                            other parts of digestive tract CPT copyright 2016 American Medical Association. All rights reserved. The codes documented in this report are preliminary and upon coder review may  be revised to meet current compliance requirements. Nancy Fetter Dr., MD 09/30/2016 1:46:54 PM This report has been signed electronically. Number of Addenda: 0

## 2016-09-30 NOTE — Discharge Instructions (Signed)
Esophagogastroduodenoscopy, Care After Refer to this sheet in the next few weeks. These instructions provide you with information about caring for yourself after your procedure. Your health care provider may also give you more specific instructions. Your treatment has been planned according to current medical practices, but problems sometimes occur. Call your health care provider if you have any problems or questions after your procedure. What can I expect after the procedure? After the procedure, it is common to have:  A sore throat.  Nausea.  Bloating.  Dizziness.  Fatigue.  Follow these instructions at home:  Do not eat or drink anything until the numbing medicine (local anesthetic) has worn off and your gag reflex has returned. You will know that the local anesthetic has worn off when you can swallow comfortably.  Do not drive for 24 hours if you received a medicine to help you relax (sedative).  If your health care provider took a tissue sample for testing during the procedure, make sure to get your test results. This is your responsibility. Ask your health care provider or the department performing the test when your results will be ready.  Keep all follow-up visits as told by your health care provider. This is important. Contact a health care provider if:  You cannot stop coughing.  You are not urinating.  You are urinating less than usual. Get help right away if:  You have trouble swallowing.  You cannot eat or drink.  You have throat or chest pain that gets worse.  You are dizzy or light-headed.  You faint.  You have nausea or vomiting.  You have chills.  You have a fever.  You have severe abdominal pain.  You have black, tarry, or bloody stools. This information is not intended to replace advice given to you by your health care provider. Make sure you discuss any questions you have with your health care provider. Document Released: 12/10/2011 Document  Revised: 05/31/2015 Document Reviewed: 11/16/2014 Elsevier Interactive Patient Education  2018 [place]( Omeprazole) OTC 1 tablet every AM

## 2016-09-30 NOTE — Anesthesia Postprocedure Evaluation (Signed)
Anesthesia Post Note  Patient: Taylor Berry  Procedure(s) Performed: Procedure(s) (LRB): ESOPHAGOGASTRODUODENOSCOPY (EGD) WITH PROPOFOL (N/A)     Patient location during evaluation: PACU Anesthesia Type: MAC Level of consciousness: awake and alert Pain management: pain level controlled Vital Signs Assessment: post-procedure vital signs reviewed and stable Respiratory status: spontaneous breathing, nonlabored ventilation, respiratory function stable and patient connected to nasal cannula oxygen Cardiovascular status: stable and blood pressure returned to baseline Postop Assessment: no apparent nausea or vomiting Anesthetic complications: no    Last Vitals:  Vitals:   09/30/16 1233 09/30/16 1338  BP: (!) 155/78 (!) 152/79  Pulse: 82 78  Resp: (!) 21 (!) 21  Temp: 36.6 C   SpO2: 98% 98%    Last Pain:  Vitals:   09/30/16 1233  TempSrc: Oral                 [name]

## 2016-10-01 ENCOUNTER — Ambulatory Visit (HOSPITAL_COMMUNITY): Payer: PPO | Attending: Internal Medicine

## 2016-10-01 ENCOUNTER — Encounter (HOSPITAL_COMMUNITY): Payer: Self-pay | Admitting: Gastroenterology

## 2016-10-01 ENCOUNTER — Other Ambulatory Visit: Payer: Self-pay

## 2016-10-01 DIAGNOSIS — E785 Hyperlipidemia, unspecified: Secondary | ICD-10-CM | POA: Diagnosis not present

## 2016-10-01 DIAGNOSIS — J449 Chronic obstructive pulmonary disease, unspecified: Secondary | ICD-10-CM | POA: Insufficient documentation

## 2016-10-01 DIAGNOSIS — I447 Left bundle-branch block, unspecified: Secondary | ICD-10-CM | POA: Insufficient documentation

## 2016-10-01 DIAGNOSIS — Z85828 Personal history of other malignant neoplasm of skin: Secondary | ICD-10-CM | POA: Diagnosis not present

## 2016-10-01 DIAGNOSIS — R9439 Abnormal result of other cardiovascular function study: Secondary | ICD-10-CM | POA: Insufficient documentation

## 2016-10-01 DIAGNOSIS — G4733 Obstructive sleep apnea (adult) (pediatric): Secondary | ICD-10-CM | POA: Diagnosis not present

## 2016-10-01 DIAGNOSIS — C44729 Squamous cell carcinoma of skin of left lower limb, including hip: Secondary | ICD-10-CM | POA: Diagnosis not present

## 2016-10-01 DIAGNOSIS — R0600 Dyspnea, unspecified: Secondary | ICD-10-CM | POA: Insufficient documentation

## 2016-10-01 DIAGNOSIS — D485 Neoplasm of uncertain behavior of skin: Secondary | ICD-10-CM | POA: Diagnosis not present

## 2016-10-01 DIAGNOSIS — I10 Essential (primary) hypertension: Secondary | ICD-10-CM | POA: Insufficient documentation

## 2016-10-02 DIAGNOSIS — M810 Age-related osteoporosis without current pathological fracture: Secondary | ICD-10-CM | POA: Diagnosis not present

## 2016-10-02 DIAGNOSIS — Z23 Encounter for immunization: Secondary | ICD-10-CM | POA: Diagnosis not present

## 2016-10-02 NOTE — Progress Notes (Signed)
HPI: FU hyperlipidemia, CM. Pt with H/O dyspnea; also with h/o lung ca, COPD and OSA. Echocardiogram in December of 2012 showed an ejection fraction of 53-64%, grade 1 diastolic dysfunction and mild left atrial enlargement. Cardiac cath 1/13 showed PA 40/16, PCWP 13, EF 55-65, no significant CAD. Chest CT 4/18 showed no recurrence of lung ca, LM and 2 vessel atherosclerosis. TSH 07/29/2016 1.269. I saw patient recently with increased dyspnea. Nuclear study September 2018 showed ejection fraction 32%, breast attenuation but no ischemia. Echocardiogram September 2018 showed ejection fraction 35-40%, mild left ventricular hypertrophy. I reviewed this and felt ejection fraction likely 30%. Since last seen she continues to have dyspnea on exertion which is chronic. She has diaphoresis with activities as well. No orthopnea, PND, pedal edema, chest pain or syncope.  Current Outpatient Prescriptions  Medication Sig Dispense Refill  . aspirin EC 81 MG tablet Take 81 mg by mouth daily.    Marland Kitchen atorvastatin (LIPITOR) 20 MG tablet Take 1 tablet (20 mg total) by mouth daily. 90 tablet 3  . Calcium-Magnesium-Vitamin D (CALCIUM 1200+D3 PO) Take 1 tablet by mouth daily.     Marland Kitchen denosumab (PROLIA) 60 MG/ML SOLN injection Inject 60 mg into the skin every 6 (six) months. Administer in upper arm, thigh, or abdomen    . hydroxychloroquine (PLAQUENIL) 200 MG tablet Take 200 mg by mouth 2 (two) times daily.     Marland Kitchen leflunomide (ARAVA) 20 MG tablet Take 20 mg by mouth daily.    Marland Kitchen losartan (COZAAR) 100 MG tablet Take 100 mg by mouth daily.     . Omega-3 Fatty Acids (FISH OIL) 600 MG CAPS Take 600 mg by mouth daily. Reported on 06/05/2015    . Venlafaxine HCl 75 MG TB24 Take 75 mg by mouth daily.      No current facility-administered medications for this visit.      Past Medical History:  Diagnosis Date  . Abnormal CT scan   . Allergic rhinitis   . Anemia 2015   after lung surgery  . Complication of anesthesia    Fentanyl allergy, "claustrophobia"  . COPD (chronic obstructive pulmonary disease) ([place])   . Depression   . Diaphoresis 07/30/2015   Excessive sweating with little exertion  . Difficult intravenous access   . GERD (gastroesophageal reflux disease)    In the past  . Hyperlipidemia   . Hypertension   . Left bundle branch block   . Lung cancer ([place]) 2015   left upper lobe wedge removed-no further tx.  . Neuropathy    Tingling in arms, fingers, and feet (Since 06/15/16)  . Obesity   . OSA (obstructive sleep apnea)    denies.  . Osteoarthritis    ra also  . Osteoporosis   . Rheumatoid arthritis(714.0)    Dr. Stark Jock follows  . SOB (shortness of breath) on exertion   . Uterine cancer ([place]) 1971   tx surgical    Past Surgical History:  Procedure Laterality Date  . ABDOMINAL HYSTERECTOMY     in situ carcinoma partial  . CARDIAC CATHETERIZATION  01/27/11   minor non-obs CAD, NL EF, mild pulm HTN  . CATARACT EXTRACTION, BILATERAL     last done 12'16-  . COLONOSCOPY W/ BIOPSIES AND POLYPECTOMY    . COLONOSCOPY WITH PROPOFOL N/A 02/13/2015   Procedure: COLONOSCOPY WITH PROPOFOL;  Surgeon: Laurence Spates, MD;  Location: [facility];  Service: Endoscopy;  Laterality: N/A;  . ESOPHAGOGASTRODUODENOSCOPY (EGD) WITH PROPOFOL N/A 09/30/2016  Procedure: ESOPHAGOGASTRODUODENOSCOPY (EGD) WITH PROPOFOL;  Surgeon: Laurence Spates, MD;  [facility];  Service: Endoscopy;  Laterality: N/A;  . LYMPH NODE DISSECTION Left 04/12/2013   Procedure: LYMPH NODE DISSECTION;  Surgeon: [name], MD;  Location: [facility];  Service: Thoracic;  Laterality: Left;  . TONSILLECTOMY    . VESICOVAGINAL FISTULA CLOSURE W/ TAH  1971  . VIDEO ASSISTED THORACOSCOPY (VATS)/THOROCOTOMY Left 04/12/2013   Procedure: VIDEO ASSISTED THORACOSCOPY (VATS)/THOROCOTOMY, WITH LEFT UPPER LOBE WEDGE RESECTION, CHEST WALL BIOPSY ;  Surgeon: [name], MD;  Location: [facility];  Service: Thoracic;  Laterality: Left;  Marland Kitchen  VIDEO BRONCHOSCOPY N/A 04/12/2013   Procedure: VIDEO BRONCHOSCOPY;  Surgeon: [name], MD;  Location: [facility] OR;  Service: Thoracic;  Laterality: N/A;    Social History   Social History  . Marital status: Widowed    Spouse name: N/A  . Number of children: 2  . 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 Smoking status: Former Smoker    Packs/day: 1.00    Years: 35.00    Types: Cigarettes    Quit date: 01/07/1988  . Smokeless tobacco: Never Used  . Alcohol use No     Comment: not since 1988  . Drug use: No  . Sexual activity: No   Other Topics Concern  . Not on file   Social History Narrative  . No narrative on file    Family History  Problem Relation Age of Onset  . Lung cancer Mother   . Coronary artery disease Son        MI at age 16  . Arthritis/Rheumatoid Maternal Grandmother   . Coronary artery disease Son   . Arthritis/Rheumatoid Son     ROS: no fevers or chills, productive cough, hemoptysis, dysphasia, odynophagia, melena, hematochezia, dysuria, hematuria, rash, seizure activity, orthopnea, PND, pedal edema, claudication. Remaining systems are negative.  Physical Exam: Well-developed well-nourished in no acute distress.  Skin is warm and dry.  HEENT is normal.  Neck is supple.  Chest Diminished breath sounds throughout Cardiovascular exam is regular rate and rhythm.  Abdominal exam nontender or distended. No masses palpated. Extremities show no edema. neuro grossly intact   A/P  1 Cardiomyopathy-patient's LV function is newly reduced. Continue ARB. Add carvedilol 3.125 mg twice a day. Increase as tolerated. Etiology of cardiomyopathy is unclear. She does not have a history of alcohol abuse. TSH July 2018 normal. Previous CT scan did show coronary calcification. I think she needs definitive evaluation to exclude coronary disease. Plan R and L cardiac catheterization. The risks and benefits  including myocardial infarction, CVA and death discussed and she agrees to proceed.  2 coronary artery disease-patient was noted to have coronary calcification on recent CT scan. Continue aspirin and statin.  3 hypertension-blood pressure is controlled. Continue present medications. Add coreg as outlined for CM.  4 hyperlipidemia-statin recently initiated. Await follow-up lipids and liver.  5 dyspnea-this was previously felt to be multifactorial. This includes history of lung cancer status post lung resection, COPD, obstructive sleep apnea and obesity hypoventilation syndrome. Cardiomyopathy could be contributing. We will advance medications as needed.  Kirk Ruths, MD

## 2016-10-03 ENCOUNTER — Encounter: Payer: Self-pay | Admitting: Cardiology

## 2016-10-03 ENCOUNTER — Ambulatory Visit (INDEPENDENT_AMBULATORY_CARE_PROVIDER_SITE_OTHER): Payer: PPO | Admitting: Cardiology

## 2016-10-03 VITALS — BP 124/86 | HR 82 | Ht 62.0 in | Wt 182.4 lb

## 2016-10-03 DIAGNOSIS — Z01812 Encounter for preprocedural laboratory examination: Secondary | ICD-10-CM

## 2016-10-03 DIAGNOSIS — I2510 Atherosclerotic heart disease of native coronary artery without angina pectoris: Secondary | ICD-10-CM

## 2016-10-03 DIAGNOSIS — D689 Coagulation defect, unspecified: Secondary | ICD-10-CM

## 2016-10-03 DIAGNOSIS — R5383 Other fatigue: Secondary | ICD-10-CM

## 2016-10-03 DIAGNOSIS — I420 Dilated cardiomyopathy: Secondary | ICD-10-CM | POA: Diagnosis not present

## 2016-10-03 MED ORDER — CARVEDILOL 3.125 MG PO TABS: 3.1250 mg | ORAL_TABLET | Freq: Two times a day (BID) | ORAL | 3 refills | Status: DC

## 2016-10-03 NOTE — Patient Instructions (Signed)
Medication Instructions:  START- Carvedilol 3.125 mg twice a day  If you need a refill on your cardiac medications before your next appointment, please call your pharmacy.  Labwork: Pre Op Labs today HERE IN OUR OFFICE AT [facility]  Testing/Procedures:   [facility] [address] [place] [address] Dept: [phone] Loc: [phone]  KACEE SUKHU  10/03/2016  You are scheduled for a Cardiac Catheterization on Wednesday, October 3 with Dr. Lauree Chandler.  1. Please arrive at the [facility] (Main Entrance A) at [facility]: [place], [place] [address] at 9:00 AM (two hours before your procedure to ensure your preparation). Free valet parking service is available.   Special note: Every effort is made to have your procedure done on time. Please understand that emergencies sometimes delay scheduled procedures.  2. Diet: Do not eat or drink anything after midnight prior to your procedure except sips of water to take medications.  3. Labs: You will need to have blood drawn on Friday, September 28 at [place], [place]  Open: [place] (Lunch 12:30 - 1:30)   Phone: [phone]. You do not need to be fasting.  4. Medication instructions in preparation for your procedure:   Current Outpatient Prescriptions (Endocrine & Metabolic):  .  denosumab (PROLIA) 60 MG/ML SOLN injection, Inject 60 mg into the skin every 6 (six) months. Administer in upper arm, thigh, or abdomen  Current Outpatient Prescriptions (Cardiovascular):  .  atorvastatin (LIPITOR) 20 MG tablet, Take 1 tablet (20 mg total) by mouth daily. Marland Kitchen  losartan (COZAAR) 100 MG tablet, Take 100 mg by mouth daily.    Current Outpatient Prescriptions (Analgesics):  .  aspirin EC 81 MG tablet, Take 81 mg by mouth daily. Marland Kitchen  leflunomide (ARAVA) 20 MG tablet, Take 20 mg by mouth  daily.   Current Outpatient Prescriptions (Other):  Marland Kitchen  Calcium-Magnesium-Vitamin D (CALCIUM 1200+D3 PO), Take 1 tablet by mouth daily.  .  hydroxychloroquine (PLAQUENIL) 200 MG tablet, Take 200 mg by mouth 2 (two) times daily.  .  Omega-3 Fatty Acids (FISH OIL) 600 MG CAPS, Take 600 mg by mouth daily. Reported on 06/05/2015 .  Venlafaxine HCl 75 MG TB24, Take 75 mg by mouth daily.  *For reference purposes while preparing patient instructions.   Delete this med list prior to printing instructions for patient.*  None    On the morning of your procedure, take your Aspirin and any morning medicines NOT listed above.  You may use sips of water.  5. Plan for one night stay--bring personal belongings. 6. Bring a current list of your medications and current insurance cards. 7. You MUST have a responsible person to drive you home. 8. Someone MUST be with you the first 24 hours after you arrive home or your discharge will be delayed. 9. Please wear clothes that are easy to get on and off and wear slip-on shoes.  Thank you for allowing [place] to care for you!   -- [facility]   Follow-Up: Your physician wants you to follow-up in: 8 weeks with Dr Stanford Breed.    Thank you for choosing [facility] at [facility]!!

## 2016-10-04 LAB — CBC
Hematocrit: 35.5 % (ref 34.0–46.6)
Hemoglobin: 12 g/dL (ref 11.1–15.9)
MCH: 30.5 pg (ref 26.6–33.0)
MCHC: 33.8 g/dL (ref 31.5–35.7)
MCV: 90 fL (ref 79–97)
Platelets: 159 10*3/uL (ref 150–379)
RBC: 3.93 x10E6/uL (ref 3.77–5.28)
RDW: 13.8 % (ref 12.3–15.4)
WBC: 4.2 10*3/uL (ref 3.4–10.8)

## 2016-10-04 LAB — COMPREHENSIVE METABOLIC PANEL
ALT: 6 IU/L (ref 0–32)
AST: 25 IU/L (ref 0–40)
Albumin/Globulin Ratio: 1.5 (ref 1.2–2.2)
Albumin: 4 g/dL (ref 3.5–4.8)
Alkaline Phosphatase: 40 IU/L (ref 39–117)
BUN/Creatinine Ratio: 21 (ref 12–28)
BUN: 14 mg/dL (ref 8–27)
Bilirubin Total: 0.5 mg/dL (ref 0.0–1.2)
CO2: 23 mmol/L (ref 20–29)
Calcium: 9 mg/dL (ref 8.7–10.3)
Chloride: 103 mmol/L (ref 96–106)
Creatinine, Ser: 0.66 mg/dL (ref 0.57–1.00)
GFR calc Af Amer: 99 mL/min/{1.73_m2} (ref >59)
GFR calc non Af Amer: 86 mL/min/{1.73_m2} (ref >59)
Globulin, Total: 2.7 g/dL (ref 1.5–4.5)
Glucose: 92 mg/dL (ref 65–99)
Potassium: 4.5 mmol/L (ref 3.5–5.2)
Sodium: 140 mmol/L (ref 134–144)
Total Protein: 6.7 g/dL (ref 6.0–8.5)

## 2016-10-04 LAB — PROTIME-INR
INR: 1 (ref 0.8–1.2)
Prothrombin Time: 10.3 s (ref 9.1–12.0)

## 2016-10-04 LAB — TSH: TSH: 0.956 u[IU]/mL (ref 0.450–4.500)

## 2016-10-07 ENCOUNTER — Telehealth: Payer: Self-pay

## 2016-10-07 NOTE — Telephone Encounter (Signed)
Patient contacted pre-catheterization at [facility] scheduled for:  10/08/2016 @ 1130 Verified arrival time and place:  [facility] @ 0900 Confirmed AM meds to be taken pre-cath with sip of water: Take ASA Confirmed patient has responsible person to drive home post procedure and observe patient for 24 hours:   yes Addl concerns:  none

## 2016-10-08 ENCOUNTER — Ambulatory Visit (HOSPITAL_COMMUNITY)
Admission: RE | Disposition: A | Discharge status: Home / Self Care | Payer: Self-pay | Source: Ambulatory Visit | Attending: Cardiovascular Disease

## 2016-10-08 ENCOUNTER — Ambulatory Visit (HOSPITAL_COMMUNITY)
Admission: RE | Admit: 2016-10-08 | Discharge: 2016-10-08 | Disposition: A | Discharge status: Home / Self Care | Payer: PPO | Source: Ambulatory Visit | Attending: Cardiovascular Disease | Admitting: Cardiovascular Disease

## 2016-10-08 DIAGNOSIS — G4733 Obstructive sleep apnea (adult) (pediatric): Secondary | ICD-10-CM | POA: Insufficient documentation

## 2016-10-08 DIAGNOSIS — Z7982 Long term (current) use of aspirin: Secondary | ICD-10-CM | POA: Diagnosis not present

## 2016-10-08 DIAGNOSIS — M810 Age-related osteoporosis without current pathological fracture: Secondary | ICD-10-CM | POA: Diagnosis not present

## 2016-10-08 DIAGNOSIS — Z902 Acquired absence of lung [part of]: Secondary | ICD-10-CM | POA: Diagnosis not present

## 2016-10-08 DIAGNOSIS — E669 Obesity, unspecified: Secondary | ICD-10-CM | POA: Insufficient documentation

## 2016-10-08 DIAGNOSIS — G629 Polyneuropathy, unspecified: Secondary | ICD-10-CM | POA: Insufficient documentation

## 2016-10-08 DIAGNOSIS — J449 Chronic obstructive pulmonary disease, unspecified: Secondary | ICD-10-CM | POA: Insufficient documentation

## 2016-10-08 DIAGNOSIS — Z85118 Personal history of other malignant neoplasm of bronchus and lung: Secondary | ICD-10-CM | POA: Diagnosis not present

## 2016-10-08 DIAGNOSIS — I447 Left bundle-branch block, unspecified: Secondary | ICD-10-CM | POA: Diagnosis not present

## 2016-10-08 DIAGNOSIS — I428 Other cardiomyopathies: Secondary | ICD-10-CM | POA: Insufficient documentation

## 2016-10-08 DIAGNOSIS — I10 Essential (primary) hypertension: Secondary | ICD-10-CM | POA: Diagnosis not present

## 2016-10-08 DIAGNOSIS — E785 Hyperlipidemia, unspecified: Secondary | ICD-10-CM | POA: Diagnosis not present

## 2016-10-08 DIAGNOSIS — K219 Gastro-esophageal reflux disease without esophagitis: Secondary | ICD-10-CM | POA: Diagnosis not present

## 2016-10-08 DIAGNOSIS — Z6832 Body mass index (BMI) 32.0-32.9, adult: Secondary | ICD-10-CM | POA: Diagnosis not present

## 2016-10-08 DIAGNOSIS — M069 Rheumatoid arthritis, unspecified: Secondary | ICD-10-CM | POA: Diagnosis not present

## 2016-10-08 DIAGNOSIS — Z87891 Personal history of nicotine dependence: Secondary | ICD-10-CM | POA: Diagnosis not present

## 2016-10-08 DIAGNOSIS — F329 Major depressive disorder, single episode, unspecified: Secondary | ICD-10-CM | POA: Insufficient documentation

## 2016-10-08 HISTORY — PX: RIGHT/LEFT HEART CATH AND CORONARY ANGIOGRAPHY: CATH118266

## 2016-10-08 LAB — POCT I-STAT 3, ART BLOOD GAS (G3+)
Acid-base deficit: 1 mmol/L (ref 0.0–2.0)
Bicarbonate: 23.7 mmol/L (ref 20.0–28.0)
O2 Saturation: 95 %
TCO2: 25 mmol/L (ref 22–32)
pCO2 arterial: 39.7 mmHg (ref 32.0–48.0)
pH, Arterial: 7.385 (ref 7.350–7.450)
pO2, Arterial: 76 mmHg — ABNORMAL LOW (ref 83.0–108.0)

## 2016-10-08 LAB — POCT I-STAT 3, VENOUS BLOOD GAS (G3P V)
Acid-base deficit: 1 mmol/L (ref 0.0–2.0)
Bicarbonate: 25 mmol/L (ref 20.0–28.0)
O2 Saturation: 70 %
TCO2: 26 mmol/L (ref 22–32)
pCO2, Ven: 43.8 mmHg — ABNORMAL LOW (ref 44.0–60.0)
pH, Ven: 7.364 (ref 7.250–7.430)
pO2, Ven: 38 mmHg (ref 32.0–45.0)

## 2016-10-08 LAB — GLUCOSE, CAPILLARY: Glucose-Capillary: 80 mg/dL (ref 65–99)

## 2016-10-08 SURGERY — RIGHT/LEFT HEART CATH AND CORONARY ANGIOGRAPHY
Anesthesia: LOCAL

## 2016-10-08 MED ORDER — IOPAMIDOL (ISOVUE-370) INJECTION 76%
INTRAVENOUS | Status: DC | PRN
  Administered 2016-10-08: 45 mL via INTRA_ARTERIAL

## 2016-10-08 MED ORDER — MIDAZOLAM HCL 2 MG/2ML IJ SOLN
INTRAMUSCULAR | Status: DC | PRN
  Administered 2016-10-08 (×2): 1 mg via INTRAVENOUS

## 2016-10-08 MED ORDER — SODIUM CHLORIDE 0.9% FLUSH: 3.0000 mL | Freq: Two times a day (BID) | INTRAVENOUS | Status: DC

## 2016-10-08 MED ORDER — ASPIRIN 81 MG PO CHEW: 81.0000 mg | CHEWABLE_TABLET | ORAL | Status: DC

## 2016-10-08 MED ORDER — SODIUM CHLORIDE 0.9 % WEIGHT BASED INFUSION: 1.0000 mL/kg/h | INTRAVENOUS | Status: DC

## 2016-10-08 MED ORDER — HEPARIN SODIUM (PORCINE) 1000 UNIT/ML IJ SOLN
INTRAMUSCULAR | Status: AC
  Filled 2016-10-08: qty 1

## 2016-10-08 MED ORDER — HEPARIN (PORCINE) IN NACL 2-0.9 UNIT/ML-% IJ SOLN
INTRAMUSCULAR | Status: AC
  Filled 2016-10-08: qty 1000

## 2016-10-08 MED ORDER — SODIUM CHLORIDE 0.9% FLUSH: 3.0000 mL | INTRAVENOUS | Status: DC | PRN

## 2016-10-08 MED ORDER — MIDAZOLAM HCL 2 MG/2ML IJ SOLN
INTRAMUSCULAR | Status: AC
  Filled 2016-10-08: qty 2

## 2016-10-08 MED ORDER — HEPARIN (PORCINE) IN NACL 2-0.9 UNIT/ML-% IJ SOLN
INTRAMUSCULAR | Status: AC | PRN
  Administered 2016-10-08: 1000 mL

## 2016-10-08 MED ORDER — SODIUM CHLORIDE 0.9 % IV SOLN: INTRAVENOUS | Status: AC

## 2016-10-08 MED ORDER — VERAPAMIL HCL 2.5 MG/ML IV SOLN
INTRAVENOUS | Status: AC
  Filled 2016-10-08: qty 2

## 2016-10-08 MED ORDER — HEPARIN SODIUM (PORCINE) 1000 UNIT/ML IJ SOLN
INTRAMUSCULAR | Status: DC | PRN
  Administered 2016-10-08: 4000 [IU] via INTRAVENOUS

## 2016-10-08 MED ORDER — SODIUM CHLORIDE 0.9 % IV SOLN: 250.0000 mL | INTRAVENOUS | Status: DC | PRN

## 2016-10-08 MED ORDER — VERAPAMIL HCL 2.5 MG/ML IV SOLN
INTRAVENOUS | Status: DC | PRN
  Administered 2016-10-08: 10 mL via INTRA_ARTERIAL

## 2016-10-08 MED ORDER — IOPAMIDOL (ISOVUE-370) INJECTION 76%
INTRAVENOUS | Status: AC
  Filled 2016-10-08: qty 100

## 2016-10-08 MED ORDER — LIDOCAINE HCL (PF) 1 % IJ SOLN
INTRAMUSCULAR | Status: DC | PRN
  Administered 2016-10-08: 5 mL via SUBCUTANEOUS

## 2016-10-08 MED ORDER — NITROGLYCERIN 1 MG/10 ML FOR IR/CATH LAB
INTRA_ARTERIAL | Status: AC
  Filled 2016-10-08: qty 10

## 2016-10-08 MED ORDER — SODIUM CHLORIDE 0.9 % WEIGHT BASED INFUSION
3.0000 mL/kg/h | INTRAVENOUS | Status: AC
  Administered 2016-10-08: 3 mL/kg/h via INTRAVENOUS

## 2016-10-08 SURGICAL SUPPLY — 9 items
CATH 5FR JL3.5 JR4 ANG PIG MP (CATHETERS) ×1 IMPLANT
CATH BALLN WEDGE 5F 110CM (CATHETERS) ×1 IMPLANT
DEVICE RAD COMP TR BAND LRG (VASCULAR PRODUCTS) ×1 IMPLANT
GLIDESHEATH SLEND SS 6F .021 (SHEATH) ×1 IMPLANT
KIT HEART LEFT (KITS) ×2 IMPLANT
PACK CARDIAC CATHETERIZATION (CUSTOM PROCEDURE TRAY) ×2 IMPLANT
SHEATH GLIDE SLENDER 4/5FR (SHEATH) ×1 IMPLANT
TRANSDUCER W/STOPCOCK (MISCELLANEOUS) ×2 IMPLANT
TUBING CIL FLEX 10 FLL-RA (TUBING) ×2 IMPLANT

## 2016-10-08 NOTE — Interval H&P Note (Signed)
History and Physical Interval Note:  10/08/2016 11:53 AM  Taylor Berry  has presented today for cardiac cath with the diagnosis of cardiomyopathy  The various methods of treatment have been discussed with the patient and family. After consideration of risks, benefits and other options for treatment, the patient has consented to  Procedure(s): RIGHT/LEFT HEART CATH AND CORONARY ANGIOGRAPHY (N/A) as a surgical intervention .  The patient's history has been reviewed, patient examined, no change in status, stable for surgery.  I have reviewed the patient's chart and labs.  Questions were answered to the patient's satisfaction.    Cath Lab Visit (complete for each Cath Lab visit)  Clinical Evaluation Leading to the Procedure:   ACS: No.  Non-ACS:    Anginal Classification: CCS I  Anti-ischemic medical therapy: Minimal Therapy (1 class of medications)  Non-Invasive Test Results: No non-invasive testing performed  Prior CABG: No previous CABG         Lauree Chandler

## 2016-10-08 NOTE — Discharge Instructions (Signed)

## 2016-10-08 NOTE — H&P (View-Only) (Signed)
    HPI: FU hyperlipidemia, CM. Pt with H/O dyspnea; also with h/o lung ca, COPD and OSA. Echocardiogram in December of 2012 showed an ejection fraction of 50-55%, grade 1 diastolic dysfunction and mild left atrial enlargement. Cardiac cath 1/13 showed PA 40/16, PCWP 13, EF 55-65, no significant CAD. Chest CT 4/18 showed no recurrence of lung ca, LM and 2 vessel atherosclerosis. TSH 07/29/2016 1.269. I saw patient recently with increased dyspnea. Nuclear study September 2018 showed ejection fraction 32%, breast attenuation but no ischemia. Echocardiogram September 2018 showed ejection fraction 35-40%, mild left ventricular hypertrophy. I reviewed this and felt ejection fraction likely 30%. Since last seen she continues to have dyspnea on exertion which is chronic. She has diaphoresis with activities as well. No orthopnea, PND, pedal edema, chest pain or syncope.  Current Outpatient Prescriptions  Medication Sig Dispense Refill  . aspirin EC 81 MG tablet Take 81 mg by mouth daily.    . atorvastatin (LIPITOR) 20 MG tablet Take 1 tablet (20 mg total) by mouth daily. 90 tablet 3  . Calcium-Magnesium-Vitamin D (CALCIUM 1200+D3 PO) Take 1 tablet by mouth daily.     . denosumab (PROLIA) 60 MG/ML SOLN injection Inject 60 mg into the skin every 6 (six) months. Administer in upper arm, thigh, or abdomen    . hydroxychloroquine (PLAQUENIL) 200 MG tablet Take 200 mg by mouth 2 (two) times daily.     . leflunomide (ARAVA) 20 MG tablet Take 20 mg by mouth daily.    . losartan (COZAAR) 100 MG tablet Take 100 mg by mouth daily.     . Omega-3 Fatty Acids (FISH OIL) 600 MG CAPS Take 600 mg by mouth daily. Reported on 06/05/2015    . Venlafaxine HCl 75 MG TB24 Take 75 mg by mouth daily.      No current facility-administered medications for this visit.      Past Medical History:  Diagnosis Date  . Abnormal CT scan   . Allergic rhinitis   . Anemia 2015   after lung surgery  . Complication of anesthesia    Fentanyl allergy, "claustrophobia"  . COPD (chronic obstructive pulmonary disease) ([facility])   . Depression   . Diaphoresis 07/30/2015   Excessive sweating with little exertion  . Difficult intravenous access   . GERD (gastroesophageal reflux disease)    In the past  . Hyperlipidemia   . Hypertension   . Left bundle branch block   . Lung cancer ([facility]) 2015   left upper lobe wedge removed-no further tx.  . Neuropathy    Tingling in arms, fingers, and feet (Since 06/15/16)  . Obesity   . OSA (obstructive sleep apnea)    denies.  . Osteoarthritis    ra also  . Osteoporosis   . Rheumatoid arthritis(714.0)    Dr. Beatmon follows  . SOB (shortness of breath) on exertion   . Uterine cancer ([facility]) 1971   tx surgical    Past Surgical History:  Procedure Laterality Date  . ABDOMINAL HYSTERECTOMY     in situ carcinoma partial  . CARDIAC CATHETERIZATION  01/27/11   minor non-obs CAD, NL EF, mild pulm HTN  . CATARACT EXTRACTION, BILATERAL     last done 12'16-  . COLONOSCOPY W/ BIOPSIES AND POLYPECTOMY    . COLONOSCOPY WITH PROPOFOL N/A 02/13/2015   Procedure: COLONOSCOPY WITH PROPOFOL;  Surgeon: James Edwards, MD;  [facility];  Service: Endoscopy;  Laterality: N/A;  . ESOPHAGOGASTRODUODENOSCOPY (EGD) WITH PROPOFOL N/A 09/30/2016     Procedure: ESOPHAGOGASTRODUODENOSCOPY (EGD) WITH PROPOFOL;  Surgeon: Edwards, James, MD;  [facility];  Service: Endoscopy;  Laterality: N/A;  . LYMPH NODE DISSECTION Left 04/12/2013   Procedure: LYMPH NODE DISSECTION;  Surgeon: Edward B Gerhardt, MD;  [facility];  Service: Thoracic;  Laterality: Left;  . TONSILLECTOMY    . VESICOVAGINAL FISTULA CLOSURE W/ TAH  1971  . VIDEO ASSISTED THORACOSCOPY (VATS)/THOROCOTOMY Left 04/12/2013   Procedure: VIDEO ASSISTED THORACOSCOPY (VATS)/THOROCOTOMY, WITH LEFT UPPER LOBE WEDGE RESECTION, CHEST WALL BIOPSY ;  Surgeon: Edward B Gerhardt, MD;  Location[facility];  Service: Thoracic;  Laterality: Left;  .  VIDEO BRONCHOSCOPY N/A 04/12/2013   Procedure: VIDEO BRONCHOSCOPY;  Surgeon: Edward B Gerhardt, MD;  Location: [facility];  Service: Thoracic;  Laterality: N/A;    Social History   Social History  . Marital status: Widowed    Spouse name: N/A  . Number of children: 2  . 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 Smoking status: Former Smoker    Packs/day: 1.00    Years: 35.00    Types: Cigarettes    Quit date: 01/07/1988  . Smokeless tobacco: Never Used  . Alcohol use No     Comment: not since 1988  . Drug use: No  . Sexual activity: No   Other Topics Concern  . Not on file   Social History Narrative  . No narrative on file    Family History  Problem Relation Age of Onset  . Lung cancer Mother   . Coronary artery disease Son        MI at age 45  . Arthritis/Rheumatoid Maternal Grandmother   . Coronary artery disease Son   . Arthritis/Rheumatoid Son     ROS: no fevers or chills, productive cough, hemoptysis, dysphasia, odynophagia, melena, hematochezia, dysuria, hematuria, rash, seizure activity, orthopnea, PND, pedal edema, claudication. Remaining systems are negative.  Physical Exam: Well-developed well-nourished in no acute distress.  Skin is warm and dry.  HEENT is normal.  Neck is supple.  Chest Diminished breath sounds throughout Cardiovascular exam is regular rate and rhythm.  Abdominal exam nontender or distended. No masses palpated. Extremities show no edema. neuro grossly intact   A/P  1 Cardiomyopathy-patient's LV function is newly reduced. Continue ARB. Add carvedilol 3.125 mg twice a day. Increase as tolerated. Etiology of cardiomyopathy is unclear. She does not have a history of alcohol abuse. TSH July 2018 normal. Previous CT scan did show coronary calcification. I think she needs definitive evaluation to exclude coronary disease. Plan R and L cardiac catheterization. The risks and benefits  including myocardial infarction, CVA and death discussed and she agrees to proceed.  2 coronary artery disease-patient was noted to have coronary calcification on recent CT scan. Continue aspirin and statin.  3 hypertension-blood pressure is controlled. Continue present medications. Add coreg as outlined for CM.  4 hyperlipidemia-statin recently initiated. Await follow-up lipids and liver.  5 dyspnea-this was previously felt to be multifactorial. This includes history of lung cancer status post lung resection, COPD, obstructive sleep apnea and obesity hypoventilation syndrome. Cardiomyopathy could be contributing. We will advance medications as needed.  Cecelia Graciano, MD    

## 2016-10-09 ENCOUNTER — Encounter (HOSPITAL_COMMUNITY): Payer: Self-pay | Admitting: Cardiovascular Disease

## 2016-10-10 ENCOUNTER — Telehealth: Payer: Self-pay | Admitting: Cardiovascular Disease

## 2016-10-10 MED FILL — Nitroglycerin IV Soln 100 MCG/ML in D5W: INTRA_ARTERIAL | Qty: 10 | Status: AC

## 2016-10-10 NOTE — Telephone Encounter (Signed)
Patient is very perturbed she has not been called with further instructions after catheterization. She wants instructions on when to resume Silver Sneakers, what medications she needs to take, and when her next appointment will be. Reviewed cath results with patient. She got increasingly frustrated because no changes are being made at this time and stated "You don't even know me or my medicines."  Reiterated to patient that I am a nurse with Dr. Stanford Breed and can see her records.  Instructed patient to continue current medications and Dr. Jacalyn Lefevre nurse will call to address her concerns and to arrange follow-up.

## 2016-10-10 NOTE — Telephone Encounter (Signed)
Spoke with pt, aware there is nothing to do at this point but continue on her present medications as prescribed. She was given the okay to return to normal activities. We will contact her regarding a follow up appointment.

## 2016-10-10 NOTE — Telephone Encounter (Signed)
New Message  Pt would like further instructions post Cath procedure. Please call back to discuss

## 2016-10-13 ENCOUNTER — Encounter: Payer: Self-pay | Admitting: Cardiology

## 2016-10-21 DIAGNOSIS — Z803 Family history of malignant neoplasm of breast: Secondary | ICD-10-CM | POA: Diagnosis not present

## 2016-10-21 DIAGNOSIS — Z1231 Encounter for screening mammogram for malignant neoplasm of breast: Secondary | ICD-10-CM | POA: Diagnosis not present

## 2016-11-19 ENCOUNTER — Encounter: Payer: Self-pay | Admitting: *Deleted

## 2016-11-20 NOTE — Progress Notes (Signed)
HPI: FU hyperlipidemia, CM. Pt with H/O dyspnea; also with h/o lung ca, COPD and OSA. Echocardiogram in December of 2012 showed an ejection fraction of 09-23%, grade 1 diastolic dysfunction and mild left atrial enlargement. Chest CT 4/18 showed no recurrence of lung ca, LM and 2 vessel atherosclerosis. TSH 07/29/2016 1.269. Echocardiogram September 2018 showed ejection fraction 35-40%, mild left ventricular hypertrophy. I reviewed this and felt ejection fraction likely 30%. Cardiac catheterization October 2018 showed mild nonobstructive coronary disease and normal filling pressures. Since last seen she continues to have dyspnea on exertion but no orthopnea, PND, pedal edema, chest pain or syncope.  Current Outpatient Medications  Medication Sig Dispense Refill  . aspirin EC 81 MG tablet Take 81 mg by mouth daily.    Marland Kitchen atorvastatin (LIPITOR) 20 MG tablet Take 1 tablet (20 mg total) by mouth daily. 90 tablet 3  . Calcium-Magnesium-Vitamin D (CALCIUM 1200+D3 PO) Take 1 tablet by mouth daily.     . carvedilol (COREG) 3.125 MG tablet Take 1 tablet (3.125 mg total) by mouth 2 (two) times daily. 180 tablet 3  . denosumab (PROLIA) 60 MG/ML SOLN injection Inject 60 mg into the skin every 6 (six) months. Administer in upper arm, thigh, or abdomen    . hydroxychloroquine (PLAQUENIL) 200 MG tablet Take 200 mg by mouth 2 (two) times daily.     Marland Kitchen leflunomide (ARAVA) 20 MG tablet Take 20 mg by mouth daily.    Marland Kitchen losartan (COZAAR) 100 MG tablet Take 100 mg by mouth daily.     . Omega-3 Fatty Acids (FISH OIL) 600 MG CAPS Take 600 mg by mouth daily. Reported on 06/05/2015    . Venlafaxine HCl 75 MG TB24 Take 75 mg by mouth daily.      No current facility-administered medications for this visit.      Past Medical History:  Diagnosis Date  . Abnormal CT scan   . Allergic rhinitis   . Anemia 2015   after lung surgery  . Complication of anesthesia    Fentanyl allergy, "claustrophobia"  . COPD (chronic  obstructive pulmonary disease) ([place])   . Depression   . Diaphoresis 07/30/2015   Excessive sweating with little exertion  . Difficult intravenous access   . GERD (gastroesophageal reflux disease)    In the past  . Hyperlipidemia   . Hypertension   . Left bundle branch block   . Lung cancer ([place]) 2015   left upper lobe wedge removed-no further tx.  . Neuropathy    Tingling in arms, fingers, and feet (Since 06/15/16)  . Obesity   . OSA (obstructive sleep apnea)    denies.  . Osteoarthritis    ra also  . Osteoporosis   . Rheumatoid arthritis(714.0)    Dr. Stark Jock follows  . SOB (shortness of breath) on exertion   . Uterine cancer ([place]) 1971   tx surgical    Past Surgical History:  Procedure Laterality Date  . ABDOMINAL HYSTERECTOMY     in situ carcinoma partial  . CARDIAC CATHETERIZATION  01/27/11   minor non-obs CAD, NL EF, mild pulm HTN  . CATARACT EXTRACTION, BILATERAL     last done 12'16-  . COLONOSCOPY W/ BIOPSIES AND POLYPECTOMY    . COLONOSCOPY WITH PROPOFOL N/A 02/13/2015   Procedure: COLONOSCOPY WITH PROPOFOL;  Surgeon: Laurence Spates, MD;  Location: [facility];  Service: Endoscopy;  Laterality: N/A;  . ESOPHAGOGASTRODUODENOSCOPY (EGD) WITH PROPOFOL N/A 09/30/2016   Procedure: ESOPHAGOGASTRODUODENOSCOPY (EGD) WITH PROPOFOL;  Surgeon: Laurence Spates, MD;  Location: [facility];  Service: Endoscopy;  Laterality: N/A;  . LYMPH NODE DISSECTION Left 04/12/2013   Procedure: LYMPH NODE DISSECTION;  Surgeon: [name], MD;  Location: [facility];  Service: Thoracic;  Laterality: Left;  . RIGHT/LEFT HEART CATH AND CORONARY ANGIOGRAPHY N/A 10/08/2016   Procedure: RIGHT/LEFT HEART CATH AND CORONARY ANGIOGRAPHY;  Surgeon: Burnell Blanks, MD;  Location: [facility];  Service: Cardiovascular;  Laterality: N/A;  . TONSILLECTOMY    . VESICOVAGINAL FISTULA CLOSURE W/ TAH  1971  . VIDEO ASSISTED THORACOSCOPY (VATS)/THOROCOTOMY Left 04/12/2013   Procedure: VIDEO ASSISTED  THORACOSCOPY (VATS)/THOROCOTOMY, WITH LEFT UPPER LOBE WEDGE RESECTION, CHEST WALL BIOPSY ;  Surgeon: [name], MD;  Location: [facility];  Service: Thoracic;  Laterality: Left;  Marland Kitchen VIDEO BRONCHOSCOPY N/A 04/12/2013   Procedure: VIDEO BRONCHOSCOPY;  Surgeon: [name], MD;  Location: [facility];  Service: Thoracic;  Laterality: N/A;    Social History   Socioeconomic History  . Marital status: Widowed    Spouse name: Not on file  . Number of children: 2  . Years of education: Not on file  . Highest education level: Not on file  Social Needs  . Financial resource strain: Not on file  . Food insecurity - worry: Not on file  . Food insecurity - inability: Not on file  . Transportation needs - medical: Not on file  . Transportation needs - non-medical: Not on file  Occupational History  . Occupation: Nurse, adult: [employer]  Tobacco Use  . Smoking status: Former Smoker    Packs/day: 1.00    Years: 35.00    Pack years: 35.00    Types: Cigarettes    Last attempt to quit: 01/07/1988    Years since quitting: 28.9  . Smokeless tobacco: Never Used  Substance and Sexual Activity  . Alcohol use: No    Comment: not since 1988  . Drug use: No  . Sexual activity: No  Other Topics Concern  . Not on file  Social History Narrative  . Not on file    Family History  Problem Relation Age of Onset  . Lung cancer Mother   . Coronary artery disease Son   . Heart attack Son 31  . Arthritis/Rheumatoid Maternal Grandmother   . Coronary artery disease Son   . Arthritis/Rheumatoid Son     ROS: no fevers or chills, productive cough, hemoptysis, dysphasia, odynophagia, melena, hematochezia, dysuria, hematuria, rash, seizure activity, orthopnea, PND, pedal edema, claudication. Remaining systems are negative.  Physical Exam: Well-developed well-nourished in no acute distress.  Skin is warm and dry.  HEENT is normal.  Neck is supple.  Chest diminished BS  Cardiovascular exam is regular  rate and rhythm.  Abdominal exam nontender or distended. No masses palpated. Extremities show no edema. neuro grossly intact   A/P  1 nonischemic cardiomyopathy-etiology remains unclear. No history of alcohol abuse. TSH normal. Catheterization revealed no significant coronary disease. Plan medical therapy.Continue Cozaar at present dose. Increase carvedilol to 6.25 mg twice a day. Continue to titrate cardiac medications. Once fully titrated repeat echocardiogram. If ejection fraction less than 35% would need to consider ICD.  2 dyspnea-I think this is likely multifactorial including history of lung cancer status post resection, COPD, obstructive sleep apnea and obesity hypoventilation syndrome. Note filling pressures were normal at time of recent catheterization. It may be worthwhile for her to see pulmonary to see if any further optimization of pulmonary  meds may help her symptoms.  3 hypertension-blood pressure is controlled.Advance carvedilol for cardiomyopathy as outlined.  4 coronary calcification-continue aspirin and statin.  5 hyperlipidemia-continue statin.  Kirk Ruths, MD

## 2016-12-02 ENCOUNTER — Encounter: Payer: Self-pay | Admitting: Cardiology

## 2016-12-02 ENCOUNTER — Ambulatory Visit: Payer: PPO | Admitting: Cardiology

## 2016-12-02 VITALS — BP 118/70 | HR 80 | Ht 62.0 in | Wt 183.0 lb

## 2016-12-02 DIAGNOSIS — I428 Other cardiomyopathies: Secondary | ICD-10-CM | POA: Diagnosis not present

## 2016-12-02 DIAGNOSIS — I10 Essential (primary) hypertension: Secondary | ICD-10-CM

## 2016-12-02 DIAGNOSIS — R0602 Shortness of breath: Secondary | ICD-10-CM | POA: Diagnosis not present

## 2016-12-02 DIAGNOSIS — E7800 Pure hypercholesterolemia, unspecified: Secondary | ICD-10-CM | POA: Diagnosis not present

## 2016-12-02 MED ORDER — CARVEDILOL 6.25 MG PO TABS: 3.1250 mg | ORAL_TABLET | Freq: Two times a day (BID) | ORAL | 3 refills | Status: DC

## 2016-12-02 NOTE — Patient Instructions (Addendum)
Medication Instructions:   INCREASE CARVEDILOL TO 6.25 MG TWICE DAILY= 2 OF THE 3.125 MG TABLETS TWICE DAILY  Testing/Procedures:  Your physician has requested that you have an echocardiogram. Echocardiography is a painless test that uses sound waves to create images of your heart. It provides your doctor with information about the size and shape of your heart and how well your heart's chambers and valves are working. This procedure takes approximately one hour. There are no restrictions for this procedure.SCHEDULE IN 3 MONTHS    Follow-Up:  Your physician recommends that you schedule a follow-up appointment in: 3 MONTHS WITH DR Stanford Breed AFTER ECHO COMPLETE   If you need a refill on your cardiac medications before your next appointment, please call your pharmacy.    SCHEDULE FOLLOW UP WITH DR Lamonte Sakai IN 4-6 WEEKS

## 2016-12-23 DIAGNOSIS — R232 Flushing: Secondary | ICD-10-CM | POA: Diagnosis not present

## 2016-12-23 DIAGNOSIS — M0589 Other rheumatoid arthritis with rheumatoid factor of multiple sites: Secondary | ICD-10-CM | POA: Diagnosis not present

## 2016-12-23 DIAGNOSIS — R0602 Shortness of breath: Secondary | ICD-10-CM | POA: Diagnosis not present

## 2016-12-23 DIAGNOSIS — E669 Obesity, unspecified: Secondary | ICD-10-CM | POA: Diagnosis not present

## 2016-12-23 DIAGNOSIS — Z6832 Body mass index (BMI) 32.0-32.9, adult: Secondary | ICD-10-CM | POA: Diagnosis not present

## 2016-12-23 DIAGNOSIS — Z79899 Other long term (current) drug therapy: Secondary | ICD-10-CM | POA: Diagnosis not present

## 2016-12-23 DIAGNOSIS — R29898 Other symptoms and signs involving the musculoskeletal system: Secondary | ICD-10-CM | POA: Diagnosis not present

## 2016-12-23 DIAGNOSIS — C3490 Malignant neoplasm of unspecified part of unspecified bronchus or lung: Secondary | ICD-10-CM | POA: Diagnosis not present

## 2016-12-23 DIAGNOSIS — M150 Primary generalized (osteo)arthritis: Secondary | ICD-10-CM | POA: Diagnosis not present

## 2016-12-23 DIAGNOSIS — M810 Age-related osteoporosis without current pathological fracture: Secondary | ICD-10-CM | POA: Diagnosis not present

## 2016-12-25 ENCOUNTER — Other Ambulatory Visit: Payer: Self-pay | Admitting: *Deleted

## 2016-12-25 ENCOUNTER — Telehealth: Payer: Self-pay | Admitting: Cardiology

## 2016-12-25 MED ORDER — CARVEDILOL 6.25 MG PO TABS: 6.2500 mg | ORAL_TABLET | Freq: Two times a day (BID) | ORAL | 3 refills | Status: DC

## 2016-12-25 NOTE — Telephone Encounter (Signed)
New message  Pt c/o medication issue:  1. Name of Medication: cardvidalol  2. How are you currently taking this medication (dosage and times per day)? 6.25mg  0.5 tablet 2x day  3. Are you having a reaction (difficulty breathing--STAT)? no  4. What is your medication issue? Pt said that the dosage is incorrect

## 2016-12-25 NOTE — Telephone Encounter (Signed)
Returned call after review of chart. At her last OV w Dr. Stanford Breed, pt was recommended to increase the carvedilol from 3.125mg  bid to 6.25mg  bid. She did this, however instructions on original bottle were to take 1/2 tablet BID (3.125mg ). She needed to have new Rx w correction sent to [facility]. I have done this for her, and she is aware if she has further needs or concerns to call.

## 2017-01-09 ENCOUNTER — Telehealth: Payer: Self-pay | Admitting: Cardiology

## 2017-01-09 MED ORDER — METOPROLOL SUCCINATE ER 25 MG PO TB24: 25.0000 mg | ORAL_TABLET | Freq: Every day | ORAL | 3 refills | Status: DC

## 2017-01-09 NOTE — Telephone Encounter (Signed)
Spoke with pt, Aware of dr crenshaw's recommendations. New script sent to the pharmacy  

## 2017-01-09 NOTE — Telephone Encounter (Signed)
Mrs.Witting is calling because she has been having shortness of Breath ever since she started Carvedilol . Wants to know if there is another medication that will not have this side effect that she can take . Please call

## 2017-01-09 NOTE — Telephone Encounter (Signed)
Returned the call to the patient. She stated that since she started the Carvedilol 3.125 bid, that she has developed shortness of breath. Once it was increased in December to 6.25 bid that it gradually got worse. She had read online that Carvedilol can cause shortness of breath and wonders if that is what it may be. She denies swelling and states that shortness of breath is normally on exertion. She is not able to check her blood pressure at home.   She would like to know if she can try a different medication other than the carvedilol.

## 2017-01-09 NOTE — Telephone Encounter (Signed)
Dc coreg and begin toprol 25 mg daily Kirk Ruths

## 2017-01-09 NOTE — Telephone Encounter (Signed)
Left a message to call back.

## 2017-01-12 ENCOUNTER — Encounter: Payer: Self-pay | Admitting: Emergency Medicine

## 2017-01-12 ENCOUNTER — Ambulatory Visit: Payer: PPO | Admitting: Emergency Medicine

## 2017-01-12 VITALS — BP 124/96 | HR 74 | Ht 62.0 in | Wt 183.0 lb

## 2017-01-12 DIAGNOSIS — R0600 Dyspnea, unspecified: Secondary | ICD-10-CM

## 2017-01-12 DIAGNOSIS — G4733 Obstructive sleep apnea (adult) (pediatric): Secondary | ICD-10-CM | POA: Diagnosis not present

## 2017-01-12 DIAGNOSIS — J449 Chronic obstructive pulmonary disease, unspecified: Secondary | ICD-10-CM

## 2017-01-12 DIAGNOSIS — C3412 Malignant neoplasm of upper lobe, left bronchus or lung: Secondary | ICD-10-CM

## 2017-01-12 NOTE — Assessment & Plan Note (Signed)
Untreated  

## 2017-01-12 NOTE — Assessment & Plan Note (Signed)
Not currently on bronchodilator therapy.  I suspect that her dyspnea is at least in part related to her underlying lung disease.  We will repeat her pulmonary function testing, compared with 2015.  I like to start her on scheduled daily bronchodilator therapy.  If she still has a bronchodilator response and we will probably choose a LABA / ICS

## 2017-01-12 NOTE — Assessment & Plan Note (Signed)
Following with Dr. Servando Snare with serial CT scans.  She has seen Dr. Irene Limbo as well.  She is unclear as to whether she is supposed to continue to follow-up with him.  Ive asked her to discuss it with Dr. Servando Snare to decide whether this is necessary.

## 2017-01-12 NOTE — Progress Notes (Signed)
ROV 06/10/16 -- follow-up visit for 76 year old woman with a history of obesity, uterine cancer, former tobacco with COPD and a positive bronchodilator response, rheumatoid arthritis (previously on methotrexate, off for years) on plaquenil, untreated obstructive sleep apnea. She underwent left upper lobe wedge resection for stage IB squamous cell lung cancer April 2015. Most recent CT scan of the chest was 04/17/16 that I personally reviewed. This shows no evidence of local recurrence or metastatic disease. She has done much better since losing weight. She is doing silver sneakers. Her exertional tolerance is better, no wheeze even with her workouts. She does not use BD's  Hospital F/u visit 01/12/17 --this is a follow-up visit for patient with obesity, history of uterine cancer, COPD, rheumatoid arthritis, untreated sleep apnea, left upper lobe wedge resection for squamous cell lung cancer is described above.  Since her last visit she has been evaluated for cardiac dysfunction.  She has a history of some mild nonobstructive coronary artery disease.  EF approximately 30%, being medically managed. She has been having some episodic exertional SOB when walking, noticed it around October. Cardiac cath did not show any intervention needed. 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 Her excessive sweating is better - she wonders if related to valsartan? She is now on losartan. Her coreg was changed to metoprolol. She is not currently on BD's.      EXAM:  Vitals:   01/12/17 1125  BP: (!) 124/96  Pulse: 74  SpO2: 94%  Weight: 183 lb (83 kg)  Height: 5\' 2"  (1.575 m)   Gen: Pleasant, obese, in no distress,  normal affect, in wheelchair   ENT: No lesions,  mouth clear,  oropharynx clear, no postnasal drip  Neck: No JVD, no TMG, no carotid bruits  Lungs: No use of accessory muscles, clear bilaterally, no wheeze.   Cardiovascular: RRR, heart sounds normal, no murmur or gallops, no peripheral edema  Musculoskeletal: No  deformities, no cyanosis or clubbing  Neuro: alert, non focal  Skin: Warm, no lesions or rashes   04/17/16 --  COMPARISON:  04/26/2015 chest CT.  FINDINGS: Cardiovascular: Normal heart size. No significant pericardial fluid/thickening. Left main, left anterior descending and right coronary atherosclerosis. Atherosclerotic nonaneurysmal thoracic aorta. Normal caliber pulmonary arteries.  Mediastinum/Nodes: No discrete thyroid nodules. Unremarkable esophagus. No pathologically enlarged axillary, mediastinal or gross hilar lymph nodes, noting limited sensitivity for the detection of hilar adenopathy on this noncontrast study.  Lungs/Pleura: No pneumothorax. No pleural effusion. Mild centrilobular and paraseptal emphysema. Status post left upper lobe wedge resection. Stable small focus of loculated pleural air in the peripheral wedge resection site (series 4/image 40). Stable mild bronchiectasis at the wedge resection site. Stable 3 mm pulmonary nodules in the posterior left upper lobe along the major fissure (series 4/ image 39) and peripheral apical right upper lobe (series 4/ image 21). Stable mild patchy subpleural reticulation with associated traction bronchiolectasis in both lungs, basilar predominant. No frank honeycombing. No acute consolidative airspace disease, lung masses or new significant pulmonary nodules.  Upper abdomen: Cholelithiasis.  Musculoskeletal: No aggressive appearing focal osseous lesions. Moderate thoracic spondylosis.  IMPRESSION: 1. No findings to suggest local tumor recurrence in the left upper lobe status post wedge resection. Stable small focus of loculated pleural air at the peripheral wedge resection site. 2. No evidence of metastatic disease in the chest. 3. Aortic atherosclerosis. Left main and two-vessel coronary atherosclerosis. 4. Mild emphysema.  COPD (chronic obstructive pulmonary disease) Not currently on bronchodilator  therapy.  I suspect that her  dyspnea is at least in part related to her underlying lung disease.  We will repeat her pulmonary function testing, compared with 2015.  I like to start her on scheduled daily bronchodilator therapy.  If she still has a bronchodilator response and we will probably choose a LABA / ICS  OSA (obstructive sleep apnea) Untreated  Lung cancer, left upper lobe Following with Dr. Servando Snare with serial CT scans.  She has seen Dr. Irene Limbo as well.  She is unclear as to whether she is supposed to continue to follow-up with him.  Ive asked her to discuss it with Dr. Servando Snare to decide whether this is necessary.  Baltazar Apo, MD, PhD 01/12/2017, 11:54 AM [facility] [phone] or if no answer [phone]

## 2017-01-12 NOTE — Patient Instructions (Addendum)
We will repeat your pulmonary function testing to compare with 2015.   Please continue current medications as you are taking them Please continue to follow with Dr. Servando Snare with interval repeat CT scans of your chest Follow with Dr Lamonte Sakai next available with pulmonary function testing on the same day.

## 2017-01-13 ENCOUNTER — Telehealth: Payer: Self-pay | Admitting: Emergency Medicine

## 2017-01-13 ENCOUNTER — Ambulatory Visit: Payer: PPO | Admitting: Emergency Medicine

## 2017-01-13 NOTE — Telephone Encounter (Signed)
PFT and OV has been scheduled for 02/05/17, as pt request that both apt be made on same day.  Nothing further is needed.

## 2017-01-15 ENCOUNTER — Other Ambulatory Visit: Payer: Self-pay | Admitting: Gastroenterology

## 2017-01-15 DIAGNOSIS — I10 Essential (primary) hypertension: Secondary | ICD-10-CM | POA: Diagnosis not present

## 2017-01-15 DIAGNOSIS — R1032 Left lower quadrant pain: Secondary | ICD-10-CM | POA: Diagnosis not present

## 2017-01-15 DIAGNOSIS — Z86010 Personal history of colonic polyps: Secondary | ICD-10-CM | POA: Diagnosis not present

## 2017-01-15 DIAGNOSIS — Z85118 Personal history of other malignant neoplasm of bronchus and lung: Secondary | ICD-10-CM | POA: Diagnosis not present

## 2017-01-15 DIAGNOSIS — M069 Rheumatoid arthritis, unspecified: Secondary | ICD-10-CM | POA: Diagnosis not present

## 2017-01-20 ENCOUNTER — Ambulatory Visit
Admission: RE | Admit: 2017-01-20 | Discharge: 2017-01-20 | Disposition: A | Discharge status: Home / Self Care | Payer: PPO | Source: Ambulatory Visit | Attending: Gastroenterology | Admitting: Gastroenterology

## 2017-01-20 DIAGNOSIS — R1032 Left lower quadrant pain: Secondary | ICD-10-CM

## 2017-01-20 DIAGNOSIS — K5790 Diverticulosis of intestine, part unspecified, without perforation or abscess without bleeding: Secondary | ICD-10-CM | POA: Diagnosis not present

## 2017-01-20 MED ORDER — IOPAMIDOL (ISOVUE-300) INJECTION 61%
100.0000 mL | Freq: Once | INTRAVENOUS | Status: AC | PRN
  Administered 2017-01-20: 100 mL via INTRAVENOUS

## 2017-01-21 ENCOUNTER — Telehealth: Payer: Self-pay | Admitting: Emergency Medicine

## 2017-01-21 DIAGNOSIS — R911 Solitary pulmonary nodule: Secondary | ICD-10-CM

## 2017-01-21 NOTE — Telephone Encounter (Signed)
Called pt letting her know we were going to order a CT chest with contrast to further look at the nodule that was seen.  Pt expressed understanding. Order placed for pt to have scan done.

## 2017-01-21 NOTE — Telephone Encounter (Signed)
Spoke with the pt  She states that she had ct abd done 01/20/17 and they found a new nodule  The report reads---2. **An incidental finding of potential clinical significance has been found. 1.3 cm low-density mass abutting the pleural surface at the medial right lung base, adjacent to the T10 vertebral body, incompletely imaged, new compared to most recent chest CT of 04/26/2015. Given patient's history of previous lung cancer, recommend nonemergent chest CT for further characterization.**  She is extremely anxious and wants to know what to do asap  RB- please advise thanks!

## 2017-01-21 NOTE — Telephone Encounter (Signed)
Based on that report, I believe we should perform a dedicated CT chest with contrast now to evaluate pulmonary nodule. Thanks.

## 2017-01-21 NOTE — Telephone Encounter (Signed)
Spoke with Vallarie Mare from [facility] who stated pt needed to have BMET done before CT with contrast could be done. Order placed. Nothing further needed.

## 2017-01-22 ENCOUNTER — Other Ambulatory Visit (INDEPENDENT_AMBULATORY_CARE_PROVIDER_SITE_OTHER): Payer: PPO

## 2017-01-22 ENCOUNTER — Ambulatory Visit (INDEPENDENT_AMBULATORY_CARE_PROVIDER_SITE_OTHER)
Admission: RE | Admit: 2017-01-22 | Discharge: 2017-01-22 | Disposition: A | Discharge status: Home / Self Care | Payer: PPO | Source: Ambulatory Visit | Attending: Emergency Medicine | Admitting: Emergency Medicine

## 2017-01-22 DIAGNOSIS — R911 Solitary pulmonary nodule: Secondary | ICD-10-CM

## 2017-01-22 LAB — BASIC METABOLIC PANEL
BUN: 17 mg/dL (ref 6–23)
CO2: 30 mEq/L (ref 19–32)
Calcium: 8.6 mg/dL (ref 8.4–10.5)
Chloride: 105 mEq/L (ref 96–112)
Creatinine, Ser: 0.58 mg/dL (ref 0.40–1.20)
GFR: 107.34 mL/min (ref >60.00)
Glucose, Bld: 109 mg/dL — ABNORMAL HIGH (ref 70–99)
Potassium: 4 mEq/L (ref 3.5–5.1)
Sodium: 141 mEq/L (ref 135–145)

## 2017-01-22 MED ORDER — IOPAMIDOL (ISOVUE-300) INJECTION 61%
80.0000 mL | Freq: Once | INTRAVENOUS | Status: AC | PRN
  Administered 2017-01-22: 80 mL via INTRAVENOUS

## 2017-01-22 NOTE — Telephone Encounter (Signed)
Spoke with pt. She is aware of her results. Pt wants to speak to RB about this. She is confused and was very rude while speaking to her.  RB - please contact pt. Thanks.

## 2017-01-22 NOTE — Telephone Encounter (Signed)
Please let her know that I reviewed the CT Chest. There has been no significant change, no new nodules, compared with 04/2016. This is good news.

## 2017-01-22 NOTE — Telephone Encounter (Signed)
Dr. Lamonte Sakai, pt's CT results are in epic if you can please review it so we can let pt know the information.  Thanks!

## 2017-01-23 DIAGNOSIS — Z86010 Personal history of colonic polyps: Secondary | ICD-10-CM | POA: Diagnosis not present

## 2017-01-23 DIAGNOSIS — C3492 Malignant neoplasm of unspecified part of left bronchus or lung: Secondary | ICD-10-CM | POA: Diagnosis not present

## 2017-01-23 DIAGNOSIS — G4733 Obstructive sleep apnea (adult) (pediatric): Secondary | ICD-10-CM | POA: Diagnosis not present

## 2017-01-23 DIAGNOSIS — K5792 Diverticulitis of intestine, part unspecified, without perforation or abscess without bleeding: Secondary | ICD-10-CM | POA: Diagnosis not present

## 2017-01-23 DIAGNOSIS — K219 Gastro-esophageal reflux disease without esophagitis: Secondary | ICD-10-CM | POA: Diagnosis not present

## 2017-01-23 DIAGNOSIS — M069 Rheumatoid arthritis, unspecified: Secondary | ICD-10-CM | POA: Diagnosis not present

## 2017-01-28 NOTE — Telephone Encounter (Signed)
I reviewed the scan results with the patient. She understands.

## 2017-01-29 DIAGNOSIS — H524 Presbyopia: Secondary | ICD-10-CM | POA: Diagnosis not present

## 2017-01-29 DIAGNOSIS — H52223 Regular astigmatism, bilateral: Secondary | ICD-10-CM | POA: Diagnosis not present

## 2017-02-03 ENCOUNTER — Ambulatory Visit: Payer: PPO | Admitting: Emergency Medicine

## 2017-02-05 ENCOUNTER — Ambulatory Visit: Payer: PPO | Admitting: Emergency Medicine

## 2017-02-05 ENCOUNTER — Encounter: Payer: Self-pay | Admitting: Emergency Medicine

## 2017-02-05 ENCOUNTER — Ambulatory Visit (INDEPENDENT_AMBULATORY_CARE_PROVIDER_SITE_OTHER): Payer: PPO | Admitting: Emergency Medicine

## 2017-02-05 DIAGNOSIS — C3412 Malignant neoplasm of upper lobe, left bronchus or lung: Secondary | ICD-10-CM

## 2017-02-05 DIAGNOSIS — G4733 Obstructive sleep apnea (adult) (pediatric): Secondary | ICD-10-CM | POA: Diagnosis not present

## 2017-02-05 DIAGNOSIS — J449 Chronic obstructive pulmonary disease, unspecified: Secondary | ICD-10-CM

## 2017-02-05 DIAGNOSIS — R0600 Dyspnea, unspecified: Secondary | ICD-10-CM | POA: Diagnosis not present

## 2017-02-05 LAB — PULMONARY FUNCTION TEST
DL/VA % pred: 105 %
DL/VA: 4.79 ml/min/mmHg/L
DLCO unc % pred: 58 %
DLCO unc: 12.67 ml/min/mmHg
FEF 25-75 Post: 0.63 L/sec
FEF 25-75 Pre: 0.45 L/sec
FEF2575-%Change-Post: 40 %
FEF2575-%Pred-Post: 42 %
FEF2575-%Pred-Pre: 30 %
FEV1-%Change-Post: 13 %
FEV1-%Pred-Post: 57 %
FEV1-%Pred-Pre: 50 %
FEV1-Post: 1.08 L
FEV1-Pre: 0.95 L
FEV1FVC-%Change-Post: 6 %
FEV1FVC-%Pred-Pre: 77 %
FEV6-%Change-Post: 7 %
FEV6-%Pred-Post: 71 %
FEV6-%Pred-Pre: 66 %
FEV6-Post: 1.72 L
FEV6-Pre: 1.6 L
FEV6FVC-%Change-Post: 0 %
FEV6FVC-%Pred-Post: 104 %
FEV6FVC-%Pred-Pre: 103 %
FVC-%Change-Post: 6 %
FVC-%Pred-Post: 69 %
FVC-%Pred-Pre: 64 %
FVC-Post: 1.74 L
FVC-Pre: 1.63 L
Post FEV1/FVC ratio: 62 %
Post FEV6/FVC ratio: 99 %
Pre FEV1/FVC ratio: 58 %
Pre FEV6/FVC Ratio: 98 %
RV % pred: 123 %
RV: 2.73 L
TLC % pred: 91 %
TLC: 4.33 L

## 2017-02-05 NOTE — Assessment & Plan Note (Signed)
Following with serial CT scans of the chest.  CT was done early on 01/22/17 due to suspicious finding on prior CT abdomen.  On comparison the CT chest does not show any new nodular disease compared with prior.  She is going to follow with Dr. Servando Snare as planned

## 2017-02-05 NOTE — Patient Instructions (Addendum)
We will try starting Symbicort 160/4.5 mcg, 2 puffs twice a day.  Remember to rinse and gargle after using this medication. Please call our office in 1 month to know if you have benefited from the Symbicort and would like [place] to call it into your pharmacy.  Follow with Dr Lamonte Sakai in 3 months or sooner if you have any problems.

## 2017-02-05 NOTE — Progress Notes (Signed)
PFT completed today 02/05/17.

## 2017-02-05 NOTE — Progress Notes (Signed)
Patient seen in the office today and instructed on use of Symbicort.  Patient expressed understanding and demonstrated technique. ° °

## 2017-02-05 NOTE — Progress Notes (Signed)
HPI:  ROV 06/10/16 -- follow-up visit for 77 year old woman with a history of obesity, uterine cancer, former tobacco with COPD and a positive bronchodilator response, rheumatoid arthritis (previously on methotrexate, off for years) on plaquenil, untreated obstructive sleep apnea. She underwent left upper lobe wedge resection for stage IB squamous cell lung cancer April 2015. Most recent CT scan of the chest was 04/17/16 that I personally reviewed. This shows no evidence of local recurrence or metastatic disease. She has done much better since losing weight. She is doing silver sneakers. Her exertional tolerance is better, no wheeze even with her workouts. She does not use BD's  Hospital F/u visit 01/12/17 --this is a follow-up visit for patient with obesity, history of uterine cancer, COPD, rheumatoid arthritis, untreated sleep apnea, left upper lobe wedge resection for squamous cell lung cancer is described above.  Since her last visit she has been evaluated for cardiac dysfunction.  She has a history of some mild nonobstructive coronary artery disease.  EF approximately 30%, being medically managed. She has been having some episodic exertional SOB when walking, noticed it around October. Cardiac cath did not show any intervention needed. 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 Her excessive sweating is better - she wonders if related to valsartan? She is now on losartan. Her coreg was changed to metoprolol. She is not currently on BD's.    ROV 02/05/17 --77 year old woman with a history of obesity, uterine cancer, squamous cell lung cancer status post left upper lobe wedge resection, rheumatoid arthritis, untreated sleep apnea, hypertension and coronary disease.  We follow her for COPD. I ordered a CT chest 01/22/17 given a question on CT abd of a new pulmonary nodule. I have reviewed - the CT chest is stable. She underwent PFT today that I have reviewed. She has severe obstruction, borderline BD response, normal  volumes, decreased DLCO that corrects to normal range for alveolar volume.     EXAM:  Vitals:   02/05/17 1323 02/05/17 1326  BP:  130/80  Pulse:  76  SpO2:  96%  Weight: 182 lb (82.6 kg)   Height: 5\' 2"  (1.575 m)    Gen: Pleasant, obese, in no distress,  normal affect, in wheelchair   ENT: No lesions,  mouth clear,  oropharynx clear, no postnasal drip  Neck: No JVD, no TMG, no carotid bruits  Lungs: No use of accessory muscles, clear bilaterally, no wheeze.   Cardiovascular: RRR, heart sounds normal, no murmur or gallops, no peripheral edema  Musculoskeletal: No deformities, no cyanosis or clubbing  Neuro: alert, non focal  Skin: Warm, no lesions or rashes    Ct chest 01/22/17 --  COMPARISON:  04/17/2016.  FINDINGS: Cardiovascular: The heart size is normal. No pericardial effusion. Coronary artery calcification is evident. Atherosclerotic calcification is noted in the wall of the thoracic aorta. Pulmonary scratches that prominence of the main pulmonary arteries (3.0 cm diameter on the right and 3.2 cm diameter on the left) suggests pulmonary arterial hypertension.  Mediastinum/Nodes: No mediastinal lymphadenopathy. There is no hilar lymphadenopathy. The esophagus has normal imaging features. There is no axillary lymphadenopathy.  Lungs/Pleura: Biapical pleural-parenchymal scarring again noted. 3 mm left apical nodule (image 23 series 3) is unchanged. 3 mm perifissural nodule in the left lung seen on image 52 today is stable. Surgical scarring in the posterior left upper lobe is stable. Chronic atelectasis or scarring in the right middle lobe is unchanged in the interval.  No focal airspace consolidation. No pulmonary edema or pleural effusion. Circumferential  bronchial wall thickening with changes of emphysema again noted.  Upper Abdomen: Small hypoattenuating lesion in the dome of the left liver is stable and compatible with a cyst. 1.4 x 1.6 cm  left adrenal nodule was largely obscured by motion artifact on the previous study and less well demonstrated on earlier studies due to the thicker slice collimation used on those prior exams. This is unchanged in the interval and also comparing back to 04/13/2014. Imaging features most consistent with benign adrenal adenoma. Diverticular changes are noted in the abdominal segments of the colon. Possible tiny calcified gallstones.  Musculoskeletal: Bone windows reveal no worrisome lytic or sclerotic osseous lesions.  IMPRESSION: 1. Stable exam.  No new or progressive findings. 2. Stable appearance of surgical scarring posterior left upper lobe. 3. Prominence of the main pulmonary arteries raise the question of pulmonary arterial hypertension. 4.  Aortic Atherosclerois (ICD10-170.0) 5.  Emphysema. (ICD10-J43.9)   OSA (obstructive sleep apnea) Currently untreated  Lung cancer, left upper lobe Following with serial CT scans of the chest.  CT was done early on 01/22/17 due to suspicious finding on prior CT abdomen.  On comparison the CT chest does not show any new nodular disease compared with prior.  She is going to follow with Dr. Servando Snare as planned  COPD (chronic obstructive pulmonary disease) Pulmonary function testing shows severe obstruction.  It has not changed significantly compared with 2015.  I believe would be reasonable to retry bronchodilator.  She has been on Advair in the past but did not tolerate due to cough.  Cost will be another consideration.  We will try Symbicort see if she benefits, tolerates.  If so we will order through her pharmacy.  Hopefully she will be able to afford this if she finds it useful.  Baltazar Apo, MD, PhD 02/05/2017, 1:58 PM [facility] [phone] or if no answer [phone]

## 2017-02-05 NOTE — Assessment & Plan Note (Signed)
Currently untreated

## 2017-02-05 NOTE — Assessment & Plan Note (Signed)
Pulmonary function testing shows severe obstruction.  It has not changed significantly compared with 2015.  I believe would be reasonable to retry bronchodilator.  She has been on Advair in the past but did not tolerate due to cough.  Cost will be another consideration.  We will try Symbicort see if she benefits, tolerates.  If so we will order through her pharmacy.  Hopefully she will be able to afford this if she finds it useful.

## 2017-02-17 DIAGNOSIS — L814 Other melanin hyperpigmentation: Secondary | ICD-10-CM | POA: Diagnosis not present

## 2017-02-17 DIAGNOSIS — Z85828 Personal history of other malignant neoplasm of skin: Secondary | ICD-10-CM | POA: Diagnosis not present

## 2017-02-17 DIAGNOSIS — D2262 Melanocytic nevi of left upper limb, including shoulder: Secondary | ICD-10-CM | POA: Diagnosis not present

## 2017-02-17 DIAGNOSIS — L821 Other seborrheic keratosis: Secondary | ICD-10-CM | POA: Diagnosis not present

## 2017-02-17 DIAGNOSIS — L720 Epidermal cyst: Secondary | ICD-10-CM | POA: Diagnosis not present

## 2017-02-17 DIAGNOSIS — L298 Other pruritus: Secondary | ICD-10-CM | POA: Diagnosis not present

## 2017-03-03 DIAGNOSIS — M069 Rheumatoid arthritis, unspecified: Secondary | ICD-10-CM | POA: Diagnosis not present

## 2017-03-03 DIAGNOSIS — C3492 Malignant neoplasm of unspecified part of left bronchus or lung: Secondary | ICD-10-CM | POA: Diagnosis not present

## 2017-03-03 DIAGNOSIS — K5792 Diverticulitis of intestine, part unspecified, without perforation or abscess without bleeding: Secondary | ICD-10-CM | POA: Diagnosis not present

## 2017-03-03 DIAGNOSIS — Z86010 Personal history of colonic polyps: Secondary | ICD-10-CM | POA: Diagnosis not present

## 2017-03-03 NOTE — Progress Notes (Signed)
HPI: FU hyperlipidemia, CM. Pt withH/O dyspnea; also with h/o lung ca, COPD and OSA. Echocardiogram in December of 2012 showed an ejection fraction of 99991111, grade 1 diastolic dysfunction and mild left atrial enlargement. Chest CT 4/18 showed no recurrence of lung ca, LM and 2 vessel atherosclerosis. TSH 07/29/2016 1.269. Echocardiogram September 2018 showed ejection fraction 35-40%, mild left ventricular hypertrophy. I reviewed this and felt ejection fraction likely 30%. Cardiac catheterization October 2018 showed mild nonobstructive coronary disease and normal filling pressures.   Echocardiogram repeated February 2019 and showed ejection fraction 30-35% and mild left atrial enlargement.  Since last seenthe patient has dyspnea with more extreme activities but not with routine activities. It is relieved with rest. It is not associated with chest pain. There is no orthopnea, PND or pedal edema. There is no syncope or palpitations. There is no exertional chest pain.   Current Outpatient Medications  Medication Sig Dispense Refill  . aspirin EC 81 MG tablet Take 81 mg by mouth daily.    Marland Kitchen atorvastatin (LIPITOR) 20 MG tablet Take 1 tablet (20 mg total) by mouth daily. 90 tablet 3  . budesonide-formoterol (SYMBICORT) 160-4.5 MCG/ACT inhaler Inhale 2 puffs into the lungs 2 (two) times daily.    Marland Kitchen denosumab (PROLIA) 60 MG/ML SOLN injection Inject 60 mg into the skin every 6 (six) months. Administer in upper arm, thigh, or abdomen    . leflunomide (ARAVA) 20 MG tablet Take 20 mg by mouth daily.    Marland Kitchen losartan (COZAAR) 100 MG tablet Take 100 mg by mouth daily.     . metoprolol succinate (TOPROL XL) 25 MG 24 hr tablet Take 1 tablet (25 mg total) by mouth daily. 90 tablet 3  . Omega-3 Fatty Acids (FISH OIL) 600 MG CAPS Take 600 mg by mouth daily. Reported on 06/05/2015    . omeprazole (PRILOSEC) 20 MG capsule Take 20 mg by mouth daily as needed.    . Venlafaxine HCl 75 MG TB24 Take 75 mg by mouth  daily.     . Calcium-Magnesium-Vitamin D (CALCIUM 1200+D3 PO) Take 1 tablet by mouth daily.      No current facility-administered medications for this visit.      Past Medical History:  Diagnosis Date  . Abnormal CT scan   . Allergic rhinitis   . Anemia 2015   after lung surgery  . Complication of anesthesia    Fentanyl allergy, "claustrophobia"  . COPD (chronic obstructive pulmonary disease) ([place])   . Depression   . Diaphoresis 07/30/2015   Excessive sweating with little exertion  . Difficult intravenous access   . GERD (gastroesophageal reflux disease)    In the past  . Hyperlipidemia   . Hypertension   . Left bundle branch block   . Lung cancer ([place]) 2015   left upper lobe wedge removed-no further tx.  . Neuropathy    Tingling in arms, fingers, and feet (Since 06/15/16)  . Obesity   . OSA (obstructive sleep apnea)    denies.  . Osteoarthritis    ra also  . Osteoporosis   . Rheumatoid arthritis(714.0)    Dr. Stark Jock follows  . SOB (shortness of breath) on exertion   . Uterine cancer ([place]) 1971   tx surgical    Past Surgical History:  Procedure Laterality Date  . ABDOMINAL HYSTERECTOMY     in situ carcinoma partial  . CARDIAC CATHETERIZATION  01/27/11   minor non-obs CAD, NL EF, mild pulm HTN  .  CATARACT EXTRACTION, BILATERAL     last done 12'16-  . COLONOSCOPY W/ BIOPSIES AND POLYPECTOMY    . COLONOSCOPY WITH PROPOFOL N/A 02/13/2015   Procedure: COLONOSCOPY WITH PROPOFOL;  Surgeon: Laurence Spates, MD;  Location: [facility];  Service: Endoscopy;  Laterality: N/A;  . ESOPHAGOGASTRODUODENOSCOPY (EGD) WITH PROPOFOL N/A 09/30/2016   Procedure: ESOPHAGOGASTRODUODENOSCOPY (EGD) WITH PROPOFOL;  Surgeon: Laurence Spates, MD;  Location: [facility];  Service: Endoscopy;  Laterality: N/A;  . LYMPH NODE DISSECTION Left 04/12/2013   Procedure: LYMPH NODE DISSECTION;  Surgeon: [name], MD;  Location: [facility];  Service: Thoracic;  Laterality: Left;  . RIGHT/LEFT HEART  CATH AND CORONARY ANGIOGRAPHY N/A 10/08/2016   Procedure: RIGHT/LEFT HEART CATH AND CORONARY ANGIOGRAPHY;  Surgeon: Burnell Blanks, MD;  Location: [facility];  Service: Cardiovascular;  Laterality: N/A;  . TONSILLECTOMY    . VESICOVAGINAL FISTULA CLOSURE W/ TAH  1971  . VIDEO ASSISTED THORACOSCOPY (VATS)/THOROCOTOMY Left 04/12/2013   Procedure: VIDEO ASSISTED THORACOSCOPY (VATS)/THOROCOTOMY, WITH LEFT UPPER LOBE WEDGE RESECTION, CHEST WALL BIOPSY ;  Surgeon: [name], MD;  Location: [facility];  Service: Thoracic;  Laterality: Left;  Marland Kitchen VIDEO BRONCHOSCOPY N/A 04/12/2013   Procedure: VIDEO BRONCHOSCOPY;  Surgeon: [name], MD;  [facility];  Service: Thoracic;  Laterality: N/A;    Social History   Socioeconomic History  . Marital status: Widowed    Spouse name: Not on file  . Number of children: 2  . Years of education: Not on file  . Highest education level: Not on file  Social Needs  . Financial resource strain: Not on file  . Food insecurity - worry: Not on file  . Food insecurity - inability: Not on file  . Transportation needs - medical: Not on file  . Transportation needs - non-medical: Not on file  Occupational History  . Occupation: Nurse, adult: [employer]  Tobacco Use  . Smoking status: Former Smoker    Packs/day: 1.00    Years: 35.00    Pack years: 35.00    Types: Cigarettes    Last attempt to quit: 01/07/1988    Years since quitting: 29.1  . Smokeless tobacco: Never Used  Substance and Sexual Activity  . Alcohol use: No    Comment: not since 1988  . Drug use: No  . Sexual activity: No  Other Topics Concern  . Not on file  Social History Narrative  . Not on file    Family History  Problem Relation Age of Onset  . Lung cancer Mother   . Coronary artery disease Son   . Heart attack Son 1  . Arthritis/Rheumatoid Maternal Grandmother   . Coronary artery disease Son   . Arthritis/Rheumatoid Son     ROS: no fevers or chills,  productive cough, hemoptysis, dysphasia, odynophagia, melena, hematochezia, dysuria, hematuria, rash, seizure activity, orthopnea, PND, pedal edema, claudication. Remaining systems are negative.  Physical Exam: Well-developed well-nourished in no acute distress.  Skin is warm and dry.  HEENT is normal.  Neck is supple.  Chest is clear to auscultation with normal expansion.  Cardiovascular exam is regular rate and rhythm.  Abdominal exam nontender or distended. No masses palpated. Extremities show no edema. neuro grossly intact   A/P  1 nonischemic cardiomyopathy-plan to continue ARB.  Increase Toprol to 50 mg daily.  Ejection fraction remains less than 35%.  I will arrange electrophysiology evaluation for consideration of ICD.  2 hypertension-blood pressure is controlled.  Continue present medications.  3 hyperlipidemia-continue statin.  4 dyspnea-I feel that this is multifactorial including history of lung cancer status post prior resection, COPD, obstructive sleep apnea and obesity hypoventilation syndrome.  Previous filling pressures were normal at time of prior catheterization.  Kirk Ruths, MD

## 2017-03-04 ENCOUNTER — Other Ambulatory Visit (HOSPITAL_COMMUNITY): Payer: PPO

## 2017-03-10 ENCOUNTER — Other Ambulatory Visit: Payer: Self-pay

## 2017-03-10 ENCOUNTER — Ambulatory Visit (HOSPITAL_COMMUNITY): Payer: PPO | Attending: Cardiovascular Disease

## 2017-03-10 DIAGNOSIS — M069 Rheumatoid arthritis, unspecified: Secondary | ICD-10-CM | POA: Insufficient documentation

## 2017-03-10 DIAGNOSIS — R0602 Shortness of breath: Secondary | ICD-10-CM | POA: Insufficient documentation

## 2017-03-10 DIAGNOSIS — E785 Hyperlipidemia, unspecified: Secondary | ICD-10-CM | POA: Insufficient documentation

## 2017-03-10 DIAGNOSIS — D649 Anemia, unspecified: Secondary | ICD-10-CM | POA: Diagnosis not present

## 2017-03-10 DIAGNOSIS — G4733 Obstructive sleep apnea (adult) (pediatric): Secondary | ICD-10-CM | POA: Diagnosis not present

## 2017-03-10 DIAGNOSIS — I10 Essential (primary) hypertension: Secondary | ICD-10-CM | POA: Diagnosis not present

## 2017-03-10 DIAGNOSIS — I428 Other cardiomyopathies: Secondary | ICD-10-CM | POA: Diagnosis not present

## 2017-03-10 DIAGNOSIS — G629 Polyneuropathy, unspecified: Secondary | ICD-10-CM | POA: Diagnosis not present

## 2017-03-10 DIAGNOSIS — I447 Left bundle-branch block, unspecified: Secondary | ICD-10-CM | POA: Insufficient documentation

## 2017-03-10 DIAGNOSIS — J449 Chronic obstructive pulmonary disease, unspecified: Secondary | ICD-10-CM | POA: Diagnosis not present

## 2017-03-11 ENCOUNTER — Ambulatory Visit (INDEPENDENT_AMBULATORY_CARE_PROVIDER_SITE_OTHER): Payer: PPO | Admitting: Cardiology

## 2017-03-11 ENCOUNTER — Encounter: Payer: Self-pay | Admitting: Cardiology

## 2017-03-11 VITALS — BP 132/70 | HR 70 | Ht 62.0 in | Wt 182.0 lb

## 2017-03-11 DIAGNOSIS — I428 Other cardiomyopathies: Secondary | ICD-10-CM

## 2017-03-11 DIAGNOSIS — I10 Essential (primary) hypertension: Secondary | ICD-10-CM

## 2017-03-11 DIAGNOSIS — E7800 Pure hypercholesterolemia, unspecified: Secondary | ICD-10-CM | POA: Diagnosis not present

## 2017-03-11 MED ORDER — METOPROLOL SUCCINATE ER 50 MG PO TB24: 50.0000 mg | ORAL_TABLET | Freq: Every day | ORAL | 3 refills | Status: DC

## 2017-03-11 NOTE — Patient Instructions (Signed)
Medication Instructions:   INCREASE METOPROLOL TO 50 MG ONCE DAILY= 2 OF THE 25 MG TABLETS ONCE DAILY  Follow-Up:  Your physician wants you to follow-up in: [place] will receive a reminder letter in the mail two months in advance. If you don't receive a letter, please call our office to schedule the follow-up appointment.   If you need a refill on your cardiac medications before your next appointment, please call your pharmacy.

## 2017-03-13 ENCOUNTER — Telehealth: Payer: Self-pay | Admitting: Cardiology

## 2017-03-13 DIAGNOSIS — I428 Other cardiomyopathies: Secondary | ICD-10-CM

## 2017-03-13 NOTE — Telephone Encounter (Signed)
Please arrange EP eval for consideration of ICD Kirk Ruths

## 2017-03-13 NOTE — Telephone Encounter (Signed)
Left a message to call back.

## 2017-03-13 NOTE — Telephone Encounter (Signed)
Taylor Berry is calling because she was to call back to let Dr. Stanford Breed know that she would like to have the appt set up for a defibrillator .  Please call   Thanks

## 2017-03-13 NOTE — Telephone Encounter (Signed)
Patient called in stating that she would like to have a referral to an EP provider to discuss a possible ICD. Message has been routed.

## 2017-03-16 NOTE — Telephone Encounter (Signed)
Patient has been made aware of recommendations and a referral has been placed. Scheduling has been notified.

## 2017-03-16 NOTE — Telephone Encounter (Signed)
Left a message to call back.

## 2017-03-19 ENCOUNTER — Telehealth: Payer: Self-pay | Admitting: Cardiology

## 2017-03-19 NOTE — Telephone Encounter (Signed)
Patient made aware that the referral has been placed and that someone from scheduling should call her to set the appointment up for her.

## 2017-03-19 NOTE — Telephone Encounter (Signed)
Patient calling, states that she has not heard anything in regards to update on referral for implant

## 2017-03-24 ENCOUNTER — Other Ambulatory Visit: Payer: Self-pay | Admitting: *Deleted

## 2017-03-24 DIAGNOSIS — C3490 Malignant neoplasm of unspecified part of unspecified bronchus or lung: Secondary | ICD-10-CM | POA: Diagnosis not present

## 2017-03-24 DIAGNOSIS — M150 Primary generalized (osteo)arthritis: Secondary | ICD-10-CM | POA: Diagnosis not present

## 2017-03-24 DIAGNOSIS — E669 Obesity, unspecified: Secondary | ICD-10-CM | POA: Diagnosis not present

## 2017-03-24 DIAGNOSIS — R29898 Other symptoms and signs involving the musculoskeletal system: Secondary | ICD-10-CM | POA: Diagnosis not present

## 2017-03-24 DIAGNOSIS — R0602 Shortness of breath: Secondary | ICD-10-CM | POA: Diagnosis not present

## 2017-03-24 DIAGNOSIS — Z85118 Personal history of other malignant neoplasm of bronchus and lung: Secondary | ICD-10-CM

## 2017-03-24 DIAGNOSIS — Z79899 Other long term (current) drug therapy: Secondary | ICD-10-CM | POA: Diagnosis not present

## 2017-03-24 DIAGNOSIS — M810 Age-related osteoporosis without current pathological fracture: Secondary | ICD-10-CM | POA: Diagnosis not present

## 2017-03-24 DIAGNOSIS — M0589 Other rheumatoid arthritis with rheumatoid factor of multiple sites: Secondary | ICD-10-CM | POA: Diagnosis not present

## 2017-03-24 DIAGNOSIS — Z6831 Body mass index (BMI) 31.0-31.9, adult: Secondary | ICD-10-CM | POA: Diagnosis not present

## 2017-03-24 DIAGNOSIS — R232 Flushing: Secondary | ICD-10-CM | POA: Diagnosis not present

## 2017-04-06 ENCOUNTER — Institutional Professional Consult (permissible substitution): Payer: PPO | Admitting: Cardiology

## 2017-04-07 ENCOUNTER — Ambulatory Visit: Payer: PPO | Admitting: Cardiology

## 2017-04-07 ENCOUNTER — Encounter: Payer: Self-pay | Admitting: Cardiology

## 2017-04-07 ENCOUNTER — Ambulatory Visit: Payer: PPO | Admitting: Sports Medicine

## 2017-04-07 ENCOUNTER — Encounter: Payer: Self-pay | Admitting: Cardiothoracic Surgery

## 2017-04-07 ENCOUNTER — Ambulatory Visit: Payer: PPO | Admitting: Cardiothoracic Surgery

## 2017-04-07 ENCOUNTER — Other Ambulatory Visit: Payer: PPO

## 2017-04-07 ENCOUNTER — Other Ambulatory Visit: Payer: Self-pay

## 2017-04-07 VITALS — BP 148/88 | HR 80 | Resp 18 | Ht 62.0 in | Wt 179.0 lb

## 2017-04-07 VITALS — BP 134/72 | HR 83 | Ht 62.0 in | Wt 179.8 lb

## 2017-04-07 DIAGNOSIS — I10 Essential (primary) hypertension: Secondary | ICD-10-CM

## 2017-04-07 DIAGNOSIS — Z8541 Personal history of malignant neoplasm of cervix uteri: Secondary | ICD-10-CM | POA: Diagnosis not present

## 2017-04-07 DIAGNOSIS — I428 Other cardiomyopathies: Secondary | ICD-10-CM | POA: Diagnosis not present

## 2017-04-07 DIAGNOSIS — Z85118 Personal history of other malignant neoplasm of bronchus and lung: Secondary | ICD-10-CM

## 2017-04-07 DIAGNOSIS — E785 Hyperlipidemia, unspecified: Secondary | ICD-10-CM | POA: Diagnosis not present

## 2017-04-07 NOTE — Progress Notes (Signed)
Electrophysiology Office Note   Date:  04/07/2017   ID:  Taylor, Berry Oct 18, 1940, MRN [id number]  PCP:  Kelton Pillar, MD  Cardiologist:  Stanford Breed Primary Electrophysiologist:  Brealyn Baril Meredith Leeds, MD    Chief Complaint  Patient presents with  . Advice Only    Non-ischemic cardiomyopathy/Left bundle branch block     History of Present Illness: Taylor Berry is a 77 y.o. female who is being seen today for the evaluation of CHF at the request of Kelton Pillar, MD. Presenting today for electrophysiology evaluation.  She has a history of hyperlipidemia, nonischemic cardiomyopathy, lung cancer, COPD, and OSA.  Echo September 2018 showed an EF of 35-40% with mild LVH.  Cardiac cath October 2018 showed nonobstructive disease and normal filling pressures.  Repeat echo 2019 February showed an EF of 30-35%.  She has dyspnea with activity.  The dyspnea is relieved by rest.  There is no associated chest pain, orthopnea, PND or edema.  No syncope or palpitations.  Today, she denies symptoms of palpitations, chest pain, shortness of breath, orthopnea, PND, lower extremity edema, claudication, dizziness, presyncope, syncope, bleeding, or neurologic sequela. The patient is tolerating medications without difficulties.    Past Medical History:  Diagnosis Date  . Abnormal CT scan   . Allergic rhinitis   . Anemia 2015   after lung surgery  . Complication of anesthesia    Fentanyl allergy, "claustrophobia"  . COPD (chronic obstructive pulmonary disease) ([place])   . Depression   . Diaphoresis 07/30/2015   Excessive sweating with little exertion  . Difficult intravenous access   . GERD (gastroesophageal reflux disease)    In the past  . Hyperlipidemia   . Hypertension   . Left bundle branch block   . Lung cancer ([place]) 2015   left upper lobe wedge removed-no further tx.  . Neuropathy    Tingling in arms, fingers, and feet (Since 06/15/16)  . Obesity   . OSA (obstructive sleep  apnea)    denies.  . Osteoarthritis    ra also  . Osteoporosis   . Rheumatoid arthritis(714.0)    Dr. Stark Jock follows  . SOB (shortness of breath) on exertion   . Uterine cancer ([place]) 1971   tx surgical   Past Surgical History:  Procedure Laterality Date  . ABDOMINAL HYSTERECTOMY     in situ carcinoma partial  . CARDIAC CATHETERIZATION  01/27/11   minor non-obs CAD, NL EF, mild pulm HTN  . CATARACT EXTRACTION, BILATERAL     last done 12'16-  . COLONOSCOPY W/ BIOPSIES AND POLYPECTOMY    . COLONOSCOPY WITH PROPOFOL N/A 02/13/2015   Procedure: COLONOSCOPY WITH PROPOFOL;  Surgeon: Laurence Spates, MD;  [facility];  Service: Endoscopy;  Laterality: N/A;  . ESOPHAGOGASTRODUODENOSCOPY (EGD) WITH PROPOFOL N/A 09/30/2016   Procedure: ESOPHAGOGASTRODUODENOSCOPY (EGD) WITH PROPOFOL;  Surgeon: Laurence Spates, MD;  [facility];  Service: Endoscopy;  Laterality: N/A;  . LYMPH NODE DISSECTION Left 04/12/2013   Procedure: LYMPH NODE DISSECTION;  Surgeon: [name], MD;  Location: [facility];  Service: Thoracic;  Laterality: Left;  . RIGHT/LEFT HEART CATH AND CORONARY ANGIOGRAPHY N/A 10/08/2016   Procedure: RIGHT/LEFT HEART CATH AND CORONARY ANGIOGRAPHY;  Surgeon: Burnell Blanks, MD;  Location: [facility];  Service: Cardiovascular;  Laterality: N/A;  . TONSILLECTOMY    . VESICOVAGINAL FISTULA CLOSURE W/ TAH  1971  . VIDEO ASSISTED THORACOSCOPY (VATS)/THOROCOTOMY Left 04/12/2013   Procedure: VIDEO ASSISTED THORACOSCOPY (VATS)/THOROCOTOMY, WITH LEFT  UPPER LOBE WEDGE RESECTION, CHEST WALL BIOPSY ;  Surgeon: [name], MD;  Location: [facility];  Service: Thoracic;  Laterality: Left;  Marland Kitchen VIDEO BRONCHOSCOPY N/A 04/12/2013   Procedure: VIDEO BRONCHOSCOPY;  Surgeon: [name], MD;  Location: [facility] OR;  Service: Thoracic;  Laterality: N/A;     Current Outpatient Medications  Medication Sig Dispense Refill  . aspirin EC 81 MG tablet Take 81 mg by mouth daily.    Marland Kitchen  atorvastatin (LIPITOR) 20 MG tablet Take 1 tablet (20 mg total) by mouth daily. 90 tablet 3  . Calcium-Magnesium-Vitamin D (CALCIUM 1200+D3 PO) Take 1 tablet by mouth daily.     Marland Kitchen denosumab (PROLIA) 60 MG/ML SOLN injection Inject 60 mg into the skin every 6 (six) months. Administer in upper arm, thigh, or abdomen    . leflunomide (ARAVA) 20 MG tablet Take 20 mg by mouth daily.    Marland Kitchen losartan (COZAAR) 100 MG tablet Take 100 mg by mouth daily.     . metoprolol succinate (TOPROL-XL) 50 MG 24 hr tablet Take 1 tablet (50 mg total) by mouth daily. 90 tablet 3  . Omega-3 Fatty Acids (FISH OIL) 600 MG CAPS Take 600 mg by mouth daily. Reported on 06/05/2015    . omeprazole (PRILOSEC) 20 MG capsule Take 20 mg by mouth daily as needed.    . Venlafaxine HCl 75 MG TB24 Take 75 mg by mouth daily.      No current facility-administered medications for this visit.     Allergies:   Latex; Ace inhibitors; Fentanyl; Hydrochlorothiazide; and Penicillins   Social History:  The patient  reports that she quit smoking about 29 years ago. Her smoking use included cigarettes. She has a 35.00 pack-year smoking history. She has never used smokeless tobacco. She reports that she does not drink alcohol or use drugs.   Family History:  The patient's family history includes Arthritis/Rheumatoid in her maternal grandmother and son; Coronary artery disease in her son and son; Heart attack (age of onset: 24) in her son; Lung cancer in her mother.    ROS:  Please see the history of present illness.   Otherwise, review of systems is positive for none.   All other systems are reviewed and negative.    PHYSICAL EXAM: VS:  BP 134/72   Pulse 83   Ht 5\' 2"  (1.575 m)   Wt 179 lb 12.8 oz (81.6 kg)   SpO2 99%   BMI 32.89 kg/m  , BMI Body mass index is 32.89 kg/m. GEN: Well nourished, well developed, in no acute distress  HEENT: normal  Neck: no JVD, carotid bruits, or masses Cardiac: RRR; no murmurs, rubs, or gallops,no edema    Respiratory:  clear to auscultation bilaterally, normal work of breathing GI: soft, nontender, nondistended, + BS MS: no deformity or atrophy  Skin: warm and dry Neuro:  Strength and sensation are intact Psych: euthymic mood, full affect  EKG:  EKG is ordered today. Personal review of the ekg ordered shows, rate 83, septal Q waves, IVCD nonspecific, lateral T wave inversions  Recent Labs: 10/03/2016: ALT 6; Hemoglobin 12.0; Platelets 159; TSH 0.956 01/22/2017: BUN 17; Creatinine, Ser 0.58; Potassium 4.0; Sodium 141    Lipid Panel  No results found for: CHOL, TRIG, HDL, CHOLHDL, VLDL, LDLCALC, LDLDIRECT   Wt Readings from Last 3 Encounters:  04/07/17 179 lb 12.8 oz (81.6 kg)  03/11/17 182 lb (82.6 kg)  02/05/17 182 lb (82.6 kg)  Other studies Reviewed: Additional studies/ records that were reviewed today include: TTE 03/10/17  Review of the above records today demonstrates:  - Left ventricle: The cavity size was moderately dilated. Wall   thickness was increased in a pattern of moderate LVH. Systolic   function was moderately to severely reduced. The estimated   ejection fraction was in the range of 30% to 35%. Diffuse   hypokinesis. Doppler parameters are consistent with both elevated   ventricular end-diastolic filling pressure and elevated left   atrial filling pressure. - Left atrium: The atrium was mildly dilated. - Atrial septum: No defect or patent foramen ovale was identified.  RHC/LHC  Prox LAD to Mid LAD lesion, 10 %stenosed.  LV end diastolic pressure is normal.   1. Mild non-obstructive CAD 2. Non-ischemic cardiomyopathy 3. Normal filling pressures  Recommendations: Medical management of cardiomyopathy   ASSESSMENT AND PLAN:  1.  Nonischemic cardiomyopathy: Currently on optimal medical therapy.  Ejection fraction has continued to be low.  Discussed options for an ICD implant.  Risks and benefits were discussed.  Risks include bleeding, tamponade,  infection, pneumothorax among others.  But anxious that ICD implant and her outlook on overall life.  She would like to further consider an ICD and Delois Silvester call us back with an answer.  2.  Hypertension: Blood pressure well controlled, continue current medications  3.  Hyperlipidemia: Continue statin   Current medicines are reviewed at length with the patient today.   The patient does not have concerns regarding her medicines.  The following changes were made today:  none  Labs/ tests ordered today include:  Orders Placed This Encounter  Procedures  . EKG 12-Lead   Case discussed with thoracic surgery  Disposition:   FU with Kong Packett 3 months  Signed, Randolf Sansoucie Meredith Leeds, MD  04/07/2017 9:12 AM     [name] [address] [place] [address] [phone] (office) [phone] (fax)

## 2017-04-07 NOTE — Progress Notes (Addendum)
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  SURGICAL PROCEDURE: Video bronchoscopy, left video-assisted  thoracoscopy, mini-thoracotomy, wedge resection of left upper lobe with  lymph node dissection, and biopsy of chest wall.  SURGEON: Lanelle Bal, MD  Lung cancer, left upper lobe- Squamous cell    Primary site: Lung (Left)   Staging method: AJCC 7th Edition   Pathologic: Stage IB (T2a, N0, cM0) signed by [name], MD on 04/14/2013  8:31 AM   Summary: Stage IB (T2a, N0, cM0)  History of Present Illness:     Patient returns to the office today for  followup  following  wedge resection of left upper lobe for squamous cell carcinoma stage IB on 04/12/2013.  During her hospital course she developed Klebsiella in her sputum, Escherichia coli in her urine, Pseudomonas at the left chest incision site. She was initially discharged home to SNF .She comes to the office today 24 months  Postop  She continues to do much better with her respiratory status, her main complaints revolve around her rheumatoid arthritis and various side effects from medication. She is convinced that the surgical resection of her lung cancer is markedly improved her respiratory status as she is much less breathless than she was previously. I reminded her that weight loss is probably the biggest factor in her overall improvement in respiratory status. She now goes to exercise class III days a week , No longer uses oxygen at home or inhalers.    CT scan was done  in January 2019  Zubrod Score: At the time of surgery this patient's most appropriate activity status/level should be described as: []     0    Normal activity, no symptoms [x]     1    Restricted in physical strenuous activity but ambulatory, able to do out light work []     2    Ambulatory and capable of self care, unable to do work activities, up and about >50 % of waking hours                              []     3    Only limited self care, in bed greater than 50% of waking hours []     4    Completely disabled, no self care, confined to bed or chair []     5    Moribund    Past Medical History:  Diagnosis Date  . Abnormal CT scan   . Allergic rhinitis   . Anemia 2015   after lung surgery  . Complication of anesthesia    Fentanyl allergy, "claustrophobia"  . COPD (chronic obstructive pulmonary disease) ([facility])   . Depression   . Diaphoresis 07/30/2015   Excessive sweating with little exertion  . Difficult intravenous access   . GERD (gastroesophageal reflux disease)    In the past  . Hyperlipidemia   . Hypertension   .  Left bundle branch block   . Lung cancer ([name]) 2015   left upper lobe wedge removed-no further tx.  . Neuropathy    Tingling in arms, fingers, and feet (Since 06/15/16)  . Obesity   . OSA (obstructive sleep apnea)    denies.  . Osteoarthritis    ra also  . Osteoporosis   . Rheumatoid arthritis(714.0)    Dr. Stark Jock follows  . SOB (shortness of breath) on exertion   . Uterine cancer ([facility]) 1971   tx surgical     Social History   Tobacco Use  Smoking Status Former Smoker  . Packs/day: 1.00  . Years: 35.00  . Pack years: 35.00  . Types: Cigarettes  . Last attempt to quit: 01/07/1988  . Years since quitting: 29.3  Smokeless Tobacco Never Used    Social History   Substance and Sexual Activity  Alcohol Use No   Comment: not since 1988     Allergies  Allergen Reactions  . Latex     REACTION: itching  . Ace Inhibitors     REACTION: cough  .  Fentanyl     Hallucination "I go crazy"   . Hydrochlorothiazide     REACTION: decreased sodium  . Penicillins Other (See Comments)    REACTION: intolerant due to yeast infection Has patient had a PCN reaction causing immediate rash, facial/tongue/throat swelling, SOB or lightheadedness with hypotension:No Has patient had a PCN reaction causing severe rash involving mucus membranes or skin necrosis: No Has patient had a PCN reaction that required hospitalization No Has patient had a PCN reaction occurring within the last 10 years: Yes If all of the above answers are "NO", then may proceed with Cephalosporin use.     Current Outpatient Medications  Medication Sig Dispense Refill  . aspirin EC 81 MG tablet Take 81 mg by mouth daily.    Marland Kitchen atorvastatin (LIPITOR) 20 MG tablet Take 1 tablet (20 mg total) by mouth daily. 90 tablet 3  . Calcium-Magnesium-Vitamin D (CALCIUM 1200+D3 PO) Take 1 tablet by mouth daily.     Marland Kitchen denosumab (PROLIA) 60 MG/ML SOLN injection Inject 60 mg into the skin every 6 (six) months. Administer in upper arm, thigh, or abdomen    . leflunomide (ARAVA) 20 MG tablet Take 20 mg by mouth daily.    Marland Kitchen losartan (COZAAR) 100 MG tablet Take 100 mg by mouth daily.     . metoprolol succinate (TOPROL-XL) 50 MG 24 hr tablet Take 1 tablet (50 mg total) by mouth daily. 90 tablet 3  . Omega-3 Fatty Acids (FISH OIL) 600 MG CAPS Take 600 mg by mouth daily. Reported on 06/05/2015    . omeprazole (PRILOSEC) 20 MG capsule Take 20 mg by mouth daily as needed.    . Venlafaxine HCl 75 MG TB24 Take 75 mg by mouth daily.      No current facility-administered medications for this visit.        Physical Exam: BP (!) 148/88 (BP Location: Right Arm, Patient Position: Sitting, Cuff Size: Large)   Pulse 80   Resp 18   Ht 5\' 2"  (1.575 m)   Wt 179 lb (81.2 kg)   SpO2 95% Comment: RA  BMI 32.74 kg/m   General appearance: alert, cooperative and appears older than stated age Neurologic:  intact Heart: regular rate and rhythm, S1, S2 normal, no murmur, click, rub or gallop Lungs: diminished breath sounds bibasilar Abdomen: soft, non-tender; bowel sounds normal; no masses,  no organomegaly Extremities: edema  Mild ankle edema Wound: Her left chest port sites and incision are well-healed without evidence of drainage erythema or infection On palpation of the left chest wall there is no crepitance noted  Diagnostic Studies & Laboratory data:     Recent Radiology Findings: CLINICAL DATA:  Solitary pulmonary nodule.  EXAM: CT CHEST WITH CONTRAST  TECHNIQUE: Multidetector CT imaging of the chest was performed during intravenous contrast administration.  CONTRAST:  72mL ISOVUE-300 IOPAMIDOL (ISOVUE-300) INJECTION 61%  COMPARISON:  04/17/2016.  FINDINGS: Cardiovascular: The heart size is normal. No pericardial effusion. Coronary artery calcification is evident. Atherosclerotic calcification is noted in the wall of the thoracic aorta. Pulmonary scratches that prominence of the main pulmonary arteries (3.0 cm diameter on the right and 3.2 cm diameter on the left) suggests pulmonary arterial hypertension.  Mediastinum/Nodes: No mediastinal lymphadenopathy. There is no hilar lymphadenopathy. The esophagus has normal imaging features. There is no axillary lymphadenopathy.  Lungs/Pleura: Biapical pleural-parenchymal scarring again noted. 3 mm left apical nodule (image 23 series 3) is unchanged. 3 mm perifissural nodule in the left lung seen on image 52 today is stable. Surgical scarring in the posterior left upper lobe is stable. Chronic atelectasis or scarring in the right middle lobe is unchanged in the interval.  No focal airspace consolidation. No pulmonary edema or pleural effusion. Circumferential bronchial wall thickening with changes of emphysema again noted.  Upper Abdomen: Small hypoattenuating lesion in the dome of the left liver is stable and  compatible with a cyst. 1.4 x 1.6 cm left adrenal nodule was largely obscured by motion artifact on the previous study and less well demonstrated on earlier studies due to the thicker slice collimation used on those prior exams. This is unchanged in the interval and also comparing back to 04/13/2014. Imaging features most consistent with benign adrenal adenoma. Diverticular changes are noted in the abdominal segments of the colon. Possible tiny calcified gallstones.  Musculoskeletal: Bone windows reveal no worrisome lytic or sclerotic osseous lesions.  IMPRESSION: 1. Stable exam.  No new or progressive findings. 2. Stable appearance of surgical scarring posterior left upper lobe. 3. Prominence of the main pulmonary arteries raise the question of pulmonary arterial hypertension. 4.  Aortic Atherosclerois (ICD10-170.0) 5.  Emphysema. (BTD17-O16.9)   Electronically Signed   By: Misty Stanley M.D.   On: 01/22/2017 14:19   Ct Chest Wo Contrast  04/26/2015  CLINICAL DATA:  Lung cancer with left upper lobe resection. Uterine cancer with hysterectomy. Ex-smoker. Follow-up lung nodule. EXAM: CT CHEST WITHOUT CONTRAST TECHNIQUE: Multidetector CT imaging of the chest was performed following the standard protocol without IV contrast. COMPARISON:  11/02/2014 FINDINGS: Mediastinum/Nodes: Aortic and branch vessel atherosclerosis. Mild cardiomegaly. Multivessel coronary artery atherosclerosis. Pulmonary artery enlargement, with the main pulmonary arteries measuring 2.9 and 3.1 cm respectively. 7 mm precarinal node is unchanged and not pathologic by size criteria. Hilar regions poorly evaluated without intravenous contrast. Lungs/Pleura: No pleural fluid. Lower lobe predominant bronchial wall thickening. Mild centrilobular emphysema. 2 mm right apical pulmonary nodule is unchanged on image 19/series 4. Right middle lobe volume loss and mild bronchiectasis. Similar. Bibasilar bronchial wall thickening  is moderate and likely post infectious or inflammatory. Posterior left upper lobe wedge resection. Similar appearance of for trace residual loculated pleural air. Example image 52/series 4. Nodularity along the surgical sutures is resolved. Similar minimal thickening of the left major fissure image 41/series 4. Upper abdomen: Left hepatic lobe subcentimeter cyst. Probable gallstones. Normal imaged portions of the spleen, stomach, pancreas, adrenal glands, kidneys.  Musculoskeletal: Moderate thoracic spondylosis IMPRESSION: 1. Status post left upper lobe wedge resection, without recurrent or metastatic disease. 2. Decrease in nodularity along the surgical sutures. Persistent loculated trace left pleural air, postoperative. 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 3.  Atherosclerosis, including within the coronary arteries. 4. Pulmonary artery enlargement suggests pulmonary arterial hypertension. Electronically Signed   By: Abigail Miyamoto M.D.   On: 04/26/2015 14:14   Ct Chest Wo Contrast  11/02/2014  CLINICAL DATA:  Lung cancer status post left upper lobe wedge resection in 04/2013. History of uterine cancer status post hysterectomy in 1971. EXAM: CT CHEST WITHOUT CONTRAST TECHNIQUE: Multidetector CT imaging of the chest was performed following the standard protocol without IV contrast. COMPARISON:  04/13/2014 FINDINGS: Mediastinum/Nodes: The heart is normal in size. No pericardial effusion. Coronary atherosclerosis. Atherosclerotic calcifications of the aortic arch. Small mediastinal lymph nodes measuring up to 7 mm short axis (series 3/ image 25), unchanged. Visualized left thyroid is mildly enlarged/heterogeneous. Lungs/Pleura: Postsurgical changes related to posterior left upper lobe wedge resection (series 4/image 28). Minimal nodularity along the left fissure (series 4/ image 22), postsurgical. 2-3 mm subpleural nodule in the lateral right lung apex (series 4/image 11, unchanged since 2015, benign. No  suspicious pulmonary nodules. Mild scarring with bronchiectasis in the right middle lobe (series 4/image 40). Mild bronchial wall thickening with subpleural reticulation/ fibrosis in the bilateral lower lobes. No focal consolidation. Underlying mild paraseptal emphysematous changes. No pleural effusion or pneumothorax. Upper abdomen: Visualized upper abdomen is notable for a 10 mm left adrenal adenoma. Musculoskeletal: Degenerative changes of the visualized thoracolumbar spine. IMPRESSION: Postsurgical changes related to left upper lobe wedge resection. No evidence of recurrent or metastatic disease in the chest. Electronically Signed   By: Julian Hy M.D.   On: 11/02/2014 15:14   Ct Chest Wo Contrast  04/13/2014   CLINICAL DATA:  77 year old female with left upper lobe lung cancer status post resection 6 months ago. Tenderness at the surgical site.  EXAM: CT CHEST WITHOUT CONTRAST  TECHNIQUE: Multidetector CT imaging of the chest was performed following the standard protocol without IV contrast.  COMPARISON:  Chest CT 10/06/2013.  FINDINGS: Mediastinum/Lymph Nodes: Heart size is normal. There is no significant pericardial fluid, thickening or pericardial calcification. There is atherosclerosis of the thoracic aorta, the great vessels of the mediastinum and the coronary arteries, including calcified atherosclerotic plaque in the left main, left anterior descending and right coronary arteries. No pathologically enlarged mediastinal or hilar lymph nodes. Please note that accurate exclusion of hilar adenopathy is limited on noncontrast CT scans. Esophagus is unremarkable in appearance. No axillary lymphadenopathy.  Lungs/Pleura: Postoperative changes of wedge resection are again noted in the lateral aspect of the left upper lobe. Again noted is a large peripheral postoperative pneumatocele in this region, which has decreased significantly in size compared to the prior study. A few scattered 2-3 mm micronodules  are noted throughout the lungs bilaterally, peribronchovascular in distribution, favored to represent areas of chronic mucoid impaction within terminal bronchioles, similar to prior studies. No larger more suspicious appearing pulmonary nodules or masses are noted. Mild diffuse bronchial wall thickening, with some scattered areas of cylindrical and mild varicose bronchiectasis, most evident in the lower lobes of the lungs bilaterally. No acute consolidative airspace disease. No pleural effusions. There continues to be a small amount of herniation have the left lower lobe through the fifth intercostal space laterally, at the site of thoracotomy. Trace amount of residual gas in the left serratus musculature, significantly decreased  compared to the prior examination.  Upper Abdomen: Atherosclerosis.  Musculoskeletal/Soft Tissues: Postthoracotomy changes in the left ribs. There are no aggressive appearing lytic or blastic lesions noted in the visualized portions of the skeleton.  IMPRESSION: 1. Status post left upper lobe wedge resection, with no findings to suggest local recurrence of disease. Decreasing size of postoperative the mass steel in the left upper lobe, and decreasing herniation through the left fifth intercostal space, as discussed above. 2. Diffuse bronchial wall thickening with areas of cylindrical and mild varicose bronchiectasis, most evident in the lower lobes of the lungs bilaterally. These findings are similar to recent prior examinations. 3. Atherosclerosis, including left main and 2 vessel coronary artery disease. Assessment for potential risk factor modification, dietary therapy or pharmacologic therapy may be warranted, if clinically indicated. 4. Additional incidental findings, as above.   Electronically Signed   By: Vinnie Langton M.D.   On: 04/13/2014 13:24    Recent Lab Findings: Lab Results  Component Value Date   WBC 4.2 10/03/2016   HGB 12.0 10/03/2016   HCT 35.5 10/03/2016    PLT 159 10/03/2016   GLUCOSE 109 (H) 01/22/2017   ALT 6 10/03/2016   AST 25 10/03/2016   NA 141 01/22/2017   K 4.0 01/22/2017   CL 105 01/22/2017   CREATININE 0.58 01/22/2017   BUN 17 01/22/2017   CO2 30 01/22/2017   TSH 0.956 10/03/2016   INR 1.0 10/03/2016   Wt Readings from Last 3 Encounters:  04/07/17 179 lb (81.2 kg)  04/07/17 179 lb 12.8 oz (81.6 kg)  03/11/17 182 lb (82.6 kg)     Assessment / Plan:    Patient stable after resection of non-small cell carcinoma in 2015.  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    [name] MD      [address] [place] [address] Office [phone]   Beeper [phone]  04/22/2017 9:53 PM  Plan ct in Jan 2020/ 8-9 months

## 2017-04-07 NOTE — Patient Instructions (Signed)
Medication Instructions:  Your physician recommends that you continue on your current medications as directed. Please refer to the Current Medication list given to you today.  Labwork: None ordered  Testing/Procedures: None ordered  Follow-Up: To be determined.  Please call the office if/when you decide you would like to have a defibrillator implanted.  * If you need a refill on your cardiac medications before your next appointment, please call your pharmacy.   *Please note that any paperwork needing to be filled out by the provider will need to be addressed at the front desk prior to seeing the provider. Please note that any FMLA, disability or other documents regarding health condition is subject to a $25.00 charge that must be received prior to completion of paperwork in the form of a money order or check.  Thank you for choosing [facility]!!   Trinidad Curet, RN [phone]  Any Other Special Instructions Will Be Listed Below (If Applicable).  Information on defibrillators give to you today.

## 2017-04-08 ENCOUNTER — Telehealth: Payer: Self-pay | Admitting: Cardiology

## 2017-04-08 NOTE — Telephone Encounter (Signed)
Returned the call to the patient. She recently had an appointment with Dr. Curt Bears to discuss possibly getting an ICD. She stated that she has many concerns and questions that she would like to discuss with Dr. Stanford Breed only. An appointment has been made for 4/24 with Dr. Stanford Breed.

## 2017-04-08 NOTE — Telephone Encounter (Signed)
New Message    Patient would like to speak with Dr Stanford Breed in depth about having the device installed

## 2017-04-13 DIAGNOSIS — E7800 Pure hypercholesterolemia, unspecified: Secondary | ICD-10-CM | POA: Diagnosis not present

## 2017-04-13 DIAGNOSIS — Z85118 Personal history of other malignant neoplasm of bronchus and lung: Secondary | ICD-10-CM | POA: Diagnosis not present

## 2017-04-13 DIAGNOSIS — M810 Age-related osteoporosis without current pathological fracture: Secondary | ICD-10-CM | POA: Diagnosis not present

## 2017-04-13 DIAGNOSIS — M069 Rheumatoid arthritis, unspecified: Secondary | ICD-10-CM | POA: Diagnosis not present

## 2017-04-13 DIAGNOSIS — F39 Unspecified mood [affective] disorder: Secondary | ICD-10-CM | POA: Diagnosis not present

## 2017-04-13 DIAGNOSIS — Z1389 Encounter for screening for other disorder: Secondary | ICD-10-CM | POA: Diagnosis not present

## 2017-04-13 DIAGNOSIS — Z0000 Encounter for general adult medical examination without abnormal findings: Secondary | ICD-10-CM | POA: Diagnosis not present

## 2017-04-13 DIAGNOSIS — G4733 Obstructive sleep apnea (adult) (pediatric): Secondary | ICD-10-CM | POA: Diagnosis not present

## 2017-04-13 DIAGNOSIS — C3492 Malignant neoplasm of unspecified part of left bronchus or lung: Secondary | ICD-10-CM | POA: Diagnosis not present

## 2017-04-13 DIAGNOSIS — I10 Essential (primary) hypertension: Secondary | ICD-10-CM | POA: Diagnosis not present

## 2017-04-13 DIAGNOSIS — I2721 Secondary pulmonary arterial hypertension: Secondary | ICD-10-CM | POA: Diagnosis not present

## 2017-04-13 DIAGNOSIS — K219 Gastro-esophageal reflux disease without esophagitis: Secondary | ICD-10-CM | POA: Diagnosis not present

## 2017-04-15 NOTE — Progress Notes (Signed)
HPI: FU CM. Pt withH/O dyspnea; also with h/o lung ca, COPD and OSA. Echocardiogram in December of 2012 showed an ejection fraction of 10-62%, grade 1 diastolic dysfunction and mild left atrial enlargement. Chest CT 4/18 showed no recurrence of lung ca, LM and 2 vessel atherosclerosis. TSH 07/29/2016 1.269. Echocardiogram September 2018 showed ejection fraction 35-40%, mild left ventricular hypertrophy. I reviewed this and felt ejection fraction likely 30%.Cardiac catheterization October 2018 showed mild nonobstructive coronary disease and normal filling pressures.  Echocardiogram repeated February 2019 and showed ejection fraction 30-35% and mild left atrial enlargement.  Seen by Dr Curt Bears and is considering ICD. Since last seenshe has mild dyspnea on exertion but no orthopnea, PND, chest pain or pedal edema.  Current Outpatient Medications  Medication Sig Dispense Refill  . aspirin EC 81 MG tablet Take 81 mg by mouth daily.    Marland Kitchen atorvastatin (LIPITOR) 20 MG tablet Take 1 tablet (20 mg total) by mouth daily. 90 tablet 3  . Calcium-Magnesium-Vitamin D (CALCIUM 1200+D3 PO) Take 1 tablet by mouth daily.     . Cholecalciferol (VITAMIN D3) 2000 units TABS Take 2,000 Units by mouth daily.    Marland Kitchen denosumab (PROLIA) 60 MG/ML SOLN injection Inject 60 mg into the skin every 6 (six) months. Administer in upper arm, thigh, or abdomen    . leflunomide (ARAVA) 20 MG tablet Take 20 mg by mouth daily.    . metoprolol succinate (TOPROL-XL) 50 MG 24 hr tablet Take 1 tablet (50 mg total) by mouth daily. 90 tablet 3  . Omega-3 Fatty Acids (FISH OIL) 600 MG CAPS Take 600 mg by mouth daily. Reported on 06/05/2015    . omeprazole (PRILOSEC) 20 MG capsule Take 20 mg by mouth daily as needed.    . Venlafaxine HCl 75 MG TB24 Take 75 mg by mouth daily.      No current facility-administered medications for this visit.      Past Medical History:  Diagnosis Date  . Abnormal CT scan   . Allergic rhinitis   .  Anemia 2015   after lung surgery  . Complication of anesthesia    Fentanyl allergy, "claustrophobia"  . COPD (chronic obstructive pulmonary disease) ([place])   . Depression   . Diaphoresis 07/30/2015   Excessive sweating with little exertion  . Difficult intravenous access   . GERD (gastroesophageal reflux disease)    In the past  . Hyperlipidemia   . Hypertension   . Left bundle branch block   . Lung cancer ([place]) 2015   left upper lobe wedge removed-no further tx.  . Neuropathy    Tingling in arms, fingers, and feet (Since 06/15/16)  . Obesity   . OSA (obstructive sleep apnea)    denies.  . Osteoarthritis    ra also  . Osteoporosis   . Rheumatoid arthritis(714.0)    Dr. Stark Jock follows  . SOB (shortness of breath) on exertion   . Uterine cancer ([place]) 1971   tx surgical    Past Surgical History:  Procedure Laterality Date  . ABDOMINAL HYSTERECTOMY     in situ carcinoma partial  . CARDIAC CATHETERIZATION  01/27/11   minor non-obs CAD, NL EF, mild pulm HTN  . CATARACT EXTRACTION, BILATERAL     last done 12'16-  . COLONOSCOPY W/ BIOPSIES AND POLYPECTOMY    . COLONOSCOPY WITH PROPOFOL N/A 02/13/2015   Procedure: COLONOSCOPY WITH PROPOFOL;  Surgeon: Laurence Spates, MD;  Location: [facility];  Service: Endoscopy;  Laterality:  N/A;  . ESOPHAGOGASTRODUODENOSCOPY (EGD) WITH PROPOFOL N/A 09/30/2016   Procedure: ESOPHAGOGASTRODUODENOSCOPY (EGD) WITH PROPOFOL;  Surgeon: Laurence Spates, MD;  Location: [facility];  Service: Endoscopy;  Laterality: N/A;  . LYMPH NODE DISSECTION Left 04/12/2013   Procedure: LYMPH NODE DISSECTION;  Surgeon: [name], MD;  Location: [place];  Service: Thoracic;  Laterality: Left;  . RIGHT/LEFT HEART CATH AND CORONARY ANGIOGRAPHY N/A 10/08/2016   Procedure: RIGHT/LEFT HEART CATH AND CORONARY ANGIOGRAPHY;  Surgeon: Burnell Blanks, MD;  Location: [facility];  Service: Cardiovascular;  Laterality: N/A;  . TONSILLECTOMY    . VESICOVAGINAL  FISTULA CLOSURE W/ TAH  1971  . VIDEO ASSISTED THORACOSCOPY (VATS)/THOROCOTOMY Left 04/12/2013   Procedure: VIDEO ASSISTED THORACOSCOPY (VATS)/THOROCOTOMY, WITH LEFT UPPER LOBE WEDGE RESECTION, CHEST WALL BIOPSY ;  Surgeon: [name], MD;  Location: [facility];  Service: Thoracic;  Laterality: Left;  Marland Kitchen VIDEO BRONCHOSCOPY N/A 04/12/2013   Procedure: VIDEO BRONCHOSCOPY;  Surgeon: [name], MD;  Location: [facility];  Service: Thoracic;  Laterality: N/A;    Social History   Socioeconomic History  . Marital status: Widowed    Spouse name: Not on file  . Number of children: 2  . Years of education: Not on file  . Highest education level: Not on file  Occupational History  . Occupation: Nurse, adult: [employer]  Social Needs  . Financial resource strain: Not on file  . Food insecurity:    Worry: Not on file    Inability: Not on file  . Transportation needs:    Medical: Not on file    Non-medical: Not on file  Tobacco Use  . Smoking status: Former Smoker    Packs/day: 1.00    Years: 35.00    Pack years: 35.00    Types: Cigarettes    Last attempt to quit: 01/07/1988    Years since quitting: 29.3  . Smokeless tobacco: Never Used  Substance and Sexual Activity  . Alcohol use: No    Comment: not since 1988  . Drug use: No  . Sexual activity: Never  Lifestyle  . Physical activity:    Days per week: Not on file    Minutes per session: Not on file  . Stress: Not on file  Relationships  . Social connections:    Talks on phone: Not on file    Gets together: Not on file    Attends religious service: Not on file    Active member of club or organization: Not on file    Attends meetings of clubs or organizations: Not on file    Relationship status: Not on file  . Intimate partner violence:    Fear of current or ex partner: Not on file    Emotionally abused: Not on file    Physically abused: Not on file    Forced sexual activity: Not on file  Other Topics Concern  . Not on  file  Social History Narrative  . Not on file    Family History  Problem Relation Age of Onset  . Lung cancer Mother   . Coronary artery disease Son   . Heart attack Son 77  . Arthritis/Rheumatoid Maternal Grandmother   . Coronary artery disease Son   . Arthritis/Rheumatoid Son     ROS: no fevers or chills, productive cough, hemoptysis, dysphasia, odynophagia, melena, hematochezia, dysuria, hematuria, rash, seizure activity, orthopnea, PND, pedal edema, claudication. Remaining systems are negative.  Physical Exam: Well-developed well-nourished in no  acute distress.  Skin is warm and dry.  HEENT is normal.  Neck is supple.  Chest is clear to auscultation with normal expansion.  Cardiovascular exam is regular rate and rhythm.  Abdominal exam nontender or distended. No masses palpated. Extremities show no edema. neuro grossly intact   A/P  1 nonischemic cardiomyopathy-continue beta-blocker.  Her losartan was discontinued by primary care because of recall.  Add Entresto 24/26 twice daily.  Check potassium and renal function in 1 week.  We discussed the indications for ICD.  She declines at this point and understands the high risk of sudden death.  2 hypertension-blood pressure is borderline.  However her ARB was discontinued.  Will add Entresto as outlined.  Follow blood pressure and adjust regimen as needed.  3 hyperlipidemia-continue statin.  4 dyspnea-this is felt to be multifactorial including history of lung cancer, prior lung resection, COPD, obstructive sleep apnea and obesity hypoventilation syndrome.  Filling pressures at time of previous catheterization normal.  Kirk Ruths, MD

## 2017-04-17 ENCOUNTER — Ambulatory Visit: Payer: PPO | Admitting: Emergency Medicine

## 2017-04-29 ENCOUNTER — Encounter: Payer: Self-pay | Admitting: Cardiology

## 2017-04-29 ENCOUNTER — Ambulatory Visit: Payer: PPO | Admitting: Cardiology

## 2017-04-29 VITALS — BP 140/76 | HR 63 | Ht 62.0 in | Wt 180.0 lb

## 2017-04-29 DIAGNOSIS — E7800 Pure hypercholesterolemia, unspecified: Secondary | ICD-10-CM | POA: Diagnosis not present

## 2017-04-29 DIAGNOSIS — I10 Essential (primary) hypertension: Secondary | ICD-10-CM

## 2017-04-29 DIAGNOSIS — R0600 Dyspnea, unspecified: Secondary | ICD-10-CM

## 2017-04-29 DIAGNOSIS — I428 Other cardiomyopathies: Secondary | ICD-10-CM

## 2017-04-29 MED ORDER — SACUBITRIL-VALSARTAN 24-26 MG PO TABS: 1.0000 | ORAL_TABLET | Freq: Two times a day (BID) | ORAL | Status: DC

## 2017-04-29 MED ORDER — SACUBITRIL-VALSARTAN 24-26 MG PO TABS: 1.0000 | ORAL_TABLET | Freq: Two times a day (BID) | ORAL | 3 refills | Status: DC

## 2017-04-29 NOTE — Patient Instructions (Signed)
Medication Instructions:   START ENTRESTO 24/26 MG ONE TABLET TWICE DAILY  Labwork:  Your physician recommends that you return for lab work in: [place]:  Your physician recommends that you schedule a follow-up appointment in: [place]   If you need a refill on your cardiac medications before your next appointment, please call your pharmacy.

## 2017-04-30 ENCOUNTER — Telehealth: Payer: Self-pay | Admitting: Cardiology

## 2017-04-30 NOTE — Telephone Encounter (Signed)
Returned call to patient, patient states she does not want to take Entresto.  She states she took valsartan last year and had a bad allergic reaction (excessive sweating).   States she did not realize Entresto had this medication in it.   She has read the insert and now decided she should not take this medication.  She states she already has other issues going on and does not want to experience another side effect.   She took [employer] yesterday but has not taken this today.    Advised would route to Dr. Stanford Breed to review and advise.

## 2017-04-30 NOTE — Telephone Encounter (Signed)
Would be unusual for entresto to cause excessive sweating and would be good for her heart if agreeable to at least try Kirk Ruths

## 2017-04-30 NOTE — Telephone Encounter (Signed)
New Message   Pt c/o medication issue:  1. Name of Medication: sacubitril-valsartan (ENTRESTO) 24-26 mg per tablet  2. How are you currently taking this medication (dosage and times per day)?   3. Are you having a reaction (difficulty breathing--STAT)? yes  4. What is your medication issue? Pt states she is having an allergic reaction and would like to speak with a nurse. Please call

## 2017-05-01 MED ORDER — CANDESARTAN CILEXETIL 4 MG PO TABS: 4.0000 mg | ORAL_TABLET | Freq: Every day | ORAL | 12 refills | Status: DC

## 2017-05-01 NOTE — Telephone Encounter (Signed)
Candesartan 4mg  daily will be a good option for this patient due to history HF.  Otherwise I will follow up DR Crenshaw's recommendation of hydralazine TID.

## 2017-05-01 NOTE — Telephone Encounter (Signed)
Discussed with dr Stanford Breed, we would like for her to go back on losartan. The patient was taken off losartan by her PCP because of the recall. I explained to the patient if the lot of the medicine is not affected we are still using the losartan. Per dr Stanford Breed her other option is hydralazine and nitrates. The patient would like me to check with the [facility] md to see what is available for her to take in the place of losartan that she can try. Will forward to pharm md to advise per dr Stanford Breed.

## 2017-05-01 NOTE — Telephone Encounter (Signed)
Spoke with pt, she will not take entresto again. She would be willing to go back on losartan if needed. Will discuss with dr Stanford Breed.

## 2017-05-01 NOTE — Telephone Encounter (Signed)
Spoke with pt, New script sent to the pharmacy and entresto script canceled.

## 2017-05-04 ENCOUNTER — Telehealth: Payer: Self-pay | Admitting: Cardiology

## 2017-05-04 NOTE — Telephone Encounter (Signed)
New Message   Pt c/o medication issue:  1. Name of Medication: Entresto  2. How are you currently taking this medication (dosage and times per day)?   3. Are you having a reaction (difficulty breathing--STAT)?   4. What is your medication issue? Patient states that she no longer takes Entresto. So does she need to come into the office for labs still.

## 2017-05-04 NOTE — Telephone Encounter (Signed)
Left message to call back  

## 2017-05-05 ENCOUNTER — Telehealth: Payer: Self-pay | Admitting: Cardiology

## 2017-05-05 NOTE — Telephone Encounter (Signed)
New Message:       Pt states she does not plan to go to the lab since she is no longer on Entresto. If she needs to go pls call pt.

## 2017-05-05 NOTE — Telephone Encounter (Signed)
Spoke with pt, she does not require labs at this time.

## 2017-05-06 DIAGNOSIS — M0589 Other rheumatoid arthritis with rheumatoid factor of multiple sites: Secondary | ICD-10-CM | POA: Diagnosis not present

## 2017-06-09 ENCOUNTER — Ambulatory Visit (INDEPENDENT_AMBULATORY_CARE_PROVIDER_SITE_OTHER): Payer: PPO | Admitting: Allergy and Immunology

## 2017-06-09 ENCOUNTER — Encounter: Payer: Self-pay | Admitting: Allergy and Immunology

## 2017-06-09 VITALS — BP 118/72 | HR 62 | Temp 98.8°F | Resp 18 | Ht 62.0 in | Wt 175.0 lb

## 2017-06-09 DIAGNOSIS — T7840XA Allergy, unspecified, initial encounter: Secondary | ICD-10-CM

## 2017-06-09 DIAGNOSIS — T783XXA Angioneurotic edema, initial encounter: Secondary | ICD-10-CM | POA: Diagnosis not present

## 2017-06-09 DIAGNOSIS — J449 Chronic obstructive pulmonary disease, unspecified: Secondary | ICD-10-CM | POA: Diagnosis not present

## 2017-06-09 MED ORDER — LORATADINE 10 MG PO TABS: 10.0000 mg | ORAL_TABLET | Freq: Two times a day (BID) | ORAL | 5 refills | Status: DC

## 2017-06-09 MED ORDER — MONTELUKAST SODIUM 10 MG PO TABS: 10.0000 mg | ORAL_TABLET | Freq: Every day | ORAL | 5 refills | Status: DC

## 2017-06-09 NOTE — Progress Notes (Signed)
Dear Dr. Laurann Montana,  Thank you for referring Taylor Berry to the [facility] [place] on 06/09/2017.   Below is a summation of this patient's evaluation and recommendations.  Thank you for your referral. I will keep you informed about this patient's response to treatment.   If you have any questions please do not hesitate to contact me.   Sincerely,  Jiles Prows, MD Allergy / Immunology [facility]   ______________________________________________________________________    NEW PATIENT NOTE  Referring Provider: Kelton Pillar, MD Primary Provider: Kelton Pillar, MD Date of office visit: 06/09/2017    Subjective:   Chief Complaint:  Taylor Berry (DOB: 03/24/1940) is a 77 y.o. female who presents to the clinic on 06/09/2017 with a chief complaint of Oral Swelling .     HPI: Taylor Berry presents to this clinic in evaluation of lip swelling.  Apparently for the past 2 months she has been having recurrent episodes of lower lip swelling and occasionally upper lip swelling occurring 1-2 times per day with a waxing and waning pattern sometimes associated with some difficulty swallowing although she does not relate a history of tongue swelling or difficulty swallowing.  This appears to occur mostly in the morning but can occur anytime throughout the day.  There are no associated systemic or constitutional symptoms with this issue.  There is no obvious provoking factor giving rise to this issue.  She believes that this is secondary to medications but there is no specific medication that can be identified giving rise to this issue.  She has had a problem with ACE inhibitors in the past but this was an issue with coughing and she does not use an ACE inhibitor at this point.  The only other issue that tends to bother her that she thinks is relatively new is that she has diarrhea on almost a daily basis for  the past year.  She has apparently had a gastroenterologist to evaluate her for this issue and everything has turned out okay.  As well, over the course of the past 24 hours she has developed a head cold with clear nasal discharge and nasal congestion and a slight cough without any fever or ugly nasal discharge or chest pain or sputum production or difficulty breathing.  She does have a history of COPD secondary to extensive smoking although this does not appear to require any specific therapy.  Past Medical History:  Diagnosis Date  . Abnormal CT scan   . Allergic rhinitis   . Anemia 2015   after lung surgery  . Angio-edema   . Complication of anesthesia    Fentanyl allergy, "claustrophobia"  . COPD (chronic obstructive pulmonary disease) ([place])   . Depression   . Diaphoresis 07/30/2015   Excessive sweating with little exertion  . Difficult intravenous access   . GERD (gastroesophageal reflux disease)    In the past  . Hyperlipidemia   . Hypertension   . Left bundle branch block   . Lung cancer ([place]) 2015   left upper lobe wedge removed-no further tx.  . Neuropathy    Tingling in arms, fingers, and feet (Since 06/15/16)  . Obesity   . OSA (obstructive sleep apnea)    denies.  . Osteoarthritis    ra also  . Osteoporosis   . Recurrent upper respiratory infection (URI)   . Rheumatoid arthritis(714.0)    Dr. Stark Jock follows  . SOB (  shortness of breath) on exertion   . Uterine cancer ([place]) 1971   tx surgical    Past Surgical History:  Procedure Laterality Date  . ABDOMINAL HYSTERECTOMY     in situ carcinoma partial  . CARDIAC CATHETERIZATION  01/27/11   minor non-obs CAD, NL EF, mild pulm HTN  . CATARACT EXTRACTION, BILATERAL     last done 12'16-  . COLONOSCOPY W/ BIOPSIES AND POLYPECTOMY    . COLONOSCOPY WITH PROPOFOL N/A 02/13/2015   Procedure: COLONOSCOPY WITH PROPOFOL;  Surgeon: Laurence Spates, MD;  Location: [facility];  Service: Endoscopy;  Laterality: N/A;  .  ESOPHAGOGASTRODUODENOSCOPY (EGD) WITH PROPOFOL N/A 09/30/2016   Procedure: ESOPHAGOGASTRODUODENOSCOPY (EGD) WITH PROPOFOL;  Surgeon: Laurence Spates, MD;  Location: [facility];  Service: Endoscopy;  Laterality: N/A;  . LYMPH NODE DISSECTION Left 04/12/2013   Procedure: LYMPH NODE DISSECTION;  Surgeon: [name], MD;  Location: [facility];  Service: Thoracic;  Laterality: Left;  . RIGHT/LEFT HEART CATH AND CORONARY ANGIOGRAPHY N/A 10/08/2016   Procedure: RIGHT/LEFT HEART CATH AND CORONARY ANGIOGRAPHY;  Surgeon: Burnell Blanks, MD;  Location: [facility];  Service: Cardiovascular;  Laterality: N/A;  . TONSILLECTOMY    . VESICOVAGINAL FISTULA CLOSURE W/ TAH  1971  . VIDEO ASSISTED THORACOSCOPY (VATS)/THOROCOTOMY Left 04/12/2013   Procedure: VIDEO ASSISTED THORACOSCOPY (VATS)/THOROCOTOMY, WITH LEFT UPPER LOBE WEDGE RESECTION, CHEST WALL BIOPSY ;  Surgeon: [name], MD;  Location: [facility];  Service: Thoracic;  Laterality: Left;  Marland Kitchen VIDEO BRONCHOSCOPY N/A 04/12/2013   Procedure: VIDEO BRONCHOSCOPY;  Surgeon: [name], MD;  Location: [facility];  Service: Thoracic;  Laterality: N/A;    Allergies as of 06/09/2017      Reactions   Latex    REACTION: itching   Ace Inhibitors    REACTION: cough   Fentanyl    Hallucination "I go crazy"    Hydrochlorothiazide    REACTION: decreased sodium   Penicillins Other (See Comments)   REACTION: intolerant due to yeast infection Has patient had a PCN reaction causing immediate rash, facial/tongue/throat swelling, SOB or lightheadedness with hypotension:No Has patient had a PCN reaction causing severe rash involving mucus membranes or skin necrosis: No Has patient had a PCN reaction that required hospitalization No Has patient had a PCN reaction occurring within the last 10 years: Yes If all of the above answers are "NO", then may proceed with Cephalosporin use.      Medication List           aspirin EC 81 MG tablet Take 81 mg by  mouth daily.   atorvastatin 20 MG tablet Commonly known as:  LIPITOR Take 1 tablet (20 mg total) by mouth daily.   BIOTIN PO Take by mouth.   CALCIUM 1200+D3 PO Take 1 tablet by mouth daily.   candesartan 4 MG tablet Commonly known as:  ATACAND Take 1 tablet (4 mg total) by mouth daily.   denosumab 60 MG/ML Soln injection Commonly known as:  PROLIA Inject 60 mg into the skin every 6 (six) months. Administer in upper arm, thigh, or abdomen   Fish Oil 600 MG Caps Take 600 mg by mouth daily. Reported on 06/05/2015   leflunomide 20 MG tablet Commonly known as:  ARAVA Take 20 mg by mouth daily.   loratadine 10 MG tablet Commonly known as:  CLARITIN Take 1 tablet (10 mg total) by mouth 2 (two) times daily.   metoprolol succinate 50 MG 24 hr tablet Commonly known as:  TOPROL XL Take 1 tablet (50 mg total) by mouth daily.   montelukast 10 MG tablet Commonly known as:  SINGULAIR Take 1 tablet (10 mg total) by mouth at bedtime.   omeprazole 20 MG capsule Commonly known as:  PRILOSEC Take 20 mg by mouth daily as needed.   PROBIOTIC DAILY PO Take by mouth.   Venlafaxine HCl 75 MG Tb24 Take 75 mg by mouth daily.   Vitamin D3 2000 units Tabs Take 2,000 Units by mouth daily.       Review of systems negative except as noted in HPI / PMHx or noted below:  Review of Systems  Constitutional: Negative.   HENT: Negative.   Eyes: Negative.   Respiratory: Negative.   Cardiovascular: Negative.   Gastrointestinal: Negative.   Genitourinary: Negative.   Musculoskeletal: Negative.   Skin: Negative.   Neurological: Negative.   Endo/Heme/Allergies: Negative.   Psychiatric/Behavioral: Negative.     Family History  Problem Relation Age of Onset  . Lung cancer Mother   . Coronary artery disease Son   . Heart attack Son 73  . Arthritis/Rheumatoid Maternal Grandmother   . Coronary artery disease Son   . Arthritis/Rheumatoid Son     Social History   Socioeconomic  History  . Marital status: Widowed    Spouse name: Not on file  . Number of children: 2  . Years of education: Not on file  . Highest education level: Not on file  Occupational History  . Occupation: Nurse, adult: [employer]  Social Needs  . Financial resource strain: Not on file  . Food insecurity:    Worry: Not on file    Inability: Not on file  . Transportation needs:    Medical: Not on file    Non-medical: Not on file  Tobacco Use  . Smoking status: Former Smoker    Packs/day: 1.00    Years: 35.00    Pack years: 35.00    Types: Cigarettes    Last attempt to quit: 01/07/1988    Years since quitting: 29.4  . Smokeless tobacco: Never Used  Substance and Sexual Activity  . Alcohol use: No    Comment: not since 1988  . Drug use: No  . Sexual activity: Never  Lifestyle  . Physical activity:    Days per week: Not on file    Minutes per session: Not on file  . Stress: Not on file  Relationships  . Social connections:    Talks on phone: Not on file    Gets together: Not on file    Attends religious service: Not on file    Active member of club or organization: Not on file    Attends meetings of clubs or organizations: Not on file    Relationship status: Not on file  . Intimate partner violence:    Fear of current or ex partner: Not on file    Emotionally abused: Not on file    Physically abused: Not on file    Forced sexual activity: Not on file  Other Topics Concern  . Not on file  Social History Narrative  . Not on file    Environmental and Social history  Lives in a condominium and with a dry environment, a cat located inside the household, hardwood in the bedroom, plastic on the bed, plastic on the pillow, and no smokers located inside the household.  Objective:   Vitals:   06/09/17 0909  BP: 118/72  Pulse: 62  Resp:  18  Temp: 98.8 F (37.1 C)  SpO2: 96%   Height: 5\' 2"  (157.5 cm) Weight: 175 lb (79.4 kg)  Physical Exam  HENT:  Head:  Normocephalic. Head is without right periorbital erythema and without left periorbital erythema.  Right Ear: Tympanic membrane, external ear and ear canal normal.  Left Ear: Tympanic membrane, external ear and ear canal normal.  Nose: Nose normal. No mucosal edema or rhinorrhea.  Mouth/Throat: Uvula is midline, oropharynx is clear and moist and mucous membranes are normal. No oropharyngeal exudate.  Eyes: Pupils are equal, round, and reactive to light. Conjunctivae and lids are normal.  Neck: Trachea normal. No tracheal tenderness present. No tracheal deviation present. No thyromegaly present.  Cardiovascular: Normal rate, regular rhythm, S1 normal and S2 normal.  Murmur (Systolic) heard. Pulmonary/Chest: Effort normal and breath sounds normal. No stridor. No respiratory distress. She has no wheezes (Expiratory wheezes left anterior and posterior lung field). She has no rales. She exhibits no tenderness.  Abdominal: Soft. She exhibits no distension and no mass. There is no hepatosplenomegaly. There is no tenderness. There is no rebound and no guarding.  Musculoskeletal: She exhibits no edema or tenderness.  Lymphadenopathy:       Head (right side): No tonsillar adenopathy present.       Head (left side): No tonsillar adenopathy present.    She has no cervical adenopathy.    She has no axillary adenopathy.  Neurological: She is alert.  Skin: No rash noted. She is not diaphoretic. No erythema. No pallor. Nails show no clubbing.    Diagnostics: Allergy skin tests were performed.  She did not demonstrate any hypersensitivity against a screening panel of foods.  Spirometry was performed and demonstrated an FEV1 of 0.75 @ 40 % of predicted. FEV1/FVC = 0.6  Results of a PFT with lung volumes obtained 05 August 2017 identified TLC 91%, RV 123%, DL/VA 105%.  Results of a chest CT scan obtained 22 January 2017 identified the following:  Mediastinum/Nodes: No mediastinal lymphadenopathy. There is no  hilar lymphadenopathy. The esophagus has normal imaging features. There is no axillary lymphadenopathy.  Lungs/Pleura: Biapical pleural-parenchymal scarring again noted. 3 mm left apical nodule (image 23 series 3) is unchanged. 3 mm perifissural nodule in the left lung seen on image 52 today is stable. Surgical scarring in the posterior left upper lobe is stable. Chronic atelectasis or scarring in the right middle lobe is unchanged in the interval.  No focal airspace consolidation. No pulmonary edema or pleural effusion. Circumferential bronchial wall thickening with changes of emphysema again noted.  Assessment and Plan:    1. Angioedema, initial encounter   2. Allergic reaction, initial encounter   3. Chronic obstructive pulmonary disease, unspecified COPD type ([place])     1. Avoidance Measures - eliminate all supplements   2. Preventative treatment:   A. Loratadine 10mg  - one tablet two times per day  B. Ranitidine 150mg  - one tablet two times per day  C. Montelukast 10mg  - one tablet one time per day  3. Medication discontinuation?  4. Blood - C4, C1Q, C1EI level and function, alpha-gal  5. For "cold"   A. Nasal saline  B. OTC Mucinex DM  6. Return in 4 weeks or earlier if problem  7. Review blood test results from [place] may be having recurrent episodes of angioedema affecting her lip and it is not quite obvious what is giving rise to this potential immunological hyperreactivity.  We will obtain a few blood  test looking for a significant immune abnormality.  As well, I have asked her to discontinue all over-the-counter supplements and we may need to have her systematically eliminate or replace some of her prescription medicines in the future if we cannot get this condition under good control.  As well, we need to consider the possibility of granulomatous cheilitis giving rise to her swollen lower lip.  I will review all the blood test performed by her  rheumatologist over the course of the past several months.  She will utilize a plan of therapy hopefully to decrease immunological hyperreactivity.  She also appears to have a viral respiratory tract infection that hopefully will not result in significant airway issues as she moves forward.  I have asked her to contact me should she develop significant problems with shortness of breath or chest tightness or extensive coughing or wheezing with this event. I will regroup with her in 4 weeks or earlier if there is a problem.  Jiles Prows, MD Allergy / Immunology [facility] [place]

## 2017-06-09 NOTE — Patient Instructions (Addendum)
  1. Avoidance Measures - eliminate all supplements   2. Preventative treatment:   A. Loratadine 10mg  - one tablet two times per day  B. Ranitidine 150mg  - one tablet two times per day  C. Montelukast 10mg  - one tablet one time per day  3. Medication discontinuation?  4. Blood - C4, C1Q, C1EI level and function, alpha-gal  5. For "cold"   A. Nasal saline  B. OTC Mucinex DM  6. Return in 4 weeks or earlier if problem  7. Review blood test results from Rheumatologist

## 2017-06-10 ENCOUNTER — Telehealth: Payer: Self-pay | Admitting: Allergy and Immunology

## 2017-06-10 ENCOUNTER — Encounter: Payer: Self-pay | Admitting: Allergy and Immunology

## 2017-06-10 MED ORDER — RANITIDINE HCL 150 MG PO CAPS: 150.0000 mg | ORAL_CAPSULE | Freq: Two times a day (BID) | ORAL | 5 refills | Status: DC

## 2017-06-10 NOTE — Telephone Encounter (Signed)
Patient was seen yesterday, 06-09-17, and she said on her AVS, it shows 3 medications were to be called in. She was able to get 2 of them, but she states the acid reflux medication was not sent in and she wants to know if he changed his mind on this. She would like a call back. [facility] [address].

## 2017-06-10 NOTE — Telephone Encounter (Signed)
I left a detailed message advising patient of rx being sent.

## 2017-06-11 DIAGNOSIS — H903 Sensorineural hearing loss, bilateral: Secondary | ICD-10-CM | POA: Diagnosis not present

## 2017-06-16 LAB — ALPHA-GAL PANEL
Alpha Gal IgE*: <0.1 kU/L (ref <0.10)
Beef (Bos spp) IgE: <0.1 kU/L (ref <0.35)
Class Interpretation: 0
Class Interpretation: 0
Class Interpretation: 0
Lamb/Mutton (Ovis spp) IgE: <0.1 kU/L (ref <0.35)
Pork (Sus spp) IgE: <0.1 kU/L (ref <0.35)

## 2017-06-16 LAB — C1 ESTERASE INHIBITOR: C1INH SerPl-mCnc: 36 mg/dL (ref 21–39)

## 2017-06-16 LAB — C4 COMPLEMENT: Complement C4, Serum: 24 mg/dL (ref 14–44)

## 2017-06-16 LAB — COMPLEMENT COMPONENT C1Q: Complement C1Q: 15.8 mg/dL (ref 11.8–24.4)

## 2017-06-16 LAB — C1 ESTERASE INHIBITOR, FUNCTIONAL: C1INH Functional/C1INH Total MFr SerPl: 88 %mean normal

## 2017-06-23 DIAGNOSIS — E669 Obesity, unspecified: Secondary | ICD-10-CM | POA: Diagnosis not present

## 2017-06-23 DIAGNOSIS — R0602 Shortness of breath: Secondary | ICD-10-CM | POA: Diagnosis not present

## 2017-06-23 DIAGNOSIS — Z6830 Body mass index (BMI) 30.0-30.9, adult: Secondary | ICD-10-CM | POA: Diagnosis not present

## 2017-06-23 DIAGNOSIS — Z79899 Other long term (current) drug therapy: Secondary | ICD-10-CM | POA: Diagnosis not present

## 2017-06-23 DIAGNOSIS — M150 Primary generalized (osteo)arthritis: Secondary | ICD-10-CM | POA: Diagnosis not present

## 2017-06-23 DIAGNOSIS — R29898 Other symptoms and signs involving the musculoskeletal system: Secondary | ICD-10-CM | POA: Diagnosis not present

## 2017-06-23 DIAGNOSIS — M0589 Other rheumatoid arthritis with rheumatoid factor of multiple sites: Secondary | ICD-10-CM | POA: Diagnosis not present

## 2017-06-23 DIAGNOSIS — R682 Dry mouth, unspecified: Secondary | ICD-10-CM | POA: Diagnosis not present

## 2017-06-23 DIAGNOSIS — C3490 Malignant neoplasm of unspecified part of unspecified bronchus or lung: Secondary | ICD-10-CM | POA: Diagnosis not present

## 2017-06-23 DIAGNOSIS — R232 Flushing: Secondary | ICD-10-CM | POA: Diagnosis not present

## 2017-06-23 DIAGNOSIS — M810 Age-related osteoporosis without current pathological fracture: Secondary | ICD-10-CM | POA: Diagnosis not present

## 2017-07-03 ENCOUNTER — Encounter: Payer: Self-pay | Admitting: Internal Medicine

## 2017-07-07 ENCOUNTER — Ambulatory Visit (INDEPENDENT_AMBULATORY_CARE_PROVIDER_SITE_OTHER): Payer: PPO | Admitting: Allergy and Immunology

## 2017-07-07 ENCOUNTER — Encounter: Payer: Self-pay | Admitting: Allergy and Immunology

## 2017-07-07 VITALS — BP 118/72 | HR 66 | Resp 18

## 2017-07-07 DIAGNOSIS — T783XXD Angioneurotic edema, subsequent encounter: Secondary | ICD-10-CM

## 2017-07-07 DIAGNOSIS — M3500 Sicca syndrome, unspecified: Secondary | ICD-10-CM | POA: Diagnosis not present

## 2017-07-07 DIAGNOSIS — J449 Chronic obstructive pulmonary disease, unspecified: Secondary | ICD-10-CM | POA: Diagnosis not present

## 2017-07-07 DIAGNOSIS — J4489 Other specified chronic obstructive pulmonary disease: Secondary | ICD-10-CM

## 2017-07-07 DIAGNOSIS — R197 Diarrhea, unspecified: Secondary | ICD-10-CM | POA: Diagnosis not present

## 2017-07-07 MED ORDER — BUDESONIDE-FORMOTEROL FUMARATE 160-4.5 MCG/ACT IN AERO: 2.0000 | INHALATION_SPRAY | Freq: Two times a day (BID) | RESPIRATORY_TRACT | 5 refills | Status: DC

## 2017-07-07 MED ORDER — COLESTIPOL HCL 1 G PO TABS: ORAL_TABLET | ORAL | 5 refills | Status: DC

## 2017-07-07 NOTE — Patient Instructions (Addendum)
  1. Continue off all supplements  2.  Continue ranitidine 150 mg twice a day  3.  Can use colestipol 1 g tablet 1-2 times a day for diarrhea  4.  Start Symbicort 160 - 2 inhalations twice a day with spacer  5.  Can use pro-air HFA 2 inhalations every 4-6 hours.  6.  Can use OTC Biotene for dry mouth  7.  Return to clinic in 12 weeks or earlier if problem  8.  Contact clinic in 2 weeks regarding response to colestipol and Symbicort

## 2017-07-07 NOTE — Progress Notes (Signed)
Follow-up Note  Referring Provider: Kelton Pillar, MD Primary Provider: Kelton Pillar, MD Date of Office Visit: 07/07/2017  Subjective:   Taylor Berry (DOB: 1940/02/07) is a 77 y.o. female who returns to the [facility] [place] on 07/07/2017 in re-evaluation of the following:  HPI: [name] returns to this clinic in reevaluation of her recurrent episodes of lip swelling and dry mouth and intermittent diarrhea and cough.  I last saw her in this clinic on 09 June 2017 at which point in time we attempted to address her oral issue.  When she presented to this clinic last she did have an issue with a cold that was giving rise to cough.  She has a long history of COPD followed by Dr. Lamonte Sakai.  She has tried various inhalers in the past which have not really helped her very much.  She continues to have cough just about every morning at this point.  She has had much less lip swelling.  She still continues to have very dry mouth.  She does have rheumatoid arthritis but she does not know if she is ever been labeled as having sicca syndrome.  She still continues to have diarrhea on almost a daily basis.  She has continued to use ranitidine as prescribed but she had to stop loratadine and montelukast because she thought that this gave rise to coughing.  Allergies as of 07/07/2017      Reactions   Latex    REACTION: itching   Ace Inhibitors    REACTION: cough   Fentanyl    Hallucination "I go crazy"    Hydrochlorothiazide    REACTION: decreased sodium   Penicillins Other (See Comments)   REACTION: intolerant due to yeast infection Has patient had a PCN reaction causing immediate rash, facial/tongue/throat swelling, SOB or lightheadedness with hypotension:No Has patient had a PCN reaction causing severe rash involving mucus membranes or skin necrosis: No Has patient had a PCN reaction that required hospitalization No Has patient had a PCN reaction occurring within the last 10 years:  Yes If all of the above answers are "NO", then may proceed with Cephalosporin use.      Medication List      atorvastatin 20 MG tablet Commonly known as:  LIPITOR Take 1 tablet (20 mg total) by mouth daily.   BIOTIN PO Take by mouth.   candesartan 4 MG tablet Commonly known as:  ATACAND Take 1 tablet (4 mg total) by mouth daily.   denosumab 60 MG/ML Soln injection Commonly known as:  PROLIA Inject 60 mg into the skin every 6 (six) months. Administer in upper arm, thigh, or abdomen   leflunomide 20 MG tablet Commonly known as:  ARAVA Take 20 mg by mouth daily.   loratadine 10 MG tablet Commonly known as:  CLARITIN Take 1 tablet (10 mg total) by mouth 2 (two) times daily.   metoprolol succinate 50 MG 24 hr tablet Commonly known as:  TOPROL XL Take 1 tablet (50 mg total) by mouth daily.   ranitidine 150 MG capsule Commonly known as:  ZANTAC Take 1 capsule (150 mg total) by mouth 2 (two) times daily.   Venlafaxine HCl 75 MG Tb24 Take 75 mg by mouth daily.       Past Medical History:  Diagnosis Date  . Abnormal CT scan   . Allergic rhinitis   . Anemia 2015   after lung surgery  . Angio-edema   . Complication of anesthesia  Fentanyl allergy, "claustrophobia"  . COPD (chronic obstructive pulmonary disease) ([place])   . Depression   . Diaphoresis 07/30/2015   Excessive sweating with little exertion  . Difficult intravenous access   . GERD (gastroesophageal reflux disease)    In the past  . Hyperlipidemia   . Hypertension   . Left bundle branch block   . Lung cancer ([place]) 2015   left upper lobe wedge removed-no further tx.  . Neuropathy    Tingling in arms, fingers, and feet (Since 06/15/16)  . Obesity   . OSA (obstructive sleep apnea)    denies.  . Osteoarthritis    ra also  . Osteoporosis   . Recurrent upper respiratory infection (URI)   . Rheumatoid arthritis(714.0)    Dr. Stark Jock follows  . SOB (shortness of breath) on exertion   . Uterine cancer  ([place]) 1971   tx surgical    Past Surgical History:  Procedure Laterality Date  . ABDOMINAL HYSTERECTOMY     in situ carcinoma partial  . CARDIAC CATHETERIZATION  01/27/11   minor non-obs CAD, NL EF, mild pulm HTN  . CATARACT EXTRACTION, BILATERAL     last done 12'16-  . COLONOSCOPY W/ BIOPSIES AND POLYPECTOMY    . COLONOSCOPY WITH PROPOFOL N/A 02/13/2015   Procedure: COLONOSCOPY WITH PROPOFOL;  Surgeon: Laurence Spates, MD;  Location: [facility];  Service: Endoscopy;  Laterality: N/A;  . ESOPHAGOGASTRODUODENOSCOPY (EGD) WITH PROPOFOL N/A 09/30/2016   Procedure: ESOPHAGOGASTRODUODENOSCOPY (EGD) WITH PROPOFOL;  Surgeon: Laurence Spates, MD;  [facility];  Service: Endoscopy;  Laterality: N/A;  . LYMPH NODE DISSECTION Left 04/12/2013   Procedure: LYMPH NODE DISSECTION;  Surgeon: [name], MD;  Location: [facility];  Service: Thoracic;  Laterality: Left;  . RIGHT/LEFT HEART CATH AND CORONARY ANGIOGRAPHY N/A 10/08/2016   Procedure: RIGHT/LEFT HEART CATH AND CORONARY ANGIOGRAPHY;  Surgeon: Burnell Blanks, MD;  Location: [facility];  Service: Cardiovascular;  Laterality: N/A;  . TONSILLECTOMY    . VESICOVAGINAL FISTULA CLOSURE W/ TAH  1971  . VIDEO ASSISTED THORACOSCOPY (VATS)/THOROCOTOMY Left 04/12/2013   Procedure: VIDEO ASSISTED THORACOSCOPY (VATS)/THOROCOTOMY, WITH LEFT UPPER LOBE WEDGE RESECTION, CHEST WALL BIOPSY ;  Surgeon: [name], MD;  Location: [facility];  Service: Thoracic;  Laterality: Left;  Marland Kitchen VIDEO BRONCHOSCOPY N/A 04/12/2013   Procedure: VIDEO BRONCHOSCOPY;  Surgeon: [name], MD;  Location: [facility];  Service: Thoracic;  Laterality: N/A;    Review of systems negative except as noted in HPI / PMHx or noted below:  Review of Systems  Constitutional: Negative.   HENT: Negative.   Eyes: Negative.   Respiratory: Negative.   Cardiovascular: Negative.   Gastrointestinal: Negative.   Genitourinary: Negative.   Musculoskeletal: Negative.   Skin:  Negative.   Neurological: Negative.   Endo/Heme/Allergies: Negative.   Psychiatric/Behavioral: Negative.      Objective:   Vitals:   07/07/17 1002  BP: 118/72  Pulse: 66  Resp: 18  SpO2: 95%          Physical Exam  HENT:  Head: Normocephalic.  Right Ear: Tympanic membrane, external ear and ear canal normal.  Left Ear: Tympanic membrane, external ear and ear canal normal.  Nose: Nose normal. No mucosal edema or rhinorrhea.  Mouth/Throat: Uvula is midline and oropharynx is clear and moist. Mucous membranes are dry. No oropharyngeal exudate.  Eyes: Conjunctivae are normal.  Neck: Trachea normal. No tracheal tenderness present. No tracheal deviation present. No thyromegaly present.  Cardiovascular: Normal  rate, regular rhythm, S1 normal, S2 normal and normal heart sounds.  No murmur heard. Pulmonary/Chest: No stridor. No respiratory distress. She has wheezes (Bilateral expiratory wheezing all lung fields). She has no rales.  Musculoskeletal: She exhibits no edema.  Lymphadenopathy:       Head (right side): No tonsillar adenopathy present.       Head (left side): No tonsillar adenopathy present.    She has no cervical adenopathy.  Neurological: She is alert.  Skin: No rash noted. She is not diaphoretic. No erythema. Nails show no clubbing.    Diagnostics:    Spirometry was performed and demonstrated an FEV1 of 0.68 at 35 % of predicted.    Results of blood tests obtained for June 2019 identified negative alpha gal panel, C4 24 mg/DL, C1 inhibitor level 36 mg/DL, C1 inhibitor function 88%.  Assessment and Plan:   1. COPD with asthma ([place])   2. Diarrhea, unspecified type   3. Angioedema, subsequent encounter   4. Sicca syndrome ([place])     1. Continue off all supplements  2.  Continue ranitidine 150 mg twice a day  3.  Can use colestipol 1 g tablet 1-2 times a day for diarrhea  4.  Start Symbicort 160 - 2 inhalations twice a day with spacer  5.  Can use pro-air  HFA 2 inhalations every 4-6 hours.  6.  Can use OTC Biotene for dry mouth  7.  Return to clinic in 12 weeks or earlier if problem  8.  Contact clinic in 2 weeks regarding response to colestipol and Symbicort  Shaletta is better regarding some of her issues and we will continue to have her utilize the plan noted above.  In addition, she has diarrhea and we will start her on colestipol and she has COPD and we will start her on Symbicort.  She also appears to have chronic heart failure in the context of chronic atrial fibrillation and presently has a low heart rate.  It has been discussed with her cardiologist that she undergo placement of a pacemaker/defibrillator but to date she has not moved forward with that recommendation and she is going to obtain a second opinion regarding that procedure on the eighth of this month with another cardiologist.  I will see her back in this clinic in 12 weeks or earlier if there is a problem.  I have asked her to contact us in 2 weeks regarding her response to colestipol and Symbicort.  Allena Katz, MD Allergy / Immunology [place]

## 2017-07-08 ENCOUNTER — Encounter: Payer: Self-pay | Admitting: Allergy and Immunology

## 2017-07-13 ENCOUNTER — Encounter: Payer: Self-pay | Admitting: Internal Medicine

## 2017-07-13 ENCOUNTER — Ambulatory Visit: Payer: PPO | Admitting: Internal Medicine

## 2017-07-13 VITALS — BP 124/70 | HR 64 | Ht 62.0 in | Wt 176.0 lb

## 2017-07-13 DIAGNOSIS — D049 Carcinoma in situ of skin, unspecified: Secondary | ICD-10-CM | POA: Insufficient documentation

## 2017-07-13 DIAGNOSIS — I428 Other cardiomyopathies: Secondary | ICD-10-CM

## 2017-07-13 DIAGNOSIS — M069 Rheumatoid arthritis, unspecified: Secondary | ICD-10-CM | POA: Insufficient documentation

## 2017-07-13 DIAGNOSIS — M810 Age-related osteoporosis without current pathological fracture: Secondary | ICD-10-CM | POA: Insufficient documentation

## 2017-07-13 DIAGNOSIS — K219 Gastro-esophageal reflux disease without esophagitis: Secondary | ICD-10-CM | POA: Insufficient documentation

## 2017-07-13 DIAGNOSIS — F39 Unspecified mood [affective] disorder: Secondary | ICD-10-CM | POA: Insufficient documentation

## 2017-07-13 DIAGNOSIS — I519 Heart disease, unspecified: Secondary | ICD-10-CM

## 2017-07-13 DIAGNOSIS — G4733 Obstructive sleep apnea (adult) (pediatric): Secondary | ICD-10-CM | POA: Insufficient documentation

## 2017-07-13 DIAGNOSIS — J42 Unspecified chronic bronchitis: Secondary | ICD-10-CM | POA: Insufficient documentation

## 2017-07-13 DIAGNOSIS — IMO0002 Reserved for concepts with insufficient information to code with codable children: Secondary | ICD-10-CM | POA: Insufficient documentation

## 2017-07-13 DIAGNOSIS — F1021 Alcohol dependence, in remission: Secondary | ICD-10-CM | POA: Insufficient documentation

## 2017-07-13 DIAGNOSIS — C399 Malignant neoplasm of lower respiratory tract, part unspecified: Secondary | ICD-10-CM | POA: Insufficient documentation

## 2017-07-13 DIAGNOSIS — Z86010 Personal history of colonic polyps: Secondary | ICD-10-CM | POA: Insufficient documentation

## 2017-07-13 NOTE — Patient Instructions (Addendum)
Medication Instructions:  Your physician recommends that you continue on your current medications as directed. Please refer to the Current Medication list given to you today.  Labwork: None ordered.  Testing/Procedures: None ordered.  Follow-Up: Your physician wants you to follow-up in: as needed with Dr. Allred.       Any Other Special Instructions Will Be Listed Below (If Applicable).  If you need a refill on your cardiac medications before your next appointment, please call your pharmacy.   

## 2017-07-13 NOTE — Progress Notes (Signed)
Electrophysiology Office Note   Date:  07/13/2017   ID:  Waverly, Tarquinio [birth year], MRN [id number]  PCP:  Kelton Pillar, MD  Cardiologist:  Dr Stanford Breed Primary Electrophysiologist: Dr Curt Bears   CC: CHF   History of Present Illness: Taylor Berry is a 77 y.o. female who presents today for electrophysiology evaluation.   She has a nonischemic CM (EF 30%), LBBB, and NYHA Class II CHF.  She has been evaluated by Dr Curt Bears (his note 4/19 reviewed).  ICD was offered and she has so far declined.  She presents today for further EP discussions regarding possible device implantation. She is reasonably active.  She has SOB with moderate activity. Today, she denies symptoms of palpitations, chest pain, orthopnea, PND, lower extremity edema, claudication, dizziness, presyncope, syncope, bleeding, or neurologic sequela. The patient is tolerating medications without difficulties and is otherwise without complaint today.    Past Medical History:  Diagnosis Date  . Abnormal CT scan   . Allergic rhinitis   . Anemia 2015   after lung surgery  . Angio-edema   . Complication of anesthesia    Fentanyl allergy, "claustrophobia"  . COPD (chronic obstructive pulmonary disease) ([place])   . Depression   . Diaphoresis 07/30/2015   Excessive sweating with little exertion  . Difficult intravenous access   . GERD (gastroesophageal reflux disease)    In the past  . Hyperlipidemia   . Hypertension   . Left bundle branch block   . Lung cancer ([place]) 2015   left upper lobe wedge removed-no further tx.  . Neuropathy    Tingling in arms, fingers, and feet (Since 06/15/16)  . Obesity   . OSA (obstructive sleep apnea)    denies.  . Osteoarthritis    ra also  . Osteoporosis   . Recurrent upper respiratory infection (URI)   . Rheumatoid arthritis(714.0)    Dr. Stark Jock follows  . SOB (shortness of breath) on exertion   . Uterine cancer ([place]) 1971   tx surgical   Past Surgical History:    Procedure Laterality Date  . ABDOMINAL HYSTERECTOMY     in situ carcinoma partial  . CARDIAC CATHETERIZATION  01/27/11   minor non-obs CAD, NL EF, mild pulm HTN  . CATARACT EXTRACTION, BILATERAL     last done 12'16-  . COLONOSCOPY W/ BIOPSIES AND POLYPECTOMY    . COLONOSCOPY WITH PROPOFOL N/A 02/13/2015   Procedure: COLONOSCOPY WITH PROPOFOL;  Surgeon: Laurence Spates, MD;  [facility];  Service: Endoscopy;  Laterality: N/A;  . ESOPHAGOGASTRODUODENOSCOPY (EGD) WITH PROPOFOL N/A 09/30/2016   Procedure: ESOPHAGOGASTRODUODENOSCOPY (EGD) WITH PROPOFOL;  Surgeon: Laurence Spates, MD;  [facility];  Service: Endoscopy;  Laterality: N/A;  . LYMPH NODE DISSECTION Left 04/12/2013   Procedure: LYMPH NODE DISSECTION;  Surgeon: [name], MD;  Location: [facility];  Service: Thoracic;  Laterality: Left;  . RIGHT/LEFT HEART CATH AND CORONARY ANGIOGRAPHY N/A 10/08/2016   Procedure: RIGHT/LEFT HEART CATH AND CORONARY ANGIOGRAPHY;  Surgeon: Burnell Blanks, MD;  Location: [facility];  Service: Cardiovascular;  Laterality: N/A;  . TONSILLECTOMY    . VESICOVAGINAL FISTULA CLOSURE W/ TAH  1971  . VIDEO ASSISTED THORACOSCOPY (VATS)/THOROCOTOMY Left 04/12/2013   Procedure: VIDEO ASSISTED THORACOSCOPY (VATS)/THOROCOTOMY, WITH LEFT UPPER LOBE WEDGE RESECTION, CHEST WALL BIOPSY ;  Surgeon: [name], MD;  Location: [facility];  Service: Thoracic;  Laterality: Left;  Marland Kitchen VIDEO BRONCHOSCOPY N/A 04/12/2013   Procedure: VIDEO BRONCHOSCOPY;  Surgeon: Lilia Argue  Servando Snare, MD;  Location: [facility];  Service: Thoracic;  Laterality: N/A;     Current Outpatient Medications  Medication Sig Dispense Refill  . atorvastatin (LIPITOR) 20 MG tablet Take 1 tablet (20 mg total) by mouth daily. 90 tablet 3  . BIOTIN PO Take by mouth.    . budesonide-formoterol (SYMBICORT) 160-4.5 MCG/ACT inhaler Inhale 2 puffs into the lungs 2 (two) times daily. 1 Inhaler 5  . candesartan (ATACAND) 4 MG tablet Take 1  tablet (4 mg total) by mouth daily. 30 tablet 12  . colestipol (COLESTID) 1 g tablet 1 tablet by mouth 1-2 times daily as needed for diarrhea 60 tablet 5  . denosumab (PROLIA) 60 MG/ML SOLN injection Inject 60 mg into the skin every 6 (six) months. Administer in upper arm, thigh, or abdomen    . leflunomide (ARAVA) 20 MG tablet Take 20 mg by mouth daily.    . metoprolol succinate (TOPROL-XL) 50 MG 24 hr tablet Take 1 tablet (50 mg total) by mouth daily. 90 tablet 3  . ranitidine (ZANTAC) 150 MG capsule Take 1 capsule (150 mg total) by mouth 2 (two) times daily. 60 capsule 5  . Venlafaxine HCl 75 MG TB24 Take 75 mg by mouth daily.      No current facility-administered medications for this visit.     Allergies:   Latex; Ace inhibitors; Fentanyl; Hydrochlorothiazide; and Penicillins   Social History:  The patient  reports that she quit smoking about 29 years ago. Her smoking use included cigarettes. She has a 35.00 pack-year smoking history. She has never used smokeless tobacco. She reports that she does not drink alcohol or use drugs.   Family History:  The patient's  family history includes Arthritis/Rheumatoid in her maternal grandmother and son; Coronary artery disease in her son and son; Heart attack (age of onset: 80) in her son; Lung cancer in her mother.    ROS:  Please see the history of present illness.   All other systems are personally reviewed and negative.    PHYSICAL EXAM: VS:  BP 124/70   Pulse 64   Ht 5\' 2"  (1.575 m)   Wt 176 lb (79.8 kg)   BMI 32.19 kg/m  , BMI Body mass index is 32.19 kg/m. GEN: Well nourished, well developed, in no acute distress  HEENT: normal  Neck: no JVD, carotid bruits, or masses Cardiac: RRR; no murmurs, rubs, or gallops,no edema  Respiratory:  clear to auscultation bilaterally, normal work of breathing GI: soft, nontender, nondistended, + BS MS: no deformity or atrophy  Skin: warm and dry  Neuro:  Strength and sensation are intact Psych:  euthymic mood, full affect  EKG:  EKG is ordered today. The ekg ordered today is personally reviewed and shows sinus rhythm with incomplete LBBB (QRS 140 msec)   Recent Labs: 10/03/2016: ALT 6; Hemoglobin 12.0; Platelets 159; TSH 0.956 01/22/2017: BUN 17; Creatinine, Ser 0.58; Potassium 4.0; Sodium 141  personally reviewed   Lipid Panel  No results found for: CHOL, TRIG, HDL, CHOLHDL, VLDL, LDLCALC, LDLDIRECT personally reviewed   Wt Readings from Last 3 Encounters:  07/13/17 176 lb (79.8 kg)  06/09/17 175 lb (79.4 kg)  04/29/17 180 lb (81.6 kg)      Other studies personally reviewed: Additional studies/ records that were reviewed today include: recent echo, Drs Curt Bears and Crenshaw's notes  Review of the above records today demonstrates: as above   ASSESSMENT AND PLAN:  1.  Nonischemic CM/ chronic systolic dysfunction, NYHA Class II  CHF, chronic lung disease and lung CA, incomplete LBBB with QRS < 150 msec. She presents today for second opinion regarding an ICD.  She has previously declined and is not very interested in implantation.  She is worried about the impact of device implantation on her ability to plan violin.  I have had a thorough discussion with the patient reviewing options.  The patient has had opportunities to ask questions and have them answered. The patient and I have decided together through a shared decision making process to not proceed with ICD at this time.  ICD implantation does not seem to be something that she has interests in.  We discussed risks of sudden death.  We also discussed that nonischemic CM patients appear to benefit less from ICD when compared to those with ischemic disease.  We also discussed that gien QRS < 150 msec, she is unlikely to benefit from CRT.  She wishes to continue medical therapy going forward.  I think that this approach for her is best.  Follow-up:  Return as needed  Current medicines are reviewed at length with the patient  today.   The patient does not have concerns regarding her medicines.  The following changes were made today:  none  Labs/ tests ordered today include:  Orders Placed This Encounter  Procedures  . EKG 12-Lead     Signed, Thompson Grayer, MD  07/13/2017 2:54 PM     [place] [address] [phone] (office) [phone] (fax)

## 2017-07-14 ENCOUNTER — Ambulatory Visit: Payer: PPO | Admitting: Allergy and Immunology

## 2017-07-15 ENCOUNTER — Encounter: Payer: Self-pay | Admitting: Allergy and Immunology

## 2017-07-22 DIAGNOSIS — H903 Sensorineural hearing loss, bilateral: Secondary | ICD-10-CM | POA: Diagnosis not present

## 2017-08-03 ENCOUNTER — Ambulatory Visit: Payer: PPO | Admitting: Cardiology

## 2017-08-12 NOTE — Progress Notes (Signed)
HPI: FU CM. Pt withH/O dyspnea; also with h/o lung ca, COPD and OSA. Echocardiogram in December of 2012 showed an ejection fraction of 73-41%, grade 1 diastolic dysfunction and mild left atrial enlargement. Chest CT 4/18 showed no recurrence of lung ca, LM and 2 vessel atherosclerosis. TSH 07/29/2016 1.269. Echocardiogram September 2018 showed ejection fraction 35-40%, mild left ventricular hypertrophy. I reviewed this and felt ejection fraction likely 30%.Cardiac catheterization October 2018 showed mild nonobstructive coronary disease and normal filling pressures.Echocardiogram repeated March 2019 and showed ejection fraction 30-35% and mild left atrial enlargement. Declined ICD previously. Since last seenshe has chronic dyspnea on exertion but no orthopnea, PND, pedal edema, chest pain or syncope.  Current Outpatient Medications  Medication Sig Dispense Refill  . atorvastatin (LIPITOR) 20 MG tablet Take 1 tablet (20 mg total) by mouth daily. 90 tablet 3  . BIOTIN PO Take by mouth.    . budesonide-formoterol (SYMBICORT) 160-4.5 MCG/ACT inhaler Inhale 2 puffs into the lungs 2 (two) times daily. 1 Inhaler 5  . candesartan (ATACAND) 4 MG tablet Take 1 tablet (4 mg total) by mouth daily. 30 tablet 12  . colestipol (COLESTID) 1 g tablet 1 tablet by mouth 1-2 times daily as needed for diarrhea 60 tablet 5  . denosumab (PROLIA) 60 MG/ML SOLN injection Inject 60 mg into the skin every 6 (six) months. Administer in upper arm, thigh, or abdomen    . leflunomide (ARAVA) 20 MG tablet Take 20 mg by mouth daily.    . metoprolol succinate (TOPROL-XL) 50 MG 24 hr tablet Take 1 tablet (50 mg total) by mouth daily. 90 tablet 3  . ranitidine (ZANTAC) 150 MG capsule Take 1 capsule (150 mg total) by mouth 2 (two) times daily. 60 capsule 5  . Venlafaxine HCl 75 MG TB24 Take 75 mg by mouth daily.      No current facility-administered medications for this visit.      Past Medical History:  Diagnosis  Date  . Abnormal CT scan   . Allergic rhinitis   . Anemia 2015   after lung surgery  . Angio-edema   . Complication of anesthesia    Fentanyl allergy, "claustrophobia"  . COPD (chronic obstructive pulmonary disease) ([place])   . Depression   . Diaphoresis 07/30/2015   Excessive sweating with little exertion  . Difficult intravenous access   . GERD (gastroesophageal reflux disease)    In the past  . Hyperlipidemia   . Hypertension   . Left bundle branch block   . Lung cancer ([place]) 2015   left upper lobe wedge removed-no further tx.  . Neuropathy    Tingling in arms, fingers, and feet (Since 06/15/16)  . Obesity   . OSA (obstructive sleep apnea)    denies.  . Osteoarthritis    ra also  . Osteoporosis   . Recurrent upper respiratory infection (URI)   . Rheumatoid arthritis(714.0)    Dr. Stark Jock follows  . SOB (shortness of breath) on exertion   . Uterine cancer ([place]) 1971   tx surgical    Past Surgical History:  Procedure Laterality Date  . ABDOMINAL HYSTERECTOMY     in situ carcinoma partial  . CARDIAC CATHETERIZATION  01/27/11   minor non-obs CAD, NL EF, mild pulm HTN  . CATARACT EXTRACTION, BILATERAL     last done 12'16-  . COLONOSCOPY W/ BIOPSIES AND POLYPECTOMY    . COLONOSCOPY WITH PROPOFOL N/A 02/13/2015   Procedure: COLONOSCOPY WITH PROPOFOL;  Surgeon: Jeneen Rinks  Oletta Lamas, MD;  Location: [facility];  Service: Endoscopy;  Laterality: N/A;  . ESOPHAGOGASTRODUODENOSCOPY (EGD) WITH PROPOFOL N/A 09/30/2016   Procedure: ESOPHAGOGASTRODUODENOSCOPY (EGD) WITH PROPOFOL;  Surgeon: Laurence Spates, MD;  Location: [facility];  Service: Endoscopy;  Laterality: N/A;  . LYMPH NODE DISSECTION Left 04/12/2013   Procedure: LYMPH NODE DISSECTION;  Surgeon: [name], MD;  Location: [facility];  Service: Thoracic;  Laterality: Left;  . RIGHT/LEFT HEART CATH AND CORONARY ANGIOGRAPHY N/A 10/08/2016   Procedure: RIGHT/LEFT HEART CATH AND CORONARY ANGIOGRAPHY;  Surgeon: Burnell Blanks,  MD;  Location: [facility];  Service: Cardiovascular;  Laterality: N/A;  . TONSILLECTOMY    . VESICOVAGINAL FISTULA CLOSURE W/ TAH  1971  . VIDEO ASSISTED THORACOSCOPY (VATS)/THOROCOTOMY Left 04/12/2013   Procedure: VIDEO ASSISTED THORACOSCOPY (VATS)/THOROCOTOMY, WITH LEFT UPPER LOBE WEDGE RESECTION, CHEST WALL BIOPSY ;  Surgeon: [name], MD;  Location: [facility];  Service: Thoracic;  Laterality: Left;  Marland Kitchen VIDEO BRONCHOSCOPY N/A 04/12/2013   Procedure: VIDEO BRONCHOSCOPY;  Surgeon: [name], MD;  [facility];  Service: Thoracic;  Laterality: N/A;    Social History   Socioeconomic History  . Marital status: Widowed    Spouse name: Not on file  . Number of children: 2  . Years of education: Not on file  . Highest education level: Not on file  Occupational History  . Occupation: Nurse, adult: [employer]  Social Needs  . Financial resource strain: Not on file  . Food insecurity:    Worry: Not on file    Inability: Not on file  . Transportation needs:    Medical: Not on file    Non-medical: Not on file  Tobacco Use  . Smoking status: Former Smoker    Packs/day: 1.00    Years: 35.00    Pack years: 35.00    Types: Cigarettes    Last attempt to quit: 01/07/1988    Years since quitting: 29.6  . Smokeless tobacco: Never Used  Substance and Sexual Activity  . Alcohol use: No    Comment: not since 1988  . Drug use: No  . Sexual activity: Never  Lifestyle  . Physical activity:    Days per week: Not on file    Minutes per session: Not on file  . Stress: Not on file  Relationships  . Social connections:    Talks on phone: Not on file    Gets together: Not on file    Attends religious service: Not on file    Active member of club or organization: Not on file    Attends meetings of clubs or organizations: Not on file    Relationship status: Not on file  . Intimate partner violence:    Fear of current or ex partner: Not on file    Emotionally abused: Not on  file    Physically abused: Not on file    Forced sexual activity: Not on file  Other Topics Concern  . Not on file  Social History Narrative  . Not on file    Family History  Problem Relation Age of Onset  . Lung cancer Mother   . Coronary artery disease Son   . Heart attack Son 35  . Arthritis/Rheumatoid Maternal Grandmother   . Coronary artery disease Son   . Arthritis/Rheumatoid Son     ROS: no fevers or chills, productive cough, hemoptysis, dysphasia, odynophagia, melena, hematochezia, dysuria, hematuria, rash, seizure activity, orthopnea, PND, pedal edema, claudication.  Remaining systems are negative.  Physical Exam: Well-developed well-nourished in no acute distress.  Skin is warm and dry.  HEENT is normal.  Neck is supple.  Chest diminished BS throughout Cardiovascular exam is regular rate and rhythm. 2/6 systolic murmur Abdominal exam nontender or distended. No masses palpated. Extremities show no edema. neuro grossly intact  A/P  1 nonischemic cardiomyopathy-plan to continue beta-blocker and ARB.  She declined Entresto.  She declined ICD.  2 hypertension-blood pressure is controlled.  Continue present medications.  3 hyperlipidemia-continue statin.  4 dyspnea-this has been felt to be multifactorial including COPD, history of lung cancer status post lung resection, obstructive sleep apnea and obesity hypoventilation syndrome.  Filling pressures normal at previous catheterization.  Kirk Ruths, MD

## 2017-08-14 ENCOUNTER — Encounter: Payer: Self-pay | Admitting: Cardiology

## 2017-08-14 ENCOUNTER — Ambulatory Visit (INDEPENDENT_AMBULATORY_CARE_PROVIDER_SITE_OTHER): Payer: PPO | Admitting: Cardiology

## 2017-08-14 VITALS — BP 115/72 | HR 60 | Ht 62.0 in | Wt 171.4 lb

## 2017-08-14 DIAGNOSIS — R0600 Dyspnea, unspecified: Secondary | ICD-10-CM

## 2017-08-14 DIAGNOSIS — I10 Essential (primary) hypertension: Secondary | ICD-10-CM

## 2017-08-14 DIAGNOSIS — I428 Other cardiomyopathies: Secondary | ICD-10-CM

## 2017-08-14 NOTE — Patient Instructions (Signed)
Your physician wants you to follow-up in: 6 MONTHS WITH DR CRENSHAW You will receive a reminder letter in the mail two months in advance. If you don't receive a letter, please call our office to schedule the follow-up appointment.   If you need a refill on your cardiac medications before your next appointment, please call your pharmacy.  

## 2017-09-22 DIAGNOSIS — R0602 Shortness of breath: Secondary | ICD-10-CM | POA: Diagnosis not present

## 2017-09-22 DIAGNOSIS — R232 Flushing: Secondary | ICD-10-CM | POA: Diagnosis not present

## 2017-09-22 DIAGNOSIS — M0589 Other rheumatoid arthritis with rheumatoid factor of multiple sites: Secondary | ICD-10-CM | POA: Diagnosis not present

## 2017-09-22 DIAGNOSIS — Z79899 Other long term (current) drug therapy: Secondary | ICD-10-CM | POA: Diagnosis not present

## 2017-09-22 DIAGNOSIS — M150 Primary generalized (osteo)arthritis: Secondary | ICD-10-CM | POA: Diagnosis not present

## 2017-09-22 DIAGNOSIS — Z6829 Body mass index (BMI) 29.0-29.9, adult: Secondary | ICD-10-CM | POA: Diagnosis not present

## 2017-09-22 DIAGNOSIS — C3490 Malignant neoplasm of unspecified part of unspecified bronchus or lung: Secondary | ICD-10-CM | POA: Diagnosis not present

## 2017-09-22 DIAGNOSIS — E663 Overweight: Secondary | ICD-10-CM | POA: Diagnosis not present

## 2017-09-22 DIAGNOSIS — R29898 Other symptoms and signs involving the musculoskeletal system: Secondary | ICD-10-CM | POA: Diagnosis not present

## 2017-09-22 DIAGNOSIS — R682 Dry mouth, unspecified: Secondary | ICD-10-CM | POA: Diagnosis not present

## 2017-09-22 DIAGNOSIS — M810 Age-related osteoporosis without current pathological fracture: Secondary | ICD-10-CM | POA: Diagnosis not present

## 2017-09-29 ENCOUNTER — Other Ambulatory Visit: Payer: Self-pay | Admitting: Cardiology

## 2017-09-29 DIAGNOSIS — L905 Scar conditions and fibrosis of skin: Secondary | ICD-10-CM | POA: Diagnosis not present

## 2017-09-29 DIAGNOSIS — Z85828 Personal history of other malignant neoplasm of skin: Secondary | ICD-10-CM | POA: Diagnosis not present

## 2017-09-29 DIAGNOSIS — E7800 Pure hypercholesterolemia, unspecified: Secondary | ICD-10-CM

## 2017-09-29 DIAGNOSIS — L820 Inflamed seborrheic keratosis: Secondary | ICD-10-CM | POA: Diagnosis not present

## 2017-09-29 DIAGNOSIS — L853 Xerosis cutis: Secondary | ICD-10-CM | POA: Diagnosis not present

## 2017-09-29 DIAGNOSIS — L570 Actinic keratosis: Secondary | ICD-10-CM | POA: Diagnosis not present

## 2017-10-06 ENCOUNTER — Encounter: Payer: Self-pay | Admitting: Allergy and Immunology

## 2017-10-06 ENCOUNTER — Ambulatory Visit (INDEPENDENT_AMBULATORY_CARE_PROVIDER_SITE_OTHER): Payer: PPO | Admitting: Allergy and Immunology

## 2017-10-06 VITALS — BP 118/74 | HR 68 | Resp 20

## 2017-10-06 DIAGNOSIS — J449 Chronic obstructive pulmonary disease, unspecified: Secondary | ICD-10-CM | POA: Diagnosis not present

## 2017-10-06 DIAGNOSIS — R197 Diarrhea, unspecified: Secondary | ICD-10-CM

## 2017-10-06 DIAGNOSIS — T783XXD Angioneurotic edema, subsequent encounter: Secondary | ICD-10-CM

## 2017-10-06 DIAGNOSIS — J4489 Other specified chronic obstructive pulmonary disease: Secondary | ICD-10-CM

## 2017-10-06 NOTE — Patient Instructions (Addendum)
  1.  Continue ranitidine 150 mg twice a day  2.  Continue Colestipol 1 g tablet 1-2 times a day for diarrhea  3.  Continue Symbicort 160 - 2 inhalations twice a day with spacer (samples)  4.  Can use pro-air HFA 2 inhalations every 4-6 hours.  5.  Return to clinic in 12 weeks or earlier if problem  6.  Obtain fall flu vaccine

## 2017-10-06 NOTE — Progress Notes (Signed)
Follow-up Note  Referring Provider: Kelton Pillar, MD Primary Provider: Kelton Pillar, MD Date of Office Visit: 10/06/2017  Subjective:   Taylor Berry (DOB: 01/29/1940) is a 77 y.o. female who returns to the [facility] on 10/06/2017 in re-evaluation of the following:  HPI: Shawntel returns to this clinic in reevaluation of COPD with asthma, diarrhea, and a history of lip swelling.  I last saw her in this clinic on 07 July 2017 at which point in time we placed her on Symbicort for her COPD and colestipol for her diarrhea.  Symbicort helped her significantly.  It decreased her wheezing and coughing and some of her shortness of breath.  But it was too expensive for her to continue on this medication.  Colestipol has basically eliminated her diarrhea.  She rarely has episodes of diarrhea at this point in time.  The only time she gets a problem with diarrhea if she eats too much sugar such as pancakes with maple syrup.  She has not been having any swelling of her lips.  Allergies as of 10/06/2017      Reactions   Latex    REACTION: itching   Ace Inhibitors    REACTION: cough   Fentanyl    Hallucination "I go crazy"    Hydrochlorothiazide    REACTION: decreased sodium   Penicillins Other (See Comments)   REACTION: intolerant due to yeast infection Has patient had a PCN reaction causing immediate rash, facial/tongue/throat swelling, SOB or lightheadedness with hypotension:No Has patient had a PCN reaction causing severe rash involving mucus membranes or skin necrosis: No Has patient had a PCN reaction that required hospitalization No Has patient had a PCN reaction occurring within the last 10 years: Yes If all of the above answers are "NO", then may proceed with Cephalosporin use.      Medication List      atorvastatin 20 MG tablet Commonly known as:  LIPITOR TAKE 1 TABLET(20 MG) BY MOUTH DAILY   BIOTIN PO Take by mouth.   budesonide-formoterol 160-4.5  MCG/ACT inhaler Commonly known as:  SYMBICORT Inhale 2 puffs into the lungs 2 (two) times daily.   candesartan 4 MG tablet Commonly known as:  ATACAND Take 1 tablet (4 mg total) by mouth daily.   colestipol 1 g tablet Commonly known as:  COLESTID 1 tablet by mouth 1-2 times daily as needed for diarrhea   denosumab 60 MG/ML Soln injection Commonly known as:  PROLIA Inject 60 mg into the skin every 6 (six) months. Administer in upper arm, thigh, or abdomen   leflunomide 20 MG tablet Commonly known as:  ARAVA Take 20 mg by mouth daily.   metoprolol succinate 50 MG 24 hr tablet Commonly known as:  TOPROL-XL Take 1 tablet (50 mg total) by mouth daily.   ranitidine 150 MG capsule Commonly known as:  ZANTAC Take 1 capsule (150 mg total) by mouth 2 (two) times daily.   Venlafaxine HCl 75 MG Tb24 Take 75 mg by mouth daily.       Past Medical History:  Diagnosis Date  . Abnormal CT scan   . Allergic rhinitis   . Anemia 2015   after lung surgery  . Angio-edema   . Complication of anesthesia    Fentanyl allergy, "claustrophobia"  . COPD (chronic obstructive pulmonary disease) ([place])   . Depression   . Diaphoresis 07/30/2015   Excessive sweating with little exertion  . Difficult intravenous access   . GERD (gastroesophageal  reflux disease)    In the past  . Hyperlipidemia   . Hypertension   . Left bundle branch block   . Lung cancer ([place]) 2015   left upper lobe wedge removed-no further tx.  . Neuropathy    Tingling in arms, fingers, and feet (Since 06/15/16)  . Obesity   . OSA (obstructive sleep apnea)    denies.  . Osteoarthritis    ra also  . Osteoporosis   . Recurrent upper respiratory infection (URI)   . Rheumatoid arthritis(714.0)    Dr. Stark Jock follows  . SOB (shortness of breath) on exertion   . Uterine cancer ([place]) 1971   tx surgical    Past Surgical History:  Procedure Laterality Date  . ABDOMINAL HYSTERECTOMY     in situ carcinoma partial  .  CARDIAC CATHETERIZATION  01/27/11   minor non-obs CAD, NL EF, mild pulm HTN  . CATARACT EXTRACTION, BILATERAL     last done 12'16-  . COLONOSCOPY W/ BIOPSIES AND POLYPECTOMY    . COLONOSCOPY WITH PROPOFOL N/A 02/13/2015   Procedure: COLONOSCOPY WITH PROPOFOL;  Surgeon: Laurence Spates, MD;  Location: [facility];  Service: Endoscopy;  Laterality: N/A;  . ESOPHAGOGASTRODUODENOSCOPY (EGD) WITH PROPOFOL N/A 09/30/2016   Procedure: ESOPHAGOGASTRODUODENOSCOPY (EGD) WITH PROPOFOL;  Surgeon: Laurence Spates, MD;  [facility]: [facility];  Service: Endoscopy;  Laterality: N/A;  . LYMPH NODE DISSECTION Left 04/12/2013   Procedure: LYMPH NODE DISSECTION;  Surgeon: [name], MD;  Location: [facility];  Service: Thoracic;  Laterality: Left;  . RIGHT/LEFT HEART CATH AND CORONARY ANGIOGRAPHY N/A 10/08/2016   Procedure: RIGHT/LEFT HEART CATH AND CORONARY ANGIOGRAPHY;  Surgeon: Burnell Blanks, MD;  Location: [place];  Service: Cardiovascular;  Laterality: N/A;  . TONSILLECTOMY    . VESICOVAGINAL FISTULA CLOSURE W/ TAH  1971  . VIDEO ASSISTED THORACOSCOPY (VATS)/THOROCOTOMY Left 04/12/2013   Procedure: VIDEO ASSISTED THORACOSCOPY (VATS)/THOROCOTOMY, WITH LEFT UPPER LOBE WEDGE RESECTION, CHEST WALL BIOPSY ;  Surgeon: [name], MD;  Location: [facility];  Service: Thoracic;  Laterality: Left;  Marland Kitchen VIDEO BRONCHOSCOPY N/A 04/12/2013   Procedure: VIDEO BRONCHOSCOPY;  Surgeon: [name], MD;  Location: [facility] OR;  Service: Thoracic;  Laterality: N/A;    Review of systems negative except as noted in HPI / PMHx or noted below:  Review of Systems  Constitutional: Negative.   HENT: Negative.   Eyes: Negative.   Respiratory: Negative.   Cardiovascular: Negative.   Gastrointestinal: Negative.   Genitourinary: Negative.   Musculoskeletal: Negative.   Skin: Negative.   Neurological: Negative.   Endo/Heme/Allergies: Negative.   Psychiatric/Behavioral: Negative.      Objective:   Vitals:    10/06/17 0951  BP: 118/74  Pulse: 68  Resp: 20          Physical Exam  HENT:  Head: Normocephalic.  Right Ear: Tympanic membrane, external ear and ear canal normal.  Left Ear: Tympanic membrane, external ear and ear canal normal.  Nose: Nose normal. No mucosal edema or rhinorrhea.  Mouth/Throat: Uvula is midline, oropharynx is clear and moist and mucous membranes are normal. No oropharyngeal exudate.  Eyes: Conjunctivae are normal.  Neck: Trachea normal. No tracheal tenderness present. No tracheal deviation present. No thyromegaly present.  Cardiovascular: Normal rate, regular rhythm, S1 normal, S2 normal and normal heart sounds.  No murmur heard. Pulmonary/Chest: Breath sounds normal. No stridor. No respiratory distress. She has no wheezes. She has no rales.  Musculoskeletal: She exhibits no edema.  Lymphadenopathy:  Head (right side): No tonsillar adenopathy present.       Head (left side): No tonsillar adenopathy present.    She has no cervical adenopathy.  Neurological: She is alert.  Skin: No rash noted. She is not diaphoretic. No erythema. Nails show no clubbing.    Diagnostics:    Spirometry was performed and demonstrated an FEV1 of 0.70 at 38 % of predicted.  The patient had an Asthma Control Test with the following results: ACT Total Score: 19.    Assessment and Plan:   1. COPD with asthma ([place])   2. Diarrhea, unspecified type   3. Angioedema, subsequent encounter     1.  Continue ranitidine 150 mg twice a day  2.  Continue Colestipol 1 g tablet 1-2 times a day for diarrhea  3.  Continue Symbicort 160 - 2 inhalations twice a day with spacer (samples)  4.  Can use pro-air HFA 2 inhalations every 4-6 hours.  5.  Return to clinic in 12 weeks or earlier if problem  6.  Obtain fall flu vaccine  Eleny appears to have shown some improvement regarding her COPD and diarrhea with therapy noted above and we will continue her on colestipol and Symbicort.  Her  angioedematous episodes involving her lip have appeared to be under very good control with the use of ranitidine at this point.  I will now see her back in this clinic in 12 weeks or earlier if there is a problem.  Allena Katz, MD Allergy / Immunology [place]

## 2017-10-07 ENCOUNTER — Encounter: Payer: Self-pay | Admitting: Allergy and Immunology

## 2017-10-14 DIAGNOSIS — E559 Vitamin D deficiency, unspecified: Secondary | ICD-10-CM | POA: Diagnosis not present

## 2017-10-14 DIAGNOSIS — M810 Age-related osteoporosis without current pathological fracture: Secondary | ICD-10-CM | POA: Diagnosis not present

## 2017-10-14 DIAGNOSIS — Z23 Encounter for immunization: Secondary | ICD-10-CM | POA: Diagnosis not present

## 2017-10-15 ENCOUNTER — Telehealth: Payer: Self-pay | Admitting: Allergy and Immunology

## 2017-10-15 MED ORDER — FAMOTIDINE 20 MG PO TABS: 20.0000 mg | ORAL_TABLET | Freq: Two times a day (BID) | ORAL | 2 refills | Status: DC

## 2017-10-15 NOTE — Telephone Encounter (Signed)
Called and informed patient we have switched her to Pepcid 20 mg BID. Patient verbalized understanding.

## 2017-10-15 NOTE — Telephone Encounter (Signed)
Pt called and needs to have her Ranitidine change to something else [facility] [address] [phone].

## 2017-10-20 ENCOUNTER — Other Ambulatory Visit: Payer: Self-pay

## 2017-10-20 NOTE — Patient Outreach (Signed)
[facility]) Care Management  10/20/2017  Taylor Berry March 14, 1940 [id number]  TELEPHONE SCREENING Referral date: 10/02/17 Referral source: HTA referral Referral reason: medication assistance Insurance: HTA  Telephone call to patient regarding HTA referral. HIPAA verified with patient. Explained reason for call. Patient states she has had some side effects that has caused her lips and tongue to swell.  Patient reports her and her doctor are in the process of determining whether its the Symbicort or something else that may be causing the side effect. Patient states she is not in need of medication assistance at this time. RNCM provided patient [facility] care management contact name and phone number and advised patient to call if further assistance is needed. Patient verbalized understanding.   PLAN; RNCM will close patient due to patient being assessed and having no further needs.   Quinn Plowman RN,BSN,CCM [facility] Telephonic  [phone]

## 2017-11-10 DIAGNOSIS — Z803 Family history of malignant neoplasm of breast: Secondary | ICD-10-CM | POA: Diagnosis not present

## 2017-11-10 DIAGNOSIS — Z1231 Encounter for screening mammogram for malignant neoplasm of breast: Secondary | ICD-10-CM | POA: Diagnosis not present

## 2017-11-12 DIAGNOSIS — M0589 Other rheumatoid arthritis with rheumatoid factor of multiple sites: Secondary | ICD-10-CM | POA: Diagnosis not present

## 2017-12-11 ENCOUNTER — Other Ambulatory Visit: Payer: Self-pay | Admitting: *Deleted

## 2017-12-11 DIAGNOSIS — Z85118 Personal history of other malignant neoplasm of bronchus and lung: Secondary | ICD-10-CM

## 2017-12-22 DIAGNOSIS — E663 Overweight: Secondary | ICD-10-CM | POA: Diagnosis not present

## 2017-12-22 DIAGNOSIS — C3490 Malignant neoplasm of unspecified part of unspecified bronchus or lung: Secondary | ICD-10-CM | POA: Diagnosis not present

## 2017-12-22 DIAGNOSIS — Z6828 Body mass index (BMI) 28.0-28.9, adult: Secondary | ICD-10-CM | POA: Diagnosis not present

## 2017-12-22 DIAGNOSIS — M810 Age-related osteoporosis without current pathological fracture: Secondary | ICD-10-CM | POA: Diagnosis not present

## 2017-12-22 DIAGNOSIS — R0602 Shortness of breath: Secondary | ICD-10-CM | POA: Diagnosis not present

## 2017-12-22 DIAGNOSIS — M0589 Other rheumatoid arthritis with rheumatoid factor of multiple sites: Secondary | ICD-10-CM | POA: Diagnosis not present

## 2017-12-22 DIAGNOSIS — R232 Flushing: Secondary | ICD-10-CM | POA: Diagnosis not present

## 2017-12-22 DIAGNOSIS — Z79899 Other long term (current) drug therapy: Secondary | ICD-10-CM | POA: Diagnosis not present

## 2017-12-22 DIAGNOSIS — M150 Primary generalized (osteo)arthritis: Secondary | ICD-10-CM | POA: Diagnosis not present

## 2017-12-22 DIAGNOSIS — R682 Dry mouth, unspecified: Secondary | ICD-10-CM | POA: Diagnosis not present

## 2017-12-22 DIAGNOSIS — R29898 Other symptoms and signs involving the musculoskeletal system: Secondary | ICD-10-CM | POA: Diagnosis not present

## 2018-01-12 ENCOUNTER — Encounter: Payer: Self-pay | Admitting: Allergy and Immunology

## 2018-01-12 ENCOUNTER — Ambulatory Visit (INDEPENDENT_AMBULATORY_CARE_PROVIDER_SITE_OTHER): Payer: HMO | Admitting: Allergy and Immunology

## 2018-01-12 VITALS — BP 114/88 | HR 70 | Resp 20

## 2018-01-12 DIAGNOSIS — T783XXD Angioneurotic edema, subsequent encounter: Secondary | ICD-10-CM | POA: Diagnosis not present

## 2018-01-12 DIAGNOSIS — J4489 Other specified chronic obstructive pulmonary disease: Secondary | ICD-10-CM

## 2018-01-12 DIAGNOSIS — J3089 Other allergic rhinitis: Secondary | ICD-10-CM | POA: Diagnosis not present

## 2018-01-12 DIAGNOSIS — J449 Chronic obstructive pulmonary disease, unspecified: Secondary | ICD-10-CM

## 2018-01-12 DIAGNOSIS — R197 Diarrhea, unspecified: Secondary | ICD-10-CM | POA: Diagnosis not present

## 2018-01-12 DIAGNOSIS — K219 Gastro-esophageal reflux disease without esophagitis: Secondary | ICD-10-CM | POA: Diagnosis not present

## 2018-01-12 MED ORDER — ALBUTEROL SULFATE HFA 108 (90 BASE) MCG/ACT IN AERS: INHALATION_SPRAY | RESPIRATORY_TRACT | 1 refills | Status: DC

## 2018-01-12 MED ORDER — FLUTICASONE PROPIONATE 50 MCG/ACT NA SUSP: NASAL | 5 refills | Status: DC

## 2018-01-12 MED ORDER — FLUTICASONE-UMECLIDIN-VILANT 100-62.5-25 MCG/INH IN AEPB: 1.0000 | INHALATION_SPRAY | Freq: Every day | RESPIRATORY_TRACT | 2 refills | Status: DC

## 2018-01-12 MED ORDER — LANSOPRAZOLE 30 MG PO CPDR: DELAYED_RELEASE_CAPSULE | ORAL | 5 refills | Status: DC

## 2018-01-12 MED ORDER — COLESTIPOL HCL 1 G PO TABS: ORAL_TABLET | ORAL | 5 refills | Status: DC

## 2018-01-12 MED ORDER — FAMOTIDINE 40 MG PO TABS: 40.0000 mg | ORAL_TABLET | Freq: Every evening | ORAL | 5 refills | Status: DC

## 2018-01-12 NOTE — Progress Notes (Signed)
Follow-up Note  Referring Provider: Kelton Pillar, MD Primary Provider: Kelton Pillar, MD Date of Office Visit: 01/12/2018  Subjective:   Taylor Berry (DOB: April 29, 1940) is a 78 y.o. female who returns to the [facility] on 01/12/2018 in re-evaluation of the following:  HPI: Taylor Berry returns to this clinic in reevaluation of her COPD with asthma, diarrhea, and a history of lip swelling.  I had last seen her in this clinic on 07 October 2017.  She had to discontinue her Symbicort because it made her mouth dry.  She has noticed a little bit more issues with dyspnea on exertion which has always been a longstanding issue the past several years.  She does not use a short acting bronchodilator.  She has not required a systemic steroid or antibiotic for any type of respiratory tract issue.  She has been using some Flonase on and off which she thinks helps some of her chronic postnasal drip.  However, even in the face of Flonase she still has throat clearing and postnasal drip.  She does have reflux and she is been using Prevacid but is no longer using an H2 receptor blocker.  She has not had any episodes of lip swelling.  Her diarrhea is under excellent control with the use of colestipol.  She did obtain the flu vaccine.  Allergies as of 01/12/2018      Reactions   Latex    REACTION: itching   Ace Inhibitors    REACTION: cough   Fentanyl    Hallucination "I go crazy"    Hydrochlorothiazide    REACTION: decreased sodium   Penicillins Other (See Comments)   REACTION: intolerant due to yeast infection Has patient had a PCN reaction causing immediate rash, facial/tongue/throat swelling, SOB or lightheadedness with hypotension:No Has patient had a PCN reaction causing severe rash involving mucus membranes or skin necrosis: No Has patient had a PCN reaction that required hospitalization No Has patient had a PCN reaction occurring within the last 10 years: Yes If all of  the above answers are "NO", then may proceed with Cephalosporin use.      Medication List      albuterol 108 (90 Base) MCG/ACT inhaler Commonly known as:  PROVENTIL HFA;VENTOLIN HFA Use 2 puffs per nostril every 4-6 hours as needed   atorvastatin 20 MG tablet Commonly known as:  LIPITOR TAKE 1 TABLET(20 MG) BY MOUTH DAILY   BIOTIN PO Take by mouth.   candesartan 4 MG tablet Commonly known as:  ATACAND Take 1 tablet (4 mg total) by mouth daily.   colestipol 1 g tablet Commonly known as:  COLESTID 1 tablet by mouth 1-2 times daily as needed for diarrhea   denosumab 60 MG/ML Soln injection Commonly known as:  PROLIA Inject 60 mg into the skin every 6 (six) months. Administer in upper arm, thigh, or abdomen   famotidine 40 MG tablet Commonly known as:  PEPCID Take 1 tablet (40 mg total) by mouth every evening.   fluticasone 50 MCG/ACT nasal spray Commonly known as:  FLONASE Use 1-2 sprays per nostril daily   Fluticasone-Umeclidin-Vilant 100-62.5-25 MCG/INH Aepb Commonly known as:  TRELEGY ELLIPTA Inhale 1 puff into the lungs daily.   lansoprazole 30 MG capsule Commonly known as:  PREVACID Take 1 capsule daily in the morning   leflunomide 20 MG tablet Commonly known as:  ARAVA Take 20 mg by mouth daily.   metoprolol succinate 50 MG 24 hr tablet Commonly known as:  TOPROL XL Take 1 tablet (50 mg total) by mouth daily.   PRILOSEC 20 MG capsule Generic drug:  omeprazole Take 20 mg by mouth daily.   Venlafaxine HCl 75 MG Tb24 Take 75 mg by mouth daily.       Past Medical History:  Diagnosis Date  . Abnormal CT scan   . Allergic rhinitis   . Anemia 2015   after lung surgery  . Angio-edema   . Complication of anesthesia    Fentanyl allergy, "claustrophobia"  . COPD (chronic obstructive pulmonary disease) ([place])   . Depression   . Diaphoresis 07/30/2015   Excessive sweating with little exertion  . Difficult intravenous access   . GERD (gastroesophageal  reflux disease)    In the past  . Hyperlipidemia   . Hypertension   . Left bundle branch block   . Lung cancer ([place]) 2015   left upper lobe wedge removed-no further tx.  . Neuropathy    Tingling in arms, fingers, and feet (Since 06/15/16)  . Obesity   . OSA (obstructive sleep apnea)    denies.  . Osteoarthritis    ra also  . Osteoporosis   . Recurrent upper respiratory infection (URI)   . Rheumatoid arthritis(714.0)    Dr. Stark Jock follows  . SOB (shortness of breath) on exertion   . Uterine cancer ([place]) 1971   tx surgical    Past Surgical History:  Procedure Laterality Date  . ABDOMINAL HYSTERECTOMY     in situ carcinoma partial  . CARDIAC CATHETERIZATION  01/27/11   minor non-obs CAD, NL EF, mild pulm HTN  . CATARACT EXTRACTION, BILATERAL     last done 12'16-  . COLONOSCOPY W/ BIOPSIES AND POLYPECTOMY    . COLONOSCOPY WITH PROPOFOL N/A 02/13/2015   Procedure: COLONOSCOPY WITH PROPOFOL;  Surgeon: Laurence Spates, MD;  Location: [facility];  Service: Endoscopy;  Laterality: N/A;  . ESOPHAGOGASTRODUODENOSCOPY (EGD) WITH PROPOFOL N/A 09/30/2016   Procedure: ESOPHAGOGASTRODUODENOSCOPY (EGD) WITH PROPOFOL;  Surgeon: Laurence Spates, MD;  Location: [facility];  Service: Endoscopy;  Laterality: N/A;  . LYMPH NODE DISSECTION Left 04/12/2013   Procedure: LYMPH NODE DISSECTION;  Surgeon: [name], MD;  Location: [facility];  Service: Thoracic;  Laterality: Left;  . RIGHT/LEFT HEART CATH AND CORONARY ANGIOGRAPHY N/A 10/08/2016   Procedure: RIGHT/LEFT HEART CATH AND CORONARY ANGIOGRAPHY;  Surgeon: Burnell Blanks, MD;  Location: [facility];  Service: Cardiovascular;  Laterality: N/A;  . TONSILLECTOMY    . VESICOVAGINAL FISTULA CLOSURE W/ TAH  1971  . VIDEO ASSISTED THORACOSCOPY (VATS)/THOROCOTOMY Left 04/12/2013   Procedure: VIDEO ASSISTED THORACOSCOPY (VATS)/THOROCOTOMY, WITH LEFT UPPER LOBE WEDGE RESECTION, CHEST WALL BIOPSY ;  Surgeon: [name], MD;  Location: [facility];  Service: Thoracic;  Laterality: Left;  Marland Kitchen VIDEO BRONCHOSCOPY N/A 04/12/2013   Procedure: VIDEO BRONCHOSCOPY;  Surgeon: [name], MD;  Location: [facility] OR;  Service: Thoracic;  Laterality: N/A;    Review of systems negative except as noted in HPI / PMHx or noted below:  Review of Systems  Constitutional: Negative.   HENT: Negative.   Eyes: Negative.   Respiratory: Negative.   Cardiovascular: Negative.   Gastrointestinal: Negative.   Genitourinary: Negative.   Musculoskeletal: Negative.   Skin: Negative.   Neurological: Negative.   Endo/Heme/Allergies: Negative.   Psychiatric/Behavioral: Negative.      Objective:   Vitals:   01/12/18 1124  BP: 114/88  Pulse: 70  Resp: 20  SpO2: 97%  Physical Exam Constitutional:      Appearance: She is not diaphoretic.  HENT:     Head: Normocephalic.     Right Ear: Tympanic membrane, ear canal and external ear normal.     Left Ear: Tympanic membrane, ear canal and external ear normal.     Nose: Nose normal. No mucosal edema or rhinorrhea.     Mouth/Throat:     Pharynx: Uvula midline. No oropharyngeal exudate.  Eyes:     Conjunctiva/sclera: Conjunctivae normal.  Neck:     Thyroid: No thyromegaly.     Trachea: Trachea normal. No tracheal tenderness or tracheal deviation.  Cardiovascular:     Rate and Rhythm: Normal rate and regular rhythm.     Heart sounds: Normal heart sounds, S1 normal and S2 normal. No murmur.  Pulmonary:     Effort: No respiratory distress.     Breath sounds: No stridor. Wheezing (Scattered end expiratory wheezing all lung fields) present. No rales.  Lymphadenopathy:     Head:     Right side of head: No tonsillar adenopathy.     Left side of head: No tonsillar adenopathy.     Cervical: No cervical adenopathy.  Skin:    Findings: No erythema or rash.     Nails: There is no clubbing.   Neurological:     Mental Status: She is alert.     Diagnostics:    Spirometry was performed and  demonstrated an FEV1 of 0.69 at 38 % of predicted.  Assessment and Plan:   1. COPD with asthma ([place])   2. Perennial allergic rhinitis   3. LPRD (laryngopharyngeal reflux disease)   4. Angioedema, subsequent encounter   5. Diarrhea, unspecified type     1.  Treat reflux and LPR with the following combination:   A. Prevacid '30mg'$  1 time per day in AM  B. Famotidine '40mg'$  1 time per day in PM  2.  Continue Colestipol 1 g tablet 1-2 times a day for diarrhea  3. Continue flonase -1-2 sprays each nostril 1 time per day  4.  Try sample of Trelegy - one inhalation one time per day. Does this help with shortness of breath  5.  Can use pro-air HFA 2 inhalations every 4-6 hours.  6.  Return to clinic in 12 weeks or earlier if problem  Taylor Berry will try a sample of trelegy to see if this helps with some of her dyspnea on exertion and her wheezing hopefully without the development of a side effect which she has had with other types of inhalers giving rise to significant drying of her mouth.  She will continue on Flonase for upper airway inflammation and use a combination of therapy to treat her reflux and LPR.  Her diarrhea is under excellent control with her colestipol and she can continue on that agent at a dose determined by her based upon the activity of her disease state.  I will see her back in this clinic in 3 months or earlier if there is a problem to assess her response to this treatment plan.  Allena Katz, MD Allergy / Immunology [place]

## 2018-01-12 NOTE — Patient Instructions (Addendum)
  1.  Treat reflux and LPR with the following combination:   A. Prevacid 30mg  1 time per day in AM  B. Famotidine 40mg  1 time per day in PM  2.  Continue Colestipol 1 g tablet 1-2 times a day for diarrhea  3. Continue flonase -1-2 sprays each nostril 1 time per day  4.  Try sample of Trelegy - one inhalation one time per day. Does this help with shortness of breath  5.  Can use pro-air HFA 2 inhalations every 4-6 hours.  6.  Return to clinic in 12 weeks or earlier if problem

## 2018-01-13 ENCOUNTER — Encounter: Payer: Self-pay | Admitting: Allergy and Immunology

## 2018-01-14 ENCOUNTER — Other Ambulatory Visit: Payer: Self-pay | Admitting: Allergy and Immunology

## 2018-01-21 ENCOUNTER — Other Ambulatory Visit: Payer: Self-pay

## 2018-01-21 ENCOUNTER — Ambulatory Visit: Payer: HMO | Admitting: Cardiothoracic Surgery

## 2018-01-21 ENCOUNTER — Ambulatory Visit
Admission: RE | Admit: 2018-01-21 | Discharge: 2018-01-21 | Disposition: A | Discharge status: Home / Self Care | Payer: PPO | Source: Ambulatory Visit | Attending: Cardiothoracic Surgery | Admitting: Cardiothoracic Surgery

## 2018-01-21 VITALS — BP 140/70 | HR 68 | Resp 16 | Ht 62.0 in | Wt 167.2 lb

## 2018-01-21 DIAGNOSIS — Z8541 Personal history of malignant neoplasm of cervix uteri: Secondary | ICD-10-CM

## 2018-01-21 DIAGNOSIS — Z85118 Personal history of other malignant neoplasm of bronchus and lung: Secondary | ICD-10-CM | POA: Diagnosis not present

## 2018-01-21 DIAGNOSIS — R918 Other nonspecific abnormal finding of lung field: Secondary | ICD-10-CM | POA: Diagnosis not present

## 2018-01-21 NOTE — Progress Notes (Signed)
[address]       [place] [address]             [phone]      [facility] Medical Record #[id number] Date of Birth: 1940-01-31  Referring: Collene Gobble, MD Primary Care: Kelton Pillar, MD  Chief Complaint:   POST OP FOLLOW UP 04/12/2013  PREOPERATIVE DIAGNOSIS: Squamous cell carcinoma of the left upper lobe  with limited pulmonary reserve.  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  SURGICAL PROCEDURE: Video bronchoscopy, left video-assisted  thoracoscopy, mini-thoracotomy, wedge resection of left upper lobe with  lymph node dissection, and biopsy of chest wall.  SURGEON: Lanelle Bal, MD  Lung cancer, left upper lobe- Squamous cell    Primary site: Lung (Left)   Staging method: AJCC 7th Edition   Pathologic: Stage IB (T2a, N0, cM0) signed by [name], MD on 04/14/2013  8:31 AM   Summary: Stage IB (T2a, N0, cM0)  History of Present Illness:     Patient returns to the office today for  followup  following  wedge resection of left upper lobe for squamous cell carcinoma stage IB on 04/12/2013.   She was evaluated in early 2019 by cardiology with increasing shortness of breath, there was a discussion about placement of a defibrillator but this was not done.  She continues to be short of breath.  She notes that she has purposely lost weight which has helped her respiratory status.   CT scan was done in January 2019 , done by pulmonary.  She returns today for follow-up CT after her lung resection now 5 years ago.  Zubrod Score: At the time of surgery this patient's most appropriate activity status/level should be described as: []     0    Normal activity, no symptoms [x]     1    Restricted in physical strenuous activity but ambulatory, able to do out light work []     2    Ambulatory and capable of self care, unable to do work activities, up and about >50 % of waking  hours                              []     3    Only limited self care, in bed greater than 50% of waking hours []     4    Completely disabled, no self care, confined to bed or chair []     5    Moribund    Past Medical History:  Diagnosis Date  . Abnormal CT scan   . Allergic rhinitis   . Anemia 2015   after lung surgery  . Angio-edema   . Complication of anesthesia    Fentanyl allergy, "claustrophobia"  . COPD (chronic obstructive pulmonary disease) ([facility])   . Depression   . Diaphoresis 07/30/2015   Excessive sweating with little exertion  . Difficult intravenous access   . GERD (gastroesophageal reflux disease)    In the past  . Hyperlipidemia   . Hypertension   . Left bundle branch block   . Lung cancer ([place]) 2015   left upper lobe wedge removed-no further tx.  . Neuropathy    Tingling in arms, fingers, and feet (Since 06/15/16)  . Obesity   . OSA (obstructive sleep apnea)    denies.  . Osteoarthritis  ra also  . Osteoporosis   . Recurrent upper respiratory infection (URI)   . Rheumatoid arthritis(714.0)    Dr. Stark Jock follows  . SOB (shortness of breath) on exertion   . Uterine cancer ([place]) 1971   tx surgical     Social History   Tobacco Use  Smoking Status Former Smoker  . Packs/day: 1.00  . Years: 35.00  . Pack years: 35.00  . Types: Cigarettes  . Last attempt to quit: 01/07/1988  . Years since quitting: 30.0  Smokeless Tobacco Never Used    Social History   Substance and Sexual Activity  Alcohol Use No   Comment: not since 1988     Allergies  Allergen Reactions  . Latex     REACTION: itching  . Ace Inhibitors     REACTION: cough  . Fentanyl     Hallucination "I go crazy"   . Hydrochlorothiazide     REACTION: decreased sodium  . Penicillins Other (See Comments)    REACTION: intolerant due to yeast infection Has patient had a PCN reaction causing immediate rash, facial/tongue/throat swelling, SOB or lightheadedness with hypotension:No Has  patient had a PCN reaction causing severe rash involving mucus membranes or skin necrosis: No Has patient had a PCN reaction that required hospitalization No Has patient had a PCN reaction occurring within the last 10 years: Yes If all of the above answers are "NO", then may proceed with Cephalosporin use.     Current Outpatient Medications  Medication Sig Dispense Refill  . albuterol (PROVENTIL HFA;VENTOLIN HFA) 108 (90 Base) MCG/ACT inhaler INHALE 2 PUFFS BY MOUTH IN EACH NOSTRIL EVERY 4 TO 6 HOURS AS NEEDED 93.5 g 0  . atorvastatin (LIPITOR) 20 MG tablet TAKE 1 TABLET(20 MG) BY MOUTH DAILY 90 tablet 3  . BIOTIN PO Take by mouth.    . candesartan (ATACAND) 4 MG tablet Take 1 tablet (4 mg total) by mouth daily. 30 tablet 12  . colestipol (COLESTID) 1 g tablet 1 tablet by mouth 1-2 times daily as needed for diarrhea 60 tablet 5  . denosumab (PROLIA) 60 MG/ML SOLN injection Inject 60 mg into the skin every 6 (six) months. Administer in upper arm, thigh, or abdomen    . famotidine (PEPCID) 40 MG tablet Take 1 tablet (40 mg total) by mouth every evening. 30 tablet 5  . fluticasone (FLONASE) 50 MCG/ACT nasal spray Use 1-2 sprays per nostril daily 16 g 5  . Fluticasone-Umeclidin-Vilant (TRELEGY ELLIPTA) 100-62.5-25 MCG/INH AEPB Inhale 1 puff into the lungs daily. 60 each 2  . lansoprazole (PREVACID) 30 MG capsule Take 1 capsule daily in the morning 30 capsule 5  . leflunomide (ARAVA) 20 MG tablet Take 20 mg by mouth daily.    . metoprolol succinate (TOPROL-XL) 50 MG 24 hr tablet Take 1 tablet (50 mg total) by mouth daily. 90 tablet 3  . omeprazole (PRILOSEC) 20 MG capsule Take 20 mg by mouth daily.    . Venlafaxine HCl 75 MG TB24 Take 75 mg by mouth daily.      No current facility-administered medications for this visit.        Physical Exam: BP 140/70 (BP Location: Left Arm, Patient Position: Sitting, Cuff Size: Large)   Pulse 68 Comment: SL IRREG  Resp 16   Ht 5\' 2"  (1.575 m)   Wt 167  lb 3.2 oz (75.8 kg)   SpO2 95% Comment: RA  BMI 30.58 kg/m   General appearance: alert and cooperative Head: Normocephalic, without obvious  abnormality, atraumatic Neck: no adenopathy, no carotid bruit, no JVD, supple, symmetrical, trachea midline and thyroid not enlarged, symmetric, no tenderness/mass/nodules Lymph nodes: Cervical, supraclavicular, and axillary nodes normal. Resp: clear to auscultation bilaterally Cardio: regular rate and rhythm, S1, S2 normal, no murmur, click, rub or gallop GI: soft, non-tender; bowel sounds normal; no masses,  no organomegaly Extremities: extremities normal, atraumatic, no cyanosis or edema and Homans sign is negative, no sign of DVT Neurologic: Grossly normal  Diagnostic Studies & Laboratory data:     Recent Radiology Findings: Ct Chest Wo Contrast  Result Date: 01/21/2018 CLINICAL DATA:  Status post left upper lobe wedge resection 04/12/2013 for stage IB squamous cell lung cancer. Patient presents for routine restaging. EXAM: CT CHEST WITHOUT CONTRAST TECHNIQUE: Multidetector CT imaging of the chest was performed following the standard protocol without IV contrast. COMPARISON:  01/22/2017 chest CT. FINDINGS: Cardiovascular: Top-normal heart size. No significant pericardial effusion/thickening. Left anterior descending and right coronary atherosclerosis. Atherosclerotic nonaneurysmal thoracic aorta. Normal caliber pulmonary arteries. Mediastinum/Nodes: No discrete thyroid nodules. Unremarkable esophagus. No pathologically enlarged axillary, mediastinal or hilar lymph nodes, noting limited sensitivity for the detection of hilar adenopathy on this noncontrast study. Lungs/Pleura: No pneumothorax. No pleural effusion. Stable postsurgical changes from wedge resection in the posterior left upper lobe, with associated parenchymal band and mild distortion. Subpleural solid 1.8 x 1.0 cm nodular opacity in the medial right lower lobe (series 8/image 90), minimally  increased from 1.7 x 0.9 cm on 01/22/2017 chest CT and mildly increased from 1.5 x 0.7 cm on 04/17/2016 chest CT. Previously visualized 1.8 cm bulla at the periphery of left upper lobe wedge resection site has decreased to 0.9 cm. No acute consolidative airspace disease or lung masses. Apical right upper lobe 3 mm solid pulmonary nodule (series 8/image 23) is new. Peripheral apical right upper lobe 2 mm solid pulmonary nodule (series 8/image 24) is stable and considered benign. No additional new significant pulmonary nodules. Mild-to-moderate cylindrical and varicoid bronchiectasis throughout the mid to lower lungs bilaterally with associated diffuse bronchial wall thickening, mild patchy tree-in-bud opacity and scattered parenchymal bands. These findings have mildly worsened in the interval. Upper abdomen: Small hiatal hernia. Simple 1.0 cm lateral segment left liver lobe cyst. Colonic diverticulosis. Musculoskeletal: No aggressive appearing focal osseous lesions. Marked thoracic spondylosis. IMPRESSION: 1. Stable postsurgical changes from wedge resection in the posterior left upper lobe, with no findings to suggest local tumor recurrence. 2. Indeterminate subpleural 1.8 x 1.0 cm solid nodular opacity in the medial right lower lobe, which has slowly increased in size over the two prior chest CT studies. Neoplasm can not be excluded. Consider PET-CT for further characterization. 3. Indeterminate new tiny 3 mm solid apical right upper lobe pulmonary nodule, for which follow-up chest CT is recommended in 3-6 months. 4. No additional potential findings of metastatic disease in the chest. 5. Mild-to-moderate cylindrical and varicoid bronchiectasis in the mid to lower lungs bilaterally with associated tree-in-bud opacities, bronchial wall thickening and scattered parenchymal banding, most suggestive of chronic infectious bronchiolitis due to atypical mycobacterial infection (MAI). These findings have mildly worsened in  the interval. Aortic Atherosclerosis (ICD10-I70.0). Electronically Signed   By: Ilona Sorrel M.D.   On: 01/21/2018 12:04   I have independently reviewed the above radiology studies  and reviewed the findings with the patient.   CLINICAL DATA:  Solitary pulmonary nodule.  EXAM: CT CHEST WITH CONTRAST  TECHNIQUE: Multidetector CT imaging of the chest was performed during intravenous contrast administration.  CONTRAST:  15mL ISOVUE-300 IOPAMIDOL (ISOVUE-300) INJECTION 61%  COMPARISON:  04/17/2016.  FINDINGS: Cardiovascular: The heart size is normal. No pericardial effusion. Coronary artery calcification is evident. Atherosclerotic calcification is noted in the wall of the thoracic aorta. Pulmonary scratches that prominence of the main pulmonary arteries (3.0 cm diameter on the right and 3.2 cm diameter on the left) suggests pulmonary arterial hypertension.  Mediastinum/Nodes: No mediastinal lymphadenopathy. There is no hilar lymphadenopathy. The esophagus has normal imaging features. There is no axillary lymphadenopathy.  Lungs/Pleura: Biapical pleural-parenchymal scarring again noted. 3 mm left apical nodule (image 23 series 3) is unchanged. 3 mm perifissural nodule in the left lung seen on image 52 today is stable. Surgical scarring in the posterior left upper lobe is stable. Chronic atelectasis or scarring in the right middle lobe is unchanged in the interval.  No focal airspace consolidation. No pulmonary edema or pleural effusion. Circumferential bronchial wall thickening with changes of emphysema again noted.  Upper Abdomen: Small hypoattenuating lesion in the dome of the left liver is stable and compatible with a cyst. 1.4 x 1.6 cm left adrenal nodule was largely obscured by motion artifact on the previous study and less well demonstrated on earlier studies due to the thicker slice collimation used on those prior exams. This is unchanged in the interval and  also comparing back to 04/13/2014. Imaging features most consistent with benign adrenal adenoma. Diverticular changes are noted in the abdominal segments of the colon. Possible tiny calcified gallstones.  Musculoskeletal: Bone windows reveal no worrisome lytic or sclerotic osseous lesions.  IMPRESSION: 1. Stable exam.  No new or progressive findings. 2. Stable appearance of surgical scarring posterior left upper lobe. 3. Prominence of the main pulmonary arteries raise the question of pulmonary arterial hypertension. 4.  Aortic Atherosclerois (ICD10-170.0) 5.  Emphysema. ([id number]-Z85.9)   Electronically Signed   By: Misty Stanley M.D.   On: 01/22/2017 14:19   Ct Chest Wo Contrast  04/26/2015  CLINICAL DATA:  Lung cancer with left upper lobe resection. Uterine cancer with hysterectomy. Ex-smoker. Follow-up lung nodule. EXAM: CT CHEST WITHOUT CONTRAST TECHNIQUE: Multidetector CT imaging of the chest was performed following the standard protocol without IV contrast. COMPARISON:  11/02/2014 FINDINGS: Mediastinum/Nodes: Aortic and branch vessel atherosclerosis. Mild cardiomegaly. Multivessel coronary artery atherosclerosis. Pulmonary artery enlargement, with the main pulmonary arteries measuring 2.9 and 3.1 cm respectively. 7 mm precarinal node is unchanged and not pathologic by size criteria. Hilar regions poorly evaluated without intravenous contrast. Lungs/Pleura: No pleural fluid. Lower lobe predominant bronchial wall thickening. Mild centrilobular emphysema. 2 mm right apical pulmonary nodule is unchanged on image 19/series 4. Right middle lobe volume loss and mild bronchiectasis. Similar. Bibasilar bronchial wall thickening is moderate and likely post infectious or inflammatory. Posterior left upper lobe wedge resection. Similar appearance of for trace residual loculated pleural air. Example image 52/series 4. Nodularity along the surgical sutures is resolved. Similar minimal thickening of  the left major fissure image 41/series 4. Upper abdomen: Left hepatic lobe subcentimeter cyst. Probable gallstones. Normal imaged portions of the spleen, stomach, pancreas, adrenal glands, kidneys. Musculoskeletal: Moderate thoracic spondylosis IMPRESSION: 1. Status post left upper lobe wedge resection, without recurrent or metastatic disease. 2. Decrease in nodularity along the surgical sutures. Persistent loculated trace left pleural air, postoperative. 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 3.  Atherosclerosis, including within the coronary arteries. 4. Pulmonary artery enlargement suggests pulmonary arterial hypertension. Electronically Signed   By: Abigail Miyamoto M.D.   On: 04/26/2015 14:14   Ct Chest Wo Contrast  11/02/2014  CLINICAL DATA:  Lung cancer status post left upper lobe wedge resection in 04/2013. History of uterine cancer status post hysterectomy in 1971. EXAM: CT CHEST WITHOUT CONTRAST TECHNIQUE: Multidetector CT imaging of the chest was performed following the standard protocol without IV contrast. COMPARISON:  04/13/2014 FINDINGS: Mediastinum/Nodes: The heart is normal in size. No pericardial effusion. Coronary atherosclerosis. Atherosclerotic calcifications of the aortic arch. Small mediastinal lymph nodes measuring up to 7 mm short axis (series 3/ image 25), unchanged. Visualized left thyroid is mildly enlarged/heterogeneous. Lungs/Pleura: Postsurgical changes related to posterior left upper lobe wedge resection (series 4/image 28). Minimal nodularity along the left fissure (series 4/ image 22), postsurgical. 2-3 mm subpleural nodule in the lateral right lung apex (series 4/image 11, unchanged since 2015, benign. No suspicious pulmonary nodules. Mild scarring with bronchiectasis in the right middle lobe (series 4/image 40). Mild bronchial wall thickening with subpleural reticulation/ fibrosis in the bilateral lower lobes. No focal consolidation. Underlying mild paraseptal emphysematous  changes. No pleural effusion or pneumothorax. Upper abdomen: Visualized upper abdomen is notable for a 10 mm left adrenal adenoma. Musculoskeletal: Degenerative changes of the visualized thoracolumbar spine. IMPRESSION: Postsurgical changes related to left upper lobe wedge resection. No evidence of recurrent or metastatic disease in the chest. Electronically Signed   By: Julian Hy M.D.   On: 11/02/2014 15:14   Ct Chest Wo Contrast  04/13/2014   CLINICAL DATA:  78 year old female with left upper lobe lung cancer status post resection 6 months ago. Tenderness at the surgical site.  EXAM: CT CHEST WITHOUT CONTRAST  TECHNIQUE: Multidetector CT imaging of the chest was performed following the standard protocol without IV contrast.  COMPARISON:  Chest CT 10/06/2013.  FINDINGS: Mediastinum/Lymph Nodes: Heart size is normal. There is no significant pericardial fluid, thickening or pericardial calcification. There is atherosclerosis of the thoracic aorta, the great vessels of the mediastinum and the coronary arteries, including calcified atherosclerotic plaque in the left main, left anterior descending and right coronary arteries. No pathologically enlarged mediastinal or hilar lymph nodes. Please note that accurate exclusion of hilar adenopathy is limited on noncontrast CT scans. Esophagus is unremarkable in appearance. No axillary lymphadenopathy.  Lungs/Pleura: Postoperative changes of wedge resection are again noted in the lateral aspect of the left upper lobe. Again noted is a large peripheral postoperative pneumatocele in this region, which has decreased significantly in size compared to the prior study. A few scattered 2-3 mm micronodules are noted throughout the lungs bilaterally, peribronchovascular in distribution, favored to represent areas of chronic mucoid impaction within terminal bronchioles, similar to prior studies. No larger more suspicious appearing pulmonary nodules or masses are noted. Mild  diffuse bronchial wall thickening, with some scattered areas of cylindrical and mild varicose bronchiectasis, most evident in the lower lobes of the lungs bilaterally. No acute consolidative airspace disease. No pleural effusions. There continues to be a small amount of herniation have the left lower lobe through the fifth intercostal space laterally, at the site of thoracotomy. Trace amount of residual gas in the left serratus musculature, significantly decreased compared to the prior examination.  Upper Abdomen: Atherosclerosis.  Musculoskeletal/Soft Tissues: Postthoracotomy changes in the left ribs. There are no aggressive appearing lytic or blastic lesions noted in the visualized portions of the skeleton.  IMPRESSION: 1. Status post left upper lobe wedge resection, with no findings to suggest local recurrence of disease. Decreasing size of postoperative the mass steel in the left upper lobe, and decreasing herniation through the left fifth intercostal space,  as discussed above. 2. Diffuse bronchial wall thickening with areas of cylindrical and mild varicose bronchiectasis, most evident in the lower lobes of the lungs bilaterally. These findings are similar to recent prior examinations. 3. Atherosclerosis, including left main and 2 vessel coronary artery disease. Assessment for potential risk factor modification, dietary therapy or pharmacologic therapy may be warranted, if clinically indicated. 4. Additional incidental findings, as above.   Electronically Signed   By: Vinnie Langton M.D.   On: 04/13/2014 13:24    Recent Lab Findings: Lab Results  Component Value Date   WBC 4.2 10/03/2016   HGB 12.0 10/03/2016   HCT 35.5 10/03/2016   PLT 159 10/03/2016   GLUCOSE 109 (H) 01/22/2017   ALT 6 10/03/2016   AST 25 10/03/2016   NA 141 01/22/2017   K 4.0 01/22/2017   CL 105 01/22/2017   CREATININE 0.58 01/22/2017   BUN 17 01/22/2017   CO2 30 01/22/2017   TSH 0.956 10/03/2016   INR 1.0 10/03/2016    Wt Readings from Last 3 Encounters:  01/21/18 167 lb 3.2 oz (75.8 kg)  08/14/17 171 lb 6.4 oz (77.7 kg)  07/13/17 176 lb (79.8 kg)     Assessment / Plan:   Follow-up CT scan of the chest shows no evidence of recurrence in the area of previous resection, she does have small 2-1/2 mm pulmonary nodules.  Increasing bud  and tree bronchiectasis lower lobes that may be more pronounced.  There is indeterminate lesion in the medial aspect of the right lower lobe along the spine that is marginally enlarged been been present for 4 to 5 years.  At this point contrary recommendation by radiology I do not think a PET scan will particularly help [place] at this point, will obtain a follow-up CT scan in 6 months  [name] MD      [address] [place] [address] Office [phone]   Beeper [phone]  01/21/2018 1:40 PM

## 2018-01-26 ENCOUNTER — Telehealth: Payer: Self-pay | Admitting: *Deleted

## 2018-01-26 ENCOUNTER — Other Ambulatory Visit: Payer: Self-pay | Admitting: *Deleted

## 2018-01-26 MED ORDER — FLUTICASONE FUROATE-VILANTEROL 200-25 MCG/INH IN AEPB: 1.0000 | INHALATION_SPRAY | Freq: Every day | RESPIRATORY_TRACT | 5 refills | Status: DC

## 2018-01-26 MED ORDER — TIOTROPIUM BROMIDE MONOHYDRATE 2.5 MCG/ACT IN AERS: 2.0000 | INHALATION_SPRAY | Freq: Every day | RESPIRATORY_TRACT | 5 refills | Status: DC

## 2018-01-26 NOTE — Telephone Encounter (Signed)
Will her insurance company allow for Breo 200 and Incruse inhaler?

## 2018-01-26 NOTE — Telephone Encounter (Signed)
Patient called stating she wants to let Dr Neldon Mc know the insurance company denied her use of trelegy inhaler. Patient states Dr Neldon Mc gave her samples and she used them. Patient states her insurance company stated they would accept advir. Please call patient [phone]

## 2018-01-26 NOTE — Telephone Encounter (Signed)
Incruse is not covered, Breo 200 is preferred.

## 2018-01-26 NOTE — Telephone Encounter (Signed)
Prescriptions have been sent in to the [facility] on [address]. Called patient and discussed medication change and how to use new inhalers. Patient stated that she was using the Trelegy for 14 days to see how it worked for her and it just happens to not be covered by insurance. Advised patient to try Spiriva and Breo for 14 days and if they work to continue using them, advised that if they are not working for her to please call the office and inform. Patient verbalized understanding.

## 2018-01-26 NOTE — Telephone Encounter (Signed)
Dr. Neldon Mc please advise if the patient can switch to Advair or to continue providing samples of Trelegy.

## 2018-01-26 NOTE — Telephone Encounter (Signed)
How about Breo 200 - one inhalation daily and Spiriva 2.5 Respimat - 2 inhaltions daily?

## 2018-02-01 NOTE — Progress Notes (Signed)
HPI: FU CM. Pt withH/O dyspnea; also with h/o lung ca, COPD and OSA. Echocardiogram in December of 2012 showed an ejection fraction of 97-67%, grade 1 diastolic dysfunction and mild left atrial enlargement. Cardiac catheterization October 2018 showed mild nonobstructive coronary disease and normal filling pressures. Echocardiogram repeated March 2019 and showed ejection fraction 30-35% and mild left atrial enlargement. Declined ICD previously. Chest CT 1/20 showed increase in size of subpleural density and PET scan recommended; 3 mm RUL nodule and fu recommended Since last seenthe patient has dyspnea with more extreme activities but not with routine activities. It is relieved with rest. It is not associated with chest pain. There is no orthopnea, PND or pedal edema. There is no syncope or palpitations. There is no exertional chest pain.   Current Outpatient Medications  Medication Sig Dispense Refill  . albuterol (PROVENTIL HFA;VENTOLIN HFA) 108 (90 Base) MCG/ACT inhaler INHALE 2 PUFFS BY MOUTH IN EACH NOSTRIL EVERY 4 TO 6 HOURS AS NEEDED (Patient taking differently: Inhale into the lungs every 4 (four) hours as needed. INHALE 2 PUFFS BY MOUTH  EVERY 4 TO 6 HOURS AS NEEDED) 93.5 g 0  . atorvastatin (LIPITOR) 20 MG tablet TAKE 1 TABLET(20 MG) BY MOUTH DAILY 90 tablet 3  . BIOTIN PO Take by mouth. MOUTH RINSE FOR DRY MOUTH    . candesartan (ATACAND) 4 MG tablet Take 1 tablet (4 mg total) by mouth daily. 30 tablet 12  . colestipol (COLESTID) 1 g tablet 1 tablet by mouth 1-2 times daily as needed for diarrhea 60 tablet 5  . denosumab (PROLIA) 60 MG/ML SOLN injection Inject 60 mg into the skin every 6 (six) months. Administer in upper arm, thigh, or abdomen    . famotidine (PEPCID) 40 MG tablet Take 1 tablet (40 mg total) by mouth every evening. 30 tablet 5  . fluticasone (FLONASE) 50 MCG/ACT nasal spray Use 1-2 sprays per nostril daily 16 g 5  . fluticasone furoate-vilanterol (BREO ELLIPTA)  200-25 MCG/INH AEPB Inhale 1 puff into the lungs daily. 60 each 5  . lansoprazole (PREVACID) 30 MG capsule Take 1 capsule daily in the morning 30 capsule 5  . leflunomide (ARAVA) 20 MG tablet Take 20 mg by mouth daily.    . metoprolol succinate (TOPROL-XL) 50 MG 24 hr tablet Take 1 tablet (50 mg total) by mouth daily. 90 tablet 3  . Tiotropium Bromide Monohydrate (SPIRIVA RESPIMAT) 2.5 MCG/ACT AERS Inhale 2 puffs into the lungs daily. 1 Inhaler 5  . Venlafaxine HCl 75 MG TB24 Take 75 mg by mouth daily.     . Fluticasone-Umeclidin-Vilant (TRELEGY ELLIPTA) 100-62.5-25 MCG/INH AEPB Inhale 1 puff into the lungs daily. (Patient not taking: Reported on 02/10/2018) 60 each 2  . omeprazole (PRILOSEC) 20 MG capsule Take 20 mg by mouth daily.     No current facility-administered medications for this visit.      Past Medical History:  Diagnosis Date  . Abnormal CT scan   . Allergic rhinitis   . Anemia 2015   after lung surgery  . Angio-edema   . Complication of anesthesia    Fentanyl allergy, "claustrophobia"  . COPD (chronic obstructive pulmonary disease) ([place])   . Depression   . Diaphoresis 07/30/2015   Excessive sweating with little exertion  . Difficult intravenous access   . GERD (gastroesophageal reflux disease)    In the past  . Hyperlipidemia   . Hypertension   . Left bundle branch block   . Lung  cancer ([place]) 2015   left upper lobe wedge removed-no further tx.  . Neuropathy    Tingling in arms, fingers, and feet (Since 06/15/16)  . Obesity   . OSA (obstructive sleep apnea)    denies.  . Osteoarthritis    ra also  . Osteoporosis   . Recurrent upper respiratory infection (URI)   . Rheumatoid arthritis(714.0)    Dr. Stark Jock follows  . SOB (shortness of breath) on exertion   . Uterine cancer ([place]) 1971   tx surgical    Past Surgical History:  Procedure Laterality Date  . ABDOMINAL HYSTERECTOMY     in situ carcinoma partial  . CARDIAC CATHETERIZATION  01/27/11   minor  non-obs CAD, NL EF, mild pulm HTN  . CATARACT EXTRACTION, BILATERAL     last done 12'16-  . COLONOSCOPY W/ BIOPSIES AND POLYPECTOMY    . COLONOSCOPY WITH PROPOFOL N/A 02/13/2015   Procedure: COLONOSCOPY WITH PROPOFOL;  Surgeon: Laurence Spates, MD;  Location: [facility];  Service: Endoscopy;  Laterality: N/A;  . ESOPHAGOGASTRODUODENOSCOPY (EGD) WITH PROPOFOL N/A 09/30/2016   Procedure: ESOPHAGOGASTRODUODENOSCOPY (EGD) WITH PROPOFOL;  Surgeon: Laurence Spates, MD;  Location: [facility];  Service: Endoscopy;  Laterality: N/A;  . LYMPH NODE DISSECTION Left 04/12/2013   Procedure: LYMPH NODE DISSECTION;  Surgeon: [name], MD;  Location: [facility];  Service: Thoracic;  Laterality: Left;  . RIGHT/LEFT HEART CATH AND CORONARY ANGIOGRAPHY N/A 10/08/2016   Procedure: RIGHT/LEFT HEART CATH AND CORONARY ANGIOGRAPHY;  Surgeon: Burnell Blanks, MD;  Location: [facility];  Service: Cardiovascular;  Laterality: N/A;  . TONSILLECTOMY    . VESICOVAGINAL FISTULA CLOSURE W/ TAH  1971  . VIDEO ASSISTED THORACOSCOPY (VATS)/THOROCOTOMY Left 04/12/2013   Procedure: VIDEO ASSISTED THORACOSCOPY (VATS)/THOROCOTOMY, WITH LEFT UPPER LOBE WEDGE RESECTION, CHEST WALL BIOPSY ;  Surgeon: [name], MD;  Location: [facility];  Service: Thoracic;  Laterality: Left;  Marland Kitchen VIDEO BRONCHOSCOPY N/A 04/12/2013   Procedure: VIDEO BRONCHOSCOPY;  Surgeon: [name], MD;  Location: [facility] OR;  Service: Thoracic;  Laterality: N/A;    Social History   Socioeconomic History  . Marital status: Widowed    Spouse name: Not on file  . Number of children: 2  . Years of education: Not on file  . Highest education level: Not on file  Occupational History  . Occupation: Nurse, adult: [employer]  Social Needs  . Financial resource strain: Not on file  . Food insecurity:    Worry: Not on file    Inability: Not on file  . Transportation needs:    Medical: Not on file    Non-medical: Not on file  Tobacco Use  .  Smoking status: Former Smoker    Packs/day: 1.00    Years: 35.00    Pack years: 35.00    Types: Cigarettes    Last attempt to quit: 01/07/1988    Years since quitting: 30.1  . Smokeless tobacco: Never Used  Substance and Sexual Activity  . Alcohol use: No    Comment: not since 1988  . Drug use: No  . Sexual activity: Never  Lifestyle  . Physical activity:    Days per week: Not on file    Minutes per session: Not on file  . Stress: Not on file  Relationships  . Social connections:    Talks on phone: Not on file    Gets together: Not on file    Attends religious service: Not on file  Active member of club or organization: Not on file    Attends meetings of clubs or organizations: Not on file    Relationship status: Not on file  . Intimate partner violence:    Fear of current or ex partner: Not on file    Emotionally abused: Not on file    Physically abused: Not on file    Forced sexual activity: Not on file  Other Topics Concern  . Not on file  Social History Narrative  . Not on file    Family History  Problem Relation Age of Onset  . Lung cancer Mother   . Coronary artery disease Son   . Heart attack Son 89  . Arthritis/Rheumatoid Maternal Grandmother   . Coronary artery disease Son   . Arthritis/Rheumatoid Son     ROS: no fevers or chills, productive cough, hemoptysis, dysphasia, odynophagia, melena, hematochezia, dysuria, hematuria, rash, seizure activity, orthopnea, PND, pedal edema, claudication. Remaining systems are negative.  Physical Exam: Well-developed well-nourished in no acute distress.  Skin is warm and dry.  HEENT is normal.  Neck is supple.  Chest with diminished BS Cardiovascular exam is regular rate and rhythm.  Abdominal exam nontender or distended. No masses palpated. Extremities show no edema. neuro grossly intact   A/P  1 nonischemic cardiomyopathy-patient appears to be euvolemic on examination today.  Continue medical therapy with  beta-blockade and ARB.  She declined Entresto previously and also declined ICD.  2 hypertension-patient's blood pressure is controlled.  Continue present medications and follow.  3 hyperlipidemia-continue statin.  4 dyspnea-this is longstanding and felt to be multifactorial including history of COPD, prior lung cancer with resection, obstructive sleep apnea and obesity hypoventilation syndrome.  As outlined in previous notes her filling pressures were normal at time of previous catheterization.  5 history of nonobstructive coronary disease-continue aspirin and statin.  6 lung nodules-followed by pulmonary and cardiothoracic surgery.  Kirk Ruths, MD

## 2018-02-10 ENCOUNTER — Ambulatory Visit: Payer: PPO | Admitting: Cardiology

## 2018-02-10 ENCOUNTER — Encounter: Payer: Self-pay | Admitting: Cardiology

## 2018-02-10 VITALS — BP 132/74 | HR 68 | Ht 62.0 in | Wt 164.0 lb

## 2018-02-10 DIAGNOSIS — I428 Other cardiomyopathies: Secondary | ICD-10-CM | POA: Diagnosis not present

## 2018-02-10 DIAGNOSIS — I10 Essential (primary) hypertension: Secondary | ICD-10-CM | POA: Diagnosis not present

## 2018-02-10 DIAGNOSIS — I2510 Atherosclerotic heart disease of native coronary artery without angina pectoris: Secondary | ICD-10-CM

## 2018-02-10 NOTE — Patient Instructions (Signed)
Medication Instructions:  NO CHANGE If you need a refill on your cardiac medications before your next appointment, please call your pharmacy.   Lab work: If you have labs (blood work) drawn today and your tests are completely normal, you will receive your results only by: Marland Kitchen MyChart Message (if you have MyChart) OR . A paper copy in the mail If you have any lab test that is abnormal or we need to change your treatment, we will call you to review the results.  Follow-Up: At [facility], you and your health needs are our priority.  As part of our continuing mission to provide you with exceptional heart care, we have created designated Provider Care Teams.  These Care Teams include your primary Cardiologist (physician) and Advanced Practice Providers (APPs -  Physician Assistants and Nurse Practitioners) who all work together to provide you with the care you need, when you need it. You will need a follow up appointment in 6 months.  Please call our office 2 months in advance to schedule this appointment.  You may see Kirk Ruths MD or one of the following Advanced Practice Providers on your designated Care Team:   Kerin Ransom, PA-C Roby Lofts, [place] . Sande Rives, PA-C  CALL IN June TO SCHEDULE APPOINTMENT IN [place]

## 2018-03-08 DIAGNOSIS — J449 Chronic obstructive pulmonary disease, unspecified: Secondary | ICD-10-CM | POA: Diagnosis not present

## 2018-03-08 DIAGNOSIS — J069 Acute upper respiratory infection, unspecified: Secondary | ICD-10-CM | POA: Diagnosis not present

## 2018-03-15 ENCOUNTER — Other Ambulatory Visit: Payer: Self-pay

## 2018-03-15 NOTE — Patient Outreach (Signed)
  [facility]) Care Management Chronic Special Needs Program  03/15/2018  Name: Taylor Berry DOB: 1940/10/07  MRN: [id number]  Ms. Taylor Berry is enrolled in a chronic special needs plan for Heart Failure. Chronic Care Management Coordinator telephoned client to review health risk assessment and to develop individualized care plan.  Introduced the chronic care management program, importance of client participation, and taking their care plan to all provider appointments and inpatient facilities.  Reviewed the transition of care process and possible referral to community care management.  Subjective: client reports history of heart disease, HTN, COPD and Lung cancer that was removed.   Per Health Risk Assessment, client marked overwhelmed with heart disease. Client denies being overwhelmed, but states she gets irritated when everyone ask her to state or write down her medications. She states they are all at [facility]. She reports she is on many medications and see so many providers that she gets tired of repeating them each time she sees a provider.  Client reports history of heart failure. She states she does not weigh herself. She states does not have scales and is not going to buy any. She states she goes to the doctor office appointments enough and is weighed often. RNCM reinforced role of daily weights in managing heart failure and discussed signs/symptoms of exacerbation.  Goals Addressed            This Visit's Progress   . Client understands the importance of follow-up with providers by attending scheduled visits      . Client will report no worsening of symptoms related to heart disease within the next 6-9 months       . Client will verbalize knowledge of chronic lung disease as evidenced by no ED visits or Inpatient stays related to chronic lung disease       . Client will verbalize knowledge of self management of Hypertension as evidences by BP reading of 140/90 or  less; or as defined by provider      . Maintain timely refills of Heart Failure medication as prescribed within the year       . Obtain annual  Lipid Profile, LDL-C      . Visit Primary Care Provider or Cardiologist at least 2 times per year         Plan:  Send successful outreach letter with a copy of their individualized care plan, Send individual care plan to provider and Send educational material Chronic care management coordination will outreach in 3 Months. Will refer client to:  Pharmacy   Kathyrn Sheriff, RN, MSN, [facility] Chronic Care Management Coordinator [facility] [phone]

## 2018-03-16 ENCOUNTER — Encounter: Payer: Self-pay | Admitting: Allergy and Immunology

## 2018-03-16 ENCOUNTER — Ambulatory Visit (INDEPENDENT_AMBULATORY_CARE_PROVIDER_SITE_OTHER): Payer: HMO | Admitting: Allergy and Immunology

## 2018-03-16 ENCOUNTER — Telehealth: Payer: Self-pay | Admitting: Pharmacist

## 2018-03-16 VITALS — BP 128/80 | HR 80 | Temp 97.8°F | Resp 16

## 2018-03-16 DIAGNOSIS — J3089 Other allergic rhinitis: Secondary | ICD-10-CM

## 2018-03-16 DIAGNOSIS — J4489 Other specified chronic obstructive pulmonary disease: Secondary | ICD-10-CM

## 2018-03-16 DIAGNOSIS — K219 Gastro-esophageal reflux disease without esophagitis: Secondary | ICD-10-CM

## 2018-03-16 DIAGNOSIS — J0140 Acute pansinusitis, unspecified: Secondary | ICD-10-CM | POA: Diagnosis not present

## 2018-03-16 DIAGNOSIS — J449 Chronic obstructive pulmonary disease, unspecified: Secondary | ICD-10-CM | POA: Diagnosis not present

## 2018-03-16 MED ORDER — FLUTICASONE FUROATE-VILANTEROL 200-25 MCG/INH IN AEPB: 1.0000 | INHALATION_SPRAY | Freq: Every day | RESPIRATORY_TRACT | 5 refills | Status: DC

## 2018-03-16 MED ORDER — CEFUROXIME AXETIL 250 MG PO TABS: 250.0000 mg | ORAL_TABLET | Freq: Two times a day (BID) | ORAL | 0 refills | Status: AC

## 2018-03-16 MED ORDER — PREDNISONE 10 MG PO TABS: 10.0000 mg | ORAL_TABLET | Freq: Every day | ORAL | 0 refills | Status: AC

## 2018-03-16 NOTE — Patient Instructions (Addendum)
  1.  Continue to treat reflux and LPR with the following combination:   A. Prevacid 30mg  1 time per day in AM  B. Famotidine 40mg  1 time per day in PM  2.  Continue Colestipol 1 g tablet 1-2 times a day for diarrhea  3.  Continue flonase -1-2 sprays each nostril 1 time per day  4.  Restart Breo 200-1 inhalation 1 time per day (samples)  5.  Restart Spiriva 2.5 Respimat 1 inhalation 1 time per day (samples)  6.  For this recent episode utilize the following:   A.  Ceftin 250 mg 1 tablet twice a day for 10 days  B.  Prednisone 10 mg 1 tablet once a day for 10 days  6.  Can use pro-air HFA 2 inhalations every 4-6 hours.  7.  Return to clinic in 12 weeks or earlier if problem  8.  Revisit with pulmonologist concerning lung nodules

## 2018-03-16 NOTE — Progress Notes (Signed)
[place]   Follow-up Note  Referring Provider: Kelton Pillar, MD Primary Provider: Kelton Pillar, MD Date of Office Visit: 03/16/2018  Subjective:   Taylor Berry (DOB: [birth year]) is a 78 y.o. female who returns to the [facility] on 03/16/2018 in re-evaluation of the following:  HPI: Taylor Berry returns to this clinic in reevaluation of COPD with asthma, history of intermittent lip swelling, history of colestipol responsive diarrhea, and LPR.  I have not seen her in this clinic since 12 January 2018.  She was really doing very well with her airway until the end of February at which point in time she had a root canal performed.  Two days after that root canal she developed coughing and nasal congestion.  She has this hacking cough that she can just not clear.  She has had a decreased ability to smell and some ugly postnasal drip.  She apparently received a steroid injection from her primary care doctor on 08 March 2018 which really did not help her very much.  She has not been having any recurrent fevers or headaches or chest pain.  Prior to this point in time she did believe that the administration of Trelegy helped her significantly regarding her dyspnea on exertion and slight chronic cough.  However, her insurance would not cover this medication and she was converted over to [place] but unfortunately because of the double co-pay involved with these 2 canisters she just cannot afford these medications and had to discontinue them a week or 2 ago.  She believes that her throat and her reflux are doing quite well at this point in time while utilizing treatment directed against LPR with the use of a proton pump inhibitor and H2 receptor blocker.  She believes that her chronic diarrhea is under very good control while using colestipol on a consistent basis.  Recently she had a chest CT scan in reevaluation of her lung  cancer and apparently some new lung nodules were identified and she is scheduled to see her pulmonologist next week.  Allergies as of 03/16/2018      Reactions   Latex    REACTION: itching   Ace Inhibitors    REACTION: cough   Fentanyl    Hallucination "I go crazy"    Hydrochlorothiazide    REACTION: decreased sodium   Penicillins Other (See Comments)   REACTION: intolerant due to yeast infection Has patient had a PCN reaction causing immediate rash, facial/tongue/throat swelling, SOB or lightheadedness with hypotension:No Has patient had a PCN reaction causing severe rash involving mucus membranes or skin necrosis: No Has patient had a PCN reaction that required hospitalization No Has patient had a PCN reaction occurring within the last 10 years: Yes If all of the above answers are "NO", then may proceed with Cephalosporin use.      Medication List      Proventil HFA 108 (90 Base) MCG/ACT inhaler Generic drug:  albuterol Inhale 2 puffs into the lungs every 6 (six) hours as needed for wheezing or shortness of breath.   albuterol 108 (90 Base) MCG/ACT inhaler Commonly known as:  PROVENTIL HFA;VENTOLIN HFA INHALE 2 PUFFS BY MOUTH IN EACH NOSTRIL EVERY 4 TO 6 HOURS AS NEEDED   atorvastatin 20 MG tablet Commonly known as:  LIPITOR TAKE 1 TABLET(20 MG) BY MOUTH DAILY   BIOTIN PO Take by mouth. MOUTH RINSE FOR DRY MOUTH   candesartan 4 MG tablet  Commonly known as:  ATACAND Take 1 tablet (4 mg total) by mouth daily.   colestipol 1 g tablet Commonly known as:  COLESTID 1 tablet by mouth 1-2 times daily as needed for diarrhea   denosumab 60 MG/ML Soln injection Commonly known as:  PROLIA Inject 60 mg into the skin every 6 (six) months. Administer in upper arm, thigh, or abdomen   famotidine 40 MG tablet Commonly known as:  PEPCID Take 1 tablet (40 mg total) by mouth every evening.   fluticasone 50 MCG/ACT nasal spray Commonly known as:  FLONASE Use 1-2 sprays per  nostril daily   fluticasone furoate-vilanterol 200-25 MCG/INH Aepb Commonly known as:  Breo Ellipta Inhale 1 puff into the lungs daily.   Fluticasone-Umeclidin-Vilant 100-62.5-25 MCG/INH Aepb Commonly known as:  Trelegy Ellipta Inhale 1 puff into the lungs daily.   lansoprazole 30 MG capsule Commonly known as:  PREVACID Take 1 capsule daily in the morning   leflunomide 20 MG tablet Commonly known as:  ARAVA Take 20 mg by mouth daily.   metoprolol succinate 50 MG 24 hr tablet Commonly known as:  Toprol XL Take 1 tablet (50 mg total) by mouth daily.   Tiotropium Bromide Monohydrate 2.5 MCG/ACT Aers Commonly known as:  Spiriva Respimat Inhale 2 puffs into the lungs daily.   venlafaxine XR 75 MG 24 hr capsule Commonly known as:  EFFEXOR-XR TK 1 C PO QD WF       Past Medical History:  Diagnosis Date  . Abnormal CT scan   . Allergic rhinitis   . Anemia 2015   after lung surgery  . Angio-edema   . Complication of anesthesia    Fentanyl allergy, "claustrophobia"  . COPD (chronic obstructive pulmonary disease) ([place])   . Depression   . Diaphoresis 07/30/2015   Excessive sweating with little exertion  . Difficult intravenous access   . GERD (gastroesophageal reflux disease)    In the past  . Hyperlipidemia   . Hypertension   . Left bundle branch block   . Lung cancer ([place]) 2015   left upper lobe wedge removed-no further tx.  . Neuropathy    Tingling in arms, fingers, and feet (Since 06/15/16)  . Obesity   . OSA (obstructive sleep apnea)    denies.  . Osteoarthritis    ra also  . Osteoporosis   . Recurrent upper respiratory infection (URI)   . Rheumatoid arthritis(714.0)    Dr. Stark Jock follows  . SOB (shortness of breath) on exertion   . Uterine cancer ([place]) 1971   tx surgical    Past Surgical History:  Procedure Laterality Date  . ABDOMINAL HYSTERECTOMY     in situ carcinoma partial  . CARDIAC CATHETERIZATION  01/27/11   minor non-obs CAD, NL EF, mild  pulm HTN  . CATARACT EXTRACTION, BILATERAL     last done 12'16-  . COLONOSCOPY W/ BIOPSIES AND POLYPECTOMY    . COLONOSCOPY WITH PROPOFOL N/A 02/13/2015   Procedure: COLONOSCOPY WITH PROPOFOL;  Surgeon: Laurence Spates, MD;  Location: [facility];  Service: Endoscopy;  Laterality: N/A;  . ESOPHAGOGASTRODUODENOSCOPY (EGD) WITH PROPOFOL N/A 09/30/2016   Procedure: ESOPHAGOGASTRODUODENOSCOPY (EGD) WITH PROPOFOL;  Surgeon: Laurence Spates, MD;  Location: [facility];  Service: Endoscopy;  Laterality: N/A;  . LYMPH NODE DISSECTION Left 04/12/2013   Procedure: LYMPH NODE DISSECTION;  Surgeon: [name], MD;  Location: [facility];  Service: Thoracic;  Laterality: Left;  . RIGHT/LEFT HEART CATH AND CORONARY ANGIOGRAPHY N/A 10/08/2016   Procedure:  RIGHT/LEFT HEART CATH AND CORONARY ANGIOGRAPHY;  Surgeon: Burnell Blanks, MD;  Location: [facility];  Service: Cardiovascular;  Laterality: N/A;  . TONSILLECTOMY    . VESICOVAGINAL FISTULA CLOSURE W/ TAH  1971  . VIDEO ASSISTED THORACOSCOPY (VATS)/THOROCOTOMY Left 04/12/2013   Procedure: VIDEO ASSISTED THORACOSCOPY (VATS)/THOROCOTOMY, WITH LEFT UPPER LOBE WEDGE RESECTION, CHEST WALL BIOPSY ;  Surgeon: [name], MD;  Location: [facility];  Service: Thoracic;  Laterality: Left;  Marland Kitchen VIDEO BRONCHOSCOPY N/A 04/12/2013   Procedure: VIDEO BRONCHOSCOPY;  Surgeon: [name], MD;  Location: [facility];  Service: Thoracic;  Laterality: N/A;    Review of systems negative except as noted in HPI / PMHx or noted below:  Review of Systems  Constitutional: Negative.   HENT: Negative.   Eyes: Negative.   Respiratory: Negative.   Cardiovascular: Negative.   Gastrointestinal: Negative.   Genitourinary: Negative.   Musculoskeletal: Negative.   Skin: Negative.   Neurological: Negative.   Endo/Heme/Allergies: Negative.   Psychiatric/Behavioral: Negative.      Objective:   Vitals:   03/16/18 1137  BP: 128/80  Pulse: 80  Resp: 16  Temp: 97.8 F  (36.6 C)  SpO2: 93%          Physical Exam Constitutional:      Appearance: She is not diaphoretic.  HENT:     Head: Normocephalic.     Right Ear: Tympanic membrane, ear canal and external ear normal.     Left Ear: Tympanic membrane, ear canal and external ear normal.     Nose: Nose normal. No mucosal edema or rhinorrhea.     Mouth/Throat:     Pharynx: Uvula midline. No oropharyngeal exudate.  Eyes:     Conjunctiva/sclera: Conjunctivae normal.  Neck:     Thyroid: No thyromegaly.     Trachea: Trachea normal. No tracheal tenderness or tracheal deviation.  Cardiovascular:     Rate and Rhythm: Normal rate and regular rhythm.     Heart sounds: Normal heart sounds, S1 normal and S2 normal. No murmur.  Pulmonary:     Effort: No respiratory distress.     Breath sounds: Normal breath sounds. No stridor. No wheezing or rales.  Lymphadenopathy:     Head:     Right side of head: No tonsillar adenopathy.     Left side of head: No tonsillar adenopathy.     Cervical: No cervical adenopathy.  Skin:    Findings: No erythema or rash.     Nails: There is no clubbing.   Neurological:     Mental Status: She is alert.     Diagnostics:    Spirometry was performed and demonstrated an FEV1 of 0.59 at 32 % of predicted.  Results of a chest CT scan obtained 21 January 2018 identified the following:  1. Stable postsurgical changes from wedge resection in the posterior left upper lobe, with no findings to suggest local tumor recurrence. 2. Indeterminate subpleural 1.8 x 1.0 cm solid nodular opacity in the medial right lower lobe, which has slowly increased in size over the two prior chest CT studies. Neoplasm can not be excluded. Consider PET-CT for further characterization. 3. Indeterminate new tiny 3 mm solid apical right upper lobe pulmonary nodule, for which follow-up chest CT is recommended in 3-6 months. 4. No additional potential findings of metastatic disease in the chest. 5.  Mild-to-moderate cylindrical and varicoid bronchiectasis in the mid to lower lungs bilaterally with associated tree-in-bud opacities, bronchial wall thickening and scattered parenchymal  banding, most suggestive of chronic infectious bronchiolitis due to atypical mycobacterial infection (MAI). These findings have mildly worsened in the interval.  Assessment and Plan:   1. COPD with asthma ([place])   2. Perennial allergic rhinitis   3. Acute non-recurrent pansinusitis   4. LPRD (laryngopharyngeal reflux disease)     1.  Continue to treat reflux and LPR with the following combination:   A. Prevacid '30mg'$  1 time per day in AM  B. Famotidine '40mg'$  1 time per day in PM  2.  Continue Colestipol 1 g tablet 1-2 times a day for diarrhea  3.  Continue flonase -1-2 sprays each nostril 1 time per day  4.  Restart Breo 200-1 inhalation 1 time per day (samples)  5.  Restart Spiriva 2.5 Respimat 1 inhalation 1 time per day (samples)  6.  For this recent episode utilize the following:   A.  Ceftin 250 mg 1 tablet twice a day for 10 days  B.  Prednisone 10 mg 1 tablet once a day for 10 days  6.  Can use pro-air HFA 2 inhalations every 4-6 hours.  7.  Return to clinic in 12 weeks or earlier if problem  8.  Revisit with pulmonologist concerning lung nodules  I will assume that Alisha has a persistent respiratory tract infection and give her a broad-spectrum antibiotic along with a very low dose of systemic steroids as noted above while she continues to utilize therapy directed against respiratory tract inflammation and reflux and her chronic diarrhea.  She will be visiting with Dr. Lamonte Sakai next Monday in further investigation of her lung nodules and I told Corley at that point she should be significantly improved with that visit as a function of her current medications noted above and if not then she may require further evaluation for persistent infection of her airway.  If she does well I will see her  back in this clinic in the summer 2020 or earlier if there is a problem.  Allena Katz, MD Allergy / Immunology [place]

## 2018-03-16 NOTE — Patient Outreach (Signed)
[facility]) Care Management  03/16/2018  Taylor Berry 1940/06/09 [id number]   Called patient regarding medication assistance. She answered the phone and said she could not talk at the time because she was in the car. She asked me to call her back later.  Plan: Will attempt to call the patient back tomorrow.   [name], PharmD, [place] Clinical Pharmacist [phone]

## 2018-03-17 ENCOUNTER — Other Ambulatory Visit: Payer: Self-pay | Admitting: Pharmacist

## 2018-03-17 ENCOUNTER — Encounter: Payer: Self-pay | Admitting: Allergy and Immunology

## 2018-03-17 NOTE — Patient Outreach (Signed)
[facility]) Care Management  [place]   03/17/2018  Taylor Berry 03/23/1940 [id number]  Reason for referral: Medication Assistance with inhaler  Referral source: [facility] RN Current insurance:[employer]  Outreach:  Successful telephone call with patient.  HIPAA identifiers verified.   Subjective:  Patient was called regarding medication assistance. HIPAA identifiers were obtained.    Objective: Lab Results  Component Value Date   CREATININE 0.58 01/22/2017   CREATININE 0.66 10/03/2016   CREATININE 0.9 07/29/2016    No results found for: HGBA1C  Lipid Panel  No results found for: CHOL, TRIG, HDL, CHOLHDL, VLDL, LDLCALC, LDLDIRECT  BP Readings from Last 3 Encounters:  03/16/18 128/80  02/10/18 132/74  01/21/18 140/70    Allergies  Allergen Reactions  . Latex     REACTION: itching  . Ace Inhibitors     REACTION: cough  . Fentanyl     Hallucination "I go crazy"   . Hydrochlorothiazide     REACTION: decreased sodium  . Penicillins Other (See Comments)    REACTION: intolerant due to yeast infection Has patient had a PCN reaction causing immediate rash, facial/tongue/throat swelling, SOB or lightheadedness with hypotension:No Has patient had a PCN reaction causing severe rash involving mucus membranes or skin necrosis: No Has patient had a PCN reaction that required hospitalization No Has patient had a PCN reaction occurring within the last 10 years: Yes If all of the above answers are "NO", then may proceed with Cephalosporin use.     Medications Reviewed Today    Reviewed by [name], [facility] (Pharmacist) on 03/17/18 at [place] List Status: <None>  Medication Order Taking? Sig Documenting Provider Last Dose Status Informant  albuterol (PROVENTIL HFA;VENTOLIN HFA) 108 (90 Base) MCG/ACT inhaler [id number] Yes INHALE 2 PUFFS BY MOUTH IN EACH NOSTRIL EVERY 4 TO 6 HOURS AS NEEDED  Patient taking differently:  Inhale into the  lungs every 4 (four) hours as needed. INHALE 2 PUFFS BY MOUTH  EVERY 4 TO 6 HOURS AS NEEDED   Kozlow, Donnamarie Poag, MD Taking Active   atorvastatin (LIPITOR) 20 MG tablet [id number] Yes TAKE 1 TABLET(20 MG) BY MOUTH DAILY [name], MD Taking Active   BIOTIN PO [id number] Yes Take by mouth. MOUTH RINSE FOR DRY MOUTH [provider] Taking Active Self  candesartan (ATACAND) 4 MG tablet [id number] Yes Take 1 tablet (4 mg total) by mouth daily. [name], MD Taking Active   cefUROXime (CEFTIN) 250 MG tablet [id number] Yes Take 1 tablet (250 mg total) by mouth 2 (two) times daily with a meal for 10 days. Kozlow, Donnamarie Poag, MD Taking Active   colestipol (COLESTID) 1 g tablet [id number] Yes 1 tablet by mouth 1-2 times daily as needed for diarrhea Kozlow, Donnamarie Poag, MD Taking Active   denosumab (PROLIA) 60 MG/ML SOLN injection [id number] Yes Inject 60 mg into the skin every 6 (six) months. Administer in upper arm, thigh, or abdomen [provider] Taking Active Self  famotidine (PEPCID) 40 MG tablet [id number] Yes Take 1 tablet (40 mg total) by mouth every evening. Kozlow, Donnamarie Poag, MD Taking Active   fluticasone [facility]) 50 MCG/ACT nasal spray [id number] Yes Use 1-2 sprays per nostril daily Kozlow, Donnamarie Poag, MD Taking Active        Patient not taking:       Discontinued 03/17/18 1504 (Change in therapy)   Fluticasone-Umeclidin-Vilant (TRELEGY ELLIPTA) 100-62.5-25 MCG/INH AEPB [id number] Yes Inhale 1 puff into the lungs  daily. Jiles Prows, MD Taking Active   lansoprazole (PREVACID) 30 MG capsule [id number] Yes Take 1 capsule daily in the morning Kozlow, Donnamarie Poag, MD Taking Active   leflunomide (ARAVA) 20 MG tablet [id number] Yes Take 20 mg by mouth daily. [provider] Taking Active Self  metoprolol succinate (TOPROL-XL) 50 MG 24 hr tablet [id number] Yes Take 1 tablet (50 mg total) by mouth daily. [name], MD Taking Active   predniSONE (DELTASONE) 10 MG tablet [id number] Yes  Take 1 tablet (10 mg total) by mouth daily with breakfast for 10 days. Jiles Prows, MD Taking Active        Patient not taking:       Discontinued 03/17/18 1505 (Change in therapy)   venlafaxine XR (EFFEXOR-XR) 75 MG 24 hr capsule [id number] Yes TK 1 C PO QD WF [provider] Taking Active           Assessment:  Drugs sorted by system:  Neurologic/Psychologic: Venlafaxine  Cardiovascular: Atorvastatin, Candesartan, Metoprolol  Pulmonary/Allergy: ProAir HFA, fluticasone, prednisone, Trelegy  Gastrointestinal: Colestipol, Famotidine, Lansoprazole,   Endocrine: Prolia,   Infectious Diseases: Cefuroxime  Miscellaneous: leflunomide   Medication Assistance Findings:  Medication assistance needs identified.   Extra Help:   '[]'  Already receiving Full Extra Help  '[]'  Already receiving Partial Extra Help  '[]'  Eligible based on reported income and assets  '[]'  Not Eligible based on reported income and assets  Patient Assistance Programs: 1) Trelegy made by [employer] o Income requirement met: '[]'  Yes '[x]'  No '[]'  Unknown o Out-of-pocket prescription expenditure met:    '[]'  Yes '[x]'  No  '[]'  Unknown  '[]'  Not applicable - Patient has not met application requirements to apply for this patient assistance program at this time.         [id number]  Patient has been purchasing both Breo and Spiriva and paying $45 for each because she thought there was an issue with the Trelegy billing.  [facility] was called regarding billing of Trelegy.    Additional medication assistance options reviewed with patient as warranted:  No other options identified  Plan: Will follow-up in 2 weeks to be sure she was able to pick up Trelegy and is not having side effects.    [name], PharmD, [place] Clinical Pharmacist [phone]

## 2018-03-22 ENCOUNTER — Other Ambulatory Visit: Payer: Self-pay

## 2018-03-22 ENCOUNTER — Ambulatory Visit (INDEPENDENT_AMBULATORY_CARE_PROVIDER_SITE_OTHER): Payer: HMO | Admitting: Emergency Medicine

## 2018-03-22 ENCOUNTER — Encounter: Payer: Self-pay | Admitting: Emergency Medicine

## 2018-03-22 DIAGNOSIS — R9389 Abnormal findings on diagnostic imaging of other specified body structures: Secondary | ICD-10-CM | POA: Insufficient documentation

## 2018-03-22 DIAGNOSIS — R918 Other nonspecific abnormal finding of lung field: Secondary | ICD-10-CM | POA: Diagnosis not present

## 2018-03-22 DIAGNOSIS — J449 Chronic obstructive pulmonary disease, unspecified: Secondary | ICD-10-CM | POA: Diagnosis not present

## 2018-03-22 NOTE — Patient Instructions (Signed)
We will plan to repeat your CT scan of the chest in July 2020 to compare with your priors. Please go ahead and convert your Breo and Spiriva over to Trelegy 1 inhalation daily this week as planned.  Remember to rinse and gargle after using the Trelegy. Keep your albuterol available to use 2 puffs if needed for shortness of breath, chest tightness, wheezing. Please finish your prednisone prescription as ordered Please finish Ceftin prescription as ordered Continue flonase 2 sprays each nostril daily Follow with Dr Lamonte Sakai in 6 months or sooner if you have any problems

## 2018-03-22 NOTE — Assessment & Plan Note (Signed)
She is some scattered micronodular disease and a medial right lower lobe opacity that has been present for several years but which is probably slowly enlarging. We will perform repeat Ct chest in 6 months assess for interval change, discuss options w Dr Servando Snare. She may be a candidate for ENB if it is changing. Would probably check a PET first.

## 2018-03-22 NOTE — Assessment & Plan Note (Signed)
With a recent acute exacerbation and bronchitis that is been treated with prednisone and now Ceftin.  She is benefiting since she got on the antibiotic.  She is about to change from Breo/Spiriva over to Trelegy, this was delayed due to cost.  I will follow-up with her to assess her response to the new regimen.  Please go ahead and convert your Breo and Spiriva over to Trelegy 1 inhalation daily this week as planned.  Remember to rinse and gargle after using the Trelegy. Keep your albuterol available to use 2 puffs if needed for shortness of breath, chest tightness, wheezing. Please finish your prednisone prescription as ordered Please finish Ceftin prescription as ordered Continue flonase 2 sprays each nostril daily Follow with Dr Lamonte Sakai in 6 months or sooner if you have any problems

## 2018-03-22 NOTE — Progress Notes (Signed)
HPI: ROV 02/05/17 --78 year old woman with a history of obesity, uterine cancer, squamous cell lung cancer status post left upper lobe wedge resection, rheumatoid arthritis, untreated sleep apnea, hypertension and coronary disease.  We follow her for COPD. I ordered a CT chest 01/22/17 given a question on CT abd of a new pulmonary nodule. I have reviewed - the CT chest is stable. She underwent PFT today that I have reviewed. She has severe obstruction, borderline BD response, normal volumes, decreased DLCO that corrects to normal range for alveolar volume.   ROV 03/22/2018 --this is a follow-up visit for patient with a history of RA, CAD, untreated OSA, uterine cancer, squamous cell lung cancer with a history of left upper lobe wedge resection.  We follow her for this and also for COPD, severe obstructive disease.    She had a repeat CT scan of her chest done on 01/21/2018 that showed stable postsurgical changes, and indeterminate subpleural 1.8 x 1.0 cm right lower lobe nodular opacity, slightly enlarged compared with 1 year ago, has been present for 4 yrs.  She also has some mild to moderate bronchiectatic change with some associated tree-in-bud opacity.   She was never able to start Trelegy until last week, was using Breo and Spiriva instead. She was then able to get Trelegy with assistance of case management, albuterol as needed, Flonase.  She experienced some increased dyspnea and cough beginning late February, was treated with prednisone, started mucinex. Then seen by Dr Neldon Mc 3/10, received ceftin, an extension of the prednisone. She is clinically improving on the ceftin.    EXAM:  Vitals:   03/22/18 1113  BP: (!) 150/100  Pulse: 61  SpO2: 92%  Weight: 163 lb (73.9 kg)   Gen: Pleasant, obese, in no distress,  normal affect, in wheelchair   ENT: No lesions,  mouth clear,  oropharynx clear, no postnasal drip  Neck: No JVD, no TMG, no carotid bruits  Lungs: No use of accessory muscles,  clear bilaterally, no wheeze.   Cardiovascular: RRR, heart sounds normal, no murmur or gallops, no peripheral edema  Musculoskeletal: No deformities, no cyanosis or clubbing  Neuro: alert, non focal  Skin: Warm, no lesions or rashes    Ct chest 01/22/17 --  COMPARISON:  04/17/2016.  FINDINGS: Cardiovascular: The heart size is normal. No pericardial effusion. Coronary artery calcification is evident. Atherosclerotic calcification is noted in the wall of the thoracic aorta. Pulmonary scratches that prominence of the main pulmonary arteries (3.0 cm diameter on the right and 3.2 cm diameter on the left) suggests pulmonary arterial hypertension.  Mediastinum/Nodes: No mediastinal lymphadenopathy. There is no hilar lymphadenopathy. The esophagus has normal imaging features. There is no axillary lymphadenopathy.  Lungs/Pleura: Biapical pleural-parenchymal scarring again noted. 3 mm left apical nodule (image 23 series 3) is unchanged. 3 mm perifissural nodule in the left lung seen on image 52 today is stable. Surgical scarring in the posterior left upper lobe is stable. Chronic atelectasis or scarring in the right middle lobe is unchanged in the interval.  No focal airspace consolidation. No pulmonary edema or pleural effusion. Circumferential bronchial wall thickening with changes of emphysema again noted.  Upper Abdomen: Small hypoattenuating lesion in the dome of the left liver is stable and compatible with a cyst. 1.4 x 1.6 cm left adrenal nodule was largely obscured by motion artifact on the previous study and less well demonstrated on earlier studies due to the thicker slice collimation used on those prior exams. This is unchanged in the  interval and also comparing back to 04/13/2014. Imaging features most consistent with benign adrenal adenoma. Diverticular changes are noted in the abdominal segments of the colon. Possible tiny calcified  gallstones.  Musculoskeletal: Bone windows reveal no worrisome lytic or sclerotic osseous lesions.  IMPRESSION: 1. Stable exam.  No new or progressive findings. 2. Stable appearance of surgical scarring posterior left upper lobe. 3. Prominence of the main pulmonary arteries raise the question of pulmonary arterial hypertension. 4.  Aortic Atherosclerois (ICD10-170.0) 5.  Emphysema. (VGJ15-N53.9)   Abnormal CT of the chest She is some scattered micronodular disease and a medial right lower lobe opacity that has been present for several years but which is probably slowly enlarging. We will perform repeat Ct chest in 6 months assess for interval change, discuss options w Dr Servando Snare. She may be a candidate for ENB if it is changing. Would probably check a PET first.   COPD (chronic obstructive pulmonary disease) With a recent acute exacerbation and bronchitis that is been treated with prednisone and now Ceftin.  She is benefiting since she got on the antibiotic.  She is about to change from Breo/Spiriva over to Trelegy, this was delayed due to cost.  I will follow-up with her to assess her response to the new regimen.  Please go ahead and convert your Breo and Spiriva over to Trelegy 1 inhalation daily this week as planned.  Remember to rinse and gargle after using the Trelegy. Keep your albuterol available to use 2 puffs if needed for shortness of breath, chest tightness, wheezing. Please finish your prednisone prescription as ordered Please finish Ceftin prescription as ordered Continue flonase 2 sprays each nostril daily Follow with Dr Lamonte Sakai in 6 months or sooner if you have any problems  Baltazar Apo, MD, PhD 03/22/2018, 11:51 AM [facility] [phone] or if no answer [phone]

## 2018-03-29 ENCOUNTER — Other Ambulatory Visit: Payer: Self-pay

## 2018-03-29 NOTE — Patient Outreach (Signed)
  [facility]) Care Management Chronic Special Needs Program    03/29/2018  Name: Taylor Berry, DOB: Nov 09, 1940  MRN: [id number]   Ms. Taylor Berry is enrolled in a chronic special needs plan for Heart Failure.Client called with questions regarding [facility] consent form-questions answered. Client also is requesting assistance with getting a scale. Client reports she is unable to afford scale.  Plan: assist client with obtaining scales.  Thea Silversmith, RN, MSN, [place] [phone]

## 2018-03-31 ENCOUNTER — Other Ambulatory Visit: Payer: Self-pay | Admitting: Pharmacist

## 2018-03-31 ENCOUNTER — Ambulatory Visit: Payer: Self-pay | Admitting: Pharmacist

## 2018-03-31 NOTE — Patient Outreach (Signed)
[place] [facility]) Care Management  03/31/2018  Taylor Berry 1940-06-24 [id number]   Patient was called to follow up on picking up Trelegy. HIPAA identifiers were obtained. Patient said she is not having any side effects from Trelegy. She also reported that she has completed isolated herself from others due to COVID-19 safety recommendations.  Patient reported she has everything she needs except toiler paper.   She said she is not leaving her home at all but she does have a granddaughter who can run errands for her.   Plan: Call patient back   [name], PharmD, [place] Clinical Pharmacist [phone]

## 2018-04-14 ENCOUNTER — Other Ambulatory Visit: Payer: Self-pay

## 2018-04-14 NOTE — Patient Outreach (Signed)
  [place] [facility]) Care Management Chronic Special Needs Program  04/14/2018  Name: [name] DOB: [birth year]  MRN: [id number]  Ms. Taylor Berry is enrolled in a chronic special needs plan for Heart Failure. RNCM reviewed and updated care plan. Discussed COVID19 cause, symptoms, precautions (social distancing, stay at home order, hand washing), confirmed client knows how to contact provider.   Subjective: My doctors have ordered me to stay at home and that's it so I have been staying at home. Client reports the toilet paper is "Getting down to the nitty gritty". She reports she has some friends that will get her some tissue the next time they go to the store. She states she received the scales and is weighing daily.  Goals Addressed            This Visit's Progress   . Client understands the importance of follow-up with providers by attending scheduled visits   On track   . Client verbalize knowledge of Heart Failure diease self management skills within the next 6-9 months.   On track   . Client will report no worsening of symptoms related to heart disease within the next 6-9 months    On track   . Client will report weighing and recording weights daily within the next 3 months.   On track    To get the most accurate weights: weigh in the morning; after going to the bathroom and before you eat, In the same type of clothing.    . Client will verbalize knowledge of chronic lung disease as evidenced by no ED visits or Inpatient stays related to chronic lung disease    On track   . Client will verbalize knowledge of self management of Hypertension as evidences by BP reading of 140/90 or less; or as defined by provider   On track   . Maintain timely refills of Heart Failure medication as prescribed within the year    On track   . Obtain annual  Lipid Profile, LDL-C   On track   . Visit Primary Care Provider or Cardiologist at least 2 times per year   On track     Client  encouraged to call as needed. Client acknowledged that she has RNCM's contact number.  Plan: continue to follow. Chronic care management coordinator will outreach in 3 months.   Thea Silversmith, RN, MSN, [place] [phone]  .

## 2018-04-22 ENCOUNTER — Other Ambulatory Visit: Payer: Self-pay | Admitting: Cardiology

## 2018-04-22 NOTE — Telephone Encounter (Signed)
Metoprolol succ 50 mg refilled.

## 2018-04-27 ENCOUNTER — Other Ambulatory Visit: Payer: Self-pay | Admitting: Pharmacist

## 2018-04-27 NOTE — Patient Outreach (Addendum)
[place] [facility]) Care Management  04/27/2018  Taylor Berry 1940/02/19 [id number]   Patient was called to follow up. HIPAA identifiers were obtained. Patient confirmed she is doing well with Trelegy. In addition, she said she ordered her groceries online and is scheduled to pick them up curbside this week.  She wondered about her son being able to use his EBT Card for online pick up of groceries. She said he had a bad experience at [employer] and [employer] because both stores said he would have to come in to have his cards processed.  I did an [employer] manager and spoke with [facility] Social Worker, [employer] and discovered [facility] will take the EBT card for online shopping. They complete the transaction at the car.    I called the [employer] closest to the patient's home to be sure the information online was correct. The representative confirmed they would take the EBT card at the car but recommend people bring another credit card or debit card in case they ordered things that are not covered by the EBT card.  Called patient back and let her know. She was very Patent attorney.  [employer] [phone] needs $600 to qualify for Trelegy from [place].  Plan: Call patient back in 3 months    Taylor Berry, PharmD, [place] Clinical Pharmacist [phone]

## 2018-05-03 DIAGNOSIS — Z85828 Personal history of other malignant neoplasm of skin: Secondary | ICD-10-CM | POA: Diagnosis not present

## 2018-05-03 DIAGNOSIS — D1801 Hemangioma of skin and subcutaneous tissue: Secondary | ICD-10-CM | POA: Diagnosis not present

## 2018-05-03 DIAGNOSIS — L738 Other specified follicular disorders: Secondary | ICD-10-CM | POA: Diagnosis not present

## 2018-05-03 DIAGNOSIS — L570 Actinic keratosis: Secondary | ICD-10-CM | POA: Diagnosis not present

## 2018-05-03 DIAGNOSIS — L821 Other seborrheic keratosis: Secondary | ICD-10-CM | POA: Diagnosis not present

## 2018-05-26 ENCOUNTER — Other Ambulatory Visit: Payer: Self-pay | Admitting: Cardiology

## 2018-06-08 ENCOUNTER — Ambulatory Visit: Payer: HMO

## 2018-06-08 ENCOUNTER — Telehealth: Payer: Self-pay | Admitting: Emergency Medicine

## 2018-06-08 NOTE — Telephone Encounter (Signed)
Yes, since pt already has a CT scheduled by [facility], the order that was placed by Dr. Lamonte Sakai can be cancelled.

## 2018-06-09 NOTE — Telephone Encounter (Signed)
Done

## 2018-06-15 ENCOUNTER — Other Ambulatory Visit: Payer: Self-pay

## 2018-06-15 ENCOUNTER — Encounter: Payer: Self-pay | Admitting: Allergy and Immunology

## 2018-06-15 ENCOUNTER — Ambulatory Visit (INDEPENDENT_AMBULATORY_CARE_PROVIDER_SITE_OTHER): Payer: HMO | Admitting: Allergy and Immunology

## 2018-06-15 VITALS — BP 150/88 | HR 64 | Temp 98.6°F | Resp 16 | Ht 62.0 in | Wt 169.0 lb

## 2018-06-15 DIAGNOSIS — J4489 Other specified chronic obstructive pulmonary disease: Secondary | ICD-10-CM

## 2018-06-15 DIAGNOSIS — J449 Chronic obstructive pulmonary disease, unspecified: Secondary | ICD-10-CM

## 2018-06-15 DIAGNOSIS — R197 Diarrhea, unspecified: Secondary | ICD-10-CM

## 2018-06-15 DIAGNOSIS — K219 Gastro-esophageal reflux disease without esophagitis: Secondary | ICD-10-CM | POA: Diagnosis not present

## 2018-06-15 DIAGNOSIS — J3089 Other allergic rhinitis: Secondary | ICD-10-CM | POA: Diagnosis not present

## 2018-06-15 MED ORDER — FAMOTIDINE 40 MG PO TABS: 40.0000 mg | ORAL_TABLET | Freq: Every evening | ORAL | 5 refills | Status: DC

## 2018-06-15 MED ORDER — FLUTICASONE PROPIONATE 50 MCG/ACT NA SUSP: NASAL | 5 refills | Status: DC

## 2018-06-15 MED ORDER — FLUTICASONE-UMECLIDIN-VILANT 100-62.5-25 MCG/INH IN AEPB: 1.0000 | INHALATION_SPRAY | Freq: Every day | RESPIRATORY_TRACT | 2 refills | Status: DC

## 2018-06-15 MED ORDER — LANSOPRAZOLE 30 MG PO CPDR: DELAYED_RELEASE_CAPSULE | ORAL | 5 refills | Status: DC

## 2018-06-15 MED ORDER — COLESTIPOL HCL 1 G PO TABS: ORAL_TABLET | ORAL | 5 refills | Status: DC

## 2018-06-15 NOTE — Patient Instructions (Addendum)
  1.  Continue to treat reflux and LPR with the following combination:   A. Prevacid 30mg  1 time per day in AM  B. Famotidine 40mg  1 time per day in PM  2.  Continue Colestipol 1 g tablet 1-2 times a day for diarrhea  3.  Continue flonase -1-2 sprays each nostril 1 time per day  4.  Continue Trelegy - 1 inhalation 1 time per day  5.  Can use pro-air HFA 2 inhalations every 4-6 hours.  6.  Return to clinic in 6 months or earlier if problem  7. Obtain fall flu vaccine

## 2018-06-15 NOTE — Progress Notes (Signed)
[place]   Follow-up Note  Referring Provider: Kelton Pillar, MD Primary Provider: Kelton Pillar, MD Date of Office Visit: 06/15/2018  Subjective:   Taylor Berry (DOB: June 12, 1940) is a 78 y.o. female who returns to the [place] on 06/15/2018 in re-evaluation of the following:  HPI: Taylor Berry returns to this clinic in reevaluation of COPD with asthma, allergic rhinitis, history of colestipol responsive diarrhea, LPR, and a distant history of lip swelling.  Her last visit to this clinic was 16 March 2018 at which point in time she was having an airway exacerbation requiring the administration of a systemic steroid and a broad-spectrum antibiotic.  Since her last visit she has really done well.  She rarely uses a short acting bronchodilator.  She walks in the neighborhood without any difficulty.  She has not required a systemic steroid or antibiotic to treat an airway issue.  She fortunately was able to get insurance coverage for her Trelegy and has stopped her Spiriva and Breo.  Her nose is doing very well while using some nasal steroid.  Her reflux and throat issues under very good control at this point in time on her current treatment.  Her diarrhea is responded quite well to the use of colestipol mostly 1 time per day.  She has a follow-up CT scan for her lung nodule/lung cancer history followed by Dr. Lamonte Berry this coming July.  Allergies as of 06/15/2018      Reactions   Latex    REACTION: itching   Ace Inhibitors    REACTION: cough   Fentanyl    Hallucination "I go crazy"    Hydrochlorothiazide    REACTION: decreased sodium   Penicillins Other (See Comments)   REACTION: intolerant due to yeast infection Has patient had a PCN reaction causing immediate rash, facial/tongue/throat swelling, SOB or lightheadedness with hypotension:No Has patient had a PCN reaction causing severe rash involving mucus membranes  or skin necrosis: No Has patient had a PCN reaction that required hospitalization No Has patient had a PCN reaction occurring within the last 10 years: Yes If all of the above answers are "NO", then may proceed with Cephalosporin use.      Medication List      albuterol 108 (90 Base) MCG/ACT inhaler Commonly known as:  VENTOLIN HFA INHALE 2 PUFFS BY MOUTH IN EACH NOSTRIL EVERY 4 TO 6 HOURS AS NEEDED   atorvastatin 20 MG tablet Commonly known as:  LIPITOR TAKE 1 TABLET(20 MG) BY MOUTH DAILY   BIOTIN PO Take by mouth. MOUTH RINSE FOR DRY MOUTH   candesartan 4 MG tablet Commonly known as:  ATACAND TAKE 1 TABLET BY MOUTH DAILY   colestipol 1 g tablet Commonly known as:  COLESTID 1 tablet by mouth 1-2 times daily as needed for diarrhea   denosumab 60 MG/ML Soln injection Commonly known as:  PROLIA Inject 60 mg into the skin every 6 (six) months. Administer in upper arm, thigh, or abdomen   famotidine 40 MG tablet Commonly known as:  PEPCID Take 1 tablet (40 mg total) by mouth every evening.   fluticasone 50 MCG/ACT nasal spray Commonly known as:  FLONASE Use 1-2 sprays per nostril daily   Fluticasone-Umeclidin-Vilant 100-62.5-25 MCG/INH Aepb Commonly known as:  Trelegy Ellipta Inhale 1 puff into the lungs daily.   lansoprazole 30 MG capsule Commonly known as:  PREVACID Take 1 capsule daily in the morning   leflunomide 20 MG  tablet Commonly known as:  ARAVA Take 20 mg by mouth daily.   metoprolol succinate 50 MG 24 hr tablet Commonly known as:  TOPROL-XL TAKE 1 TABLET(50 MG) BY MOUTH DAILY   venlafaxine XR 75 MG 24 hr capsule Commonly known as:  EFFEXOR-XR TK 1 C PO QD WF       Past Medical History:  Diagnosis Date  . Abnormal CT scan   . Allergic rhinitis   . Anemia 2015   after lung surgery  . Angio-edema   . Complication of anesthesia    Fentanyl allergy, "claustrophobia"  . COPD (chronic obstructive pulmonary disease) ([place])   . Depression   .  Diaphoresis 07/30/2015   Excessive sweating with little exertion  . Difficult intravenous access   . GERD (gastroesophageal reflux disease)    In the past  . Hyperlipidemia   . Hypertension   . Left bundle branch block   . Lung cancer ([place]) 2015   left upper lobe wedge removed-no further tx.  . Neuropathy    Tingling in arms, fingers, and feet (Since 06/15/16)  . Obesity   . OSA (obstructive sleep apnea)    denies.  . Osteoarthritis    ra also  . Osteoporosis   . Recurrent upper respiratory infection (URI)   . Rheumatoid arthritis(714.0)    Dr. Stark Jock follows  . SOB (shortness of breath) on exertion   . Uterine cancer ([place]) 1971   tx surgical    Past Surgical History:  Procedure Laterality Date  . ABDOMINAL HYSTERECTOMY     in situ carcinoma partial  . CARDIAC CATHETERIZATION  01/27/11   minor non-obs CAD, NL EF, mild pulm HTN  . CATARACT EXTRACTION, BILATERAL     last done 12'16-  . COLONOSCOPY W/ BIOPSIES AND POLYPECTOMY    . COLONOSCOPY WITH PROPOFOL N/A 02/13/2015   Procedure: COLONOSCOPY WITH PROPOFOL;  Surgeon: Laurence Spates, MD;  Location: [facility];  Service: Endoscopy;  Laterality: N/A;  . ESOPHAGOGASTRODUODENOSCOPY (EGD) WITH PROPOFOL N/A 09/30/2016   Procedure: ESOPHAGOGASTRODUODENOSCOPY (EGD) WITH PROPOFOL;  Surgeon: Laurence Spates, MD;  Location: [facility];  Service: Endoscopy;  Laterality: N/A;  . LYMPH NODE DISSECTION Left 04/12/2013   Procedure: LYMPH NODE DISSECTION;  Surgeon: [name], MD;  Location: [place];  Service: Thoracic;  Laterality: Left;  . RIGHT/LEFT HEART CATH AND CORONARY ANGIOGRAPHY N/A 10/08/2016   Procedure: RIGHT/LEFT HEART CATH AND CORONARY ANGIOGRAPHY;  Surgeon: Burnell Blanks, MD;  Location: [place] [facility];  Service: Cardiovascular;  Laterality: N/A;  . TONSILLECTOMY    . VESICOVAGINAL FISTULA CLOSURE W/ TAH  1971  . VIDEO ASSISTED THORACOSCOPY (VATS)/THOROCOTOMY Left 04/12/2013   Procedure: VIDEO ASSISTED  THORACOSCOPY (VATS)/THOROCOTOMY, WITH LEFT UPPER LOBE WEDGE RESECTION, CHEST WALL BIOPSY ;  Surgeon: [name], MD;  Location: [facility];  Service: Thoracic;  Laterality: Left;  [place] Kitchen VIDEO BRONCHOSCOPY N/A 04/12/2013   Procedure: VIDEO BRONCHOSCOPY;  Surgeon: [name], MD;  Location: [facility] OR;  Service: Thoracic;  Laterality: N/A;    Review of systems negative except as noted in HPI / PMHx or noted below:  Review of Systems  Constitutional: Negative.   HENT: Negative.   Eyes: Negative.   Respiratory: Negative.   Cardiovascular: Negative.   Gastrointestinal: Negative.   Genitourinary: Negative.   Musculoskeletal: Negative.   Skin: Negative.   Neurological: Negative.   Endo/Heme/Allergies: Negative.   Psychiatric/Behavioral: Negative.      Objective:   Vitals:   06/15/18 1001  BP: [place] Kitchen)  150/88  Pulse: 64  Resp: 16  Temp: 98.6 F (37 C)  SpO2: 94%   Height: 5\' 2"  (157.5 cm)  Weight: 169 lb (76.7 kg)   Physical Exam Constitutional:      Appearance: She is not diaphoretic.  HENT:     Head: Normocephalic.     Right Ear: Tympanic membrane, ear canal and external ear normal.     Left Ear: Tympanic membrane, ear canal and external ear normal.     Nose: Nose normal. No mucosal edema or rhinorrhea.     Mouth/Throat:     Pharynx: Uvula midline. No oropharyngeal exudate.  Eyes:     Conjunctiva/sclera: Conjunctivae normal.  Neck:     Thyroid: No thyromegaly.     Trachea: Trachea normal. No tracheal tenderness or tracheal deviation.  Cardiovascular:     Rate and Rhythm: Normal rate and regular rhythm.     Heart sounds: Normal heart sounds, S1 normal and S2 normal. No murmur.  Pulmonary:     Effort: No respiratory distress.     Breath sounds: Normal breath sounds. No stridor. No wheezing or rales.  Lymphadenopathy:     Head:     Right side of head: No tonsillar adenopathy.     Left side of head: No tonsillar adenopathy.     Cervical: No cervical adenopathy.  Skin:     Findings: No erythema or rash.     Nails: There is no clubbing.   Neurological:     Mental Status: She is alert.     Diagnostics:    Spirometry was performed and demonstrated an FEV1 of 0.96 at 52 % of predicted.  Assessment and Plan:   1. COPD with asthma ([place])   2. Perennial allergic rhinitis   3. LPRD (laryngopharyngeal reflux disease)   4. Diarrhea, unspecified type     1.  Continue to treat reflux and LPR with the following combination:   A. Prevacid 30mg  1 time per day in AM  B. Famotidine 40mg  1 time per day in PM  2.  Continue Colestipol 1 g tablet 1-2 times a day for diarrhea  3.  Continue flonase -1-2 sprays each nostril 1 time per day  4.  Continue Trelegy - 1 inhalation 1 time per day  5.  Can use pro-air HFA 2 inhalations every 4-6 hours.  6.  Return to clinic in 6 months or earlier if problem  7. Obtain fall flu vaccine  Alyanna appears to be doing quite well on her current treatment which includes therapy directed against respiratory tract inflammation and reflux and diarrhea.  She will maintain treatment with anti-inflammatory agents for her airway and a proton pump inhibitor and H2 receptor blocker and colestipol and I will see her back in this clinic in 6 months or earlier if there is a problem.  Allena Katz, MD Allergy / Immunology [place]

## 2018-06-16 ENCOUNTER — Encounter: Payer: Self-pay | Admitting: Allergy and Immunology

## 2018-06-16 DIAGNOSIS — I110 Hypertensive heart disease with heart failure: Secondary | ICD-10-CM | POA: Diagnosis not present

## 2018-06-16 DIAGNOSIS — Z1389 Encounter for screening for other disorder: Secondary | ICD-10-CM | POA: Diagnosis not present

## 2018-06-16 DIAGNOSIS — M810 Age-related osteoporosis without current pathological fracture: Secondary | ICD-10-CM | POA: Diagnosis not present

## 2018-06-16 DIAGNOSIS — K219 Gastro-esophageal reflux disease without esophagitis: Secondary | ICD-10-CM | POA: Diagnosis not present

## 2018-06-16 DIAGNOSIS — Z0000 Encounter for general adult medical examination without abnormal findings: Secondary | ICD-10-CM | POA: Diagnosis not present

## 2018-06-16 DIAGNOSIS — Z85118 Personal history of other malignant neoplasm of bronchus and lung: Secondary | ICD-10-CM | POA: Diagnosis not present

## 2018-06-16 DIAGNOSIS — J449 Chronic obstructive pulmonary disease, unspecified: Secondary | ICD-10-CM | POA: Diagnosis not present

## 2018-06-16 DIAGNOSIS — I10 Essential (primary) hypertension: Secondary | ICD-10-CM | POA: Diagnosis not present

## 2018-06-16 DIAGNOSIS — E7800 Pure hypercholesterolemia, unspecified: Secondary | ICD-10-CM | POA: Diagnosis not present

## 2018-06-16 DIAGNOSIS — F39 Unspecified mood [affective] disorder: Secondary | ICD-10-CM | POA: Diagnosis not present

## 2018-06-16 DIAGNOSIS — I5020 Unspecified systolic (congestive) heart failure: Secondary | ICD-10-CM | POA: Diagnosis not present

## 2018-06-16 DIAGNOSIS — J42 Unspecified chronic bronchitis: Secondary | ICD-10-CM | POA: Diagnosis not present

## 2018-06-29 DIAGNOSIS — E663 Overweight: Secondary | ICD-10-CM | POA: Diagnosis not present

## 2018-06-29 DIAGNOSIS — Z79899 Other long term (current) drug therapy: Secondary | ICD-10-CM | POA: Diagnosis not present

## 2018-06-29 DIAGNOSIS — C3490 Malignant neoplasm of unspecified part of unspecified bronchus or lung: Secondary | ICD-10-CM | POA: Diagnosis not present

## 2018-06-29 DIAGNOSIS — Z6829 Body mass index (BMI) 29.0-29.9, adult: Secondary | ICD-10-CM | POA: Diagnosis not present

## 2018-06-29 DIAGNOSIS — E785 Hyperlipidemia, unspecified: Secondary | ICD-10-CM | POA: Diagnosis not present

## 2018-06-29 DIAGNOSIS — R0602 Shortness of breath: Secondary | ICD-10-CM | POA: Diagnosis not present

## 2018-06-29 DIAGNOSIS — M810 Age-related osteoporosis without current pathological fracture: Secondary | ICD-10-CM | POA: Diagnosis not present

## 2018-06-29 DIAGNOSIS — R29898 Other symptoms and signs involving the musculoskeletal system: Secondary | ICD-10-CM | POA: Diagnosis not present

## 2018-06-29 DIAGNOSIS — M150 Primary generalized (osteo)arthritis: Secondary | ICD-10-CM | POA: Diagnosis not present

## 2018-06-29 DIAGNOSIS — M0589 Other rheumatoid arthritis with rheumatoid factor of multiple sites: Secondary | ICD-10-CM | POA: Diagnosis not present

## 2018-06-29 DIAGNOSIS — R232 Flushing: Secondary | ICD-10-CM | POA: Diagnosis not present

## 2018-06-29 DIAGNOSIS — R682 Dry mouth, unspecified: Secondary | ICD-10-CM | POA: Diagnosis not present

## 2018-07-07 ENCOUNTER — Other Ambulatory Visit: Payer: Self-pay

## 2018-07-07 NOTE — Patient Outreach (Signed)
  [facility]) Care Management Chronic Special Needs Program  07/07/2018  Name: Taylor Berry DOB: 1940/12/19  MRN: [id number]  Ms. Miria Cappelli is enrolled in a chronic special needs plan for Heart Failure. Reviewed and updated care plan.  Subjective: client reports she is following up with providers as scheduled. She states she checked and recorded weights her weights for about a month and there was no change so she stopped recording her weights. Client reports she is currently able to get her medications, but states the cost of trelegy has increased and reports she is on a limited budget.   Goals Addressed            This Visit's Progress   . COMPLETED: Client understands the importance of follow-up with providers by attending scheduled visits       Verbalized understanding.    . Client verbalize knowledge of Heart Failure diease self management skills within the next 6-9 months.   On track   . Client will report no worsening of symptoms related to heart disease within the next 6-9 months    On track   . Client will report weighing and recording weights daily within the next 3 months.   On track    To get the most accurate weights: weigh in the morning; after going to the bathroom and before you eat, In the same type of clothing.    . Client will verbalize knowledge of chronic lung disease as evidenced by no ED visits or Inpatient stays related to chronic lung disease    On track   . COMPLETED: Client will verbalize knowledge of self management of Hypertension as evidences by BP reading of 140/90 or less; or as defined by provider       Taking medications as prescribed, self-monitoring of blood pressure, monitoring sodium intake, follows up with providers as scheduled.    . Maintain timely refills of Heart Failure medication as prescribed within the year    On track   . Obtain annual  Lipid Profile, LDL-C   On track   . Visit Primary Care Provider or Cardiologist  at least 2 times per year   On track    Per client, Primary care tele-health visit in June.      RNCM informed client that [facility] pharmacist is scheduled to follow up with her this month. Client acknowledged understanding and states she will discuss with pharmacist   COVID-19 precautions reiterated. RNCM encouraged client to call RNCM as needed. RNCM also encouraged client to call the healthcare concierge for benefits questions as needed.  Plan: Follow up with client in 5 months. RNCM will follow up with [facility] pharmacist to update.    Kathyrn Sheriff, RN, MSN, [facility] Chronic Care Management Coordinator [facility] [phone]        .

## 2018-07-27 ENCOUNTER — Other Ambulatory Visit: Payer: Self-pay | Admitting: Pharmacist

## 2018-07-27 ENCOUNTER — Other Ambulatory Visit: Payer: Self-pay | Admitting: Pharmacy Technician

## 2018-07-27 NOTE — Patient Outreach (Signed)
[facility]) Care Management  07/27/2018  Taylor Berry 05/13/1940 [id number]                           Medication Assistance Referral  Referral From: [place]  Medication/Company: Trelegy and Ventolin HFA / [name] Patient application portion:  Mailed Provider application portion: Faxed  to Dr. Allena Katz    Follow up:  Will follow up with patient in 5-10 business days to confirm application(s) have been received.  [name] P. [name], [name] Management [phone]

## 2018-07-27 NOTE — Patient Outreach (Signed)
[facility]) Care Management  [place]   07/27/2018  LILYANA LIPPMAN June 17, 1940 [id number]  Reason for referral: medication assistance/CSNP medication  Referral source: follow up  CSNP Referral medication(s): Trelegy Current insurance: [employer]  HPI:  Patient is a 78 year old female with multiple medical conditions including but not limited to:  Acid reflux, allergic rhinitis,  COPD, Depression, hypertension, obesity, osteoporosis, and pulmonary hypertension. Patient expressed concern over Trelegy copay ($45).  Purpose of today's call was CSNP follow up and medication assistance with Trelegy.  Objective: Allergies  Allergen Reactions  . Latex     REACTION: itching  . Ace Inhibitors     REACTION: cough  . Fentanyl     Hallucination "I go crazy"   . Hydrochlorothiazide     REACTION: decreased sodium  . Penicillins Other (See Comments)    REACTION: intolerant due to yeast infection Has patient had a PCN reaction causing immediate rash, facial/tongue/throat swelling, SOB or lightheadedness with hypotension:No Has patient had a PCN reaction causing severe rash involving mucus membranes or skin necrosis: No Has patient had a PCN reaction that required hospitalization No Has patient had a PCN reaction occurring within the last 10 years: Yes If all of the above answers are "NO", then may proceed with Cephalosporin use.     Medications Reviewed Today    Reviewed by [name], [facility] (Pharmacist) on 07/27/18 at 1039  Med List Status: <None>  Medication Order Taking? Sig Documenting Provider Last Dose Status Informant  albuterol (PROVENTIL HFA;VENTOLIN HFA) 108 (90 Base) MCG/ACT inhaler [id number] Yes INHALE 2 PUFFS BY MOUTH IN EACH NOSTRIL EVERY 4 TO 6 HOURS AS NEEDED  Patient taking differently: Inhale into the lungs every 4 (four) hours as needed. INHALE 2 PUFFS BY MOUTH  EVERY 4 TO 6 HOURS AS NEEDED   Kozlow, Donnamarie Poag, MD Taking Active   aspirin (ASPIRIN  81) 81 MG EC tablet [id number] Yes Take 81 mg by mouth daily. [provider] Taking Active   atorvastatin (LIPITOR) 20 MG tablet [id number] Yes TAKE 1 TABLET(20 MG) BY MOUTH DAILY Stanford Breed, Denice Bors, MD Taking Active   candesartan (ATACAND) 4 MG tablet [id number] Yes TAKE 1 TABLET BY MOUTH DAILY Stanford Breed, Denice Bors, MD Taking Active   colestipol (COLESTID) 1 g tablet [id number] Yes 1 tablet by mouth 1-2 times daily as needed for diarrhea Kozlow, Donnamarie Poag, MD Taking Active   denosumab (PROLIA) 60 MG/ML SOLN injection [id number] Yes Inject 60 mg into the skin every 6 (six) months. Administer in upper arm, thigh, or abdomen [provider] Taking Active Self  famotidine (PEPCID) 40 MG tablet [id number] Yes Take 1 tablet (40 mg total) by mouth every evening. Kozlow, Donnamarie Poag, MD Taking Active   fluticasone [facility]) 50 MCG/ACT nasal spray [id number] Yes Use 1-2 sprays per nostril daily Kozlow, Donnamarie Poag, MD Taking Active   Fluticasone-Umeclidin-Vilant (TRELEGY ELLIPTA) 100-62.5-25 MCG/INH AEPB [id number] Yes Inhale 1 puff into the lungs daily. Kozlow, Donnamarie Poag, MD Taking Active   lansoprazole (PREVACID) 30 MG capsule [id number] Yes Take 1 capsule daily in the morning Kozlow, Donnamarie Poag, MD Taking Active   leflunomide (ARAVA) 20 MG tablet [id number] Yes Take 20 mg by mouth daily. [provider] Taking Active Self  metoprolol succinate (TOPROL-XL) 50 MG 24 hr tablet [id number] Yes TAKE 1 TABLET(50 MG) BY MOUTH DAILY Stanford Breed Denice Bors, MD Taking Active   venlafaxine XR (EFFEXOR-XR) 75 MG 24 hr capsule [id number] Yes TK 1 C PO  QD WF [provider] Taking Active           Assessment:  Drugs sorted by system:  Neurologic/Psychologic: Venlafaxine ER  Cardiovascular: Atorvastatin, Aspirin, Metoprolol, Candesartan,   Pulmonary/Allergy: Albuterol HFA, Fluticasone Nasal Spray, Trelegy  Gastrointestinal: Colestipol, Famotidine, Lansoprazole  Endocrine: Prolia,    Rheumatology Leflunomide  Medication Review Findings:  . Aspirin 81mg - patient reported adding Aspirin to her regimen on her own but discussing it with her cardiologist later. Aspirin was not on her medication list so it was added.   Medication Assistance Findings:  Medication assistance needs identified: Trelegy   Patient appears to qualify for [employer] Patient Assistance Program to get Trelegy and Ventolin HFA at no cost  [employer] requires patients to spend at least $600 in out-of-pocket medication expenses to qualify for their program.  According to HTA, patient has spent $482.50 YTD.  In addition, patient's TDS YTD is $3897.50.  When patients spend >$4020 they are in the coverage gap/"donut hole".    HTA reported if the patient went to get Trelegy refilled today, the cost would be $156.41.  When leflunomide is available for refill, it will be $97.67 (90 day supply)  Additional medication assistance options reviewed with patient as warranted:  No other options identified  Plan: I will route patient assistance letter to [place] technician who will coordinate patient assistance program application process for medications listed above.  [facility] pharmacy technician will assist with obtaining all required documents from both patient and provider(s) and submit application(s) once completed.    Follow up with patient in 1 month.  [name], PharmD, [place] Clinical Pharmacist [phone]

## 2018-07-28 ENCOUNTER — Other Ambulatory Visit: Payer: Self-pay | Admitting: Pharmacy Technician

## 2018-07-28 MED ORDER — ALBUTEROL SULFATE HFA 108 (90 BASE) MCG/ACT IN AERS: INHALATION_SPRAY | RESPIRATORY_TRACT | 1 refills | Status: DC

## 2018-07-28 MED ORDER — TRELEGY ELLIPTA 100-62.5-25 MCG/INH IN AEPB: 1.0000 | INHALATION_SPRAY | Freq: Every day | RESPIRATORY_TRACT | 5 refills | Status: DC

## 2018-07-28 NOTE — Patient Outreach (Signed)
[facility]) Care Management  07/28/2018  KARLYE IHRIG 02/25/1940 [id number]  Incoming care coordination call received from Dr. Bruna Potter office.  Spoke to [place] who informed she was going to fax over the scripts. She also was inquiring if she needed to send any additional information. Informed [place]n [place] that we had mailed the patient her portion of the application. Informed [place]n [place] that the application did not require the provider to sign anything. The provider would only have to provide scripts for the medications. Kayla verbalized understanding and informed she would fax the scripts over. Provided Kayla our fax number.  Once receive the application back from the patient then will submit the application to [place].  Patrese Neal P. Adiah Guereca, [place] Management [phone]

## 2018-07-28 NOTE — Addendum Note (Signed)
Addended by: Lucrezia Starch I on: 07/28/2018 10:16 AM   Modules accepted: Orders

## 2018-07-28 NOTE — Progress Notes (Signed)
I spoke with Sharee Pimple today. She informed me that the only thing needed from Dr. Neldon Mc is the prescriptions for the Trelegy and Ventolin HFA. These have been printed, signed and faxed to [phone] [facility].

## 2018-07-28 NOTE — Progress Notes (Signed)
When you receive the forms in [place] please forward to the [place] office so that we can get these completed. Thank you.

## 2018-07-30 ENCOUNTER — Other Ambulatory Visit: Payer: Self-pay | Admitting: Pharmacy Technician

## 2018-07-30 NOTE — Patient Outreach (Signed)
[facility]) Care Management  07/30/2018  Taylor Berry March 22, 1940 [id number]    Incoming call received from patient in regards to [place] application for Trelegy and Ventolin HFA.  Spoke to patient, HIPAA identifiers verified.  Patient called with questions about the PAP application. Discussed patient's questions and she verbalized understanding.  Will followup with patient in 10-14 business days if application has not been returned.  Kaian Fahs P. Emonni Depasquale, [place] Management [phone]

## 2018-08-03 ENCOUNTER — Other Ambulatory Visit: Payer: Self-pay | Admitting: Pharmacy Technician

## 2018-08-03 NOTE — Patient Outreach (Signed)
[place] [facility]) Care Management  08/03/2018  Taylor Berry 1940/07/03 [id number]    Received all necessary documents and signatures from both patient and provider for [name] patient assistance for Trelegy and Ventolin HFA.  Submitted completed application via fax to [place].  Will followup with [employer] in 3-5 business days.  Jorie Zee P. Perfecto Purdy, [place] Management [phone]

## 2018-08-05 ENCOUNTER — Ambulatory Visit: Payer: HMO | Admitting: Cardiothoracic Surgery

## 2018-08-05 ENCOUNTER — Encounter: Payer: Self-pay | Admitting: Cardiothoracic Surgery

## 2018-08-05 ENCOUNTER — Other Ambulatory Visit: Payer: Self-pay | Admitting: Pharmacy Technician

## 2018-08-05 ENCOUNTER — Other Ambulatory Visit: Payer: Self-pay

## 2018-08-05 ENCOUNTER — Ambulatory Visit
Admission: RE | Admit: 2018-08-05 | Discharge: 2018-08-05 | Disposition: A | Discharge status: Home / Self Care | Payer: HMO | Source: Ambulatory Visit | Attending: Emergency Medicine | Admitting: Emergency Medicine

## 2018-08-05 VITALS — BP 159/88 | HR 67 | Temp 95.0°F | Resp 18 | Ht 62.0 in | Wt 169.0 lb

## 2018-08-05 DIAGNOSIS — Z85118 Personal history of other malignant neoplasm of bronchus and lung: Secondary | ICD-10-CM | POA: Diagnosis not present

## 2018-08-05 DIAGNOSIS — Z8541 Personal history of malignant neoplasm of cervix uteri: Secondary | ICD-10-CM

## 2018-08-05 DIAGNOSIS — R918 Other nonspecific abnormal finding of lung field: Secondary | ICD-10-CM

## 2018-08-05 NOTE — Progress Notes (Signed)
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  SURGICAL PROCEDURE: Video bronchoscopy, left video-assisted  thoracoscopy, mini-thoracotomy, wedge resection of left upper lobe with  lymph node dissection, and biopsy of chest wall.  SURGEON: Lanelle Bal, MD  Lung cancer, left upper lobe- Squamous cell    Primary site: Lung (Left)   Staging method: AJCC 7th Edition   Pathologic: Stage IB (T2a, N0, cM0) signed by [name], MD on 04/14/2013  8:31 AM   Summary: Stage IB (T2a, N0, cM0)  History of Present Illness:     Patient returns to the office today for  followup  following  wedge resection of left upper lobe for squamous cell carcinoma stage IB on 04/12/2013.   She was evaluated in early 2019 by cardiology with increasing shortness of breath, there was a discussion about placement of a defibrillator but this was not done.     Zubrod Score: At the time of surgery this patient's most appropriate activity status/level should be described as: '[]'$     0    Normal activity, no symptoms '[x]'$     1    Restricted in physical strenuous activity but ambulatory, able to do out light work '[]'$     2    Ambulatory and capable of self care, unable to do work activities, up and about >50 % of waking hours                              '[]'$     3    Only limited self care, in bed greater than 50% of waking hours '[]'$     4    Completely disabled, no self care, confined to bed or chair '[]'$     5    Moribund    Past Medical History:  Diagnosis Date  .  Abnormal CT scan   . Allergic rhinitis   . Anemia 2015   after lung surgery  . Angio-edema   . Complication of anesthesia    Fentanyl allergy, "claustrophobia"  . COPD (chronic obstructive pulmonary disease) ([facility])   . Depression   . Diaphoresis 07/30/2015   Excessive sweating with little exertion  . Difficult intravenous access   . GERD (gastroesophageal reflux disease)    In the past  . Hyperlipidemia   . Hypertension   . Left bundle branch block   . Lung cancer ([facility]) 2015   left upper lobe wedge removed-no further tx.  . Neuropathy    Tingling in arms, fingers, and feet (Since 06/15/16)  . Obesity   . OSA (obstructive sleep apnea)    denies.  . Osteoarthritis    ra also  . Osteoporosis   . Recurrent upper respiratory infection (URI)   . Rheumatoid arthritis(714.0)    Dr. Stark Jock follows  . SOB (shortness of breath) on exertion   . Uterine cancer ([facility]) 1971   tx surgical     Social  History   Tobacco Use  Smoking Status Former Smoker  . Packs/day: 1.00  . Years: 35.00  . Pack years: 35.00  . Types: Cigarettes  . Quit date: 01/07/1988  . Years since quitting: 30.5  Smokeless Tobacco Never Used    Social History   Substance and Sexual Activity  Alcohol Use No   Comment: not since 1988     Allergies  Allergen Reactions  . Latex     REACTION: itching  . Ace Inhibitors     REACTION: cough  . Fentanyl     Hallucination "I go crazy"   . Hydrochlorothiazide     REACTION: decreased sodium  . Penicillins Other (See Comments)    REACTION: intolerant due to yeast infection Has patient had a PCN reaction causing immediate rash, facial/tongue/throat swelling, SOB or lightheadedness with hypotension:No Has patient had a PCN reaction causing severe rash involving mucus membranes or skin necrosis: No Has patient had a PCN reaction that required hospitalization No Has patient had a PCN reaction occurring within the last 10 years: Yes If all of the above answers are  "NO", then may proceed with Cephalosporin use.     Current Outpatient Medications  Medication Sig Dispense Refill  . albuterol (VENTOLIN HFA) 108 (90 Base) MCG/ACT inhaler INHALE 2 PUFFS BY MOUTH IN EACH NOSTRIL EVERY 4 TO 6 HOURS AS NEEDED 36 g 1  . aspirin (ASPIRIN 81) 81 MG EC tablet Take 81 mg by mouth daily.    Marland Kitchen atorvastatin (LIPITOR) 20 MG tablet TAKE 1 TABLET(20 MG) BY MOUTH DAILY 90 tablet 3  . candesartan (ATACAND) 4 MG tablet TAKE 1 TABLET BY MOUTH DAILY 30 tablet 12  . colestipol (COLESTID) 1 g tablet 1 tablet by mouth 1-2 times daily as needed for diarrhea 60 tablet 5  . denosumab (PROLIA) 60 MG/ML SOLN injection Inject 60 mg into the skin every 6 (six) months. Administer in upper arm, thigh, or abdomen    . famotidine (PEPCID) 40 MG tablet Take 1 tablet (40 mg total) by mouth every evening. 30 tablet 5  . fluticasone (FLONASE) 50 MCG/ACT nasal spray Use 1-2 sprays per nostril daily 16 g 5  . Fluticasone-Umeclidin-Vilant (TRELEGY ELLIPTA) 100-62.5-25 MCG/INH AEPB Inhale 1 puff into the lungs daily. 60 each 5  . lansoprazole (PREVACID) 30 MG capsule Take 1 capsule daily in the morning 30 capsule 5  . leflunomide (ARAVA) 20 MG tablet Take 20 mg by mouth daily.    . metoprolol succinate (TOPROL-XL) 50 MG 24 hr tablet TAKE 1 TABLET(50 MG) BY MOUTH DAILY 90 tablet 1  . venlafaxine XR (EFFEXOR-XR) 75 MG 24 hr capsule TK 1 C PO QD WF     No current facility-administered medications for this visit.        Physical Exam: BP (!) 159/88 (BP Location: Left Arm, Patient Position: Sitting, Cuff Size: Normal)   Pulse 67   Temp (!) 95 F (35 C)   Resp 18   Ht '5\' 2"'$  (1.575 m)   Wt 169 lb (76.7 kg)   SpO2 92% Comment: RA  BMI 30.91 kg/m   General appearance: alert and cooperative Neck: no adenopathy, no carotid bruit, no JVD, supple, symmetrical, trachea midline and thyroid not enlarged, symmetric, no tenderness/mass/nodules Lymph nodes: Cervical, supraclavicular, and axillary  nodes normal. Resp: clear to auscultation bilaterally Cardio: regular rate and rhythm, S1, S2 normal, no murmur, click, rub or gallop Extremities: extremities normal, atraumatic, no cyanosis or edema and Homans sign is negative, no  sign of DVT Neurologic: Grossly normal  Diagnostic Studies & Laboratory data:     Recent Radiology Findings: Ct Chest Wo Contrast  Result Date: 08/05/2018 CLINICAL DATA:  Follow-up pulmonary nodules. EXAM: CT CHEST WITHOUT CONTRAST TECHNIQUE: Multidetector CT imaging of the chest was performed following the standard protocol without IV contrast. COMPARISON:  01/21/2018 FINDINGS: Cardiovascular: Heart size is normal. No pericardial effusion. Aortic atherosclerosis. Left main, lad and RCA coronary artery calcifications. Mediastinum/Nodes: No enlarged mediastinal or axillary lymph nodes. Thyroid gland, trachea, and esophagus demonstrate no significant findings. Lungs/Pleura: Postoperative left upper lobe is again noted. Stable postop change in the left upper lobe. The previously referenced bulla along the surgical site within the left upper lobe is no longer visualized. Mild to moderate cylindrical and varicoid bronchiectasis throughout the mid to lower lung zones bilaterally with bronchiectasis, diffuse bronchial wall thickening, patchy tree-in-bud opacities and scattered parenchymal bands. Multiple pulmonary nodules: -index nodule within the subpleural aspect of the medial right lower lobe is unchanged measuring 1.8 cm. -stable 2 mm right apical lung nodules, image 19/8 and image 16/8. Upper Abdomen: No acute abnormality left adrenal gland adenoma is unchanged measuring 1.4 cm. Right adrenal gland adenoma is also unchanged measuring 1.1 cm. Musculoskeletal: No chest wall mass or suspicious bone lesions identified. IMPRESSION: 1. Stable exam. Status post wedge resection from the posterolateral right upper lobe. No specific findings are identified to suggest local tumor  recurrence. 2. Subpleural nodule in the right lower lobe measuring 1.8 cm maximum dimension is stable from the previous exam. Further investigation with either PET-CT or continued interval surveillance is advised. 3. Unchanged are tiny right apex lung nodules measuring up to 2 mm. 4. Unchanged appearance of chronic interstitial changes including bronchiectasis, tree-in-bud nodularity, bronchial wall thickening, and scattered parenchymal banding. 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 Electronically Signed   By: Kerby Moors M.D.   On: 08/05/2018 16:21   I have independently reviewed the above radiology studies  and reviewed the findings with the patient.   CLINICAL DATA:  Solitary pulmonary nodule.  EXAM: CT CHEST WITH CONTRAST  TECHNIQUE: Multidetector CT imaging of the chest was performed during intravenous contrast administration.  CONTRAST:  19m ISOVUE-300 IOPAMIDOL (ISOVUE-300) INJECTION 61%  COMPARISON:  04/17/2016.  FINDINGS: Cardiovascular: The heart size is normal. No pericardial effusion. Coronary artery calcification is evident. Atherosclerotic calcification is noted in the wall of the thoracic aorta. Pulmonary scratches that prominence of the main pulmonary arteries (3.0 cm diameter on the right and 3.2 cm diameter on the left) suggests pulmonary arterial hypertension.  Mediastinum/Nodes: No mediastinal lymphadenopathy. There is no hilar lymphadenopathy. The esophagus has normal imaging features. There is no axillary lymphadenopathy.  Lungs/Pleura: Biapical pleural-parenchymal scarring again noted. 3 mm left apical nodule (image 23 series 3) is unchanged. 3 mm perifissural nodule in the left lung seen on image 52 today is stable. Surgical scarring in the posterior left upper lobe is stable. Chronic atelectasis or scarring in the right middle lobe is unchanged in the interval.  No focal  airspace consolidation. No pulmonary edema or pleural effusion. Circumferential bronchial wall thickening with changes of emphysema again noted.  Upper Abdomen: Small hypoattenuating lesion in the dome of the left liver is stable and compatible with a cyst. 1.4 x 1.6 cm left adrenal nodule was largely obscured by motion artifact on the previous study and less well demonstrated on earlier studies due to the thicker slice collimation used on those prior  exams. This is unchanged in the interval and also comparing back to 04/13/2014. Imaging features most consistent with benign adrenal adenoma. Diverticular changes are noted in the abdominal segments of the colon. Possible tiny calcified gallstones.  Musculoskeletal: Bone windows reveal no worrisome lytic or sclerotic osseous lesions.  IMPRESSION: 1. Stable exam.  No new or progressive findings. 2. Stable appearance of surgical scarring posterior left upper lobe. 3. Prominence of the main pulmonary arteries raise the question of pulmonary arterial hypertension. 4.  Aortic Atherosclerois (ICD10-170.0) 5.  Emphysema. PZ:[id number])   Electronically Signed   By: Misty Stanley M.D.   On: 01/22/2017 14:19   Ct Chest Wo Contrast  04/26/2015  CLINICAL DATA:  Lung cancer with left upper lobe resection. Uterine cancer with hysterectomy. Ex-smoker. Follow-up lung nodule. EXAM: CT CHEST WITHOUT CONTRAST TECHNIQUE: Multidetector CT imaging of the chest was performed following the standard protocol without IV contrast. COMPARISON:  11/02/2014 FINDINGS: Mediastinum/Nodes: Aortic and branch vessel atherosclerosis. Mild cardiomegaly. Multivessel coronary artery atherosclerosis. Pulmonary artery enlargement, with the main pulmonary arteries measuring 2.9 and 3.1 cm respectively. 7 mm precarinal node is unchanged and not pathologic by size criteria. Hilar regions poorly evaluated without intravenous contrast. Lungs/Pleura: No pleural fluid. Lower lobe  predominant bronchial wall thickening. Mild centrilobular emphysema. 2 mm right apical pulmonary nodule is unchanged on image 19/series 4. Right middle lobe volume loss and mild bronchiectasis. Similar. Bibasilar bronchial wall thickening is moderate and likely post infectious or inflammatory. Posterior left upper lobe wedge resection. Similar appearance of for trace residual loculated pleural air. Example image 52/series 4. Nodularity along the surgical sutures is resolved. Similar minimal thickening of the left major fissure image 41/series 4. Upper abdomen: Left hepatic lobe subcentimeter cyst. Probable gallstones. Normal imaged portions of the spleen, stomach, pancreas, adrenal glands, kidneys. Musculoskeletal: Moderate thoracic spondylosis IMPRESSION: 1. Status post left upper lobe wedge resection, without recurrent or metastatic disease. 2. Decrease in nodularity along the surgical sutures. Persistent loculated trace left pleural air, postoperative. 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 3.  Atherosclerosis, including within the coronary arteries. 4. Pulmonary artery enlargement suggests pulmonary arterial hypertension. Electronically Signed   By: Abigail Miyamoto M.D.   On: 04/26/2015 14:14   Ct Chest Wo Contrast  11/02/2014  CLINICAL DATA:  Lung cancer status post left upper lobe wedge resection in 04/2013. History of uterine cancer status post hysterectomy in 1971. EXAM: CT CHEST WITHOUT CONTRAST TECHNIQUE: Multidetector CT imaging of the chest was performed following the standard protocol without IV contrast. COMPARISON:  04/13/2014 FINDINGS: Mediastinum/Nodes: The heart is normal in size. No pericardial effusion. Coronary atherosclerosis. Atherosclerotic calcifications of the aortic arch. Small mediastinal lymph nodes measuring up to 7 mm short axis (series 3/ image 25), unchanged. Visualized left thyroid is mildly enlarged/heterogeneous. Lungs/Pleura: Postsurgical changes related to posterior left  upper lobe wedge resection (series 4/image 28). Minimal nodularity along the left fissure (series 4/ image 22), postsurgical. 2-3 mm subpleural nodule in the lateral right lung apex (series 4/image 11, unchanged since 2015, benign. No suspicious pulmonary nodules. Mild scarring with bronchiectasis in the right middle lobe (series 4/image 40). Mild bronchial wall thickening with subpleural reticulation/ fibrosis in the bilateral lower lobes. No focal consolidation. Underlying mild paraseptal emphysematous changes. No pleural effusion or pneumothorax. Upper abdomen: Visualized upper abdomen is notable for a 10 mm left adrenal adenoma. Musculoskeletal: Degenerative changes of the visualized thoracolumbar spine. IMPRESSION: Postsurgical changes related to left upper lobe wedge resection. No evidence of recurrent or metastatic disease in the  chest. Electronically Signed   By: Julian Hy M.D.   On: 11/02/2014 15:14   Ct Chest Wo Contrast  04/13/2014   CLINICAL DATA:  78 year old female with left upper lobe lung cancer status post resection 6 months ago. Tenderness at the surgical site.  EXAM: CT CHEST WITHOUT CONTRAST  TECHNIQUE: Multidetector CT imaging of the chest was performed following the standard protocol without IV contrast.  COMPARISON:  Chest CT 10/06/2013.  FINDINGS: Mediastinum/Lymph Nodes: Heart size is normal. There is no significant pericardial fluid, thickening or pericardial calcification. There is atherosclerosis of the thoracic aorta, the great vessels of the mediastinum and the coronary arteries, including calcified atherosclerotic plaque in the left main, left anterior descending and right coronary arteries. No pathologically enlarged mediastinal or hilar lymph nodes. Please note that accurate exclusion of hilar adenopathy is limited on noncontrast CT scans. Esophagus is unremarkable in appearance. No axillary lymphadenopathy.  Lungs/Pleura: Postoperative changes of wedge resection are again  noted in the lateral aspect of the left upper lobe. Again noted is a large peripheral postoperative pneumatocele in this region, which has decreased significantly in size compared to the prior study. A few scattered 2-3 mm micronodules are noted throughout the lungs bilaterally, peribronchovascular in distribution, favored to represent areas of chronic mucoid impaction within terminal bronchioles, similar to prior studies. No larger more suspicious appearing pulmonary nodules or masses are noted. Mild diffuse bronchial wall thickening, with some scattered areas of cylindrical and mild varicose bronchiectasis, most evident in the lower lobes of the lungs bilaterally. No acute consolidative airspace disease. No pleural effusions. There continues to be a small amount of herniation have the left lower lobe through the fifth intercostal space laterally, at the site of thoracotomy. Trace amount of residual gas in the left serratus musculature, significantly decreased compared to the prior examination.  Upper Abdomen: Atherosclerosis.  Musculoskeletal/Soft Tissues: Postthoracotomy changes in the left ribs. There are no aggressive appearing lytic or blastic lesions noted in the visualized portions of the skeleton.  IMPRESSION: 1. Status post left upper lobe wedge resection, with no findings to suggest local recurrence of disease. Decreasing size of postoperative the mass steel in the left upper lobe, and decreasing herniation through the left fifth intercostal space, as discussed above. 2. Diffuse bronchial wall thickening with areas of cylindrical and mild varicose bronchiectasis, most evident in the lower lobes of the lungs bilaterally. These findings are similar to recent prior examinations. 3. Atherosclerosis, including left main and 2 vessel coronary artery disease. Assessment for potential risk factor modification, dietary therapy or pharmacologic therapy may be warranted, if clinically indicated. 4. Additional  incidental findings, as above.   Electronically Signed   By: Vinnie Langton M.D.   On: 04/13/2014 13:24    Recent Lab Findings: Lab Results  Component Value Date   WBC 4.2 10/03/2016   HGB 12.0 10/03/2016   HCT 35.5 10/03/2016   PLT 159 10/03/2016   GLUCOSE 109 (H) 01/22/2017   ALT 6 10/03/2016   AST 25 10/03/2016   NA 141 01/22/2017   K 4.0 01/22/2017   CL 105 01/22/2017   CREATININE 0.58 01/22/2017   BUN 17 01/22/2017   CO2 30 01/22/2017   TSH 0.956 10/03/2016   INR 1.0 10/03/2016   Wt Readings from Last 3 Encounters:  08/05/18 169 lb (76.7 kg)  06/15/18 169 lb (76.7 kg)  03/22/18 163 lb (73.9 kg)     Assessment / Plan:   Patient now 5 years following wedge resection left  upper lobe non-small cell lung cancer-without evidence of recurrence on CT. recent CT suggest chronic infection in the left lower lobe possible atypical Mycobacterium.   Patient does have a 1.8 cm Subpleural nodule in the right lower lobe measuring 1.8 cm stable from previous exams.  The patient continues to be followed by Dr. Lamonte Sakai.  I have not made a return appointment for her to see me, she does need continued follow-up both for her underlying pulmonary disease and lung nodularity.  With her underlying pulmonary status she would not be a candidate for any further surgical resection.   [name] MD      [address] [place] [address] Office [phone]   Beeper [id number]  08/05/2018 5:09 PM

## 2018-08-05 NOTE — Patient Outreach (Signed)
[facility]) Care [facility]  08/05/2018  Taylor Berry March 01, 1940 [id number]   Care coordination call placed to [facility] in regards to patient's application for Trelegy and Ventolin HFA.  Spoke to [place] who informed patient had been APPROVED 08/05/2018-01/06/2019. The process normally takes 24-72 business hours for the pharmacy to receive and process the order. Once that 72 hours is up they will ship out the medication and a 3 months supply should arrive at the patient's home within 7-10 business days.  Will followup with patient in 10-14 business days to inquire if medication was received.  Theodis Kinsel P. Kass Herberger, Taylor Berry [phone]

## 2018-08-18 ENCOUNTER — Encounter: Payer: Self-pay | Admitting: *Deleted

## 2018-08-18 ENCOUNTER — Other Ambulatory Visit: Payer: Self-pay | Admitting: Pharmacy Technician

## 2018-08-18 NOTE — Patient Outreach (Signed)
[facility]) Care Management  08/18/2018  Taylor Berry 03/29/1940 [id number]   Successful outreach call placed to patient in regards to [facility] application for Trelegy and Ventolin HFA.  Spoke to patient, HIPAA identifiers verified.  Patient informed she received 3 inhalers of Trelegy and 3 inhalers of Ventolin HFA. Discussed refill procedure with patient. Patient informed she kept the materials that came with the package that contains the number to call for refills. Informed patient to call when she has a 2-3 week supply left in order to give the company time to process and ship the medication. Patient verbalized understanding. Informed patient that the enrollment period ends at the end of the year and then she will have to meet the OOP requirement again and reapply. Patient verbalized understanding. Patient informed she had no other questions or concerns relating to patient assistance. Confirmed patient had name and number.  Will route note to [place] that patient assistance has been completed and will remove myself from care team.  [name]. [name], [facility] [phone]

## 2018-08-31 ENCOUNTER — Ambulatory Visit: Payer: Self-pay | Admitting: Pharmacist

## 2018-09-21 ENCOUNTER — Encounter: Payer: Self-pay | Admitting: Emergency Medicine

## 2018-09-21 ENCOUNTER — Ambulatory Visit (INDEPENDENT_AMBULATORY_CARE_PROVIDER_SITE_OTHER): Payer: HMO | Admitting: Emergency Medicine

## 2018-09-21 ENCOUNTER — Other Ambulatory Visit: Payer: Self-pay

## 2018-09-21 DIAGNOSIS — R9389 Abnormal findings on diagnostic imaging of other specified body structures: Secondary | ICD-10-CM | POA: Diagnosis not present

## 2018-09-21 DIAGNOSIS — G4733 Obstructive sleep apnea (adult) (pediatric): Secondary | ICD-10-CM

## 2018-09-21 DIAGNOSIS — C3412 Malignant neoplasm of upper lobe, left bronchus or lung: Secondary | ICD-10-CM | POA: Diagnosis not present

## 2018-09-21 DIAGNOSIS — J449 Chronic obstructive pulmonary disease, unspecified: Secondary | ICD-10-CM

## 2018-09-21 NOTE — Progress Notes (Signed)
  HPI:  ROV 09/21/2018 --Ms. Gardiner is 6, has a history of severe COPD and squamous cell lung cancer post left upper lobe wedge resection.  Also with rheumatoid arthritis on Arava, untreated OSA, CAD, uterine cancer.  We have been following a 1.8 x 1.0 cm subpleural right lower lobe nodular opacity on serial CT scans.  Her most recent was 08/12/2018 which I reviewed, shows that the right lower lobe nodule stable in size, 1.8 cm.  There are stable right apical micronodules present, unchanged chronic interstitial changes with associated bronchiectasis and tree-in-bud nodularity (in the setting of her RA).  Her COPD is currently managed on Trelegy, she uses albuterol approximately. She remains on fluticasone nasal spray, Pepcid. She has not been very active due to isolation efforts. She is not having any cough or sputum. No flares since. Pneumovax up to date. Needs her flu shot.    EXAM:  Vitals:   09/21/18 1020  BP: 122/82  Pulse: 64  SpO2: 95%  Weight: 172 lb (78 kg)  Height: 5\' 2"  (1.575 m)   Gen: Pleasant, obese, in no distress,  normal affect, in wheelchair   ENT: No lesions,  mouth clear,  oropharynx clear, no postnasal drip  Neck: No JVD, no TMG, no carotid bruits  Lungs: No use of accessory muscles, clear bilaterally, no wheeze.   Cardiovascular: RRR, heart sounds normal, no murmur or gallops, no peripheral edema  Musculoskeletal: No deformities, no cyanosis or clubbing  Neuro: alert, non focal  Skin: Warm, no lesions or rashes     COPD (chronic obstructive pulmonary disease) Overall stable without any evidence of exacerbation.  Plan to continue same regimen  Please continue Trelegy 1 inhalation daily.  Rinse and gargle after using. Keep your albuterol available use 2 puffs if needed for shortness of breath, chest tightness, wheezing. Pneumovax is up-to-date Please get your high-dose flu shot this fall. Follow with Dr Lamonte Sakai in 6 months or sooner if you have any  problems  OSA (obstructive sleep apnea) Untreated  Lung cancer, left upper lobe No evidence of recurrence on her most recent CT scan of the chest 08/2018  Abnormal CT of the chest Some bronchial wall thickening, micronodular disease and a stable 1.8 subpleural right lower lobe nodule.  Suspect related to her rheumatoid disease.  If she develops signs of bronchitis, bronchiectasis flare then it may be reasonable to consider sputum culture or even bronchoscopy to evaluate for opportunistic organisms.  Plan to repeat her CT chest in 1 year.  Baltazar Apo, MD, PhD 09/21/2018, 10:41 AM [facility] [phone] or if no answer [phone]

## 2018-09-21 NOTE — Assessment & Plan Note (Signed)
Overall stable without any evidence of exacerbation.  Plan to continue same regimen  Please continue Trelegy 1 inhalation daily.  Rinse and gargle after using. Keep your albuterol available use 2 puffs if needed for shortness of breath, chest tightness, wheezing. Pneumovax is up-to-date Please get your high-dose flu shot this fall. Follow with Dr Lamonte Sakai in 6 months or sooner if you have any problems

## 2018-09-21 NOTE — Assessment & Plan Note (Signed)
Some bronchial wall thickening, micronodular disease and a stable 1.8 subpleural right lower lobe nodule.  Suspect related to her rheumatoid disease.  If she develops signs of bronchitis, bronchiectasis flare then it may be reasonable to consider sputum culture or even bronchoscopy to evaluate for opportunistic organisms.  Plan to repeat her CT chest in 1 year.

## 2018-09-21 NOTE — Assessment & Plan Note (Signed)
Untreated  

## 2018-09-21 NOTE — Assessment & Plan Note (Signed)
No evidence of recurrence on her most recent CT scan of the chest 08/2018

## 2018-09-21 NOTE — Patient Instructions (Signed)
Please continue Trelegy 1 inhalation daily.  Rinse and gargle after using. Keep your albuterol available use 2 puffs if needed for shortness of breath, chest tightness, wheezing. Your CT scan of the chest is stable compared with your priors.  This is good news.  We will repeat your scan without contrast in August 2021 Please let us know if you develop any new symptoms of coughing, mucus production. Pneumovax is up-to-date Please get your high-dose flu shot this fall. Follow with Dr Lamonte Sakai in 6 months or sooner if you have any problems

## 2018-09-29 ENCOUNTER — Other Ambulatory Visit: Payer: Self-pay

## 2018-09-29 DIAGNOSIS — E7800 Pure hypercholesterolemia, unspecified: Secondary | ICD-10-CM

## 2018-09-29 MED ORDER — ATORVASTATIN CALCIUM 20 MG PO TABS: ORAL_TABLET | ORAL | 2 refills | Status: DC

## 2018-10-05 ENCOUNTER — Ambulatory Visit: Payer: HMO | Admitting: Cardiology

## 2018-10-07 ENCOUNTER — Ambulatory Visit: Payer: Self-pay | Admitting: Pharmacist

## 2018-10-08 DIAGNOSIS — R29898 Other symptoms and signs involving the musculoskeletal system: Secondary | ICD-10-CM | POA: Diagnosis not present

## 2018-10-08 DIAGNOSIS — R0602 Shortness of breath: Secondary | ICD-10-CM | POA: Diagnosis not present

## 2018-10-08 DIAGNOSIS — Z79899 Other long term (current) drug therapy: Secondary | ICD-10-CM | POA: Diagnosis not present

## 2018-10-08 DIAGNOSIS — R682 Dry mouth, unspecified: Secondary | ICD-10-CM | POA: Diagnosis not present

## 2018-10-08 DIAGNOSIS — M150 Primary generalized (osteo)arthritis: Secondary | ICD-10-CM | POA: Diagnosis not present

## 2018-10-08 DIAGNOSIS — M810 Age-related osteoporosis without current pathological fracture: Secondary | ICD-10-CM | POA: Diagnosis not present

## 2018-10-08 DIAGNOSIS — C3490 Malignant neoplasm of unspecified part of unspecified bronchus or lung: Secondary | ICD-10-CM | POA: Diagnosis not present

## 2018-10-08 DIAGNOSIS — M0589 Other rheumatoid arthritis with rheumatoid factor of multiple sites: Secondary | ICD-10-CM | POA: Diagnosis not present

## 2018-10-08 DIAGNOSIS — E785 Hyperlipidemia, unspecified: Secondary | ICD-10-CM | POA: Diagnosis not present

## 2018-10-08 DIAGNOSIS — R232 Flushing: Secondary | ICD-10-CM | POA: Diagnosis not present

## 2018-10-08 DIAGNOSIS — M7989 Other specified soft tissue disorders: Secondary | ICD-10-CM | POA: Diagnosis not present

## 2018-10-09 DIAGNOSIS — Z23 Encounter for immunization: Secondary | ICD-10-CM | POA: Diagnosis not present

## 2018-10-12 ENCOUNTER — Other Ambulatory Visit: Payer: Self-pay

## 2018-10-12 ENCOUNTER — Encounter: Payer: Self-pay | Admitting: Sports Medicine

## 2018-10-12 ENCOUNTER — Ambulatory Visit: Payer: HMO | Admitting: Sports Medicine

## 2018-10-12 DIAGNOSIS — B351 Tinea unguium: Secondary | ICD-10-CM | POA: Diagnosis not present

## 2018-10-12 DIAGNOSIS — M79674 Pain in right toe(s): Secondary | ICD-10-CM

## 2018-10-12 DIAGNOSIS — M79675 Pain in left toe(s): Secondary | ICD-10-CM

## 2018-10-12 NOTE — Patient Instructions (Signed)
Take a break from nail polish  Get Biotin or Hair skin and nail supplement   Tea Tree Oil to nails   If there is thickness to nails may file nails after soaks or after bath/shower with nail file and apply tea tree oil. Apply oil daily to nails after filing for the best result.

## 2018-10-12 NOTE — Progress Notes (Signed)
Subjective: Taylor Berry is a 78 y.o. female patient seen today in office with complaint of mildly painful thickened and elongated toenails; unable to trim. Patient reports that she noticed some splitting of both her big toes at the ends going on worse for the last month states that is not painful and has not trim them because she wanted them to be assessed to see what she should do.  Patient has no other pedal complaints at this time.   Patient Active Problem List   Diagnosis Date Noted  . Abnormal CT of the chest 03/22/2018  . Acid reflux 07/13/2017  . Affective psychosis ([place]) 07/13/2017  . Alcohol dependence in remission ([place]) 07/13/2017  . BD (Bowen's disease) 07/13/2017  . History of colon polyps 07/13/2017  . Malignant neoplasm of respiratory tract ([place]) 07/13/2017  . Rheumatoid arthritis ([place]) 07/13/2017  . Chronic bronchitis ([place]) 07/13/2017  . Secondary pulmonary hypertension 07/13/2017  . Obstructive apnea 07/13/2017  . Age related osteoporosis 07/13/2017  . Non-ischemic cardiomyopathy ([place])   . Hyperhidrosis 06/18/2016  . Diaphoresis 06/05/2015  . Pneumothorax, left 08/31/2013  . Acute bronchitis 05/10/2013  . Anemia in neoplastic disease 05/02/2013  . Left Chest Wound infection 04/29/2013  . Acute blood loss anemia 04/29/2013  . Allergic rhinitis 04/29/2013  . Depression 04/29/2013  . Acute respiratory failure ([place]) 04/22/2013  . Lung cancer, left upper lobe 02/03/2013  . Pulmonary infiltrates 01/05/2013  . Mild pulmonary hypertension ([place]) 02/17/2011  . OSA (obstructive sleep apnea) 02/17/2011  . Dyspnea on exertion 11/22/2010  . COPD (chronic obstructive pulmonary disease) ([place]) 07/20/2009  . Carcinoma in situ of cervix uteri 06/26/2009  . Pure hypercholesterolemia 06/26/2009  . OBESITY 06/26/2009  . UNSPECIFIED ANEMIA 06/26/2009  . Essential (primary) hypertension 06/26/2009  . ARTHRITIS, RHEUMATOID 06/26/2009  . OSTEOPOROSIS 06/26/2009  . Cough  06/26/2009    Current Outpatient Medications on File Prior to Visit  Medication Sig Dispense Refill  . albuterol (VENTOLIN HFA) 108 (90 Base) MCG/ACT inhaler INHALE 2 PUFFS BY MOUTH IN EACH NOSTRIL EVERY 4 TO 6 HOURS AS NEEDED 36 g 1  . aspirin (ASPIRIN 81) 81 MG EC tablet Take 81 mg by mouth daily.    Marland Kitchen atorvastatin (LIPITOR) 20 MG tablet TAKE 1 TABLET(20 MG) BY MOUTH DAILY 90 tablet 2  . candesartan (ATACAND) 4 MG tablet TAKE 1 TABLET BY MOUTH DAILY 30 tablet 12  . colestipol (COLESTID) 1 g tablet 1 tablet by mouth 1-2 times daily as needed for diarrhea 60 tablet 5  . denosumab (PROLIA) 60 MG/ML SOLN injection Inject 60 mg into the skin every 6 (six) months. Administer in upper arm, thigh, or abdomen    . famotidine (PEPCID) 40 MG tablet Take 1 tablet (40 mg total) by mouth every evening. 30 tablet 5  . fluticasone (FLONASE) 50 MCG/ACT nasal spray Use 1-2 sprays per nostril daily 16 g 5  . Fluticasone-Umeclidin-Vilant (TRELEGY ELLIPTA) 100-62.5-25 MCG/INH AEPB Inhale 1 puff into the lungs daily. 60 each 5  . hydroxychloroquine (PLAQUENIL) 200 MG tablet     . lansoprazole (PREVACID) 30 MG capsule Take 1 capsule daily in the morning 30 capsule 5  . leflunomide (ARAVA) 20 MG tablet Take 20 mg by mouth daily.    . metoprolol succinate (TOPROL-XL) 50 MG 24 hr tablet TAKE 1 TABLET(50 MG) BY MOUTH DAILY 90 tablet 1  . venlafaxine XR (EFFEXOR-XR) 75 MG 24 hr capsule TK 1 C PO QD WF     No current facility-administered medications on  file prior to visit.     Allergies  Allergen Reactions  . Latex     REACTION: itching  . Ace Inhibitors     REACTION: cough  . Fentanyl     Hallucination "I go crazy"   . Hydrochlorothiazide     REACTION: decreased sodium  . Penicillins Other (See Comments)    REACTION: intolerant due to yeast infection Has patient had a PCN reaction causing immediate rash, facial/tongue/throat swelling, SOB or lightheadedness with hypotension:No Has patient had a PCN  reaction causing severe rash involving mucus membranes or skin necrosis: No Has patient had a PCN reaction that required hospitalization No Has patient had a PCN reaction occurring within the last 10 years: Yes If all of the above answers are "NO", then may proceed with Cephalosporin use.     Objective: Physical Exam  General: Well developed, nourished, no acute distress, awake, alert and oriented x 3  Vascular: Dorsalis pedis artery 1/4 bilateral, Posterior tibial artery 1/4 bilateral, skin temperature warm to warm proximal to distal bilateral lower extremities, mild varicosities, scant pedal hair present bilateral.  Neurological: Gross sensation present via light touch bilateral.   Dermatological: Skin is warm, dry, and supple bilateral, Nails 1-10 are tender, long, thick, and discolored with mild subungal debris with distal splitting bilateral hallux, no webspace macerations present bilateral, no open lesions present bilateral, no callus/corns/hyperkeratotic tissue present bilateral. No signs of infection bilateral.  Musculoskeletal: Asymptomatic hammertoe boney deformities noted bilateral. Muscular strength within normal limits without painon range of motion. No pain with calf compression bilateral.  Assessment and Plan:  Problem List Items Addressed This Visit    None    Visit Diagnoses    Pain due to onychomycosis of toenails of both feet    -  Primary     -Examined patient.  -Discussed treatment options for painful mycotic nails. -Mechanically debrided and reduced mycotic nails with sterile nail nipper and dremel nail file without incident. -Advised patient to take breaks from nail polish that a lot of these changes are due to excessive use of polish constantly -Recommend biotin or hair skin and nail supplement -Advised patient to use tea tree oil to nails daily -Patient to return as needed for follow up evaluation or sooner if symptoms worsen.  Landis Martins, DPM

## 2018-10-27 ENCOUNTER — Other Ambulatory Visit: Payer: Self-pay | Admitting: Pharmacist

## 2018-10-27 ENCOUNTER — Ambulatory Visit: Payer: Self-pay | Admitting: Pharmacist

## 2018-10-27 NOTE — Patient Outreach (Signed)
[facility]) Care Management  10/27/2018  Taylor Berry 01-Nov-1940 [id number]  Patient was called regarding CNSP follow up. Patient answered the phone and said she was at a dentist's appointment at the time of my call.   Plan: Call patient back within the next 1-2 weeks.  [name], PharmD, [place] Clinical Pharmacist [phone]

## 2018-11-01 ENCOUNTER — Other Ambulatory Visit: Payer: Self-pay | Admitting: Pharmacist

## 2018-11-01 NOTE — Patient Outreach (Signed)
[facility]) Care Management  11/01/2018  Taylor Berry 21-Oct-1940 [id number]   Patient was called for CNSP quarterly follow up call. Unfortunately, she did not answer her phone. HIPAA compliant message was left on her voicemail.  Plan: Call patient back in 3 months for CSNP Follow up .  [name], PharmD, [place] Clinical Pharmacist [phone]

## 2018-11-02 ENCOUNTER — Ambulatory Visit: Payer: Self-pay | Admitting: Pharmacist

## 2018-11-12 ENCOUNTER — Other Ambulatory Visit: Payer: Self-pay

## 2018-11-12 MED ORDER — METOPROLOL SUCCINATE ER 50 MG PO TB24: ORAL_TABLET | ORAL | 1 refills | Status: DC

## 2018-11-16 DIAGNOSIS — Z1231 Encounter for screening mammogram for malignant neoplasm of breast: Secondary | ICD-10-CM | POA: Diagnosis not present

## 2018-11-16 DIAGNOSIS — Z803 Family history of malignant neoplasm of breast: Secondary | ICD-10-CM | POA: Diagnosis not present

## 2018-11-29 ENCOUNTER — Other Ambulatory Visit: Payer: Self-pay | Admitting: Pharmacist

## 2018-11-29 NOTE — Patient Outreach (Signed)
.  [facility]) Care Management  11/29/2018  Taylor Berry [birth year] [id number]  Patient called to inquire about the two [employer] applications she received in the mail.  HIPAA identifiers were obtained.  Patient is a 78 year old female with multiple medical conditions including but not limited to:  Osteoporosis, affective psychosis, allergic rhinitis, rheumatoid arthritis, COPD, depression, hypertension, pulmonary hypertension, OSA, and obesity.  Patient wondered about what she needed to do with the two applications she received from [place]. She was assured she needed to hold on to them for the 2021benefit year  (due to the $600 out-of-pocket requirement) but the patient was not convinced.  [employer]'s program was called and a representaative explained two applications were sent for 2021 because the patient received two medications from them (Ventolin HFA and Trelegy).  The representative also expalined that the patient would have to spend at least $600 in out-of-pocket medication expenses for 2021 to qualify.  An order was placed for Trelegy refills. Ventolin HFA was out of refills and needs a new prescription sent. Patient said she did not need Venolin HFA.  The representative said the refill of Trelegy would arrive withtin the next 10-14 business days. Confirmation number:  [id number].  Plan: Call patient back in 3 months for CSNP follow up and to assess where she is in the benefit year as far as her medication costs are concerned.  [name], PharmD, [place] Clinical Pharmacist [phone]

## 2018-11-29 NOTE — Progress Notes (Signed)
HPI: FU CM. Pt withH/O dyspnea; also with h/o lung ca, COPD and OSA. Echocardiogram in December of 2012 showed an ejection fraction of 99991111, grade 1 diastolic dysfunction and mild left atrial enlargement. Cardiac catheterization October 2018 showed mild nonobstructive coronary disease and normal filling pressures. Echocardiogram repeated [phone] and showed ejection fraction 30-35% and mild left atrial enlargement. Declined ICD previously. Since last seenshe has mild dyspnea on exertion.  No orthopnea, PND, chest pain or syncope.  Minimal pedal edema.  Current Outpatient Medications  Medication Sig Dispense Refill  . albuterol (VENTOLIN HFA) 108 (90 Base) MCG/ACT inhaler INHALE 2 PUFFS BY MOUTH IN EACH NOSTRIL EVERY 4 TO 6 HOURS AS NEEDED 36 g 1  . aspirin (ASPIRIN 81) 81 MG EC tablet Take 81 mg by mouth daily.    Marland Kitchen atorvastatin (LIPITOR) 20 MG tablet TAKE 1 TABLET(20 MG) BY MOUTH DAILY 90 tablet 2  . candesartan (ATACAND) 4 MG tablet TAKE 1 TABLET BY MOUTH DAILY 30 tablet 12  . colestipol (COLESTID) 1 g tablet 1 tablet by mouth 1-2 times daily as needed for diarrhea 60 tablet 5  . famotidine (PEPCID) 40 MG tablet Take 1 tablet (40 mg total) by mouth every evening. 30 tablet 5  . fluticasone (FLONASE) 50 MCG/ACT nasal spray Use 1-2 sprays per nostril daily 16 g 5  . Fluticasone-Umeclidin-Vilant (TRELEGY ELLIPTA) 100-62.5-25 MCG/INH AEPB Inhale 1 puff into the lungs daily. 60 each 5  . hydroxychloroquine (PLAQUENIL) 200 MG tablet     . lansoprazole (PREVACID) 30 MG capsule Take 1 capsule daily in the morning 30 capsule 5  . leflunomide (ARAVA) 20 MG tablet Take 20 mg by mouth daily.    . metoprolol succinate (TOPROL-XL) 50 MG 24 hr tablet TAKE 1 TABLET(50 MG) BY MOUTH DAILY 90 tablet 1  . venlafaxine XR (EFFEXOR-XR) 75 MG 24 hr capsule TK 1 C PO QD WF    . denosumab (PROLIA) 60 MG/ML SOLN injection Inject 60 mg into the skin every 6 (six) months. Administer in upper arm, thigh, or  abdomen     No current facility-administered medications for this visit.      Past Medical History:  Diagnosis Date  . Abnormal CT scan   . Allergic rhinitis   . Anemia 2015   after lung surgery  . Angio-edema   . Complication of anesthesia    Fentanyl allergy, "claustrophobia"  . COPD (chronic obstructive pulmonary disease) ([place])   . Depression   . Diaphoresis 07/30/2015   Excessive sweating with little exertion  . Difficult intravenous access   . GERD (gastroesophageal reflux disease)    In the past  . Hyperlipidemia   . Hypertension   . Left bundle branch block   . Lung cancer ([place]) 2015   left upper lobe wedge removed-no further tx.  . Neuropathy    Tingling in arms, fingers, and feet (Since 06/15/16)  . Obesity   . OSA (obstructive sleep apnea)    denies.  . Osteoarthritis    ra also  . Osteoporosis   . Recurrent upper respiratory infection (URI)   . Rheumatoid arthritis(714.0)    Dr. Stark Jock follows  . SOB (shortness of breath) on exertion   . Uterine cancer ([place]) 1971   tx surgical    Past Surgical History:  Procedure Laterality Date  . ABDOMINAL HYSTERECTOMY     in situ carcinoma partial  . CARDIAC CATHETERIZATION  01/27/11   minor non-obs CAD, NL EF, mild pulm HTN  .  CATARACT EXTRACTION, BILATERAL     last done 12'16-  . COLONOSCOPY W/ BIOPSIES AND POLYPECTOMY    . COLONOSCOPY WITH PROPOFOL N/A 02/13/2015   Procedure: COLONOSCOPY WITH PROPOFOL;  Surgeon: Laurence Spates, MD;  Location: [facility];  Service: Endoscopy;  Laterality: N/A;  . ESOPHAGOGASTRODUODENOSCOPY (EGD) WITH PROPOFOL N/A 09/30/2016   Procedure: ESOPHAGOGASTRODUODENOSCOPY (EGD) WITH PROPOFOL;  Surgeon: Laurence Spates, MD;  Location: [facility];  Service: Endoscopy;  Laterality: N/A;  . LYMPH NODE DISSECTION Left 04/12/2013   Procedure: LYMPH NODE DISSECTION;  Surgeon: [name], MD;  Location: [place];  Service: Thoracic;  Laterality: Left;  . RIGHT/LEFT HEART CATH AND CORONARY  ANGIOGRAPHY N/A 10/08/2016   Procedure: RIGHT/LEFT HEART CATH AND CORONARY ANGIOGRAPHY;  Surgeon: Burnell Blanks, MD;  Location: [place] [facility];  Service: Cardiovascular;  Laterality: N/A;  . TONSILLECTOMY    . VESICOVAGINAL FISTULA CLOSURE W/ TAH  1971  . VIDEO ASSISTED THORACOSCOPY (VATS)/THOROCOTOMY Left 04/12/2013   Procedure: VIDEO ASSISTED THORACOSCOPY (VATS)/THOROCOTOMY, WITH LEFT UPPER LOBE WEDGE RESECTION, CHEST WALL BIOPSY ;  Surgeon: [name], MD;  Location: [place];  Service: Thoracic;  Laterality: Left;  Marland Kitchen VIDEO BRONCHOSCOPY N/A 04/12/2013   Procedure: VIDEO BRONCHOSCOPY;  Surgeon: [name], MD;  Location: [facility] OR;  Service: Thoracic;  Laterality: N/A;    Social History   Socioeconomic History  . Marital status: Widowed    Spouse name: Not on file  . Number of children: 2  . Years of education: Not on file  . Highest education level: Not on file  Occupational History  . Occupation: Nurse, adult: [employer]  Social Needs  . Financial resource strain: Not on file  . Food insecurity    Worry: Not on file    Inability: Not on file  . Transportation needs    Medical: Not on file    Non-medical: Not on file  Tobacco Use  . Smoking status: Former Smoker    Packs/day: 1.00    Years: 35.00    Pack years: 35.00    Types: Cigarettes    Quit date: 01/07/1988    Years since quitting: 30.9  . Smokeless tobacco: Never Used  Substance and Sexual Activity  . Alcohol use: No    Comment: not since 1988  . Drug use: No  . Sexual activity: Never  Lifestyle  . Physical activity    Days per week: Not on file    Minutes per session: Not on file  . Stress: Not on file  Relationships  . Social Herbalist on phone: Not on file    Gets together: Not on file    Attends religious service: Not on file    Active member of club or organization: Not on file    Attends meetings of clubs or organizations: Not on file    Relationship status: Not on file    . Intimate partner violence    Fear of current or ex partner: Not on file    Emotionally abused: Not on file    Physically abused: Not on file    Forced sexual activity: Not on file  Other Topics Concern  . Not on file  Social History Narrative  . Not on file    Family History  Problem Relation Age of Onset  . Lung cancer Mother   . Coronary artery disease Son   . Heart attack Son 54  . Arthritis/Rheumatoid Maternal Grandmother   . Coronary  artery disease Son   . Arthritis/Rheumatoid Son     ROS: Bilateral hand numbness but no fevers or chills, productive cough, hemoptysis, dysphasia, odynophagia, melena, hematochezia, dysuria, hematuria, rash, seizure activity, orthopnea, PND, pedal edema, claudication. Remaining systems are negative.  Physical Exam: Well-developed well-nourished in no acute distress.  Skin is warm and dry.  HEENT is normal.  Neck is supple.  Chest is clear to auscultation with normal expansion.  Cardiovascular exam is regular rate and rhythm.  Abdominal exam nontender or distended. No masses palpated. Extremities show no edema. neuro grossly intact  ECG-sinus bradycardia at a rate of 59, left bundle branch block.  Personally reviewed  A/P  1 nonischemic cardiomyopathy-plan to continue ARB (increase candesartan to 8 mg daily; bmet one week) and beta-blocker.  She declined Entresto and ICD previously.  She appears to be euvolemic on examination.  2 hypertension-blood pressure mildly elevated.  Increase candesartan to 8 mg daily and follow.  3 hyperlipidemia-continue statin.  4 history of nonobstructive coronary disease-continue aspirin and statin.  5 dyspnea-likely multifactorial including COPD, prior lung cancer with resection, obstructive sleep apnea and obesity hypoventilation syndrome.  At time of previous catheterization her filling pressures were normal.  Kirk Ruths, MD

## 2018-12-07 ENCOUNTER — Other Ambulatory Visit: Payer: Self-pay

## 2018-12-07 ENCOUNTER — Ambulatory Visit (INDEPENDENT_AMBULATORY_CARE_PROVIDER_SITE_OTHER): Payer: HMO | Admitting: Cardiology

## 2018-12-07 ENCOUNTER — Encounter: Payer: Self-pay | Admitting: Cardiology

## 2018-12-07 VITALS — BP 138/70 | HR 70 | Ht 62.0 in | Wt 178.0 lb

## 2018-12-07 DIAGNOSIS — I10 Essential (primary) hypertension: Secondary | ICD-10-CM

## 2018-12-07 DIAGNOSIS — E785 Hyperlipidemia, unspecified: Secondary | ICD-10-CM

## 2018-12-07 DIAGNOSIS — I428 Other cardiomyopathies: Secondary | ICD-10-CM | POA: Diagnosis not present

## 2018-12-07 DIAGNOSIS — I2510 Atherosclerotic heart disease of native coronary artery without angina pectoris: Secondary | ICD-10-CM | POA: Diagnosis not present

## 2018-12-07 MED ORDER — CANDESARTAN CILEXETIL 8 MG PO TABS: 8.0000 mg | ORAL_TABLET | Freq: Every day | ORAL | 3 refills | Status: DC

## 2018-12-07 NOTE — Patient Instructions (Signed)
Medication Instructions:  INCREASE CANDESARTAN TO 8 MG ONCE DAILY= 2 OF THE 4 MG TABLETS ONCE DAILY  *If you need a refill on your cardiac medications before your next appointment, please call your pharmacy*  Lab Work: Your physician recommends that you return for lab work in: [place]  If you have labs (blood work) drawn today and your tests are completely normal, you will receive your results only by: Marland Kitchen MyChart Message (if you have MyChart) OR . A paper copy in the mail If you have any lab test that is abnormal or we need to change your treatment, we will call you to review the results.  Follow-Up: At [facility], you and your health needs are our priority.  As part of our continuing mission to provide you with exceptional heart care, we have created designated Provider Care Teams.  These Care Teams include your primary Cardiologist (physician) and Advanced Practice Providers (APPs -  Physician Assistants and Nurse Practitioners) who all work together to provide you with the care you need, when you need it.  Your next appointment:   6 month(s)  The format for your next appointment:   Either In Person or Virtual  Provider:   You may see Kirk Ruths MD or one of the following Advanced Practice Providers on your designated Care Team:    Kerin Ransom, PA-C  [place], [place]  Coletta Memos, [place]

## 2018-12-14 ENCOUNTER — Other Ambulatory Visit: Payer: Self-pay

## 2018-12-14 ENCOUNTER — Ambulatory Visit (INDEPENDENT_AMBULATORY_CARE_PROVIDER_SITE_OTHER): Payer: HMO | Admitting: Allergy and Immunology

## 2018-12-14 ENCOUNTER — Encounter: Payer: Self-pay | Admitting: Allergy and Immunology

## 2018-12-14 VITALS — BP 142/80 | HR 71 | Temp 97.2°F | Resp 16

## 2018-12-14 DIAGNOSIS — J4489 Other specified chronic obstructive pulmonary disease: Secondary | ICD-10-CM

## 2018-12-14 DIAGNOSIS — K219 Gastro-esophageal reflux disease without esophagitis: Secondary | ICD-10-CM

## 2018-12-14 DIAGNOSIS — R197 Diarrhea, unspecified: Secondary | ICD-10-CM

## 2018-12-14 DIAGNOSIS — J449 Chronic obstructive pulmonary disease, unspecified: Secondary | ICD-10-CM | POA: Diagnosis not present

## 2018-12-14 DIAGNOSIS — J3089 Other allergic rhinitis: Secondary | ICD-10-CM

## 2018-12-14 MED ORDER — TRELEGY ELLIPTA 100-62.5-25 MCG/INH IN AEPB: 1.0000 | INHALATION_SPRAY | Freq: Every day | RESPIRATORY_TRACT | 5 refills | Status: DC

## 2018-12-14 MED ORDER — TRELEGY ELLIPTA 100-62.5-25 MCG/INH IN AEPB: 1.0000 | INHALATION_SPRAY | Freq: Every day | RESPIRATORY_TRACT | 6 refills | Status: DC

## 2018-12-14 NOTE — Patient Instructions (Signed)
  1.  Continue to treat reflux and LPR with the following combination:   A. Prevacid 30mg  1 time per day in AM  B. Famotidine 40mg  1 time per day in PM  2.  Continue Colestipol 1 g tablet 1-2 times a day for diarrhea  3.  Continue flonase -1-2 sprays each nostril 1 time per day  4.  Continue Trelegy 100 - 1 inhalation 1 time per day  5.  Can use pro-air HFA 2 inhalations every 4-6 hours.  6.  Return to clinic in 6 months or earlier if problem  7. Obtain Covid vaccine when available

## 2018-12-14 NOTE — Progress Notes (Signed)
[place]   Follow-up Note  Referring Provider: Kelton Pillar, MD Primary Provider: Kelton Pillar, MD Date of Office Visit: 12/14/2018  Subjective:   Taylor Berry (DOB: 1940/02/16) is a 78 y.o. female who returns to the [facility] on 12/14/2018 in re-evaluation of the following:  HPI: Griselle returns to this clinic in evaluation of COPD with asthma, allergic rhinitis, LPR, history of colestipol responsive diarrhea, and a distant history of lip swelling.  Her last visit to this clinic was 15 June 2018.  Her airway has done wonderful since her last visit.  She has not required a systemic steroid or antibiotic to treat any type of airway issue.  She rarely uses a short acting bronchodilator.  Her nose is really doing very well.  She continues to use Trelegy and a nasal steroid and montelukast on a consistent basis.  Her reflux and throat issue is really doing very well at this point in time while consistently utilizing therapy directed against reflux including a combination of a proton pump inhibitor and H2 receptor blocker.  She has had very little problems with her diarrhea while using colestipol mostly 1 time per day.  She did obtain the flu vaccine this year.  Allergies as of 12/14/2018      Reactions   Latex    REACTION: itching   Ace Inhibitors    REACTION: cough   Fentanyl    Hallucination "I go crazy"    Hydrochlorothiazide    REACTION: decreased sodium   Penicillins Other (See Comments)   REACTION: intolerant due to yeast infection Has patient had a PCN reaction causing immediate rash, facial/tongue/throat swelling, SOB or lightheadedness with hypotension:No Has patient had a PCN reaction causing severe rash involving mucus membranes or skin necrosis: No Has patient had a PCN reaction that required hospitalization No Has patient had a PCN reaction occurring within the last 10 years: Yes If all of the  above answers are "NO", then may proceed with Cephalosporin use.      Medication List    albuterol 108 (90 Base) MCG/ACT inhaler Commonly known as: VENTOLIN HFA INHALE 2 PUFFS BY MOUTH IN EACH NOSTRIL EVERY 4 TO 6 HOURS AS NEEDED   Aspirin 81 81 MG EC tablet Generic drug: aspirin Take 81 mg by mouth daily.   atorvastatin 20 MG tablet Commonly known as: LIPITOR TAKE 1 TABLET(20 MG) BY MOUTH DAILY   candesartan 8 MG tablet Commonly known as: ATACAND Take 1 tablet (8 mg total) by mouth daily.   colestipol 1 g tablet Commonly known as: COLESTID 1 tablet by mouth 1-2 times daily as needed for diarrhea   famotidine 40 MG tablet Commonly known as: PEPCID Take 1 tablet (40 mg total) by mouth every evening.   fluticasone 50 MCG/ACT nasal spray Commonly known as: FLONASE Use 1-2 sprays per nostril daily   hydroxychloroquine 200 MG tablet Commonly known as: PLAQUENIL   lansoprazole 30 MG capsule Commonly known as: PREVACID Take 1 capsule daily in the morning   leflunomide 20 MG tablet Commonly known as: ARAVA Take 20 mg by mouth daily.   metoprolol succinate 50 MG 24 hr tablet Commonly known as: TOPROL-XL TAKE 1 TABLET(50 MG) BY MOUTH DAILY   Trelegy Ellipta 100-62.5-25 MCG/INH Aepb Generic drug: Fluticasone-Umeclidin-Vilant Inhale 1 puff into the lungs daily.   venlafaxine XR 75 MG 24 hr capsule Commonly known as: EFFEXOR-XR TK 1 C PO QD WF  Past Medical History:  Diagnosis Date  . Abnormal CT scan   . Allergic rhinitis   . Anemia 2015   after lung surgery  . Angio-edema   . Complication of anesthesia    Fentanyl allergy, "claustrophobia"  . COPD (chronic obstructive pulmonary disease) ([place])   . Depression   . Diaphoresis 07/30/2015   Excessive sweating with little exertion  . Difficult intravenous access   . GERD (gastroesophageal reflux disease)    In the past  . Hyperlipidemia   . Hypertension   . Left bundle branch block   . Lung cancer  ([place]) 2015   left upper lobe wedge removed-no further tx.  . Neuropathy    Tingling in arms, fingers, and feet (Since 06/15/16)  . Obesity   . OSA (obstructive sleep apnea)    denies.  . Osteoarthritis    ra also  . Osteoporosis   . Recurrent upper respiratory infection (URI)   . Rheumatoid arthritis(714.0)    Dr. Stark Jock follows  . SOB (shortness of breath) on exertion   . Uterine cancer ([place]) 1971   tx surgical    Past Surgical History:  Procedure Laterality Date  . ABDOMINAL HYSTERECTOMY     in situ carcinoma partial  . CARDIAC CATHETERIZATION  01/27/11   minor non-obs CAD, NL EF, mild pulm HTN  . CATARACT EXTRACTION, BILATERAL     last done 12'16-  . COLONOSCOPY W/ BIOPSIES AND POLYPECTOMY    . COLONOSCOPY WITH PROPOFOL N/A 02/13/2015   Procedure: COLONOSCOPY WITH PROPOFOL;  Surgeon: Laurence Spates, MD;  Location: [facility];  Service: Endoscopy;  Laterality: N/A;  . ESOPHAGOGASTRODUODENOSCOPY (EGD) WITH PROPOFOL N/A 09/30/2016   Procedure: ESOPHAGOGASTRODUODENOSCOPY (EGD) WITH PROPOFOL;  Surgeon: Laurence Spates, MD;  [facility];  Service: Endoscopy;  Laterality: N/A;  . LYMPH NODE DISSECTION Left 04/12/2013   Procedure: LYMPH NODE DISSECTION;  Surgeon: [name], MD;  Location: [facility];  Service: Thoracic;  Laterality: Left;  . RIGHT/LEFT HEART CATH AND CORONARY ANGIOGRAPHY N/A 10/08/2016   Procedure: RIGHT/LEFT HEART CATH AND CORONARY ANGIOGRAPHY;  Surgeon: Burnell Blanks, MD;  Location: [facility];  Service: Cardiovascular;  Laterality: N/A;  . TONSILLECTOMY    . VESICOVAGINAL FISTULA CLOSURE W/ TAH  1971  . VIDEO ASSISTED THORACOSCOPY (VATS)/THOROCOTOMY Left 04/12/2013   Procedure: VIDEO ASSISTED THORACOSCOPY (VATS)/THOROCOTOMY, WITH LEFT UPPER LOBE WEDGE RESECTION, CHEST WALL BIOPSY ;  Surgeon: [name], MD;  Location: [facility];  Service: Thoracic;  Laterality: Left;  Marland Kitchen VIDEO BRONCHOSCOPY N/A 04/12/2013   Procedure: VIDEO BRONCHOSCOPY;   Surgeon: [name], MD;  Location: [facility] OR;  Service: Thoracic;  Laterality: N/A;    Review of systems negative except as noted in HPI / PMHx or noted below:  Review of Systems  Constitutional: Negative.   HENT: Negative.   Eyes: Negative.   Respiratory: Negative.   Cardiovascular: Negative.   Gastrointestinal: Negative.   Genitourinary: Negative.   Musculoskeletal: Negative.   Skin: Negative.   Neurological: Negative.   Endo/Heme/Allergies: Negative.   Psychiatric/Behavioral: Negative.      Objective:   Vitals:   12/14/18 1037  BP: (!) 142/80  Pulse: 71  Resp: 16  Temp: (!) 97.2 F (36.2 C)  SpO2: 94%          Physical Exam Constitutional:      Appearance: She is not diaphoretic.  HENT:     Head: Normocephalic.     Right Ear: Tympanic membrane, ear canal and external  ear normal.     Left Ear: Tympanic membrane, ear canal and external ear normal.     Nose: Nose normal. No mucosal edema or rhinorrhea.     Mouth/Throat:     Pharynx: Uvula midline. No oropharyngeal exudate.  Eyes:     Conjunctiva/sclera: Conjunctivae normal.  Neck:     Thyroid: No thyromegaly.     Trachea: Trachea normal. No tracheal tenderness or tracheal deviation.  Cardiovascular:     Rate and Rhythm: Normal rate and regular rhythm.     Heart sounds: S1 normal and S2 normal. Murmur (Systolic) present.  Pulmonary:     Effort: No respiratory distress.     Breath sounds: Normal breath sounds. No stridor. No wheezing (Inspiratory crackles posterior lung bases bilaterally) or rales.  Lymphadenopathy:     Head:     Right side of head: No tonsillar adenopathy.     Left side of head: No tonsillar adenopathy.     Cervical: No cervical adenopathy.  Skin:    Findings: No erythema or rash.     Nails: There is no clubbing.   Neurological:     Mental Status: She is alert.     Diagnostics:    Spirometry was performed and demonstrated an FEV1 of 1.12 at 62 % of predicted.  Assessment  and Plan:   1. COPD with asthma ([place])   2. Perennial allergic rhinitis   3. LPRD (laryngopharyngeal reflux disease)   4. Diarrhea, unspecified type     1.  Continue to treat reflux and LPR with the following combination:   A. Prevacid '30mg'$  1 time per day in AM  B. Famotidine '40mg'$  1 time per day in PM  2.  Continue Colestipol 1 g tablet 1-2 times a day for diarrhea  3.  Continue flonase -1-2 sprays each nostril 1 time per day  4.  Continue Trelegy 100 - 1 inhalation 1 time per day  5.  Can use pro-air HFA 2 inhalations every 4-6 hours.  6.  Return to clinic in 6 months or earlier if problem  7. Obtain Covid vaccine when available  Rily is really doing very well and she will continue on a combination of therapy directed against respiratory tract inflammation and reflux as noted above.  She will continue on her colestipol for her chronic diarrhea which is working very well.  She does appear to have some inspiratory crackles on posterior bases but there is no other evidence of heart failure at this point I have encouraged her to obtain the Covid vaccine when available.  I will see her back in his clinic in 6 months or earlier if there is a problem.  Allena Katz, MD Allergy / Immunology [place]

## 2018-12-15 ENCOUNTER — Encounter: Payer: Self-pay | Admitting: Allergy and Immunology

## 2018-12-20 ENCOUNTER — Other Ambulatory Visit: Payer: Self-pay

## 2018-12-20 DIAGNOSIS — I428 Other cardiomyopathies: Secondary | ICD-10-CM | POA: Diagnosis not present

## 2018-12-20 DIAGNOSIS — I10 Essential (primary) hypertension: Secondary | ICD-10-CM | POA: Diagnosis not present

## 2018-12-20 NOTE — Patient Outreach (Addendum)
  [facility]) Care Management Chronic Special Needs Program  12/20/2018  Name: Taylor Berry DOB: Aug 21, 1940  MRN: [id number]  Ms. Taylor Berry is enrolled in a chronic special needs plan. Reviewed and updated care plan.  Subjective: Client reports she is doing well. She reports she saw cardiologist this month and blood pressure medication increased. She states she does not weigh self daily and states, "I've been slack", but denies any signs of exacerbation. "I know all the things I am supposed to be doing" and reports she has not been able to get out like she used to due to "Covid". However client states she has been getting some exercise, watches her salt intake and take her medications as prescribed. Client states she is completing the assistance forms for next year and will mail them to the [facility] Pharmacist for next years assistance program. Client denies any questions or concerns about her health condition at this time.  Client with questions regarding dental benefits.  Goals Addressed            This Visit's Progress   . COMPLETED: Client verbalize knowledge of Heart Failure diease self management skills within the next 6-9 months.       Takes medications as prescribed; monitors salt intake; Follows up with providers, knows who to call and when to call.    . COMPLETED: Client will report no worsening of symptoms related to heart disease within the next 6-9 months        Denies any symptoms.    . Client will report weighing and recording weights daily within the next 3 months.   Not on track    To get the most accurate weights: weigh in the morning; after going to the bathroom and before you eat, In the same type of clothing.    . COMPLETED: Client will verbalize knowledge of chronic lung disease as evidenced by no ED visits or Inpatient stays related to chronic lung disease        No Emergency room visit or inpatient stays.    . COMPLETED: Maintain timely  refills of Heart Failure medication as prescribed within the year        Denies difficulty obtaining heart failure medications.    . COMPLETED: Obtain annual  Lipid Profile, LDL-C       Done 06/29/2018    . COMPLETED: Visit Primary Care Provider or Cardiologist at least 2 times per year       Has seen provider at least twice this year.      Discussed signs/symptoms of heart failure exacerbations, discussed the importance of daily weights in how it can help with identifying fluid retention. Medications reviewed.  Covid 19 precautions discussed. RNCM encouraged client to call healthcare concierge for benefits questions. RNCM encouraged client to call RNCM as needed.  Plan: RNCM will send individualized care plan to client; send individualized care plan to primary care. RNCM will outreach within 6 months. Care coordination with healthcare concierge - requested someone call client regarding her benefit question.    Thea Silversmith, RN, MSN, [place] [phone]    .

## 2018-12-21 DIAGNOSIS — M810 Age-related osteoporosis without current pathological fracture: Secondary | ICD-10-CM | POA: Diagnosis not present

## 2018-12-21 DIAGNOSIS — Z79899 Other long term (current) drug therapy: Secondary | ICD-10-CM | POA: Diagnosis not present

## 2018-12-21 DIAGNOSIS — R682 Dry mouth, unspecified: Secondary | ICD-10-CM | POA: Diagnosis not present

## 2018-12-21 DIAGNOSIS — M0589 Other rheumatoid arthritis with rheumatoid factor of multiple sites: Secondary | ICD-10-CM | POA: Diagnosis not present

## 2018-12-21 DIAGNOSIS — C3490 Malignant neoplasm of unspecified part of unspecified bronchus or lung: Secondary | ICD-10-CM | POA: Diagnosis not present

## 2018-12-21 DIAGNOSIS — R0602 Shortness of breath: Secondary | ICD-10-CM | POA: Diagnosis not present

## 2018-12-21 DIAGNOSIS — M150 Primary generalized (osteo)arthritis: Secondary | ICD-10-CM | POA: Diagnosis not present

## 2018-12-21 DIAGNOSIS — E785 Hyperlipidemia, unspecified: Secondary | ICD-10-CM | POA: Diagnosis not present

## 2018-12-21 DIAGNOSIS — R29898 Other symptoms and signs involving the musculoskeletal system: Secondary | ICD-10-CM | POA: Diagnosis not present

## 2018-12-21 DIAGNOSIS — R232 Flushing: Secondary | ICD-10-CM | POA: Diagnosis not present

## 2018-12-21 LAB — BASIC METABOLIC PANEL
BUN/Creatinine Ratio: 23 (ref 12–28)
BUN: 17 mg/dL (ref 8–27)
CO2: 24 mmol/L (ref 20–29)
Calcium: 9.6 mg/dL (ref 8.7–10.3)
Chloride: 102 mmol/L (ref 96–106)
Creatinine, Ser: 0.74 mg/dL (ref 0.57–1.00)
GFR calc Af Amer: 90 mL/min/{1.73_m2} (ref >59)
GFR calc non Af Amer: 78 mL/min/{1.73_m2} (ref >59)
Glucose: 82 mg/dL (ref 65–99)
Potassium: 4.7 mmol/L (ref 3.5–5.2)
Sodium: 138 mmol/L (ref 134–144)

## 2019-01-11 ENCOUNTER — Other Ambulatory Visit: Payer: Self-pay | Admitting: Pharmacy Technician

## 2019-01-11 NOTE — Patient Outreach (Signed)
[facility]) Care Management  01/11/2019  Taylor Berry 12-22-1940 [id number]    Received both patient and provider portion(s) of patient assistance application(s) for Trelegy. Faxed completed application and required documents into [employer]  Note:patient has not met the required $600 for the 2021 calendar year for [employer] but patient requested that we fax the information into the company anyways  Will follow up with company(ies) in 2-3 business days to check status of application(s).  Taylor Berry, [place] Management [phone]

## 2019-01-14 ENCOUNTER — Other Ambulatory Visit: Payer: Self-pay | Admitting: Pharmacy Technician

## 2019-01-14 NOTE — Patient Outreach (Signed)
[facility]) Care Management  01/14/2019  Taylor Berry Apr 11, 1940 [id number]   Care coordination call placed to [place] in regards to patient's application for Trelegy.  Spoke to Hilda Blades who informed patient was APPROVED 01/12/2019-01/06/2020. She informed patient's last refill was shipped 11/30/2018 and next refill was due around 03/02/2019.  Will route note to [place] that patient assistance has been completed and will remove myself from care team.  [name]. Cathlin Buchan, [place] Management [phone]

## 2019-01-17 ENCOUNTER — Telehealth: Payer: Self-pay | Admitting: Pharmacist

## 2019-01-17 NOTE — Patient Outreach (Signed)
[facility]) Care Management  01/17/2019  Taylor Berry [birth year] [id number]   Patient was called to let her know she was approved to receive Trelegy from [employer] through 01/06/2019.  HIPAA identifiers were obtained.  In addition, patient was given the phone number to [place] to call when she needs to reorder: [phone].  Patient communicated understanding.  Plan: Call patient back in 3-4 months for CNSP follow up.  [name], PharmD, [place] Clinical Pharmacist [phone]

## 2019-01-22 ENCOUNTER — Other Ambulatory Visit: Payer: Self-pay | Admitting: Allergy and Immunology

## 2019-02-02 ENCOUNTER — Ambulatory Visit: Payer: Self-pay | Admitting: Pharmacist

## 2019-02-24 ENCOUNTER — Ambulatory Visit: Payer: HMO | Admitting: Pharmacist

## 2019-03-22 DIAGNOSIS — M150 Primary generalized (osteo)arthritis: Secondary | ICD-10-CM | POA: Diagnosis not present

## 2019-03-22 DIAGNOSIS — M0589 Other rheumatoid arthritis with rheumatoid factor of multiple sites: Secondary | ICD-10-CM | POA: Diagnosis not present

## 2019-03-22 DIAGNOSIS — R0602 Shortness of breath: Secondary | ICD-10-CM | POA: Diagnosis not present

## 2019-03-22 DIAGNOSIS — R29898 Other symptoms and signs involving the musculoskeletal system: Secondary | ICD-10-CM | POA: Diagnosis not present

## 2019-03-22 DIAGNOSIS — Z79899 Other long term (current) drug therapy: Secondary | ICD-10-CM | POA: Diagnosis not present

## 2019-03-22 DIAGNOSIS — R682 Dry mouth, unspecified: Secondary | ICD-10-CM | POA: Diagnosis not present

## 2019-03-22 DIAGNOSIS — E785 Hyperlipidemia, unspecified: Secondary | ICD-10-CM | POA: Diagnosis not present

## 2019-03-22 DIAGNOSIS — C3490 Malignant neoplasm of unspecified part of unspecified bronchus or lung: Secondary | ICD-10-CM | POA: Diagnosis not present

## 2019-03-22 DIAGNOSIS — M810 Age-related osteoporosis without current pathological fracture: Secondary | ICD-10-CM | POA: Diagnosis not present

## 2019-03-22 DIAGNOSIS — E669 Obesity, unspecified: Secondary | ICD-10-CM | POA: Diagnosis not present

## 2019-03-22 DIAGNOSIS — R232 Flushing: Secondary | ICD-10-CM | POA: Diagnosis not present

## 2019-03-23 DIAGNOSIS — M810 Age-related osteoporosis without current pathological fracture: Secondary | ICD-10-CM | POA: Diagnosis not present

## 2019-04-13 ENCOUNTER — Telehealth: Payer: Self-pay | Admitting: Cardiology

## 2019-04-13 NOTE — Telephone Encounter (Signed)
I spoke with patient. She has had swelling in feet and ankles for last 10 days. Present all day long. Now has numbness in 4 fingers in right hand due to tightness and swelling. No recent diet changes. Trying to follow a healthy diet.  Does not weigh daily. Restarted Prolia about 2 weeks ago. No shortness of breath.  I scheduled patient for my chart video visit with LDyann Kief tomorrow. Patient will check BP, weigh and check oxygen sat prior to visit

## 2019-04-13 NOTE — Telephone Encounter (Signed)
  Pt c/o swelling: STAT is pt has developed SOB within 24 hours  1) How much weight have you gained and in what time span? 10-15 lbs in a year  2) If swelling, where is the swelling located? feet and hands, right hand fingers are numb  3) Are you currently taking a fluid pill? no  4) Are you currently SOB? no  5) Do you have a log of your daily weights (if so, list)? no  6) Have you gained 3 pounds in a day or 5 pounds in a week? no  7) Have you traveled recently? No   Patient states for 10 days she has been having swelling in her feet and hands. She states she is now also having numbness in the fingers on her right hand. She states she is not sure it is because she started Prolia again 3 weeks ago. She states she is also having an ache in her ear or tooth at night. Please advise.

## 2019-04-14 ENCOUNTER — Telehealth (INDEPENDENT_AMBULATORY_CARE_PROVIDER_SITE_OTHER): Payer: HMO | Admitting: Cardiology

## 2019-04-14 ENCOUNTER — Other Ambulatory Visit: Payer: Self-pay | Admitting: Cardiology

## 2019-04-14 ENCOUNTER — Encounter: Payer: Self-pay | Admitting: Cardiology

## 2019-04-14 VITALS — BP 132/76 | HR 57 | Ht 62.0 in | Wt 174.0 lb

## 2019-04-14 DIAGNOSIS — M069 Rheumatoid arthritis, unspecified: Secondary | ICD-10-CM | POA: Diagnosis not present

## 2019-04-14 DIAGNOSIS — R609 Edema, unspecified: Secondary | ICD-10-CM | POA: Diagnosis not present

## 2019-04-14 DIAGNOSIS — E7800 Pure hypercholesterolemia, unspecified: Secondary | ICD-10-CM | POA: Diagnosis not present

## 2019-04-14 DIAGNOSIS — C3412 Malignant neoplasm of upper lobe, left bronchus or lung: Secondary | ICD-10-CM | POA: Diagnosis not present

## 2019-04-14 DIAGNOSIS — J449 Chronic obstructive pulmonary disease, unspecified: Secondary | ICD-10-CM | POA: Diagnosis not present

## 2019-04-14 DIAGNOSIS — I428 Other cardiomyopathies: Secondary | ICD-10-CM | POA: Diagnosis not present

## 2019-04-14 DIAGNOSIS — G4733 Obstructive sleep apnea (adult) (pediatric): Secondary | ICD-10-CM | POA: Diagnosis not present

## 2019-04-14 DIAGNOSIS — I10 Essential (primary) hypertension: Secondary | ICD-10-CM | POA: Diagnosis not present

## 2019-04-14 MED ORDER — FUROSEMIDE 20 MG PO TABS: ORAL_TABLET | ORAL | 0 refills | Status: DC

## 2019-04-14 NOTE — Progress Notes (Signed)
Virtual Visit via Video Note   This visit type was conducted due to national recommendations for restrictions regarding the COVID-19 Pandemic (e.g. social distancing) in an effort to limit this patient's exposure and mitigate transmission in our community.  Due to her co-morbid illnesses, this patient is at least at moderate risk for complications without adequate follow up.  This format is felt to be most appropriate for this patient at this time.  All issues noted in this document were discussed and addressed.  A limited physical exam was performed with this format.  Please refer to the patient's chart for her consent to telehealth for [facility].   The patient was identified using 2 identifiers.  Date:  04/14/2019   ID:  Taylor Berry, DOB 06/28/1940, MRN [id number]  Patient Location: Home Provider Location: Home  PCP:  Kelton Pillar, MD  Cardiologist:  Dr Stanford Breed Electrophysiologist:  None   Evaluation Performed:  Follow-Up Visit  Chief Complaint:  Swelling hands and feet  History of Present Illness:    Taylor Berry is a 79 y.o. female with a history of a nonischemic cardiomyopathy.  Patient had a normal echocardiogram in 2012.  In 2018 she had a catheterization that showed minor coronary disease.  Echocardiogram in March 2019 showed an ejection fraction of 30 to 35%.  The patient declined an ICD or Entresto therapy.  Other issues include COPD, history of lung cancer, sleep apnea, dyslipidemia, and rheumatoid arthritis.  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  She called the office with some complaints of swelling in her hands fingers and feet.  She says this has been going on for about 3 weeks.  She denies any orthopnea or increased dyspnea on exertion though she admits because of the weather she has not been exercising as she used to.  In the past she has had intolerance to HCTZ, she had diarrhea with this.  She does try and watch her salt intake.  She no  longer weighs herself daily.  She thinks she has gained about 10 pounds over the past year.  The patient does not have symptoms concerning for COVID-19 infection (fever, chills, cough, or new shortness of breath).    Past Medical History:  Diagnosis Date  . Abnormal CT scan   . Allergic rhinitis   . Anemia 2015   after lung surgery  . Angio-edema   . Complication of anesthesia    Fentanyl allergy, "claustrophobia"  . COPD (chronic obstructive pulmonary disease) ([place])   . Depression   . Diaphoresis 07/30/2015   Excessive sweating with little exertion  . Difficult intravenous access   . GERD (gastroesophageal reflux disease)    In the past  . Hyperlipidemia   . Hypertension   . Left bundle branch block   . Lung cancer ([place]) 2015   left upper lobe wedge removed-no further tx.  . Neuropathy    Tingling in arms, fingers, and feet (Since 06/15/16)  . Obesity   . OSA (obstructive sleep apnea)    denies.  . Osteoarthritis    ra also  . Osteoporosis   . Recurrent upper respiratory infection (URI)   . Rheumatoid arthritis(714.0)    Dr. Stark Jock follows  . SOB (shortness of breath) on exertion   . Uterine cancer ([place]) 1971   tx surgical   Past Surgical History:  Procedure Laterality Date  . ABDOMINAL HYSTERECTOMY     in situ carcinoma partial  . CARDIAC CATHETERIZATION  01/27/11  minor non-obs CAD, NL EF, mild pulm HTN  . CATARACT EXTRACTION, BILATERAL     last done 12'16-  . COLONOSCOPY W/ BIOPSIES AND POLYPECTOMY    . COLONOSCOPY WITH PROPOFOL N/A 02/13/2015   Procedure: COLONOSCOPY WITH PROPOFOL;  Surgeon: Laurence Spates, MD;  [facility];  Service: Endoscopy;  Laterality: N/A;  . ESOPHAGOGASTRODUODENOSCOPY (EGD) WITH PROPOFOL N/A 09/30/2016   Procedure: ESOPHAGOGASTRODUODENOSCOPY (EGD) WITH PROPOFOL;  Surgeon: Laurence Spates, MD;  [facility];  Service: Endoscopy;  Laterality: N/A;  . LYMPH NODE DISSECTION Left 04/12/2013   Procedure: LYMPH NODE  DISSECTION;  Surgeon: [name], MD;  Location: [facility];  Service: Thoracic;  Laterality: Left;  . RIGHT/LEFT HEART CATH AND CORONARY ANGIOGRAPHY N/A 10/08/2016   Procedure: RIGHT/LEFT HEART CATH AND CORONARY ANGIOGRAPHY;  Surgeon: Burnell Blanks, MD;  Location: [facility];  Service: Cardiovascular;  Laterality: N/A;  . TONSILLECTOMY    . VESICOVAGINAL FISTULA CLOSURE W/ TAH  1971  . VIDEO ASSISTED THORACOSCOPY (VATS)/THOROCOTOMY Left 04/12/2013   Procedure: VIDEO ASSISTED THORACOSCOPY (VATS)/THOROCOTOMY, WITH LEFT UPPER LOBE WEDGE RESECTION, CHEST WALL BIOPSY ;  Surgeon: [name], MD;  Location: [facility];  Service: Thoracic;  Laterality: Left;  Marland Kitchen VIDEO BRONCHOSCOPY N/A 04/12/2013   Procedure: VIDEO BRONCHOSCOPY;  Surgeon: [name], MD;  [facility];  Service: Thoracic;  Laterality: N/A;     Current Meds  Medication Sig  . albuterol (VENTOLIN HFA) 108 (90 Base) MCG/ACT inhaler INHALE 2 PUFFS BY MOUTH IN EACH NOSTRIL EVERY 4 TO 6 HOURS AS NEEDED  . aspirin (ASPIRIN 81) 81 MG EC tablet Take 81 mg by mouth daily.  Marland Kitchen atorvastatin (LIPITOR) 20 MG tablet TAKE 1 TABLET(20 MG) BY MOUTH DAILY  . candesartan (ATACAND) 8 MG tablet Take 1 tablet (8 mg total) by mouth daily.  . colestipol (COLESTID) 1 g tablet 1 tablet by mouth 1-2 times daily as needed for diarrhea  . denosumab (PROLIA) 60 MG/ML SOSY injection Inject 60 mg into the skin every 6 (six) months.  . famotidine (PEPCID) 40 MG tablet Take 1 tablet (40 mg total) by mouth every evening.  . fluticasone (FLONASE) 50 MCG/ACT nasal spray Use 1-2 sprays per nostril daily  . Fluticasone-Umeclidin-Vilant (TRELEGY ELLIPTA) 100-62.5-25 MCG/INH AEPB Inhale 1 puff into the lungs daily.  . lansoprazole (PREVACID) 30 MG capsule TAKE 1 CAPSULE BY MOUTH DAILY IN THE MORNING  . leflunomide (ARAVA) 20 MG tablet Take 20 mg by mouth daily.  . metoprolol succinate (TOPROL-XL) 50 MG 24 hr tablet TAKE 1 TABLET(50 MG) BY MOUTH DAILY   . venlafaxine XR (EFFEXOR-XR) 75 MG 24 hr capsule TK 1 C PO QD WF     Allergies:   Latex, Ace inhibitors, Fentanyl, Hydrochlorothiazide, and Penicillins   Social History   Tobacco Use  . Smoking status: Former Smoker    Packs/day: 1.00    Years: 35.00    Pack years: 35.00    Types: Cigarettes    Quit date: 01/07/1988    Years since quitting: 31.2  . Smokeless tobacco: Never Used  Substance Use Topics  . Alcohol use: No    Comment: not since 1988  . Drug use: No     Family Hx: The patient's family history includes Arthritis/Rheumatoid in her maternal grandmother and son; Coronary artery disease in her son and son; Heart attack (age of onset: 61) in her son; Lung cancer in her mother.  ROS:   Please see the history of present illness.  All other systems reviewed and are negative.   Prior CV studies:   The following studies were reviewed today: Echo 2019  Labs/Other Tests and Data Reviewed:    EKG:  An ECG dated 12/07/2018 was personally reviewed today and demonstrated:  NSR-SB 59, LBBB  Recent Labs: 12/20/2018: BUN 17; Creatinine, Ser 0.74; Potassium 4.7; Sodium 138   Recent Lipid Panel No results found for: CHOL, TRIG, HDL, CHOLHDL, LDLCALC, LDLDIRECT  Wt Readings from Last 3 Encounters:  04/14/19 174 lb (78.9 kg)  12/07/18 178 lb (80.7 kg)  09/21/18 172 lb (78 kg)     Objective:    Vital Signs:  BP 132/76   Pulse (!) 57   Ht 5\' 2"  (1.575 m)   Wt 174 lb (78.9 kg)   BMI 31.83 kg/m    VITAL SIGNS:  reviewed  ASSESSMENT & PLAN:    Edema- In reviewing her chart she has had some issues with edema in her ankles and feet in the past.  I am not sure the swelling in her hands and fingers is secondary to heart failure.  It is her main concern as it affects her violin playing.  NICM- EF in March 2019 was 30 to 35%.  She declined an ICD or Entresto therapy.  She tries to watch her salt.  She does admit that she constantly has a dry mouth and I suspect she  drinks a lot of fluids.  RA- Followed by rheumatologist  COPD- Followed by Dr Lamonte Sakai. He describes her COPD as severe.  She aslo has a history of lung cancer, s/p LUL wedge resection and untreated OSA.  She may have a component of Rt heart failure as well.   HTN- Fair control  Plan: Lasix 40 mg QD x 2 days, then 20 mg daily.  She will weigh her self daily.  F/U in office in two weeks- check BMP and consider a repeat echo at that time.   COVID-19 Education: The signs and symptoms of COVID-19 were discussed with the patient and how to seek care for testing (follow up with PCP or arrange E-visit).  The importance of social distancing was discussed today.  Time:   Today, I have spent 20 minutes with the patient with telehealth technology discussing the above problems.     Medication Adjustments/Labs and Tests Ordered: Current medicines are reviewed at length with the patient today.  Concerns regarding medicines are outlined above.   Tests Ordered: No orders of the defined types were placed in this encounter.   Medication Changes: No orders of the defined types were placed in this encounter.   Follow Up:  In Person two weeks with Dr Stanford Breed or myself.   Angelena Form, PA-C  04/14/2019 8:53 AM    [facility]

## 2019-04-14 NOTE — Patient Instructions (Signed)
Medication Instructions:   Your physician has recommended you make the following change in your medication:   1) Start Furosemide 20 mg. You will take 2 tablets (40 mg) for 2 days, then you will take 1 tablet by mouth once a day.  *If you need a refill on your cardiac medications before your next appointment, please call your pharmacy*  Lab Work:  None ordered today  Testing/Procedures:  None ordered today  Follow-Up: At [facility], you and your health needs are our priority.  As part of our continuing mission to provide you with exceptional heart care, we have created designated Provider Care Teams.  These Care Teams include your primary Cardiologist (physician) and Advanced Practice Providers (APPs -  Physician Assistants and Nurse Practitioners) who all work together to provide you with the care you need, when you need it.  We recommend signing up for the patient portal called "MyChart".  Sign up information is provided on this After Visit Summary.  MyChart is used to connect with patients for Virtual Visits (Telemedicine).  Patients are able to view lab/test results, encounter notes, upcoming appointments, etc.  Non-urgent messages can be sent to your provider as well.   To learn more about what you can do with MyChart, go to [email or web address].    Your next appointment:      Other Instructions  Weigh yourself daily and follow a low sodium diet.  Two Gram Sodium Diet 2000 mg  What is Sodium? Sodium is a mineral found naturally in many foods. The most significant source of sodium in the diet is table salt, which is about 40% sodium.  Processed, convenience, and preserved foods also contain a large amount of sodium.  The body needs only 500 mg of sodium daily to function,  A normal diet provides more than enough sodium even if you do not use salt.  Why Limit Sodium? A build up of sodium in the body can cause thirst, increased blood pressure, shortness of breath, and water  retention.  Decreasing sodium in the diet can reduce edema and risk of heart attack or stroke associated with high blood pressure.  Keep in mind that there are many other factors involved in these health problems.  Heredity, obesity, lack of exercise, cigarette smoking, stress and what you eat all play a role.  General Guidelines:  Do not add salt at the table or in cooking.  One teaspoon of salt contains over 2 grams of sodium.  Read food labels  Avoid processed and convenience foods  Ask your dietitian before eating any foods not dicussed in the menu planning guidelines  Consult your physician if you wish to use a salt substitute or a sodium containing medication such as antacids.  Limit milk and milk products to 16 oz (2 cups) per day.  Shopping Hints:  READ LABELS!! "Dietetic" does not necessarily mean low sodium.  Salt and other sodium ingredients are often added to foods during processing.   Menu Planning Guidelines Food Group Choose More Often Avoid  Beverages (see also the milk group All fruit juices, low-sodium, salt-free vegetables juices, low-sodium carbonated beverages Regular vegetable or tomato juices, commercially softened water used for drinking or cooking  Breads and Cereals Enriched white, wheat, rye and pumpernickel bread, hard rolls and dinner rolls; muffins, cornbread and waffles; most dry cereals, cooked cereal without added salt; unsalted crackers and breadsticks; low sodium or homemade bread crumbs Bread, rolls and crackers with salted tops; quick breads; instant hot cereals; pancakes; commercial  bread stuffing; self-rising flower and biscuit mixes; regular bread crumbs or cracker crumbs  Desserts and Sweets Desserts and sweets mad with mild should be within allowance Instant pudding mixes and cake mixes  Fats Butter or margarine; vegetable oils; unsalted salad dressings, regular salad dressings limited to 1 Tbs; light, sour and heavy cream Regular salad dressings  containing bacon fat, bacon bits, and salt pork; snack dips made with instant soup mixes or processed cheese; salted nuts  Fruits Most fresh, frozen and canned fruits Fruits processed with salt or sodium-containing ingredient (some dried fruits are processed with sodium sulfites        Vegetables Fresh, frozen vegetables and low- sodium canned vegetables Regular canned vegetables, sauerkraut, pickled vegetables, and others prepared in brine; frozen vegetables in sauces; vegetables seasoned with ham, bacon or salt pork  Condiments, Sauces, Miscellaneous  Salt substitute with physician's approval; pepper, herbs, spices; vinegar, lemon or lime juice; hot pepper sauce; garlic powder, onion powder, low sodium soy sauce (1 Tbs.); low sodium condiments (ketchup, chili sauce, mustard) in limited amounts (1 tsp.) fresh ground horseradish; unsalted tortilla chips, pretzels, potato chips, popcorn, salsa (1/4 cup) Any seasoning made with salt including garlic salt, celery salt, onion salt, and seasoned salt; sea salt, rock salt, kosher salt; meat tenderizers; monosodium glutamate; mustard, regular soy sauce, barbecue, sauce, chili sauce, teriyaki sauce, steak sauce, Worcestershire sauce, and most flavored vinegars; canned gravy and mixes; regular condiments; salted snack foods, olives, picles, relish, horseradish sauce, catsup   Food preparation: Try these seasonings Meats:    Pork Sage, onion Serve with applesauce  Chicken Poultry seasoning, thyme, parsley Serve with cranberry sauce  Lamb Curry powder, rosemary, garlic, thyme Serve with mint sauce or jelly  Veal Marjoram, basil Serve with current jelly, cranberry sauce  Beef Pepper, bay leaf Serve with dry mustard, unsalted chive butter  Fish Bay leaf, dill Serve with unsalted lemon butter, unsalted parsley butter  Vegetables:    Asparagus Lemon juice   Broccoli Lemon juice   Carrots Mustard dressing parsley, mint, nutmeg, glazed with unsalted butter and  sugar   Green beans Marjoram, lemon juice, nutmeg,dill seed   Tomatoes Basil, marjoram, onion   Spice /blend for [employer]" 4 tsp ground thyme 1 tsp ground sage 3 tsp ground rosemary 4 tsp ground marjoram   Test your knowledge 1. A product that says "Salt Free" may still contain sodium. True or False 2. Garlic Powder and Hot Pepper Sauce an be used as alternative seasonings.True or False 3. Processed foods have more sodium than fresh foods.  True or False 4. Canned Vegetables have less sodium than froze True or False  WAYS TO DECREASE YOUR SODIUM INTAKE 1. Avoid the use of added salt in cooking and at the table.  Table salt (and other prepared seasonings which contain salt) is probably one of the greatest sources of sodium in the diet.  Unsalted foods can gain flavor from the sweet, sour, and butter taste sensations of herbs and spices.  Instead of using salt for seasoning, try the following seasonings with the foods listed.  Remember: how you use them to enhance natural food flavors is limited only by your creativity... Allspice-Meat, fish, eggs, fruit, peas, red and yellow vegetables Almond Extract-Fruit baked goods Anise Seed-Sweet breads, fruit, carrots, beets, cottage cheese, cookies (tastes like licorice) Basil-Meat, fish, eggs, vegetables, rice, vegetables salads, soups, sauces Bay Leaf-Meat, fish, stews, poultry Burnet-Salad, vegetables (cucumber-like flavor) Caraway Seed-Bread, cookies, cottage cheese, meat, vegetables, cheese, rice Cardamon-Baked goods,  fruit, soups Celery Powder or seed-Salads, salad dressings, sauces, meatloaf, soup, bread.Do not use  celery salt Chervil-Meats, salads, fish, eggs, vegetables, cottage cheese (parsley-like flavor) Chili Power-Meatloaf, chicken cheese, corn, eggplant, egg dishes Chives-Salads cottage cheese, egg dishes, soups, vegetables, sauces Cilantro-Salsa, casseroles Cinnamon-Baked goods, fruit, pork, lamb, chicken, carrots Cloves-Fruit,  baked goods, fish, pot roast, green beans, beets, carrots Coriander-Pastry, cookies, meat, salads, cheese (lemon-orange flavor) Cumin-Meatloaf, fish,cheese, eggs, cabbage,fruit pie (caraway flavor) [employer], fruit, eggs, fish, poultry, cottage cheese, vegetables Dill Seed-Meat, cottage cheese, poultry, vegetables, fish, salads, bread Fennel Seed-Bread, cookies, apples, pork, eggs, fish, beets, cabbage, cheese, Licorice-like flavor Garlic-(buds or powder) Salads, meat, poultry, fish, bread, butter, vegetables, potatoes.Do not  use garlic salt Ginger-Fruit, vegetables, baked goods, meat, fish, poultry Horseradish Root-Meet, vegetables, butter Lemon Juice or Extract-Vegetables, fruit, tea, baked goods, fish salads Mace-Baked goods fruit, vegetables, fish, poultry (taste like nutmeg) Maple Extract-Syrups Marjoram-Meat, chicken, fish, vegetables, breads, green salads (taste like Sage) Mint-Tea, lamb, sherbet, vegetables, desserts, carrots, cabbage Mustard, Dry or Seed-Cheese, eggs, meats, vegetables, poultry Nutmeg-Baked goods, fruit, chicken, eggs, vegetables, desserts Onion Powder-Meat, fish, poultry, vegetables, cheese, eggs, bread, rice salads (Do not use   Onion salt) Orange Extract-Desserts, baked goods Oregano-Pasta, eggs, cheese, onions, pork, lamb, fish, chicken, vegetables, green salads Paprika-Meat, fish, poultry, eggs, cheese, vegetables Parsley Flakes-Butter, vegetables, meat fish, poultry, eggs, bread, salads (certain forms may   Contain sodium Pepper-Meat fish, poultry, vegetables, eggs Peppermint Extract-Desserts, baked goods Poppy Seed-Eggs, bread, cheese, fruit dressings, baked goods, noodles, vegetables, cottage  [employer], poultry, meat, fish, cauliflower, turnips,eggs bread Saffron-Rice, bread, veal, chicken, fish, eggs Sage-Meat, fish, poultry, onions, eggplant, tomateos, pork, stews Savory-Eggs, salads, poultry, meat,  rice, vegetables, soups, pork Tarragon-Meat, poultry, fish, eggs, butter, vegetables (licorice-like flavor)  Thyme-Meat, poultry, fish, eggs, vegetables, (clover-like flavor), sauces, soups Tumeric-Salads, butter, eggs, fish, rice, vegetables (saffron-like flavor) Vanilla Extract-Baked goods, candy Vinegar-Salads, vegetables, meat marinades Walnut Extract-baked goods, candy  2. Choose your Foods Wisely   The following is a list of foods to avoid which are high in sodium:  Meats-Avoid all smoked, canned, salt cured, dried and kosher meat and fish as well as Anchovies   [employer] meats:Bologna, Liverwurst, Pastrami Canned meat or fish  Marinated herring Caviar    Pepperoni Corned Beef   Pizza Dried chipped beef  Salami Frozen breaded fish or meat Salt pork Frankfurters or hot dogs  Sardines Gefilte fish   Sausage Ham (boiled ham, Proscuitto Smoked butt    spiced ham)   Spam      TV Dinners Vegetables Canned vegetables (Regular) Relish Canned mushrooms  Sauerkraut Olives    Tomato juice Pickles  Bakery and Dessert Products Canned puddings  Cream pies Cheesecake   Decorated cakes Cookies  Beverages/Juices Tomato juice, regular  Gatorade   V-8 vegetable juice, regular  Breads and Cereals Biscuit mixes   Salted potato chips, corn chips, pretzels Bread stuffing mixes  Salted crackers and rolls Pancake and waffle mixes Self-rising flour  Seasonings Accent    Meat sauces Barbecue sauce  Meat tenderizer Catsup    Monosodium glutamate (MSG) Celery salt   Onion salt Chili sauce   Prepared mustard Garlic salt   Salt, seasoned salt, sea salt Gravy mixes   Soy sauce Horseradish   Steak sauce Ketchup   Tartar sauce Lite salt    Teriyaki sauce Marinade mixes   Worcestershire sauce  Others Baking powder   Cocoa and cocoa mixes Baking soda  Commercial casserole mixes Candy-caramels, chocolate  Dehydrated soups    Bars, fudge,nougats  Instant rice and pasta  mixes Canned broth or soup  Maraschino cherries Cheese, aged and processed cheese and cheese spreads  Learning Assessment Quiz  Indicated T (for True) or F (for False) for each of the following statements:  1. _____ Fresh fruits and vegetables and unprocessed grains are generally low in sodium 2. _____ Water may contain a considerable amount of sodium, depending on the source 3. _____ You can always tell if a food is high in sodium by tasting it 4. _____ Certain laxatives my be high in sodium and should be avoided unless prescribed   by a physician or pharmacist 5. _____ Salt substitutes may be used freely by anyone on a sodium restricted diet 6. _____ Sodium is present in table salt, food additives and as a natural component of   most foods 7. _____ Table salt is approximately 90% sodium 8. _____ Limiting sodium intake may help prevent excess fluid accumulation in the body 9. _____ On a sodium-restricted diet, seasonings such as bouillon soy sauce, and    cooking wine should be used in place of table salt 10. _____ On an ingredient list, a product which lists monosodium glutamate as the first   ingredient is an appropriate food to include on a low sodium diet  Circle the best answer(s) to the following statements (Hint: there may be more than one correct answer)  11. On a low-sodium diet, some acceptable snack items are:    A. Olives  F. Bean dip   K. Grapefruit juice    B. Salted Pretzels G. Commercial Popcorn   L. Canned peaches    C. Carrot Sticks  H. Bouillon   M. Unsalted nuts   D. [place] fries  I. Peanut butter crackers N. Salami   E. Sweet pickles J. Tomato Juice   O. Pizza  12.  Seasonings that may be used freely on a reduced - sodium diet include   A. Lemon wedges F.Monosodium glutamate K. Celery seed    B.Soysauce   G. Pepper   L. Mustard powder   C. Sea salt  H. Cooking wine  M. Onion flakes   D. Vinegar  E. Prepared horseradish N. Salsa   E. Sage   J.  Worcestershire sauce  O. Chutney

## 2019-04-18 ENCOUNTER — Telehealth: Payer: Self-pay | Admitting: Cardiology

## 2019-04-18 NOTE — Telephone Encounter (Addendum)
Sent pt a follow up message through mychart regarding calling PCP which was Last read by Jinny Blossom at 3:51 PM on 04/18/2019.

## 2019-04-18 NOTE — Telephone Encounter (Signed)
Pt c/o medication issue:  1. Name of Medication: furosemide (LASIX) 20 MG tablet  2. How are you currently taking this medication (dosage and times per day)? Once daily  3. Are you having a reaction (difficulty breathing--STAT)?  yes  4. What is your medication issue? Kidney pain  Patient says this is the first time she experienced this type of pain

## 2019-04-18 NOTE — Telephone Encounter (Signed)
Spoke with patient of Dr. Stanford Breed. She started on lasix after 4/8 virtual visit with [facility] She reports new pain in her kidneys. She thinks she is having an adverse reaction to lasix. She reports UOP that is clear, no burning w/urination.   Advised will send message to [facility] to advise - labs needed?

## 2019-04-18 NOTE — Telephone Encounter (Signed)
Pt calling in with continued lower back pain. Stating that her fingers are going white and numb as well.  She states she is concerned that she is having an adverse reaction to the Lasix she was started on. She denies chest pain but states she has some SOB but that this is not uncommon for her.  Reviewed with Kerin Ransom PA who recommends she stop taking her lasix.  Notified pt of Luke's recommendation. She verbalized understanding but it questioning what she should do about the pain. Asked whether the pt takes OTC pain medication (tylenol or advil) and recommended that she take whichever  she normally takes  to see if this helps with the pain and to notify our office if she is still having trouble. Pt verbalized understanding with no other questions at this time

## 2019-04-20 DIAGNOSIS — M549 Dorsalgia, unspecified: Secondary | ICD-10-CM | POA: Diagnosis not present

## 2019-04-20 DIAGNOSIS — I110 Hypertensive heart disease with heart failure: Secondary | ICD-10-CM | POA: Diagnosis not present

## 2019-04-20 DIAGNOSIS — I10 Essential (primary) hypertension: Secondary | ICD-10-CM | POA: Diagnosis not present

## 2019-04-26 ENCOUNTER — Ambulatory Visit: Payer: HMO | Admitting: Emergency Medicine

## 2019-04-26 ENCOUNTER — Other Ambulatory Visit: Payer: Self-pay | Admitting: Pharmacist

## 2019-04-26 ENCOUNTER — Encounter: Payer: Self-pay | Admitting: Emergency Medicine

## 2019-04-26 ENCOUNTER — Other Ambulatory Visit: Payer: Self-pay

## 2019-04-26 DIAGNOSIS — R059 Cough, unspecified: Secondary | ICD-10-CM

## 2019-04-26 DIAGNOSIS — R05 Cough: Secondary | ICD-10-CM

## 2019-04-26 DIAGNOSIS — G4733 Obstructive sleep apnea (adult) (pediatric): Secondary | ICD-10-CM | POA: Diagnosis not present

## 2019-04-26 DIAGNOSIS — J449 Chronic obstructive pulmonary disease, unspecified: Secondary | ICD-10-CM | POA: Diagnosis not present

## 2019-04-26 DIAGNOSIS — R9389 Abnormal findings on diagnostic imaging of other specified body structures: Secondary | ICD-10-CM

## 2019-04-26 NOTE — Assessment & Plan Note (Signed)
Acknowledged that her OSA may be a contributor to right heart dysfunction, her edema.  She has tried to tolerate CPAP but has been unable.  We could consider again going forward pending on how problematic or edema, right heart component of her CHF become.

## 2019-04-26 NOTE — Progress Notes (Signed)
HPI:  ROV 09/21/2018 --Taylor Berry is 79, has a history of severe COPD and squamous cell lung cancer post left upper lobe wedge resection.  Also with rheumatoid arthritis on Arava, untreated OSA, CAD, uterine cancer.  We have been following a 1.8 x 1.0 cm subpleural right lower lobe nodular opacity on serial CT scans.  Her most recent was 08/12/2018 which I reviewed, shows that the right lower lobe nodule stable in size, 1.8 cm.  There are stable right apical micronodules present, unchanged chronic interstitial changes with associated bronchiectasis and tree-in-bud nodularity (in the setting of her RA).  Her COPD is currently managed on Trelegy, she uses albuterol approximately. She remains on fluticasone nasal spray, Pepcid. She has not been very active due to isolation efforts. She is not having any cough or sputum. No flares since. Pneumovax up to date. Needs her flu shot.   ROV 04/26/19 --this is a follow-up visit for Taylor Berry, 79 year old woman with a history of severe COPD, obesity with untreated OSA, rheumatoid arthritis, uterine cancer.  She had squamous cell lung cancer treated with a left upper lobe wedge resection. She also see Taylor Berry for CHF.  We have been following nodular disease by CT chest with slow increase in size in a medial right lower lobe nodule since CT chest 04/17/2016.  The nodule was stable in size between 01/21/2018 and 08/12/2018, 1.8 cm in maximum dimension. Currently managed on Trelegy.  She has albuterol, uses almost never.  Remains on Pepcid and prevacid, has run out of her fluticasone nasal spray She has been doing fairly well, has noticed difficulty with hills and carrying objects. Has not needed her albuterol. She has noticed some increased foot and hand edema. Had her lasix increased 4/8, hasn't reallyu noticed her swelling or breathing changed. No flares since I've seen her, last prednisone was over a year ago for her RA.   MDM:  Reviewed cardiology notes from  04/14/19 Reviewed Ct chest from 08/12/18, 01/21/18, 01/22/17   EXAM:  Vitals:   04/26/19 0957  BP: 124/68  Pulse: (!) 58  Temp: 97.8 F (36.6 C)  TempSrc: Temporal  SpO2: 98%  Weight: 117 lb (53.1 kg)  Height: 5\' 2"  (1.575 m)   Gen: Pleasant, obese, in no distress,  normal affect, in wheelchair   ENT: No lesions,  mouth clear, somewhat dry, oropharynx clear, no postnasal drip  Neck: No JVD, no stridor  Lungs: No use of accessory muscles, clear bilaterally, no wheeze.   Cardiovascular: RRR, heart sounds normal, no murmur or gallops, 1+ peripheral edema  Musculoskeletal: No deformities, no cyanosis or clubbing  Neuro: alert, non focal  Skin: Warm, no lesions or rashes     COPD (chronic obstructive pulmonary disease) Overall stable.  She does have some decreased exercise tolerance, notes that she has dropped off her exercise routine due to Covid isolation.  She is going to start an increase slowly.  We may get some benefit from considering pulmonary rehab at some point in the future.  Agree with trying to get back to your walking routine. Start back slowly.  Continue Trelegy 1 inhalation once daily.  Rinse and gargle after using. Keep your albuterol available to use 2 puffs if needed for shortness of breath, chest tightness, wheezing.Marland Kitchen COVID-19 vaccine is up-to-date Follow with Taylor Berry in August   OSA (obstructive sleep apnea) Acknowledged that her OSA may be a contributor to right heart dysfunction, her edema.  She has tried to tolerate CPAP but has  been unable.  We could consider again going forward pending on how problematic or edema, right heart component of her CHF become.  Abnormal CT of the chest Following a medial right lower lobe nodule, stable since 01/2018.  She is a repeat scan in August 2021.  Cough Working to control GERD, allergic rhinitis.  Continue Prevacid and Pepcid (famotidine) as you have been taking them. We will refill your fluticasone nasal spray,  use 2 sprays each nostril once daily during the allergy seasons  Baltazar Apo, MD, PhD 04/26/2019, 10:14 AM [facility] [phone] or if no answer [phone]

## 2019-04-26 NOTE — Assessment & Plan Note (Signed)
Overall stable.  She does have some decreased exercise tolerance, notes that she has dropped off her exercise routine due to Covid isolation.  She is going to start an increase slowly.  We may get some benefit from considering pulmonary rehab at some point in the future.  Agree with trying to get back to your walking routine. Start back slowly.  Continue Trelegy 1 inhalation once daily.  Rinse and gargle after using. Keep your albuterol available to use 2 puffs if needed for shortness of breath, chest tightness, wheezing.Marland Kitchen COVID-19 vaccine is up-to-date Follow with Dr Lamonte Sakai in August

## 2019-04-26 NOTE — Patient Instructions (Addendum)
We will plan to repeat your CT chest in August 2021.  Agree with trying to get back to your walking routine. Start back slowly.  Continue Trelegy 1 inhalation once daily.  Rinse and gargle after using. Keep your albuterol available to use 2 puffs if needed for shortness of breath, chest tightness, wheezing. Continue Prevacid and Pepcid (famotidine) as you have been taking them. We will refill your fluticasone nasal spray, use 2 sprays each nostril once daily during the allergy seasons. COVID-19 vaccine is up-to-date Follow with Dr Lamonte Sakai in August after your CT scan so that we can review the results together.

## 2019-04-26 NOTE — Patient Outreach (Signed)
[facility])  [place] Team    [facility] pharmacy case will be closed as our team is transitioning from the [employer] Management Department into the [facility] Quality Department and will no longer be using [facility] for documentation purposes.      [name], PharmD, [place] Clinical Pharmacist [phone]

## 2019-04-26 NOTE — Assessment & Plan Note (Signed)
Following a medial right lower lobe nodule, stable since 01/2018.  She is a repeat scan in August 2021.

## 2019-04-26 NOTE — Assessment & Plan Note (Signed)
Working to control GERD, allergic rhinitis.  Continue Prevacid and Pepcid (famotidine) as you have been taking them. We will refill your fluticasone nasal spray, use 2 sprays each nostril once daily during the allergy seasons

## 2019-04-28 ENCOUNTER — Ambulatory Visit (INDEPENDENT_AMBULATORY_CARE_PROVIDER_SITE_OTHER): Payer: HMO | Admitting: Cardiology

## 2019-04-28 ENCOUNTER — Encounter: Payer: Self-pay | Admitting: Cardiology

## 2019-04-28 ENCOUNTER — Other Ambulatory Visit: Payer: Self-pay

## 2019-04-28 VITALS — BP 148/92 | HR 57 | Ht 62.0 in | Wt 176.7 lb

## 2019-04-28 DIAGNOSIS — I2510 Atherosclerotic heart disease of native coronary artery without angina pectoris: Secondary | ICD-10-CM

## 2019-04-28 DIAGNOSIS — I428 Other cardiomyopathies: Secondary | ICD-10-CM | POA: Diagnosis not present

## 2019-04-28 DIAGNOSIS — I5043 Acute on chronic combined systolic (congestive) and diastolic (congestive) heart failure: Secondary | ICD-10-CM | POA: Diagnosis not present

## 2019-04-28 DIAGNOSIS — J449 Chronic obstructive pulmonary disease, unspecified: Secondary | ICD-10-CM

## 2019-04-28 DIAGNOSIS — M069 Rheumatoid arthritis, unspecified: Secondary | ICD-10-CM | POA: Diagnosis not present

## 2019-04-28 DIAGNOSIS — C3412 Malignant neoplasm of upper lobe, left bronchus or lung: Secondary | ICD-10-CM

## 2019-04-28 DIAGNOSIS — G4733 Obstructive sleep apnea (adult) (pediatric): Secondary | ICD-10-CM | POA: Diagnosis not present

## 2019-04-28 NOTE — Patient Instructions (Signed)
Medication Instructions:  NONE *If you need a refill on your cardiac medications before your next appointment, please call your pharmacy*   Lab Work: NONE If you have labs (blood work) drawn today and your tests are completely normal, you will receive your results only by: Marland Kitchen MyChart Message (if you have MyChart) OR . A paper copy in the mail If you have any lab test that is abnormal or we need to change your treatment, we will call you to review the results.   Testing/Procedures: NONE   Follow-Up: At [facility], you and your health needs are our priority.  As part of our continuing mission to provide you with exceptional heart care, we have created designated Provider Care Teams.  These Care Teams include your primary Cardiologist (physician) and Advanced Practice Providers (APPs -  Physician Assistants and Nurse Practitioners) who all work together to provide you with the care you need, when you need it.  We recommend signing up for the patient portal called "MyChart".  Sign up information is provided on this After Visit Summary.  MyChart is used to connect with patients for Virtual Visits (Telemedicine).  Patients are able to view lab/test results, encounter notes, upcoming appointments, etc.  Non-urgent messages can be sent to your provider as well.   To learn more about what you can do with MyChart, go to [email or web address].    kEEP SCHEDULED APPOINTMENT WITH DR. CRENSHAW IN MAY. Provider:   You may see Kirk Ruths, MD or one of the following Advanced Practice Providers on your designated Care Team:    Kerin Ransom, PA-C  [place], [place]  Coletta Memos, [place]

## 2019-04-28 NOTE — Assessment & Plan Note (Addendum)
S/P wedge resection-followed by Dr Lamonte Sakai

## 2019-04-28 NOTE — Assessment & Plan Note (Signed)
Mild volume overload- improved with lasix

## 2019-04-28 NOTE — Assessment & Plan Note (Signed)
Mild, non obstructive CAD at cath Oct 2018

## 2019-04-28 NOTE — Assessment & Plan Note (Signed)
EF 30-35% March 2019- pt declined ICD and [facility] Rx

## 2019-04-28 NOTE — Progress Notes (Signed)
Cardiology Office Note:    Date:  04/28/2019   ID:  Taylor Berry, MRN [id number]  PCP:  Kelton Pillar, MD  Cardiologist:  Kirk Ruths, MD  Electrophysiologist:  None   Referring MD: Kelton Pillar, MD   No chief complaint on file.   History of Present Illness:    Taylor Berry is a 79 y.o. female with a hx of a nonischemic cardiomyopathy.  Patient had a normal echocardiogram in 2012.  In 2018 she had a catheterization that showed minor coronary disease.  Echocardiogram in March 2019 showed an ejection fraction of 30 to 35%.  The patient declined an ICD or Entresto therapy 9she tells me she had some side effects from the [place]).  Other issues include COPD, history of lung cancer s/p wedge resection, sleep apnea, dyslipidemia, and rheumatoid arthritis.  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  She plays in concerts with the [facility] and with the [employer].  She called the office with some complaints of swelling in her hands fingers and feet.  She says this has been going on for about 3 weeks.  She denies any orthopnea or increased dyspnea on exertion though she admits because of the weather she has not been exercising as she used to.  In the past she has had intolerance to HCTZ, she had diarrhea with this.  She does try and watch her salt intake.  She no longer weighs herself daily.  She thinks she has gained about 10 pounds over the past year.  I saw her as a virtual visit 04/14/2019 and added low dose Lasix.  She is seen I the office today for follow up. Her LE edema is better, her hand edema has not changed much and is probable from her RA.    Past Medical History:  Diagnosis Date  . Abnormal CT scan   . Allergic rhinitis   . Anemia 2015   after lung surgery  . Angio-edema   . Complication of anesthesia    Fentanyl allergy, "claustrophobia"  . COPD (chronic obstructive pulmonary disease) ([place])   . Depression   . Diaphoresis 07/30/2015    Excessive sweating with little exertion  . Difficult intravenous access   . GERD (gastroesophageal reflux disease)    In the past  . Hyperlipidemia   . Hypertension   . Left bundle branch block   . Lung cancer ([place]) 2015   left upper lobe wedge removed-no further tx.  . Neuropathy    Tingling in arms, fingers, and feet (Since 06/15/16)  . Obesity   . OSA (obstructive sleep apnea)    denies.  . Osteoarthritis    ra also  . Osteoporosis   . Recurrent upper respiratory infection (URI)   . Rheumatoid arthritis(714.0)    Dr. Stark Jock follows  . SOB (shortness of breath) on exertion   . Uterine cancer ([name]) 1971   tx surgical    Past Surgical History:  Procedure Laterality Date  . ABDOMINAL HYSTERECTOMY     in situ carcinoma partial  . CARDIAC CATHETERIZATION  01/27/11   minor non-obs CAD, NL EF, mild pulm HTN  . CATARACT EXTRACTION, BILATERAL     last done 12'16-  . COLONOSCOPY W/ BIOPSIES AND POLYPECTOMY    . COLONOSCOPY WITH PROPOFOL N/A 02/13/2015   Procedure: COLONOSCOPY WITH PROPOFOL;  Surgeon: Laurence Spates, MD;  Location: [facility];  Service: Endoscopy;  Laterality: N/A;  . ESOPHAGOGASTRODUODENOSCOPY (EGD) WITH PROPOFOL N/A 09/30/2016   Procedure: ESOPHAGOGASTRODUODENOSCOPY (EGD)  WITH PROPOFOL;  Surgeon: Laurence Spates, MD;  Location: [facility];  Service: Endoscopy;  Laterality: N/A;  . LYMPH NODE DISSECTION Left 04/12/2013   Procedure: LYMPH NODE DISSECTION;  Surgeon: [name], MD;  Location: [place];  Service: Thoracic;  Laterality: Left;  . RIGHT/LEFT HEART CATH AND CORONARY ANGIOGRAPHY N/A 10/08/2016   Procedure: RIGHT/LEFT HEART CATH AND CORONARY ANGIOGRAPHY;  Surgeon: Burnell Blanks, MD;  Location: [facility];  Service: Cardiovascular;  Laterality: N/A;  . TONSILLECTOMY    . VESICOVAGINAL FISTULA CLOSURE W/ TAH  1971  . VIDEO ASSISTED THORACOSCOPY (VATS)/THOROCOTOMY Left 04/12/2013   Procedure: VIDEO ASSISTED THORACOSCOPY (VATS)/THOROCOTOMY,  WITH LEFT UPPER LOBE WEDGE RESECTION, CHEST WALL BIOPSY ;  Surgeon: [name], MD;  Location: [place];  Service: Thoracic;  Laterality: Left;  Marland Kitchen VIDEO BRONCHOSCOPY N/A 04/12/2013   Procedure: VIDEO BRONCHOSCOPY;  Surgeon: [name], MD;  Location: [facility] OR;  Service: Thoracic;  Laterality: N/A;    Current Medications: Current Meds  Medication Sig  . albuterol (VENTOLIN HFA) 108 (90 Base) MCG/ACT inhaler INHALE 2 PUFFS BY MOUTH IN EACH NOSTRIL EVERY 4 TO 6 HOURS AS NEEDED  . aspirin (ASPIRIN 81) 81 MG EC tablet Take 81 mg by mouth daily.  Marland Kitchen atorvastatin (LIPITOR) 20 MG tablet TAKE 1 TABLET(20 MG) BY MOUTH DAILY  . candesartan (ATACAND) 8 MG tablet Take 1 tablet (8 mg total) by mouth daily.  . colestipol (COLESTID) 1 g tablet 1 tablet by mouth 1-2 times daily as needed for diarrhea  . denosumab (PROLIA) 60 MG/ML SOSY injection Inject 60 mg into the skin every 6 (six) months.  . famotidine (PEPCID) 40 MG tablet Take 1 tablet (40 mg total) by mouth every evening.  . fluticasone (FLONASE) 50 MCG/ACT nasal spray Use 1-2 sprays per nostril daily  . Fluticasone-Umeclidin-Vilant (TRELEGY ELLIPTA) 100-62.5-25 MCG/INH AEPB Inhale 1 puff into the lungs daily.  . furosemide (LASIX) 20 MG tablet Take 2 tablets by mouth once a day for 2 days, then take 1 tablet by mouth once a day  . lansoprazole (PREVACID) 30 MG capsule TAKE 1 CAPSULE BY MOUTH DAILY IN THE MORNING  . leflunomide (ARAVA) 20 MG tablet Take 20 mg by mouth daily.  . metoprolol succinate (TOPROL-XL) 50 MG 24 hr tablet TAKE 1 TABLET(50 MG) BY MOUTH DAILY  . venlafaxine XR (EFFEXOR-XR) 75 MG 24 hr capsule TK 1 C PO QD WF     Allergies:   Latex, Ace inhibitors, Fentanyl, Hydrochlorothiazide, and Penicillins   Social History   Socioeconomic History  . Marital status: Widowed    Spouse name: Not on file  . Number of children: 2  . Years of education: Not on file  . Highest education level: Not on file  Occupational History  .  Occupation: Nurse, adult: [employer]  Tobacco Use  . Smoking status: Former Smoker    Packs/day: 1.00    Years: 35.00    Pack years: 35.00    Types: Cigarettes    Quit date: 01/07/1988    Years since quitting: 31.3  . Smokeless tobacco: Never Used  Substance and Sexual Activity  . Alcohol use: No    Comment: not since 1988  . Drug use: No  . Sexual activity: Never  Other Topics Concern  . Not on file  Social History Narrative  . Not on file   Social Determinants of Health   Financial Resource Strain:   . Difficulty of Paying Living Expenses:  Food Insecurity:   . Worried About Charity fundraiser in the Last Year:   . Arboriculturist in the Last Year:   Transportation Needs:   . Film/video editor (Medical):   Marland Kitchen Lack of Transportation (Non-Medical):   Physical Activity:   . Days of Exercise per Week:   . Minutes of Exercise per Session:   Stress:   . Feeling of Stress :   Social Connections:   . Frequency of Communication with Friends and Family:   . Frequency of Social Gatherings with Friends and Family:   . Attends Religious Services:   . Active Member of Clubs or Organizations:   . Attends Archivist Meetings:   Marland Kitchen Marital Status:      Family History: The patient's family history includes Arthritis/Rheumatoid in her maternal grandmother and son; Coronary artery disease in her son and son; Heart attack (age of onset: 73) in her son; Lung cancer in her mother.  ROS:   Please see the history of present illness.  Urinary incontinence     All other systems reviewed and are negative.  EKGs/Labs/Other Studies Reviewed:    The following studies were reviewed today: Echo 03/10/2017  EKG:  EKG is not ordered today.  The ekg ordered 12/07/2018 demonstrates NSR- HR 59, LBBB  Recent Labs: 12/20/2018: BUN 17; Creatinine, Ser 0.74; Potassium 4.7; Sodium 138  Recent Lipid Panel No results found for: CHOL, TRIG, HDL, CHOLHDL, VLDL, LDLCALC,  LDLDIRECT  Physical Exam:    VS:  BP (!) 148/92   Pulse (!) 57   Ht 5\' 2"  (1.575 m)   Wt 176 lb 11.2 oz (80.2 kg)   SpO2 90%   BMI 32.32 kg/m     Wt Readings from Last 3 Encounters:  04/28/19 176 lb 11.2 oz (80.2 kg)  04/26/19 117 lb (53.1 kg)  04/14/19 174 lb (78.9 kg)     GEN:  Overweight female, well developed in no acute distress HEENT: Normal NECK: No JVD; No carotid bruits CARDIAC: RRR, no murmurs, rubs, gallops RESPIRATORY:  Clear to auscultation without rales, wheezing or rhonchi  ABDOMEN: Soft, non-tender, non-distended MUSCULOSKELETAL:  Trace edema; ulnar deviation hands SKIN: Warm and dry NEUROLOGIC:  Alert and oriented x 3 PSYCHIATRIC:  Normal affect   ASSESSMENT:     Acute on chronic combined systolic and diastolic CHF (congestive heart failure) (HCC) Mild volume overload- improved with lasix  Lung cancer, left upper lobe S/P wedge resection-followed by Dr Lamonte Sakai  Non-ischemic cardiomyopathy [facility]) EF 30-35% March 2019- pt declined ICD and Entresto Rx  CAD (coronary artery disease) Mild, non obstructive CAD at cath Oct 2018  PLAN:    Same Rx -keep f/u with dr Stanford Breed as scheduled.  I suggested she speak with her PCP about her urinary incontinence as there are non surgical options.    Medication Adjustments/Labs and Tests Ordered: Current medicines are reviewed at length with the patient today.  Concerns regarding medicines are outlined above.  No orders of the defined types were placed in this encounter.  No orders of the defined types were placed in this encounter.   Patient Instructions  Medication Instructions:  NONE *If you need a refill on your cardiac medications before your next appointment, please call your pharmacy*   Lab Work: NONE If you have labs (blood work) drawn today and your tests are completely normal, you will receive your results only by: Marland Kitchen MyChart Message (if you have MyChart) OR . A paper copy  in the mail If you have  any lab test that is abnormal or we need to change your treatment, we will call you to review the results.   Testing/Procedures: NONE   Follow-Up: At [facility], you and your health needs are our priority.  As part of our continuing mission to provide you with exceptional heart care, we have created designated Provider Care Teams.  These Care Teams include your primary Cardiologist (physician) and Advanced Practice Providers (APPs -  Physician Assistants and Nurse Practitioners) who all work together to provide you with the care you need, when you need it.  We recommend signing up for the patient portal called "MyChart".  Sign up information is provided on this After Visit Summary.  MyChart is used to connect with patients for Virtual Visits (Telemedicine).  Patients are able to view lab/test results, encounter notes, upcoming appointments, etc.  Non-urgent messages can be sent to your provider as well.   To learn more about what you can do with MyChart, go to [email or web address].    kEEP SCHEDULED APPOINTMENT WITH DR. CRENSHAW IN MAY. Provider:   You may see Kirk Ruths, MD or one of the following Advanced Practice Providers on your designated Care Team:    Kerin Ransom, PA-C  [place], [place]  Coletta Memos, FNP       Signed, Kerin Ransom, [place]  04/28/2019 3:38 PM    [place]

## 2019-04-29 ENCOUNTER — Other Ambulatory Visit: Payer: Self-pay | Admitting: Allergy and Immunology

## 2019-05-03 ENCOUNTER — Other Ambulatory Visit: Payer: Self-pay

## 2019-05-03 ENCOUNTER — Ambulatory Visit: Payer: HMO | Admitting: Sports Medicine

## 2019-05-03 ENCOUNTER — Encounter: Payer: Self-pay | Admitting: Sports Medicine

## 2019-05-03 DIAGNOSIS — M79675 Pain in left toe(s): Secondary | ICD-10-CM | POA: Diagnosis not present

## 2019-05-03 DIAGNOSIS — M79674 Pain in right toe(s): Secondary | ICD-10-CM | POA: Diagnosis not present

## 2019-05-03 DIAGNOSIS — B351 Tinea unguium: Secondary | ICD-10-CM

## 2019-05-03 NOTE — Progress Notes (Signed)
Subjective: Taylor Berry is a 79 y.o. female patient seen today in office with complaint of mildly painful thickened and elongated toenails; unable to trim. Patient reports that she wants the left 1st toe nail smoothed down and wants to try nail polish again.   Patient Active Problem List   Diagnosis Date Noted  . Acute on chronic combined systolic and diastolic CHF (congestive heart failure) ([place]) 04/28/2019  . CAD (coronary artery disease) 04/28/2019  . Edema 04/14/2019  . Abnormal CT of the chest 03/22/2018  . Acid reflux 07/13/2017  . Affective psychosis ([place]) 07/13/2017  . Alcohol dependence in remission ([place]) 07/13/2017  . BD (Bowen's disease) 07/13/2017  . History of colon polyps 07/13/2017  . Malignant neoplasm of respiratory tract ([place]) 07/13/2017  . Rheumatoid arthritis ([place]) 07/13/2017  . Chronic bronchitis ([place]) 07/13/2017  . Secondary pulmonary hypertension 07/13/2017  . Obstructive apnea 07/13/2017  . Age related osteoporosis 07/13/2017  . Non-ischemic cardiomyopathy ([place])   . Hyperhidrosis 06/18/2016  . Diaphoresis 06/05/2015  . Pneumothorax, left 08/31/2013  . Acute bronchitis 05/10/2013  . Anemia in neoplastic disease 05/02/2013  . Left Chest Wound infection 04/29/2013  . Acute blood loss anemia 04/29/2013  . Allergic rhinitis 04/29/2013  . Depression 04/29/2013  . Acute respiratory failure ([place]) 04/22/2013  . Lung cancer, left upper lobe 02/03/2013  . Pulmonary infiltrates 01/05/2013  . Mild pulmonary hypertension ([place]) 02/17/2011  . OSA (obstructive sleep apnea) 02/17/2011  . Dyspnea on exertion 11/22/2010  . COPD (chronic obstructive pulmonary disease) ([name]) 07/20/2009  . Carcinoma in situ of cervix uteri 06/26/2009  . Pure hypercholesterolemia 06/26/2009  . OBESITY 06/26/2009  . UNSPECIFIED ANEMIA 06/26/2009  . Essential (primary) hypertension 06/26/2009  . ARTHRITIS, RHEUMATOID 06/26/2009  . OSTEOPOROSIS 06/26/2009  . Cough 06/26/2009     Current Outpatient Medications on File Prior to Visit  Medication Sig Dispense Refill  . albuterol (VENTOLIN HFA) 108 (90 Base) MCG/ACT inhaler INHALE 2 PUFFS BY MOUTH IN EACH NOSTRIL EVERY 4 TO 6 HOURS AS NEEDED 36 g 1  . aspirin (ASPIRIN 81) 81 MG EC tablet Take 81 mg by mouth daily.    Marland Kitchen atorvastatin (LIPITOR) 20 MG tablet TAKE 1 TABLET(20 MG) BY MOUTH DAILY 90 tablet 2  . candesartan (ATACAND) 8 MG tablet Take 1 tablet (8 mg total) by mouth daily. 90 tablet 3  . colestipol (COLESTID) 1 g tablet TAKE 1 TABLET BY MOUTH 1 TO 2 TIMES DAILY AS NEEDED FOR DIARRHEA 60 tablet 5  . denosumab (PROLIA) 60 MG/ML SOSY injection Inject 60 mg into the skin every 6 (six) months.    . famotidine (PEPCID) 40 MG tablet Take 1 tablet (40 mg total) by mouth every evening. 30 tablet 5  . fluticasone (FLONASE) 50 MCG/ACT nasal spray Use 1-2 sprays per nostril daily 16 g 5  . Fluticasone-Umeclidin-Vilant (TRELEGY ELLIPTA) 100-62.5-25 MCG/INH AEPB Inhale 1 puff into the lungs daily. 60 each 6  . furosemide (LASIX) 20 MG tablet Take 2 tablets by mouth once a day for 2 days, then take 1 tablet by mouth once a day 34 tablet 0  . lansoprazole (PREVACID) 30 MG capsule TAKE 1 CAPSULE BY MOUTH DAILY IN THE MORNING 30 capsule 5  . leflunomide (ARAVA) 20 MG tablet Take 20 mg by mouth daily.    . metoprolol succinate (TOPROL-XL) 50 MG 24 hr tablet TAKE 1 TABLET(50 MG) BY MOUTH DAILY 90 tablet 1  . venlafaxine XR (EFFEXOR-XR) 75 MG 24 hr capsule TK 1 C PO  QD WF     No current facility-administered medications on file prior to visit.    Allergies  Allergen Reactions  . Latex     REACTION: itching  . Ace Inhibitors     REACTION: cough  . Fentanyl     Hallucination "I go crazy"   . Hydrochlorothiazide     REACTION: decreased sodium  . Penicillins Other (See Comments)    REACTION: intolerant due to yeast infection Has patient had a PCN reaction causing immediate rash, facial/tongue/throat swelling, SOB or  lightheadedness with hypotension:No Has patient had a PCN reaction causing severe rash involving mucus membranes or skin necrosis: No Has patient had a PCN reaction that required hospitalization No Has patient had a PCN reaction occurring within the last 10 years: Yes If all of the above answers are "NO", then may proceed with Cephalosporin use.     Objective: Physical Exam  General: Well developed, nourished, no acute distress, awake, alert and oriented x 3  Vascular: Dorsalis pedis artery 1/4 bilateral, Posterior tibial artery 1/4 bilateral, skin temperature warm to warm proximal to distal bilateral lower extremities, mild varicosities, scant pedal hair present bilateral.  Neurological: Gross sensation present via light touch bilateral.   Dermatological: Skin is warm, dry, and supple bilateral, Nails 1-10 are tender, long, thick, and discolored with mild subungal debris with distal splitting bilateral hallux, no webspace macerations present bilateral, no open lesions present bilateral, no callus/corns/hyperkeratotic tissue present bilateral. No signs of infection bilateral.  Musculoskeletal: Asymptomatic hammertoe boney deformities noted bilateral. Muscular strength within normal limits without painon range of motion. No pain with calf compression bilateral.  Assessment and Plan:  Problem List Items Addressed This Visit    None    Visit Diagnoses    Pain due to onychomycosis of toenails of both feet    -  Primary     -Examined patient.  -Re-discussed treatment options for painful mycotic nails. -Mechanically debrided and reduced mycotic nails with sterile nail nipper and dremel nail file without incident. -Advised patient to try nail remedy polish  -Recommend biotin or hair skin and nail supplement like before -Advised patient to use tea tree oil to nails daily like before -Patient to return as needed for follow up evaluation or sooner if symptoms worsen.  Landis Martins,  DPM

## 2019-05-11 ENCOUNTER — Other Ambulatory Visit: Payer: Self-pay | Admitting: Cardiology

## 2019-05-11 ENCOUNTER — Ambulatory Visit: Payer: Self-pay | Admitting: Pharmacist

## 2019-05-17 ENCOUNTER — Ambulatory Visit: Payer: HMO | Admitting: Allergy and Immunology

## 2019-05-23 NOTE — Progress Notes (Signed)
HPI: FU CM. Pt withH/O dyspnea; also with h/o lung ca, COPD and OSA. Echocardiogram in December of 2012 showed an ejection fraction of 25-42%, grade 1 diastolic dysfunction and mild left atrial enlargement. Cardiac catheterization October 2018 showed mild nonobstructive coronary disease and normal filling pressures. Echocardiogram repeated [phone] and showed ejection fraction 30-35% and mild left atrial enlargement. Declined ICD previously.Since last seenme on exertion unchanged.  No orthopnea or PND.  Minimal pedal edema.  No chest pain or syncope.  Current Outpatient Medications  Medication Sig Dispense Refill  . albuterol (VENTOLIN HFA) 108 (90 Base) MCG/ACT inhaler INHALE 2 PUFFS BY MOUTH IN EACH NOSTRIL EVERY 4 TO 6 HOURS AS NEEDED 36 g 1  . aspirin (ASPIRIN 81) 81 MG EC tablet Take 81 mg by mouth daily.    Marland Kitchen atorvastatin (LIPITOR) 20 MG tablet TAKE 1 TABLET(20 MG) BY MOUTH DAILY 90 tablet 2  . candesartan (ATACAND) 8 MG tablet Take 1 tablet (8 mg total) by mouth daily. 90 tablet 3  . colestipol (COLESTID) 1 g tablet TAKE 1 TABLET BY MOUTH 1 TO 2 TIMES DAILY AS NEEDED FOR DIARRHEA 60 tablet 5  . denosumab (PROLIA) 60 MG/ML SOSY injection Inject 60 mg into the skin every 6 (six) months.    . famotidine (PEPCID) 40 MG tablet Take 1 tablet (40 mg total) by mouth every evening. 30 tablet 5  . fluticasone (FLONASE) 50 MCG/ACT nasal spray Use 1-2 sprays per nostril daily 16 g 5  . Fluticasone-Umeclidin-Vilant (TRELEGY ELLIPTA) 100-62.5-25 MCG/INH AEPB Inhale 1 puff into the lungs daily. 60 each 6  . furosemide (LASIX) 20 MG tablet TAKE 2 TABLETS BY MOUTH EVERY DAY FOR 2 DAYS THEN TAKE 1 TABLET BY MOUTH EVERY DAY 34 tablet 0  . lansoprazole (PREVACID) 30 MG capsule TAKE 1 CAPSULE BY MOUTH DAILY IN THE MORNING 30 capsule 5  . leflunomide (ARAVA) 20 MG tablet Take 20 mg by mouth daily.    . metoprolol succinate (TOPROL-XL) 50 MG 24 hr tablet TAKE 1 TABLET(50 MG) BY MOUTH DAILY 90 tablet 1   . venlafaxine XR (EFFEXOR-XR) 75 MG 24 hr capsule TK 1 C PO QD WF     No current facility-administered medications for this visit.     Past Medical History:  Diagnosis Date  . Abnormal CT scan   . Allergic rhinitis   . Anemia 2015   after lung surgery  . Angio-edema   . Complication of anesthesia    Fentanyl allergy, "claustrophobia"  . COPD (chronic obstructive pulmonary disease) ([place])   . Depression   . Diaphoresis 07/30/2015   Excessive sweating with little exertion  . Difficult intravenous access   . GERD (gastroesophageal reflux disease)    In the past  . Hyperlipidemia   . Hypertension   . Left bundle branch block   . Lung cancer ([place]) 2015   left upper lobe wedge removed-no further tx.  . Neuropathy    Tingling in arms, fingers, and feet (Since 06/15/16)  . Obesity   . OSA (obstructive sleep apnea)    denies.  . Osteoarthritis    ra also  . Osteoporosis   . Recurrent upper respiratory infection (URI)   . Rheumatoid arthritis(714.0)    Dr. Stark Jock follows  . SOB (shortness of breath) on exertion   . Uterine cancer ([place]) 1971   tx surgical    Past Surgical History:  Procedure Laterality Date  . ABDOMINAL HYSTERECTOMY     in situ  carcinoma partial  . CARDIAC CATHETERIZATION  01/27/11   minor non-obs CAD, NL EF, mild pulm HTN  . CATARACT EXTRACTION, BILATERAL     last done 12'16-  . COLONOSCOPY W/ BIOPSIES AND POLYPECTOMY    . COLONOSCOPY WITH PROPOFOL N/A 02/13/2015   Procedure: COLONOSCOPY WITH PROPOFOL;  Surgeon: Laurence Spates, MD;  [facility]: [facility];  Service: Endoscopy;  Laterality: N/A;  . ESOPHAGOGASTRODUODENOSCOPY (EGD) WITH PROPOFOL N/A 09/30/2016   Procedure: ESOPHAGOGASTRODUODENOSCOPY (EGD) WITH PROPOFOL;  Surgeon: Laurence Spates, MD;  [facility];  Service: Endoscopy;  Laterality: N/A;  . LYMPH NODE DISSECTION Left 04/12/2013   Procedure: LYMPH NODE DISSECTION;  Surgeon: [name], MD;  Location: [place];  Service: Thoracic;   Laterality: Left;  . RIGHT/LEFT HEART CATH AND CORONARY ANGIOGRAPHY N/A 10/08/2016   Procedure: RIGHT/LEFT HEART CATH AND CORONARY ANGIOGRAPHY;  Surgeon: Burnell Blanks, MD;  Location: [place] [facility];  Service: Cardiovascular;  Laterality: N/A;  . TONSILLECTOMY    . VESICOVAGINAL FISTULA CLOSURE W/ TAH  1971  . VIDEO ASSISTED THORACOSCOPY (VATS)/THOROCOTOMY Left 04/12/2013   Procedure: VIDEO ASSISTED THORACOSCOPY (VATS)/THOROCOTOMY, WITH LEFT UPPER LOBE WEDGE RESECTION, CHEST WALL BIOPSY ;  Surgeon: [name], MD;  Location: [place];  Service: Thoracic;  Laterality: Left;  Marland Kitchen VIDEO BRONCHOSCOPY N/A 04/12/2013   Procedure: VIDEO BRONCHOSCOPY;  Surgeon: [name], MD;  Location: [facility];  Service: Thoracic;  Laterality: N/A;    Social History   Socioeconomic History  . Marital status: Widowed    Spouse name: Not on file  . Number of children: 2  . Years of education: Not on file  . Highest education level: Not on file  Occupational History  . Occupation: Nurse, adult: [employer]  Tobacco Use  . Smoking status: Former Smoker    Packs/day: 1.00    Years: 35.00    Pack years: 35.00    Types: Cigarettes    Quit date: 01/07/1988    Years since quitting: 31.4  . Smokeless tobacco: Never Used  Substance and Sexual Activity  . Alcohol use: No    Comment: not since 1988  . Drug use: No  . Sexual activity: Never  Other Topics Concern  . Not on file  Social History Narrative  . Not on file   Social Determinants of Health   Financial Resource Strain:   . Difficulty of Paying Living Expenses:   Food Insecurity:   . Worried About Charity fundraiser in the Last Year:   . Arboriculturist in the Last Year:   Transportation Needs:   . Film/video editor (Medical):   Marland Kitchen Lack of Transportation (Non-Medical):   Physical Activity:   . Days of Exercise per Week:   . Minutes of Exercise per Session:   Stress:   . Feeling of Stress :   Social Connections:   .  Frequency of Communication with Friends and Family:   . Frequency of Social Gatherings with Friends and Family:   . Attends Religious Services:   . Active Member of Clubs or Organizations:   . Attends Archivist Meetings:   Marland Kitchen Marital Status:   Intimate Partner Violence:   . Fear of Current or Ex-Partner:   . Emotionally Abused:   Marland Kitchen Physically Abused:   . Sexually Abused:     Family History  Problem Relation Age of Onset  . Lung cancer Mother   . Coronary artery disease Son   .  Heart attack Son 29  . Arthritis/Rheumatoid Maternal Grandmother   . Coronary artery disease Son   . Arthritis/Rheumatoid Son     ROS: no fevers or chills, productive cough, hemoptysis, dysphasia, odynophagia, melena, hematochezia, dysuria, hematuria, rash, seizure activity, orthopnea, PND, pedal edema, claudication. Remaining systems are negative.  Physical Exam: Well-developed well-nourished in no acute distress.  Skin is warm and dry.  HEENT is normal.  Neck is supple.  Chest is clear to auscultation with normal expansion.  Cardiovascular exam is regular rate and rhythm.  Abdominal exam nontender or distended. No masses palpated. Extremities show trace edema. neuro grossly intact  A/P  1 nonischemic cardiomyopathy-plan to continue ARB and beta-blocker at present dose.  She has declined Entresto and ICD previously.  Repeat echocardiogram.  2 hypertension-blood pressure controlled.  Continue present medication.  3 hyperlipidemia-continue statin.  4 history of nonobstructive coronary disease-continue medical therapy with aspirin and statin.  5 chronic systolic congestive heart failure-recently initiated on Lasix for volume excess.  Her symptoms have improved but she still has some dyspnea which is chronic.  We will schedule a BNP.  Continue Lasix at present dose.  Check potassium and renal function.  We discussed fluid restriction and low-sodium diet.  6 history of dyspnea-this is felt  to be multifactorial including prior lung cancer with lung resection, COPD, obstructive sleep apnea and obesity hypoventilation syndrome.  As outlined in previous notes filling pressures were normal at time of catheterization.  Kirk Ruths, MD

## 2019-05-24 ENCOUNTER — Encounter: Payer: Self-pay | Admitting: Allergy and Immunology

## 2019-05-24 ENCOUNTER — Other Ambulatory Visit: Payer: Self-pay

## 2019-05-24 ENCOUNTER — Ambulatory Visit (INDEPENDENT_AMBULATORY_CARE_PROVIDER_SITE_OTHER): Payer: HMO | Admitting: Allergy and Immunology

## 2019-05-24 VITALS — BP 130/82 | HR 68 | Temp 97.8°F | Resp 16

## 2019-05-24 DIAGNOSIS — R197 Diarrhea, unspecified: Secondary | ICD-10-CM | POA: Diagnosis not present

## 2019-05-24 DIAGNOSIS — J3089 Other allergic rhinitis: Secondary | ICD-10-CM | POA: Diagnosis not present

## 2019-05-24 DIAGNOSIS — J449 Chronic obstructive pulmonary disease, unspecified: Secondary | ICD-10-CM | POA: Diagnosis not present

## 2019-05-24 DIAGNOSIS — J4489 Other specified chronic obstructive pulmonary disease: Secondary | ICD-10-CM

## 2019-05-24 DIAGNOSIS — K219 Gastro-esophageal reflux disease without esophagitis: Secondary | ICD-10-CM

## 2019-05-24 NOTE — Progress Notes (Signed)
[place]   Follow-up Note  Referring Provider: Kelton Pillar, MD Primary Provider: Kelton Pillar, MD Date of Office Visit: 05/24/2019  Subjective:   Taylor Berry (DOB: 1940/09/11) is a 79 y.o. female who returns to the [place] on 05/24/2019 in re-evaluation of the following:  HPI: Zalma returns to this clinic in evaluation of COPD with asthma, allergic rhinitis, LPR, cholesterol responsive diarrhea.  Her last visit to this clinic was 14 December 2018.  She has really done well with her lower respiratory tract issue without the need for systemic steroid or an antibiotic and rare use of a short acting bronchodilator while she continues on Trelegy.  She has had very little issues with her nose while using Flonase.  She has not been having any pain or problems with reflux or her throat while using PPI and famotidine.  She has not been having any diarrhea while using colestipol.  She has had the sensation that there is something different about her left ear.  She feels as though "there is something in it".  She does not have any associated hearing loss above and beyond her chronic hearing loss and she does not feel as though she has had any tinnitus or vertigo but she might be a little bit unsteady.  Interestingly, this appears to correlate with starting her diuretics about 2 months ago.  She has seen Dr. Janace Hoard, ENT, in the past.  She has had some problems with hands and feet swelling addressed by her rheumatologist and her cardiologist.  She was placed on Lasix for the swelling recently, as noted above, and both her hand and feet swelling is actually much better.  She has had a repeat scan of her pulmonary nodule followed by Dr. Lamonte Sakai which appears to be stable.  She has received two Covid vaccinations.  Allergies as of 05/24/2019      Reactions   Latex    REACTION: itching   Ace Inhibitors    REACTION: cough    Fentanyl    Hallucination "I go crazy"    Hydrochlorothiazide    REACTION: decreased sodium   Penicillins Other (See Comments)   REACTION: intolerant due to yeast infection Has patient had a PCN reaction causing immediate rash, facial/tongue/throat swelling, SOB or lightheadedness with hypotension:No Has patient had a PCN reaction causing severe rash involving mucus membranes or skin necrosis: No Has patient had a PCN reaction that required hospitalization No Has patient had a PCN reaction occurring within the last 10 years: Yes If all of the above answers are "NO", then may proceed with Cephalosporin use.      Medication List      albuterol 108 (90 Base) MCG/ACT inhaler Commonly known as: VENTOLIN HFA INHALE 2 PUFFS BY MOUTH IN EACH NOSTRIL EVERY 4 TO 6 HOURS AS NEEDED   Aspirin 81 81 MG EC tablet Generic drug: aspirin Take 81 mg by mouth daily.   atorvastatin 20 MG tablet Commonly known as: LIPITOR TAKE 1 TABLET(20 MG) BY MOUTH DAILY   candesartan 8 MG tablet Commonly known as: ATACAND Take 1 tablet (8 mg total) by mouth daily.   colestipol 1 g tablet Commonly known as: COLESTID TAKE 1 TABLET BY MOUTH 1 TO 2 TIMES DAILY AS NEEDED FOR DIARRHEA   denosumab 60 MG/ML Sosy injection Commonly known as: PROLIA Inject 60 mg into the skin every 6 (six) months.   famotidine 40 MG tablet Commonly known  as: PEPCID Take 1 tablet (40 mg total) by mouth every evening.   fluticasone 50 MCG/ACT nasal spray Commonly known as: FLONASE Use 1-2 sprays per nostril daily   furosemide 20 MG tablet Commonly known as: LASIX TAKE 2 TABLETS BY MOUTH EVERY DAY FOR 2 DAYS THEN TAKE 1 TABLET BY MOUTH EVERY DAY   lansoprazole 30 MG capsule Commonly known as: PREVACID TAKE 1 CAPSULE BY MOUTH DAILY IN THE MORNING   leflunomide 20 MG tablet Commonly known as: ARAVA Take 20 mg by mouth daily.   metoprolol succinate 50 MG 24 hr tablet Commonly known as: TOPROL-XL TAKE 1 TABLET(50 MG) BY  MOUTH DAILY   Trelegy Ellipta 100-62.5-25 MCG/INH Aepb Generic drug: Fluticasone-Umeclidin-Vilant Inhale 1 puff into the lungs daily.   venlafaxine XR 75 MG 24 hr capsule Commonly known as: EFFEXOR-XR TK 1 C PO QD WF       Past Medical History:  Diagnosis Date  . Abnormal CT scan   . Allergic rhinitis   . Anemia 2015   after lung surgery  . Angio-edema   . Complication of anesthesia    Fentanyl allergy, "claustrophobia"  . COPD (chronic obstructive pulmonary disease) ([place])   . Depression   . Diaphoresis 07/30/2015   Excessive sweating with little exertion  . Difficult intravenous access   . GERD (gastroesophageal reflux disease)    In the past  . Hyperlipidemia   . Hypertension   . Left bundle branch block   . Lung cancer ([place]) 2015   left upper lobe wedge removed-no further tx.  . Neuropathy    Tingling in arms, fingers, and feet (Since 06/15/16)  . Obesity   . OSA (obstructive sleep apnea)    denies.  . Osteoarthritis    ra also  . Osteoporosis   . Recurrent upper respiratory infection (URI)   . Rheumatoid arthritis(714.0)    Dr. Stark Jock follows  . SOB (shortness of breath) on exertion   . Uterine cancer ([place]) 1971   tx surgical    Past Surgical History:  Procedure Laterality Date  . ABDOMINAL HYSTERECTOMY     in situ carcinoma partial  . CARDIAC CATHETERIZATION  01/27/11   minor non-obs CAD, NL EF, mild pulm HTN  . CATARACT EXTRACTION, BILATERAL     last done 12'16-  . COLONOSCOPY W/ BIOPSIES AND POLYPECTOMY    . COLONOSCOPY WITH PROPOFOL N/A 02/13/2015   Procedure: COLONOSCOPY WITH PROPOFOL;  Surgeon: Laurence Spates, MD;  [facility];  Service: Endoscopy;  Laterality: N/A;  . ESOPHAGOGASTRODUODENOSCOPY (EGD) WITH PROPOFOL N/A 09/30/2016   Procedure: ESOPHAGOGASTRODUODENOSCOPY (EGD) WITH PROPOFOL;  Surgeon: Laurence Spates, MD;  [facility];  Service: Endoscopy;  Laterality: N/A;  . LYMPH NODE DISSECTION Left 04/12/2013   Procedure:  LYMPH NODE DISSECTION;  Surgeon: [name], MD;  Location: [place];  Service: Thoracic;  Laterality: Left;  . RIGHT/LEFT HEART CATH AND CORONARY ANGIOGRAPHY N/A 10/08/2016   Procedure: RIGHT/LEFT HEART CATH AND CORONARY ANGIOGRAPHY;  Surgeon: Burnell Blanks, MD;  Location: [place] [facility];  Service: Cardiovascular;  Laterality: N/A;  . TONSILLECTOMY    . VESICOVAGINAL FISTULA CLOSURE W/ TAH  1971  . VIDEO ASSISTED THORACOSCOPY (VATS)/THOROCOTOMY Left 04/12/2013   Procedure: VIDEO ASSISTED THORACOSCOPY (VATS)/THOROCOTOMY, WITH LEFT UPPER LOBE WEDGE RESECTION, CHEST WALL BIOPSY ;  Surgeon: [name], MD;  Location: [place];  Service: Thoracic;  Laterality: Left;  Marland Kitchen VIDEO BRONCHOSCOPY N/A 04/12/2013   Procedure: VIDEO BRONCHOSCOPY;  Surgeon: [name],  MD;  [facility];  Service: Thoracic;  Laterality: N/A;    Review of systems negative except as noted in HPI / PMHx or noted below:  Review of Systems  Constitutional: Negative.   HENT: Negative.   Eyes: Negative.   Respiratory: Negative.   Cardiovascular: Negative.   Gastrointestinal: Negative.   Genitourinary: Negative.   Musculoskeletal: Negative.   Skin: Negative.   Neurological: Negative.   Endo/Heme/Allergies: Negative.   Psychiatric/Behavioral: Negative.      Objective:   Vitals:   05/24/19 1148  BP: 130/82  Pulse: 68  Resp: 16  Temp: 97.8 F (36.6 C)  SpO2: 94%          Physical Exam Constitutional:      Appearance: She is not diaphoretic.  HENT:     Head: Normocephalic.     Right Ear: Tympanic membrane, ear canal and external ear normal.     Left Ear: Tympanic membrane, ear canal and external ear normal.     Nose: Nose normal. No mucosal edema or rhinorrhea.     Mouth/Throat:     Pharynx: Uvula midline. No oropharyngeal exudate.  Eyes:     Conjunctiva/sclera: Conjunctivae normal.  Neck:     Thyroid: No thyromegaly.     Trachea: Trachea normal. No tracheal tenderness or  tracheal deviation.  Cardiovascular:     Rate and Rhythm: Normal rate and regular rhythm.     Heart sounds: Normal heart sounds, S1 normal and S2 normal. No murmur.  Pulmonary:     Effort: No respiratory distress.     Breath sounds: Normal breath sounds. No stridor. No wheezing or rales.  Lymphadenopathy:     Head:     Right side of head: No tonsillar adenopathy.     Left side of head: No tonsillar adenopathy.     Cervical: No cervical adenopathy.  Skin:    Findings: No erythema or rash.     Nails: There is no clubbing.  Neurological:     Mental Status: She is alert.     Diagnostics:    Spirometry was performed and demonstrated an FEV1 of 1.0 at 55 % of predicted.  The patient had an Asthma Control Test with the following results: ACT Total Score: 24.    Assessment and Plan:   1. COPD with asthma ([place])   2. Perennial allergic rhinitis   3. LPRD (laryngopharyngeal reflux disease)   4. Diarrhea, unspecified type     1.  Continue to treat reflux and LPR with the following combination:   A. Prevacid 30mg  1 time per day in AM  B. Famotidine 40mg  1 time per day in PM  2.  Continue Colestipol 1 g tablet 1-2 times a day for diarrhea  3.  Continue flonase -1-2 sprays each nostril 1 time per day  4.  Continue Trelegy 100 - 1 inhalation 1 time per day  5.  Can use pro-air HFA 2 inhalations every 4-6 hours.  6.  Return to clinic in 6 months or earlier if problem  7. Can visit with [facility] ENT if ear issue continues   Nikiesha appears to have very good control of all of her respiratory tract issues including her COPD with asthma and her allergic rhinitis while using a collection of anti-inflammatory agents directed against her respiratory tract.  And her reflux and LPR appears to be under very good control on her proton pump inhibitor and H2 receptor blocker.  Her colestipol responsive diarrhea appears to be very  stable.  Her ear issue is not defined.  There did not appear to be  any identifiable abnormality on exam today.  I informed her that she could visit with [facility] ENT if this ear issue continues.  However, it should be noted that this ear issue appeared to correlate with the use of Lasix about 2 months ago and she may be developing some very subtle side effects from the use of this medication.  Allena Katz, MD Allergy / Immunology [place]

## 2019-05-24 NOTE — Patient Instructions (Signed)
  1.  Continue to treat reflux and LPR with the following combination:   A. Prevacid 30mg  1 time per day in AM  B. Famotidine 40mg  1 time per day in PM  2.  Continue Colestipol 1 g tablet 1-2 times a day for diarrhea  3.  Continue flonase -1-2 sprays each nostril 1 time per day  4.  Continue Trelegy 100 - 1 inhalation 1 time per day  5.  Can use pro-air HFA 2 inhalations every 4-6 hours.  6.  Return to clinic in 6 months or earlier if problem  7. Can visit with [facility] ENT if ear issue continues

## 2019-05-25 ENCOUNTER — Encounter: Payer: Self-pay | Admitting: Allergy and Immunology

## 2019-05-25 ENCOUNTER — Other Ambulatory Visit: Payer: Self-pay

## 2019-05-25 MED ORDER — METOPROLOL SUCCINATE ER 50 MG PO TB24: ORAL_TABLET | ORAL | 1 refills | Status: DC

## 2019-05-30 ENCOUNTER — Ambulatory Visit (INDEPENDENT_AMBULATORY_CARE_PROVIDER_SITE_OTHER): Payer: HMO | Admitting: Cardiology

## 2019-05-30 ENCOUNTER — Other Ambulatory Visit: Payer: Self-pay

## 2019-05-30 ENCOUNTER — Encounter: Payer: Self-pay | Admitting: Cardiology

## 2019-05-30 VITALS — BP 136/80 | HR 72 | Ht 62.0 in | Wt 175.8 lb

## 2019-05-30 DIAGNOSIS — I428 Other cardiomyopathies: Secondary | ICD-10-CM | POA: Diagnosis not present

## 2019-05-30 DIAGNOSIS — I10 Essential (primary) hypertension: Secondary | ICD-10-CM

## 2019-05-30 DIAGNOSIS — I2510 Atherosclerotic heart disease of native coronary artery without angina pectoris: Secondary | ICD-10-CM | POA: Diagnosis not present

## 2019-05-30 DIAGNOSIS — E7800 Pure hypercholesterolemia, unspecified: Secondary | ICD-10-CM

## 2019-05-30 NOTE — Patient Instructions (Signed)
Medication Instructions:  NO CHANGE *If you need a refill on your cardiac medications before your next appointment, please call your pharmacy*   Lab Work: Your physician recommends that you HAVE LAB WORK TODAY If you have labs (blood work) drawn today and your tests are completely normal, you will receive your results only by: Marland Kitchen MyChart Message (if you have MyChart) OR . A paper copy in the mail If you have any lab test that is abnormal or we need to change your treatment, we will call you to review the results.   Testing/Procedures: Your physician has requested that you have an echocardiogram. Echocardiography is a painless test that uses sound waves to create images of your heart. It provides your doctor with information about the size and shape of your heart and how well your heart's chambers and valves are working. This procedure takes approximately one hour. There are no restrictions for this procedure.[place]     Follow-Up: At [facility], you and your health needs are our priority.  As part of our continuing mission to provide you with exceptional heart care, we have created designated Provider Care Teams.  These Care Teams include your primary Cardiologist (physician) and Advanced Practice Providers (APPs -  Physician Assistants and Nurse Practitioners) who all work together to provide you with the care you need, when you need it.  We recommend signing up for the patient portal called "MyChart".  Sign up information is provided on this After Visit Summary.  MyChart is used to connect with patients for Virtual Visits (Telemedicine).  Patients are able to view lab/test results, encounter notes, upcoming appointments, etc.  Non-urgent messages can be sent to your provider as well.   To learn more about what you can do with MyChart, go to [email or web address].    Your next appointment:   3 month(s)  The format for your next appointment:   In Person  Provider:    Kirk Ruths, MD

## 2019-05-30 NOTE — Addendum Note (Signed)
Addended by: Cristopher Estimable on: 05/30/2019 12:05 PM   Modules accepted: Orders

## 2019-05-31 LAB — BASIC METABOLIC PANEL
BUN/Creatinine Ratio: 28 (ref 12–28)
BUN: 22 mg/dL (ref 8–27)
CO2: 24 mmol/L (ref 20–29)
Calcium: 8.7 mg/dL (ref 8.7–10.3)
Chloride: 103 mmol/L (ref 96–106)
Creatinine, Ser: 0.78 mg/dL (ref 0.57–1.00)
GFR calc Af Amer: 84 mL/min/{1.73_m2} (ref >59)
GFR calc non Af Amer: 73 mL/min/{1.73_m2} (ref >59)
Glucose: 103 mg/dL — ABNORMAL HIGH (ref 65–99)
Potassium: 4.8 mmol/L (ref 3.5–5.2)
Sodium: 139 mmol/L (ref 134–144)

## 2019-05-31 LAB — PRO B NATRIURETIC PEPTIDE: NT-Pro BNP: 275 pg/mL (ref 0–738)

## 2019-06-07 DIAGNOSIS — D225 Melanocytic nevi of trunk: Secondary | ICD-10-CM | POA: Diagnosis not present

## 2019-06-07 DIAGNOSIS — Z85828 Personal history of other malignant neoplasm of skin: Secondary | ICD-10-CM | POA: Diagnosis not present

## 2019-06-07 DIAGNOSIS — L821 Other seborrheic keratosis: Secondary | ICD-10-CM | POA: Diagnosis not present

## 2019-06-07 DIAGNOSIS — D1801 Hemangioma of skin and subcutaneous tissue: Secondary | ICD-10-CM | POA: Diagnosis not present

## 2019-06-13 ENCOUNTER — Other Ambulatory Visit: Payer: Self-pay

## 2019-06-13 NOTE — Patient Outreach (Signed)
  [facility]) Care Management Chronic Special Needs Program    06/13/2019  Name: Taylor Berry, DOB: Mar 19, 1940  MRN: [id number]   Taylor Berry is enrolled in a chronic special needs plan for Heart Failure. RNCM called to follow up, review health risk assessment and update care plan. Client reports this is not a good time to talk and request to reschedule call.  Plan: RNCM rescheduled call for tomorrow.  Thea Silversmith, RN, MSN, [place] [phone]

## 2019-06-14 ENCOUNTER — Other Ambulatory Visit: Payer: Self-pay

## 2019-06-14 NOTE — Patient Outreach (Signed)
  [facility]) Care Management Chronic Special Needs Program    06/14/2019  Name: Taylor Berry, DOB: 10-24-1940  MRN: [id number]   Ms. Taylor Berry is enrolled in a chronic special needs plan for Heart Failure. RNCM called to follow up, review health risk assessment and updated individualized care plan. No answer. HIPAA compliant message left. 2nd attempt.  Plan: Chronic care management coordinator will attempt outreach within 1-2 weeks.  Thea Silversmith, RN, MSN, [place] [phone]

## 2019-06-15 ENCOUNTER — Other Ambulatory Visit: Payer: Self-pay

## 2019-06-15 ENCOUNTER — Other Ambulatory Visit: Payer: Self-pay | Admitting: Cardiology

## 2019-06-15 NOTE — Patient Outreach (Signed)
[facility]) Care Management Chronic Special Needs Program  06/15/2019  Name: Taylor Berry DOB: October 06, 1940  MRN: [id number]  Ms. Taylor Berry is enrolled in a chronic special needs plan for Heart Failure. Chronic Care Management Coordinator telephoned client to follow up, review health risk assessment and to update individualized care plan.  RNCM reviewed the chronic care management program, importance of client participation, and taking their care plan to all provider appointments and inpatient facilities.   Subjective: client reports history of congestive heart failure, heart disease, hypertension, COPD. Client has attend follow up appointments with primary care and cardiologist as scheduled. She states she lives alone and manages medications without difficulty. 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  Client has hearing aids and reports she would like to see audiologist, but the audiologist she wants to see is not in network. She states she has checked in the past. Unclear if she has checked since 2021.  Goals Addressed            This Visit's Progress   . Client verbalize knowledge of Heart Failure disease self management skills within the next 6-9 months.   On track    Discussed signs/symptoms of heart failure exacerbation and when to call the doctor. Reviewed [employer] calendar sent in the mail for heart failure information. Discussed daily weights and it's importance. You are doing a great job in keeping up with your weights. Call your provider if you gain 3 pounds overnight or 5 pounds in a week. Continue to follow a low salt mail plan, read food labels for the salt content. Emmi education provided on "Heart failure exercise guide" diet. Review and plan to discuss with RN during next telephonic assessment.    . Client will report no worsening of symptoms related to heart disease within the next 6-9  months    On track    Notify your rovider for symptoms of chest pain, sweating, nausea/vomiting, irregular heartbeat, palpitations, rapid heart rate, shortness of breath, dizziness or fainting. Call 911 for severe symptoms of chest pain or shortness of breath. Take medications as prescribed. Follow up with your Cardiologist (heart doctor) for yearly visits. Emmi education, "coronary artery disease in women". Please review and plan to discuss with RN at next telephonic assessment.    . COMPLETED: Client will report weighing and recording weights daily within the next 3 months.       Discussed daily weights. Discussed [employer] Calendar. Client states, "I am weighing myself every day" she reports she has her own place where she likes to write down weights. Weight today 174 pounds and states ranging "1 pound here and there" difference each day.    . Client will verbalize knowledge of chronic lung disease as evidenced by no ED visits or Inpatient stays related to chronic lung disease    On track    Review [employer] calendar sent in the mail for COPD information and actions plan. Know when and how to use rescue inhaler and daily inhalers, along with other medication. Contact 911 if your are having severe shortness of breath, get to the hospital immediately. Discussed COVID-19 vaccination. Emmi education provided on "exacerbation of COPD". Review and plan to discuss with RN during next telephonic assessment.    . Client will verbalize knowledge of self management of Hypertension as evidences by BP reading of 140/90 or less; or as defined by provider   On track  Plan to check your blood pressure regularly. Record and take them with you to your provider appointments. Plan to eat low salt and heart heatlhy meals with fruits, vegetables, whole grains, lean protein and limit fat and sugars. Increase activity as tolerated. Reviewed lifestyle modifications such as weight control,  exercise, monitoring salt intake. Take medications as prescribed. Emmi education provided, "DASH diet". Review and plan to discuss with RN at next telephonic assessment.    . Obtain annual  Lipid Profile, LDL-C   On track    Try to avoid saturated fats, trans-fats and eat more fiber. Plan to take a statin (cholesterol) medicine as prescribed.    . Patient Stated:loose 15 pounds   On track    Discussed client's desire to loose weight. Discussed availability of Health Coach benefit from [employer] Referral made to Health coach Emmi education provided: "weight loss tips'. Please review and be prepared to discuss at next telephonic assessment.     . Visit Primary Care Provider or Cardiologist at least 2 times per year   On track    Complete your Annual Wellness visit every year. Follow up with your providers as scheduled. RN to follow up with audiologist in network from healthcare concierge.      Client reports she is fully vaccinated for Covid-19. RNCM encouraged client to continue Covid-19 precautions. Encouraged client to follow up with RNCM as needed and to contact the concierge for benefits questions. RNCM also reiterated the availability of the 24 hour nurse advice line.  Plan: send updated care plan to client; send updated care plan to primary care. Send Neurosurgeon.     Thea Silversmith, RN, MSN, [place] [phone]

## 2019-06-21 ENCOUNTER — Ambulatory Visit: Payer: Self-pay

## 2019-06-22 ENCOUNTER — Ambulatory Visit (HOSPITAL_COMMUNITY): Payer: HMO | Attending: Cardiovascular Disease

## 2019-06-22 ENCOUNTER — Other Ambulatory Visit: Payer: Self-pay

## 2019-06-22 DIAGNOSIS — I428 Other cardiomyopathies: Secondary | ICD-10-CM | POA: Diagnosis not present

## 2019-07-01 ENCOUNTER — Other Ambulatory Visit: Payer: Self-pay | Admitting: Allergy and Immunology

## 2019-07-27 ENCOUNTER — Telehealth: Payer: Self-pay | Admitting: Cardiology

## 2019-07-27 MED ORDER — FUROSEMIDE 20 MG PO TABS: ORAL_TABLET | ORAL | 3 refills | Status: DC

## 2019-07-27 NOTE — Telephone Encounter (Signed)
She can try taking on PRN basis. Watch weight daily, > 3 lb gain/day or 5 lb/wk will need to use, or if swelling resumes

## 2019-07-27 NOTE — Telephone Encounter (Signed)
Patient is calling to ask Dr. Stanford Breed if it is ok if she stops her lasix for a while. She states that for a couple weeks now she has been walking and has not had any swelling and states she feels good. Please advise.

## 2019-07-27 NOTE — Telephone Encounter (Signed)
Called and spoke with pt, notified of Hinton Rao. RPH's recommendations for taking her lasix on an as needed basis, watching her weight daily to see if she has a wt gain of >3lbs in a day or >5lbs in a week and watch for swelling. Notified if she does have a weight gain such as listed above to contact our office as well. Pt aware of what to monitor for at this time and will try the lasix on a PRN basis. No other questions at this time.  Will send a refill of her lasix to her preferred pharmacy.

## 2019-07-29 ENCOUNTER — Telehealth: Payer: Self-pay | Admitting: Emergency Medicine

## 2019-07-29 DIAGNOSIS — R911 Solitary pulmonary nodule: Secondary | ICD-10-CM

## 2019-07-29 NOTE — Telephone Encounter (Signed)
Patient contacted, last OV states she needs a CT in August 2021. Can we write for that? What type and the reason? See visit notes from 04/26/2019.

## 2019-07-29 NOTE — Telephone Encounter (Signed)
No order placed we will need one please

## 2019-08-01 NOTE — Telephone Encounter (Signed)
She needs a Ct chest without contrast in August to follow a RLL nodule.

## 2019-08-01 NOTE — Telephone Encounter (Signed)
Patient contacted, order for CT ordered as requested.

## 2019-08-09 ENCOUNTER — Other Ambulatory Visit: Payer: Self-pay

## 2019-08-09 ENCOUNTER — Ambulatory Visit
Admission: RE | Admit: 2019-08-09 | Discharge: 2019-08-09 | Disposition: A | Discharge status: Home / Self Care | Payer: HMO | Source: Ambulatory Visit | Attending: Emergency Medicine | Admitting: Emergency Medicine

## 2019-08-09 DIAGNOSIS — R911 Solitary pulmonary nodule: Secondary | ICD-10-CM

## 2019-08-09 DIAGNOSIS — I2510 Atherosclerotic heart disease of native coronary artery without angina pectoris: Secondary | ICD-10-CM | POA: Diagnosis not present

## 2019-08-09 DIAGNOSIS — J479 Bronchiectasis, uncomplicated: Secondary | ICD-10-CM | POA: Diagnosis not present

## 2019-08-09 DIAGNOSIS — J9811 Atelectasis: Secondary | ICD-10-CM | POA: Diagnosis not present

## 2019-08-09 DIAGNOSIS — I700 Atherosclerosis of aorta: Secondary | ICD-10-CM | POA: Diagnosis not present

## 2019-08-09 NOTE — Patient Outreach (Signed)
  [facility]) Care Management Chronic Special Needs Program    08/09/2019  Name: Taylor Berry, DOB: May 11, 1940  MRN: [id number]   Ms. Taylor Berry is enrolled in a chronic special needs plan for Heart Failure. RNCM covering for assigned RNCM Member called to follow up on message from [employer]) Health Coach concerning clients scale. Client states that her scale is not working.  States she called the manufacture and they are sending her a new scale but it will take 2 weeks for it to get to her.  States she is concerned about going that long without weighting. RNCM arranged to have [place] send client a scale that will be deliver tomorrow.  Client informed to be looking for scale to be delivered and instructed to call if she does get in 1-2 days. Clients RNCM will follow up with client as scheduled per tier level  [place], [facility], [place] Management [phone]

## 2019-08-11 ENCOUNTER — Telehealth: Payer: Self-pay | Admitting: Emergency Medicine

## 2019-08-11 NOTE — Telephone Encounter (Signed)
LMTCB x 1 for the pt

## 2019-08-11 NOTE — Telephone Encounter (Signed)
Called and spoke with patient letting her know that I would send a message to Dr. Lamonte Sakai for the results and we would call her back.  Dr. Lamonte Sakai please advise

## 2019-08-11 NOTE — Telephone Encounter (Signed)
Please let her know that her nodules are stable in size. This is good news

## 2019-08-12 ENCOUNTER — Other Ambulatory Visit: Payer: Self-pay | Admitting: Cardiology

## 2019-08-12 ENCOUNTER — Other Ambulatory Visit: Payer: Self-pay | Admitting: Allergy and Immunology

## 2019-08-12 DIAGNOSIS — E7800 Pure hypercholesterolemia, unspecified: Secondary | ICD-10-CM

## 2019-08-12 NOTE — Telephone Encounter (Signed)
Patient requesting we notify Dr. Janeece Fitting that her CT chest was obtained. See results in Epic.

## 2019-08-12 NOTE — Telephone Encounter (Signed)
Pt called back for this, please return call

## 2019-08-15 DIAGNOSIS — E669 Obesity, unspecified: Secondary | ICD-10-CM | POA: Diagnosis not present

## 2019-08-15 DIAGNOSIS — M06322 Rheumatoid nodule, left elbow: Secondary | ICD-10-CM | POA: Diagnosis not present

## 2019-08-15 DIAGNOSIS — R29898 Other symptoms and signs involving the musculoskeletal system: Secondary | ICD-10-CM | POA: Diagnosis not present

## 2019-08-15 DIAGNOSIS — R232 Flushing: Secondary | ICD-10-CM | POA: Diagnosis not present

## 2019-08-15 DIAGNOSIS — R682 Dry mouth, unspecified: Secondary | ICD-10-CM | POA: Diagnosis not present

## 2019-08-15 DIAGNOSIS — M0589 Other rheumatoid arthritis with rheumatoid factor of multiple sites: Secondary | ICD-10-CM | POA: Diagnosis not present

## 2019-08-15 DIAGNOSIS — M810 Age-related osteoporosis without current pathological fracture: Secondary | ICD-10-CM | POA: Diagnosis not present

## 2019-08-15 DIAGNOSIS — Z6831 Body mass index (BMI) 31.0-31.9, adult: Secondary | ICD-10-CM | POA: Diagnosis not present

## 2019-08-15 DIAGNOSIS — Z79899 Other long term (current) drug therapy: Secondary | ICD-10-CM | POA: Diagnosis not present

## 2019-08-15 DIAGNOSIS — E785 Hyperlipidemia, unspecified: Secondary | ICD-10-CM | POA: Diagnosis not present

## 2019-08-15 DIAGNOSIS — M150 Primary generalized (osteo)arthritis: Secondary | ICD-10-CM | POA: Diagnosis not present

## 2019-08-23 DIAGNOSIS — D649 Anemia, unspecified: Secondary | ICD-10-CM | POA: Diagnosis not present

## 2019-08-23 NOTE — Progress Notes (Signed)
HPI: FU CM. Pt withH/O dyspnea; also with h/o lung ca, COPD and OSA. Cardiac catheterization October 2018 showed mild nonobstructive coronary disease and normal filling pressures. Echocardiogram repeated [id number] and showed ejection fraction 30-35% and mild left atrial enlargement. Declined ICD previously. Last echocardiogram June 2021 showed ejection fraction 50 to 38%, grade 1 diastolic dysfunction.  Since last seenshe continues to have dyspnea on exertion unchanged.  No orthopnea, PND or pedal edema.  No chest pain.  She is having "dizzy spells".  These occur with changing positions at times including lowering her head and lying backwards.  She also has had occasions where they last several seconds and resolve spontaneously without warning.  No syncope.  Current Outpatient Medications  Medication Sig Dispense Refill  . albuterol (VENTOLIN HFA) 108 (90 Base) MCG/ACT inhaler INHALE 2 PUFFS BY MOUTH IN EACH NOSTRIL EVERY 4 TO 6 HOURS AS NEEDED 36 g 1  . aspirin (ASPIRIN 81) 81 MG EC tablet Take 81 mg by mouth daily.    Marland Kitchen atorvastatin (LIPITOR) 20 MG tablet TAKE 1 TABLET(20 MG) BY MOUTH DAILY 90 tablet 2  . candesartan (ATACAND) 8 MG tablet Take 1 tablet (8 mg total) by mouth daily. 90 tablet 3  . denosumab (PROLIA) 60 MG/ML SOSY injection Inject 60 mg into the skin every 6 (six) months.    . famotidine (PEPCID) 40 MG tablet TAKE 1 TABLET(40 MG) BY MOUTH EVERY EVENING 30 tablet 5  . fluticasone (FLONASE) 50 MCG/ACT nasal spray Use 1-2 sprays per nostril daily 16 g 5  . Fluticasone-Umeclidin-Vilant (TRELEGY ELLIPTA) 100-62.5-25 MCG/INH AEPB Inhale 1 puff into the lungs daily. 60 each 6  . furosemide (LASIX) 20 MG tablet Take as needed for >3lbs weight gain in one day or >5lbs weight gain in one week; or for swelling 30 tablet 3  . lansoprazole (PREVACID) 30 MG capsule TAKE 1 CAPSULE BY MOUTH DAILY IN THE MORNING 30 capsule 5  . leflunomide (ARAVA) 20 MG tablet Take 20 mg by mouth daily.      . metoprolol succinate (TOPROL-XL) 50 MG 24 hr tablet TAKE 1 TABLET(50 MG) BY MOUTH DAILY 90 tablet 1  . venlafaxine XR (EFFEXOR-XR) 75 MG 24 hr capsule TK 1 C PO QD WF     No current facility-administered medications for this visit.     Past Medical History:  Diagnosis Date  . Abnormal CT scan   . Allergic rhinitis   . Anemia 2015   after lung surgery  . Angio-edema   . Complication of anesthesia    Fentanyl allergy, "claustrophobia"  . COPD (chronic obstructive pulmonary disease) ([place])   . Depression   . Diaphoresis 07/30/2015   Excessive sweating with little exertion  . Difficult intravenous access   . GERD (gastroesophageal reflux disease)    In the past  . Hyperlipidemia   . Hypertension   . Left bundle branch block   . Lung cancer ([place]) 2015   left upper lobe wedge removed-no further tx.  . Neuropathy    Tingling in arms, fingers, and feet (Since 06/15/16)  . Obesity   . OSA (obstructive sleep apnea)    denies.  . Osteoarthritis    ra also  . Osteoporosis   . Recurrent upper respiratory infection (URI)   . Rheumatoid arthritis(714.0)    Dr. Stark Jock follows  . SOB (shortness of breath) on exertion   . Uterine cancer ([place]) 1971   tx surgical    Past Surgical History:  Procedure Laterality Date  . ABDOMINAL HYSTERECTOMY     in situ carcinoma partial  . CARDIAC CATHETERIZATION  01/27/11   minor non-obs CAD, NL EF, mild pulm HTN  . CATARACT EXTRACTION, BILATERAL     last done 12'16-  . COLONOSCOPY W/ BIOPSIES AND POLYPECTOMY    . COLONOSCOPY WITH PROPOFOL N/A 02/13/2015   Procedure: COLONOSCOPY WITH PROPOFOL;  Surgeon: Laurence Spates, MD;  Location: [facility];  Service: Endoscopy;  Laterality: N/A;  . ESOPHAGOGASTRODUODENOSCOPY (EGD) WITH PROPOFOL N/A 09/30/2016   Procedure: ESOPHAGOGASTRODUODENOSCOPY (EGD) WITH PROPOFOL;  Surgeon: Laurence Spates, MD;  Location: [facility];  Service: Endoscopy;  Laterality: N/A;  . LYMPH NODE DISSECTION Left 04/12/2013    Procedure: LYMPH NODE DISSECTION;  Surgeon: [name], MD;  Location: [facility];  Service: Thoracic;  Laterality: Left;  . RIGHT/LEFT HEART CATH AND CORONARY ANGIOGRAPHY N/A 10/08/2016   Procedure: RIGHT/LEFT HEART CATH AND CORONARY ANGIOGRAPHY;  Surgeon: Burnell Blanks, MD;  Location: [facility] [place] CV LAB;  Service: Cardiovascular;  Laterality: N/A;  . TONSILLECTOMY    . VESICOVAGINAL FISTULA CLOSURE W/ TAH  1971  . VIDEO ASSISTED THORACOSCOPY (VATS)/THOROCOTOMY Left 04/12/2013   Procedure: VIDEO ASSISTED THORACOSCOPY (VATS)/THOROCOTOMY, WITH LEFT UPPER LOBE WEDGE RESECTION, CHEST WALL BIOPSY ;  Surgeon: [name], MD;  Location: [facility];  Service: Thoracic;  Laterality: Left;  Marland Kitchen VIDEO BRONCHOSCOPY N/A 04/12/2013   Procedure: VIDEO BRONCHOSCOPY;  Surgeon: [name], MD;  Location: [facility] OR;  Service: Thoracic;  Laterality: N/A;    Social History   Socioeconomic History  . Marital status: Widowed    Spouse name: Not on file  . Number of children: 2  . Years of education: Not on file  . Highest education level: Not on file  Occupational History  . Occupation: Nurse, adult: [employer]  Tobacco Use  . Smoking status: Former Smoker    Packs/day: 1.00    Years: 35.00    Pack years: 35.00    Types: Cigarettes    Quit date: 01/07/1988    Years since quitting: 31.6  . Smokeless tobacco: Never Used  Vaping Use  . Vaping Use: Never used  Substance and Sexual Activity  . Alcohol use: No    Comment: not since 1988  . Drug use: No  . Sexual activity: Never  Other Topics Concern  . Not on file  Social History Narrative  . Not on file   Social Determinants of Health   Financial Resource Strain:   . Difficulty of Paying Living Expenses: Not on file  Food Insecurity: No Food Insecurity  . Worried About Charity fundraiser in the Last Year: Never true  . Ran Out of Food in the Last Year: Never true  Transportation Needs: No Transportation Needs  . Lack of  Transportation (Medical): No  . Lack of Transportation (Non-Medical): No  Physical Activity:   . Days of Exercise per Week: Not on file  . Minutes of Exercise per Session: Not on file  Stress:   . Feeling of Stress : Not on file  Social Connections:   . Frequency of Communication with Friends and Family: Not on file  . Frequency of Social Gatherings with Friends and Family: Not on file  . Attends Religious Services: Not on file  . Active Member of Clubs or Organizations: Not on file  . Attends Archivist Meetings: Not on file  . Marital Status: Not on file  Intimate Partner Violence:   .  Fear of Current or Ex-Partner: Not on file  . Emotionally Abused: Not on file  . Physically Abused: Not on file  . Sexually Abused: Not on file    Family History  Problem Relation Age of Onset  . Lung cancer Mother   . Coronary artery disease Son   . Heart attack Son 58  . Arthritis/Rheumatoid Maternal Grandmother   . Coronary artery disease Son   . Arthritis/Rheumatoid Son     ROS: no fevers or chills, productive cough, hemoptysis, dysphasia, odynophagia, melena, hematochezia, dysuria, hematuria, rash, seizure activity, orthopnea, PND, pedal edema, claudication. Remaining systems are negative.  Physical Exam: Well-developed well-nourished in no acute distress.  Skin is warm and dry.  HEENT is normal.  Neck is supple.  Chest is clear to auscultation with normal expansion.  Cardiovascular exam is regular rate and rhythm.  Abdominal exam nontender or distended. No masses palpated. Extremities show no edema. neuro grossly intact  A/P  1 nonischemic cardiomyopathy-LV function improved on most recent echocardiogram.  Continue ARB and beta-blocker.  She previously declined Entresto.  2 nonobstructive coronary artery disease-continue aspirin and statin.  She denies chest pain.  3 hypertension-blood pressure controlled.  Continue present medical regimen.  4  hyperlipidemia-continue statin.  She recently had a CT scan showing aortic atherosclerosis.  We will increase Lipitor to 40 mg daily.  Check lipids and liver in 12 weeks.  5 chronic combined systolic/diastolic congestive heart failure-she is euvolemic on examination today.  Continue Lasix at present dose.    6 dyspnea-this is felt to be multifactorial including prior lung resection secondary to lung cancer-COPD, obstructive sleep apnea and obesity hypoventilation syndrome.  Filling pressures at time of previous catheterization normal.  7 presyncope-etiology unclear.  Most of symptoms sound likely not to be cardiac.  However she has occasions when she has dizziness without warning.  We will arrange an event monitor to further assess.  Kirk Ruths, MD

## 2019-08-25 DIAGNOSIS — D649 Anemia, unspecified: Secondary | ICD-10-CM | POA: Diagnosis not present

## 2019-08-29 ENCOUNTER — Other Ambulatory Visit: Payer: Self-pay

## 2019-08-29 ENCOUNTER — Ambulatory Visit: Payer: HMO | Admitting: Cardiology

## 2019-08-29 ENCOUNTER — Encounter: Payer: Self-pay | Admitting: *Deleted

## 2019-08-29 ENCOUNTER — Encounter: Payer: Self-pay | Admitting: Cardiology

## 2019-08-29 VITALS — BP 130/82 | HR 90 | Temp 97.0°F | Ht 62.0 in | Wt 176.8 lb

## 2019-08-29 DIAGNOSIS — I2510 Atherosclerotic heart disease of native coronary artery without angina pectoris: Secondary | ICD-10-CM | POA: Diagnosis not present

## 2019-08-29 DIAGNOSIS — I10 Essential (primary) hypertension: Secondary | ICD-10-CM

## 2019-08-29 DIAGNOSIS — R42 Dizziness and giddiness: Secondary | ICD-10-CM | POA: Diagnosis not present

## 2019-08-29 DIAGNOSIS — I428 Other cardiomyopathies: Secondary | ICD-10-CM | POA: Diagnosis not present

## 2019-08-29 DIAGNOSIS — E7800 Pure hypercholesterolemia, unspecified: Secondary | ICD-10-CM | POA: Diagnosis not present

## 2019-08-29 MED ORDER — ATORVASTATIN CALCIUM 40 MG PO TABS: 40.0000 mg | ORAL_TABLET | Freq: Every day | ORAL | 3 refills | Status: DC

## 2019-08-29 NOTE — Progress Notes (Signed)
Patient ID: Taylor Berry, female   DOB: Aug 09, 1940, 79 y.o.   MRN: [id number] Patient enrolled for [employer] to ship a 3 day ZIO XT long term holter monitor to her home.

## 2019-08-29 NOTE — Patient Instructions (Signed)
Medication Instructions:   INCREASE ATORVASTATIN TO 40 MG ONCE DAILY= 2 OF THE 20 MG TABLETS ONCE DAILY  *If you need a refill on your cardiac medications before your next appointment, please call your pharmacy*   Lab Work:  Your physician recommends that you return for lab work in: [place]   If you have labs (blood work) drawn today and your tests are completely normal, you will receive your results only by: Marland Kitchen MyChart Message (if you have MyChart) OR . A paper copy in the mail If you have any lab test that is abnormal or we need to change your treatment, we will call you to review the results.   Testing/Procedures:  [facility]- Long Term Monitor Instructions   Your physician has requested you wear your ZIO patch monitor____3___days.   This is a single patch monitor.  [employer] supplies one patch monitor per enrollment.  Additional stickers are not available.   Please do not apply patch if you will be having a Nuclear Stress Test, Echocardiogram, Cardiac CT, MRI, or Chest Xray during the time frame you would be wearing the monitor. The patch cannot be worn during these tests.  You cannot remove and re-apply the ZIO XT patch monitor.   Your ZIO patch monitor will be sent USPS Priority mail from [facility] directly to your home address. The monitor may also be mailed to a PO BOX if home delivery is not available.   It may take 3-5 days to receive your monitor after you have been enrolled.   Once you have received you monitor, please review enclosed instructions.  Your monitor has already been registered assigning a specific monitor serial # to you.   Applying the monitor   Shave hair from upper left chest.   Hold abrader disc by orange tab.  Rub abrader in 40 strokes over left upper chest as indicated in your monitor instructions.   Clean area with 4 enclosed alcohol pads .  Use all pads to assure are is cleaned thoroughly.  Let dry.   Apply patch as indicated in  monitor instructions.  Patch will be place under collarbone on left side of chest with arrow pointing upward.   Rub patch adhesive wings for 2 minutes.Remove white label marked "1".  Remove white label marked "2".  Rub patch adhesive wings for 2 additional minutes.   While looking in a mirror, press and release button in center of patch.  A small green light will flash 3-4 times .  This will be your only indicator the monitor has been turned on.     Do not shower for the first 24 hours.  You may shower after the first 24 hours.   Press button if you feel a symptom. You will hear a small click.  Record Date, Time and Symptom in the Patient Log Book.   When you are ready to remove patch, follow instructions on last 2 pages of Patient Log Book.  Stick patch monitor onto last page of Patient Log Book.   Place Patient Log Book in [place] box.  Use locking tab on box and tape box closed securely.  The [employer] and [employer] has [employer] on it.  Please place in mailbox as soon as possible.  Your physician should have your test results approximately 7 days after the monitor has been mailed back to [facility].   Call [place] at [phone] if you have questions regarding your ZIO XT patch monitor.  Call them immediately if you see an orange light blinking on your monitor.   If your monitor falls off in less than 4 days contact our Monitor department at [phone].  If your monitor becomes loose or falls off after 4 days call [employer] at [phone] for suggestions on securing your monitor.      Follow-Up: At [facility], you and your health needs are our priority.  As part of our continuing mission to provide you with exceptional heart care, we have created designated Provider Care Teams.  These Care Teams include your primary Cardiologist (physician) and Advanced Practice Providers (APPs -  Physician Assistants and Nurse Practitioners) who all work together to provide  you with the care you need, when you need it.  We recommend signing up for the patient portal called "MyChart".  Sign up information is provided on this After Visit Summary.  MyChart is used to connect with patients for Virtual Visits (Telemedicine).  Patients are able to view lab/test results, encounter notes, upcoming appointments, etc.  Non-urgent messages can be sent to your provider as well.   To learn more about what you can do with MyChart, go to [email or web address].    Your next appointment:   6 month(s)  The format for your next appointment:   In Person  Provider:   You may see Kirk Ruths, MD or one of the following Advanced Practice Providers on your designated Care Team:    Kerin Ransom, PA-C  [place], [place]  Coletta Memos, [place]

## 2019-09-02 ENCOUNTER — Ambulatory Visit (INDEPENDENT_AMBULATORY_CARE_PROVIDER_SITE_OTHER): Payer: HMO

## 2019-09-02 DIAGNOSIS — R42 Dizziness and giddiness: Secondary | ICD-10-CM

## 2019-09-21 ENCOUNTER — Telehealth: Payer: Self-pay | Admitting: *Deleted

## 2019-09-21 MED ORDER — METOPROLOL SUCCINATE ER 25 MG PO TB24: 25.0000 mg | ORAL_TABLET | Freq: Every day | ORAL | 3 refills | Status: DC

## 2019-09-21 NOTE — Telephone Encounter (Signed)
Advised patient, verbalized understanding  

## 2019-09-21 NOTE — Telephone Encounter (Signed)
-----   Message from [name], MD sent at 09/20/2019 10:25 AM EDT ----- HR running low at times; decrease toprol to 25 mg daily. Taylor Berry

## 2019-09-23 DIAGNOSIS — H90A31 Mixed conductive and sensorineural hearing loss, unilateral, right ear with restricted hearing on the contralateral side: Secondary | ICD-10-CM | POA: Diagnosis not present

## 2019-09-23 DIAGNOSIS — H90A22 Sensorineural hearing loss, unilateral, left ear, with restricted hearing on the contralateral side: Secondary | ICD-10-CM | POA: Diagnosis not present

## 2019-09-26 DIAGNOSIS — F39 Unspecified mood [affective] disorder: Secondary | ICD-10-CM | POA: Diagnosis not present

## 2019-09-26 DIAGNOSIS — Z0000 Encounter for general adult medical examination without abnormal findings: Secondary | ICD-10-CM | POA: Diagnosis not present

## 2019-09-26 DIAGNOSIS — J449 Chronic obstructive pulmonary disease, unspecified: Secondary | ICD-10-CM | POA: Diagnosis not present

## 2019-09-26 DIAGNOSIS — I272 Pulmonary hypertension, unspecified: Secondary | ICD-10-CM | POA: Diagnosis not present

## 2019-09-26 DIAGNOSIS — D649 Anemia, unspecified: Secondary | ICD-10-CM | POA: Diagnosis not present

## 2019-09-26 DIAGNOSIS — M810 Age-related osteoporosis without current pathological fracture: Secondary | ICD-10-CM | POA: Diagnosis not present

## 2019-09-26 DIAGNOSIS — Z23 Encounter for immunization: Secondary | ICD-10-CM | POA: Diagnosis not present

## 2019-09-26 DIAGNOSIS — I110 Hypertensive heart disease with heart failure: Secondary | ICD-10-CM | POA: Diagnosis not present

## 2019-09-26 DIAGNOSIS — E7800 Pure hypercholesterolemia, unspecified: Secondary | ICD-10-CM | POA: Diagnosis not present

## 2019-09-26 DIAGNOSIS — I5020 Unspecified systolic (congestive) heart failure: Secondary | ICD-10-CM | POA: Diagnosis not present

## 2019-09-26 DIAGNOSIS — E559 Vitamin D deficiency, unspecified: Secondary | ICD-10-CM | POA: Diagnosis not present

## 2019-09-26 DIAGNOSIS — I10 Essential (primary) hypertension: Secondary | ICD-10-CM | POA: Diagnosis not present

## 2019-11-04 DIAGNOSIS — H903 Sensorineural hearing loss, bilateral: Secondary | ICD-10-CM | POA: Diagnosis not present

## 2019-11-15 DIAGNOSIS — E669 Obesity, unspecified: Secondary | ICD-10-CM | POA: Diagnosis not present

## 2019-11-15 DIAGNOSIS — M810 Age-related osteoporosis without current pathological fracture: Secondary | ICD-10-CM | POA: Diagnosis not present

## 2019-11-15 DIAGNOSIS — R29898 Other symptoms and signs involving the musculoskeletal system: Secondary | ICD-10-CM | POA: Diagnosis not present

## 2019-11-15 DIAGNOSIS — Z79899 Other long term (current) drug therapy: Secondary | ICD-10-CM | POA: Diagnosis not present

## 2019-11-15 DIAGNOSIS — M150 Primary generalized (osteo)arthritis: Secondary | ICD-10-CM | POA: Diagnosis not present

## 2019-11-15 DIAGNOSIS — M0589 Other rheumatoid arthritis with rheumatoid factor of multiple sites: Secondary | ICD-10-CM | POA: Diagnosis not present

## 2019-11-15 DIAGNOSIS — R232 Flushing: Secondary | ICD-10-CM | POA: Diagnosis not present

## 2019-11-15 DIAGNOSIS — R682 Dry mouth, unspecified: Secondary | ICD-10-CM | POA: Diagnosis not present

## 2019-11-15 DIAGNOSIS — M06322 Rheumatoid nodule, left elbow: Secondary | ICD-10-CM | POA: Diagnosis not present

## 2019-11-15 DIAGNOSIS — E785 Hyperlipidemia, unspecified: Secondary | ICD-10-CM | POA: Diagnosis not present

## 2019-11-15 DIAGNOSIS — Z6830 Body mass index (BMI) 30.0-30.9, adult: Secondary | ICD-10-CM | POA: Diagnosis not present

## 2019-11-25 DIAGNOSIS — E7800 Pure hypercholesterolemia, unspecified: Secondary | ICD-10-CM | POA: Diagnosis not present

## 2019-11-25 DIAGNOSIS — E559 Vitamin D deficiency, unspecified: Secondary | ICD-10-CM | POA: Diagnosis not present

## 2019-11-28 ENCOUNTER — Other Ambulatory Visit: Payer: Self-pay

## 2019-11-28 NOTE — Patient Outreach (Signed)
[facility]) Care Management Chronic Special Needs Program  11/28/2019  Name: Taylor Berry DOB: [birth year]  MRN: [id number]  Ms. Taylor Berry is enrolled in a chronic special needs plan for Heart Failure. RNCM called to follow up, review and update care plan.   Subjective: client reports she attends provider visits as scheduled. She states, she continues to have "some shortness of breath", but reports a combination of lung disease(history of lung resection due to lung cancer-COPD) and rheumatoid arthritis. she reports she had recent labs last week and will be following up with primary care provider. She also report last visit with cardiologist 08/29/19 and cardiologist completed Zio patch study and continues to follow. Client [facility]. She denies signs/symptoms of congestive heart failure exacerbations. She reports she is weighing self daily and denies any weight gain. Client reports she knows when to call the doctor and will call if she has any questions or concerns. Client reports she continues to monitor salt intake and take medications as prescribed. She states she is also communicating with [employer] health coach regarding her weight loss goal.  Client ask about resources for house cleaning/personal care service.   Goals Addressed            This Visit's Progress   . Client verbalize knowledge of Heart Failure disease self management skills within the next 6-9 months.   On track    Discussed signs/symptoms of heart failure exacerbation and when to call the doctor. Discussed daily weights-reports she is weighing daily and that there has been no increase in weight. Call your provider if you gain 3 pounds overnight or 5 pounds in a week. Continue to follow a low salt mail plan, read food labels for the salt content.     . Client will report no worsening of symptoms related to heart disease within  the next 6-9 months    On track    Discussed appointment with cardiologist. Continue to be aware of what is normal for you. Notify your rovider for symptoms of chest pain, sweating, nausea/vomiting, irregular heartbeat, palpitations, rapid heart rate, shortness of breath, dizziness or fainting. Call 911 for severe symptoms of chest pain or shortness of breath. Take medications as prescribed. Follow up with your Cardiologist (heart doctor) as recommended.      . COMPLETED: Client will verbalize knowledge of chronic lung disease as evidenced by no ED visits or Inpatient stays related to chronic lung disease        No hospitalizations or emergency room visits due to chronic lung disease. Continue to take medications as prescribed and attend provider visits as scheduled. Call provider as needed.    . COMPLETED: Client will verbalize knowledge of self management of Hypertension as evidences by BP reading of 140/90 or less; or as defined by provider       Reports blood pressure managed good. Last noted blood pressure 130/82 on 08/29/19 and 124/86 on 09/26/2019.    Marland Kitchen COMPLETED: Obtain annual  Lipid Profile, LDL-C       Done 11/25/2019.    Marland Kitchen Patient Stated: client will reports communication with Education officer, museum.   On track    Request if any personal care service/house keeping resources available. Referral made to [place] worker.    . Patient Stated:loose 15 pounds   On track    Discussed weight loss goal. Client reports maintaining current weight. Continue to work with [employer]  for weight loss goals.      . COMPLETED: Visit Primary Care Provider or Cardiologist at least 2 times per year       Continue to attend provider visit as scheduled. Reports has followed up with provider as recommended.       Plan: social work referral; send updated care plan to client; send updated care plan to primary care provider. [facility] Management will continue to  provide services for this member through 01/06/2020. The [employer] Care Management Team will assume care 01/07/2020.    Thea Silversmith, RN, MSN, [place] [phone]

## 2019-11-29 ENCOUNTER — Encounter: Payer: Self-pay | Admitting: Allergy and Immunology

## 2019-11-29 ENCOUNTER — Ambulatory Visit (INDEPENDENT_AMBULATORY_CARE_PROVIDER_SITE_OTHER): Payer: HMO | Admitting: Allergy and Immunology

## 2019-11-29 ENCOUNTER — Other Ambulatory Visit: Payer: Self-pay

## 2019-11-29 VITALS — BP 136/90 | HR 70 | Resp 20 | Ht 61.25 in | Wt 177.2 lb

## 2019-11-29 DIAGNOSIS — J449 Chronic obstructive pulmonary disease, unspecified: Secondary | ICD-10-CM

## 2019-11-29 DIAGNOSIS — R197 Diarrhea, unspecified: Secondary | ICD-10-CM

## 2019-11-29 DIAGNOSIS — J3089 Other allergic rhinitis: Secondary | ICD-10-CM | POA: Diagnosis not present

## 2019-11-29 DIAGNOSIS — J4489 Other specified chronic obstructive pulmonary disease: Secondary | ICD-10-CM

## 2019-11-29 DIAGNOSIS — K219 Gastro-esophageal reflux disease without esophagitis: Secondary | ICD-10-CM

## 2019-11-29 NOTE — Addendum Note (Signed)
Addended by: Luretha Rued on: 11/29/2019 02:57 PM   Modules accepted: Orders

## 2019-11-29 NOTE — Patient Instructions (Signed)
  1.  Continue to treat reflux and LPR with the following combination:   A. Prevacid 30mg  1 time per day in AM  B. Famotidine 40mg  1 time per day in PM  2.  Continue flonase -1-2 sprays each nostril 1 time per day  3.  Continue Trelegy 100 - 1 inhalation 1 time per day  4.  If needed:   A. Pro-air HFA 2 inhalations every 4-6 hours.  B. Colestipol 1 g tablet 1-2 times a day for diarrhea  5.  Return to clinic in 6 months or earlier if problem

## 2019-11-29 NOTE — Progress Notes (Signed)
[facility]   Follow-up Note  Referring Provider: Kelton Pillar, MD Primary Provider: Kelton Pillar, MD Date of Office Visit: 11/29/2019  Subjective:   Taylor Berry (DOB: [birth year]) is a 79 y.o. female who returns to the [place] on 11/29/2019 in re-evaluation of the following:  HPI: Arrie presents to this clinic in evaluation of COPD with asthma, allergic rhinitis, LPR, and colestipol responsive diarrhea.  I last saw her in this clinic 24 May 2019.  She has had an excellent interval time regarding her airway.  She will get short of breath if she exerts herself to any degree but for the most part she rarely uses a short acting bronchodilator and she has not required a systemic steroid or an antibiotic for any type of airway issue while she consistently uses a combination of Trelegy and nasal steroid.  She has had very little issues with her reflux or her throat issue while consistently using a proton pump inhibitor and H2 receptor blocker.  Her chronic diarrhea, treated successfully with colestipol in the past, appears to have modified significantly and now she uses colestipol intermittently.  She has received 3 Pfizer Covid vaccine and the flu vaccine this year.  Allergies as of 11/29/2019      Reactions   Latex    REACTION: itching   Ace Inhibitors    REACTION: cough   Fentanyl    Hallucination "I go crazy"    Hydrochlorothiazide    REACTION: decreased sodium   Penicillins Other (See Comments)   REACTION: intolerant due to yeast infection Has patient had a PCN reaction causing immediate rash, facial/tongue/throat swelling, SOB or lightheadedness with hypotension:No Has patient had a PCN reaction causing severe rash involving mucus membranes or skin necrosis: No Has patient had a PCN reaction that required hospitalization No Has patient had a PCN reaction occurring within the last 10 years: Yes If all  of the above answers are "NO", then may proceed with Cephalosporin use.      Medication List      albuterol 108 (90 Base) MCG/ACT inhaler Commonly known as: VENTOLIN HFA INHALE 2 PUFFS BY MOUTH IN EACH NOSTRIL EVERY 4 TO 6 HOURS AS NEEDED   Aspirin 81 81 MG EC tablet Generic drug: aspirin Take 81 mg by mouth daily.   atorvastatin 40 MG tablet Commonly known as: LIPITOR Take 1 tablet (40 mg total) by mouth daily.   candesartan 8 MG tablet Commonly known as: ATACAND Take 1 tablet (8 mg total) by mouth daily.   denosumab 60 MG/ML Sosy injection Commonly known as: PROLIA Inject 60 mg into the skin every 6 (six) months.   famotidine 40 MG tablet Commonly known as: PEPCID TAKE 1 TABLET(40 MG) BY MOUTH EVERY EVENING   fluticasone 50 MCG/ACT nasal spray Commonly known as: FLONASE Use 1-2 sprays per nostril daily   furosemide 20 MG tablet Commonly known as: LASIX Take as needed for >3lbs weight gain in one day or >5lbs weight gain in one week; or for swelling   lansoprazole 30 MG capsule Commonly known as: PREVACID TAKE 1 CAPSULE BY MOUTH DAILY IN THE MORNING   leflunomide 20 MG tablet Commonly known as: ARAVA Take 20 mg by mouth daily.   metoprolol succinate 25 MG 24 hr tablet Commonly known as: TOPROL-XL Take 1 tablet (25 mg total) by mouth daily.   Trelegy Ellipta 100-62.5-25 MCG/INH Aepb Generic drug: Fluticasone-Umeclidin-Vilant Inhale 1 puff into the  lungs daily.   venlafaxine XR 75 MG 24 hr capsule Commonly known as: EFFEXOR-XR TK 1 C PO QD WF       Past Medical History:  Diagnosis Date  . Abnormal CT scan   . Allergic rhinitis   . Anemia 2015   after lung surgery  . Angio-edema   . Complication of anesthesia    Fentanyl allergy, "claustrophobia"  . COPD (chronic obstructive pulmonary disease) ([place])   . Depression   . Diaphoresis 07/30/2015   Excessive sweating with little exertion  . Difficult intravenous access   . GERD (gastroesophageal  reflux disease)    In the past  . Hyperlipidemia   . Hypertension   . Left bundle branch block   . Lung cancer ([place]) 2015   left upper lobe wedge removed-no further tx.  . Neuropathy    Tingling in arms, fingers, and feet (Since 06/15/16)  . Obesity   . OSA (obstructive sleep apnea)    denies.  . Osteoarthritis    ra also  . Osteoporosis   . Recurrent upper respiratory infection (URI)   . Rheumatoid arthritis(714.0)    Dr. Stark Jock follows  . SOB (shortness of breath) on exertion   . Uterine cancer ([place]) 1971   tx surgical    Past Surgical History:  Procedure Laterality Date  . ABDOMINAL HYSTERECTOMY     in situ carcinoma partial  . CARDIAC CATHETERIZATION  01/27/11   minor non-obs CAD, NL EF, mild pulm HTN  . CATARACT EXTRACTION, BILATERAL     last done 12'16-  . COLONOSCOPY W/ BIOPSIES AND POLYPECTOMY    . COLONOSCOPY WITH PROPOFOL N/A 02/13/2015   Procedure: COLONOSCOPY WITH PROPOFOL;  Surgeon: Laurence Spates, MD;  Location: [facility];  Service: Endoscopy;  Laterality: N/A;  . ESOPHAGOGASTRODUODENOSCOPY (EGD) WITH PROPOFOL N/A 09/30/2016   Procedure: ESOPHAGOGASTRODUODENOSCOPY (EGD) WITH PROPOFOL;  Surgeon: Laurence Spates, MD;  Location: [facility];  Service: Endoscopy;  Laterality: N/A;  . LYMPH NODE DISSECTION Left 04/12/2013   Procedure: LYMPH NODE DISSECTION;  Surgeon: [name], MD;  Location: [place];  Service: Thoracic;  Laterality: Left;  . RIGHT/LEFT HEART CATH AND CORONARY ANGIOGRAPHY N/A 10/08/2016   Procedure: RIGHT/LEFT HEART CATH AND CORONARY ANGIOGRAPHY;  Surgeon: Burnell Blanks, MD;  Location: [place] [facility];  Service: Cardiovascular;  Laterality: N/A;  . TONSILLECTOMY    . VESICOVAGINAL FISTULA CLOSURE W/ TAH  1971  . VIDEO ASSISTED THORACOSCOPY (VATS)/THOROCOTOMY Left 04/12/2013   Procedure: VIDEO ASSISTED THORACOSCOPY (VATS)/THOROCOTOMY, WITH LEFT UPPER LOBE WEDGE RESECTION, CHEST WALL BIOPSY ;  Surgeon: [name], MD;  Location: [place];  Service: Thoracic;  Laterality: Left;  Marland Kitchen VIDEO BRONCHOSCOPY N/A 04/12/2013   Procedure: VIDEO BRONCHOSCOPY;  Surgeon: [name], MD;  Location: [facility] OR;  Service: Thoracic;  Laterality: N/A;    Review of systems negative except as noted in HPI / PMHx or noted below:  Review of Systems  Constitutional: Negative.   HENT: Negative.   Eyes: Negative.   Respiratory: Negative.   Cardiovascular: Negative.   Gastrointestinal: Negative.   Genitourinary: Negative.   Musculoskeletal: Negative.   Skin: Negative.   Neurological: Negative.   Endo/Heme/Allergies: Negative.   Psychiatric/Behavioral: Negative.      Objective:   Vitals:   11/29/19 1144  BP: 136/90  Pulse: 70  Resp: 20  SpO2: 96%   Height: 5' 1.25" (155.6 cm)  Weight: 177 lb 3.2 oz (80.4 kg)   Physical Exam Constitutional:  Appearance: She is not diaphoretic.  HENT:     Head: Normocephalic.     Right Ear: Tympanic membrane, ear canal and external ear normal.     Left Ear: Tympanic membrane, ear canal and external ear normal.     Nose: Nose normal. No mucosal edema or rhinorrhea.     Mouth/Throat:     Pharynx: Uvula midline. No oropharyngeal exudate.  Eyes:     Conjunctiva/sclera: Conjunctivae normal.  Neck:     Thyroid: No thyromegaly.     Trachea: Trachea normal. No tracheal tenderness or tracheal deviation.  Cardiovascular:     Rate and Rhythm: Normal rate and regular rhythm.     Heart sounds: Normal heart sounds, S1 normal and S2 normal. No murmur heard.   Pulmonary:     Effort: No respiratory distress.     Breath sounds: Normal breath sounds. No stridor. No wheezing or rales.  Lymphadenopathy:     Head:     Right side of head: No tonsillar adenopathy.     Left side of head: No tonsillar adenopathy.     Cervical: No cervical adenopathy.  Skin:    Findings: No erythema or rash.     Nails: There is no clubbing.  Neurological:     Mental Status: She is alert.     Diagnostics:      Spirometry was performed and demonstrated an FEV1 of 0.97 at 55 % of predicted.  Assessment and Plan:   1. COPD with asthma ([place])   2. Perennial allergic rhinitis   3. LPRD (laryngopharyngeal reflux disease)   4. Diarrhea, unspecified type     1.  Continue to treat reflux and LPR with the following combination:   A. Prevacid 30mg  1 time per day in AM  B. Famotidine 40mg  1 time per day in PM  2.  Continue flonase -1-2 sprays each nostril 1 time per day  3.  Continue Trelegy 100 - 1 inhalation 1 time per day  4.  If needed:   A. Pro-air HFA 2 inhalations every 4-6 hours.  B. Colestipol 1 g tablet 1-2 times a day for diarrhea  5.  Return to clinic in 6 months or earlier if problem   Darrah is really doing very well on her current plan and I would like for her to consistently use Prevacid and famotidine to address her LPR and Flonase and triple inhaler to address her respiratory tract inflammation and if needed colestipol for her diarrhea.  We will see her back in this clinic in 6 months while she utilizes this plan or earlier if there is a problem.  Allena Katz, MD Allergy / Immunology [place]

## 2019-11-30 ENCOUNTER — Encounter: Payer: Self-pay | Admitting: Allergy and Immunology

## 2019-12-05 ENCOUNTER — Telehealth: Payer: Self-pay | Admitting: Allergy and Immunology

## 2019-12-05 NOTE — Telephone Encounter (Signed)
Patient informed me that she has not met the out of pocket minimum for her plan and is not able to get any more Trelegy from the assistance program. I did give her a couple of samples and requested a call back when she gets low so that we discuss options with the provider. Patient is agreeable with this plan.  

## 2019-12-05 NOTE — Telephone Encounter (Signed)
Patient called and is out of trelegy ellipta and she is on the patient assist program and they said she did not spend enough. [phone]

## 2019-12-06 ENCOUNTER — Encounter: Payer: Self-pay | Admitting: *Deleted

## 2019-12-06 ENCOUNTER — Other Ambulatory Visit: Payer: Self-pay | Admitting: *Deleted

## 2019-12-06 NOTE — Patient Outreach (Signed)
Received a [place] referral.  I posted a referral ticket in the [employer] portal.    Ticket saved successfully with Tracking ID: [id number]

## 2019-12-06 NOTE — Patient Outreach (Signed)
[facility] Care Management  12/06/2019  Taylor Berry 19-Apr-1940 [id number]  CSW was able to make initial contact with patient today to perform phone assessment, as well as assess and assist with social work needs and services.  CSW introduced self, explained role and types of services provided through [facility] ([facility]).  CSW further explained to patient that CSW works with patient's Chronic Special Needs Plan RNCM, also with [facility], Thea Silversmith.  CSW then explained the reason for the call, indicating that Ms. Juleen China thought that patient would benefit from social work services and resources to assist with arranging in-home care services.  CSW obtained two HIPAA compliant identifiers from patient, which included patient's name and date of birth.  Patient admitted that she is able to perform all activities of daily living independently, and is still able to drive herself to and from all of her physician appointments.  Patient reported that she is really only interested in having someone come into her home to provide light house-keeping duties, as she indicated that she does not have the strength or energy to perform this task herself.  CSW explained to patient that insurance does not pay for light-housekeeping duties, and that this would be an out-of-pocket expense for her.  Patient voiced understanding and was agreeable to having CSW e-mail ([email or web address]) her a list of resources.  With that being said, patient is aware that CSW will be e-mailing her the following list of resources: [place], [place], [employer] [facility] and [employer].  Patient was agreeable to having CSW follow-up with her again next week, on Tuesday, December 13, 2019, around 11:00AM, to review the list of resources with her, answer any questions that she may have pertaining to  the information received, and assist with the referral process, if necessary.  CSW performed a financial assessment with patient, determining that she exceeds the income guidelines to be eligible for Adult Medicaid, through the [employer].  Patient reported that her son, Karisa Nesser is usually able to assist her with in-home care needs, but he recently broke his wrist and is in a cast.  Patient requested that CSW also assist her with obtaining Trelegy Ellipta at a much reduced cost, as she reported that she is no longer eligible to receive financial assistance through [place].  Patient went on to explain that a representative with [employer] told her that she must meet a $600.00 out-of-pocket medication deductible, per fiscal year, in order to qualify for financial assistance.  Patient indicated that the requirements changed when she switched her Medicare coverage to [employer].  Patient stated, "The advantage is that I do not have to pay a co-pay for any of my other prescription medications, but the Trelegy Ellipta will cost me close to $200.00 per month".  Patient further reported that she has already tried several other medications, similar to [employer], but that none of them worked as well as the [employer]; therefore, she is reluctant to try something new.  Patient received the Trelegy Ellipta for 2 years, free of charge, through [place], but ran out of her prescription last Friday (December 02, 2019), having to go without for 4 days before receiving a 60 day supply of samples from Dr. Allena Katz, Allergy and Asthma Specialist yesterday (Monday, December 05, 2019).  CSW agreed to place a referral to a Scientist, clinical (histocompatibility and immunogenetics)  Pharmacist to see if patient qualifies for any additional patient assistance programs.  CSW was able to confirm that patient has the correct contact information for CSW, encouraging her to  contact CSW directly if additional social work needs arise in the meantime.     Nat Christen, BSW, MSW, LCSW  Licensed Education officer, environmental Health System  Mailing [place] [address], [place], [place] [address] Physical Address-[address] [place], [place] [address] Toll Free Main # [phone] Fax # [phone] Cell # [phone]  [email or web address]

## 2019-12-07 ENCOUNTER — Other Ambulatory Visit: Payer: Self-pay

## 2019-12-07 NOTE — Patient Outreach (Signed)
  [facility]) Care Management Chronic Special Needs Program    12/07/2019  Name: ANISHKA BUSHARD, DOB: [birth year]  MRN: [id number]   Ms. Eli Adami is enrolled in a chronic special needs plan. RNCM received notification from [facility] social worker that client request medication assistance with Trelegy Ellipta. Referral sent to [facility] pharmacist.  Plan: [facility] Management will continue to provide services for this member through 01/06/2020. The [employer] Care Management Team will assume care 01/07/2020.  Thea Silversmith, RN, MSN, [place] [phone]

## 2019-12-08 ENCOUNTER — Ambulatory Visit: Payer: Self-pay

## 2019-12-09 ENCOUNTER — Other Ambulatory Visit: Payer: Self-pay | Admitting: Cardiology

## 2019-12-09 DIAGNOSIS — I428 Other cardiomyopathies: Secondary | ICD-10-CM

## 2019-12-09 DIAGNOSIS — I10 Essential (primary) hypertension: Secondary | ICD-10-CM

## 2019-12-12 ENCOUNTER — Telehealth: Payer: Self-pay | Admitting: Cardiology

## 2019-12-12 NOTE — Telephone Encounter (Signed)
Follow Up:    Pt said she received a letter and was supposed to call and talk to you.

## 2019-12-12 NOTE — Telephone Encounter (Signed)
Spoke with pt, she reports she had lab work drawn at [facility]. I will call and get those results.

## 2019-12-13 ENCOUNTER — Encounter: Payer: Self-pay | Admitting: *Deleted

## 2019-12-13 ENCOUNTER — Other Ambulatory Visit: Payer: Self-pay | Admitting: *Deleted

## 2019-12-13 NOTE — Telephone Encounter (Signed)
Labs requested from [facility].

## 2019-12-13 NOTE — Patient Outreach (Signed)
[facility]) Care Management  12/13/2019  Taylor Berry 02-Dec-1940 [id number]   CSW was able to make contact with patient today to follow-up regarding social work services and resources, as well as to ensure that patient received the packet of resource information that CSW e-mailed to her home last week, per her request.  Patient confirmed receipt, admitting that she has already been calling around to various in-home care and respite agencies, and home health care agencies, to obtain price quotes and availability.  Patient denied the need for assistance with this process.  Patient further reported that she has been assigned a Pharmacist, also with [facility], via the order that CSW placed last week, to assist her with obtaining needed medications and possible financial assistance.  CSW will perform a case closure on patient, as all goals of treatment have been met from social work standpoint and no additional social work needs have been identified at this time.  CSW will notify patient's Chronic Special Needs Plan RNCM with [facility], Thea Silversmith of CSW's plans to close patient's case.  CSW will fax an update to patient's Primary Care Physician, Dr. Kelton Pillar to ensure that she is aware of CSW's involvement with patient's plan of care, in addition to routing a Physician Case Closure Letter to Dr. Laurann Montana.  CSW was able to confirm that patient has the correct contact information for CSW, encouraging her to contact CSW directly if additional social work needs arise in the near future.  Nat Christen, BSW, MSW, LCSW  Licensed Education officer, environmental Health System  Mailing [place] [address], [place], [place] [address] Physical Address-[address], [place], [place] [address] Toll Free Main # [phone] Fax # [phone] Cell # [phone]   [email or web address]

## 2019-12-22 ENCOUNTER — Other Ambulatory Visit: Payer: Self-pay

## 2019-12-22 NOTE — Patient Outreach (Signed)
  [facility]) Care Management Chronic Special Needs Program    12/22/2019  Name: Taylor Berry, DOB: October 07, 1940  MRN: [id number]   Taylor Berry is enrolled in a chronic special needs plan for Heart Failure. Per Social work note-client has been in contact with pharmacist regarding medication assistance. [place] Management will continue to provide services for this member through 01/06/2020. The [employer] Care Management Team will assume care 01/07/2020.  Thea Silversmith, RN, MSN, [place] [phone]

## 2019-12-27 ENCOUNTER — Encounter: Payer: Self-pay | Admitting: Allergy and Immunology

## 2019-12-27 NOTE — Telephone Encounter (Signed)
I spoke with patient about giving samples in between as this is more than likely the cheapest alternative for an inhaler on her plan. She is agreeable with this and will see how it goes.

## 2020-01-13 ENCOUNTER — Other Ambulatory Visit: Payer: Self-pay

## 2020-02-14 NOTE — Progress Notes (Signed)
HPI: FU CM. Pt withH/O dyspnea; also with h/o lung ca, COPD and OSA. Cardiac catheterization October 2018 showed mild nonobstructive coronary disease and normal filling pressures. Echocardiogram repeated [phone] and showed ejection fraction 30-35% and mild left atrial enlargement. Declined ICD previously.Last echocardiogram June 2021 showed ejection fraction 50 to 26%, grade 1 diastolic dysfunction. Monitor September 2021 showed sinus bradycardia, normal sinus rhythm, occasional PAC, 4 and 6 beats of PAT and occasional PVC. Toprol was decreased due to bradycardia.  Since last seenshe has dyspnea with more vigorous activities.  No orthopnea or PND.  She has resumed taking Lasix for lower extremity edema.  She denies chest pain or syncope.  Current Outpatient Medications  Medication Sig Dispense Refill  . albuterol (VENTOLIN HFA) 108 (90 Base) MCG/ACT inhaler INHALE 2 PUFFS BY MOUTH IN EACH NOSTRIL EVERY 4 TO 6 HOURS AS NEEDED 36 g 1  . aspirin 81 MG EC tablet Take 81 mg by mouth daily.    Marland Kitchen atorvastatin (LIPITOR) 40 MG tablet Take 1 tablet (40 mg total) by mouth daily. 90 tablet 3  . candesartan (ATACAND) 8 MG tablet TAKE 1 TABLET(8 MG) BY MOUTH DAILY 90 tablet 3  . denosumab (PROLIA) 60 MG/ML SOSY injection Inject 60 mg into the skin every 6 (six) months.    . famotidine (PEPCID) 40 MG tablet TAKE 1 TABLET(40 MG) BY MOUTH EVERY EVENING 30 tablet 5  . fluticasone (FLONASE) 50 MCG/ACT nasal spray Use 1-2 sprays per nostril daily 16 g 5  . Fluticasone-Umeclidin-Vilant (TRELEGY ELLIPTA) 100-62.5-25 MCG/INH AEPB Inhale 1 puff into the lungs daily. 60 each 6  . furosemide (LASIX) 20 MG tablet Take as needed for >3lbs weight gain in one day or >5lbs weight gain in one week; or for swelling 30 tablet 3  . lansoprazole (PREVACID) 30 MG capsule TAKE 1 CAPSULE BY MOUTH DAILY IN THE MORNING 30 capsule 5  . leflunomide (ARAVA) 20 MG tablet Take 20 mg by mouth daily.    . metoprolol succinate  (TOPROL-XL) 25 MG 24 hr tablet Take 1 tablet (25 mg total) by mouth daily. 90 tablet 3  . predniSONE (DELTASONE) 1 MG tablet Take 1 mg by mouth daily with breakfast.    . venlafaxine XR (EFFEXOR-XR) 75 MG 24 hr capsule Take 75 mg by mouth daily.     No current facility-administered medications for this visit.     Past Medical History:  Diagnosis Date  . Abnormal CT scan   . Allergic rhinitis   . Anemia 2015   after lung surgery  . Angio-edema   . Complication of anesthesia    Fentanyl allergy, "claustrophobia"  . COPD (chronic obstructive pulmonary disease) ([place])   . Depression   . Diaphoresis 07/30/2015   Excessive sweating with little exertion  . Difficult intravenous access   . GERD (gastroesophageal reflux disease)    In the past  . Hyperlipidemia   . Hypertension   . Left bundle branch block   . Lung cancer ([place]) 2015   left upper lobe wedge removed-no further tx.  . Neuropathy    Tingling in arms, fingers, and feet (Since 06/15/16)  . Obesity   . OSA (obstructive sleep apnea)    denies.  . Osteoarthritis    ra also  . Osteoporosis   . Recurrent upper respiratory infection (URI)   . Rheumatoid arthritis(714.0)    Dr. Stark Jock follows  . SOB (shortness of breath) on exertion   . Uterine cancer ([place]) 1971  tx surgical    Past Surgical History:  Procedure Laterality Date  . ABDOMINAL HYSTERECTOMY     in situ carcinoma partial  . CARDIAC CATHETERIZATION  01/27/11   minor non-obs CAD, NL EF, mild pulm HTN  . CATARACT EXTRACTION, BILATERAL     last done 12'16-  . COLONOSCOPY W/ BIOPSIES AND POLYPECTOMY    . COLONOSCOPY WITH PROPOFOL N/A 02/13/2015   Procedure: COLONOSCOPY WITH PROPOFOL;  Surgeon: Laurence Spates, MD;  [facility];  Service: Endoscopy;  Laterality: N/A;  . ESOPHAGOGASTRODUODENOSCOPY (EGD) WITH PROPOFOL N/A 09/30/2016   Procedure: ESOPHAGOGASTRODUODENOSCOPY (EGD) WITH PROPOFOL;  Surgeon: Laurence Spates, MD;  [facility];  Service:  Endoscopy;  Laterality: N/A;  . LYMPH NODE DISSECTION Left 04/12/2013   Procedure: LYMPH NODE DISSECTION;  Surgeon: [name], MD;  Location: [facility];  Service: Thoracic;  Laterality: Left;  . RIGHT/LEFT HEART CATH AND CORONARY ANGIOGRAPHY N/A 10/08/2016   Procedure: RIGHT/LEFT HEART CATH AND CORONARY ANGIOGRAPHY;  Surgeon: Burnell Blanks, MD;  Location: [facility] [facility];  Service: Cardiovascular;  Laterality: N/A;  . TONSILLECTOMY    . VESICOVAGINAL FISTULA CLOSURE W/ TAH  1971  . VIDEO ASSISTED THORACOSCOPY (VATS)/THOROCOTOMY Left 04/12/2013   Procedure: VIDEO ASSISTED THORACOSCOPY (VATS)/THOROCOTOMY, WITH LEFT UPPER LOBE WEDGE RESECTION, CHEST WALL BIOPSY ;  Surgeon: [name], MD;  Location: [facility];  Service: Thoracic;  Laterality: Left;  Marland Kitchen VIDEO BRONCHOSCOPY N/A 04/12/2013   Procedure: VIDEO BRONCHOSCOPY;  Surgeon: [name], MD;  Location: [facility] OR;  Service: Thoracic;  Laterality: N/A;    Social History   Socioeconomic History  . Marital status: Widowed    Spouse name: Not on file  . Number of children: 2  . Years of education: College  . Highest education level: Master's degree (e.g., MA, MS, MEng, MEd, MSW, MBA)  Occupational History  . Occupation: Nurse, adult: [employer]  Tobacco Use  . Smoking status: Former Smoker    Packs/day: 1.00    Years: 35.00    Pack years: 35.00    Types: Cigarettes    Quit date: 01/07/1988    Years since quitting: 32.1  . Smokeless tobacco: Never Used  Vaping Use  . Vaping Use: Never used  Substance and Sexual Activity  . Alcohol use: No    Comment: not since 1988  . Drug use: No  . Sexual activity: Never  Other Topics Concern  . Not on file  Social History Narrative  . Not on file   Social Determinants of Health   Financial Resource Strain: Low Risk   . Difficulty of Paying Living Expenses: Not hard at all  Food Insecurity: No Food Insecurity  . Worried About Charity fundraiser in the Last Year: Never true   . Ran Out of Food in the Last Year: Never true  Transportation Needs: No Transportation Needs  . Lack of Transportation (Medical): No  . Lack of Transportation (Non-Medical): No  Physical Activity: Inactive  . Days of Exercise per Week: 0 days  . Minutes of Exercise per Session: 0 min  Stress: No Stress Concern Present  . Feeling of Stress : Not at all  Social Connections: Moderately Integrated  . Frequency of Communication with Friends and Family: More than three times a week  . Frequency of Social Gatherings with Friends and Family: More than three times a week  . Attends Religious Services: More than 4 times per year  . Active Member of Clubs or  Organizations: Yes  . Attends Archivist Meetings: 1 to 4 times per year  . Marital Status: Widowed  Intimate Partner Violence: Not At Risk  . Fear of Current or Ex-Partner: No  . Emotionally Abused: No  . Physically Abused: No  . Sexually Abused: No    Family History  Problem Relation Age of Onset  . Lung cancer Mother   . Coronary artery disease Son   . Heart attack Son 40  . Arthritis/Rheumatoid Maternal Grandmother   . Coronary artery disease Son   . Arthritis/Rheumatoid Son     ROS: no fevers or chills, productive cough, hemoptysis, dysphasia, odynophagia, melena, hematochezia, dysuria, hematuria, rash, seizure activity, orthopnea, PND, pedal edema, claudication. Remaining systems are negative.  Physical Exam: Well-developed well-nourished in no acute distress.  Skin is warm and dry.  HEENT is normal.  Neck is supple.  Chest is clear to auscultation with normal expansion.  Cardiovascular exam is regular rate and rhythm.  Abdominal exam nontender or distended. No masses palpated. Extremities show no edema. neuro grossly intact  ECG-normal sinus rhythm at a rate of 73, left bundle branch block.  Personally reviewed  A/P  1.  Nonischemic cardiomyopathy-LV function has improved on most recent echocardiogram.   Continue present medications including beta-blocker and ARB.  She has declined Entresto previously.  2 history of nonobstructive coronary disease-continue aspirin and statin.  3 hypertension-blood pressure controlled.  Continue present medications and follow.  4 hyperlipidemia-continue statin.  5 chronic combined systolic/diastolic congestive heart failure-volume status appears to be reasonable at present.  Continue diuretic at present dose.  Check potassium and renal function.  6 history of dyspnea-felt to be multifactorial including previous lung resection secondary to lung cancer, COPD, obstructive sleep apnea and obesity hypoventilation syndrome.  As noted in previous visits filling pressures at time of previous catheterization were normal.  Kirk Ruths, MD

## 2020-02-15 DIAGNOSIS — R682 Dry mouth, unspecified: Secondary | ICD-10-CM | POA: Diagnosis not present

## 2020-02-15 DIAGNOSIS — E669 Obesity, unspecified: Secondary | ICD-10-CM | POA: Diagnosis not present

## 2020-02-15 DIAGNOSIS — Z6830 Body mass index (BMI) 30.0-30.9, adult: Secondary | ICD-10-CM | POA: Diagnosis not present

## 2020-02-15 DIAGNOSIS — M06322 Rheumatoid nodule, left elbow: Secondary | ICD-10-CM | POA: Diagnosis not present

## 2020-02-15 DIAGNOSIS — M0579 Rheumatoid arthritis with rheumatoid factor of multiple sites without organ or systems involvement: Secondary | ICD-10-CM | POA: Diagnosis not present

## 2020-02-15 DIAGNOSIS — M0589 Other rheumatoid arthritis with rheumatoid factor of multiple sites: Secondary | ICD-10-CM | POA: Diagnosis not present

## 2020-02-15 DIAGNOSIS — R232 Flushing: Secondary | ICD-10-CM | POA: Diagnosis not present

## 2020-02-15 DIAGNOSIS — Z79899 Other long term (current) drug therapy: Secondary | ICD-10-CM | POA: Diagnosis not present

## 2020-02-15 DIAGNOSIS — M150 Primary generalized (osteo)arthritis: Secondary | ICD-10-CM | POA: Diagnosis not present

## 2020-02-15 DIAGNOSIS — M810 Age-related osteoporosis without current pathological fracture: Secondary | ICD-10-CM | POA: Diagnosis not present

## 2020-02-15 DIAGNOSIS — E785 Hyperlipidemia, unspecified: Secondary | ICD-10-CM | POA: Diagnosis not present

## 2020-02-21 ENCOUNTER — Ambulatory Visit (INDEPENDENT_AMBULATORY_CARE_PROVIDER_SITE_OTHER): Payer: HMO | Admitting: Cardiology

## 2020-02-21 ENCOUNTER — Encounter: Payer: Self-pay | Admitting: Cardiology

## 2020-02-21 ENCOUNTER — Other Ambulatory Visit: Payer: Self-pay

## 2020-02-21 VITALS — BP 128/72 | HR 73 | Ht 62.0 in | Wt 174.0 lb

## 2020-02-21 DIAGNOSIS — E7800 Pure hypercholesterolemia, unspecified: Secondary | ICD-10-CM

## 2020-02-21 DIAGNOSIS — I428 Other cardiomyopathies: Secondary | ICD-10-CM

## 2020-02-21 DIAGNOSIS — I2510 Atherosclerotic heart disease of native coronary artery without angina pectoris: Secondary | ICD-10-CM

## 2020-02-21 DIAGNOSIS — I10 Essential (primary) hypertension: Secondary | ICD-10-CM

## 2020-02-21 NOTE — Patient Instructions (Signed)

## 2020-02-22 ENCOUNTER — Other Ambulatory Visit: Payer: Self-pay | Admitting: *Deleted

## 2020-02-22 DIAGNOSIS — I5043 Acute on chronic combined systolic (congestive) and diastolic (congestive) heart failure: Secondary | ICD-10-CM

## 2020-02-22 LAB — BASIC METABOLIC PANEL
BUN/Creatinine Ratio: 38 — ABNORMAL HIGH (ref 12–28)
BUN: 33 mg/dL — ABNORMAL HIGH (ref 8–27)
CO2: 25 mmol/L (ref 20–29)
Calcium: 9.2 mg/dL (ref 8.7–10.3)
Chloride: 104 mmol/L (ref 96–106)
Creatinine, Ser: 0.88 mg/dL (ref 0.57–1.00)
GFR calc Af Amer: 72 mL/min/{1.73_m2} (ref >59)
GFR calc non Af Amer: 63 mL/min/{1.73_m2} (ref >59)
Glucose: 100 mg/dL — ABNORMAL HIGH (ref 65–99)
Potassium: 5.3 mmol/L — ABNORMAL HIGH (ref 3.5–5.2)
Sodium: 143 mmol/L (ref 134–144)

## 2020-03-06 DIAGNOSIS — Z961 Presence of intraocular lens: Secondary | ICD-10-CM | POA: Diagnosis not present

## 2020-03-06 DIAGNOSIS — H524 Presbyopia: Secondary | ICD-10-CM | POA: Diagnosis not present

## 2020-03-06 DIAGNOSIS — H52223 Regular astigmatism, bilateral: Secondary | ICD-10-CM | POA: Diagnosis not present

## 2020-03-16 DIAGNOSIS — I5043 Acute on chronic combined systolic (congestive) and diastolic (congestive) heart failure: Secondary | ICD-10-CM | POA: Diagnosis not present

## 2020-03-17 LAB — BASIC METABOLIC PANEL
BUN/Creatinine Ratio: 23 (ref 12–28)
BUN: 20 mg/dL (ref 8–27)
CO2: 21 mmol/L (ref 20–29)
Calcium: 9 mg/dL (ref 8.7–10.3)
Chloride: 103 mmol/L (ref 96–106)
Creatinine, Ser: 0.86 mg/dL (ref 0.57–1.00)
Glucose: 101 mg/dL — ABNORMAL HIGH (ref 65–99)
Potassium: 4.3 mmol/L (ref 3.5–5.2)
Sodium: 139 mmol/L (ref 134–144)
eGFR: 69 mL/min/{1.73_m2} (ref >59)

## 2020-04-02 DIAGNOSIS — Z1231 Encounter for screening mammogram for malignant neoplasm of breast: Secondary | ICD-10-CM | POA: Diagnosis not present

## 2020-04-02 DIAGNOSIS — Z803 Family history of malignant neoplasm of breast: Secondary | ICD-10-CM | POA: Diagnosis not present

## 2020-04-10 DIAGNOSIS — E559 Vitamin D deficiency, unspecified: Secondary | ICD-10-CM | POA: Diagnosis not present

## 2020-04-10 DIAGNOSIS — I10 Essential (primary) hypertension: Secondary | ICD-10-CM | POA: Diagnosis not present

## 2020-04-10 DIAGNOSIS — M810 Age-related osteoporosis without current pathological fracture: Secondary | ICD-10-CM | POA: Diagnosis not present

## 2020-04-10 DIAGNOSIS — E7800 Pure hypercholesterolemia, unspecified: Secondary | ICD-10-CM | POA: Diagnosis not present

## 2020-04-10 DIAGNOSIS — D649 Anemia, unspecified: Secondary | ICD-10-CM | POA: Diagnosis not present

## 2020-04-11 ENCOUNTER — Ambulatory Visit: Payer: HMO | Attending: Internal Medicine

## 2020-04-11 DIAGNOSIS — Z23 Encounter for immunization: Secondary | ICD-10-CM

## 2020-04-11 NOTE — Progress Notes (Signed)
   Covid-19 Vaccination Clinic  Name:  Taylor Berry    MRN: [id number] DOB: 1940-09-10  04/11/2020  [name] was observed post Covid-19 immunization for 15 minutes without incident. She was provided with Vaccine Information Sheet and instruction to access the V-Safe system.   [name] was instructed to call 911 with any severe reactions post vaccine: Marland Kitchen Difficulty breathing  . Swelling of face and throat  . A fast heartbeat  . A bad rash all over body  . Dizziness and weakness   Immunizations Administered    Name Date Dose VIS Date Route   PFIZER Comrnaty(Gray TOP) Covid-19 Vaccine 04/11/2020 10:22 AM 0.3 mL 12/15/2019 Intramuscular   Manufacturer: [employer], [employer]   Lot: [id number]   [place]: [phone]

## 2020-04-23 ENCOUNTER — Other Ambulatory Visit (HOSPITAL_COMMUNITY): Payer: Self-pay

## 2020-04-23 MED ORDER — COVID-19 MRNA VAC-TRIS(PFIZER) 30 MCG/0.3ML IM SUSP
INTRAMUSCULAR | 0 refills | Status: DC
  Filled 2020-04-23: qty 0.3, 17d supply, fill #0

## 2020-04-25 ENCOUNTER — Other Ambulatory Visit (HOSPITAL_COMMUNITY): Payer: Self-pay

## 2020-05-01 ENCOUNTER — Other Ambulatory Visit: Payer: Self-pay | Admitting: Allergy and Immunology

## 2020-05-01 ENCOUNTER — Encounter: Payer: Self-pay | Admitting: Allergy and Immunology

## 2020-05-01 ENCOUNTER — Other Ambulatory Visit: Payer: Self-pay

## 2020-05-01 ENCOUNTER — Ambulatory Visit: Payer: HMO | Admitting: Allergy and Immunology

## 2020-05-01 VITALS — BP 140/62 | HR 65 | Temp 98.0°F | Resp 12 | Ht 62.0 in | Wt 171.8 lb

## 2020-05-01 DIAGNOSIS — J449 Chronic obstructive pulmonary disease, unspecified: Secondary | ICD-10-CM | POA: Diagnosis not present

## 2020-05-01 DIAGNOSIS — R682 Dry mouth, unspecified: Secondary | ICD-10-CM | POA: Diagnosis not present

## 2020-05-01 DIAGNOSIS — R197 Diarrhea, unspecified: Secondary | ICD-10-CM | POA: Diagnosis not present

## 2020-05-01 DIAGNOSIS — J3089 Other allergic rhinitis: Secondary | ICD-10-CM

## 2020-05-01 DIAGNOSIS — K219 Gastro-esophageal reflux disease without esophagitis: Secondary | ICD-10-CM | POA: Diagnosis not present

## 2020-05-01 DIAGNOSIS — J4489 Other specified chronic obstructive pulmonary disease: Secondary | ICD-10-CM

## 2020-05-01 MED ORDER — TRELEGY ELLIPTA 100-62.5-25 MCG/INH IN AEPB: 1.0000 | INHALATION_SPRAY | Freq: Every day | RESPIRATORY_TRACT | 5 refills | Status: DC

## 2020-05-01 MED ORDER — LANSOPRAZOLE 30 MG PO CPDR: 30.0000 mg | DELAYED_RELEASE_CAPSULE | Freq: Every morning | ORAL | 5 refills | Status: DC

## 2020-05-01 MED ORDER — ALBUTEROL SULFATE HFA 108 (90 BASE) MCG/ACT IN AERS: INHALATION_SPRAY | RESPIRATORY_TRACT | 1 refills | Status: DC

## 2020-05-01 MED ORDER — FAMOTIDINE 40 MG PO TABS: ORAL_TABLET | ORAL | 5 refills | Status: DC

## 2020-05-01 MED ORDER — COLESTIPOL HCL 1 G PO TABS
ORAL_TABLET | ORAL | 5 refills | Status: DC
Start: 2020-05-01 — End: 2020-12-11

## 2020-05-01 NOTE — Progress Notes (Signed)
[place]   Follow-up Note  Referring Provider: Kelton Pillar, MD Primary Provider: Kelton Pillar, MD Date of Office Visit: 05/01/2020  Subjective:   Taylor Berry (DOB: 1940/11/20) is a 80 y.o. female who returns to the [facility] on 05/01/2020 in re-evaluation of the following:  HPI: Yahaira presents to this clinic in evaluation of COPD with asthma, allergic rhinitis, LPR, and colestipol responsive diarrhea.  I last saw her in his clinic on 29 November 2019.  Her breathing is going relatively well and she rarely uses a short acting bronchodilator and she can exert herself to the extent that she desires given her compensated congestive heart failure and rheumatoid arthritis.  She has not required a systemic steroid or antibiotic to treat any type of airway issue.  She is currently using a triple inhaler and a nasal steroid.  Her reflux has been under pretty good control.  She continues on a proton pump inhibitor and a H2 receptor blocker.  Her diarrhea has become more of an issue even though she uses colestipol every day at 1 g/day.  She is scheduled for a colonoscopy this week.  She has developed very dry mouth which has been a slow progressive problem over a long period in time.  She questions whether or not Prevacid is contributing to this dry mouth.  She does have rheumatoid arthritis and is being treated with Areva.  She is using a triple inhaler with an anticholinergic agent.  Allergies as of 05/01/2020      Reactions   Latex    REACTION: itching   Ace Inhibitors    REACTION: cough   Fentanyl    Hallucination "I go crazy"    Hydrochlorothiazide    REACTION: decreased sodium   Penicillins Other (See Comments)   REACTION: intolerant due to yeast infection Has patient had a PCN reaction causing immediate rash, facial/tongue/throat swelling, SOB or lightheadedness with hypotension:No Has patient had a PCN  reaction causing severe rash involving mucus membranes or skin necrosis: No Has patient had a PCN reaction that required hospitalization No Has patient had a PCN reaction occurring within the last 10 years: Yes If all of the above answers are "NO", then may proceed with Cephalosporin use.      Medication List      albuterol 108 (90 Base) MCG/ACT inhaler Commonly known as: VENTOLIN HFA INHALE 2 PUFFS BY MOUTH IN EACH NOSTRIL EVERY 4 TO 6 HOURS AS NEEDED   aspirin 81 MG EC tablet Take 81 mg by mouth daily.   aspirin 81 MG chewable tablet 1 tablet   atorvastatin 40 MG tablet Commonly known as: LIPITOR 1 tablet   atorvastatin 40 MG tablet Commonly known as: LIPITOR Take 1 tablet (40 mg total) by mouth daily.   atorvastatin 20 MG tablet Commonly known as: LIPITOR Take 1 tablet by mouth daily.   candesartan 4 MG tablet Commonly known as: ATACAND 1 tablet   candesartan 8 MG tablet Commonly known as: ATACAND TAKE 1 TABLET(8 MG) BY MOUTH DAILY   chlorhexidine 0.12 % solution Commonly known as: PERIDEX SMARTSIG:By Mouth   colestipol 1 g tablet Commonly known as: COLESTID 1 tablet   denosumab 60 MG/ML Sosy injection Commonly known as: PROLIA Inject 60 mg into the skin every 6 (six) months.   famotidine 40 MG tablet Commonly known as: PEPCID 1 tablet at bedtime.   famotidine 40 MG tablet Commonly known as: PEPCID TAKE  1 TABLET(40 MG) BY MOUTH EVERY EVENING   fluticasone 50 MCG/ACT nasal spray Commonly known as: FLONASE 1 spray in each nostril   fluticasone 50 MCG/ACT nasal spray Commonly known as: FLONASE Use 1-2 sprays per nostril daily   furosemide 20 MG tablet Commonly known as: LASIX Take as needed for >3lbs weight gain in one day or >5lbs weight gain in one week; or for swelling   lansoprazole 30 MG capsule Commonly known as: PREVACID TAKE 1 CAPSULE BY MOUTH DAILY IN THE MORNING   leflunomide 20 MG tablet Commonly known as: ARAVA Take 20 mg by  mouth daily.   metoprolol succinate 25 MG 24 hr tablet Commonly known as: TOPROL-XL Take 1 tablet (25 mg total) by mouth daily.   Pfizer-BioNT COVID-19 Vac-TriS Susp injection Generic drug: COVID-19 mRNA Vac-TriS (Pfizer) Inject into the muscle.   predniSONE 1 MG tablet Commonly known as: DELTASONE Take 1 mg by mouth daily with breakfast.   Trelegy Ellipta 100-62.5-25 MCG/INH Aepb Generic drug: Fluticasone-Umeclidin-Vilant 1 puff   Trelegy Ellipta 100-62.5-25 MCG/INH Aepb Generic drug: Fluticasone-Umeclidin-Vilant Inhale 1 puff into the lungs daily.   venlafaxine XR 75 MG 24 hr capsule Commonly known as: EFFEXOR-XR Take 75 mg by mouth daily.       Past Medical History:  Diagnosis Date  . Abnormal CT scan   . Allergic rhinitis   . Anemia 2015   after lung surgery  . Angio-edema   . Complication of anesthesia    Fentanyl allergy, "claustrophobia"  . COPD (chronic obstructive pulmonary disease) ([place])   . Depression   . Diaphoresis 07/30/2015   Excessive sweating with little exertion  . Difficult intravenous access   . GERD (gastroesophageal reflux disease)    In the past  . Hyperlipidemia   . Hypertension   . Left bundle branch block   . Lung cancer ([place]) 2015   left upper lobe wedge removed-no further tx.  . Neuropathy    Tingling in arms, fingers, and feet (Since 06/15/16)  . Obesity   . OSA (obstructive sleep apnea)    denies.  . Osteoarthritis    ra also  . Osteoporosis   . Recurrent upper respiratory infection (URI)   . Rheumatoid arthritis(714.0)    Dr. Stark Jock follows  . SOB (shortness of breath) on exertion   . Uterine cancer ([place]) 1971   tx surgical    Past Surgical History:  Procedure Laterality Date  . ABDOMINAL HYSTERECTOMY     in situ carcinoma partial  . CARDIAC CATHETERIZATION  01/27/11   minor non-obs CAD, NL EF, mild pulm HTN  . CATARACT EXTRACTION, BILATERAL     last done 12'16-  . COLONOSCOPY W/ BIOPSIES AND POLYPECTOMY    .  COLONOSCOPY WITH PROPOFOL N/A 02/13/2015   Procedure: COLONOSCOPY WITH PROPOFOL;  Surgeon: Laurence Spates, MD;  [facility];  Service: Endoscopy;  Laterality: N/A;  . ESOPHAGOGASTRODUODENOSCOPY (EGD) WITH PROPOFOL N/A 09/30/2016   Procedure: ESOPHAGOGASTRODUODENOSCOPY (EGD) WITH PROPOFOL;  Surgeon: Laurence Spates, MD;  [facility];  Service: Endoscopy;  Laterality: N/A;  . LYMPH NODE DISSECTION Left 04/12/2013   Procedure: LYMPH NODE DISSECTION;  Surgeon: [name], MD;  Location: [facility];  Service: Thoracic;  Laterality: Left;  . RIGHT/LEFT HEART CATH AND CORONARY ANGIOGRAPHY N/A 10/08/2016   Procedure: RIGHT/LEFT HEART CATH AND CORONARY ANGIOGRAPHY;  Surgeon: Burnell Blanks, MD;  Location: [place] [facility];  Service: Cardiovascular;  Laterality: N/A;  . TONSILLECTOMY    . VESICOVAGINAL FISTULA  CLOSURE W/ TAH  1971  . VIDEO ASSISTED THORACOSCOPY (VATS)/THOROCOTOMY Left 04/12/2013   Procedure: VIDEO ASSISTED THORACOSCOPY (VATS)/THOROCOTOMY, WITH LEFT UPPER LOBE WEDGE RESECTION, CHEST WALL BIOPSY ;  Surgeon: [name], MD;  Location: [facility];  Service: Thoracic;  Laterality: Left;  Marland Kitchen VIDEO BRONCHOSCOPY N/A 04/12/2013   Procedure: VIDEO BRONCHOSCOPY;  Surgeon: [name], MD;  Location: [facility];  Service: Thoracic;  Laterality: N/A;    Review of systems negative except as noted in HPI / PMHx or noted below:  Review of Systems  Constitutional: Negative.   HENT: Negative.   Eyes: Negative.   Respiratory: Negative.   Cardiovascular: Negative.   Gastrointestinal: Negative.   Genitourinary: Negative.   Musculoskeletal: Negative.   Skin: Negative.   Neurological: Negative.   Endo/Heme/Allergies: Negative.   Psychiatric/Behavioral: Negative.      Objective:   Vitals:   05/01/20 1149  BP: 140/62  Pulse: 65  Resp: 12  Temp: 98 F (36.7 C)  SpO2: 95%   Height: 5\' 2"  (157.5 cm)  Weight: 171 lb 12.8 oz (77.9 kg)   Physical Exam Constitutional:       Appearance: She is not diaphoretic.  HENT:     Head: Normocephalic.     Right Ear: Tympanic membrane, ear canal and external ear normal.     Left Ear: Tympanic membrane, ear canal and external ear normal.     Nose: Nose normal. No mucosal edema or rhinorrhea.     Mouth/Throat:     Pharynx: Uvula midline. No oropharyngeal exudate.  Eyes:     Conjunctiva/sclera: Conjunctivae normal.  Neck:     Thyroid: No thyromegaly.     Trachea: Trachea normal. No tracheal tenderness or tracheal deviation.  Cardiovascular:     Rate and Rhythm: Normal rate and regular rhythm.     Heart sounds: S1 normal and S2 normal. Murmur (Systolic) heard.    Pulmonary:     Effort: No respiratory distress.     Breath sounds: Normal breath sounds. No stridor. No wheezing (Inspiratory crackles posterior left base greater than right base) or rales.  Lymphadenopathy:     Head:     Right side of head: No tonsillar adenopathy.     Left side of head: No tonsillar adenopathy.     Cervical: No cervical adenopathy.  Skin:    Findings: No erythema or rash.     Nails: There is no clubbing.  Neurological:     Mental Status: She is alert.     Diagnostics:    Spirometry was performed and demonstrated an FEV1 of 0.93 at 51 % of predicted.  Assessment and Plan:   1. COPD with asthma ([place])   2. Perennial allergic rhinitis   3. LPRD (laryngopharyngeal reflux disease)   4. Diarrhea, unspecified type   5. Dry mouth     1.  Continue to treat reflux and LPR with the following combination:   A. Famotidine 40 mg 1 time per day in PM  B. Can attempt to stop Prevacid 30mg  1 time per day in AM  2.  Continue flonase -1-2 sprays each nostril 1 time per day  3.  Continue Trelegy 100 - 1 inhalation 1 time per day. Use Biotene before inhaling.  Rinse mouth after inhaling.  Then reuse Biotene after rinsing.  4.  If needed:   A. Pro-air HFA 2 inhalations every 4-6 hours.  B. Increase Colestipol 1 g tablet 1-2 tablets  1-2 times a day (4 tablets maximum)  5. Check blood - ANA w/R for dry mouth  6.  Return to clinic in 6 months or earlier if problem   There are several issues that we need to work through regarding Jaquetta's health status.  First, she has very dry mouth and this is probably secondary to the anticholinergic agent found in her triple inhaler.  We will try to get her to use Biotene in the manner described above to see if this does help with that potential side effect.  She questions whether or not her dry mouth is secondary to Prevacid and she is certainly welcome to stop Prevacid to see if this results in any improvement regarding this issue.  As well, she does have rheumatoid arthritis and we will check an ANA to see if she has developed sicca syndrome/Sjogren's.  And she can increase her colestipol to see if this does result in improvement regarding her chronic diarrhea.  She will be having a colonoscopy this week which hopefully will be normal.  She will keep in contact with me by telephone noting her response to the approach noted above.  Allena Katz, MD Allergy / Immunology [place]

## 2020-05-01 NOTE — Patient Instructions (Addendum)
  1.  Continue to treat reflux and LPR with the following combination:   A. Famotidine 40mg  1 time per day in PM  B. Can attempt to stop Prevacid 30mg  1 time per day in AM  2.  Continue flonase -1-2 sprays each nostril 1 time per day  3.  Continue Trelegy 100 - 1 inhalation 1 time per day. Use Biotene before inhaling.  Rinse mouth after inhaling.  Then reuse Biotene after rinsing.  4.  If needed:   A. Pro-air HFA 2 inhalations every 4-6 hours.  B. Increase Colestipol 1 g tablet 1-2 tablets 1-2 times a day (4 tablets maximum)   5. Check blood - ANA w/R for dry mouth  6.  Return to clinic in 6 months or earlier if problem

## 2020-05-02 ENCOUNTER — Encounter: Payer: Self-pay | Admitting: Allergy and Immunology

## 2020-05-02 DIAGNOSIS — D649 Anemia, unspecified: Secondary | ICD-10-CM | POA: Diagnosis not present

## 2020-05-02 LAB — ANA W/REFLEX: Anti Nuclear Antibody (ANA): NEGATIVE

## 2020-05-15 DIAGNOSIS — M810 Age-related osteoporosis without current pathological fracture: Secondary | ICD-10-CM | POA: Diagnosis not present

## 2020-05-15 DIAGNOSIS — M150 Primary generalized (osteo)arthritis: Secondary | ICD-10-CM | POA: Diagnosis not present

## 2020-05-15 DIAGNOSIS — E663 Overweight: Secondary | ICD-10-CM | POA: Diagnosis not present

## 2020-05-15 DIAGNOSIS — R29898 Other symptoms and signs involving the musculoskeletal system: Secondary | ICD-10-CM | POA: Diagnosis not present

## 2020-05-15 DIAGNOSIS — R0602 Shortness of breath: Secondary | ICD-10-CM | POA: Diagnosis not present

## 2020-05-15 DIAGNOSIS — E785 Hyperlipidemia, unspecified: Secondary | ICD-10-CM | POA: Diagnosis not present

## 2020-05-15 DIAGNOSIS — Z6829 Body mass index (BMI) 29.0-29.9, adult: Secondary | ICD-10-CM | POA: Diagnosis not present

## 2020-05-15 DIAGNOSIS — R682 Dry mouth, unspecified: Secondary | ICD-10-CM | POA: Diagnosis not present

## 2020-05-15 DIAGNOSIS — M0589 Other rheumatoid arthritis with rheumatoid factor of multiple sites: Secondary | ICD-10-CM | POA: Diagnosis not present

## 2020-05-15 DIAGNOSIS — C3490 Malignant neoplasm of unspecified part of unspecified bronchus or lung: Secondary | ICD-10-CM | POA: Diagnosis not present

## 2020-05-15 DIAGNOSIS — M06322 Rheumatoid nodule, left elbow: Secondary | ICD-10-CM | POA: Diagnosis not present

## 2020-05-15 DIAGNOSIS — Z79899 Other long term (current) drug therapy: Secondary | ICD-10-CM | POA: Diagnosis not present

## 2020-05-22 ENCOUNTER — Other Ambulatory Visit: Payer: Self-pay | Admitting: Allergy and Immunology

## 2020-05-29 ENCOUNTER — Ambulatory Visit: Payer: HMO | Admitting: Allergy and Immunology

## 2020-06-14 DIAGNOSIS — R197 Diarrhea, unspecified: Secondary | ICD-10-CM | POA: Diagnosis not present

## 2020-06-14 DIAGNOSIS — R159 Full incontinence of feces: Secondary | ICD-10-CM | POA: Diagnosis not present

## 2020-06-18 ENCOUNTER — Telehealth: Payer: Self-pay

## 2020-06-18 NOTE — Telephone Encounter (Signed)
Patient called for 2 samples of Trelegy Ellipta 100-62.5-25 MCG inhalers. I signed them out and placed them in the basket up front for patient pickup.

## 2020-06-29 DIAGNOSIS — D1801 Hemangioma of skin and subcutaneous tissue: Secondary | ICD-10-CM | POA: Diagnosis not present

## 2020-06-29 DIAGNOSIS — D225 Melanocytic nevi of trunk: Secondary | ICD-10-CM | POA: Diagnosis not present

## 2020-06-29 DIAGNOSIS — Z85828 Personal history of other malignant neoplasm of skin: Secondary | ICD-10-CM | POA: Diagnosis not present

## 2020-06-29 DIAGNOSIS — L821 Other seborrheic keratosis: Secondary | ICD-10-CM | POA: Diagnosis not present

## 2020-07-03 DIAGNOSIS — M71321 Other bursal cyst, right elbow: Secondary | ICD-10-CM | POA: Diagnosis not present

## 2020-07-03 DIAGNOSIS — M20011 Mallet finger of right finger(s): Secondary | ICD-10-CM | POA: Diagnosis not present

## 2020-07-18 DIAGNOSIS — M71321 Other bursal cyst, right elbow: Secondary | ICD-10-CM | POA: Diagnosis not present

## 2020-07-18 DIAGNOSIS — I96 Gangrene, not elsewhere classified: Secondary | ICD-10-CM | POA: Diagnosis not present

## 2020-07-18 DIAGNOSIS — D2111 Benign neoplasm of connective and other soft tissue of right upper limb, including shoulder: Secondary | ICD-10-CM | POA: Diagnosis not present

## 2020-07-18 DIAGNOSIS — M25421 Effusion, right elbow: Secondary | ICD-10-CM | POA: Diagnosis not present

## 2020-07-18 DIAGNOSIS — M06321 Rheumatoid nodule, right elbow: Secondary | ICD-10-CM | POA: Diagnosis not present

## 2020-07-18 DIAGNOSIS — M20011 Mallet finger of right finger(s): Secondary | ICD-10-CM | POA: Diagnosis not present

## 2020-07-18 DIAGNOSIS — M0639 Rheumatoid nodule, multiple sites: Secondary | ICD-10-CM | POA: Diagnosis not present

## 2020-07-18 DIAGNOSIS — L988 Other specified disorders of the skin and subcutaneous tissue: Secondary | ICD-10-CM | POA: Diagnosis not present

## 2020-07-31 DIAGNOSIS — M20011 Mallet finger of right finger(s): Secondary | ICD-10-CM | POA: Diagnosis not present

## 2020-07-31 DIAGNOSIS — Z9889 Other specified postprocedural states: Secondary | ICD-10-CM | POA: Diagnosis not present

## 2020-07-31 DIAGNOSIS — M71321 Other bursal cyst, right elbow: Secondary | ICD-10-CM | POA: Diagnosis not present

## 2020-08-10 DIAGNOSIS — M85852 Other specified disorders of bone density and structure, left thigh: Secondary | ICD-10-CM | POA: Diagnosis not present

## 2020-08-10 DIAGNOSIS — M85851 Other specified disorders of bone density and structure, right thigh: Secondary | ICD-10-CM | POA: Diagnosis not present

## 2020-08-10 DIAGNOSIS — Z780 Asymptomatic menopausal state: Secondary | ICD-10-CM | POA: Diagnosis not present

## 2020-08-27 DIAGNOSIS — M06322 Rheumatoid nodule, left elbow: Secondary | ICD-10-CM | POA: Diagnosis not present

## 2020-08-27 DIAGNOSIS — M0589 Other rheumatoid arthritis with rheumatoid factor of multiple sites: Secondary | ICD-10-CM | POA: Diagnosis not present

## 2020-08-27 DIAGNOSIS — Z6829 Body mass index (BMI) 29.0-29.9, adult: Secondary | ICD-10-CM | POA: Diagnosis not present

## 2020-08-27 DIAGNOSIS — M150 Primary generalized (osteo)arthritis: Secondary | ICD-10-CM | POA: Diagnosis not present

## 2020-08-27 DIAGNOSIS — E663 Overweight: Secondary | ICD-10-CM | POA: Diagnosis not present

## 2020-08-27 DIAGNOSIS — M810 Age-related osteoporosis without current pathological fracture: Secondary | ICD-10-CM | POA: Diagnosis not present

## 2020-08-27 DIAGNOSIS — R0602 Shortness of breath: Secondary | ICD-10-CM | POA: Diagnosis not present

## 2020-08-27 DIAGNOSIS — Z79899 Other long term (current) drug therapy: Secondary | ICD-10-CM | POA: Diagnosis not present

## 2020-08-27 DIAGNOSIS — R682 Dry mouth, unspecified: Secondary | ICD-10-CM | POA: Diagnosis not present

## 2020-08-30 DIAGNOSIS — Z981 Arthrodesis status: Secondary | ICD-10-CM | POA: Diagnosis not present

## 2020-08-30 DIAGNOSIS — M20011 Mallet finger of right finger(s): Secondary | ICD-10-CM | POA: Diagnosis not present

## 2020-08-31 ENCOUNTER — Ambulatory Visit: Payer: HMO | Admitting: Emergency Medicine

## 2020-08-31 ENCOUNTER — Encounter: Payer: Self-pay | Admitting: Emergency Medicine

## 2020-08-31 ENCOUNTER — Other Ambulatory Visit: Payer: Self-pay

## 2020-08-31 DIAGNOSIS — J449 Chronic obstructive pulmonary disease, unspecified: Secondary | ICD-10-CM

## 2020-08-31 DIAGNOSIS — R9389 Abnormal findings on diagnostic imaging of other specified body structures: Secondary | ICD-10-CM

## 2020-08-31 MED ORDER — ALBUTEROL SULFATE HFA 108 (90 BASE) MCG/ACT IN AERS: INHALATION_SPRAY | RESPIRATORY_TRACT | 0 refills | Status: DC

## 2020-08-31 MED ORDER — TRELEGY ELLIPTA 100-62.5-25 MCG/INH IN AEPB: 1.0000 | INHALATION_SPRAY | Freq: Every day | RESPIRATORY_TRACT | 0 refills | Status: DC

## 2020-08-31 NOTE — Patient Instructions (Signed)
We will order a CT scan of your chest to compare with your priors. Please continue Trelegy 1 inhalation once daily.  Rinse and gargle after using. Keep albuterol available to use 2 puffs up to every 4 hours if needed for shortness of breath, chest tightness, wheezing.  We will refill your albuterol today. Continue your other medications as ordered. Follow Dr. Lamonte Sakai next available after your CT scan so that we can review the results together.

## 2020-08-31 NOTE — Assessment & Plan Note (Signed)
-  Pulmonary nodule 1.8 cm that is been stable since January 2020.  It did enlarge some compared with more remote scans but has been stable since.  She is at risk for malignancy given her history of lung cancer, also consider rheumatoid nodule disease especially given the stability.  She is due for an annual CT chest now we will arrange for this.

## 2020-08-31 NOTE — Addendum Note (Signed)
Addended by: Gavin Potters R on: 08/31/2020 12:25 PM   Modules accepted: Orders

## 2020-08-31 NOTE — Progress Notes (Signed)
  HPI:  ROV 04/26/19 --this is a follow-up visit for Ms. Haycraft, 80 year old woman with a history of severe COPD, obesity with untreated OSA, rheumatoid arthritis, uterine cancer.  She had squamous cell lung cancer treated with a left upper lobe wedge resection. She also see Dr Stanford Breed for CHF.  We have been following nodular disease by CT chest with slow increase in size in a medial right lower lobe nodule since CT chest 04/17/2016.  The nodule was stable in size between 01/21/2018 and 08/12/2018, 1.8 cm in maximum dimension. Currently managed on Trelegy.  She has albuterol, uses almost never.  Remains on Pepcid and prevacid, has run out of her fluticasone nasal spray She has been doing fairly well, has noticed difficulty with hills and carrying objects. Has not needed her albuterol. She has noticed some increased foot and hand edema. Had her lasix increased 4/8, hasn't reallyu noticed her swelling or breathing changed. No flares since I've seen her, last prednisone was over a year ago for her RA.   ROV 08/31/20 --80 year old woman with a history of obesity, severe COPD, prior left upper lobe wedge resection for squamous cell lung cancer, untreated OSA.  Also with a history of CAD, uterine cancer, RA on Areva and hydroxychlorquine (Dr Amil Amen).  She has a medial right lower lobe pulmonary nodule that we have been following with serial imaging that had grown some going back to remote films.  Her most recent CT chest was 08/09/2019 at which time the nodule was stable going back to 01/2018. She has noticed some increased exertional SOB w exertion, especially with climbing hills. Her cough is well controlled. Rare to no albuterol use. Needs a new albuterol.  On trelegy, relying on samples. She is off PPI, on H2 blockade   EXAM:  Vitals:   08/31/20 1135  BP: 130/78  Pulse: 64  Temp: 98.1 F (36.7 C)  TempSrc: Oral  SpO2: 96%  Weight: 172 lb 3.2 oz (78.1 kg)  Height: 5\' 3"  (1.6 m)   Gen: Pleasant,  overweight, in no distress,  normal affect  ENT: No lesions,  mouth clear, somewhat dry, oropharynx clear, no postnasal drip  Neck: No JVD, no stridor  Lungs: No use of accessory muscles, clear bilaterally, no wheeze.   Cardiovascular: RRR, heart sounds normal, no murmur or gallops, no peripheral edema  Musculoskeletal: No deformities, no cyanosis or clubbing  Neuro: alert, non focal  Skin: Warm, no lesions or rashes     Abnormal CT of the chest -Pulmonary nodule 1.8 cm that is been stable since January 2020.  It did enlarge some compared with more remote scans but has been stable since.  She is at risk for malignancy given her history of lung cancer, also consider rheumatoid nodule disease especially given the stability.  She is due for an annual CT chest now we will arrange for this.  COPD (chronic obstructive pulmonary disease) ([place]) Benefiting from and tolerating Trelegy.  She has been dependent on samples and we will try to give her some today.  She needs an albuterol refill and we will order this as well.  I did encourage her to try using the albuterol little more liberally especially when she gets exertional dyspnea climbing hills, etc.  Baltazar Apo, MD, PhD 08/31/2020, 11:54 AM [facility] [phone] or if no answer [phone]

## 2020-08-31 NOTE — Assessment & Plan Note (Signed)
Benefiting from and tolerating Trelegy.  She has been dependent on samples and we will try to give her some today.  She needs an albuterol refill and we will order this as well.  I did encourage her to try using the albuterol little more liberally especially when she gets exertional dyspnea climbing hills, etc.

## 2020-09-04 ENCOUNTER — Other Ambulatory Visit: Payer: Self-pay | Admitting: Cardiology

## 2020-09-04 DIAGNOSIS — E7800 Pure hypercholesterolemia, unspecified: Secondary | ICD-10-CM

## 2020-09-07 ENCOUNTER — Other Ambulatory Visit: Payer: Self-pay

## 2020-09-07 ENCOUNTER — Ambulatory Visit (INDEPENDENT_AMBULATORY_CARE_PROVIDER_SITE_OTHER)
Admission: RE | Admit: 2020-09-07 | Discharge: 2020-09-07 | Disposition: A | Discharge status: Home / Self Care | Payer: HMO | Source: Ambulatory Visit | Attending: Emergency Medicine | Admitting: Emergency Medicine

## 2020-09-07 DIAGNOSIS — J479 Bronchiectasis, uncomplicated: Secondary | ICD-10-CM | POA: Diagnosis not present

## 2020-09-07 DIAGNOSIS — I700 Atherosclerosis of aorta: Secondary | ICD-10-CM | POA: Diagnosis not present

## 2020-09-07 DIAGNOSIS — R911 Solitary pulmonary nodule: Secondary | ICD-10-CM | POA: Diagnosis not present

## 2020-09-07 DIAGNOSIS — R9389 Abnormal findings on diagnostic imaging of other specified body structures: Secondary | ICD-10-CM | POA: Diagnosis not present

## 2020-09-11 ENCOUNTER — Telehealth: Payer: Self-pay | Admitting: Emergency Medicine

## 2020-09-11 DIAGNOSIS — K7689 Other specified diseases of liver: Secondary | ICD-10-CM

## 2020-09-11 NOTE — Telephone Encounter (Signed)
Called patient to review her CT scan of the chest.  Stable nodular disease and bronchiectasis with a right lower lobe area of rounded atelectasis.  There was some comment made about a low-density right hepatic lobe lesion that was indeterminate.  An MRI of the abdomen or CT scan of the abdomen with contrast was recommended.  I wanted to discuss this with her.  She did not answer so I will have to try her again.

## 2020-09-12 NOTE — Telephone Encounter (Signed)
Pt returning missed call. [phone]

## 2020-09-13 NOTE — Telephone Encounter (Signed)
Pt calling back again [phone] she expects a call

## 2020-09-13 NOTE — Telephone Encounter (Signed)
I reviewed the CT scan with the patient.  She has a hepatic hypodensity of unclear significance.  She does not think she can tolerate an MRI of the abdomen but she is willing to get a CT scan with contrast.  I will order this now.  Depending on the results we will decide if it needs any further work-up.

## 2020-09-13 NOTE — Telephone Encounter (Signed)
I called and spoke with patient regarding CT and Dr. Lamonte Sakai recs. She would like to talk to Dr. Lamonte Sakai specifically regarding all of it. Stated she has been trying for 2 days and hasnt heard anything and is worried. I stated I will send message to dr. Lamonte Sakai.  Dr. Lamonte Sakai, please advise. Thanks!

## 2020-09-14 ENCOUNTER — Other Ambulatory Visit: Payer: Self-pay

## 2020-09-14 DIAGNOSIS — R9389 Abnormal findings on diagnostic imaging of other specified body structures: Secondary | ICD-10-CM

## 2020-09-17 ENCOUNTER — Other Ambulatory Visit (INDEPENDENT_AMBULATORY_CARE_PROVIDER_SITE_OTHER): Payer: HMO

## 2020-09-17 DIAGNOSIS — R9389 Abnormal findings on diagnostic imaging of other specified body structures: Secondary | ICD-10-CM | POA: Diagnosis not present

## 2020-09-17 LAB — BASIC METABOLIC PANEL
BUN: 20 mg/dL (ref 6–23)
CO2: 26 mEq/L (ref 19–32)
Calcium: 9.7 mg/dL (ref 8.4–10.5)
Chloride: 103 mEq/L (ref 96–112)
Creatinine, Ser: 0.92 mg/dL (ref 0.40–1.20)
GFR: 59.04 mL/min — ABNORMAL LOW (ref >60.00)
Glucose, Bld: 94 mg/dL (ref 70–99)
Potassium: 4.4 mEq/L (ref 3.5–5.1)
Sodium: 136 mEq/L (ref 135–145)

## 2020-09-21 ENCOUNTER — Other Ambulatory Visit: Payer: HMO

## 2020-09-25 ENCOUNTER — Other Ambulatory Visit: Payer: HMO

## 2020-09-27 ENCOUNTER — Telehealth: Payer: Self-pay

## 2020-09-27 NOTE — Telephone Encounter (Signed)
Please advise 

## 2020-09-27 NOTE — Telephone Encounter (Signed)
Patient picked up samples

## 2020-09-27 NOTE — Telephone Encounter (Signed)
2 trelegy 100 samples have been placed upfront in the sample basket. Patient plans to pick them up later this afternoon.

## 2020-09-27 NOTE — Telephone Encounter (Signed)
Patient called to request samples of Trelegy 100.  Please Advise

## 2020-09-28 ENCOUNTER — Other Ambulatory Visit: Payer: Self-pay | Admitting: Emergency Medicine

## 2020-09-28 ENCOUNTER — Ambulatory Visit (INDEPENDENT_AMBULATORY_CARE_PROVIDER_SITE_OTHER)
Admission: RE | Admit: 2020-09-28 | Discharge: 2020-09-28 | Disposition: A | Discharge status: Home / Self Care | Payer: HMO | Source: Ambulatory Visit | Attending: Emergency Medicine | Admitting: Emergency Medicine

## 2020-09-28 ENCOUNTER — Telehealth: Payer: Self-pay

## 2020-09-28 ENCOUNTER — Other Ambulatory Visit: Payer: Self-pay

## 2020-09-28 DIAGNOSIS — K7689 Other specified diseases of liver: Secondary | ICD-10-CM

## 2020-09-28 DIAGNOSIS — K429 Umbilical hernia without obstruction or gangrene: Secondary | ICD-10-CM | POA: Diagnosis not present

## 2020-09-28 DIAGNOSIS — K8020 Calculus of gallbladder without cholecystitis without obstruction: Secondary | ICD-10-CM | POA: Diagnosis not present

## 2020-09-28 DIAGNOSIS — Z85118 Personal history of other malignant neoplasm of bronchus and lung: Secondary | ICD-10-CM | POA: Diagnosis not present

## 2020-09-28 MED ORDER — IOHEXOL 350 MG/ML SOLN
80.0000 mL | Freq: Once | INTRAVENOUS | Status: AC | PRN
  Administered 2020-09-28: 80 mL via INTRAVENOUS

## 2020-09-28 NOTE — Telephone Encounter (Signed)
Spoke with Dr. Lamonte Sakai who stated abd/pelvis CT is ok to be with/without. Notified Torin who stated understanding.

## 2020-10-04 ENCOUNTER — Telehealth: Payer: Self-pay | Admitting: Emergency Medicine

## 2020-10-04 NOTE — Telephone Encounter (Signed)
Noted.   RB please advise of CT 09/28/20. Thanks :)

## 2020-10-05 NOTE — Telephone Encounter (Signed)
I have called and LM on VM for the pt to make her aware of RB recs.  Nothing further is needed.

## 2020-10-05 NOTE — Telephone Encounter (Signed)
I have LM on VM for the pt to call back for results.

## 2020-10-05 NOTE — Telephone Encounter (Signed)
Patient is returning phone call. Patient phone number is [phone].

## 2020-10-05 NOTE — Telephone Encounter (Signed)
Please of the patient know that I reviewed her CT scan of the abdomen.  It confirms that there is an area of hypodensity in the liver.  It looks like this is probably a benign finding but the radiologist's note that I cannot say for sure and that an MRI of the liver would be the better test. I know that she wanted to avoid having an MRI if possible.  I can order this for her if she would like or I could send her to see a gastroenterologist to see if they have any input about the CT scan findings.

## 2020-10-05 NOTE — Telephone Encounter (Signed)
I have called the pt and she is aware of RB recs. She stated that she cannot do the MRI.  She stated that when she had the CT scan done recently with the contrast, she had diarrhea while she was on the table and she cannot do that again.    She said that she has an appt with GI in [place] on 11/19/20 and she wanted to see if RB feels that it would be ok to wait that long?  Please advise. thanks

## 2020-10-05 NOTE — Telephone Encounter (Signed)
Yes, I think it is OK to talk with them at that time. It would be good if we can insure they have access to the films.

## 2020-10-09 ENCOUNTER — Other Ambulatory Visit: Payer: Self-pay | Admitting: Cardiology

## 2020-10-11 DIAGNOSIS — M85841 Other specified disorders of bone density and structure, right hand: Secondary | ICD-10-CM | POA: Diagnosis not present

## 2020-10-11 DIAGNOSIS — Z9889 Other specified postprocedural states: Secondary | ICD-10-CM | POA: Diagnosis not present

## 2020-10-11 DIAGNOSIS — M20011 Mallet finger of right finger(s): Secondary | ICD-10-CM | POA: Diagnosis not present

## 2020-10-11 DIAGNOSIS — M19041 Primary osteoarthritis, right hand: Secondary | ICD-10-CM | POA: Diagnosis not present

## 2020-10-11 DIAGNOSIS — M1811 Unilateral primary osteoarthritis of first carpometacarpal joint, right hand: Secondary | ICD-10-CM | POA: Diagnosis not present

## 2020-10-11 DIAGNOSIS — S63111A Subluxation of metacarpophalangeal joint of right thumb, initial encounter: Secondary | ICD-10-CM | POA: Diagnosis not present

## 2020-10-11 DIAGNOSIS — Z981 Arthrodesis status: Secondary | ICD-10-CM | POA: Diagnosis not present

## 2020-10-17 ENCOUNTER — Telehealth: Payer: Self-pay | Admitting: Emergency Medicine

## 2020-10-17 DIAGNOSIS — K7689 Other specified diseases of liver: Secondary | ICD-10-CM

## 2020-10-17 NOTE — Telephone Encounter (Signed)
Called and spoke with pt and she stated that she would like to be set up with GI with [facility] [place].  She does not want to see the doctors at [place] [facility].     She also is wanting to see if the MRI can be done under general anesthesia.  She stated that she is not able to do this any other way as she is very claustrophobic and that she freaks out and would not be able to lay still even with medication to help her relax.  RB please advise. Thanks   She also stated that she thinks that she is going to cancel the appt with GI in [place] due to she is not having those issues much anymore.  FYI for RB>

## 2020-10-17 NOTE — Telephone Encounter (Signed)
I'm not sure whether they would do the MRI under general anesthesia - radiology may consider this under extreme situations. Would have to discuss with GI and radiology.   I am fine with referring her to [facility] -- that's where I would like for her to go.

## 2020-10-18 NOTE — Telephone Encounter (Signed)
I spoke with the pt and notified of response per RB  She verbalized understanding  Order placed for [facility]

## 2020-10-20 DIAGNOSIS — Z23 Encounter for immunization: Secondary | ICD-10-CM | POA: Diagnosis not present

## 2020-10-25 ENCOUNTER — Telehealth: Payer: Self-pay | Admitting: Allergy and Immunology

## 2020-10-25 NOTE — Telephone Encounter (Signed)
Patient called and would like to pick up some samples of Trelegy 100. [phone]

## 2020-10-25 NOTE — Telephone Encounter (Signed)
Please advise how many Trelegy 100 samples you would like this patient to receive.

## 2020-10-26 NOTE — Telephone Encounter (Signed)
Two samples have been placed in the office as well as a coupon for patient to pick up. Patient will be notified.

## 2020-10-26 NOTE — Telephone Encounter (Signed)
Spoke with [place]. Cree stated she will put 2 Trelegy 100 up front with a coupon for patient to pick up today.

## 2020-10-26 NOTE — Telephone Encounter (Signed)
Patient called back today, 10-26-20, to check on the answer to the previous message. She said she has 3 puffs left and she takes 1 puff a day.

## 2020-10-30 DIAGNOSIS — Z1389 Encounter for screening for other disorder: Secondary | ICD-10-CM | POA: Diagnosis not present

## 2020-10-30 DIAGNOSIS — I2721 Secondary pulmonary arterial hypertension: Secondary | ICD-10-CM | POA: Diagnosis not present

## 2020-10-30 DIAGNOSIS — Z0000 Encounter for general adult medical examination without abnormal findings: Secondary | ICD-10-CM | POA: Diagnosis not present

## 2020-10-30 DIAGNOSIS — I700 Atherosclerosis of aorta: Secondary | ICD-10-CM | POA: Diagnosis not present

## 2020-10-30 DIAGNOSIS — I5020 Unspecified systolic (congestive) heart failure: Secondary | ICD-10-CM | POA: Diagnosis not present

## 2020-10-30 DIAGNOSIS — F39 Unspecified mood [affective] disorder: Secondary | ICD-10-CM | POA: Diagnosis not present

## 2020-10-30 DIAGNOSIS — M810 Age-related osteoporosis without current pathological fracture: Secondary | ICD-10-CM | POA: Diagnosis not present

## 2020-10-30 DIAGNOSIS — I10 Essential (primary) hypertension: Secondary | ICD-10-CM | POA: Diagnosis not present

## 2020-10-30 DIAGNOSIS — J449 Chronic obstructive pulmonary disease, unspecified: Secondary | ICD-10-CM | POA: Diagnosis not present

## 2020-10-30 DIAGNOSIS — E7800 Pure hypercholesterolemia, unspecified: Secondary | ICD-10-CM | POA: Diagnosis not present

## 2020-10-30 DIAGNOSIS — I110 Hypertensive heart disease with heart failure: Secondary | ICD-10-CM | POA: Diagnosis not present

## 2020-10-30 DIAGNOSIS — M069 Rheumatoid arthritis, unspecified: Secondary | ICD-10-CM | POA: Diagnosis not present

## 2020-11-02 ENCOUNTER — Other Ambulatory Visit: Payer: Self-pay | Admitting: Family Medicine

## 2020-11-02 DIAGNOSIS — K769 Liver disease, unspecified: Secondary | ICD-10-CM

## 2020-11-06 ENCOUNTER — Telehealth: Payer: Self-pay | Admitting: Emergency Medicine

## 2020-11-06 ENCOUNTER — Telehealth: Payer: Self-pay | Admitting: Gastroenterology

## 2020-11-06 NOTE — Telephone Encounter (Signed)
Call returned to patient, confirmed DOB. She states she has an appt Friday to get her MRI. She is wanting to get a appt with the GI department with [facility]. She called the office and they told her the MD would look over the info and get back with her in about a week. She is wanting Dr Lamonte Sakai to call the MD and tell the MD to take her as a patient and set up an appt. She is requesting that we call to see if she can get an appt.   Call made to [facility], placed on a long hold.   RB please advise is this urgent. Do we need to call and have patient seen asap. Thanks

## 2020-11-06 NOTE — Telephone Encounter (Signed)
Hey Dr. Tarri Glenn,   Patient would like to transfer care to you for Hepatic cyst. Patient have seen [facility] in 06/2020. Patient does MRI of liver scheduled as well. I have records and will send for review. Could you please review and advise on schedule?  Thank you

## 2020-11-07 ENCOUNTER — Encounter: Payer: Self-pay | Admitting: Gastroenterology

## 2020-11-07 NOTE — Telephone Encounter (Signed)
Let her know that I'll send a note to Dr Tarri Glenn to let her know that the patient would like to see her. Hopefully also expedite having her film reviewed and getting an OV.

## 2020-11-07 NOTE — Telephone Encounter (Signed)
Lft vm to schedule appt

## 2020-11-07 NOTE — Telephone Encounter (Signed)
Called and spoke with patient. She stated that Dr. Tarri Glenn office called her this afternoon and she was able to get scheduled 12/11/20 at 830am.   Nothing further needed at time of call.

## 2020-11-08 DIAGNOSIS — Z9889 Other specified postprocedural states: Secondary | ICD-10-CM | POA: Diagnosis not present

## 2020-11-08 DIAGNOSIS — M19041 Primary osteoarthritis, right hand: Secondary | ICD-10-CM | POA: Diagnosis not present

## 2020-11-08 DIAGNOSIS — M189 Osteoarthritis of first carpometacarpal joint, unspecified: Secondary | ICD-10-CM | POA: Diagnosis not present

## 2020-11-08 DIAGNOSIS — M85841 Other specified disorders of bone density and structure, right hand: Secondary | ICD-10-CM | POA: Diagnosis not present

## 2020-11-08 DIAGNOSIS — Z981 Arthrodesis status: Secondary | ICD-10-CM | POA: Diagnosis not present

## 2020-11-08 DIAGNOSIS — M20011 Mallet finger of right finger(s): Secondary | ICD-10-CM | POA: Diagnosis not present

## 2020-11-09 ENCOUNTER — Ambulatory Visit
Admission: RE | Admit: 2020-11-09 | Discharge: 2020-11-09 | Disposition: A | Discharge status: Home / Self Care | Payer: HMO | Source: Ambulatory Visit | Attending: Family Medicine | Admitting: Family Medicine

## 2020-11-09 DIAGNOSIS — K769 Liver disease, unspecified: Secondary | ICD-10-CM

## 2020-11-09 DIAGNOSIS — K8020 Calculus of gallbladder without cholecystitis without obstruction: Secondary | ICD-10-CM | POA: Diagnosis not present

## 2020-11-09 DIAGNOSIS — K7689 Other specified diseases of liver: Secondary | ICD-10-CM | POA: Diagnosis not present

## 2020-11-09 DIAGNOSIS — C3490 Malignant neoplasm of unspecified part of unspecified bronchus or lung: Secondary | ICD-10-CM | POA: Diagnosis not present

## 2020-11-09 DIAGNOSIS — K5730 Diverticulosis of large intestine without perforation or abscess without bleeding: Secondary | ICD-10-CM | POA: Diagnosis not present

## 2020-11-09 MED ORDER — GADOBENATE DIMEGLUMINE 529 MG/ML IV SOLN
15.0000 mL | Freq: Once | INTRAVENOUS | Status: AC | PRN
  Administered 2020-11-09: 15 mL via INTRAVENOUS

## 2020-11-12 NOTE — Telephone Encounter (Signed)
RB please advise. Thanks  Results of MRI of Liver are now posted in my chart. Please review and advise me. Thanks. Taylor Berry

## 2020-11-19 DIAGNOSIS — M0589 Other rheumatoid arthritis with rheumatoid factor of multiple sites: Secondary | ICD-10-CM | POA: Diagnosis not present

## 2020-11-27 ENCOUNTER — Telehealth: Payer: Self-pay | Admitting: Allergy and Immunology

## 2020-11-27 ENCOUNTER — Telehealth: Payer: Self-pay | Admitting: Emergency Medicine

## 2020-11-27 NOTE — Telephone Encounter (Signed)
Pt states she wants to try and get Trelegy and does not want to take the Stiolto at this time. Pt will call back if she changes her mind. Nothing further needed at time.

## 2020-11-27 NOTE — Telephone Encounter (Signed)
Patient is requesting samples of Trelegy 153mg. Best contact number: [phone]

## 2020-11-27 NOTE — Telephone Encounter (Signed)
Stiolto could be used, although it does not have all of the components included in Trelegy

## 2020-11-27 NOTE — Telephone Encounter (Signed)
Called patient and let her know I have placed a sample of Trelegy up front for her to pick up. Did advise patient that she would need an appointment for any further samples. Patient scheduled an appointment for tomorrow, 11-28-2020 with Dr. Simona Huh. Patient could not wait until 01-22-2021 with Dr. Neldon Mc. Patient states she will pick up sample tomorrow at appointment.

## 2020-11-27 NOTE — Telephone Encounter (Signed)
Have Taylor Berry looking into if the [place] office has samples or not

## 2020-11-27 NOTE — Telephone Encounter (Signed)
Call returned to patient, confirmed DOB. Made aware we do no have any samples of trelegy. She states he is having a hard time affording the trelegy. I made her aware I would mail her a patient assistance application.   RB please advise patient is asking if there are any other samples she can use in the mean time. We have only spiriva and stiolto in the office. Thanks :)

## 2020-11-27 NOTE — Progress Notes (Signed)
FOLLOW UP Date of Service/Encounter:  11/28/20   Subjective:  Taylor Berry (DOB: 1940/12/21) is a 80 y.o. female with a past medical history of RA, COPD with asthma, allergic rhinitis, LPR, and colestipol responsive diarrhea who returns to the [facility] on 11/28/2020 in re-evaluation of the following: asthma/COPD overlap, LPR, and diarrhea. History obtained from: chart review and patient.  For Review, LV was on 05/01/2020 with Dr. Neldon Mc seen for asthma/COPD overlap, LPR, and diarrhea.  For asthma she was continued on her Trelegy 100, 1 puff once per day and advised to use Biotene prior to and after Trelegy use for new problem of dry mouth.  FEV1 51% of predicted at that visit. She was continued on her Flonase as well as famotidine 40 mg once per day.  She was going to attempt stopping Prevacid 30 mg once per day (again for new problem a dry mouth).  She was continued on colestipol for chronic diarrhea.  In the interim she did have colonoscopy on 07/18/2020 which showed :"Palisading granulomatous inflammation with necrosis consistent with rheumatoid nodules. Negative for fungal organisms by special stains GMS and PAS. Negative for acid-fast bacilli by special stain Ziehl-Neelsen. No malignancy identified." ANA on 05/01/2020 was negative  Today she presents for follow-up. Asthma/COPD:  She feels her asthma has been very well controlled on her Trelegy.   She is here to pick-up her Trelegy 100, 1 puff daily.  We have been giving samples because this is the only one that works for her, and she has not been able to afford the current co-pay.  She continues to use the Biotene after the Trelegy and this has helped significantly with her dry mouth.  She also follows with Pulmonology, and they did not have any Trelegy available.   She does use Flonase as needed (usually once per year).  She does continue to take her famotidine.  She is no longer taking colestipol and is only taking  probiotics.  However, continues to have diarrhea.  She is going to see a new GI physician at [facility] on Dec. 3rd.  Allergies as of 11/28/2020       Reactions   Latex    REACTION: itching   Ace Inhibitors    REACTION: cough   Fentanyl    Hallucination "I go crazy"    Hydrochlorothiazide    REACTION: decreased sodium   Penicillins Other (See Comments)   REACTION: intolerant due to yeast infection Has patient had a PCN reaction causing immediate rash, facial/tongue/throat swelling, SOB or lightheadedness with hypotension:No Has patient had a PCN reaction causing severe rash involving mucus membranes or skin necrosis: No Has patient had a PCN reaction that required hospitalization No Has patient had a PCN reaction occurring within the last 10 years: Yes If all of the above answers are "NO", then may proceed with Cephalosporin use.        Medication List        Accurate as of November 28, 2020  1:16 PM. If you have any questions, ask your nurse or doctor.          albuterol 108 (90 Base) MCG/ACT inhaler Commonly known as: VENTOLIN HFA INHALE 2 PUFFS BY MOUTH IN EACH NOSTRIL EVERY 4 TO 6 HOURS AS NEEDED   aspirin 81 MG EC tablet Take 81 mg by mouth daily.   aspirin 81 MG chewable tablet 1 tablet   atorvastatin 40 MG tablet Commonly known as: LIPITOR 1 tablet  atorvastatin 20 MG tablet Commonly known as: LIPITOR Take 1 tablet by mouth daily.   atorvastatin 40 MG tablet Commonly known as: LIPITOR TAKE 1 TABLET(40 MG) BY MOUTH DAILY   candesartan 4 MG tablet Commonly known as: ATACAND 8 mg.   candesartan 8 MG tablet Commonly known as: ATACAND TAKE 1 TABLET(8 MG) BY MOUTH DAILY   chlorhexidine 0.12 % solution Commonly known as: PERIDEX SMARTSIG:By Mouth   colestipol 1 g tablet Commonly known as: COLESTID 1 tablet   colestipol 1 g tablet Commonly known as: COLESTID 1-2 tablets twice a day (4 max)   colestipol 1 g tablet Commonly known as:  COLESTID Take 1-2 tablets 1-2 times daily as directed for diarrhea   denosumab 60 MG/ML Sosy injection Commonly known as: PROLIA Inject 60 mg into the skin every 6 (six) months.   famotidine 40 MG tablet Commonly known as: PEPCID 1 tablet at bedtime.   famotidine 40 MG tablet Commonly known as: PEPCID TAKE 1 TABLET(40 MG) BY MOUTH EVERY EVENING   fluticasone 50 MCG/ACT nasal spray Commonly known as: FLONASE 1 spray in each nostril   fluticasone 50 MCG/ACT nasal spray Commonly known as: FLONASE Use 1-2 sprays per nostril daily   furosemide 20 MG tablet Commonly known as: LASIX Take as needed for >3lbs weight gain in one day or >5lbs weight gain in one week; or for swelling   lansoprazole 30 MG capsule Commonly known as: PREVACID 1 capsule   lansoprazole 30 MG capsule Commonly known as: PREVACID Take 1 capsule (30 mg total) by mouth every morning.   leflunomide 20 MG tablet Commonly known as: ARAVA Take 20 mg by mouth daily.   metoprolol succinate 25 MG 24 hr tablet Commonly known as: TOPROL-XL TAKE 1 TABLET(25 MG) BY MOUTH DAILY   Pfizer-BioNT COVID-19 Vac-TriS Susp injection Generic drug: COVID-19 mRNA Vac-TriS (Pfizer) Inject into the muscle.   predniSONE 1 MG tablet Commonly known as: DELTASONE Take 1 mg by mouth daily with breakfast.   predniSONE 5 MG (48) Tbpk tablet Commonly known as: STERAPRED UNI-PAK 48 TAB Take by mouth as directed.   Trelegy Ellipta 100-62.5-25 MCG/ACT Aepb Generic drug: Fluticasone-Umeclidin-Vilant 1 puff   Trelegy Ellipta 100-62.5-25 MCG/ACT Aepb Generic drug: Fluticasone-Umeclidin-Vilant Inhale 1 puff into the lungs daily.   Trelegy Ellipta 100-62.5-25 MCG/ACT Aepb Generic drug: Fluticasone-Umeclidin-Vilant Inhale 1 puff into the lungs daily.   venlafaxine XR 75 MG 24 hr capsule Commonly known as: EFFEXOR-XR Take 75 mg by mouth daily.       Past Medical History:  Diagnosis Date   Abnormal CT scan    Allergic  rhinitis    Anemia 2015   after lung surgery   Angio-edema    Complication of anesthesia    Fentanyl allergy, "claustrophobia"   COPD (chronic obstructive pulmonary disease) ([place])    Depression    Diaphoresis 07/30/2015   Excessive sweating with little exertion   Difficult intravenous access    GERD (gastroesophageal reflux disease)    In the past   Hyperlipidemia    Hypertension    Left bundle branch block    Lung cancer ([place]) 2015   left upper lobe wedge removed-no further tx.   Neuropathy    Tingling in arms, fingers, and feet (Since 06/15/16)   Obesity    OSA (obstructive sleep apnea)    denies.   Osteoarthritis    ra also   Osteoporosis    Recurrent upper respiratory infection (URI)    Rheumatoid arthritis(714.0)  Dr. Stark Jock follows   SOB (shortness of breath) on exertion    Uterine cancer ([place]) 1971   tx surgical   Past Surgical History:  Procedure Laterality Date   ABDOMINAL HYSTERECTOMY     in situ carcinoma partial   CARDIAC CATHETERIZATION  01/27/11   minor non-obs CAD, NL EF, mild pulm HTN   CATARACT EXTRACTION, BILATERAL     last done 12'16-   COLONOSCOPY W/ BIOPSIES AND POLYPECTOMY     COLONOSCOPY WITH PROPOFOL N/A 02/13/2015   Procedure: COLONOSCOPY WITH PROPOFOL;  Surgeon: Laurence Spates, MD;  Location: [facility];  Service: Endoscopy;  Laterality: N/A;   ESOPHAGOGASTRODUODENOSCOPY (EGD) WITH PROPOFOL N/A 09/30/2016   Procedure: ESOPHAGOGASTRODUODENOSCOPY (EGD) WITH PROPOFOL;  Surgeon: Laurence Spates, MD;  Location: [facility];  Service: Endoscopy;  Laterality: N/A;   LYMPH NODE DISSECTION Left 04/12/2013   Procedure: LYMPH NODE DISSECTION;  Surgeon: [name], MD;  Location: [facility];  Service: Thoracic;  Laterality: Left;   RIGHT/LEFT HEART CATH AND CORONARY ANGIOGRAPHY N/A 10/08/2016   Procedure: RIGHT/LEFT HEART CATH AND CORONARY ANGIOGRAPHY;  Surgeon: Burnell Blanks, MD;  Location: [facility];  Service: Cardiovascular;   Laterality: N/A;   TONSILLECTOMY     VESICOVAGINAL FISTULA CLOSURE W/ TAH  1971   VIDEO ASSISTED THORACOSCOPY (VATS)/THOROCOTOMY Left 04/12/2013   Procedure: VIDEO ASSISTED THORACOSCOPY (VATS)/THOROCOTOMY, WITH LEFT UPPER LOBE WEDGE RESECTION, CHEST WALL BIOPSY ;  Surgeon: [name], MD;  Location: [facility];  Service: Thoracic;  Laterality: Left;   VIDEO BRONCHOSCOPY N/A 04/12/2013   Procedure: VIDEO BRONCHOSCOPY;  Surgeon: [name], MD;  Location: [facility];  Service: Thoracic;  Laterality: N/A;   Otherwise, there have been no changes to her past medical history, surgical history, family history, or social history.  ROS: All others negative except as noted per HPI.   Objective:  BP 136/62   Pulse 62   Temp (!) 97.2 F (36.2 C) (Temporal)   Resp 16   Ht 5' 1.81" (1.57 m)   Wt 167 lb 9.6 oz (76 kg)   BMI 30.84 kg/m  Body mass index is 30.84 kg/m. Physical Exam: General Appearance:  Alert, cooperative, no distress, appears stated age  Head:  Normocephalic, without obvious abnormality, atraumatic  Eyes:  Conjunctiva clear, EOM's intact  Nose: Nares normal, pink mucosa  Throat: Lips, tongue normal; teeth and gums normal, normal posterior oropharnyx  Neck: Supple, symmetrical  Lungs:   Fine crackles throughout, Respirations unlabored, no coughing  Heart:  RRR, no murmur, Appears well perfused  Extremities: No edema  Skin: Skin color, texture, turgor normal, no rashes or lesions on visualized portions of skin  Neurologic: No gross deficits    Spirometry:  Tracings reviewed. Her effort: Good reproducible efforts. FVC: 1.51L FEV1: 0.99L, 60% predicted FEV1/FVC ratio: 89% Interpretation: Spirometry consistent with mild obstructive disease.  Please see scanned spirometry results for details.   Assessment/Plan  Her spirometry is consistent with previous.   She has had to use samples of Trelelgy this year due to lack of patient assistance, but we have been low on these  samples.  Discussed trial of Breztri as a plan B if we were to be unable to provide samples. Regarding diarrhea, it remains unchanged.  Recent colonoscopy confirms rheumatoid nodules which is likely source of her symptoms.  She is seeing GI in 2 weeks. I did not restart colestipol as she noted no difference when stopping. Patient Instructions  1.  Continue to treat reflux  and LPR with the following combination:   A. Famotidine 40mg  1 time per day in PM  2.  Continue flonase -1-2 sprays each nostril 1 time per day  3.  Continue Trelegy 100 - 1 inhalation 1 time per day. Use Biotene before inhaling.  Rinse mouth after inhaling.  Then reuse Biotene after rinsing. If we can not get samples of Trelegy, can use Breztri 2 puffs twice a day in place of Trelegy until we are able to get a different patient assistance for coverage.  4.  If needed:   A. Pro-air HFA 2 inhalations every 4-6 hours.    5.  Return to clinic in 6 months or earlier if problem            Sigurd Sos, MD  [facility]

## 2020-11-28 ENCOUNTER — Other Ambulatory Visit: Payer: Self-pay

## 2020-11-28 ENCOUNTER — Ambulatory Visit: Payer: HMO | Admitting: Internal Medicine

## 2020-11-28 ENCOUNTER — Encounter: Payer: Self-pay | Admitting: Internal Medicine

## 2020-11-28 VITALS — BP 136/62 | HR 62 | Temp 97.2°F | Resp 16 | Ht 61.81 in | Wt 167.6 lb

## 2020-11-28 DIAGNOSIS — J4489 Other specified chronic obstructive pulmonary disease: Secondary | ICD-10-CM

## 2020-11-28 DIAGNOSIS — J449 Chronic obstructive pulmonary disease, unspecified: Secondary | ICD-10-CM | POA: Diagnosis not present

## 2020-11-28 DIAGNOSIS — J309 Allergic rhinitis, unspecified: Secondary | ICD-10-CM | POA: Diagnosis not present

## 2020-11-28 NOTE — Addendum Note (Signed)
Addended by: Eloy End D on: 11/28/2020 04:28 PM   Modules accepted: Orders

## 2020-11-28 NOTE — Patient Instructions (Addendum)
1.  Continue to treat reflux and LPR with the following combination:   A. Famotidine 40mg  1 time per day in PM  2.  Continue flonase -1-2 sprays each nostril 1 time per day  3.  Continue Trelegy 100 - 1 inhalation 1 time per day. Use Biotene before inhaling.  Rinse mouth after inhaling.  Then reuse Biotene after rinsing. If we can not get samples of Trelegy, can use Breztri 2 puffs twice a day in place of Trelegy until we are able to get a different patient assistance for coverage.  4.  If needed:   A. Pro-air HFA 2 inhalations every 4-6 hours.    5.  Return to clinic in 6 months or earlier if problem

## 2020-12-11 ENCOUNTER — Encounter: Payer: Self-pay | Admitting: Gastroenterology

## 2020-12-11 ENCOUNTER — Ambulatory Visit: Payer: HMO | Admitting: Gastroenterology

## 2020-12-11 ENCOUNTER — Telehealth: Payer: Self-pay

## 2020-12-11 ENCOUNTER — Telehealth: Payer: Self-pay | Admitting: Gastroenterology

## 2020-12-11 VITALS — BP 110/70 | HR 68 | Ht 62.0 in | Wt 165.2 lb

## 2020-12-11 DIAGNOSIS — Z6828 Body mass index (BMI) 28.0-28.9, adult: Secondary | ICD-10-CM | POA: Diagnosis not present

## 2020-12-11 DIAGNOSIS — R197 Diarrhea, unspecified: Secondary | ICD-10-CM | POA: Diagnosis not present

## 2020-12-11 DIAGNOSIS — M810 Age-related osteoporosis without current pathological fracture: Secondary | ICD-10-CM | POA: Diagnosis not present

## 2020-12-11 DIAGNOSIS — R159 Full incontinence of feces: Secondary | ICD-10-CM | POA: Diagnosis not present

## 2020-12-11 DIAGNOSIS — R152 Fecal urgency: Secondary | ICD-10-CM | POA: Diagnosis not present

## 2020-12-11 DIAGNOSIS — R932 Abnormal findings on diagnostic imaging of liver and biliary tract: Secondary | ICD-10-CM | POA: Diagnosis not present

## 2020-12-11 DIAGNOSIS — R0602 Shortness of breath: Secondary | ICD-10-CM | POA: Diagnosis not present

## 2020-12-11 DIAGNOSIS — R682 Dry mouth, unspecified: Secondary | ICD-10-CM | POA: Diagnosis not present

## 2020-12-11 DIAGNOSIS — M06322 Rheumatoid nodule, left elbow: Secondary | ICD-10-CM | POA: Diagnosis not present

## 2020-12-11 DIAGNOSIS — M150 Primary generalized (osteo)arthritis: Secondary | ICD-10-CM | POA: Diagnosis not present

## 2020-12-11 DIAGNOSIS — M25552 Pain in left hip: Secondary | ICD-10-CM | POA: Diagnosis not present

## 2020-12-11 DIAGNOSIS — M0589 Other rheumatoid arthritis with rheumatoid factor of multiple sites: Secondary | ICD-10-CM | POA: Diagnosis not present

## 2020-12-11 DIAGNOSIS — M25562 Pain in left knee: Secondary | ICD-10-CM | POA: Diagnosis not present

## 2020-12-11 DIAGNOSIS — Z79899 Other long term (current) drug therapy: Secondary | ICD-10-CM | POA: Diagnosis not present

## 2020-12-11 DIAGNOSIS — M25561 Pain in right knee: Secondary | ICD-10-CM | POA: Diagnosis not present

## 2020-12-11 DIAGNOSIS — E663 Overweight: Secondary | ICD-10-CM | POA: Diagnosis not present

## 2020-12-11 MED ORDER — COLESTIPOL HCL 1 G PO TABS: 2.0000 g | ORAL_TABLET | Freq: Two times a day (BID) | ORAL | 2 refills | Status: DC

## 2020-12-11 MED ORDER — FLUTICASONE-UMECLIDIN-VILANT 100-62.5-25 MCG/ACT IN AEPB: 1.0000 | INHALATION_SPRAY | Freq: Every day | RESPIRATORY_TRACT | 5 refills | Status: DC

## 2020-12-11 NOTE — Telephone Encounter (Signed)
Patient called to see if she can have samples of Trelegy 100. She has 3 puffs left on the current sample.  Please advise

## 2020-12-11 NOTE — Patient Instructions (Signed)
It was my pleasure to provide care to you today. Based on our discussion, I am providing you with my recommendations below:  RECOMMENDATION(S):   - Continue daily Metamucil - you could even increase this to twice daily - Avoid sugars and caffeine - Keep a food and symptom diary to identify causitive factors - Keep your bottom clean and dry without excessive wiping or using astringent cleaners - Apply a barrier cream such as zinc oxide to the perianal skin - I have recommended stool studies - if these haven't been done by Dr. Laurann Montana - A colonoscopy would be the best way to determine if colitis is causing these symptoms  PRESCRIPTION MEDICATION(S):   We have sent the following medication(s) to your pharmacy:  Colestipol - please take twice daily  NOTE: If your medication(s) requires a PRIOR AUTHORIZATION, we will receive notification from your pharmacy. Once received, the process to submit for approval may take up to 7-10 business days. You will be contacted about any denials we have received from your insurance company as well as alternatives recommended by your provider.  LABS:   Please proceed to the basement level for lab work before leaving today. Press "B" on the elevator. The lab is located at the first door on the left as you exit the elevator.  HEALTHCARE LAWS AND MY CHART RESULTS:   Due to recent changes in healthcare laws, you may see results of your imaging and/or laboratory studies on MyChart before I have had a chance to review them.  I understand that in some cases there may be results that are confusing or concerning to you. Please understand that not all results are received at the same time and often I may need to interpret multiple results in order to provide you with the best plan of care or course of treatment. Therefore, I ask that you please give me 48 hours to thoroughly review all your results before contacting my office for clarification.   RECORDS:  We have  asked you to sign a Release of Information today to obtain records from Dr.'s Oletta Lamas and [place].   COLONOSCOPY:   You have been scheduled for a colonoscopy. Please follow written instructions given to you at your visit today.   PREP:   Please pick up your prep supplies at the pharmacy within the next 1-3 days.  INHALERS:   If you use inhalers (even only as needed), please bring them with you on the day of your procedure.  COLONOSCOPY TIPS:  To reduce nausea and dehydration, stay well hydrated for 3-4 days prior to the exam.  To prevent skin/hemorrhoid irritation - prior to wiping, put A&Dointment or vaseline on the toilet paper. Keep a towel or pad on the bed.  BEFORE STARTING YOUR PREP, drink  64oz of clear liquids in the morning. This will help to flush the colon and will ensure you are well hydrated!!!!  NOTE - This is in addition to the fluids required for to complete your prep. Use of a flavored hard candy, such as grape Anise Salvo, can counteract some of the flavor of the prep and may prevent some nausea.   IMAGING:  You will be contacted by [facility] Scheduling (Your caller ID will indicate phone # [phone]) closer to  May 2023 to schedule your MRI in MAY 2023. If you have not heard from them within 7-10 business days, please call [facility] at [phone] to follow up on the status of your  appointment.    FOLLOW UP:  After your procedure, you will receive a call from my office staff regarding my recommendation for follow up.  BMI:  If you are age 52 or older, your body mass index should be between 23-30. Your Body mass index is 30.22 kg/m. If this is out of the aforementioned range listed, please consider follow up with your Primary Care Provider.  MY CHART:  The [address] [facility] providers would like to encourage you to use [facility] to communicate with providers for non-urgent requests or questions.  Due to long hold times on the  telephone, sending your provider a message by [facility] may be a faster and more efficient way to get a response.  Please allow 48 business hours for a response.  Please remember that this is for non-urgent requests.   Thank you for trusting me with your gastrointestinal care!    Thornton Park, MD, MPH

## 2020-12-11 NOTE — Progress Notes (Signed)
Called referring provider's office, Dr. Laurann Montana to request most recent labs from April 2022 to present be faxed to our office @ [phone]. Will await records.  Records Request  Provider and/or Facility: Dr. Oletta Lamas  Document: Signed ROI  ROI has been faxed successfully to Dr. Oletta Lamas. Document(s) and fax confirmation(s) have been placed in the faxed file for future reference.

## 2020-12-11 NOTE — Telephone Encounter (Signed)
Review of records from [place] primary care:  04/10/2020: Normal CMP except for BUN of 28, fecal occult blood negative, iron 85, iron saturation 21%, TIBC 396, hemoglobin 10.8, MCV 89.8, RDW 15.9, platelets 172

## 2020-12-11 NOTE — Telephone Encounter (Signed)
Called pt to make her aware of Dr. Tarri Glenn review of her labs from Dr. Delene Ruffini office. Advised she will need to ensure she completes her stool studies as advised at today's visit. Reminded about the location of the lab as written on her AVS. Asked that she complete these labs PRIOR to her colonoscopy. Verbalized acceptance and understanding.

## 2020-12-11 NOTE — Telephone Encounter (Signed)
Called and left a message for patient to inform her that there are no samples of the Trelergy in our [place] office but I would check to see if other locations had samples. Since she is low on inhalations I did send in a refill so that she can pick one up tomorrow to hold her over.

## 2020-12-11 NOTE — Progress Notes (Addendum)
Referring Provider: Kelton Pillar, MD Primary Care Physician:  Kelton Pillar, MD   Reason for Consultation: Hepatic cyst   IMPRESSION:  Fecal incontinence: Previously evaluated and worked-up by Dr. Oletta Lamas and Dr. Michail Sermon, most recently earlier this year with recommendation that she see a specialist at [facility] after declining colonoscopy. No response to colestipol 2 g daily. Unfortunately, none of her prior records regarding this evaluation or treatment was available to me today. Must consider carbohydrate intolerance particularly given her trigger by eating sugar, bacterial overgrowth, diet, medications, bile salt malabsorption, autonomic dysfunction, IBS, microscopic colitis, and IBD. Colonoscopy with random biopsies recommended to evaluate for microscopic colitis.   Liver lesion identified on CT and confirmed on MRI. By MRI, most likely hemangioma versus FNH. There are no associated symptoms. Given the potential increase in size compared to 2018, will plan to repeat imaging in 6 months, although malignant transformation is unusual with both of these diagnoses.   PLAN: - See patient instructions for dietary recommendations - Continue Metamucil with increasing dosing frequency to BID - Stool for GI pathogen panel, fecal calprotectin, and fecal elastase - Increase colestipol to 2 mg BID - Obtain prior records from Dr. Oletta Lamas including prior office visits, colonoscopy, and pathology results - Obtain stool tests from Dr. Laurann Montana from April 2022 - Colonoscopy with random colon biopsies - Will proceed with anorectal manometry +/- endoscopic ultrasound +/- pelvic floor MRI if there is poor response to medical therapy and colonoscopy is negative - MRI May 2022  I spent 60 minutes, including in depth chart review, independent review of results, communicating results with the patient directly, face-to-face time with the patient, ordering studies and medications as appropriate, and  documentation.      HPI: Taylor Berry is a 80 y.o. female self-referred for hepatic cysts.  History is obtained through the patient and review of her electronic health record.  She has severe COPD, obesity with untreated OSA, rheumatoid arthritis, uterine cancer, CHF, anxiety, hypertension, and depression. She has a history of alcoholism.  She had squamous cell lung cancer treated with a left upper lobe wedge resection. Dr. Malvin Johns has been following nodular disease by CT chest with slow increase in size in a medial right lower lobe nodule since CT chest 04/17/2016.  On the most recent Chest CT 09/07/20 a liver lesion was identified and was thought to be new compared to prior CT scans.  Dr. Malvin Johns reached out to me and asked me to expedite her evaluation.  Prior GI care has been through [facility] with Dr. Percell Miller and Dr. Michail Sermon. She was last seen by [facility] in the summer of 2022.  They last referred her to a specialist at [facility].   Dedicated CT abd/pelvis with contrast 09/28/2020 showed a 1.9 x 1.5 cm incompletely characterized hyperenhancing lesion in the liver.  Gallstones were also present.  MRI with and without contrast 11/09/2020 confirmed a 2.3 x 1.7 cm lesion in hepatic segment 5 abutting the gallbladder fossa with an enhancement pattern consistent with focal nodular hyperplasia or possibly hemangioma.  The radiologist felt this lesion was subtly present on a CT in 2018 although it has probably increased in size.  Liver enzymes 10/03/2016 were normal and most recently 10/30/20 with AST 22, ALT 9, alk phos 37, TB 0.6  However, her concern today is fecal incontinence with leakage of semiformed stool. Some concurrent urinary incontinence. She has had diarrhea over the last 2 years despite using Metamucil. She has awareness of  the need to defecation. She has to wear pads due to the soiling. No sense of complete evacuation. She was previously taking colestipol 2g daily for a couple of years and  probiotics/prebiotics and did not find it helpful. No blood or mucous in the stool.  No abdominal pain or flatus. No nocturnal episodes. No identified precipitating events. No recent change in stool consistency.  Decreased rectal sensation.  No history of prior hemorrhoid, fissure or anorectal surgery, pelvic irradiation, or neurologic disease.   At the time of her last visit with Dr. Michail Sermon, he recommended a colonoscopy. However, she didn't feel that she could get through a colonoscopy due to the diarrhea that she experienced after her CT. No nocturnal episodes.  Symptoms may be worse by eating sugar.   Labs in Epic show negative Alpha-Gal testing in 2019.   She reports Dr. Laurann Montana performed stool studies in April.Those results are not available to me today.   Endoscopic history: - She reports multiple prior colonoscopies -Surveillance colonoscopy with Dr. Oletta Lamas 02/13/2015 for history of colon polyps: Extensive pancolonic diverticulosis and internal hemorrhoids.  Given her age surveillance colonoscopy was not recommended. -EGD with Dr. Oletta Lamas 09/30/2016 for abnormal imaging of the GI tract showed a small hiatal hernia, normal gastric mucosa, and a few small nodules in the duodenal bulb thought to be duodenitis.  Duodenal biopsy showed peptic duodenitis with Brunner's gland hyperplasia.  Past Medical History:  Diagnosis Date   Abnormal CT scan    Allergic rhinitis    Anemia 2015   after lung surgery   Angio-edema    Complication of anesthesia    Fentanyl allergy, "claustrophobia"   COPD (chronic obstructive pulmonary disease) ([place])    Depression    Diaphoresis 07/30/2015   Excessive sweating with little exertion   Difficult intravenous access    GERD (gastroesophageal reflux disease)    In the past   Hyperlipidemia    Hypertension    Left bundle branch block    Lung cancer ([place]) 2015   left upper lobe wedge removed-no further tx.   Neuropathy    Tingling in arms, fingers, and  feet (Since 06/15/16)   Obesity    OSA (obstructive sleep apnea)    denies.   Osteoarthritis    ra also   Osteoporosis    Recurrent upper respiratory infection (URI)    Rheumatoid arthritis(714.0)    Dr. Amil Amen   SOB (shortness of breath) on exertion    Uterine cancer ([place]) 1971   tx surgical    Past Surgical History:  Procedure Laterality Date   ABDOMINAL HYSTERECTOMY     in situ carcinoma partial   CARDIAC CATHETERIZATION  01/27/11   minor non-obs CAD, NL EF, mild pulm HTN   CATARACT EXTRACTION, BILATERAL     last done 12'16-   COLONOSCOPY W/ BIOPSIES AND POLYPECTOMY     COLONOSCOPY WITH PROPOFOL N/A 02/13/2015   Procedure: COLONOSCOPY WITH PROPOFOL;  Surgeon: Laurence Spates, MD;  Location: [facility];  Service: Endoscopy;  Laterality: N/A;   ESOPHAGOGASTRODUODENOSCOPY (EGD) WITH PROPOFOL N/A 09/30/2016   Procedure: ESOPHAGOGASTRODUODENOSCOPY (EGD) WITH PROPOFOL;  Surgeon: Laurence Spates, MD;  Location: [facility];  Service: Endoscopy;  Laterality: N/A;   LYMPH NODE DISSECTION Left 04/12/2013   Procedure: LYMPH NODE DISSECTION;  Surgeon: [name], MD;  Location: [facility];  Service: Thoracic;  Laterality: Left;   RIGHT/LEFT HEART CATH AND CORONARY ANGIOGRAPHY N/A 10/08/2016   Procedure: RIGHT/LEFT HEART CATH AND CORONARY ANGIOGRAPHY;  Surgeon: Lauree Chandler  D, MD;  Location: [facility];  Service: Cardiovascular;  Laterality: N/A;   TONSILLECTOMY     VESICOVAGINAL FISTULA CLOSURE W/ TAH  1971   VIDEO ASSISTED THORACOSCOPY (VATS)/THOROCOTOMY Left 04/12/2013   Procedure: VIDEO ASSISTED THORACOSCOPY (VATS)/THOROCOTOMY, WITH LEFT UPPER LOBE WEDGE RESECTION, CHEST WALL BIOPSY ;  Surgeon: [name], MD;  Location: [facility];  Service: Thoracic;  Laterality: Left;   VIDEO BRONCHOSCOPY N/A 04/12/2013   Procedure: VIDEO BRONCHOSCOPY;  Surgeon: [name], MD;  Location: [facility];  Service: Thoracic;  Laterality: N/A;    Current Outpatient Medications  Medication  Sig Dispense Refill   albuterol (VENTOLIN HFA) 108 (90 Base) MCG/ACT inhaler INHALE 2 PUFFS BY MOUTH IN EACH NOSTRIL EVERY 4 TO 6 HOURS AS NEEDED 18 g 0   AMBULATORY NON FORMULARY MEDICATION Synogut Take 2 tablet by mouth daily     aspirin 81 MG EC tablet Take 81 mg by mouth daily.     atorvastatin (LIPITOR) 40 MG tablet TAKE 1 TABLET(40 MG) BY MOUTH DAILY 90 tablet 3   CALCIUM-VITAMIN D PO Take 1 tablet by mouth daily.     candesartan (ATACAND) 8 MG tablet TAKE 1 TABLET(8 MG) BY MOUTH DAILY 90 tablet 3   colestipol (COLESTID) 1 g tablet Take 2 tablets (2 g total) by mouth 2 (two) times daily. 60 tablet 2   denosumab (PROLIA) 60 MG/ML SOSY injection Inject 60 mg into the skin every 6 (six) months.     famotidine (PEPCID) 40 MG tablet 1 tablet at bedtime.     fluticasone (FLONASE) 50 MCG/ACT nasal spray 1-2 sprays as needed.     lansoprazole (PREVACID) 30 MG capsule Take 1 capsule (30 mg total) by mouth every morning. 30 capsule 5   leflunomide (ARAVA) 20 MG tablet Take 20 mg by mouth daily.     metoprolol succinate (TOPROL-XL) 25 MG 24 hr tablet TAKE 1 TABLET(25 MG) BY MOUTH DAILY 90 tablet 3   Fluticasone-Umeclidin-Vilant (TRELEGY ELLIPTA) 100-62.5-25 MCG/ACT AEPB Inhale 1 puff into the lungs daily. 60 each 5   No current facility-administered medications for this visit.    Allergies as of 12/11/2020 - Review Complete 12/11/2020  Allergen Reaction Noted   Latex  06/27/2009   Ace inhibitors     Fentanyl  01/30/2015   Hydrochlorothiazide     Penicillins Other (See Comments)     Family History  Problem Relation Age of Onset   Lung cancer Mother    Coronary artery disease Son    Heart attack Son 76   Arthritis/Rheumatoid Maternal Grandmother    Coronary artery disease Son    Arthritis/Rheumatoid Son      Review of Systems: 12 system ROS is negative except as noted above except for arthritis, back pain, shortness of breath, and urinary incontinence.   Physical Exam: General:    Alert,  well-nourished, pleasant and cooperative in NAD, tearful today when discussing the diarrhea and colonoscopy Head:  Normocephalic and atraumatic. Eyes:  Sclera clear, no icterus.   Conjunctiva pink. Ears:  Normal auditory acuity. Nose:  No deformity, discharge,  or lesions. Mouth:  No deformity or lesions.   Neck:  Supple; no masses or thyromegaly. Lungs:  Clear throughout to auscultation.   No wheezes. Heart:  Regular rate and rhythm; no murmurs. Abdomen:  Soft, nontender, nondistended, normal bowel sounds, no rebound or guarding. No hepatosplenomegaly.   Rectal:  Deferred  Msk:  Symmetrical. No boney deformities LAD: No inguinal or umbilical LAD Extremities:  No clubbing or edema. Neurologic:  Alert and  oriented x4;  grossly nonfocal Skin:  Intact without significant lesions or rashes. Psych:  Alert and cooperative. Normal mood and affect.  \  Revanth Neidig L. Tarri Glenn, MD, MPH 12/17/2020, 11:19 AM

## 2020-12-12 ENCOUNTER — Other Ambulatory Visit: Payer: HMO

## 2020-12-12 DIAGNOSIS — R932 Abnormal findings on diagnostic imaging of liver and biliary tract: Secondary | ICD-10-CM | POA: Diagnosis not present

## 2020-12-12 DIAGNOSIS — R159 Full incontinence of feces: Secondary | ICD-10-CM | POA: Diagnosis not present

## 2020-12-12 DIAGNOSIS — R197 Diarrhea, unspecified: Secondary | ICD-10-CM | POA: Diagnosis not present

## 2020-12-12 DIAGNOSIS — R152 Fecal urgency: Secondary | ICD-10-CM

## 2020-12-12 NOTE — Telephone Encounter (Addendum)
Patient called and would like a prescription for Trelegy sent to [facility] on [address] and [address].

## 2020-12-12 NOTE — Telephone Encounter (Signed)
Called and spoke to patient and informed her that the the medications have been sent over to the pharmacy of her choice. Patient verbalized understanding and agreed to get them today.

## 2020-12-16 LAB — GI PROFILE, STOOL, PCR

## 2020-12-16 LAB — CALPROTECTIN, FECAL: Calprotectin, Fecal: 51 ug/g (ref 0–120)

## 2020-12-17 ENCOUNTER — Encounter: Payer: Self-pay | Admitting: Gastroenterology

## 2020-12-17 LAB — PANCREATIC ELASTASE, FECAL: Pancreatic Elastase-1, Stool: 332 mcg/g

## 2020-12-18 ENCOUNTER — Encounter: Payer: HMO | Admitting: Gastroenterology

## 2020-12-19 ENCOUNTER — Other Ambulatory Visit: Payer: Self-pay

## 2020-12-19 DIAGNOSIS — K769 Liver disease, unspecified: Secondary | ICD-10-CM

## 2020-12-25 DIAGNOSIS — M25552 Pain in left hip: Secondary | ICD-10-CM | POA: Diagnosis not present

## 2020-12-30 ENCOUNTER — Other Ambulatory Visit: Payer: Self-pay

## 2020-12-30 ENCOUNTER — Emergency Department (HOSPITAL_COMMUNITY): Payer: HMO

## 2020-12-30 ENCOUNTER — Inpatient Hospital Stay (HOSPITAL_COMMUNITY)
Admission: EM | Admit: 2020-12-30 | Discharge: 2021-01-03 | DRG: 064 | Disposition: A | Discharge status: Home Health | Payer: HMO | Attending: Internal Medicine | Admitting: Internal Medicine

## 2020-12-30 ENCOUNTER — Observation Stay (HOSPITAL_COMMUNITY): Payer: HMO

## 2020-12-30 DIAGNOSIS — I110 Hypertensive heart disease with heart failure: Secondary | ICD-10-CM | POA: Diagnosis present

## 2020-12-30 DIAGNOSIS — D649 Anemia, unspecified: Secondary | ICD-10-CM | POA: Diagnosis present

## 2020-12-30 DIAGNOSIS — I10 Essential (primary) hypertension: Secondary | ICD-10-CM | POA: Diagnosis not present

## 2020-12-30 DIAGNOSIS — R4182 Altered mental status, unspecified: Secondary | ICD-10-CM | POA: Diagnosis not present

## 2020-12-30 DIAGNOSIS — F40240 Claustrophobia: Secondary | ICD-10-CM | POA: Diagnosis present

## 2020-12-30 DIAGNOSIS — E7800 Pure hypercholesterolemia, unspecified: Secondary | ICD-10-CM | POA: Diagnosis not present

## 2020-12-30 DIAGNOSIS — G629 Polyneuropathy, unspecified: Secondary | ICD-10-CM | POA: Diagnosis present

## 2020-12-30 DIAGNOSIS — I447 Left bundle-branch block, unspecified: Secondary | ICD-10-CM | POA: Diagnosis present

## 2020-12-30 DIAGNOSIS — R4701 Aphasia: Secondary | ICD-10-CM | POA: Diagnosis present

## 2020-12-30 DIAGNOSIS — Z20822 Contact with and (suspected) exposure to covid-19: Secondary | ICD-10-CM | POA: Diagnosis present

## 2020-12-30 DIAGNOSIS — E871 Hypo-osmolality and hyponatremia: Secondary | ICD-10-CM | POA: Diagnosis not present

## 2020-12-30 DIAGNOSIS — Z7982 Long term (current) use of aspirin: Secondary | ICD-10-CM

## 2020-12-30 DIAGNOSIS — Z6830 Body mass index (BMI) 30.0-30.9, adult: Secondary | ICD-10-CM

## 2020-12-30 DIAGNOSIS — C3412 Malignant neoplasm of upper lobe, left bronchus or lung: Secondary | ICD-10-CM | POA: Diagnosis not present

## 2020-12-30 DIAGNOSIS — Z7951 Long term (current) use of inhaled steroids: Secondary | ICD-10-CM

## 2020-12-30 DIAGNOSIS — Z880 Allergy status to penicillin: Secondary | ICD-10-CM

## 2020-12-30 DIAGNOSIS — I5042 Chronic combined systolic (congestive) and diastolic (congestive) heart failure: Secondary | ICD-10-CM | POA: Diagnosis present

## 2020-12-30 DIAGNOSIS — F32A Depression, unspecified: Secondary | ICD-10-CM | POA: Diagnosis present

## 2020-12-30 DIAGNOSIS — I272 Pulmonary hypertension, unspecified: Secondary | ICD-10-CM | POA: Diagnosis present

## 2020-12-30 DIAGNOSIS — G9340 Encephalopathy, unspecified: Secondary | ICD-10-CM | POA: Diagnosis not present

## 2020-12-30 DIAGNOSIS — I428 Other cardiomyopathies: Secondary | ICD-10-CM | POA: Diagnosis not present

## 2020-12-30 DIAGNOSIS — I6381 Other cerebral infarction due to occlusion or stenosis of small artery: Secondary | ICD-10-CM | POA: Diagnosis present

## 2020-12-30 DIAGNOSIS — Z8673 Personal history of transient ischemic attack (TIA), and cerebral infarction without residual deficits: Secondary | ICD-10-CM

## 2020-12-30 DIAGNOSIS — K529 Noninfective gastroenteritis and colitis, unspecified: Secondary | ICD-10-CM | POA: Diagnosis present

## 2020-12-30 DIAGNOSIS — E8779 Other fluid overload: Secondary | ICD-10-CM | POA: Diagnosis not present

## 2020-12-30 DIAGNOSIS — I6330 Cerebral infarction due to thrombosis of unspecified cerebral artery: Secondary | ICD-10-CM | POA: Diagnosis not present

## 2020-12-30 DIAGNOSIS — E669 Obesity, unspecified: Secondary | ICD-10-CM | POA: Diagnosis present

## 2020-12-30 DIAGNOSIS — I2510 Atherosclerotic heart disease of native coronary artery without angina pectoris: Secondary | ICD-10-CM | POA: Diagnosis present

## 2020-12-30 DIAGNOSIS — G9341 Metabolic encephalopathy: Secondary | ICD-10-CM | POA: Diagnosis present

## 2020-12-30 DIAGNOSIS — E785 Hyperlipidemia, unspecified: Secondary | ICD-10-CM | POA: Diagnosis present

## 2020-12-30 DIAGNOSIS — E222 Syndrome of inappropriate secretion of antidiuretic hormone: Secondary | ICD-10-CM | POA: Diagnosis present

## 2020-12-30 DIAGNOSIS — M069 Rheumatoid arthritis, unspecified: Secondary | ICD-10-CM | POA: Diagnosis not present

## 2020-12-30 DIAGNOSIS — I083 Combined rheumatic disorders of mitral, aortic and tricuspid valves: Secondary | ICD-10-CM | POA: Diagnosis present

## 2020-12-30 DIAGNOSIS — J309 Allergic rhinitis, unspecified: Secondary | ICD-10-CM | POA: Diagnosis present

## 2020-12-30 DIAGNOSIS — C3410 Malignant neoplasm of upper lobe, unspecified bronchus or lung: Secondary | ICD-10-CM | POA: Diagnosis present

## 2020-12-30 DIAGNOSIS — I639 Cerebral infarction, unspecified: Secondary | ICD-10-CM | POA: Diagnosis not present

## 2020-12-30 DIAGNOSIS — Z85118 Personal history of other malignant neoplasm of bronchus and lung: Secondary | ICD-10-CM

## 2020-12-30 DIAGNOSIS — Z8249 Family history of ischemic heart disease and other diseases of the circulatory system: Secondary | ICD-10-CM

## 2020-12-30 DIAGNOSIS — G4733 Obstructive sleep apnea (adult) (pediatric): Secondary | ICD-10-CM | POA: Diagnosis present

## 2020-12-30 DIAGNOSIS — Z91040 Latex allergy status: Secondary | ICD-10-CM

## 2020-12-30 DIAGNOSIS — Z87891 Personal history of nicotine dependence: Secondary | ICD-10-CM

## 2020-12-30 DIAGNOSIS — Z885 Allergy status to narcotic agent status: Secondary | ICD-10-CM

## 2020-12-30 DIAGNOSIS — M810 Age-related osteoporosis without current pathological fracture: Secondary | ICD-10-CM | POA: Diagnosis present

## 2020-12-30 DIAGNOSIS — R410 Disorientation, unspecified: Secondary | ICD-10-CM | POA: Diagnosis not present

## 2020-12-30 DIAGNOSIS — R531 Weakness: Secondary | ICD-10-CM | POA: Diagnosis not present

## 2020-12-30 DIAGNOSIS — Z90710 Acquired absence of both cervix and uterus: Secondary | ICD-10-CM

## 2020-12-30 DIAGNOSIS — J449 Chronic obstructive pulmonary disease, unspecified: Secondary | ICD-10-CM | POA: Diagnosis not present

## 2020-12-30 DIAGNOSIS — K219 Gastro-esophageal reflux disease without esophagitis: Secondary | ICD-10-CM | POA: Diagnosis present

## 2020-12-30 DIAGNOSIS — I6389 Other cerebral infarction: Secondary | ICD-10-CM | POA: Diagnosis not present

## 2020-12-30 DIAGNOSIS — Z8542 Personal history of malignant neoplasm of other parts of uterus: Secondary | ICD-10-CM

## 2020-12-30 DIAGNOSIS — Z888 Allergy status to other drugs, medicaments and biological substances status: Secondary | ICD-10-CM

## 2020-12-30 DIAGNOSIS — Z7902 Long term (current) use of antithrombotics/antiplatelets: Secondary | ICD-10-CM

## 2020-12-30 DIAGNOSIS — I672 Cerebral atherosclerosis: Secondary | ICD-10-CM | POA: Diagnosis not present

## 2020-12-30 DIAGNOSIS — I63012 Cerebral infarction due to thrombosis of left vertebral artery: Secondary | ICD-10-CM | POA: Diagnosis not present

## 2020-12-30 DIAGNOSIS — Z79899 Other long term (current) drug therapy: Secondary | ICD-10-CM

## 2020-12-30 LAB — CBC
HCT: 33.7 % — ABNORMAL LOW (ref 36.0–46.0)
Hemoglobin: 11 g/dL — ABNORMAL LOW (ref 12.0–15.0)
MCH: 30.3 pg (ref 26.0–34.0)
MCHC: 32.6 g/dL (ref 30.0–36.0)
MCV: 92.8 fL (ref 80.0–100.0)
Platelets: 154 10*3/uL (ref 150–400)
RBC: 3.63 MIL/uL — ABNORMAL LOW (ref 3.87–5.11)
RDW: 13.9 % (ref 11.5–15.5)
WBC: 6.2 10*3/uL (ref 4.0–10.5)
nRBC: 0 % (ref 0.0–0.2)

## 2020-12-30 LAB — PHOSPHORUS: Phosphorus: 2.4 mg/dL — ABNORMAL LOW (ref 2.5–4.6)

## 2020-12-30 LAB — BASIC METABOLIC PANEL
Anion gap: 9 (ref 5–15)
BUN: 15 mg/dL (ref 8–23)
CO2: 23 mmol/L (ref 22–32)
Calcium: 8.5 mg/dL — ABNORMAL LOW (ref 8.9–10.3)
Chloride: 93 mmol/L — ABNORMAL LOW (ref 98–111)
Creatinine, Ser: 0.68 mg/dL (ref 0.44–1.00)
GFR, Estimated: >60 mL/min (ref >60)
Glucose, Bld: 119 mg/dL — ABNORMAL HIGH (ref 70–99)
Potassium: 3.8 mmol/L (ref 3.5–5.1)
Sodium: 125 mmol/L — ABNORMAL LOW (ref 135–145)

## 2020-12-30 LAB — APTT: aPTT: 29 seconds (ref 24–36)

## 2020-12-30 LAB — COMPREHENSIVE METABOLIC PANEL
ALT: 12 U/L (ref 0–44)
AST: 25 U/L (ref 15–41)
Albumin: 3.2 g/dL — ABNORMAL LOW (ref 3.5–5.0)
Alkaline Phosphatase: 38 U/L (ref 38–126)
Anion gap: 7 (ref 5–15)
BUN: 15 mg/dL (ref 8–23)
CO2: 24 mmol/L (ref 22–32)
Calcium: 8.7 mg/dL — ABNORMAL LOW (ref 8.9–10.3)
Chloride: 96 mmol/L — ABNORMAL LOW (ref 98–111)
Creatinine, Ser: 0.75 mg/dL (ref 0.44–1.00)
GFR, Estimated: >60 mL/min (ref >60)
Glucose, Bld: 112 mg/dL — ABNORMAL HIGH (ref 70–99)
Potassium: 3.7 mmol/L (ref 3.5–5.1)
Sodium: 127 mmol/L — ABNORMAL LOW (ref 135–145)
Total Bilirubin: 0.8 mg/dL (ref 0.3–1.2)
Total Protein: 6.6 g/dL (ref 6.5–8.1)

## 2020-12-30 LAB — DIFFERENTIAL
Abs Immature Granulocytes: 0.02 10*3/uL (ref 0.00–0.07)
Basophils Absolute: 0.1 10*3/uL (ref 0.0–0.1)
Basophils Relative: 1 %
Eosinophils Absolute: 0.1 10*3/uL (ref 0.0–0.5)
Eosinophils Relative: 2 %
Immature Granulocytes: 0 %
Lymphocytes Relative: 17 %
Lymphs Abs: 1.1 10*3/uL (ref 0.7–4.0)
Monocytes Absolute: 1 10*3/uL (ref 0.1–1.0)
Monocytes Relative: 16 %
Neutro Abs: 3.9 10*3/uL (ref 1.7–7.7)
Neutrophils Relative %: 64 %

## 2020-12-30 LAB — I-STAT VENOUS BLOOD GAS, ED
Acid-base deficit: 1 mmol/L (ref 0.0–2.0)
Bicarbonate: 24.9 mmol/L (ref 20.0–28.0)
Calcium, Ion: 0.78 mmol/L — CL (ref 1.15–1.40)
HCT: 34 % — ABNORMAL LOW (ref 36.0–46.0)
Hemoglobin: 11.6 g/dL — ABNORMAL LOW (ref 12.0–15.0)
O2 Saturation: 99 %
Patient temperature: 98
Potassium: 3.6 mmol/L (ref 3.5–5.1)
Sodium: 126 mmol/L — ABNORMAL LOW (ref 135–145)
TCO2: 26 mmol/L (ref 22–32)
pCO2, Ven: 45 mmHg (ref 44.0–60.0)
pH, Ven: 7.35 (ref 7.250–7.430)
pO2, Ven: 121 mmHg — ABNORMAL HIGH (ref 32.0–45.0)

## 2020-12-30 LAB — I-STAT CHEM 8, ED
BUN: 16 mg/dL (ref 8–23)
Calcium, Ion: 0.83 mmol/L — CL (ref 1.15–1.40)
Chloride: 94 mmol/L — ABNORMAL LOW (ref 98–111)
Creatinine, Ser: 0.6 mg/dL (ref 0.44–1.00)
Glucose, Bld: 106 mg/dL — ABNORMAL HIGH (ref 70–99)
HCT: 36 % (ref 36.0–46.0)
Hemoglobin: 12.2 g/dL (ref 12.0–15.0)
Potassium: 3.6 mmol/L (ref 3.5–5.1)
Sodium: 126 mmol/L — ABNORMAL LOW (ref 135–145)
TCO2: 22 mmol/L (ref 22–32)

## 2020-12-30 LAB — CBG MONITORING, ED: Glucose-Capillary: 101 mg/dL — ABNORMAL HIGH (ref 70–99)

## 2020-12-30 LAB — PROTIME-INR
INR: 1 (ref 0.8–1.2)
Prothrombin Time: 12.8 seconds (ref 11.4–15.2)

## 2020-12-30 LAB — AMMONIA: Ammonia: 16 umol/L (ref 9–35)

## 2020-12-30 LAB — BLOOD GAS, VENOUS
Acid-base deficit: 0.3 mmol/L (ref 0.0–2.0)
Bicarbonate: 24.2 mmol/L (ref 20.0–28.0)
Drawn by: 2051
O2 Saturation: 24.6 %
Patient temperature: 37
pCO2, Ven: <44 mmHg — ABNORMAL LOW (ref 44.0–60.0)
pH, Ven: 7.377 (ref 7.250–7.430)
pO2, Ven: <31 mmHg — CL (ref 32.0–45.0)

## 2020-12-30 LAB — VITAMIN B12: Vitamin B-12: 305 pg/mL (ref 180–914)

## 2020-12-30 LAB — OSMOLALITY: Osmolality: 266 mOsm/kg — ABNORMAL LOW (ref 275–295)

## 2020-12-30 LAB — MAGNESIUM: Magnesium: 1.7 mg/dL (ref 1.7–2.4)

## 2020-12-30 LAB — TSH: TSH: 2.696 u[IU]/mL (ref 0.350–4.500)

## 2020-12-30 LAB — CORTISOL-AM, BLOOD: Cortisol - AM: 7.6 ug/dL (ref 6.7–22.6)

## 2020-12-30 MED ORDER — METOPROLOL SUCCINATE ER 25 MG PO TB24
25.0000 mg | ORAL_TABLET | Freq: Every morning | ORAL | Status: DC
  Administered 2020-12-31 – 2021-01-03 (×4): 25 mg via ORAL
  Filled 2020-12-30 (×4): qty 1

## 2020-12-30 MED ORDER — ACETAMINOPHEN 650 MG RE SUPP: 650.0000 mg | Freq: Four times a day (QID) | RECTAL | Status: DC | PRN

## 2020-12-30 MED ORDER — PANTOPRAZOLE SODIUM 40 MG PO TBEC: 40.0000 mg | DELAYED_RELEASE_TABLET | Freq: Every day | ORAL | Status: DC | PRN

## 2020-12-30 MED ORDER — FLUTICASONE FUROATE-VILANTEROL 100-25 MCG/ACT IN AEPB
1.0000 | INHALATION_SPRAY | Freq: Every day | RESPIRATORY_TRACT | Status: DC
  Administered 2020-12-31 – 2021-01-03 (×4): 1 via RESPIRATORY_TRACT
  Filled 2020-12-30: qty 28

## 2020-12-30 MED ORDER — SODIUM CHLORIDE 0.9% FLUSH: 3.0000 mL | INTRAVENOUS | Status: DC | PRN

## 2020-12-30 MED ORDER — ATORVASTATIN CALCIUM 40 MG PO TABS
40.0000 mg | ORAL_TABLET | Freq: Every day | ORAL | Status: DC
  Administered 2020-12-30 – 2021-01-03 (×5): 40 mg via ORAL
  Filled 2020-12-30 (×5): qty 1

## 2020-12-30 MED ORDER — SODIUM CHLORIDE 0.9% FLUSH
3.0000 mL | Freq: Two times a day (BID) | INTRAVENOUS | Status: DC
  Administered 2020-12-31 – 2021-01-03 (×8): 3 mL via INTRAVENOUS

## 2020-12-30 MED ORDER — SODIUM CHLORIDE 0.9% FLUSH
3.0000 mL | Freq: Once | INTRAVENOUS | Status: AC
  Administered 2020-12-30: 3 mL via INTRAVENOUS

## 2020-12-30 MED ORDER — COLESTIPOL HCL 1 G PO TABS
2.0000 g | ORAL_TABLET | Freq: Two times a day (BID) | ORAL | Status: DC
  Administered 2020-12-30 – 2021-01-03 (×8): 2 g via ORAL
  Filled 2020-12-30 (×11): qty 2

## 2020-12-30 MED ORDER — ACETAMINOPHEN 325 MG PO TABS
650.0000 mg | ORAL_TABLET | Freq: Four times a day (QID) | ORAL | Status: DC | PRN
  Administered 2020-12-31 – 2021-01-01 (×2): 650 mg via ORAL
  Filled 2020-12-30 (×2): qty 2

## 2020-12-30 MED ORDER — ALBUTEROL SULFATE (2.5 MG/3ML) 0.083% IN NEBU: 2.5000 mg | INHALATION_SOLUTION | RESPIRATORY_TRACT | Status: DC

## 2020-12-30 MED ORDER — ASPIRIN EC 81 MG PO TBEC
81.0000 mg | DELAYED_RELEASE_TABLET | Freq: Every day | ORAL | Status: DC
  Administered 2020-12-31 – 2021-01-03 (×4): 81 mg via ORAL
  Filled 2020-12-30 (×4): qty 1

## 2020-12-30 MED ORDER — PSYLLIUM 95 % PO PACK
1.0000 | PACK | Freq: Every day | ORAL | Status: DC
  Administered 2020-12-31 – 2021-01-03 (×3): 1 via ORAL
  Filled 2020-12-30 (×4): qty 1

## 2020-12-30 MED ORDER — SODIUM CHLORIDE 0.9 % IV SOLN: 250.0000 mL | INTRAVENOUS | Status: DC | PRN

## 2020-12-30 MED ORDER — IRBESARTAN 75 MG PO TABS: 75.0000 mg | ORAL_TABLET | Freq: Every day | ORAL | Status: DC

## 2020-12-30 MED ORDER — FLUTICASONE-UMECLIDIN-VILANT 100-62.5-25 MCG/ACT IN AEPB: 1.0000 | INHALATION_SPRAY | Freq: Every day | RESPIRATORY_TRACT | Status: DC

## 2020-12-30 MED ORDER — LEFLUNOMIDE 20 MG PO TABS
20.0000 mg | ORAL_TABLET | Freq: Every day | ORAL | Status: DC
  Administered 2020-12-30 – 2021-01-03 (×5): 20 mg via ORAL
  Filled 2020-12-30 (×7): qty 1

## 2020-12-30 MED ORDER — ASPIRIN 325 MG PO TABS
325.0000 mg | ORAL_TABLET | Freq: Once | ORAL | Status: AC
  Administered 2020-12-30: 325 mg via ORAL
  Filled 2020-12-30: qty 1

## 2020-12-30 MED ORDER — UMECLIDINIUM BROMIDE 62.5 MCG/ACT IN AEPB
1.0000 | INHALATION_SPRAY | Freq: Every day | RESPIRATORY_TRACT | Status: DC
  Administered 2020-12-31 – 2021-01-03 (×4): 1 via RESPIRATORY_TRACT
  Filled 2020-12-30: qty 7

## 2020-12-30 MED ORDER — FAMOTIDINE 20 MG PO TABS
40.0000 mg | ORAL_TABLET | Freq: Every evening | ORAL | Status: DC
  Administered 2020-12-30 – 2021-01-02 (×4): 40 mg via ORAL
  Filled 2020-12-30 (×4): qty 2

## 2020-12-30 MED ORDER — ENOXAPARIN SODIUM 40 MG/0.4ML IJ SOSY
40.0000 mg | PREFILLED_SYRINGE | INTRAMUSCULAR | Status: DC
  Administered 2020-12-31 – 2021-01-03 (×4): 40 mg via SUBCUTANEOUS
  Filled 2020-12-30 (×4): qty 0.4

## 2020-12-30 NOTE — Assessment & Plan Note (Addendum)
80 year old presenting with word finding difficulty, aphasia and confusion. Code stroke called, neurology consulted and signed off as stroke ruled out. Found to have sodium to 126 (136 3 months ago).  -confusion has resolved, but still has some word finding difficulty vs. Expression at times. Unsure if true confusion as she seemed to know when she said the wrong word.  -symptoms thought to be possibly from hyponatremia, neurology ruled out stroke.  -also restarted effexor 2 days ago which in rare number of cases can cause confusion-will hold this -infection not suspected, but have UA/culture pending/CXR. No increased wbc -metabolic labs pending -hyponatremia work up/correction  -SLP eval for expressive speech eval  -MRI if no improvement and with correction of sodium

## 2020-12-30 NOTE — Assessment & Plan Note (Signed)
Severe COPD -no sign of exacerbation at this time -continue trellegy

## 2020-12-30 NOTE — ED Notes (Signed)
Pt airway cleared by EDP 1534, code stroke actived by NS, pt to CT2 1540

## 2020-12-30 NOTE — Assessment & Plan Note (Signed)
Mild, non obstructive CAD at cath Oct 2018 Continue ASA and lipitor

## 2020-12-30 NOTE — ED Provider Notes (Signed)
Patient was assessed in triage, per history provided by patient and son at bedside, she took a nap at noon today feeling well.  When she woke up around 2 PM, her friend came over visited and noted that she was having difficulty with speech.  Her son at bedside confirms the patient is having word finding difficulties and difficulty speaking normally.  The patient denies a headache, denies numbness or weakness of the arms or legs.  She does appear somewhat confused on exam.  She has no obvious signs of large vessel occlusion.  Her speech is stuttering, there is some word finding difficulty.  Based on the timing of her symptoms, I have activated this as a code stroke.   Wyvonnia Dusky, MD 12/30/20 1540

## 2020-12-30 NOTE — Assessment & Plan Note (Signed)
Has been decently controlled. Continue home medication of toprol-xl 25mg , atacand 8mg .

## 2020-12-30 NOTE — ED Notes (Signed)
Upon arrival to room, pt reports mild dizziness.

## 2020-12-30 NOTE — Assessment & Plan Note (Signed)
Continue pepcid with PPI prn

## 2020-12-30 NOTE — Consult Note (Signed)
Neurology consult   CC: code stroke.  History is obtained from: ED MD.   HPI: [name] is an 80 yo female with a PMHx of COPD, depression, GERD, HLD, HTN, lung cancer s/p LUL wedge removal, OSA ,OA, RA, and uterine cancer.    Patient was seen in triage by ED provider given her c/o word finding difficulty. Code stroke was called. LKW 12 noon. CTH was negative for acute finding. Due to NIHSS of 0, with a non focal neurological exam, patient required no further imaging.   Patient was taken back to ED via stretcher. Code stroke was cancelled. ASA '325mg'$  po x 1 given.   LKW: 1200  hours TNK given?: No, no deficits and out side of window.  IR Thrombectomy?: No, no LVO suspected.  MRS: 0  NIHSS: 0  ROS: A robust ROS was unable to be performed due to emergent nature of event.   Past Medical History:  Diagnosis Date   Abnormal CT scan    Allergic rhinitis    Anemia 2015   after lung surgery   Angio-edema    Complication of anesthesia    Fentanyl allergy, "claustrophobia"   COPD (chronic obstructive pulmonary disease) ([place])    Depression    Diaphoresis 07/30/2015   Excessive sweating with little exertion   Difficult intravenous access    GERD (gastroesophageal reflux disease)    In the past   Hyperlipidemia    Hypertension    Left bundle branch block    Lung cancer ([place]) 2015   left upper lobe wedge removed-no further tx.   Neuropathy    Tingling in arms, fingers, and feet (Since 06/15/16)   Obesity    OSA (obstructive sleep apnea)    denies.   Osteoarthritis    ra also   Osteoporosis    Recurrent upper respiratory infection (URI)    Rheumatoid arthritis(714.0)    Dr. Amil Amen   SOB (shortness of breath) on exertion    Uterine cancer ([place]) 1971   tx surgical   Family History  Problem Relation Age of Onset   Lung cancer Mother    Coronary artery disease Son    Heart attack Son 53   Arthritis/Rheumatoid Maternal Grandmother    Coronary artery disease Son     Arthritis/Rheumatoid Son    Social History:  reports that she quit smoking about 33 years ago. Her smoking use included cigarettes. She has a 35.00 pack-year smoking history. She has never used smokeless tobacco. She reports that she does not drink alcohol and does not use drugs.   Prior to Admission medications   Medication Sig Start Date End Date Taking? Authorizing Provider  albuterol (VENTOLIN HFA) 108 (90 Base) MCG/ACT inhaler INHALE 2 PUFFS BY MOUTH IN EACH NOSTRIL EVERY 4 TO 6 HOURS AS NEEDED 08/31/20   Byrum, Rose Fillers, MD  AMBULATORY NON FORMULARY MEDICATION Synogut Take 2 tablet by mouth daily    [provider]  aspirin 81 MG EC tablet Take 81 mg by mouth daily.    [provider]  atorvastatin (LIPITOR) 40 MG tablet TAKE 1 TABLET(40 MG) BY MOUTH DAILY 09/04/20   [name], MD  CALCIUM-VITAMIN D PO Take 1 tablet by mouth daily.    [provider]  candesartan (ATACAND) 8 MG tablet TAKE 1 TABLET(8 MG) BY MOUTH DAILY 12/09/19   [name], MD  colestipol (COLESTID) 1 g tablet Take 2 tablets (2 g total) by mouth 2 (two) times daily.  12/11/20   Thornton Park, MD  denosumab (PROLIA) 60 MG/ML SOSY injection Inject 60 mg into the skin every 6 (six) months.    [provider]  famotidine (PEPCID) 40 MG tablet 1 tablet at bedtime.    [provider]  fluticasone (FLONASE) 50 MCG/ACT nasal spray 1-2 sprays as needed.    [provider]  Fluticasone-Umeclidin-Vilant (TRELEGY ELLIPTA) 100-62.5-25 MCG/ACT AEPB Inhale 1 puff into the lungs daily. 12/11/20   Kozlow, Donnamarie Poag, MD  lansoprazole (PREVACID) 30 MG capsule Take 1 capsule (30 mg total) by mouth every morning. 05/01/20   Kozlow, Donnamarie Poag, MD  leflunomide (ARAVA) 20 MG tablet Take 20 mg by mouth daily.    [provider]  metoprolol succinate (TOPROL-XL) 25 MG 24 hr tablet TAKE 1 TABLET(25 MG) BY MOUTH DAILY 10/10/20   [name], MD    Exam: Current vital  signs: BP 135/63   Pulse 63   Temp 98 F (36.7 C) (Oral)   Resp (!) 22   SpO2 97%   Physical Exam  Constitutional: Appears well-developed and well-nourished.  Psych: Affect appropriate to situation. Eyes: No scleral injection. HENT: No OP obstruction. Head: Normocephalic.  Cardiovascular: Normal rate and regular rhythm.  Respiratory: Effort normal.  GI: Abdomen soft.  No distension. There is no tenderness.  Skin: WDI.  Neuro: Mental Status: Patient is awake, alert, oriented to person, place, month, year, and situation. Patient is able to give a clear and coherent history. No signs of neglect. Speech/Language:  Speech is clear, fluent without dysarthria or aphasia. Repetition, naming, and comprehension intact.  Cranial Nerves: II: Visual Fields are full. Pupils are equal, round, and reactive to light.  III,IV, VI: EOMI without ptosis or diploplia.  V: Facial sensation is symmetric to light touch in V1, V2, and V3 VII: Facial movement symmetrical.  VIII: hearing is intact to voice. X: Uvula elevates symmetrically. XI: Shoulder shrug is symmetric. XII: tongue is midline without atrophy or fasciculations.  Motor: 5/5 throughout.  Sensory: Sensation is symmetric to light touch in all fours extremities. Extinction absent to DSS.  Plantars: Toes are downgoing bilaterally.  Cerebellar: No ataxia noted with FNF and HKS bilaterally.  I have reviewed labs in epic and the pertinent results are: INR   1      aPTT   29       creatinine  0.6   Latest Reference Range & Units 12/30/20 15:40  Sodium 135 - 145 mmol/L 126 (L)    Latest Reference Range & Units 12/30/20 15:40  Calcium Ionized 1.15 - 1.40 mmol/L 0.83 (LL)  (LL): Data is critically low  MD reviewed the images obtained:  NCT head  No acute finding. Chronic small-vessel ischemic changes of the cerebral hemispheric white matter. ASPECTS is 10.  Assessment: 80 yo female with stroke risk factors of HTN, HLD, and OSA.  Code stroke was called due to word finding difficulties. NIHSS was zero without aphasia or dysarthria noted in CT suite.  No LVO was suspected. Her symptoms are likely due to low Na+. Patient given an ASA '325mg'$  and no further stroke workup is needed.   Impression Hyponatremia. Severe hypocalcemia.  Plan: -no stroke workup needed.  -Continue to correct electrolyte derangements.  Patient seen by Clance Boll, MSN, APN-BC, nurse practitioner and by MD. Note/plan to be edited by MD as needed.  Pager: [phone]   The patient was seen and examined with Clance Boll NP. We reviewed the chart and imaging.  Agree with plan as outlined in the note above.

## 2020-12-30 NOTE — Assessment & Plan Note (Signed)
Followed outpatient with no history of recurrence on repeat imaging.

## 2020-12-30 NOTE — ED Provider Notes (Signed)
[facility] EMERGENCY DEPARTMENT Provider Note   CSN: [id number] Arrival date & time: 12/30/20  1523  An emergency department physician performed an initial assessment on this suspected stroke patient at 85.  History Chief Complaint  Patient presents with   Code Stroke    Taylor Berry is a 80 y.o. female who presents the emergency department for evaluation of expressive aphasia and altered mental status.  Patient apparently took a nap around noon today and awoke at 2 PM with expressive aphasia.  She was then brought to the emergency department as a stroke alert.  On arrival here she is having word finding difficulty and mild encephalopathy but is denying any chest pain, shortness of breath, abdominal pain, nausea, vomiting or other systemic symptoms.  HPI     Past Medical History:  Diagnosis Date   Abnormal CT scan    Allergic rhinitis    Anemia 2015   after lung surgery   Angio-edema    Complication of anesthesia    Fentanyl allergy, "claustrophobia"   COPD (chronic obstructive pulmonary disease) ([place])    Depression    Diaphoresis 07/30/2015   Excessive sweating with little exertion   Difficult intravenous access    GERD (gastroesophageal reflux disease)    In the past   Hyperlipidemia    Hypertension    Left bundle branch block    Lung cancer ([place]) 2015   left upper lobe wedge removed-no further tx.   Neuropathy    Tingling in arms, fingers, and feet (Since 06/15/16)   Obesity    OSA (obstructive sleep apnea)    denies.   Osteoarthritis    ra also   Osteoporosis    Recurrent upper respiratory infection (URI)    Rheumatoid arthritis(714.0)    Dr. Amil Amen   SOB (shortness of breath) on exertion    Uterine cancer [facility]) 1971   tx surgical    Patient Active Problem List   Diagnosis Date Noted   Encephalopathy acute/expressive aphasia  12/30/2020   Hyponatremia 12/30/2020   Acute on chronic combined systolic and diastolic CHF  (congestive heart failure) ([place]) 04/28/2019   non obstructive CAD 04/28/2019   Edema 04/14/2019   Abnormal CT of the chest 03/22/2018   Acid reflux 07/13/2017   Affective psychosis ([place]) 07/13/2017   Alcohol dependence in remission ([place]) 07/13/2017   BD (Bowen's disease) 07/13/2017   History of colon polyps 07/13/2017   Rheumatoid arthritis ([place]) 07/13/2017   Chronic bronchitis ([place]) 07/13/2017   Secondary pulmonary hypertension 07/13/2017   Obstructive apnea 07/13/2017   Age related osteoporosis 07/13/2017   Non-ischemic cardiomyopathy ([place])    Hyperhidrosis 06/18/2016   Anemia in neoplastic disease 05/02/2013   Allergic rhinitis 04/29/2013   Depression 04/29/2013   Lung cancer, left upper lobe 02/03/2013   Pulmonary infiltrates 01/05/2013   Mild pulmonary hypertension ([place]) 02/17/2011   OSA (obstructive sleep apnea) 02/17/2011   Dyspnea on exertion 11/22/2010   COPD (chronic obstructive pulmonary disease) ([place]) 07/20/2009   Carcinoma in situ of cervix uteri 06/26/2009   Pure hypercholesterolemia 06/26/2009   OBESITY 06/26/2009   UNSPECIFIED ANEMIA 06/26/2009   Essential (primary) hypertension 06/26/2009   ARTHRITIS, RHEUMATOID 06/26/2009   OSTEOPOROSIS 06/26/2009   Cough 06/26/2009    Past Surgical History:  Procedure Laterality Date   ABDOMINAL HYSTERECTOMY     in situ carcinoma partial   CARDIAC CATHETERIZATION  01/27/11   minor non-obs CAD, NL EF, mild pulm HTN   CATARACT EXTRACTION, BILATERAL  last done 12'16-   COLONOSCOPY W/ BIOPSIES AND POLYPECTOMY     COLONOSCOPY WITH PROPOFOL N/A 02/13/2015   Procedure: COLONOSCOPY WITH PROPOFOL;  Surgeon: Laurence Spates, MD;  Location: [facility];  Service: Endoscopy;  Laterality: N/A;   ESOPHAGOGASTRODUODENOSCOPY (EGD) WITH PROPOFOL N/A 09/30/2016   Procedure: ESOPHAGOGASTRODUODENOSCOPY (EGD) WITH PROPOFOL;  Surgeon: Laurence Spates, MD;  Location: [facility];  Service: Endoscopy;  Laterality: N/A;   LYMPH NODE DISSECTION  Left 04/12/2013   Procedure: LYMPH NODE DISSECTION;  Surgeon: [name], MD;  Location: [facility];  Service: Thoracic;  Laterality: Left;   RIGHT/LEFT HEART CATH AND CORONARY ANGIOGRAPHY N/A 10/08/2016   Procedure: RIGHT/LEFT HEART CATH AND CORONARY ANGIOGRAPHY;  Surgeon: Burnell Blanks, MD;  Location: [facility];  Service: Cardiovascular;  Laterality: N/A;   TONSILLECTOMY     VESICOVAGINAL FISTULA CLOSURE W/ TAH  1971   VIDEO ASSISTED THORACOSCOPY (VATS)/THOROCOTOMY Left 04/12/2013   Procedure: VIDEO ASSISTED THORACOSCOPY (VATS)/THOROCOTOMY, WITH LEFT UPPER LOBE WEDGE RESECTION, CHEST WALL BIOPSY ;  Surgeon: [name], MD;  Location: [facility];  Service: Thoracic;  Laterality: Left;   VIDEO BRONCHOSCOPY N/A 04/12/2013   Procedure: VIDEO BRONCHOSCOPY;  Surgeon: [name], MD;  Location: [facility];  Service: Thoracic;  Laterality: N/A;     OB History   No obstetric history on file.     Family History  Problem Relation Age of Onset   Lung cancer Mother    Coronary artery disease Son    Heart attack Son 102   Arthritis/Rheumatoid Maternal Grandmother    Coronary artery disease Son    Arthritis/Rheumatoid Son     Social History   Tobacco Use   Smoking status: Former    Packs/day: 1.00    Years: 35.00    Pack years: 35.00    Types: Cigarettes    Quit date: 01/07/1988    Years since quitting: 33.0   Smokeless tobacco: Never  Vaping Use   Vaping Use: Never used  Substance Use Topics   Alcohol use: No    Comment: not since 1988   Drug use: No    Home Medications Prior to Admission medications   Medication Sig Start Date End Date Taking? Authorizing Provider  albuterol (VENTOLIN HFA) 108 (90 Base) MCG/ACT inhaler INHALE 2 PUFFS BY MOUTH IN EACH NOSTRIL EVERY 4 TO 6 HOURS AS NEEDED Patient taking differently: Inhale 2 puffs into the lungs See admin instructions. Inhale 2 puffs into the lungs every 4-6 hours as needed for wheezing or shortness of breath  08/31/20  Yes Byrum, Rose Fillers, MD  AMBULATORY NON FORMULARY MEDICATION Take 1 capsule by mouth See admin instructions. Synogut probiotic capsules- Take 1 capsule by mouth in the morning and at bedtime   Yes [provider]  aspirin 81 MG EC tablet Take 81 mg by mouth daily.   Yes [provider]  atorvastatin (LIPITOR) 40 MG tablet TAKE 1 TABLET(40 MG) BY MOUTH DAILY Patient taking differently: Take 40 mg by mouth daily. 09/04/20  Yes [name], MD  CALCIUM-VITAMIN D PO Take 1 tablet by mouth daily.   Yes [provider]  candesartan (ATACAND) 8 MG tablet TAKE 1 TABLET(8 MG) BY MOUTH DAILY Patient taking differently: Take 8 mg by mouth in the morning. 12/09/19  Yes [name], MD  colestipol (COLESTID) 1 g tablet Take 2 tablets (2 g total) by mouth 2 (two) times daily. 12/11/20  Yes Thornton Park, MD  denosumab (PROLIA) 60  MG/ML SOSY injection Inject 60 mg into the skin every 6 (six) months.   Yes [provider]  famotidine (PEPCID) 40 MG tablet Take 40 mg by mouth every evening.   Yes [provider]  fluticasone (FLONASE) 50 MCG/ACT nasal spray Place 1-2 sprays into both nostrils daily as needed for allergies or rhinitis.   Yes [provider]  Fluticasone-Umeclidin-Vilant (TRELEGY ELLIPTA) 100-62.5-25 MCG/ACT AEPB Inhale 1 puff into the lungs daily. 12/11/20  Yes Kozlow, Donnamarie Poag, MD  lansoprazole (PREVACID) 30 MG capsule Take 1 capsule (30 mg total) by mouth every morning. Patient taking differently: Take 30 mg by mouth daily as needed (for heartburn). 05/01/20  Yes Kozlow, Donnamarie Poag, MD  leflunomide (ARAVA) 20 MG tablet Take 20 mg by mouth daily.   Yes [provider]  METAMUCIL FIBER PO Take by mouth See admin instructions. Mix 1 tablespoonful of powder into 4-8 ounces of desired beverage and drink once a day   Yes [provider]  metoprolol succinate (TOPROL-XL) 25 MG 24 hr tablet TAKE 1 TABLET(25 MG) BY MOUTH  DAILY Patient taking differently: Take 25 mg by mouth in the morning. 10/10/20  Yes [name], MD  predniSONE (DELTASONE) 5 MG tablet Take 5 mg by mouth daily as needed (AS DIRECTED- for R.A. flares).   Yes [provider]  venlafaxine XR (EFFEXOR-XR) 75 MG 24 hr capsule Take 75 mg by mouth daily with breakfast.   Yes [provider]    Allergies    Latex, Ace inhibitors, Angiotensin receptor blockers, Fentanyl, Hydrochlorothiazide, Hydroxychloroquine, and Penicillins  Review of Systems   Review of Systems  Constitutional:  Negative for chills and fever.  HENT:  Negative for ear pain and sore throat.   Eyes:  Negative for pain and visual disturbance.  Respiratory:  Negative for cough and shortness of breath.   Cardiovascular:  Negative for chest pain and palpitations.  Gastrointestinal:  Negative for abdominal pain and vomiting.  Genitourinary:  Negative for dysuria and hematuria.  Musculoskeletal:  Negative for arthralgias and back pain.  Skin:  Negative for color change and rash.  Neurological:  Positive for speech difficulty. Negative for seizures and syncope.  All other systems reviewed and are negative.  Physical Exam Updated Vital Signs BP (!) 149/75    Pulse 62    Temp 98 F (36.7 C) (Oral)    Resp (!) 21    SpO2 98%   Physical Exam Vitals and nursing note reviewed.  Constitutional:      General: She is not in acute distress.    Appearance: She is well-developed.  HENT:     Head: Normocephalic and atraumatic.  Eyes:     Conjunctiva/sclera: Conjunctivae normal.  Cardiovascular:     Rate and Rhythm: Normal rate and regular rhythm.     Heart sounds: No murmur heard. Pulmonary:     Effort: Pulmonary effort is normal. No respiratory distress.     Breath sounds: Normal breath sounds.  Abdominal:     Palpations: Abdomen is soft.     Tenderness: There is no abdominal tenderness.  Musculoskeletal:        General: No swelling.     Cervical back:  Neck supple.  Skin:    General: Skin is warm and dry.     Capillary Refill: Capillary refill takes less than 2 seconds.  Neurological:     Mental Status: She is alert.     Cranial Nerves: No cranial nerve deficit.  Sensory: No sensory deficit.     Motor: No weakness.  Psychiatric:        Mood and Affect: Mood normal.    ED Results / Procedures / Treatments   Labs (all labs ordered are listed, but only abnormal results are displayed) Labs Reviewed  CBC - Abnormal; Notable for the following components:      Result Value   RBC 3.63 (*)    Hemoglobin 11.0 (*)    HCT 33.7 (*)    All other components within normal limits  COMPREHENSIVE METABOLIC PANEL - Abnormal; Notable for the following components:   Sodium 127 (*)    Chloride 96 (*)    Glucose, Bld 112 (*)    Calcium 8.7 (*)    Albumin 3.2 (*)    All other components within normal limits  CBG MONITORING, ED - Abnormal; Notable for the following components:   Glucose-Capillary 101 (*)    All other components within normal limits  I-STAT CHEM 8, ED - Abnormal; Notable for the following components:   Sodium 126 (*)    Chloride 94 (*)    Glucose, Bld 106 (*)    Calcium, Ion 0.83 (*)    All other components within normal limits  I-STAT VENOUS BLOOD GAS, ED - Abnormal; Notable for the following components:   pO2, Ven 121.0 (*)    Sodium 126 (*)    Calcium, Ion 0.78 (*)    HCT 34.0 (*)    Hemoglobin 11.6 (*)    All other components within normal limits  URINE CULTURE  PROTIME-INR  APTT  DIFFERENTIAL  BLOOD GAS, VENOUS  SODIUM, URINE, RANDOM  OSMOLALITY, URINE  TSH  AMMONIA  URINALYSIS, ROUTINE W REFLEX MICROSCOPIC  VITAMIN B12  OSMOLALITY  CORTISOL-AM, BLOOD  BASIC METABOLIC PANEL  PTH, INTACT AND CALCIUM  PHOSPHORUS  MAGNESIUM  CBG MONITORING, ED    EKG None  Radiology CT HEAD CODE STROKE WO CONTRAST  Result Date: 12/30/2020 CLINICAL DATA:  Code stroke.  Neuro deficit, acute, stroke suspected EXAM:  CT HEAD WITHOUT CONTRAST TECHNIQUE: Contiguous axial images were obtained from the base of the skull through the vertex without intravenous contrast. COMPARISON:  MRI 03/11/2008 FINDINGS: Brain: No focal abnormality seen affecting the brainstem or cerebellum. Cerebral hemispheres show age related volume loss with moderate chronic small-vessel ischemic changes of the white matter. No sign of acute infarction, mass lesion, hemorrhage, hydrocephalus or extra-axial collection. Vascular: There is atherosclerotic calcification of the major vessels at the base of the brain. Skull: Negative Sinuses/Orbits: Clear/normal Other: None ASPECTS ([place] Stroke Program Early CT Score) - Ganglionic level infarction (caudate, lentiform nuclei, internal capsule, insula, M1-M3 cortex): 7 - Supraganglionic infarction (M4-M6 cortex): 3 Total score (0-10 with 10 being normal): 10 IMPRESSION: 1. No acute finding. Chronic small-vessel ischemic changes of the cerebral hemispheric white matter. 2. ASPECTS is 10 3. These results were communicated to Dr. Theda Sers at 3:53 pm on 12/30/2020 by text page via the [facility] messaging system. Electronically Signed   By: Nelson Chimes M.D.   On: 12/30/2020 15:53    Procedures Procedures   Medications Ordered in ED Medications  aspirin EC tablet 81 mg (has no administration in time range)  leflunomide (ARAVA) tablet 20 mg (has no administration in time range)  atorvastatin (LIPITOR) tablet 40 mg (has no administration in time range)  colestipol (COLESTID) tablet 2 g (has no administration in time range)  metoprolol succinate (TOPROL-XL) 24 hr tablet 25 mg (has no administration in time  range)  famotidine (PEPCID) tablet 40 mg (has no administration in time range)  pantoprazole (PROTONIX) EC tablet 40 mg (has no administration in time range)  Psyllium 51.7 % PACK 1 Dose (has no administration in time range)  albuterol (VENTOLIN HFA) 108 (90 Base) MCG/ACT inhaler 2 puff (has no administration in  time range)  Fluticasone-Umeclidin-Vilant 100-62.5-25 MCG/ACT AEPB 1 puff (has no administration in time range)  sodium chloride flush (NS) 0.9 % injection 3 mL (3 mLs Intravenous Given 12/30/20 1703)  aspirin tablet 325 mg (325 mg Oral Given 12/30/20 1702)    ED Course  I have reviewed the triage vital signs and the nursing notes.  Pertinent labs & imaging results that were available during my care of the patient were reviewed by me and considered in my medical decision making (see chart for details).    MDM Rules/Calculators/A&P                          Patient seen emergency department for evaluation of word finding difficulty and encephalopathy.  Physical exam largely unremarkable.  Initial NIH is 0.  Stroke alert head CT negative.  Neurology evaluated the patient and believes that the patient is likely encephalopathic and is not suffering from a stroke.  Laboratory evaluation reveals an hyponatremia to 126 with a significant ionized hypocalcemia to 0.83.  Anemia to 11.0 with an MCV of 92.8.  Patient presentation likely hyponatremia versus hypocalcemia induced encephalopathy.  Patient then admitted to medicine for persistent encephalopathy and hyponatremia.   Final Clinical Impression(s) / ED Diagnoses Final diagnoses:  Hyponatremia    Rx / DC Orders ED Discharge Orders     None        Afifa Truax, MD 12/30/20 2025

## 2020-12-30 NOTE — Assessment & Plan Note (Signed)
Total calcium mildly depleted, ionized calcium at .83 -check mag, phosphorous and PTH  -albumin only mildly decreased  -repeat labs

## 2020-12-30 NOTE — Assessment & Plan Note (Signed)
-  found to have sodium of 127 and it was 136 three months ago with possible confusion/speech changes -she appears to be euvolemic and nearly back to baseline -will check urine studies, TSH, cortisol in AM.  -UA pending. Intake/output -just started back effexor which could be contributing, holding this  -repeat bmp tonight  -frequent neuro checks

## 2020-12-30 NOTE — ED Triage Notes (Signed)
Pt states she went down for a nap approx 1200, woke up from approx 1400, friend came to visit & noted she, "couldn't talk right." Son called over, concern for stroke. No hx dementia, no focal deficit noted in triage. EDP to triage

## 2020-12-30 NOTE — H&P (Signed)
History and Physical    Taylor Berry PPJ:[id number] DOB: March 02, 1940 DOA: 12/30/2020  PCP: Kelton Pillar, MD Consultants:  cardiology: Dr. Stanford Breed, pulmonology: Dr. Lamonte Sakai, rheumatology: Dr. Amil Amen, GI: Dr. Tarri Glenn, allergist: Dr. Carmelina Peal Patient coming from:  Home - lives alone   Chief Complaint: speech abnormality.   HPI: Taylor Berry is a 80 y.o. female with medical history significant of severe COPD, depression, lung cancer s/p LUL wedge removal, HTN, HLD, obesity with untreated OSA, RA, GERD, nonischemic cardiomyopathy, hx of uterine cancer who presented to ED with expressive aphasia. Woke up from nap around 1:30-2:00pm and a friend came over and said she didn't know how to talk and she couldn't answer a question. She would say one or two words and had to really think about it. They called her son who came to her house and states she was oriented and had no facial droop, drooling or weakness, but she wasn't acting normal and her speech was still unintelligible and still having a hard time finding words. Her son thought she was confused. She may have had more word finding difficulty as she said she knew the word, but couldn't get it out right. Symptoms have significantly improved.   She has had no fever/chills, she has had some dizziness, no chest pain or palpitations. She is chronically short of breath and at her baseline. No cough, no stomach pain, N/V, hx of diarrhea followed by GI, no dysuria or increased urgency or frequency, no leg swelling. She has had left sided hip pain that she just started PT for. Has been seen by her rheumatology for this.   She does not smoke or drink. Does not know family history, she was adopted.   Of note she just started back on her effexor about 2 days ago.   ED Course: vitals: afebrile, bp: 147/88, HR: 71, RR: 16, oxygen: 99% RA Pertinent labs: hgb: 11.0, sodium: 127,  CTH: no acute finding In Ed given 325mg  ASA. Neurology consulted for code  stroke and TRH was asked to admit.   Review of Systems: As per HPI; otherwise review of systems reviewed and negative.   Ambulatory Status:  Ambulates without assistance   Past Medical History:  Diagnosis Date   Abnormal CT scan    Allergic rhinitis    Anemia 2015   after lung surgery   Angio-edema    Complication of anesthesia    Fentanyl allergy, "claustrophobia"   COPD (chronic obstructive pulmonary disease) ([place])    Depression    Diaphoresis 07/30/2015   Excessive sweating with little exertion   Difficult intravenous access    GERD (gastroesophageal reflux disease)    In the past   Hyperlipidemia    Hypertension    Left bundle branch block    Lung cancer ([place]) 2015   left upper lobe wedge removed-no further tx.   Neuropathy    Tingling in arms, fingers, and feet (Since 06/15/16)   Obesity    OSA (obstructive sleep apnea)    denies.   Osteoarthritis    ra also   Osteoporosis    Recurrent upper respiratory infection (URI)    Rheumatoid arthritis(714.0)    Dr. Amil Amen   SOB (shortness of breath) on exertion    Uterine cancer ([place]) 1971   tx surgical    Past Surgical History:  Procedure Laterality Date   ABDOMINAL HYSTERECTOMY     in situ carcinoma partial   CARDIAC CATHETERIZATION  01/27/11   minor non-obs CAD, NL EF,  mild pulm HTN   CATARACT EXTRACTION, BILATERAL     last done 12'16-   COLONOSCOPY W/ BIOPSIES AND POLYPECTOMY     COLONOSCOPY WITH PROPOFOL N/A 02/13/2015   Procedure: COLONOSCOPY WITH PROPOFOL;  Surgeon: Laurence Spates, MD;  Location: [facility];  Service: Endoscopy;  Laterality: N/A;   ESOPHAGOGASTRODUODENOSCOPY (EGD) WITH PROPOFOL N/A 09/30/2016   Procedure: ESOPHAGOGASTRODUODENOSCOPY (EGD) WITH PROPOFOL;  Surgeon: Laurence Spates, MD;  Location: [facility];  Service: Endoscopy;  Laterality: N/A;   LYMPH NODE DISSECTION Left 04/12/2013   Procedure: LYMPH NODE DISSECTION;  Surgeon: [name], MD;  Location: [facility];  Service: Thoracic;   Laterality: Left;   RIGHT/LEFT HEART CATH AND CORONARY ANGIOGRAPHY N/A 10/08/2016   Procedure: RIGHT/LEFT HEART CATH AND CORONARY ANGIOGRAPHY;  Surgeon: Burnell Blanks, MD;  Location: [facility];  Service: Cardiovascular;  Laterality: N/A;   TONSILLECTOMY     VESICOVAGINAL FISTULA CLOSURE W/ TAH  1971   VIDEO ASSISTED THORACOSCOPY (VATS)/THOROCOTOMY Left 04/12/2013   Procedure: VIDEO ASSISTED THORACOSCOPY (VATS)/THOROCOTOMY, WITH LEFT UPPER LOBE WEDGE RESECTION, CHEST WALL BIOPSY ;  Surgeon: [name], MD;  Location: [facility];  Service: Thoracic;  Laterality: Left;   VIDEO BRONCHOSCOPY N/A 04/12/2013   Procedure: VIDEO BRONCHOSCOPY;  Surgeon: [name], MD;  Location: [facility] OR;  Service: Thoracic;  Laterality: N/A;    Social History   Socioeconomic History   Marital status: Widowed    Spouse name: Not on file   Number of children: 2   Years of education: College   Highest education level: Master's degree (e.g., MA, MS, MEng, MEd, MSW, MBA)  Occupational History   Occupation: Nurse, adult: [employer]  Tobacco Use   Smoking status: Former    Packs/day: 1.00    Years: 35.00    Pack years: 35.00    Types: Cigarettes    Quit date: 01/07/1988    Years since quitting: 33.0   Smokeless tobacco: Never  Vaping Use   Vaping Use: Never used  Substance and Sexual Activity   Alcohol use: No    Comment: not since 1988   Drug use: No   Sexual activity: Never  Other Topics Concern   Not on file  Social History Narrative   Not on file   Social Determinants of Health   Financial Resource Strain: Not on file  Food Insecurity: Not on file  Transportation Needs: Not on file  Physical Activity: Not on file  Stress: Not on file  Social Connections: Not on file  Intimate Partner Violence: Not on file    Allergies  Allergen Reactions   Latex     REACTION: itching   Ace Inhibitors     REACTION: cough   Fentanyl     Hallucination "I go crazy"      Hydrochlorothiazide     REACTION: decreased sodium   Penicillins Other (See Comments)    REACTION: intolerant due to yeast infection Has patient had a PCN reaction causing immediate rash, facial/tongue/throat swelling, SOB or lightheadedness with hypotension:No Has patient had a PCN reaction causing severe rash involving mucus membranes or skin necrosis: No Has patient had a PCN reaction that required hospitalization No Has patient had a PCN reaction occurring within the last 10 years: Yes If all of the above answers are "NO", then may proceed with Cephalosporin use.     Family History  Problem Relation Age of Onset   Lung cancer Mother    Coronary artery disease Son  Heart attack Son 73   Arthritis/Rheumatoid Maternal Grandmother    Coronary artery disease Son    Arthritis/Rheumatoid Son     Prior to Admission medications   Medication Sig Start Date End Date Taking? Authorizing Provider  albuterol (VENTOLIN HFA) 108 (90 Base) MCG/ACT inhaler INHALE 2 PUFFS BY MOUTH IN EACH NOSTRIL EVERY 4 TO 6 HOURS AS NEEDED 08/31/20   Byrum, Rose Fillers, MD  AMBULATORY NON FORMULARY MEDICATION Synogut Take 2 tablet by mouth daily    [provider]  aspirin 81 MG EC tablet Take 81 mg by mouth daily.    [provider]  atorvastatin (LIPITOR) 40 MG tablet TAKE 1 TABLET(40 MG) BY MOUTH DAILY 09/04/20   [name], MD  CALCIUM-VITAMIN D PO Take 1 tablet by mouth daily.    [provider]  candesartan (ATACAND) 8 MG tablet TAKE 1 TABLET(8 MG) BY MOUTH DAILY 12/09/19   [name], MD  colestipol (COLESTID) 1 g tablet Take 2 tablets (2 g total) by mouth 2 (two) times daily. 12/11/20   Thornton Park, MD  denosumab (PROLIA) 60 MG/ML SOSY injection Inject 60 mg into the skin every 6 (six) months.    [provider]  famotidine (PEPCID) 40 MG tablet 1 tablet at bedtime.    [provider]  fluticasone (FLONASE) 50 MCG/ACT nasal spray 1-2 sprays as  needed.    [provider]  Fluticasone-Umeclidin-Vilant (TRELEGY ELLIPTA) 100-62.5-25 MCG/ACT AEPB Inhale 1 puff into the lungs daily. 12/11/20   Kozlow, Donnamarie Poag, MD  lansoprazole (PREVACID) 30 MG capsule Take 1 capsule (30 mg total) by mouth every morning. 05/01/20   Kozlow, Donnamarie Poag, MD  leflunomide (ARAVA) 20 MG tablet Take 20 mg by mouth daily.    [provider]  metoprolol succinate (TOPROL-XL) 25 MG 24 hr tablet TAKE 1 TABLET(25 MG) BY MOUTH DAILY 10/10/20   [name], MD    Physical Exam: Vitals:   12/30/20 1700 12/30/20 1715 12/30/20 1745 12/30/20 1800  BP: (!) 151/73 (!) 154/82 (!) 146/77 (!) 149/75  Pulse: 60 (!) 59 (!) 59 62  Resp: (!) 22 20 17  (!) 21  Temp:      TempSrc:      SpO2: 98% 98% 97% 98%     General:  Appears calm and comfortable and is in NAD Eyes:  PERRL, EOMI, normal lids, iris ENT:  grossly normal hearing, lips & tongue, mmm; appropriate dentition Neck:  no LAD, masses or thyromegaly; no carotid bruits Cardiovascular:  RRR, no m/r/g. No LE edema.  Respiratory:   left lower/mid lobe with velcro like sounds, otherwise normal exam.  Normal respiratory effort. Abdomen:  soft, NT, ND, NABS Back:   normal alignment, no CVAT Skin:  no rash or induration seen on limited exam Musculoskeletal:  grossly normal tone BUE/BLE, good ROM, no bony abnormality Lower extremity:  No LE edema.  Limited foot exam with no ulcerations.  2+ distal pulses. Psychiatric:  grossly normal mood and affect, speech overall fluent and appropriate, but still has word finding issues at times. , AOx3 Neurologic:  CN 2-12 grossly intact, moves all extremities in coordinated fashion, sensation intact. HTS intact bilaterally. Negative pronator drift. Gait deferred.     Radiological Exams on Admission: Independently reviewed - see discussion in A/P where applicable  CT HEAD CODE STROKE WO CONTRAST  Result Date: 12/30/2020 CLINICAL DATA:  Code stroke.  Neuro deficit,  acute, stroke suspected EXAM: CT HEAD WITHOUT CONTRAST TECHNIQUE: Contiguous axial  images were obtained from the base of the skull through the vertex without intravenous contrast. COMPARISON:  MRI 03/11/2008 FINDINGS: Brain: No focal abnormality seen affecting the brainstem or cerebellum. Cerebral hemispheres show age related volume loss with moderate chronic small-vessel ischemic changes of the white matter. No sign of acute infarction, mass lesion, hemorrhage, hydrocephalus or extra-axial collection. Vascular: There is atherosclerotic calcification of the major vessels at the base of the brain. Skull: Negative Sinuses/Orbits: Clear/normal Other: None ASPECTS ([place] Stroke Program Early CT Score) - Ganglionic level infarction (caudate, lentiform nuclei, internal capsule, insula, M1-M3 cortex): 7 - Supraganglionic infarction (M4-M6 cortex): 3 Total score (0-10 with 10 being normal): 10 IMPRESSION: 1. No acute finding. Chronic small-vessel ischemic changes of the cerebral hemispheric white matter. 2. ASPECTS is 10 3. These results were communicated to Dr. Theda Sers at 3:53 pm on 12/30/2020 by text page via the [facility] messaging system. Electronically Signed   By: Nelson Chimes M.D.   On: 12/30/2020 15:53    EKG: Independently reviewed.  NSR with rate 62, LBBB; nonspecific ST changes with no evidence of acute ischemia. LBBB on previous ekg.    Labs on Admission: I have personally reviewed the available labs and imaging studies at the time of the admission.  Pertinent labs:  hgb: 11.0,  sodium: 127,   Assessment/Plan Encephalopathy acute/expressive aphasia  80 year old presenting with word finding difficulty, aphasia and confusion. Code stroke called, neurology consulted and signed off as stroke ruled out. Found to have sodium to 126 (136 3 months ago).  -confusion has resolved, but still has some word finding difficulty vs. Expression at times. Unsure if true confusion as she seemed to know when she said  the wrong word.  -symptoms thought to be possibly from hyponatremia, neurology ruled out stroke.  -also restarted effexor 2 days ago which in rare number of cases can cause confusion-will hold this -infection not suspected, but have UA/culture pending/CXR. No increased wbc or fever.  -metabolic labs pending -hyponatremia work up/correction  -SLP eval for expressive speech eval  -MRI if no improvement and with correction of sodium   Hyponatremia -found to have sodium of 127 and it was 136 three months ago with possible confusion/speech changes -she appears to be euvolemic and nearly back to baseline. Will get work up back to determine treatment.  -will check urine studies, TSH, cortisol in AM.  -UA pending. Intake/output -just started back effexor which could be contributing, holding this  -repeat bmp tonight  -frequent neuro checks   Hypocalcemia Total calcium mildly depleted, ionized calcium at .83 -check mag, phosphorous and PTH  -albumin only mildly decreased  -repeat labs    Anemia Hgb 11.0, has been normal. Has history of anemia Will monitor for now.   non obstructive CAD Mild, non obstructive CAD at cath Oct 2018 Continue ASA and lipitor   Essential (primary) hypertension Has been decently controlled. Continue home medication of toprol-xl 25mg , holding ARB with hx of allergy to ARBS. We do not have atacand  on formulary   COPD (chronic obstructive pulmonary disease) ([place]) Severe COPD -no sign of exacerbation at this time -continue trellegy   Non-ischemic cardiomyopathy/diastolic and systolic CHF Echo: 3/14: EF: 50-55% with grade 1 diastolic dysfunction Euvolemic, intake/output  Continue toprol, holding her cadesartan as unsure if allergy to avapro with ARB allergy   Rheumatoid arthritis ([place]) Continue arava   GERD (gastroesophageal reflux disease) Continue pepcid with PPI prn   Lung cancer, left upper lobe Followed outpatient with no  history of recurrence on  repeat imaging.   OSA (obstructive sleep apnea) Untreated.    There is no height or weight on file to calculate BMI.    Level of care:  DVT prophylaxis:  Lovenox  Code Status:  Full - confirmed with patient/family Family Communication: son at bedside: paul  Disposition Plan:  The patient is from: home  Anticipated d/c is to: home   Patient placed in observation as anticipate less than 2 midnight stay. Requires hospitalization for work up of hyponatremia, confusion/aphasia, close monitoring as she is not safe to return home at this time.    Patient is currently: stable Consults called: neurology by edp.signed off.   Admission status:  observation    Orma Flaming MD [facility]   How to contact the [facility] Attending or Consulting provider [place] or covering provider during after hours [place], for this patient?  Check the care team in [facility] and look for a) attending/consulting [facility] provider listed and b) the [facility] team listed Log into [email or web address] and use [place]'s universal password to access. If you do not have the password, please contact the hospital operator. Locate the [facility] provider you are looking for under [facility] and page to a number that you can be directly reached. If you still have difficulty reaching the provider, please page the [facility] (Director on Call) for the Hospitalists listed on amion for assistance.   12/30/2020, 6:03 PM

## 2020-12-30 NOTE — Assessment & Plan Note (Signed)
Continue arava

## 2020-12-30 NOTE — Assessment & Plan Note (Signed)
Untreated  

## 2020-12-30 NOTE — ED Notes (Signed)
Attempt to call report, nurse not available at this time

## 2020-12-30 NOTE — ED Notes (Signed)
Returned to room from CT, Code stroke cancelled per neurology, state no need for CTA or MRI.  Pt speaking at baseline.

## 2020-12-30 NOTE — Assessment & Plan Note (Signed)
Echo: 9/21: EF: 50-55% with grade 1 diastolic dysfunction Euvolemic, intake/output  Continue toprol, holding her cadesartan as unsure if allergy to avapro with ARB allergy

## 2020-12-31 ENCOUNTER — Other Ambulatory Visit: Payer: Self-pay

## 2020-12-31 DIAGNOSIS — I428 Other cardiomyopathies: Secondary | ICD-10-CM

## 2020-12-31 DIAGNOSIS — F40240 Claustrophobia: Secondary | ICD-10-CM | POA: Diagnosis present

## 2020-12-31 DIAGNOSIS — I6330 Cerebral infarction due to thrombosis of unspecified cerebral artery: Secondary | ICD-10-CM | POA: Diagnosis not present

## 2020-12-31 DIAGNOSIS — I10 Essential (primary) hypertension: Secondary | ICD-10-CM | POA: Diagnosis not present

## 2020-12-31 DIAGNOSIS — K219 Gastro-esophageal reflux disease without esophagitis: Secondary | ICD-10-CM | POA: Diagnosis present

## 2020-12-31 DIAGNOSIS — I6381 Other cerebral infarction due to occlusion or stenosis of small artery: Secondary | ICD-10-CM | POA: Diagnosis present

## 2020-12-31 DIAGNOSIS — E871 Hypo-osmolality and hyponatremia: Secondary | ICD-10-CM | POA: Diagnosis not present

## 2020-12-31 DIAGNOSIS — I5042 Chronic combined systolic (congestive) and diastolic (congestive) heart failure: Secondary | ICD-10-CM | POA: Diagnosis present

## 2020-12-31 DIAGNOSIS — D649 Anemia, unspecified: Secondary | ICD-10-CM | POA: Diagnosis not present

## 2020-12-31 DIAGNOSIS — K529 Noninfective gastroenteritis and colitis, unspecified: Secondary | ICD-10-CM | POA: Diagnosis present

## 2020-12-31 DIAGNOSIS — I110 Hypertensive heart disease with heart failure: Secondary | ICD-10-CM | POA: Diagnosis present

## 2020-12-31 DIAGNOSIS — M069 Rheumatoid arthritis, unspecified: Secondary | ICD-10-CM | POA: Diagnosis present

## 2020-12-31 DIAGNOSIS — I2510 Atherosclerotic heart disease of native coronary artery without angina pectoris: Secondary | ICD-10-CM

## 2020-12-31 DIAGNOSIS — F32A Depression, unspecified: Secondary | ICD-10-CM | POA: Diagnosis present

## 2020-12-31 DIAGNOSIS — G9341 Metabolic encephalopathy: Secondary | ICD-10-CM | POA: Diagnosis present

## 2020-12-31 DIAGNOSIS — Z20822 Contact with and (suspected) exposure to covid-19: Secondary | ICD-10-CM | POA: Diagnosis present

## 2020-12-31 DIAGNOSIS — I6389 Other cerebral infarction: Secondary | ICD-10-CM | POA: Diagnosis not present

## 2020-12-31 DIAGNOSIS — R4701 Aphasia: Secondary | ICD-10-CM | POA: Diagnosis present

## 2020-12-31 DIAGNOSIS — C3412 Malignant neoplasm of upper lobe, left bronchus or lung: Secondary | ICD-10-CM | POA: Diagnosis not present

## 2020-12-31 DIAGNOSIS — G9340 Encephalopathy, unspecified: Secondary | ICD-10-CM | POA: Diagnosis not present

## 2020-12-31 DIAGNOSIS — I272 Pulmonary hypertension, unspecified: Secondary | ICD-10-CM | POA: Diagnosis present

## 2020-12-31 DIAGNOSIS — G629 Polyneuropathy, unspecified: Secondary | ICD-10-CM | POA: Diagnosis present

## 2020-12-31 DIAGNOSIS — G4733 Obstructive sleep apnea (adult) (pediatric): Secondary | ICD-10-CM | POA: Diagnosis present

## 2020-12-31 DIAGNOSIS — M810 Age-related osteoporosis without current pathological fracture: Secondary | ICD-10-CM | POA: Diagnosis present

## 2020-12-31 DIAGNOSIS — I083 Combined rheumatic disorders of mitral, aortic and tricuspid valves: Secondary | ICD-10-CM | POA: Diagnosis present

## 2020-12-31 DIAGNOSIS — E8779 Other fluid overload: Secondary | ICD-10-CM | POA: Diagnosis not present

## 2020-12-31 DIAGNOSIS — E222 Syndrome of inappropriate secretion of antidiuretic hormone: Secondary | ICD-10-CM | POA: Diagnosis present

## 2020-12-31 DIAGNOSIS — E785 Hyperlipidemia, unspecified: Secondary | ICD-10-CM | POA: Diagnosis present

## 2020-12-31 DIAGNOSIS — J449 Chronic obstructive pulmonary disease, unspecified: Secondary | ICD-10-CM | POA: Diagnosis present

## 2020-12-31 DIAGNOSIS — E669 Obesity, unspecified: Secondary | ICD-10-CM | POA: Diagnosis present

## 2020-12-31 LAB — RESP PANEL BY RT-PCR (FLU A&B, COVID) ARPGX2
Influenza A by PCR: NEGATIVE
Influenza B by PCR: NEGATIVE
SARS Coronavirus 2 by RT PCR: NEGATIVE

## 2020-12-31 LAB — CBC
HCT: 30.4 % — ABNORMAL LOW (ref 36.0–46.0)
Hemoglobin: 10.3 g/dL — ABNORMAL LOW (ref 12.0–15.0)
MCH: 30.7 pg (ref 26.0–34.0)
MCHC: 33.9 g/dL (ref 30.0–36.0)
MCV: 90.5 fL (ref 80.0–100.0)
Platelets: 224 10*3/uL (ref 150–400)
RBC: 3.36 MIL/uL — ABNORMAL LOW (ref 3.87–5.11)
RDW: 13.9 % (ref 11.5–15.5)
WBC: 5.5 10*3/uL (ref 4.0–10.5)
nRBC: 0 % (ref 0.0–0.2)

## 2020-12-31 LAB — URINALYSIS, ROUTINE W REFLEX MICROSCOPIC
Bilirubin Urine: NEGATIVE
Glucose, UA: NEGATIVE mg/dL
Hgb urine dipstick: NEGATIVE
Ketones, ur: NEGATIVE mg/dL
Leukocytes,Ua: NEGATIVE
Nitrite: NEGATIVE
Protein, ur: NEGATIVE mg/dL
Specific Gravity, Urine: 1.02 (ref 1.005–1.030)
pH: 6 (ref 5.0–8.0)

## 2020-12-31 LAB — OSMOLALITY, URINE: Osmolality, Ur: 596 mOsm/kg (ref 300–900)

## 2020-12-31 LAB — BASIC METABOLIC PANEL
Anion gap: 8 (ref 5–15)
BUN: 11 mg/dL (ref 8–23)
CO2: 23 mmol/L (ref 22–32)
Calcium: 8.1 mg/dL — ABNORMAL LOW (ref 8.9–10.3)
Chloride: 95 mmol/L — ABNORMAL LOW (ref 98–111)
Creatinine, Ser: 0.71 mg/dL (ref 0.44–1.00)
GFR, Estimated: >60 mL/min (ref >60)
Glucose, Bld: 97 mg/dL (ref 70–99)
Potassium: 3.9 mmol/L (ref 3.5–5.1)
Sodium: 126 mmol/L — ABNORMAL LOW (ref 135–145)

## 2020-12-31 LAB — SODIUM, URINE, RANDOM: Sodium, Ur: 100 mmol/L

## 2020-12-31 MED ORDER — FUROSEMIDE 40 MG PO TABS
40.0000 mg | ORAL_TABLET | Freq: Every day | ORAL | Status: DC
  Administered 2020-12-31 – 2021-01-03 (×4): 40 mg via ORAL
  Filled 2020-12-31 (×4): qty 1

## 2020-12-31 MED ORDER — CALCIUM CARBONATE 1250 (500 CA) MG PO TABS
1.0000 | ORAL_TABLET | Freq: Three times a day (TID) | ORAL | Status: DC
  Administered 2020-12-31 – 2021-01-03 (×9): 500 mg via ORAL
  Filled 2020-12-31 (×9): qty 1

## 2020-12-31 NOTE — Progress Notes (Addendum)
PROGRESS NOTE  Taylor Berry  JJK:[id number] DOB: 09-06-1940 DOA: 12/30/2020 PCP: Kelton Pillar, MD  Outpatient Specialists: Pulmonary, Dr. Lamonte Sakai; GI, Dr. Tarri Glenn; Allergy/asthma, Dr. Neldon Mc; Rheumatology, Dr. Amil Amen; Cardiology, Dr Stanford Breed  Brief Narrative: Taylor Berry is an 80 y.o. female with a history of COPD/asthma, lung CA s/p wedge resection, obesity, OSA, RA, NICM, HTN, HLD, uterine CA s/p hysterectomy, and depression who presented to the ED 12/25 with word finding difficulties and confusion. She had taken her first dose of effexor prior to presentation, was brought in as code stroke, though symptoms improved, CT head negative for acute finding, and exam othewise nonfocal. Neurology recommended no further imaging. Work up additionally revealed hyponatremia (Na 126) and hypocalcemia (ionized calcium 0.83). Work up was initiated, patient admitted for symptomatic hyponatremia, fluid restriction added with evidence of SIADH. Nephrology is consulted.   Assessment & Plan: Principal Problem:   Encephalopathy acute/expressive aphasia  Active Problems:   Essential (primary) hypertension   COPD (chronic obstructive pulmonary disease) (HCC)   OSA (obstructive sleep apnea)   Lung cancer, left upper lobe   Non-ischemic cardiomyopathy/diastolic and systolic CHF   Rheumatoid arthritis ([place])   non obstructive CAD   Hyponatremia   GERD (gastroesophageal reflux disease)   Hypocalcemia  Acute metabolic encephalopathy: Suspect symptomatic hyponatremia. Symptoms improved but not back to baseline yet. - Chronic small vessel ischemic changes without acute finding on CT head. No further neuroimaging recommended. If symptoms persist after Na correction, may benefit from MRI, though has hx claustrophobia - Delirium precautions - SLP eval for cognitive/language assessment  Hyponatremia, SIADH: Inappropriately concentrated urine. Volume status may be a bit up as well. Cortisol this AM is  sufficient. TSH wnl at 2.696.  - Fluid restriction in place. Appreciate nephrology recommendations for Tx. Will add serial osm/Na measurement. - Note hospitalization following wedge resection in 3716 complicated by hyponatremia down to 125 with hypervolemia responsive to lasix with normalization by time of discharge.  - Giving lasix now, monitor BMP closely.  NICM, chronic HFpEF, HTN: - Continue metoprolol. Has ARB allergy listed. - Lasix as above.  Squamous cell lung CA: s/p wedge resection LUL April 2015 with negative margins, did involve visceral pleura but negative LNs. CXR here with scar but no new finding.   Hypocalcemia: No tetany. - Supplement and monitor.   Asthma/COPD:  - Continue bronchodilator treatments.   RA:  - Continue leflunomide  Normocytic anemia: No bleeding. B12 noted to be low-normal at 305.  - Continue monitoring.   Chronic diarrhea:  - Continue colestipol  HLD:  - Continue statin  Depression: Appears currently stable mood.  - Hold effexor, though unlikely to have caused such severe hyponatremia this soon after initiation.  Obesity: Estimated body mass index is 30.47 kg/m as calculated from the following:   Height as of this encounter: 5\' 2"  (1.575 m).   Weight as of this encounter: 75.6 kg.  DVT prophylaxis: Lovenox Code Status: Full Family Communication: None at bedside Disposition Plan:  Status is: Observation  The patient will require care spanning > 2 midnights and should be moved to inpatient because: Symptomatic hyponatremia. The patient remains cognitively slowed with occasional stuttering speech. Remains nonfocal otherwise, but not at her mental baseline without alternative explanation, hyponatremia is suspected to be etiology.  Consultants:  Nephrology Neurology  Procedures:  None  Antimicrobials: None   Subjective: Frustrated that her urinary catheter is not functioning. She has a pure wick in place, states it's not letting her  pee. Wants  to go to the bathroom to attempt to urinate. Does recall her remote and recent history accurately, though stuttering speech noted. No headache reported. Says she was on a fluid pill but hasn't taken it recently.   Objective: Vitals:   12/31/20 0021 12/31/20 0428 12/31/20 0902 12/31/20 0940  BP: (!) 146/83 (!) 148/88  (!) 159/95  Pulse: 65 71  76  Resp:   16   Temp: 97.6 F (36.4 C) (!) 97.5 F (36.4 C)    TempSrc: Oral Oral    SpO2: 97% 97%    Weight:      Height:  5\' 2"  (1.575 m)      Intake/Output Summary (Last 24 hours) at 12/31/2020 1333 Last data filed at 12/31/2020 0412 Gross per 24 hour  Intake 243 ml  Output 300 ml  Net -57 ml   Filed Weights   12/30/20 2042  Weight: 75.6 kg   Gen: Elderly female in no distress Pulm: Non-labored breathing room air. Clear to auscultation bilaterally.  CV: Regular rate and rhythm. No murmur, rub, or gallop. No JVD, 1+ LE edema. GI: Abdomen soft, non-tender, non-distended, with normoactive bowel sounds. No organomegaly or masses felt. Ext: Warm, no deformities Skin: No rashes, lesions or ulcers on visualized skin Neuro: Alert, oriented. No focal weakness or sensory changes noted. Gait not assessed. No dysarthria, words are incorrect, I suspect, for her intended meaning at times. No asterixis. No papilledema.  Psych: Judgement and insight appear fair. Mood & affect appropriate.   Data Reviewed: I have personally reviewed following labs and imaging studies  CBC: Recent Labs  Lab 12/30/20 1534 12/30/20 1540 12/30/20 1821 12/31/20 0452  WBC 6.2  --   --  5.5  NEUTROABS 3.9  --   --   --   HGB 11.0* 12.2 11.6* 10.3*  HCT 33.7* 36.0 34.0* 30.4*  MCV 92.8  --   --  90.5  PLT 154  --   --  497   Basic Metabolic Panel: Recent Labs  Lab 12/30/20 1534 12/30/20 1540 12/30/20 1821 12/30/20 2051 12/31/20 0452  NA 127* 126* 126* 125* 126*  K 3.7 3.6 3.6 3.8 3.9  CL 96* 94*  --  93* 95*  CO2 24  --   --  23 23  GLUCOSE  112* 106*  --  119* 97  BUN 15 16  --  15 11  CREATININE 0.75 0.60  --  0.68 0.71  CALCIUM 8.7*  --   --  8.5* 8.1*  MG  --   --   --  1.7  --   PHOS  --   --   --  2.4*  --    GFR: Estimated Creatinine Clearance: 53.4 mL/min (by C-G formula based on SCr of 0.71 mg/dL). Liver Function Tests: Recent Labs  Lab 12/30/20 1534  AST 25  ALT 12  ALKPHOS 38  BILITOT 0.8  PROT 6.6  ALBUMIN 3.2*   No results for input(s): LIPASE, AMYLASE in the last 168 hours. Recent Labs  Lab 12/30/20 2051  AMMONIA 16   Coagulation Profile: Recent Labs  Lab 12/30/20 1534  INR 1.0   Cardiac Enzymes: No results for input(s): CKTOTAL, CKMB, CKMBINDEX, TROPONINI in the last 168 hours. BNP (last 3 results) No results for input(s): PROBNP in the last 8760 hours. HbA1C: No results for input(s): HGBA1C in the last 72 hours. CBG: Recent Labs  Lab 12/30/20 1528  GLUCAP 101*   Lipid Profile: No results for input(s): CHOL,  HDL, LDLCALC, TRIG, CHOLHDL, LDLDIRECT in the last 72 hours. Thyroid Function Tests: Recent Labs    12/30/20 2051  TSH 2.696   Anemia Panel: Recent Labs    12/30/20 2051  VITAMINB12 305   Urine analysis:    Component Value Date/Time   COLORURINE YELLOW 12/31/2020 0408   APPEARANCEUR CLEAR 12/31/2020 0408   LABSPEC 1.020 12/31/2020 0408   PHURINE 6.0 12/31/2020 0408   GLUCOSEU NEGATIVE 12/31/2020 0408   HGBUR NEGATIVE 12/31/2020 0408   BILIRUBINUR NEGATIVE 12/31/2020 0408   KETONESUR NEGATIVE 12/31/2020 0408   PROTEINUR NEGATIVE 12/31/2020 0408   UROBILINOGEN 0.2 04/11/2013 1459   NITRITE NEGATIVE 12/31/2020 0408   LEUKOCYTESUR NEGATIVE 12/31/2020 0408   Recent Results (from the past 240 hour(s))  Resp Panel by RT-PCR (Flu A&B, Covid) Nasopharyngeal Swab     Status: None   Collection Time: 12/31/20 12:50 AM   Specimen: Nasopharyngeal Swab; Nasopharyngeal(NP) swabs in vial transport medium  Result Value Ref Range Status   SARS Coronavirus 2 by RT PCR NEGATIVE  NEGATIVE Final    Comment: (NOTE) SARS-CoV-2 target nucleic acids are NOT DETECTED.  The SARS-CoV-2 RNA is generally detectable in upper respiratory specimens during the acute phase of infection. The lowest concentration of SARS-CoV-2 viral copies this assay can detect is 138 copies/mL. A negative result does not preclude SARS-Cov-2 infection and should not be used as the sole basis for treatment or other patient management decisions. A negative result may occur with  improper specimen collection/handling, submission of specimen other than nasopharyngeal swab, presence of viral mutation(s) within the areas targeted by this assay, and inadequate number of viral copies(<138 copies/mL). A negative result must be combined with clinical observations, patient history, and epidemiological information. The expected result is Negative.  Fact Sheet for Patients:  EntrepreneurPulse.com.au  Fact Sheet for Healthcare Providers:  IncredibleEmployment.be  This test is no t yet approved or cleared by the [place] FDA and  has been authorized for detection and/or diagnosis of SARS-CoV-2 by FDA under an Emergency Use Authorization (EUA). This EUA will remain  in effect (meaning this test can be used) for the duration of the COVID-19 declaration under Section 564(b)(1) of the Act, 21 U.S.C.section 360bbb-3(b)(1), unless the authorization is terminated  or revoked sooner.       Influenza A by PCR NEGATIVE NEGATIVE Final   Influenza B by PCR NEGATIVE NEGATIVE Final    Comment: (NOTE) The Xpert Xpress SARS-CoV-2/FLU/RSV plus assay is intended as an aid in the diagnosis of influenza from Nasopharyngeal swab specimens and should not be used as a sole basis for treatment. Nasal washings and aspirates are unacceptable for Xpert Xpress SARS-CoV-2/FLU/RSV testing.  Fact Sheet for Patients: EntrepreneurPulse.com.au  Fact Sheet for Healthcare  Providers: IncredibleEmployment.be  This test is not yet approved or cleared by the [place] FDA and has been authorized for detection and/or diagnosis of SARS-CoV-2 by FDA under an Emergency Use Authorization (EUA). This EUA will remain in effect (meaning this test can be used) for the duration of the COVID-19 declaration under Section 564(b)(1) of the Act, 21 U.S.C. section 360bbb-3(b)(1), unless the authorization is terminated or revoked.  Performed at [facility], [place] [address] [place], [place] [address]       Radiology Studies: DG CHEST PORT 1 VIEW  Result Date: 12/30/2020 CLINICAL DATA:  Altered mental status EXAM: PORTABLE CHEST 1 VIEW COMPARISON:  09/07/2020 CT FINDINGS: Cardiac shadow is mildly enlarged but accentuated by the frontal technique. Postsurgical changes and  scar noted along the lateral aspect of the left lung stable from the prior exam. No focal infiltrate or effusion is seen. IMPRESSION: No acute abnormality noted. Electronically Signed   By: Inez Catalina M.D.   On: 12/30/2020 21:04   CT HEAD CODE STROKE WO CONTRAST  Result Date: 12/30/2020 CLINICAL DATA:  Code stroke.  Neuro deficit, acute, stroke suspected EXAM: CT HEAD WITHOUT CONTRAST TECHNIQUE: Contiguous axial images were obtained from the base of the skull through the vertex without intravenous contrast. COMPARISON:  MRI 03/11/2008 FINDINGS: Brain: No focal abnormality seen affecting the brainstem or cerebellum. Cerebral hemispheres show age related volume loss with moderate chronic small-vessel ischemic changes of the white matter. No sign of acute infarction, mass lesion, hemorrhage, hydrocephalus or extra-axial collection. Vascular: There is atherosclerotic calcification of the major vessels at the base of the brain. Skull: Negative Sinuses/Orbits: Clear/normal Other: None ASPECTS (Peak Stroke Program Early CT Score) - Ganglionic level infarction (caudate, lentiform nuclei,  internal capsule, insula, M1-M3 cortex): 7 - Supraganglionic infarction (M4-M6 cortex): 3 Total score (0-10 with 10 being normal): 10 IMPRESSION: 1. No acute finding. Chronic small-vessel ischemic changes of the cerebral hemispheric white matter. 2. ASPECTS is 10 3. These results were communicated to Dr. Theda Sers at 3:53 pm on 12/30/2020 by text page via the [facility] messaging system. Electronically Signed   By: Nelson Chimes M.D.   On: 12/30/2020 15:53    Scheduled Meds:  albuterol  2.5 mg Inhalation See admin instructions   aspirin EC  81 mg Oral Daily   atorvastatin  40 mg Oral Daily   colestipol  2 g Oral BID   enoxaparin (LOVENOX) injection  40 mg Subcutaneous Q24H   famotidine  40 mg Oral QPM   umeclidinium bromide  1 puff Inhalation Daily   And   fluticasone furoate-vilanterol  1 puff Inhalation Daily   leflunomide  20 mg Oral Daily   metoprolol succinate  25 mg Oral q AM   psyllium  1 packet Oral Daily   sodium chloride flush  3 mL Intravenous Q12H   Continuous Infusions:  sodium chloride       LOS: 0 days   Time spent: 25 minutes.  Patrecia Pour, MD [facility] [email or web address] 12/31/2020, 1:33 PM

## 2020-12-31 NOTE — Evaluation (Signed)
Physical Therapy Evaluation Patient Details Name: Taylor Berry MRN: [id number] DOB: Mar 08, 1940 Today's Date: 12/31/2020  History of Present Illness  Pt is an 80 y/o female admitted 12/25 secondary to expressive aphasia and encephalopathy. Per notes, possibly secondary to symptomatic hyponatremia. PMH includes HTN, lung cancer, RA, COPD, and uterine cancer.  Clinical Impression  Pt admitted secondary to problem above with deficits below. Pt with increased fatigue so mobility limited to within the room. Min guard A for safety throughout. Anticipate pt will progress well. Reports son can check on her if needed. Will continue to follow acutely.        Recommendations for follow up therapy are one component of a multi-disciplinary discharge planning process, led by the attending physician.  Recommendations may be updated based on patient status, additional functional criteria and insurance authorization.  Follow Up Recommendations Home health PT    Assistance Recommended at Discharge Intermittent Supervision/Assistance  Functional Status Assessment Patient has had a recent decline in their functional status and demonstrates the ability to make significant improvements in function in a reasonable and predictable amount of time.  Equipment Recommendations  None recommended by PT    Recommendations for Other Services       Precautions / Restrictions Precautions Precautions: Fall Restrictions Weight Bearing Restrictions: No      Mobility  Bed Mobility Overal bed mobility: Needs Assistance Bed Mobility: Supine to Sit;Sit to Supine     Supine to sit: Supervision Sit to supine: Supervision   General bed mobility comments: Supervision for safety    Transfers Overall transfer level: Needs assistance Equipment used: None Transfers: Sit to/from Stand Sit to Stand: Min guard           General transfer comment: Min guard for safety    Ambulation/Gait Ambulation/Gait  assistance: Min guard Gait Distance (Feet): 20 Feet Assistive device: None Gait Pattern/deviations: Step-through pattern;Decreased stride length Gait velocity: Decreased     General Gait Details: Min guard for safety. Pt with increased fatigue and only wanting to go to the bathroom and back.  Stairs            Wheelchair Mobility    Modified Rankin (Stroke Patients Only)       Balance Overall balance assessment: Mild deficits observed, not formally tested                                           Pertinent Vitals/Pain Pain Assessment: No/denies pain    Home Living Family/patient expects to be discharged to:: Private residence Living Arrangements: Alone Available Help at Discharge: Family Type of Home: Other(Comment) (condo) Home Access: Stairs to enter Entrance Stairs-Rails: Right Entrance Stairs-Number of Steps: 3   Home Layout: One level Home Equipment: Rollator (4 wheels)      Prior Function Prior Level of Function : Independent/Modified Independent                     Hand Dominance        Extremity/Trunk Assessment   Upper Extremity Assessment Upper Extremity Assessment: Overall WFL for tasks assessed    Lower Extremity Assessment Lower Extremity Assessment: Generalized weakness    Cervical / Trunk Assessment Cervical / Trunk Assessment: Normal  Communication   Communication: No difficulties  Cognition Arousal/Alertness: Awake/alert Behavior During Therapy: WFL for tasks assessed/performed Overall Cognitive Status: No family/caregiver present to determine baseline cognitive functioning  General Comments: WFL for basic tasks.        General Comments      Exercises     Assessment/Plan    PT Assessment Patient needs continued PT services  PT Problem List Decreased strength;Decreased activity tolerance;Decreased balance;Decreased mobility;Decreased cognition        PT Treatment Interventions Gait training;Stair training;Functional mobility training;Therapeutic activities;Therapeutic exercise;Balance training;Patient/family education    PT Goals (Current goals can be found in the Care Plan section)  Acute Rehab PT Goals Patient Stated Goal: to go home PT Goal Formulation: With patient Time For Goal Achievement: 01/14/21 Potential to Achieve Goals: Good    Frequency Min 3X/week   Barriers to discharge        Co-evaluation               AM-PAC PT "6 Clicks" Mobility  Outcome Measure Help needed turning from your back to your side while in a flat bed without using bedrails?: A Little Help needed moving from lying on your back to sitting on the side of a flat bed without using bedrails?: A Little Help needed moving to and from a bed to a chair (including a wheelchair)?: A Little Help needed standing up from a chair using your arms (e.g., wheelchair or bedside chair)?: A Little Help needed to walk in hospital room?: A Little Help needed climbing 3-5 steps with a railing? : A Little 6 Click Score: 18    End of Session Equipment Utilized During Treatment: Gait belt Activity Tolerance: Patient limited by fatigue Patient left: in bed;with call bell/phone within reach;with bed alarm set Nurse Communication: Mobility status PT Visit Diagnosis: Unsteadiness on feet (R26.81);Muscle weakness (generalized) (M62.81)    Time: 5188-4166 PT Time Calculation (min) (ACUTE ONLY): 15 min   Charges:   PT Evaluation $PT Eval Low Complexity: 1 Low          Lou Miner, DPT  Acute Rehabilitation Services  Pager: [phone] Office: [phone]   Rudean Hitt 12/31/2020, 3:52 PM

## 2020-12-31 NOTE — Consult Note (Signed)
Reason for Consult: hyponatremia Referring Physician: Dr. Martha Clan is an 80 y.o. female with past medical history significant for COPD, lung ca s/p wedge resection, HTN, OSA, RA, nonischemic cardiomyopathy as well as history of uterine CA.  She tells me that she has had an issue with low sodium in the past-  treated with lasix but she stopped because she felt it wasn't doing anything.  In addition , she had weaned herself off of effexor a couple of months ago but decided to go back on, took her first dose on Saturday.  She was brought to the ER by a friend today for what sounds like confusion and aphasia-  CT negative for CVA-  theses sxms have improved since she has been here-  talking normally when I see her, just doesn't feel quite right, shaky.  I was called because her sodium was 127, then down to 125 ( was 136 in September).  Wondering if some of her presenting sxms were due to sodium.  Work up shows an inappropriate high urine osmolality-  TSH and cortisol within normal limits    Trend in Creatinine: Creatinine  Date/Time Value Ref Range Status  07/29/2016 01:06 PM 0.9 0.6 - 1.1 mg/dL Final   Creatinine, Ser  Date/Time Value Ref Range Status  12/31/2020 04:52 AM 0.71 0.44 - 1.00 mg/dL Final  12/30/2020 08:51 PM 0.68 0.44 - 1.00 mg/dL Final  12/30/2020 03:40 PM 0.60 0.44 - 1.00 mg/dL Final  12/30/2020 03:34 PM 0.75 0.44 - 1.00 mg/dL Final  09/17/2020 11:38 AM 0.92 0.40 - 1.20 mg/dL Final  03/16/2020 11:19 AM 0.86 0.57 - 1.00 mg/dL Final  02/21/2020 10:40 AM 0.88 0.57 - 1.00 mg/dL Final  05/30/2019 12:13 PM 0.78 0.57 - 1.00 mg/dL Final  12/20/2018 02:46 PM 0.74 0.57 - 1.00 mg/dL Final  01/22/2017 07:54 AM 0.58 0.40 - 1.20 mg/dL Final  10/03/2016 10:15 AM 0.66 0.57 - 1.00 mg/dL Final  04/27/2013 05:20 AM 0.49 (L) 0.50 - 1.10 mg/dL Final  04/25/2013 10:40 AM 0.44 (L) 0.50 - 1.10 mg/dL Final  04/23/2013 04:15 AM 0.50 0.50 - 1.10 mg/dL Final  04/22/2013 03:24 AM 0.57 0.50 -  1.10 mg/dL Final  04/21/2013 02:43 AM 0.60 0.50 - 1.10 mg/dL Final  04/19/2013 04:50 AM 0.57 0.50 - 1.10 mg/dL Final  04/18/2013 04:00 AM 0.50 0.50 - 1.10 mg/dL Final  04/17/2013 04:00 AM 0.49 (L) 0.50 - 1.10 mg/dL Final  04/16/2013 03:59 AM 0.48 (L) 0.50 - 1.10 mg/dL Final  04/14/2013 04:20 AM 0.46 (L) 0.50 - 1.10 mg/dL Final  04/13/2013 04:20 AM 0.50 0.50 - 1.10 mg/dL Final  04/11/2013 02:58 PM 0.60 0.50 - 1.10 mg/dL Final  01/27/2013 09:36 AM 0.8 0.4 - 1.2 mg/dL Final  01/22/2011 11:48 AM 0.8 0.4 - 1.2 mg/dL Final  12/17/2010 04:21 PM 0.8 0.4 - 1.2 mg/dL Final   Sodium  Date/Time Value Ref Range Status  12/31/2020 04:52 AM 126 (L) 135 - 145 mmol/L Final  12/30/2020 08:51 PM 125 (L) 135 - 145 mmol/L Final  12/30/2020 06:21 PM 126 (L) 135 - 145 mmol/L Final  12/30/2020 03:40 PM 126 (L) 135 - 145 mmol/L Final  12/30/2020 03:34 PM 127 (L) 135 - 145 mmol/L Final  09/17/2020 11:38 AM 136 135 - 145 mEq/L Final  03/16/2020 11:19 AM 139 134 - 144 mmol/L Final  02/21/2020 10:40 AM 143 134 - 144 mmol/L Final  05/30/2019 12:13 PM 139 134 - 144 mmol/L Final  12/20/2018 02:46 PM 138 134 -  144 mmol/L Final  01/22/2017 07:54 AM 141 135 - 145 mEq/L Final  10/03/2016 10:15 AM 140 134 - 144 mmol/L Final  07/29/2016 01:06 PM 139 136 - 145 mEq/L Final  04/27/2013 05:20 AM 135 (L) 137 - 147 mEq/L Final  04/25/2013 10:40 AM 130 (L) 137 - 147 mEq/L Final  04/23/2013 04:15 AM 129 (L) 137 - 147 mEq/L Final  04/22/2013 03:24 AM 125 (L) 137 - 147 mEq/L Final  04/21/2013 02:43 AM 125 (L) 137 - 147 mEq/L Final  04/19/2013 04:50 AM 128 (L) 137 - 147 mEq/L Final  04/18/2013 04:00 AM 126 (L) 137 - 147 mEq/L Final  04/17/2013 02:27 PM 129 (L) 137 - 147 mEq/L Final  04/17/2013 04:00 AM 129 (L) 137 - 147 mEq/L Final  04/16/2013 03:59 AM 134 (L) 137 - 147 mEq/L Final  04/14/2013 04:20 AM 135 (L) 137 - 147 mEq/L Final  04/13/2013 04:20 AM 139 137 - 147 mEq/L Final  04/12/2013 10:26 AM 138 137 - 147 mEq/L Final   04/11/2013 02:58 PM 140 137 - 147 mEq/L Final  01/27/2013 09:36 AM 138 135 - 145 mEq/L Final  01/22/2011 11:48 AM 138 135 - 145 mEq/L Final  12/17/2010 04:21 PM 141 135 - 145 mEq/L Final   PMH:   Past Medical History:  Diagnosis Date   Abnormal CT scan    Allergic rhinitis    Anemia 2015   after lung surgery   Angio-edema    Complication of anesthesia    Fentanyl allergy, "claustrophobia"   COPD (chronic obstructive pulmonary disease) ([place])    Depression    Diaphoresis 07/30/2015   Excessive sweating with little exertion   Difficult intravenous access    GERD (gastroesophageal reflux disease)    In the past   Hyperlipidemia    Hypertension    Left bundle branch block    Lung cancer ([place]) 2015   left upper lobe wedge removed-no further tx.   Neuropathy    Tingling in arms, fingers, and feet (Since 06/15/16)   Obesity    OSA (obstructive sleep apnea)    denies.   Osteoarthritis    ra also   Osteoporosis    Recurrent upper respiratory infection (URI)    Rheumatoid arthritis(714.0)    Dr. Amil Amen   SOB (shortness of breath) on exertion    Uterine cancer ([place]) 1971   tx surgical    PSH:   Past Surgical History:  Procedure Laterality Date   ABDOMINAL HYSTERECTOMY     in situ carcinoma partial   CARDIAC CATHETERIZATION  01/27/11   minor non-obs CAD, NL EF, mild pulm HTN   CATARACT EXTRACTION, BILATERAL     last done 12'16-   COLONOSCOPY W/ BIOPSIES AND POLYPECTOMY     COLONOSCOPY WITH PROPOFOL N/A 02/13/2015   Procedure: COLONOSCOPY WITH PROPOFOL;  Surgeon: Laurence Spates, MD;  [facility];  Service: Endoscopy;  Laterality: N/A;   ESOPHAGOGASTRODUODENOSCOPY (EGD) WITH PROPOFOL N/A 09/30/2016   Procedure: ESOPHAGOGASTRODUODENOSCOPY (EGD) WITH PROPOFOL;  Surgeon: Laurence Spates, MD;  [facility];  Service: Endoscopy;  Laterality: N/A;   LYMPH NODE DISSECTION Left 04/12/2013   Procedure: LYMPH NODE DISSECTION;  Surgeon: [name], MD;  Location:  [facility];  Service: Thoracic;  Laterality: Left;   RIGHT/LEFT HEART CATH AND CORONARY ANGIOGRAPHY N/A 10/08/2016   Procedure: RIGHT/LEFT HEART CATH AND CORONARY ANGIOGRAPHY;  Surgeon: Burnell Blanks, MD;  Location: [facility];  Service: Cardiovascular;  Laterality: N/A;   TONSILLECTOMY  VESICOVAGINAL FISTULA CLOSURE W/ TAH  1971   VIDEO ASSISTED THORACOSCOPY (VATS)/THOROCOTOMY Left 04/12/2013   Procedure: VIDEO ASSISTED THORACOSCOPY (VATS)/THOROCOTOMY, WITH LEFT UPPER LOBE WEDGE RESECTION, CHEST WALL BIOPSY ;  Surgeon: [name], MD;  Location: [facility];  Service: Thoracic;  Laterality: Left;   VIDEO BRONCHOSCOPY N/A 04/12/2013   Procedure: VIDEO BRONCHOSCOPY;  Surgeon: [name], MD;  Location: [facility];  Service: Thoracic;  Laterality: N/A;    Allergies:  Allergies  Allergen Reactions   Latex Itching and Rash   Ace Inhibitors Cough   Angiotensin Receptor Blockers Swelling and Other (See Comments)    Tongue swelling   Fentanyl Other (See Comments)    Hallucinations "I go crazy"      Hydrochlorothiazide Other (See Comments)    Decreased Sodium   Hydroxychloroquine Other (See Comments)    "Decreased sodium"   Penicillins Other (See Comments)    Yeast infections Has patient had a PCN reaction causing immediate rash, facial/tongue/throat swelling, SOB or lightheadedness with hypotension:No Has patient had a PCN reaction causing severe rash involving mucus membranes or skin necrosis: No Has patient had a PCN reaction that required hospitalization No Has patient had a PCN reaction occurring within the last 10 years: Yes If all of the above answers are "NO", then may proceed with Cephalosporin use.  Other reaction(s): yeast infection    Medications:   Prior to Admission medications   Medication Sig Start Date End Date Taking? Authorizing Provider  albuterol (VENTOLIN HFA) 108 (90 Base) MCG/ACT inhaler INHALE 2 PUFFS BY MOUTH IN EACH NOSTRIL EVERY 4 TO 6 HOURS AS  NEEDED Patient taking differently: Inhale 2 puffs into the lungs See admin instructions. Inhale 2 puffs into the lungs every 4-6 hours as needed for wheezing or shortness of breath 08/31/20  Yes Byrum, Rose Fillers, MD  AMBULATORY NON FORMULARY MEDICATION Take 1 capsule by mouth See admin instructions. Synogut probiotic capsules- Take 1 capsule by mouth in the morning and at bedtime   Yes [provider]  aspirin 81 MG EC tablet Take 81 mg by mouth daily.   Yes [provider]  atorvastatin (LIPITOR) 40 MG tablet TAKE 1 TABLET(40 MG) BY MOUTH DAILY Patient taking differently: Take 40 mg by mouth daily. 09/04/20  Yes [name], MD  CALCIUM-VITAMIN D PO Take 1 tablet by mouth daily.   Yes [provider]  candesartan (ATACAND) 8 MG tablet TAKE 1 TABLET(8 MG) BY MOUTH DAILY Patient taking differently: Take 8 mg by mouth in the morning. 12/09/19  Yes [name], MD  colestipol (COLESTID) 1 g tablet Take 2 tablets (2 g total) by mouth 2 (two) times daily. 12/11/20  Yes Thornton Park, MD  denosumab (PROLIA) 60 MG/ML SOSY injection Inject 60 mg into the skin every 6 (six) months.   Yes [provider]  famotidine (PEPCID) 40 MG tablet Take 40 mg by mouth every evening.   Yes [provider]  fluticasone (FLONASE) 50 MCG/ACT nasal spray Place 1-2 sprays into both nostrils daily as needed for allergies or rhinitis.   Yes [provider]  Fluticasone-Umeclidin-Vilant (TRELEGY ELLIPTA) 100-62.5-25 MCG/ACT AEPB Inhale 1 puff into the lungs daily. 12/11/20  Yes Kozlow, Donnamarie Poag, MD  lansoprazole (PREVACID) 30 MG capsule Take 1 capsule (30 mg total) by mouth every morning. Patient taking differently: Take 30 mg by mouth daily as needed (for heartburn). 05/01/20  Yes Kozlow, Donnamarie Poag, MD  leflunomide (ARAVA) 20 MG tablet Take 20 mg  by mouth daily.   Yes [provider]  METAMUCIL FIBER PO Take by mouth See admin instructions. Mix 1 tablespoonful  of powder into 4-8 ounces of desired beverage and drink once a day   Yes [provider]  metoprolol succinate (TOPROL-XL) 25 MG 24 hr tablet TAKE 1 TABLET(25 MG) BY MOUTH DAILY Patient taking differently: Take 25 mg by mouth in the morning. 10/10/20  Yes [name], MD  predniSONE (DELTASONE) 5 MG tablet Take 5 mg by mouth daily as needed (AS DIRECTED- for R.A. flares).   Yes [provider]  venlafaxine XR (EFFEXOR-XR) 75 MG 24 hr capsule Take 75 mg by mouth daily with breakfast.   Yes [provider]    Discontinued Meds:   Medications Discontinued During This Encounter  Medication Reason   prednisoLONE 5 MG TABS tablet Entry Error   Probiotic Product (PROBIOTIC PO) Entry Error   irbesartan (AVAPRO) tablet 75 mg    Fluticasone-Umeclidin-Vilant 100-62.5-25 MCG/ACT AEPB 1 puff     Social History:  reports that she quit smoking about 33 years ago. Her smoking use included cigarettes. She has a 35.00 pack-year smoking history. She has never used smokeless tobacco. She reports that she does not drink alcohol and does not use drugs.  Family History:   Family History  Problem Relation Age of Onset   Lung cancer Mother    Coronary artery disease Son    Heart attack Son 67   Arthritis/Rheumatoid Maternal Grandmother    Coronary artery disease Son    Arthritis/Rheumatoid Son     Pertinent items noted in HPI and remainder of comprehensive ROS otherwise negative.  Blood pressure (!) 159/95, pulse 76, temperature (!) 97.5 F (36.4 C), temperature source Oral, resp. rate 16, height 5\' 2"  (1.575 m), weight 75.6 kg, SpO2 97 %. General appearance: alert, fatigued, and slowed mentation Resp: clear to auscultation bilaterally Cardio: regular rate and rhythm, S1, S2 normal, no murmur, click, rub or gallop GI: soft, non-tender; bowel sounds normal; no masses,  no organomegaly Extremities: edema trace to 1+ Neurologic: Grossly normal  says word finding difficulty  is much better at this time  Labs: Basic Metabolic Panel: Recent Labs  Lab 12/30/20 1534 12/30/20 1540 12/30/20 1821 12/30/20 2051 12/31/20 0452  NA 127* 126* 126* 125* 126*  K 3.7 3.6 3.6 3.8 3.9  CL 96* 94*  --  93* 95*  CO2 24  --   --  23 23  GLUCOSE 112* 106*  --  119* 97  BUN 15 16  --  15 11  CREATININE 0.75 0.60  --  0.68 0.71  ALBUMIN 3.2*  --   --   --   --   CALCIUM 8.7*  --   --  8.5* 8.1*  PHOS  --   --   --  2.4*  --    Liver Function Tests: Recent Labs  Lab 12/30/20 1534  AST 25  ALT 12  ALKPHOS 38  BILITOT 0.8  PROT 6.6  ALBUMIN 3.2*   No results for input(s): LIPASE, AMYLASE in the last 168 hours. Recent Labs  Lab 12/30/20 2051  AMMONIA 16   CBC: Recent Labs  Lab 12/30/20 1534 12/30/20 1540 12/30/20 1821 12/31/20 0452  WBC 6.2  --   --  5.5  NEUTROABS 3.9  --   --   --   HGB 11.0* 12.2 11.6* 10.3*  HCT 33.7* 36.0 34.0* 30.4*  MCV 92.8  --   --  90.5  PLT 154  --   --  224   PT/INR: @labrcntip (inr:5) Cardiac Enzymes: No results for input(s): CKTOTAL, CKMB, CKMBINDEX, TROPONINI in the last 168 hours. CBG: Recent Labs  Lab 12/30/20 1528  GLUCAP 101*    Iron Studies: No results for input(s): IRON, TIBC, TRANSFERRIN, FERRITIN in the last 168 hours.  Xrays/Other Studies: DG CHEST PORT 1 VIEW  Result Date: 12/30/2020 CLINICAL DATA:  Altered mental status EXAM: PORTABLE CHEST 1 VIEW COMPARISON:  09/07/2020 CT FINDINGS: Cardiac shadow is mildly enlarged but accentuated by the frontal technique. Postsurgical changes and scar noted along the lateral aspect of the left lung stable from the prior exam. No focal infiltrate or effusion is seen. IMPRESSION: No acute abnormality noted. Electronically Signed   By: Inez Catalina M.D.   On: 12/30/2020 21:04   CT HEAD CODE STROKE WO CONTRAST  Result Date: 12/30/2020 CLINICAL DATA:  Code stroke.  Neuro deficit, acute, stroke suspected EXAM: CT HEAD WITHOUT CONTRAST TECHNIQUE: Contiguous axial  images were obtained from the base of the skull through the vertex without intravenous contrast. COMPARISON:  MRI 03/11/2008 FINDINGS: Brain: No focal abnormality seen affecting the brainstem or cerebellum. Cerebral hemispheres show age related volume loss with moderate chronic small-vessel ischemic changes of the white matter. No sign of acute infarction, mass lesion, hemorrhage, hydrocephalus or extra-axial collection. Vascular: There is atherosclerotic calcification of the major vessels at the base of the brain. Skull: Negative Sinuses/Orbits: Clear/normal Other: None ASPECTS ([place] Stroke Program Early CT Score) - Ganglionic level infarction (caudate, lentiform nuclei, internal capsule, insula, M1-M3 cortex): 7 - Supraganglionic infarction (M4-M6 cortex): 3 Total score (0-10 with 10 being normal): 10 IMPRESSION: 1. No acute finding. Chronic small-vessel ischemic changes of the cerebral hemispheric white matter. 2. ASPECTS is 10 3. These results were communicated to Dr. Theda Sers at 3:53 pm on 12/30/2020 by text page via the [facility] messaging system. Electronically Signed   By: Nelson Chimes M.D.   On: 12/30/2020 15:53     Assessment/Plan: 80 year old WF with multiple medical issues including copd and previous lung CA-  has had hyponatremia in the past and has it in a mild form now  1.  Hyponatremia-  has had this in the past.  A high urine osm is highly suggestive of SIADH.  I suspect that she has been found to have this before since has been on a loop diuretic.  In addition , her volume status is a little heavy -  has at least trace edema and not a low BP so possibly an element of hypervolemic hyponatremia as well. I suspect her SIADH is more a result of her chronic pulmonary issues than other but effexor can cause SIADH-  not that I think that 2 doses would result in a significant sodium change.  But agree with holding effexor.  I told her that lasix is a common treatment for SIADH and I suspect that is why  she was on it previously -  she agrees to go back on "whatever you think is best" .   For the record, I do not think her slightly decreased sodium was the cause of the neurological sxms she was having 2.  Aphasia and confusion-  seems like maybe a TIA-  per primary team    Louis Meckel 12/31/2020, 12:26 PM

## 2020-12-31 NOTE — Evaluation (Signed)
Speech Language Pathology Evaluation Patient Details Name: Taylor Berry MRN: [id number] DOB: 1940-04-19 Today's Date: 12/31/2020 Time: 8563-1497 SLP Time Calculation (min) (ACUTE ONLY): 24 min  Problem List:  Patient Active Problem List   Diagnosis Date Noted   Encephalopathy acute/expressive aphasia  12/30/2020   Hyponatremia 12/30/2020   GERD (gastroesophageal reflux disease) 12/30/2020   Hypocalcemia 12/30/2020   Acute on chronic combined systolic and diastolic CHF (congestive heart failure) ([place]) 04/28/2019   non obstructive CAD 04/28/2019   Edema 04/14/2019   Abnormal CT of the chest 03/22/2018   Acid reflux 07/13/2017   Affective psychosis ([place]) 07/13/2017   Alcohol dependence in remission ([place]) 07/13/2017   BD (Bowen's disease) 07/13/2017   History of colon polyps 07/13/2017   Rheumatoid arthritis ([place]) 07/13/2017   Chronic bronchitis ([place]) 07/13/2017   Secondary pulmonary hypertension 07/13/2017   Obstructive apnea 07/13/2017   Age related osteoporosis 07/13/2017   Non-ischemic cardiomyopathy/diastolic and systolic CHF    Hyperhidrosis 06/18/2016   Anemia in neoplastic disease 05/02/2013   Allergic rhinitis 04/29/2013   Depression 04/29/2013   Lung cancer, left upper lobe 02/03/2013   Pulmonary infiltrates 01/05/2013   Mild pulmonary hypertension ([place]) 02/17/2011   OSA (obstructive sleep apnea) 02/17/2011   Dyspnea on exertion 11/22/2010   COPD (chronic obstructive pulmonary disease) ([place]) 07/20/2009   Carcinoma in situ of cervix uteri 06/26/2009   Pure hypercholesterolemia 06/26/2009   OBESITY 06/26/2009   UNSPECIFIED ANEMIA 06/26/2009   Essential (primary) hypertension 06/26/2009   ARTHRITIS, RHEUMATOID 06/26/2009   OSTEOPOROSIS 06/26/2009   Cough 06/26/2009   Past Medical History:  Past Medical History:  Diagnosis Date   Abnormal CT scan    Allergic rhinitis    Anemia 2015   after lung surgery   Angio-edema    Complication of anesthesia     Fentanyl allergy, "claustrophobia"   COPD (chronic obstructive pulmonary disease) ([place])    Depression    Diaphoresis 07/30/2015   Excessive sweating with little exertion   Difficult intravenous access    GERD (gastroesophageal reflux disease)    In the past   Hyperlipidemia    Hypertension    Left bundle branch block    Lung cancer ([place]) 2015   left upper lobe wedge removed-no further tx.   Neuropathy    Tingling in arms, fingers, and feet (Since 06/15/16)   Obesity    OSA (obstructive sleep apnea)    denies.   Osteoarthritis    ra also   Osteoporosis    Recurrent upper respiratory infection (URI)    Rheumatoid arthritis(714.0)    Dr. Amil Amen   SOB (shortness of breath) on exertion    Uterine cancer ([place]) 1971   tx surgical   Past Surgical History:  Past Surgical History:  Procedure Laterality Date   ABDOMINAL HYSTERECTOMY     in situ carcinoma partial   CARDIAC CATHETERIZATION  01/27/11   minor non-obs CAD, NL EF, mild pulm HTN   CATARACT EXTRACTION, BILATERAL     last done 12'16-   COLONOSCOPY W/ BIOPSIES AND POLYPECTOMY     COLONOSCOPY WITH PROPOFOL N/A 02/13/2015   Procedure: COLONOSCOPY WITH PROPOFOL;  Surgeon: Laurence Spates, MD;  [facility];  Service: Endoscopy;  Laterality: N/A;   ESOPHAGOGASTRODUODENOSCOPY (EGD) WITH PROPOFOL N/A 09/30/2016   Procedure: ESOPHAGOGASTRODUODENOSCOPY (EGD) WITH PROPOFOL;  Surgeon: Laurence Spates, MD;  [facility];  Service: Endoscopy;  Laterality: N/A;   LYMPH NODE DISSECTION Left 04/12/2013   Procedure: LYMPH NODE DISSECTION;  Surgeon: Percell Miller  Maryruth Bun, MD;  Location: [facility];  Service: Thoracic;  Laterality: Left;   RIGHT/LEFT HEART CATH AND CORONARY ANGIOGRAPHY N/A 10/08/2016   Procedure: RIGHT/LEFT HEART CATH AND CORONARY ANGIOGRAPHY;  Surgeon: Burnell Blanks, MD;  Location: [facility];  Service: Cardiovascular;  Laterality: N/A;   TONSILLECTOMY     VESICOVAGINAL FISTULA CLOSURE W/ TAH  1971    VIDEO ASSISTED THORACOSCOPY (VATS)/THOROCOTOMY Left 04/12/2013   Procedure: VIDEO ASSISTED THORACOSCOPY (VATS)/THOROCOTOMY, WITH LEFT UPPER LOBE WEDGE RESECTION, CHEST WALL BIOPSY ;  Surgeon: [name], MD;  Location: [facility];  Service: Thoracic;  Laterality: Left;   VIDEO BRONCHOSCOPY N/A 04/12/2013   Procedure: VIDEO BRONCHOSCOPY;  Surgeon: [name], MD;  Location: [facility];  Service: Thoracic;  Laterality: N/A;   HPI:  Pt is an 80 yo female admitted with AMS and word finding difficulty. CTH negative. Pt suspected to have acute metabolic encephalopathy from symptomatic hyponatremia. PMHx includes: COPD, depression, GERD, HLD, HTN, lung cancer s/p LUL wedge removal, OSA ,OA, RA, and uterine cancer.   Assessment / Plan / Recommendation Clinical Impression  Pt scored 22/30 on the SLUMS, which is suggestive of mild cognitive impairment. Pt and son report that she is independent PTA, but today she has difficulty with immediate and delayed recall, divergent naming, and mildly complex problem solving. Her selective attention is also impacted, so SLP reduced environmental distractors throughout testing to facilitate her ability to attend. Pt's son says that she is significantly improved from previous date but they agree that she is not at her baseline. Will continue to follow acutely, particularly as a neurological cause could still be considered.    SLP Assessment  SLP Recommendation/Assessment: Patient needs continued Speech [place] [facility] SLP Visit Diagnosis: Cognitive communication deficit (R41.841)    Recommendations for follow up therapy are one component of a multi-disciplinary discharge planning process, led by the attending physician.  Recommendations may be updated based on patient status, additional functional criteria and insurance authorization.    Follow Up Recommendations  Home health SLP    Assistance Recommended at Discharge     Functional Status Assessment  Patient has had a recent decline in their functional status and demonstrates the ability to make significant improvements in function in a reasonable and predictable amount of time.  Frequency and Duration min 2x/week  2 weeks      SLP Evaluation Cognition  Overall Cognitive Status: Impaired/Different from baseline Arousal/Alertness: Awake/alert Orientation Level: Oriented X4 Attention: Selective Selective Attention: Impaired Selective Attention Impairment: Verbal basic Memory: Impaired Memory Impairment: Storage deficit;Retrieval deficit Awareness: Impaired Awareness Impairment: Intellectual impairment Problem Solving: Impaired Problem Solving Impairment: Verbal complex Safety/Judgment: Impaired       Comprehension  Auditory Comprehension Overall Auditory Comprehension: Appears within functional limits for tasks assessed    Expression Expression Primary Mode of Expression: Verbal Verbal Expression Overall Verbal Expression: Impaired Initiation: No impairment Automatic Speech: Name;Social Response Level of Generative/Spontaneous Verbalization: Sentence;Conversation Naming: Impairment Divergent:  (6 words in one minute)   Oral / Motor  Motor Speech Overall Motor Speech: Appears within functional limits for tasks assessed            Osie Bond., M.A. [facility] Pager [phone] Office [phone]  12/31/2020, 3:58 PM

## 2021-01-01 ENCOUNTER — Inpatient Hospital Stay (HOSPITAL_COMMUNITY): Payer: HMO

## 2021-01-01 DIAGNOSIS — G9340 Encephalopathy, unspecified: Secondary | ICD-10-CM | POA: Diagnosis not present

## 2021-01-01 DIAGNOSIS — I2510 Atherosclerotic heart disease of native coronary artery without angina pectoris: Secondary | ICD-10-CM | POA: Diagnosis not present

## 2021-01-01 DIAGNOSIS — E871 Hypo-osmolality and hyponatremia: Secondary | ICD-10-CM | POA: Diagnosis not present

## 2021-01-01 DIAGNOSIS — J449 Chronic obstructive pulmonary disease, unspecified: Secondary | ICD-10-CM | POA: Diagnosis not present

## 2021-01-01 LAB — BASIC METABOLIC PANEL
Anion gap: 8 (ref 5–15)
BUN: 13 mg/dL (ref 8–23)
CO2: 24 mmol/L (ref 22–32)
Calcium: 8.2 mg/dL — ABNORMAL LOW (ref 8.9–10.3)
Chloride: 94 mmol/L — ABNORMAL LOW (ref 98–111)
Creatinine, Ser: 0.78 mg/dL (ref 0.44–1.00)
GFR, Estimated: >60 mL/min (ref >60)
Glucose, Bld: 92 mg/dL (ref 70–99)
Potassium: 4 mmol/L (ref 3.5–5.1)
Sodium: 126 mmol/L — ABNORMAL LOW (ref 135–145)

## 2021-01-01 LAB — URINE CULTURE: Culture: 20000 — AB

## 2021-01-01 LAB — OSMOLALITY: Osmolality: 261 mOsm/kg — ABNORMAL LOW (ref 275–295)

## 2021-01-01 MED ORDER — LORAZEPAM 2 MG/ML IJ SOLN
1.0000 mg | Freq: Once | INTRAMUSCULAR | Status: AC | PRN
  Administered 2021-01-01: 1 mg via INTRAVENOUS
  Filled 2021-01-01: qty 1

## 2021-01-01 MED ORDER — THIAMINE HCL 100 MG PO TABS
500.0000 mg | ORAL_TABLET | Freq: Every day | ORAL | Status: DC
  Administered 2021-01-01 – 2021-01-03 (×3): 500 mg via ORAL
  Filled 2021-01-01 (×3): qty 5

## 2021-01-01 NOTE — Plan of Care (Signed)
  Problem: Activity: Goal: Risk for activity intolerance will decrease Outcome: Progressing   Problem: Nutrition: Goal: Adequate nutrition will be maintained Outcome: Progressing   Problem: Coping: Goal: Level of anxiety will decrease Outcome: Progressing   Problem: Pain Managment: Goal: General experience of comfort will improve Outcome: Progressing   Problem: Safety: Goal: Ability to remain free from injury will improve Outcome: Progressing   

## 2021-01-01 NOTE — Care Management (Signed)
°  Transition of Care (TOC) Screening Note   Patient Details  Name: Taylor Berry Date of Birth: 1940-09-12   Transition of Care [facility]) CM/SW Contact:    Carles Collet, RN Phone Number: 01/01/2021, 4:36 PM    Transition of Care Department [facility]) has reviewed patient and no TOC needs have been identified at this time. We will continue to monitor patient advancement through interdisciplinary progression rounds. If new patient transition needs arise, please place a TOC consult.

## 2021-01-01 NOTE — Progress Notes (Addendum)
PROGRESS NOTE  Taylor Berry  OAC:[id number] DOB: [birth year] DOA: 12/30/2020 PCP: Kelton Pillar, MD  Outpatient Specialists: Pulmonary, Dr. Lamonte Sakai; GI, Dr. Tarri Glenn; Allergy/asthma, Dr. Neldon Mc; Rheumatology, Dr. Amil Amen; Cardiology, Dr Stanford Breed  Brief Narrative: Taylor Berry is an 79 y.o. female with a history of COPD/asthma, lung CA s/p wedge resection, obesity, OSA, RA, NICM, HTN, HLD, uterine CA s/p hysterectomy, and depression who presented to the ED 12/25 with word finding difficulties and confusion. She had taken her first dose of effexor prior to presentation, was brought in as code stroke, though symptoms improved, CT head negative for acute finding, and exam othewise nonfocal. Neurology recommended no further imaging. Work up additionally revealed hyponatremia (Na 126) and hypocalcemia (ionized calcium 0.83). Work up was initiated, patient admitted for symptomatic hyponatremia, fluid restriction and lasix added with evidence of SIADH. Nephrology is consulted. MRI brain is pending.  Assessment & Plan: Principal Problem:   Encephalopathy acute/expressive aphasia  Active Problems:   Essential (primary) hypertension   COPD (chronic obstructive pulmonary disease) (HCC)   OSA (obstructive sleep apnea)   Lung cancer, left upper lobe   Non-ischemic cardiomyopathy/diastolic and systolic CHF   Rheumatoid arthritis ([place])   non obstructive CAD   Hyponatremia   GERD (gastroesophageal reflux disease)   Hypocalcemia  Acute metabolic encephalopathy: Suspect symptomatic hyponatremia. Symptoms improved but not back to baseline yet. - Chronic small vessel ischemic changes without acute finding on CT head. Still doesn't feel back to baseline mental status. Check MR brain, will need anxiolytic (ordered).  - Delirium precautions - SLP eval for cognitive/language assessment: SLUMS 22/30 on 12/26. - Add high dose thiamine  Hyponatremia, SIADH: Inappropriately concentrated urine. Volume status  may be a bit up as well. Cortisol this AM is sufficient. TSH wnl at 2.696.  - Fluid restrict - Continue lasix - Monitor Na - Note hospitalization following wedge resection in 0109 complicated by hyponatremia down to 125 with hypervolemia responsive to lasix with normalization by time of discharge.  - Giving lasix now, monitor BMP closely.  NICM, chronic HFpEF, HTN: - Continue metoprolol. Has ARB allergy listed. - Lasix as above.  Squamous cell lung CA: s/p wedge resection LUL April 2015 with negative margins, did involve visceral pleura but negative LNs. CXR here with scar but no new finding.   Hypocalcemia: No tetany. - Continue supplement and monitor.  - iPTH pending, check vitamin D  Asthma/COPD:  - Continue bronchodilator treatments.   RA:  - Continue leflunomide  Normocytic anemia: No bleeding. B12 noted to be wnl at 305.  - Continue monitoring.   Chronic diarrhea:  - Continue colestipol  HLD:  - Continue statin  Depression: Appears currently stable mood.  - Hold effexor, though unlikely to have caused such severe hyponatremia this soon after initiation.  Obesity: Estimated body mass index is 30.47 kg/m as calculated from the following:   Height as of this encounter: 5\' 2"  (1.575 m).   Weight as of this encounter: 75.6 kg.  DVT prophylaxis: Lovenox Code Status: Full Family Communication: None at bedside Disposition Plan:  Status is: Inpatient due to persistent AMS and hyponatremia. Consultants:  Nephrology Neurology  Procedures:  None  Antimicrobials: None   Subjective: Gets worn out with exertion but not short of breath. No focal weakness, just diffuse. No numbness. No pain to report. Her thinking and speaking are not back to her baseline.  Objective: Vitals:   12/31/20 1946 01/01/21 0600 01/01/21 0817 01/01/21 0825  BP: 127/70 (!) 128/58  110/72  Pulse: 64 66  70  Resp: 18 16 16    Temp: 97.9 F (36.6 C) 97.9 F (36.6 C)    TempSrc: Oral Oral     SpO2: 98% 98%    Weight:      Height:        Intake/Output Summary (Last 24 hours) at 01/01/2021 1302 Last data filed at 01/01/2021 0557 Gross per 24 hour  Intake --  Output 2200 ml  Net -2200 ml   Filed Weights   12/30/20 2042  Weight: 75.6 kg   Gen: Elderly female in no distress Pulm: Nonlabored breathing room air. Clear. CV: Regular rate and rhythm. No murmur, rub, or gallop. No JVD, trace dependent edema. GI: Abdomen soft, non-tender, non-distended, with normoactive bowel sounds.  Ext: Warm, no deformities Skin: No new rashes, lesions or ulcers on visualized skin. Neuro: Alert and oriented. No focal neurological deficits or dysarthria. Speech and cognition is quicker than previous but still stuttering/delayed. Marland Kitchen Psych: Judgement and insight appear fair. Mood euthymic & affect congruent. Behavior is appropriate.    Data Reviewed: I have personally reviewed following labs and imaging studies  CBC: Recent Labs  Lab 12/30/20 1534 12/30/20 1540 12/30/20 1821 12/31/20 0452  WBC 6.2  --   --  5.5  NEUTROABS 3.9  --   --   --   HGB 11.0* 12.2 11.6* 10.3*  HCT 33.7* 36.0 34.0* 30.4*  MCV 92.8  --   --  90.5  PLT 154  --   --  852   Basic Metabolic Panel: Recent Labs  Lab 12/30/20 1534 12/30/20 1540 12/30/20 1821 12/30/20 2051 12/31/20 0452 01/01/21 0344  NA 127* 126* 126* 125* 126* 126*  K 3.7 3.6 3.6 3.8 3.9 4.0  CL 96* 94*  --  93* 95* 94*  CO2 24  --   --  23 23 24   GLUCOSE 112* 106*  --  119* 97 92  BUN 15 16  --  15 11 13   CREATININE 0.75 0.60  --  0.68 0.71 0.78  CALCIUM 8.7*  --   --  8.5* 8.1* 8.2*  MG  --   --   --  1.7  --   --   PHOS  --   --   --  2.4*  --   --    GFR: Estimated Creatinine Clearance: 53.4 mL/min (by C-G formula based on SCr of 0.78 mg/dL). Liver Function Tests: Recent Labs  Lab 12/30/20 1534  AST 25  ALT 12  ALKPHOS 38  BILITOT 0.8  PROT 6.6  ALBUMIN 3.2*   No results for input(s): LIPASE, AMYLASE in the last 168  hours. Recent Labs  Lab 12/30/20 2051  AMMONIA 16   Coagulation Profile: Recent Labs  Lab 12/30/20 1534  INR 1.0   Cardiac Enzymes: No results for input(s): CKTOTAL, CKMB, CKMBINDEX, TROPONINI in the last 168 hours. BNP (last 3 results) No results for input(s): PROBNP in the last 8760 hours. HbA1C: No results for input(s): HGBA1C in the last 72 hours. CBG: Recent Labs  Lab 12/30/20 1528  GLUCAP 101*   Lipid Profile: No results for input(s): CHOL, HDL, LDLCALC, TRIG, CHOLHDL, LDLDIRECT in the last 72 hours. Thyroid Function Tests: Recent Labs    12/30/20 2051  TSH 2.696   Anemia Panel: Recent Labs    12/30/20 2051  VITAMINB12 305   Urine analysis:    Component Value Date/Time   COLORURINE YELLOW 12/31/2020 0408   APPEARANCEUR CLEAR 12/31/2020 0408  LABSPEC 1.020 12/31/2020 0408   PHURINE 6.0 12/31/2020 0408   GLUCOSEU NEGATIVE 12/31/2020 0408   HGBUR NEGATIVE 12/31/2020 0408   BILIRUBINUR NEGATIVE 12/31/2020 0408   KETONESUR NEGATIVE 12/31/2020 0408   PROTEINUR NEGATIVE 12/31/2020 0408   UROBILINOGEN 0.2 04/11/2013 1459   NITRITE NEGATIVE 12/31/2020 0408   LEUKOCYTESUR NEGATIVE 12/31/2020 0408   Recent Results (from the past 240 hour(s))  Urine Culture     Status: Abnormal   Collection Time: 12/30/20  4:08 AM   Specimen: Urine, Clean Catch  Result Value Ref Range Status   Specimen Description URINE, CLEAN CATCH  Final   Special Requests   Final    NONE Performed at [facility], [place] [address] [place], [place] [address]    Culture (A)  Final    20,000 COLONIES/mL MULTIPLE SPECIES PRESENT, SUGGEST RECOLLECTION   Report Status 01/01/2021 FINAL  Final  Resp Panel by RT-PCR (Flu A&B, Covid) Nasopharyngeal Swab     Status: None   Collection Time: 12/31/20 12:50 AM   Specimen: Nasopharyngeal Swab; Nasopharyngeal(NP) swabs in vial transport medium  Result Value Ref Range Status   SARS Coronavirus 2 by RT PCR NEGATIVE NEGATIVE Final    Comment:  (NOTE) SARS-CoV-2 target nucleic acids are NOT DETECTED.  The SARS-CoV-2 RNA is generally detectable in upper respiratory specimens during the acute phase of infection. The lowest concentration of SARS-CoV-2 viral copies this assay can detect is 138 copies/mL. A negative result does not preclude SARS-Cov-2 infection and should not be used as the sole basis for treatment or other patient management decisions. A negative result may occur with  improper specimen collection/handling, submission of specimen other than nasopharyngeal swab, presence of viral mutation(s) within the areas targeted by this assay, and inadequate number of viral copies(<138 copies/mL). A negative result must be combined with clinical observations, patient history, and epidemiological information. The expected result is Negative.  Fact Sheet for Patients:  EntrepreneurPulse.com.au  Fact Sheet for Healthcare Providers:  IncredibleEmployment.be  This test is no t yet approved or cleared by the [place] FDA and  has been authorized for detection and/or diagnosis of SARS-CoV-2 by FDA under an Emergency Use Authorization (EUA). This EUA will remain  in effect (meaning this test can be used) for the duration of the COVID-19 declaration under Section 564(b)(1) of the Act, 21 U.S.C.section 360bbb-3(b)(1), unless the authorization is terminated  or revoked sooner.       Influenza A by PCR NEGATIVE NEGATIVE Final   Influenza B by PCR NEGATIVE NEGATIVE Final    Comment: (NOTE) The Xpert Xpress SARS-CoV-2/FLU/RSV plus assay is intended as an aid in the diagnosis of influenza from Nasopharyngeal swab specimens and should not be used as a sole basis for treatment. Nasal washings and aspirates are unacceptable for Xpert Xpress SARS-CoV-2/FLU/RSV testing.  Fact Sheet for Patients: EntrepreneurPulse.com.au  Fact Sheet for Healthcare  Providers: IncredibleEmployment.be  This test is not yet approved or cleared by the [place] FDA and has been authorized for detection and/or diagnosis of SARS-CoV-2 by FDA under an Emergency Use Authorization (EUA). This EUA will remain in effect (meaning this test can be used) for the duration of the COVID-19 declaration under Section 564(b)(1) of the Act, 21 U.S.C. section 360bbb-3(b)(1), unless the authorization is terminated or revoked.  Performed at [facility], [place] [address] [place], [place] [address]       Radiology Studies: DG CHEST PORT 1 VIEW  Result Date: 12/30/2020 CLINICAL DATA:  Altered  mental status EXAM: PORTABLE CHEST 1 VIEW COMPARISON:  09/07/2020 CT FINDINGS: Cardiac shadow is mildly enlarged but accentuated by the frontal technique. Postsurgical changes and scar noted along the lateral aspect of the left lung stable from the prior exam. No focal infiltrate or effusion is seen. IMPRESSION: No acute abnormality noted. Electronically Signed   By: Inez Catalina M.D.   On: 12/30/2020 21:04   CT HEAD CODE STROKE WO CONTRAST  Result Date: 12/30/2020 CLINICAL DATA:  Code stroke.  Neuro deficit, acute, stroke suspected EXAM: CT HEAD WITHOUT CONTRAST TECHNIQUE: Contiguous axial images were obtained from the base of the skull through the vertex without intravenous contrast. COMPARISON:  MRI 03/11/2008 FINDINGS: Brain: No focal abnormality seen affecting the brainstem or cerebellum. Cerebral hemispheres show age related volume loss with moderate chronic small-vessel ischemic changes of the white matter. No sign of acute infarction, mass lesion, hemorrhage, hydrocephalus or extra-axial collection. Vascular: There is atherosclerotic calcification of the major vessels at the base of the brain. Skull: Negative Sinuses/Orbits: Clear/normal Other: None ASPECTS ([place] Stroke Program Early CT Score) - Ganglionic level infarction (caudate, lentiform nuclei,  internal capsule, insula, M1-M3 cortex): 7 - Supraganglionic infarction (M4-M6 cortex): 3 Total score (0-10 with 10 being normal): 10 IMPRESSION: 1. No acute finding. Chronic small-vessel ischemic changes of the cerebral hemispheric white matter. 2. ASPECTS is 10 3. These results were communicated to Dr. Theda Sers at 3:53 pm on 12/30/2020 by text page via the [facility] messaging system. Electronically Signed   By: Nelson Chimes M.D.   On: 12/30/2020 15:53    Scheduled Meds:  albuterol  2.5 mg Inhalation See admin instructions   aspirin EC  81 mg Oral Daily   atorvastatin  40 mg Oral Daily   calcium carbonate  1 tablet Oral TID WC   colestipol  2 g Oral BID   enoxaparin (LOVENOX) injection  40 mg Subcutaneous Q24H   famotidine  40 mg Oral QPM   umeclidinium bromide  1 puff Inhalation Daily   And   fluticasone furoate-vilanterol  1 puff Inhalation Daily   furosemide  40 mg Oral Daily   leflunomide  20 mg Oral Daily   metoprolol succinate  25 mg Oral q AM   psyllium  1 packet Oral Daily   sodium chloride flush  3 mL Intravenous Q12H   Continuous Infusions:  sodium chloride       LOS: 1 day   Time spent: 25 minutes.  Patrecia Pour, MD [facility] [email or web address] 01/01/2021, 1:02 PM

## 2021-01-01 NOTE — Progress Notes (Signed)
Subjective:  lasix started yesterday-   sodium stable at 126-  still feels off "are they going to do an MRI ?"  Objective Vital signs in last 24 hours: Vitals:   12/31/20 1946 01/01/21 0600 01/01/21 0817 01/01/21 0825  BP: 127/70 (!) 128/58  110/72  Pulse: 64 66  70  Resp: 18 16 16    Temp: 97.9 F (36.6 C) 97.9 F (36.6 C)    TempSrc: Oral Oral    SpO2: 98% 98%    Weight:      Height:       Weight change:   Intake/Output Summary (Last 24 hours) at 01/01/2021 1048 Last data filed at 01/01/2021 0557 Gross per 24 hour  Intake --  Output 2200 ml  Net -2200 ml    Assessment/Plan: 80 year old WF with multiple medical issues including copd and previous lung CA-  has had hyponatremia in the past and has it in a mild form now  1.  Hyponatremia-  has had this in the past.  A high urine osm is highly suggestive of SIADH.  I suspect that she has been found to have this before since has been on a loop diuretic.  In addition , her volume status is a little heavy -  has at least trace edema and not a low BP so possibly an element of hypervolemic hyponatremia as well. I suspect her SIADH is more a result of her chronic pulmonary issues than other but effexor can cause SIADH-  not that I think that 2 doses would result in a significant sodium change.  But agree with holding effexor.  I told her that lasix is a common treatment for SIADH and I suspect that is why she was on it previously -  she agrees to go back on "whatever you think is best" .   For the record, I do not think her slightly decreased sodium was the cause of the neurological sxms she was having.  Lasix has been started-  takes some time to see an improvement-  sodium not low enough to get samsca  2.  Aphasia and confusion-  seems like maybe a TIA, not quite back to normal-  per primary team    Louis Meckel    Labs: Basic Metabolic Panel: Recent Labs  Lab 12/30/20 2051 12/31/20 0452 01/01/21 0344  NA 125* 126* 126*  K  3.8 3.9 4.0  CL 93* 95* 94*  CO2 23 23 24   GLUCOSE 119* 97 92  BUN 15 11 13   CREATININE 0.68 0.71 0.78  CALCIUM 8.5* 8.1* 8.2*  PHOS 2.4*  --   --    Liver Function Tests: Recent Labs  Lab 12/30/20 1534  AST 25  ALT 12  ALKPHOS 38  BILITOT 0.8  PROT 6.6  ALBUMIN 3.2*   No results for input(s): LIPASE, AMYLASE in the last 168 hours. Recent Labs  Lab 12/30/20 2051  AMMONIA 16   CBC: Recent Labs  Lab 12/30/20 1534 12/30/20 1540 12/30/20 1821 12/31/20 0452  WBC 6.2  --   --  5.5  NEUTROABS 3.9  --   --   --   HGB 11.0* 12.2 11.6* 10.3*  HCT 33.7* 36.0 34.0* 30.4*  MCV 92.8  --   --  90.5  PLT 154  --   --  224   Cardiac Enzymes: No results for input(s): CKTOTAL, CKMB, CKMBINDEX, TROPONINI in the last 168 hours. CBG: Recent Labs  Lab 12/30/20 1528  GLUCAP 101*  Iron Studies: No results for input(s): IRON, TIBC, TRANSFERRIN, FERRITIN in the last 72 hours. Studies/Results: DG CHEST PORT 1 VIEW  Result Date: 12/30/2020 CLINICAL DATA:  Altered mental status EXAM: PORTABLE CHEST 1 VIEW COMPARISON:  09/07/2020 CT FINDINGS: Cardiac shadow is mildly enlarged but accentuated by the frontal technique. Postsurgical changes and scar noted along the lateral aspect of the left lung stable from the prior exam. No focal infiltrate or effusion is seen. IMPRESSION: No acute abnormality noted. Electronically Signed   By: Inez Catalina M.D.   On: 12/30/2020 21:04   CT HEAD CODE STROKE WO CONTRAST  Result Date: 12/30/2020 CLINICAL DATA:  Code stroke.  Neuro deficit, acute, stroke suspected EXAM: CT HEAD WITHOUT CONTRAST TECHNIQUE: Contiguous axial images were obtained from the base of the skull through the vertex without intravenous contrast. COMPARISON:  MRI 03/11/2008 FINDINGS: Brain: No focal abnormality seen affecting the brainstem or cerebellum. Cerebral hemispheres show age related volume loss with moderate chronic small-vessel ischemic changes of the white matter. No sign of  acute infarction, mass lesion, hemorrhage, hydrocephalus or extra-axial collection. Vascular: There is atherosclerotic calcification of the major vessels at the base of the brain. Skull: Negative Sinuses/Orbits: Clear/normal Other: None ASPECTS ([place] Stroke Program Early CT Score) - Ganglionic level infarction (caudate, lentiform nuclei, internal capsule, insula, M1-M3 cortex): 7 - Supraganglionic infarction (M4-M6 cortex): 3 Total score (0-10 with 10 being normal): 10 IMPRESSION: 1. No acute finding. Chronic small-vessel ischemic changes of the cerebral hemispheric white matter. 2. ASPECTS is 10 3. These results were communicated to Dr. Theda Sers at 3:53 pm on 12/30/2020 by text page via the [facility] messaging system. Electronically Signed   By: Nelson Chimes M.D.   On: 12/30/2020 15:53   Medications: Infusions:  sodium chloride      Scheduled Medications:  albuterol  2.5 mg Inhalation See admin instructions   aspirin EC  81 mg Oral Daily   atorvastatin  40 mg Oral Daily   calcium carbonate  1 tablet Oral TID WC   colestipol  2 g Oral BID   enoxaparin (LOVENOX) injection  40 mg Subcutaneous Q24H   famotidine  40 mg Oral QPM   umeclidinium bromide  1 puff Inhalation Daily   And   fluticasone furoate-vilanterol  1 puff Inhalation Daily   furosemide  40 mg Oral Daily   leflunomide  20 mg Oral Daily   metoprolol succinate  25 mg Oral q AM   psyllium  1 packet Oral Daily   sodium chloride flush  3 mL Intravenous Q12H    have reviewed scheduled and prn medications.  Physical Exam: General:  NAD-  says got up to wash up and it took a lot our of her Heart: RRR Lungs: mostly clear Abdomen: soft, non tender Extremities: min edema    01/01/2021,10:48 AM  LOS: 1 day

## 2021-01-02 ENCOUNTER — Inpatient Hospital Stay (HOSPITAL_COMMUNITY): Payer: HMO

## 2021-01-02 ENCOUNTER — Encounter (HOSPITAL_COMMUNITY): Payer: Self-pay | Admitting: Family Medicine

## 2021-01-02 ENCOUNTER — Encounter: Payer: Self-pay | Admitting: Gastroenterology

## 2021-01-02 DIAGNOSIS — E7800 Pure hypercholesterolemia, unspecified: Secondary | ICD-10-CM

## 2021-01-02 DIAGNOSIS — I6389 Other cerebral infarction: Secondary | ICD-10-CM | POA: Diagnosis not present

## 2021-01-02 DIAGNOSIS — C3412 Malignant neoplasm of upper lobe, left bronchus or lung: Secondary | ICD-10-CM

## 2021-01-02 DIAGNOSIS — I10 Essential (primary) hypertension: Secondary | ICD-10-CM | POA: Diagnosis not present

## 2021-01-02 DIAGNOSIS — I6330 Cerebral infarction due to thrombosis of unspecified cerebral artery: Secondary | ICD-10-CM | POA: Diagnosis not present

## 2021-01-02 DIAGNOSIS — G9340 Encephalopathy, unspecified: Secondary | ICD-10-CM | POA: Diagnosis not present

## 2021-01-02 DIAGNOSIS — E871 Hypo-osmolality and hyponatremia: Secondary | ICD-10-CM | POA: Diagnosis not present

## 2021-01-02 DIAGNOSIS — G4733 Obstructive sleep apnea (adult) (pediatric): Secondary | ICD-10-CM | POA: Diagnosis not present

## 2021-01-02 DIAGNOSIS — I63012 Cerebral infarction due to thrombosis of left vertebral artery: Secondary | ICD-10-CM

## 2021-01-02 LAB — BASIC METABOLIC PANEL
Anion gap: 8 (ref 5–15)
BUN: 18 mg/dL (ref 8–23)
CO2: 27 mmol/L (ref 22–32)
Calcium: 9.5 mg/dL (ref 8.9–10.3)
Chloride: 92 mmol/L — ABNORMAL LOW (ref 98–111)
Creatinine, Ser: 1.06 mg/dL — ABNORMAL HIGH (ref 0.44–1.00)
GFR, Estimated: 53 mL/min — ABNORMAL LOW (ref >60)
Glucose, Bld: 112 mg/dL — ABNORMAL HIGH (ref 70–99)
Potassium: 4.5 mmol/L (ref 3.5–5.1)
Sodium: 127 mmol/L — ABNORMAL LOW (ref 135–145)

## 2021-01-02 LAB — ECHOCARDIOGRAM COMPLETE
Area-P 1/2: 3.27 cm2
Calc EF: 41.5 %
Height: 62 in
S' Lateral: 3.35 cm
Single Plane A2C EF: 37.9 %
Single Plane A4C EF: 50.3 %
Weight: 2665.6 oz

## 2021-01-02 LAB — PTH, INTACT AND CALCIUM: Calcium, Total (PTH): 8.8 mg/dL (ref 8.7–10.3)

## 2021-01-02 LAB — LIPID PANEL
Cholesterol: 128 mg/dL (ref 0–200)
HDL: 60 mg/dL (ref >40)
LDL Cholesterol: 58 mg/dL (ref 0–99)
Total CHOL/HDL Ratio: 2.1 RATIO
Triglycerides: 51 mg/dL (ref <150)
VLDL: 10 mg/dL (ref 0–40)

## 2021-01-02 LAB — HEMOGLOBIN A1C
Hgb A1c MFr Bld: 5.4 % (ref 4.8–5.6)
Mean Plasma Glucose: 108.28 mg/dL

## 2021-01-02 LAB — VITAMIN D 25 HYDROXY (VIT D DEFICIENCY, FRACTURES): Vit D, 25-Hydroxy: 48.77 ng/mL (ref 30–100)

## 2021-01-02 MED ORDER — STROKE: EARLY STAGES OF RECOVERY BOOK
Freq: Once | Status: AC
  Filled 2021-01-02: qty 1

## 2021-01-02 MED ORDER — GADOBUTROL 1 MMOL/ML IV SOLN
7.0000 mL | Freq: Once | INTRAVENOUS | Status: AC | PRN
  Administered 2021-01-02: 7 mL via INTRAVENOUS

## 2021-01-02 MED ORDER — CLOPIDOGREL BISULFATE 75 MG PO TABS
75.0000 mg | ORAL_TABLET | Freq: Every day | ORAL | Status: DC
  Administered 2021-01-02 – 2021-01-03 (×2): 75 mg via ORAL
  Filled 2021-01-02 (×2): qty 1

## 2021-01-02 MED ORDER — LORAZEPAM 2 MG/ML IJ SOLN
1.0000 mg | Freq: Once | INTRAMUSCULAR | Status: AC | PRN
  Administered 2021-01-02: 1 mg via INTRAVENOUS
  Filled 2021-01-02: qty 1

## 2021-01-02 NOTE — Progress Notes (Signed)
Speech Language Pathology Treatment: Cognitive-Linquistic  Patient Details Name: Taylor Berry MRN: [id number] DOB: [birth year] Today's Date: 01/02/2021 Time: 0962-8366 SLP Time Calculation (min) (ACUTE ONLY): 14 min  Assessment / Plan / Recommendation Clinical Impression  Pt was drowsy this afternoon after reportedly waking early and having several tests throughout the day. Despite this, she needed only Min cues for recall of daily events and new information. She participated in verbal problem solving tasks related to safety at home and provided good insight into basic safety scenarios, but shows less anticipatory awareness as to how acute changes may impact function upon return home. Would still recommend [facility] SLP and intermittent supervision.   HPI HPI: Pt is an 80 yo female admitted with AMS and word finding difficulty. CTH negative. Pt suspected to have acute metabolic encephalopathy from symptomatic hyponatremia. PMHx includes: COPD, depression, GERD, HLD, HTN, lung cancer s/p LUL wedge removal, OSA ,OA, RA, and uterine cancer.      SLP Plan  Continue with current plan of care      Recommendations for follow up therapy are one component of a multi-disciplinary discharge planning process, led by the attending physician.  Recommendations may be updated based on patient status, additional functional criteria and insurance authorization.    Recommendations                   Oral Care Recommendations: Oral care BID Follow Up Recommendations: Home health SLP Assistance recommended at discharge: Intermittent Supervision/Assistance SLP Visit Diagnosis: Cognitive communication deficit (Q94.765) Plan: Continue with current plan of care           Osie Bond., M.A. [facility] Pager [phone] Office [phone]  01/02/2021, 3:31 PM

## 2021-01-02 NOTE — Progress Notes (Signed)
Subjective:  MRI did show a small CVA in the pons- neurology saw -  now on plavix-  son is here today - has appropriate questions- sodium has improved to 127 today   Objective Vital signs in last 24 hours: Vitals:   01/01/21 0825 01/01/21 2014 01/02/21 0654 01/02/21 0749  BP: 110/72 133/80 130/76   Pulse: 70 82 98   Resp:      Temp:  98 F (36.7 C) 97.8 F (36.6 C)   TempSrc:  Oral Oral   SpO2:  95% 94% 95%  Weight:      Height:       Weight change:   Intake/Output Summary (Last 24 hours) at 01/02/2021 1134 Last data filed at 01/02/2021 0430 Gross per 24 hour  Intake --  Output 650 ml  Net -650 ml    Assessment/Plan: 80 year old WF with multiple medical issues including copd and previous lung CA-  has had hyponatremia in the past and has it in a mild form now  1.  Hyponatremia-  has had in the past.  A high urine osm is highly suggestive of SIADH.  In addition , her volume status was a little heavy -  has at least trace edema and not a low BP so possibly an element of hypervolemic hyponatremia as well. I suspect her SIADH is more a result of her chronic pulmonary issues than other but effexor can cause SIADH-  I dont think that 2 doses would result in a significant sodium change.  agree with holding effexor.  I told her that lasix is a common treatment for SIADH and I suspect that is why she was on it previously -  she agrees to go back on "whatever you think is best" .   For the record, I do not think her slightly decreased sodium was the cause of the neurological sxms she was having.  Lasix has been started-  takes some time to see an improvement-  sodium not low enough to get samsca at this time-  if otherwise ready to go home - a sodium of 127 would not be a contraindication.  I did start her on 40 mg daily but would probably send out on 20 daily to avoid hypovolemia 2.  Aphasia and confusion-  seems like maybe a TIA, not quite back to normal-  now MRI has shown stroke is on protocol     Louis Meckel    Labs: Basic Metabolic Panel: Recent Labs  Lab 12/30/20 2051 12/31/20 0452 01/01/21 0344 01/02/21 0418  NA 125* 126* 126* 127*  K 3.8 3.9 4.0 4.5  CL 93* 95* 94* 92*  CO2 '23 23 24 27  '$ GLUCOSE 119* 97 92 112*  BUN '15 11 13 18  '$ CREATININE 0.68 0.71 0.78 1.06*  CALCIUM 8.5* 8.1* 8.2* 9.5  PHOS 2.4*  --   --   --    Liver Function Tests: Recent Labs  Lab 12/30/20 1534  AST 25  ALT 12  ALKPHOS 38  BILITOT 0.8  PROT 6.6  ALBUMIN 3.2*   No results for input(s): LIPASE, AMYLASE in the last 168 hours. Recent Labs  Lab 12/30/20 2051  AMMONIA 16   CBC: Recent Labs  Lab 12/30/20 1534 12/30/20 1540 12/30/20 1821 12/31/20 0452  WBC 6.2  --   --  5.5  NEUTROABS 3.9  --   --   --   HGB 11.0* 12.2 11.6* 10.3*  HCT 33.7* 36.0 34.0* 30.4*  MCV  92.8  --   --  90.5  PLT 154  --   --  224   Cardiac Enzymes: No results for input(s): CKTOTAL, CKMB, CKMBINDEX, TROPONINI in the last 168 hours. CBG: Recent Labs  Lab 12/30/20 1528  GLUCAP 101*    Iron Studies: No results for input(s): IRON, TIBC, TRANSFERRIN, FERRITIN in the last 72 hours. Studies/Results: MR ANGIO HEAD WO CONTRAST  Result Date: 01/02/2021 CLINICAL DATA:  Stroke follow-up EXAM: MRA HEAD WITHOUT CONTRAST TECHNIQUE: Angiographic images of the Circle of Willis were acquired using MRA technique without intravenous contrast. COMPARISON:  Brain MRI from yesterday FINDINGS: Anterior circulation: No major branch occlusion or flow limiting stenosis. Diffusely attenuated right A1 segment which appears developmental. Downward pointing outpouching from the distal right MCA measuring 2 mm. On source images there is a closely neighboring branch extending inferiorly from this level which cannot be resolved relative to the outpouching. Posterior circulation: The vertebral and basilar arteries are smoothly contoured and widely patent. No branch occlusion, beading, or aneurysm. Anatomic variants: As  above IMPRESSION: 1. No emergent finding.  No stenosis of the basilar. 2. 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 Electronically Signed   By: Jorje Guild M.D.   On: 01/02/2021 07:28   MR BRAIN WO CONTRAST  Result Date: 01/01/2021 CLINICAL DATA:  Word-finding difficulties and confusion EXAM: MRI HEAD WITHOUT CONTRAST TECHNIQUE: Multiplanar, multiecho pulse sequences of the brain and surrounding structures were obtained without intravenous contrast. COMPARISON:  03/11/2008 MRI, correlation is also made with 12/30/2020 CT head FINDINGS: Brain: Small focus of increased signal on diffusion-weighted imaging with ADC correlate in the left pons (series 3, image 18 and series 300, image 18), most likely an acute to subacute infarct. No other diffusion-weighted signal abnormalities. No acute hemorrhage, mass, mass effect, or midline shift. Punctate foci of hemosiderin deposition in the right basal ganglia, left thalamus, right pons, and left cerebellum, likely sequela of remote hypertensive microhemorrhages. T2 hyperintense signal in the periventricular white matter, likely the sequela of chronic small vessel ischemic disease. No hydrocephalus or extra-axial collection. Vascular: Normal flow voids. Skull and upper cervical spine: Normal marrow signal. Sinuses/Orbits: Negative.  Status post bilateral lens replacements. Other: Fluid in left mastoid air cells. IMPRESSION: Punctate focus of restricted diffusion in the left pons, consistent with an acute to subacute infarct. 1. Electronically Signed   By: Merilyn Baba M.D.   On: 01/01/2021 18:57   Medications: Infusions:  sodium chloride      Scheduled Medications:  albuterol  2.5 mg Inhalation See admin instructions   aspirin EC  81 mg Oral Daily   atorvastatin  40 mg Oral Daily   calcium carbonate  1 tablet Oral TID WC   clopidogrel  75 mg Oral Daily   colestipol  2 g Oral BID   enoxaparin (LOVENOX) injection  40 mg Subcutaneous Q24H   famotidine  40 mg  Oral QPM   umeclidinium bromide  1 puff Inhalation Daily   And   fluticasone furoate-vilanterol  1 puff Inhalation Daily   furosemide  40 mg Oral Daily   leflunomide  20 mg Oral Daily   metoprolol succinate  25 mg Oral q AM   psyllium  1 packet Oral Daily   sodium chloride flush  3 mL Intravenous Q12H   thiamine  500 mg Oral Daily    have reviewed scheduled and prn medications.  Physical Exam: General:  NAD-  getting PT Heart: RRR Lungs: mostly clear Abdomen: soft, non tender  Extremities: no edema now    01/02/2021,11:34 AM  LOS: 2 days

## 2021-01-02 NOTE — Progress Notes (Signed)
 Occupational Therapy Treatment Patient Details Name: Taylor Berry MRN: [id number] DOB: [birth year] Today's Date: 01/02/2021   History of present illness Pt is an 80 y/o female admitted 12/25 secondary to expressive aphasia and encephalopathy. Per notes, possibly secondary to symptomatic hyponatremia. 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 PMH includes HTN, lung cancer, RA, COPD, and uterine cancer.   OT comments  PTA, pt lives alone and reports typically Independent with all ADLs, IADLs, mobility and driving. Pt presents now with deficits in strength, balance, cognition, and endurance. Despite encouragement/education, pt resistant to OOB activities with OT. Pt observed to mobilize in hallway with PT earlier today with safety concerns noted. Pt requires Min A for UB ADL and Mod A for LB ADL due to deficits. Pt's son present; educated that frequent close supervision and light hands on assist would need to be provided for ADLs/mobility to decrease fall risk for safe DC home, especially with pt's reported dizziness. If increased family support not able to be provided, would recommend short term post acute rehab at DC. Will finalize OT DC recs pending pt willingness for OOB activities.    Recommendations for follow up therapy are one component of a multi-disciplinary discharge planning process, led by the attending physician.  Recommendations may be updated based on patient status, additional functional criteria and insurance authorization.    Follow Up Recommendations  Other (comment) (TBD pending OOB participation with OT services)    Assistance Recommended at Discharge Frequent or constant Supervision/Assistance  Equipment Recommendations  Other (comment) (Rolling walker)    Recommendations for Other Services      Precautions / Restrictions Precautions Precautions: Fall Restrictions Weight Bearing Restrictions: No       Mobility Bed Mobility Overal bed mobility: Needs  Assistance             General bed mobility comments: able to long sit without assist in bed, declined any further EOB/OOB activities with OT    Transfers Overall transfer level: Needs assistance Equipment used: None Transfers: Sit to/from Stand Sit to Stand: Min guard           General transfer comment: declined     Balance Overall balance assessment: Needs assistance Sitting-balance support: No upper extremity supported;Feet supported Sitting balance-Leahy Scale: Good     Standing balance support: Single extremity supported;Bilateral upper extremity supported;No upper extremity supported Standing balance-Leahy Scale: Fair Standing balance comment: Able to stand without UE support and take a few steps but preferring 1-2 UE support for mobility with unsteadiness noted this date.                           ADL either performed or assessed with clinical judgement   ADL Overall ADL's : Needs assistance/impaired Eating/Feeding: Set up;Bed level   Grooming: Set up;Bed level   Upper Body Bathing: Minimal assistance;Bed level   Lower Body Bathing: Moderate assistance;Sitting/lateral leans;Bed level   Upper Body Dressing : Minimal assistance;Bed level Upper Body Dressing Details (indicate cue type and reason): to change gown long sitting in bed Lower Body Dressing: Moderate assistance;Sitting/lateral leans;Bed level       Toileting- Clothing Manipulation and Hygiene: Moderate assistance;Sit to/from stand         General ADL Comments: Pt resistant to OOB activities, cititng fatigue and poor motivation. Educated on importance of OOB activities, OT eval to determine any DC needs. Son present and OT/son had observed pt ambulting in hallway with PT -noted difficulties  and reports of dizziness. REinforced safety, fall prevention    Extremity/Trunk Assessment Upper Extremity Assessment Upper Extremity Assessment: Overall WFL for tasks assessed   Lower Extremity  Assessment Lower Extremity Assessment: Defer to PT evaluation   Cervical / Trunk Assessment Cervical / Trunk Assessment: Normal    Vision Baseline Vision/History: 1 Wears glasses Ability to See in Adequate Light: 0 Adequate Patient Visual Report: No change from baseline Vision Assessment?: No apparent visual deficits Additional Comments: denies double vision, new blurriness   Perception     Praxis      Cognition Arousal/Alertness: Awake/alert Behavior During Therapy: Flat affect;Agitated Overall Cognitive Status: Impaired/Different from baseline Area of Impairment: Memory;Safety/judgement;Attention;Following commands                   Current Attention Level: Selective Memory: Decreased short-term memory Following Commands: Follows one step commands with increased time Safety/Judgement: Decreased awareness of safety;Decreased awareness of deficits     General Comments: Pt with STM deficits, decreased awareness of deficits and reasoning for OT eval. Endorses feeling "mean" after steroids          Exercises     Shoulder Instructions       General Comments Extended time spent educating on therapy purpose, OOB encouragement with pt reporting she had not bathed today but did not have a desire to attempt any tasks OT providing solutions to many of her reasons for not participating though pt still declined    Pertinent Vitals/ Pain       Pain Assessment: No/denies pain Faces Pain Scale: No hurt Pain Intervention(s): Monitored during session  Home Living Family/patient expects to be discharged to:: Private residence Living Arrangements: Alone Available Help at Discharge: Family Type of Home: Other(Comment) (condo) Home Access: Stairs to enter Technical  of Steps: 3 Entrance Stairs-Rails: Right Home Layout: One level     Bathroom Shower/Tub: Occupational psychologist: Standard     Home Equipment: Rollator (4 wheels);Shower seat           Prior Functioning/Environment              Frequency  Min 2X/week        Progress Toward Goals  OT Goals(current goals can now be found in the care plan section)     Acute Rehab OT Goals Patient Stated Goal: go home OT Goal Formulation: With patient Time For Goal Achievement: 01/16/21 Potential to Achieve Goals: Good  Plan      Co-evaluation                 AM-PAC OT "6 Clicks" Daily Activity     Outcome Measure   Help from another person eating meals?: A Little Help from another person taking care of personal grooming?: A Little Help from another person toileting, which includes using toliet, bedpan, or urinal?: A Lot Help from another person bathing (including washing, rinsing, drying)?: A Lot Help from another person to put on and taking off regular upper body clothing?: A Little Help from another person to put on and taking off regular lower body clothing?: A Lot 6 Click Score: 15    End of Session    OT Visit Diagnosis: Other abnormalities of gait and mobility (R26.89);Unsteadiness on feet (R26.81)   Activity Tolerance Other (comment) (self limiting)   Patient Left in bed;with call bell/phone within reach;with family/visitor present   Nurse Communication          Time: 0981-1914 OT Time Calculation (min): 19  min  Charges: OT General Charges $OT Visit: 1 Visit OT Evaluation $OT Eval Moderate Complexity: 1 Mod  Malachy Chamber, OTR/L Acute Rehab Services Office: [phone]   Layla Maw 01/02/2021, 1:12 PM

## 2021-01-02 NOTE — Significant Event (Addendum)
MRI brain showing punctate acute-subacute stroke in pons.  Will page Dr. Cheral Marker.  Will order: Stroke pathway MRA head and neck 2d echo PT/OT/SLP ASA 325 for now till neuro makes recs.

## 2021-01-02 NOTE — Progress Notes (Signed)
°  Echocardiogram 2D Echocardiogram has been performed.  Taylor Berry 01/02/2021, 2:43 PM

## 2021-01-02 NOTE — Progress Notes (Addendum)
OT Cancellation Note  Patient Details Name: Taylor Berry MRN: [id number] DOB: 07/01/1940   Cancelled Treatment:    Reason Eval/Treat Not Completed: Other (comment) Pt currently with breakfast tray. Will follow-up for OT eval.  Re-attempted around 11:30AM. MD entering during OT initiation of session and lunch tray also delivered.  Layla Maw 01/02/2021, 7:58 AM

## 2021-01-02 NOTE — TOC Initial Note (Signed)
Transition of Care [facility]) - Initial/Assessment Note    Patient Details  Name: Taylor Berry MRN: [id number] Date of Birth: 1940/10/12  [facility] CM/SW Contact:    Carles Collet, RN Phone Number: 01/02/2021, 1:32 PM  Clinical Narrative:          Spoke w patient and son at bedside. Patient lives alone, son lives 5 minutes away. They both are comfortable with DC w [place] services. Discussed providers and referral made to and accepted by [facility].  Patient requested RW for DC, this is ordered and will be delivered to room today. Patient has rollator and shower seat.  Son will provide transportation home [facility] will continue to follow          Expected Discharge Plan: [place] Barriers to Discharge: Continued Medical Work up   Patient Goals and CMS Choice Patient states their goals for this hospitalization and ongoing recovery are:: to go home CMS Medicare.gov Compare Post Acute Care list provided to:: Patient Choice offered to / list presented to : Patient  Expected Discharge Plan and Services Expected Discharge Plan: [place]   Discharge Planning Services: CM Consult Post Acute Care Choice: Home Health, Durable Medical Equipment Living arrangements for the past 2 months: Single Family Home                 DME Arranged: Walker rolling DME Agency: [employer] Date DME Agency Contacted: 01/02/21 Time DME Agency Contacted: 27   HH Arranged: PT, OT, Speech Therapy HH Agency: [place] Date HH Agency Contacted: 01/02/21 Time [place]: 70 Representative spoke with at [place] Arrangements/Services Living arrangements for the past 2 months: [place] with:: Self   Do you feel safe going back to the place where you live?: Yes               Activities of Daily Living   ADL Screening (condition at time of admission) Patient's cognitive ability adequate to safely  complete daily activities?: Yes Is the patient deaf or have difficulty hearing?: No Does the patient have difficulty seeing, even when wearing glasses/contacts?: No Does the patient have difficulty concentrating, remembering, or making decisions?: No Patient able to express need for assistance with ADLs?: Yes Does the patient have difficulty dressing or bathing?: No Independently performs ADLs?: Yes (appropriate for developmental age) Does the patient have difficulty walking or climbing stairs?: No Weakness of Legs: Both Weakness of Arms/Hands: None  Permission Sought/Granted                  Emotional Assessment              Admission diagnosis:  Hyponatremia [E87.1] Encephalopathy acute [G93.40] AMS (altered mental status) [R41.82] Patient Active Problem List   Diagnosis Date Noted   Encephalopathy acute/expressive aphasia  12/30/2020   Hyponatremia 12/30/2020   GERD (gastroesophageal reflux disease) 12/30/2020   Hypocalcemia 12/30/2020   Acute on chronic combined systolic and diastolic CHF (congestive heart failure) ([place]) 04/28/2019   non obstructive CAD 04/28/2019   Edema 04/14/2019   Abnormal CT of the chest 03/22/2018   Acid reflux 07/13/2017   Affective psychosis ([place]) 07/13/2017   Alcohol dependence in remission ([place]) 07/13/2017   BD (Bowen's disease) 07/13/2017   History of colon polyps 07/13/2017   Rheumatoid arthritis ([place]) 07/13/2017   Chronic bronchitis ([place]) 07/13/2017   Secondary pulmonary hypertension 07/13/2017   Obstructive apnea 07/13/2017  Age related osteoporosis 07/13/2017   Non-ischemic cardiomyopathy/diastolic and systolic CHF    Hyperhidrosis 06/18/2016   Anemia in neoplastic disease 05/02/2013   Allergic rhinitis 04/29/2013   Depression 04/29/2013   Lung cancer, left upper lobe 02/03/2013   Pulmonary infiltrates 01/05/2013   Mild pulmonary hypertension ([place]) 02/17/2011   OSA (obstructive sleep apnea) 02/17/2011   Dyspnea on  exertion 11/22/2010   COPD (chronic obstructive pulmonary disease) ([place]) 07/20/2009   Carcinoma in situ of cervix uteri 06/26/2009   Pure hypercholesterolemia 06/26/2009   OBESITY 06/26/2009   UNSPECIFIED ANEMIA 06/26/2009   Essential (primary) hypertension 06/26/2009   ARTHRITIS, RHEUMATOID 06/26/2009   OSTEOPOROSIS 06/26/2009   Cough 06/26/2009   PCP:  Kelton Pillar, MD Pharmacy:   [facility] [address], [place] - [address] [place] [address] Phone: [phone] Fax: [phone]  [facility] - [place], [place] - [address] [place] [address] Phone: [phone] Fax: [phone]  [facility] [id number] [place], [place] - [place] [address] [place] [address] Phone: [phone] Fax: [phone]     Social Determinants of Health (SDOH) Interventions    Readmission Risk Interventions No flowsheet data found.

## 2021-01-02 NOTE — Progress Notes (Signed)
[facility] PROGRESS NOTE    Progress Note  Taylor Berry  ERX:[id number] DOB: 1940-07-22 DOA: 12/30/2020 PCP: Kelton Pillar, MD     Brief Narrative:   Taylor Berry is an 80 y.o. female past medical history of COPD, lung cancer status post wedge resection, obstructive sleep apnea, rheumatoid arthritis, nonischemic cardiomyopathy, essential hypertension uterine cancer status post hysterectomy depression comes into the ED Christmas Day with word finding difficulties.  CT of the head was negative for acute finding   Assessment/Plan:   Acute punctuated left pontine CVA: HgbA1c 5.4, fasting lipid panel HDL greater than 40 LDL less than 70, she is currently on a statin. MRI of the brain showed CVA. PT, OT, Speech consult  Of the head and neck showed no significant stenosis to 2 mm right MCA aneurysm Transthoracic Echo, pending Start patient on DAPT therapy ASA  and plavix  BP goal: permissive HTN upto 220/120 mmHg Telemetry monitoring  Hyponatremia: Concern about SIADH, serum osmolarity of 261 Cortisol level 7.6, TSH was 2.6. She was placed on fluid restriction and Lasix. Her sodium is slowly improving fluid restriction and Lasix will continue current management recheck a basic metabolic panel in the morning.  Nonischemic cardiomyopathy: Continue metoprolol. And Lasix.  History of squamous cell carcinoma of the lung: Status post resection in 2015.  Hypocalcemia: Now resolved continue oral supplementation, intact PTH and vitamin D pending.  Asthma/COPD: Continue inhalers.  Rheumatoid arthritis: Continue leflunomide.  Normocytic anemia: B12 was 300 we will try to keep above 400 start oral supplementation.  Chronic diarrhea: Continue colestipol.  Hyperlipidemia: She is on statins.  Depression: Holding Effexor, she is only taken 1 dose encephalopathy.   DVT prophylaxis: lovenox Family Communication:none Status is: Inpatient  Remains inpatient  appropriate because: Acute CVA    Code Status:     Code Status Orders  (From admission, onward)           Start     Ordered   12/30/20 2032  Full code  Continuous        12/30/20 2032           Code Status History     Date Active Date Inactive Code Status Order ID Comments User Context   10/08/2016 1257 10/08/2016 1848 Full Code [id number]  Burnell Blanks, MD Inpatient   04/12/2013 1457 04/28/2013 1807 Full Code [id number]  Caryn Bee Inpatient   03/16/2013 1356 03/17/2013 0336 Full Code [id number]  Azzie Roup, MD HOV   03/01/2013 1234 03/02/2013 0333 Full Code [id number]  Markus Daft, MD HOV   03/01/2013 1234 03/01/2013 1234 Full Code [id number]  Markus Daft, MD HOV         IV Access:   Peripheral IV   Procedures and diagnostic studies:   MR ANGIO HEAD WO CONTRAST  Result Date: 01/02/2021 CLINICAL DATA:  Stroke follow-up EXAM: MRA HEAD WITHOUT CONTRAST TECHNIQUE: Angiographic images of the Circle of Willis were acquired using MRA technique without intravenous contrast. COMPARISON:  Brain MRI from yesterday FINDINGS: Anterior circulation: No major branch occlusion or flow limiting stenosis. Diffusely attenuated right A1 segment which appears developmental. Downward pointing outpouching from the distal right MCA measuring 2 mm. On source images there is a closely neighboring branch extending inferiorly from this level which cannot be resolved relative to the outpouching. Posterior circulation: The vertebral and basilar arteries are smoothly contoured and widely patent. No branch occlusion, beading, or aneurysm. Anatomic variants: As above IMPRESSION: 1. No emergent finding.  No  stenosis of the basilar. 2. 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 Electronically Signed   By: Jorje Guild M.D.   On: 01/02/2021 07:28   MR BRAIN WO CONTRAST  Result Date: 01/01/2021 CLINICAL DATA:  Word-finding difficulties and confusion EXAM: MRI HEAD WITHOUT CONTRAST  TECHNIQUE: Multiplanar, multiecho pulse sequences of the brain and surrounding structures were obtained without intravenous contrast. COMPARISON:  03/11/2008 MRI, correlation is also made with 12/30/2020 CT head FINDINGS: Brain: Small focus of increased signal on diffusion-weighted imaging with ADC correlate in the left pons (series 3, image 18 and series 300, image 18), most likely an acute to subacute infarct. No other diffusion-weighted signal abnormalities. No acute hemorrhage, mass, mass effect, or midline shift. Punctate foci of hemosiderin deposition in the right basal ganglia, left thalamus, right pons, and left cerebellum, likely sequela of remote hypertensive microhemorrhages. T2 hyperintense signal in the periventricular white matter, likely the sequela of chronic small vessel ischemic disease. No hydrocephalus or extra-axial collection. Vascular: Normal flow voids. Skull and upper cervical spine: Normal marrow signal. Sinuses/Orbits: Negative.  Status post bilateral lens replacements. Other: Fluid in left mastoid air cells. IMPRESSION: Punctate focus of restricted diffusion in the left pons, consistent with an acute to subacute infarct. 1. Electronically Signed   By: Merilyn Baba M.D.   On: 01/01/2021 18:57     Medical Consultants:   None.   Subjective:    Taylor Berry relates she still feels foggy.  No other complaints.  Objective:    Vitals:   01/01/21 0825 01/01/21 2014 01/02/21 0654 01/02/21 0749  BP: 110/72 133/80 130/76   Pulse: 70 82 98   Resp:      Temp:  98 F (36.7 C) 97.8 F (36.6 C)   TempSrc:  Oral Oral   SpO2:  95% 94% 95%  Weight:      Height:       SpO2: 95 %   Intake/Output Summary (Last 24 hours) at 01/02/2021 0850 Last data filed at 01/02/2021 0430 Gross per 24 hour  Intake --  Output 650 ml  Net -650 ml   Filed Weights   12/30/20 2042  Weight: 75.6 kg    Exam: General exam: In no acute distress. Respiratory system: Good air movement  and clear to auscultation. Cardiovascular system: S1 & S2 heard, RRR. No JVD. Gastrointestinal system: Abdomen is nondistended, soft and nontender.  Extremities: No pedal edema. Skin: No rashes, lesions or ulcers Psychiatry: Judgement and insight appear normal. Mood & affect appropriate.    Data Reviewed:    Labs: Basic Metabolic Panel: Recent Labs  Lab 12/30/20 1534 12/30/20 1540 12/30/20 1821 12/30/20 2051 12/31/20 0452 01/01/21 0344 01/02/21 0418  NA 127* 126* 126* 125* 126* 126* 127*  K 3.7 3.6 3.6 3.8 3.9 4.0 4.5  CL 96* 94*  --  93* 95* 94* 92*  CO2 24  --   --  23 23 24 27   GLUCOSE 112* 106*  --  119* 97 92 112*  BUN 15 16  --  15 11 13 18   CREATININE 0.75 0.60  --  0.68 0.71 0.78 1.06*  CALCIUM 8.7*  --   --  8.5* 8.1* 8.2* 9.5  MG  --   --   --  1.7  --   --   --   PHOS  --   --   --  2.4*  --   --   --    GFR Estimated Creatinine Clearance: 40.3 mL/min (A) (  by C-G formula based on SCr of 1.06 mg/dL (H)). Liver Function Tests: Recent Labs  Lab 12/30/20 1534  AST 25  ALT 12  ALKPHOS 38  BILITOT 0.8  PROT 6.6  ALBUMIN 3.2*   No results for input(s): LIPASE, AMYLASE in the last 168 hours. Recent Labs  Lab 12/30/20 2051  AMMONIA 16   Coagulation profile Recent Labs  Lab 12/30/20 1534  INR 1.0   COVID-19 Labs  No results for input(s): DDIMER, FERRITIN, LDH, CRP in the last 72 hours.  Lab Results  Component Value Date   [place] NEGATIVE 12/31/2020    CBC: Recent Labs  Lab 12/30/20 1534 12/30/20 1540 12/30/20 1821 12/31/20 0452  WBC 6.2  --   --  5.5  NEUTROABS 3.9  --   --   --   HGB 11.0* 12.2 11.6* 10.3*  HCT 33.7* 36.0 34.0* 30.4*  MCV 92.8  --   --  90.5  PLT 154  --   --  224   Cardiac Enzymes: No results for input(s): CKTOTAL, CKMB, CKMBINDEX, TROPONINI in the last 168 hours. BNP (last 3 results) No results for input(s): PROBNP in the last 8760 hours. CBG: Recent Labs  Lab 12/30/20 1528  GLUCAP 101*   D-Dimer: No  results for input(s): DDIMER in the last 72 hours. Hgb A1c: Recent Labs    01/02/21 0418  HGBA1C 5.4   Lipid Profile: Recent Labs    01/02/21 0418  CHOL 128  HDL 60  LDLCALC 58  TRIG 51  CHOLHDL 2.1   Thyroid function studies: Recent Labs    12/30/20 2051  TSH 2.696   Anemia work up: Recent Labs    12/30/20 2051  VITAMINB12 305   Sepsis Labs: Recent Labs  Lab 12/30/20 1534 12/31/20 0452  WBC 6.2 5.5   Microbiology Recent Results (from the past 240 hour(s))  Urine Culture     Status: Abnormal   Collection Time: 12/30/20  4:08 AM   Specimen: Urine, Clean Catch  Result Value Ref Range Status   Specimen Description URINE, CLEAN CATCH  Final   Special Requests   Final    NONE Performed at [facility], [place] [address] [place], [place] [address]    Culture (A)  Final    20,000 COLONIES/mL MULTIPLE SPECIES PRESENT, SUGGEST RECOLLECTION   Report Status 01/01/2021 FINAL  Final  Resp Panel by RT-PCR (Flu A&B, Covid) Nasopharyngeal Swab     Status: None   Collection Time: 12/31/20 12:50 AM   Specimen: Nasopharyngeal Swab; Nasopharyngeal(NP) swabs in vial transport medium  Result Value Ref Range Status   SARS Coronavirus 2 by RT PCR NEGATIVE NEGATIVE Final    Comment: (NOTE) SARS-CoV-2 target nucleic acids are NOT DETECTED.  The SARS-CoV-2 RNA is generally detectable in upper respiratory specimens during the acute phase of infection. The lowest concentration of SARS-CoV-2 viral copies this assay can detect is 138 copies/mL. A negative result does not preclude SARS-Cov-2 infection and should not be used as the sole basis for treatment or other patient management decisions. A negative result may occur with  improper specimen collection/handling, submission of specimen other than nasopharyngeal swab, presence of viral mutation(s) within the areas targeted by this assay, and inadequate number of viral copies(<138 copies/mL). A negative result must be combined  with clinical observations, patient history, and epidemiological information. The expected result is Negative.  Fact Sheet for Patients:  EntrepreneurPulse.com.au  Fact Sheet for Healthcare Providers:  IncredibleEmployment.be  This test is  no t yet approved or cleared by the [place] and  has been authorized for detection and/or diagnosis of SARS-CoV-2 by FDA under an Emergency Use Authorization (EUA). This EUA will remain  in effect (meaning this test can be used) for the duration of the COVID-19 declaration under Section 564(b)(1) of the Act, 21 U.S.C.section 360bbb-3(b)(1), unless the authorization is terminated  or revoked sooner.       Influenza A by PCR NEGATIVE NEGATIVE Final   Influenza B by PCR NEGATIVE NEGATIVE Final    Comment: (NOTE) The Xpert Xpress SARS-CoV-2/FLU/RSV plus assay is intended as an aid in the diagnosis of influenza from Nasopharyngeal swab specimens and should not be used as a sole basis for treatment. Nasal washings and aspirates are unacceptable for Xpert Xpress SARS-CoV-2/FLU/RSV testing.  Fact Sheet for Patients: EntrepreneurPulse.com.au  Fact Sheet for Healthcare Providers: IncredibleEmployment.be  This test is not yet approved or cleared by the [place] FDA and has been authorized for detection and/or diagnosis of SARS-CoV-2 by FDA under an Emergency Use Authorization (EUA). This EUA will remain in effect (meaning this test can be used) for the duration of the COVID-19 declaration under Section 564(b)(1) of the Act, 21 U.S.C. section 360bbb-3(b)(1), unless the authorization is terminated or revoked.  Performed at [facility], [place] [address] [place], [place] [address]      Medications:    albuterol  2.5 mg Inhalation See admin instructions   aspirin EC  81 mg Oral Daily   atorvastatin  40 mg Oral Daily   calcium carbonate  1 tablet Oral TID  WC   clopidogrel  75 mg Oral Daily   colestipol  2 g Oral BID   enoxaparin (LOVENOX) injection  40 mg Subcutaneous Q24H   famotidine  40 mg Oral QPM   umeclidinium bromide  1 puff Inhalation Daily   And   fluticasone furoate-vilanterol  1 puff Inhalation Daily   furosemide  40 mg Oral Daily   leflunomide  20 mg Oral Daily   metoprolol succinate  25 mg Oral q AM   psyllium  1 packet Oral Daily   sodium chloride flush  3 mL Intravenous Q12H   thiamine  500 mg Oral Daily   Continuous Infusions:  sodium chloride        LOS: 2 days   Charlynne Cousins  [facility]  01/02/2021, 8:50 AM

## 2021-01-02 NOTE — Progress Notes (Signed)
MRI brain came back positive for an acute left pontine lacunar stroke.   Will need complete stroke work up. Stroke team to see in the AM.   Continue ASA and atorvastatin. May need to be started on DAPT as she was on ASA at home. Will defer to AM stroke team regarding DAPT.  Electronically signed: Dr. Kerney Elbe

## 2021-01-02 NOTE — Progress Notes (Addendum)
STROKE TEAM PROGRESS NOTE   SUBJECTIVE (INTERVAL HISTORY) No family is at the bedside.  Overall her condition is gradually improving. She stated that her speech much improved but she still very tired and feeling dizzy with movement and whole body weakness. MRI showed small pontine infarct and PT/OT recommend HH PT/OT.    OBJECTIVE Temp:  [97.8 F (36.6 C)-98 F (36.7 C)] 97.8 F (36.6 C) (12/28 0654) Pulse Rate:  [82-98] 98 (12/28 0654) Cardiac Rhythm: Normal sinus rhythm;Heart block (12/28 0934) BP: (130-133)/(76-80) 130/76 (12/28 0654) SpO2:  [94 %-95 %] 95 % (12/28 0749)  Recent Labs  Lab 12/30/20 1528  GLUCAP 101*   Recent Labs  Lab 12/30/20 1534 12/30/20 1540 12/30/20 1821 12/30/20 2051 12/31/20 0452 01/01/21 0344 01/02/21 0418  NA 127* 126* 126* 125* 126* 126* 127*  K 3.7 3.6 3.6 3.8 3.9 4.0 4.5  CL 96* 94*  --  93* 95* 94* 92*  CO2 24  --   --  23 23 24 27   GLUCOSE 112* 106*  --  119* 97 92 112*  BUN 15 16  --  15 11 13 18   CREATININE 0.75 0.60  --  0.68 0.71 0.78 1.06*  CALCIUM 8.7*  --   --  8.5* 8.1* 8.2* 9.5  MG  --   --   --  1.7  --   --   --   PHOS  --   --   --  2.4*  --   --   --    Recent Labs  Lab 12/30/20 1534  AST 25  ALT 12  ALKPHOS 38  BILITOT 0.8  PROT 6.6  ALBUMIN 3.2*   Recent Labs  Lab 12/30/20 1534 12/30/20 1540 12/30/20 1821 12/31/20 0452  WBC 6.2  --   --  5.5  NEUTROABS 3.9  --   --   --   HGB 11.0* 12.2 11.6* 10.3*  HCT 33.7* 36.0 34.0* 30.4*  MCV 92.8  --   --  90.5  PLT 154  --   --  224   No results for input(s): CKTOTAL, CKMB, CKMBINDEX, TROPONINI in the last 168 hours. No results for input(s): LABPROT, INR in the last 72 hours. Recent Labs    12/31/20 0408  COLORURINE YELLOW  LABSPEC 1.020  PHURINE 6.0  GLUCOSEU NEGATIVE  HGBUR NEGATIVE  BILIRUBINUR NEGATIVE  KETONESUR NEGATIVE  PROTEINUR NEGATIVE  NITRITE NEGATIVE  LEUKOCYTESUR NEGATIVE       Component Value Date/Time   CHOL 128 01/02/2021 0418    TRIG 51 01/02/2021 0418   HDL 60 01/02/2021 0418   CHOLHDL 2.1 01/02/2021 0418   VLDL 10 01/02/2021 0418   LDLCALC 58 01/02/2021 0418   Lab Results  Component Value Date   HGBA1C 5.4 01/02/2021   No results found for: LABOPIA, COCAINSCRNUR, LABBENZ, AMPHETMU, THCU, LABBARB  No results for input(s): ETH in the last 168 hours.  I have personally reviewed the radiological images below and agree with the radiology interpretations.  MR ANGIO HEAD WO CONTRAST  Result Date: 01/02/2021 CLINICAL DATA:  Stroke follow-up EXAM: MRA HEAD WITHOUT CONTRAST TECHNIQUE: Angiographic images of the Circle of Willis were acquired using MRA technique without intravenous contrast. COMPARISON:  Brain MRI from yesterday FINDINGS: Anterior circulation: No major branch occlusion or flow limiting stenosis. Diffusely attenuated right A1 segment which appears developmental. Downward pointing outpouching from the distal right MCA measuring 2 mm. On source images there is a closely neighboring branch extending inferiorly from this  level which cannot be resolved relative to the outpouching. Posterior circulation: The vertebral and basilar arteries are smoothly contoured and widely patent. No branch occlusion, beading, or aneurysm. Anatomic variants: As above IMPRESSION: 1. No emergent finding.  No stenosis of the basilar. 2. 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 Electronically Signed   By: Jorje Guild M.D.   On: 01/02/2021 07:28   MR BRAIN WO CONTRAST  Result Date: 01/01/2021 CLINICAL DATA:  Word-finding difficulties and confusion EXAM: MRI HEAD WITHOUT CONTRAST TECHNIQUE: Multiplanar, multiecho pulse sequences of the brain and surrounding structures were obtained without intravenous contrast. COMPARISON:  03/11/2008 MRI, correlation is also made with 12/30/2020 CT head FINDINGS: Brain: Small focus of increased signal on diffusion-weighted imaging with ADC correlate in the left pons (series 3, image 18 and series 300,  image 18), most likely an acute to subacute infarct. No other diffusion-weighted signal abnormalities. No acute hemorrhage, mass, mass effect, or midline shift. Punctate foci of hemosiderin deposition in the right basal ganglia, left thalamus, right pons, and left cerebellum, likely sequela of remote hypertensive microhemorrhages. T2 hyperintense signal in the periventricular white matter, likely the sequela of chronic small vessel ischemic disease. No hydrocephalus or extra-axial collection. Vascular: Normal flow voids. Skull and upper cervical spine: Normal marrow signal. Sinuses/Orbits: Negative.  Status post bilateral lens replacements. Other: Fluid in left mastoid air cells. IMPRESSION: Punctate focus of restricted diffusion in the left pons, consistent with an acute to subacute infarct. 1. Electronically Signed   By: Merilyn Baba M.D.   On: 01/01/2021 18:57   DG CHEST PORT 1 VIEW  Result Date: 12/30/2020 CLINICAL DATA:  Altered mental status EXAM: PORTABLE CHEST 1 VIEW COMPARISON:  09/07/2020 CT FINDINGS: Cardiac shadow is mildly enlarged but accentuated by the frontal technique. Postsurgical changes and scar noted along the lateral aspect of the left lung stable from the prior exam. No focal infiltrate or effusion is seen. IMPRESSION: No acute abnormality noted. Electronically Signed   By: Inez Catalina M.D.   On: 12/30/2020 21:04   CT HEAD CODE STROKE WO CONTRAST  Result Date: 12/30/2020 CLINICAL DATA:  Code stroke.  Neuro deficit, acute, stroke suspected EXAM: CT HEAD WITHOUT CONTRAST TECHNIQUE: Contiguous axial images were obtained from the base of the skull through the vertex without intravenous contrast. COMPARISON:  MRI 03/11/2008 FINDINGS: Brain: No focal abnormality seen affecting the brainstem or cerebellum. Cerebral hemispheres show age related volume loss with moderate chronic small-vessel ischemic changes of the white matter. No sign of acute infarction, mass lesion, hemorrhage,  hydrocephalus or extra-axial collection. Vascular: There is atherosclerotic calcification of the major vessels at the base of the brain. Skull: Negative Sinuses/Orbits: Clear/normal Other: None ASPECTS ([place] Stroke Program Early CT Score) - Ganglionic level infarction (caudate, lentiform nuclei, internal capsule, insula, M1-M3 cortex): 7 - Supraganglionic infarction (M4-M6 cortex): 3 Total score (0-10 with 10 being normal): 10 IMPRESSION: 1. No acute finding. Chronic small-vessel ischemic changes of the cerebral hemispheric white matter. 2. ASPECTS is 10 3. These results were communicated to Dr. Theda Sers at 3:53 pm on 12/30/2020 by text page via the [facility] messaging system. Electronically Signed   By: Nelson Chimes M.D.   On: 12/30/2020 15:53     PHYSICAL EXAM  Temp:  [97.8 F (36.6 C)-98 F (36.7 C)] 97.8 F (36.6 C) (12/28 0654) Pulse Rate:  [82-98] 98 (12/28 0654) BP: (130-133)/(76-80) 130/76 (12/28 0654) SpO2:  [94 %-95 %] 95 % (12/28 0749)  General - Well nourished, well developed, in  no apparent distress, mildly lethargic.  Ophthalmologic - fundi not visualized due to noncooperation.  Cardiovascular - Regular rhythm and rate.  Mental Status -  Level of arousal and orientation to time, place, and person were intact. Language including expression, naming, repetition, comprehension was assessed and found intact.  Cranial Nerves II - XII - II - Visual field intact OU. III, IV, VI - Extraocular movements intact. V - Facial sensation intact bilaterally. VII - Facial movement intact bilaterally. VIII - Hearing & vestibular intact bilaterally except left gaze unsustained horizontal nystagmus X - Palate elevates symmetrically. XI - Chin turning & shoulder shrug intact bilaterally. XII - Tongue protrusion intact.  Motor Strength - The patients strength was symmetric in all extremities and pronator drift was absent.  Bulk was normal and fasciculations were absent.   Motor Tone - Muscle  tone was assessed at the neck and appendages and was normal.  Reflexes - The patients reflexes were symmetrical in all extremities and she had no pathological reflexes.  Sensory - Light touch, temperature/pinprick were assessed and were symmetrical.    Coordination - The patient had normal movements in the hands with no ataxia or dysmetria.  Tremor was absent.  Gait and Station - deferred.   ASSESSMENT/PLAN Ms. CAILAH REACH is a 80 y.o. female with history of COPE, HTN, HLD, OSA, lung cancer s/p Sx admitted for word finding difficulty. No tPA given due to outside window.    Stroke:  left pontine small infarct likely secondary to small vessel disease source CT no acute finding MRI  left small pontine infarct MRA head and neck unremarkable 2D Echo EF 45-50% LDL 58 HgbA1c 5.4 lovenox for VTE prophylaxis aspirin 81 mg daily prior to admission, now on aspirin 81 mg daily and clopidogrel 75 mg daily DAPT for 3 weeks and then plavix alone.  Patient counseled to be compliant with her antithrombotic medications Ongoing aggressive stroke risk factor management Therapy recommendations:  HH PT Disposition:  pending  Hypertension Stable Permissive hypertension (OK if <220/120) for 24-48 hours post stroke and then gradually normalized within 5-7 days. Long term BP goal normotensive  Hyperlipidemia Home meds:  lipitor 40  LDL 58, goal < 70 Now on lipitor 40 Continue statin at discharge  Other Stroke Risk Factors Advanced age Obesity, Body mass index is 30.47 kg/m.  Obstructive sleep apnea, on CPAP at home  Other Active Problems COPD Lung cancer s/p Sx RA  Hospital day # 2  Neurology will sign off. Please call with questions. Pt will follow up with stroke clinic NP at [facility] in about 4 weeks. Thanks for the consult.   Rosalin Hawking, MD PhD Stroke Neurology 01/02/2021 3:42 PM    To contact Stroke Continuity provider, please refer to [email or web address] After hours, contact General  Neurology

## 2021-01-02 NOTE — Progress Notes (Addendum)
Physical Therapy Treatment Patient Details Name: Taylor Berry MRN: [id number] DOB: January 13, 1940 Today's Date: 01/02/2021   History of Present Illness Pt is an 80 y/o female admitted 12/25 secondary to expressive aphasia and encephalopathy. Per notes, possibly secondary to symptomatic hyponatremia. 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 PMH includes HTN, lung cancer, RA, COPD, and uterine cancer.    PT Comments    Pt with noted decline in balance and cognition this date, demonstrating deficits in safety awareness and memory, impulsively coming to stand and having LOB bout (no fall though) even though acknowledging cues to wait for PT a moment before. Pt relying on 1-2 UE support for stability with mobility this date. Educated pt and her son on recommendation for frequent/constant supervision/assistance and use of her rollator for all standing mobility due to her being at risk for falls and injuries due to deficits mentioned above. Pt's son reports they are going to try to arrange help for pt at home. If pt will have the level of care required at home, then continue to recommend d/c home with HHPT follow-up. If not, may need to pursue other rehab options pending pt's progression. Will continue to follow acutely.   Recommendations for follow up therapy are one component of a multi-disciplinary discharge planning process, led by the attending physician.  Recommendations may be updated based on patient status, additional functional criteria and insurance authorization.  Follow Up Recommendations  Home health PT     Assistance Recommended at Discharge Frequent or constant Supervision/Assistance  Equipment Recommendations  None recommended by PT    Recommendations for Other Services       Precautions / Restrictions Precautions Precautions: Fall Restrictions Weight Bearing Restrictions: No     Mobility  Bed Mobility               General bed mobility comments: Pt  sitting on commode upon arrival.    Transfers Overall transfer level: Needs assistance Equipment used: None Transfers: Sit to/from Stand Sit to Stand: Min guard           General transfer comment: Pt impulsive to come to stand from commode even though just cued to wait for PT to donn gloves, pt placing hand on ground as she had difficulty powering up to stand steadily when impulsively trying to stand without PT. No fall occurred, pt cued to sit on commode again for safety. Min guard for safety with slow, unsteady rise to stand from commode and then again from EOB.    Ambulation/Gait Ambulation/Gait assistance: Min guard Gait Distance (Feet): 63 Feet (x2 bouts of ~15 ft > ~63 ft) Assistive device: None;Rolling walker (2 wheels);1 person hand held assist Gait Pattern/deviations: Step-through pattern;Decreased stride length Gait velocity: Decreased Gait velocity interpretation: <1.31 ft/sec, indicative of household ambulator   General Gait Details: Pt reaching out for objects in room to support self on frequently, thus obtained RW for hallway mobility. Pt needing cues to remain within and proximal to RW, no LOB but unsteadiness noted, min guard for safety. Pt fatigues quickly.   Stairs             Wheelchair Mobility    Modified Rankin (Stroke Patients Only) Modified Rankin (Stroke Patients Only) Pre-Morbid Rankin Score: No symptoms Modified Rankin: Moderately severe disability     Balance Overall balance assessment: Needs assistance Sitting-balance support: No upper extremity supported;Feet supported Sitting balance-Leahy Scale: Good     Standing balance support: Single extremity supported;Bilateral upper extremity supported;No upper extremity  supported Standing balance-Leahy Scale: Fair Standing balance comment: Able to stand without UE support and take a few steps but preferring 1-2 UE support for mobility with unsteadiness noted this date.                             Cognition Arousal/Alertness: Awake/alert Behavior During Therapy: Agitated;Impulsive Overall Cognitive Status: Impaired/Different from baseline Area of Impairment: Memory;Safety/judgement;Attention;Following commands                   Current Attention Level: Selective Memory: Decreased short-term memory Following Commands: Follows one step commands with increased time Safety/Judgement: Decreased awareness of safety;Decreased awareness of deficits     General Comments: Pt with STM deficits, standing up even though acknowledging cues moments before from PT to stay seated until PT was ready. Poor insight into deficits and safety and easily agitated when asked questions this date. Son and pt report the agitation tends to happen when she is on steroids.        Exercises      General Comments General comments (skin integrity, edema, etc.): Reported lightheaded sensation when standing with systolic BP decreasing from 130s to 120s, but otherwise stable      Pertinent Vitals/Pain Pain Assessment: Faces Faces Pain Scale: No hurt Pain Intervention(s): Monitored during session    Home Living                          Prior Function            PT Goals (current goals can now be found in the care plan section) Acute Rehab PT Goals Patient Stated Goal: to get better PT Goal Formulation: With patient/family Time For Goal Achievement: 01/14/21 Potential to Achieve Goals: Good Progress towards PT goals: Progressing toward goals    Frequency    Min 4X/week      PT Plan Discharge plan needs to be updated;Frequency needs to be updated    Co-evaluation              AM-PAC PT "6 Clicks" Mobility   Outcome Measure  Help needed turning from your back to your side while in a flat bed without using bedrails?: A Little Help needed moving from lying on your back to sitting on the side of a flat bed without using bedrails?: A Little Help needed  moving to and from a bed to a chair (including a wheelchair)?: A Little Help needed standing up from a chair using your arms (e.g., wheelchair or bedside chair)?: A Little Help needed to walk in hospital room?: A Little Help needed climbing 3-5 steps with a railing? : A Little 6 Click Score: 18    End of Session   Activity Tolerance: Patient limited by fatigue Patient left: in bed;with call bell/phone within reach;with family/visitor present (with OT and MD both entering) Nurse Communication: Mobility status (to NT) PT Visit Diagnosis: Unsteadiness on feet (R26.81);Muscle weakness (generalized) (M62.81);Other abnormalities of gait and mobility (R26.89);Difficulty in walking, not elsewhere classified (R26.2)     Time: 0263-7858 PT Time Calculation (min) (ACUTE ONLY): 20 min  Charges:  $Therapeutic Activity: 8-22 mins                     Moishe Spice, PT, DPT Acute Rehabilitation Services  Pager: [phone] Office: [place] 01/02/2021, 12:54 PM

## 2021-01-03 ENCOUNTER — Other Ambulatory Visit (HOSPITAL_COMMUNITY): Payer: Self-pay

## 2021-01-03 DIAGNOSIS — J449 Chronic obstructive pulmonary disease, unspecified: Secondary | ICD-10-CM | POA: Diagnosis not present

## 2021-01-03 DIAGNOSIS — I6330 Cerebral infarction due to thrombosis of unspecified cerebral artery: Secondary | ICD-10-CM

## 2021-01-03 DIAGNOSIS — G9340 Encephalopathy, unspecified: Secondary | ICD-10-CM | POA: Diagnosis not present

## 2021-01-03 DIAGNOSIS — E871 Hypo-osmolality and hyponatremia: Secondary | ICD-10-CM | POA: Diagnosis not present

## 2021-01-03 LAB — BASIC METABOLIC PANEL
Anion gap: 9 (ref 5–15)
BUN: 18 mg/dL (ref 8–23)
CO2: 28 mmol/L (ref 22–32)
Calcium: 10.1 mg/dL (ref 8.9–10.3)
Chloride: 93 mmol/L — ABNORMAL LOW (ref 98–111)
Creatinine, Ser: 0.97 mg/dL (ref 0.44–1.00)
GFR, Estimated: 59 mL/min — ABNORMAL LOW (ref >60)
Glucose, Bld: 111 mg/dL — ABNORMAL HIGH (ref 70–99)
Potassium: 3.8 mmol/L (ref 3.5–5.1)
Sodium: 130 mmol/L — ABNORMAL LOW (ref 135–145)

## 2021-01-03 MED ORDER — CLOPIDOGREL BISULFATE 75 MG PO TABS
75.0000 mg | ORAL_TABLET | Freq: Every day | ORAL | 3 refills | Status: DC
  Filled 2021-01-03: qty 30, 30d supply, fill #0

## 2021-01-03 MED ORDER — FUROSEMIDE 40 MG PO TABS
20.0000 mg | ORAL_TABLET | Freq: Every day | ORAL | 0 refills | Status: DC
  Filled 2021-01-03: qty 30, 60d supply, fill #0

## 2021-01-03 MED ORDER — ASPIRIN 81 MG PO TBEC
81.0000 mg | DELAYED_RELEASE_TABLET | Freq: Every day | ORAL | 11 refills | Status: AC
Start: 2021-01-04 — End: 2021-02-02
  Filled 2021-01-03: qty 30, 30d supply, fill #0

## 2021-01-03 MED ORDER — ATORVASTATIN CALCIUM 40 MG PO TABS
40.0000 mg | ORAL_TABLET | Freq: Every day | ORAL | 3 refills | Status: DC
  Filled 2021-01-03: qty 30, 30d supply, fill #0

## 2021-01-03 MED ORDER — VITAMIN B-12 1000 MCG PO TABS
1000.0000 ug | ORAL_TABLET | Freq: Every day | ORAL | 0 refills | Status: DC
  Filled 2021-01-03: qty 30, 30d supply, fill #0

## 2021-01-03 NOTE — Discharge Summary (Signed)
Physician Discharge Summary  Taylor Berry IHK:[id number] DOB: 03-Dec-1940 DOA: 12/30/2020  PCP: Kelton Pillar, MD  Admit date: 12/30/2020 Discharge date: 01/03/2021  Admitted From: home Disposition:  Home  Recommendations for Outpatient Follow-up:  Follow up with Neurology in 1-2 weeks Please obtain BMP/CBC in one week   Home Health:Yes Equipment/Devices:None  Discharge Condition:Stable CODE STATUS:Full Diet recommendation: Heart Healthy   Brief/Interim Summary: 80 y.o. female past medical history of COPD, lung cancer status post wedge resection, obstructive sleep apnea, rheumatoid arthritis, nonischemic cardiomyopathy, essential hypertension uterine cancer status post hysterectomy depression comes into the ED Christmas Day with word finding difficulties.  CT of the head was negative for acute finding  Discharge Diagnoses:  Principal Problem:   Encephalopathy acute/expressive aphasia  Active Problems:   Essential (primary) hypertension   COPD (chronic obstructive pulmonary disease) (HCC)   OSA (obstructive sleep apnea)   Lung cancer, left upper lobe   Non-ischemic cardiomyopathy/diastolic and systolic CHF   Rheumatoid arthritis (HCC)   non obstructive CAD   Hyponatremia   GERD (gastroesophageal reflux disease)   Hypocalcemia   Cerebral thrombosis with cerebral infarction  Acute punctuated left pontine CVA: A1c of 5.4. MRI showed brain CVA. Neurology was consulted recommended an MRA that showed no significant obstruction. They also recommended for her to go home on DAPT therapy for 3 weeks, then she will continue Plavix alone. She will resume her home antihypertensive medications and outpatient. Physical therapy evaluated the patient recommended to go home with home health PT.  Hyponatremia: Nephrology was consulted concerned about SIADH, she was placed on low-dose Lasix and fluid restriction and her sodium improved. She will follow-up with PCP in 1 week recheck  a basic metabolic panel.  Nonischemic cardiomyopathy: Continue metoprolol and Lasix.  History of squamous cell carcinoma of the lung: Status post resection in 2015.  Hypocalcemia: Continue oral supplementation of PCP to follow-up intact PTH and vitamin D.  Asthma/COPD: Continue inhalers.  Rheumatoid arthritis: Continue leflunomide.  Normocytic anemia: B12 was 300, she will continue oral supplementation as an outpatient.  Chronic diarrhea: Continue colestipol.  Hyperlipidemia:  continue statins.  Depression: Will discontinue Effexor.  PCP can resume it as an outpatient.  Discharge Instructions  Discharge Instructions     Ambulatory referral to Neurology   Complete by: As directed    Follow up with stroke clinic NP (Jessica Vanschaick or Cecille Rubin, if both not available, consider Zachery Dauer, or Ahern) at [facility] in about 4 weeks. Thanks.   Diet - low sodium heart healthy   Complete by: As directed    Increase activity slowly   Complete by: As directed       Allergies as of 01/03/2021       Reactions   Latex Itching, Rash   Ace Inhibitors Cough   Angiotensin Receptor Blockers Swelling, Other (See Comments)   Tongue swelling   Fentanyl Other (See Comments)   Hallucinations "I go crazy"     Hydrochlorothiazide Other (See Comments)   Decreased Sodium   Hydroxychloroquine Other (See Comments)   "Decreased sodium"   Penicillins Other (See Comments)   Yeast infections Has patient had a PCN reaction causing immediate rash, facial/tongue/throat swelling, SOB or lightheadedness with hypotension:No Has patient had a PCN reaction causing severe rash involving mucus membranes or skin necrosis: No Has patient had a PCN reaction that required hospitalization No Has patient had a PCN reaction occurring within the last 10 years: Yes If all of the above answers are "NO", then may  proceed with Cephalosporin use. Other reaction(s): yeast infection        Medication  List     TAKE these medications    albuterol 108 (90 Base) MCG/ACT inhaler Commonly known as: VENTOLIN HFA INHALE 2 PUFFS BY MOUTH IN EACH NOSTRIL EVERY 4 TO 6 HOURS AS NEEDED What changed:  how much to take how to take this when to take this additional instructions   AMBULATORY NON FORMULARY MEDICATION Take 1 capsule by mouth See admin instructions. Synogut probiotic capsules- Take 1 capsule by mouth in the morning and at bedtime   aspirin 81 MG EC tablet Take 1 tablet (81 mg total) by mouth daily for 21 days. Swallow whole. Start taking on: January 04, 2021 What changed: additional instructions   atorvastatin 40 MG tablet Commonly known as: LIPITOR Take 1 tablet (40 mg total) by mouth daily. Start taking on: January 04, 2021 What changed: See the new instructions.   CALCIUM-VITAMIN D PO Take 1 tablet by mouth daily.   candesartan 8 MG tablet Commonly known as: ATACAND TAKE 1 TABLET(8 MG) BY MOUTH DAILY What changed: See the new instructions.   clopidogrel 75 MG tablet Commonly known as: PLAVIX Take 1 tablet (75 mg total) by mouth daily. Start taking on: January 04, 2021   colestipol 1 g tablet Commonly known as: COLESTID Take 2 tablets (2 g total) by mouth 2 (two) times daily.   denosumab 60 MG/ML Sosy injection Commonly known as: PROLIA Inject 60 mg into the skin every 6 (six) months.   famotidine 40 MG tablet Commonly known as: PEPCID Take 40 mg by mouth every evening.   fluticasone 50 MCG/ACT nasal spray Commonly known as: FLONASE Place 1-2 sprays into both nostrils daily as needed for allergies or rhinitis.   Fluticasone-Umeclidin-Vilant 100-62.5-25 MCG/ACT Aepb Commonly known as: Trelegy Ellipta Inhale 1 puff into the lungs daily.   furosemide 40 MG tablet Commonly known as: LASIX Take 0.5 tablets (20 mg total) by mouth daily. Start taking on: January 04, 2021   lansoprazole 30 MG capsule Commonly known as: PREVACID Take 1 capsule (30 mg  total) by mouth every morning. What changed:  when to take this reasons to take this   leflunomide 20 MG tablet Commonly known as: ARAVA Take 20 mg by mouth daily.   METAMUCIL FIBER PO Take by mouth See admin instructions. Mix 1 tablespoonful of powder into 4-8 ounces of desired beverage and drink once a day   metoprolol succinate 25 MG 24 hr tablet Commonly known as: TOPROL-XL TAKE 1 TABLET(25 MG) BY MOUTH DAILY What changed: See the new instructions.   predniSONE 5 MG tablet Commonly known as: DELTASONE Take 5 mg by mouth daily as needed (AS DIRECTED- for R.A. flares).   venlafaxine XR 75 MG 24 hr capsule Commonly known as: EFFEXOR-XR Take 75 mg by mouth daily with breakfast.               Durable Medical Equipment  (From admission, onward)           Start     Ordered   01/02/21 1335  For home use only DME Walker rolling  Once       Question Answer Comment  Walker: With [employer] Whe[employer]   Patient needs a walker to treat with the following condition Weakness      01/02/21 1335            Follow-up Information     [facility] Follow up.  Specialty: Home Health Services Why: Also known as [facility]. They will call you in 1-2 days to set up your home health services Contact information: [place] [address] [phone]         [facility]. Schedule an appointment as soon as possible for a visit in 1 month(s).   Specialty: Neurology Why: stroke clinic Contact information: [place] [address] [phone]               Allergies  Allergen Reactions   Latex Itching and Rash   Ace Inhibitors Cough   Angiotensin Receptor Blockers Swelling and Other (See Comments)    Tongue swelling   Fentanyl Other (See Comments)    Hallucinations "I go crazy"      Hydrochlorothiazide Other (See Comments)    Decreased Sodium   Hydroxychloroquine Other (See  Comments)    "Decreased sodium"   Penicillins Other (See Comments)    Yeast infections Has patient had a PCN reaction causing immediate rash, facial/tongue/throat swelling, SOB or lightheadedness with hypotension:No Has patient had a PCN reaction causing severe rash involving mucus membranes or skin necrosis: No Has patient had a PCN reaction that required hospitalization No Has patient had a PCN reaction occurring within the last 10 years: Yes If all of the above answers are "NO", then may proceed with Cephalosporin use.  Other reaction(s): yeast infection    Consultations: Neurology Nephrology   Procedures/Studies: MR ANGIO HEAD WO CONTRAST  Result Date: 01/02/2021 CLINICAL DATA:  Stroke follow-up EXAM: MRA HEAD WITHOUT CONTRAST TECHNIQUE: Angiographic images of the Circle of Willis were acquired using MRA technique without intravenous contrast. COMPARISON:  Brain MRI from yesterday FINDINGS: Anterior circulation: No major branch occlusion or flow limiting stenosis. Diffusely attenuated right A1 segment which appears developmental. Downward pointing outpouching from the distal right MCA measuring 2 mm. On source images there is a closely neighboring branch extending inferiorly from this level which cannot be resolved relative to the outpouching. Posterior circulation: The vertebral and basilar arteries are smoothly contoured and widely patent. No branch occlusion, beading, or aneurysm. Anatomic variants: As above IMPRESSION: 1. No emergent finding.  No stenosis of the basilar. 2. 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 Electronically Signed   By: Jorje Guild M.D.   On: 01/02/2021 07:28   MR MRA NECK W CONTRAST  Result Date: 01/02/2021 CLINICAL DATA:  Stroke EXAM: MRA NECK WITHOUT AND WITH CONTRAST TECHNIQUE: Multiplanar and multiecho pulse sequences of the neck were obtained without and with intravenous contrast. Angiographic images of the neck were obtained using MRA technique  without and with intravenous contrast. CONTRAST:  74mL GADAVIST GADOBUTROL 1 MMOL/ML IV SOLN COMPARISON:  None. FINDINGS: Aortic arch: Three-vessel arch. No evidence of dissection, aneurysm, or occlusion. Right carotid system: Poor visualization of the origin of the right common carotid artery, secondary to artifact. The remainder of the right common and internal carotid arteries appear patent, without significant stenosis (greater than 50%, occlusion, or dissection. Left carotid system: The left common carotid artery and internal carotid artery are patent from their origin to the terminus. Vertebral arteries: Poor visualization of the origin of the left vertebral artery. No evidence of stenosis (greater than 50%), dissection, or aneurysm. IMPRESSION: No hemodynamically significant stenosis in the neck. Electronically Signed   By: Merilyn Baba M.D.   On: 01/02/2021 16:06   MR BRAIN WO CONTRAST  Result Date: 01/01/2021 CLINICAL DATA:  Word-finding difficulties and confusion EXAM:  MRI HEAD WITHOUT CONTRAST TECHNIQUE: Multiplanar, multiecho pulse sequences of the brain and surrounding structures were obtained without intravenous contrast. COMPARISON:  03/11/2008 MRI, correlation is also made with 12/30/2020 CT head FINDINGS: Brain: Small focus of increased signal on diffusion-weighted imaging with ADC correlate in the left pons (series 3, image 18 and series 300, image 18), most likely an acute to subacute infarct. No other diffusion-weighted signal abnormalities. No acute hemorrhage, mass, mass effect, or midline shift. Punctate foci of hemosiderin deposition in the right basal ganglia, left thalamus, right pons, and left cerebellum, likely sequela of remote hypertensive microhemorrhages. T2 hyperintense signal in the periventricular white matter, likely the sequela of chronic small vessel ischemic disease. No hydrocephalus or extra-axial collection. Vascular: Normal flow voids. Skull and upper cervical spine:  Normal marrow signal. Sinuses/Orbits: Negative.  Status post bilateral lens replacements. Other: Fluid in left mastoid air cells. IMPRESSION: Punctate focus of restricted diffusion in the left pons, consistent with an acute to subacute infarct. 1. Electronically Signed   By: Merilyn Baba M.D.   On: 01/01/2021 18:57   DG CHEST PORT 1 VIEW  Result Date: 12/30/2020 CLINICAL DATA:  Altered mental status EXAM: PORTABLE CHEST 1 VIEW COMPARISON:  09/07/2020 CT FINDINGS: Cardiac shadow is mildly enlarged but accentuated by the frontal technique. Postsurgical changes and scar noted along the lateral aspect of the left lung stable from the prior exam. No focal infiltrate or effusion is seen. IMPRESSION: No acute abnormality noted. Electronically Signed   By: Inez Catalina M.D.   On: 12/30/2020 21:04   ECHOCARDIOGRAM COMPLETE  Result Date: 01/02/2021    ECHOCARDIOGRAM REPORT   Patient Name:   Taylor Berry Date of Exam: 01/02/2021 Medical Rec #:  [id number]        Height:       62.0 in Accession #:    [id number]       Weight:       166.6 lb Date of Birth:  23-Sep-1940        BSA:          1.769 m Patient Age:    32 years         BP:           130/76 mmHg Patient Gender: F                HR:           69 bpm. Exam Location:  Inpatient Procedure: 2D Echo, 3D Echo, Cardiac Doppler and Color Doppler Indications:    Stroke  History:        Patient has prior history of Echocardiogram examinations, most                 recent 06/21/2020. Cardiomyopathy and CHF, CAD, Pulmonary HTN and                 COPD, Signs/Symptoms:Edema, Shortness of Breath and Dyspnea;                 Risk Factors:Sleep Apnea and Hypertension. Cancer. ETOH.  Sonographer:    Roseanna Rainbow RDCS Referring Phys: [phone] JARED M GARDNER  Sonographer Comments: Technically difficult study due to poor echo windows. Image acquisition challenging due to patient body habitus and Image acquisition challenging due to COPD. IMPRESSIONS  1. Moderate septal hypertrophy  with otherwise mild concentric LVH. Anteroseptal and inferoseptal hypokinesis. Left ventricular ejection fraction, by estimation, is 45 to 50%. The left ventricle has mildly decreased function. The left ventricle  demonstrates regional wall motion abnormalities (see scoring diagram/findings for description). There is moderate left ventricular hypertrophy. Left ventricular diastolic parameters are consistent with Grade I diastolic dysfunction (impaired relaxation).  2. Right ventricular systolic function is normal. The right ventricular size is normal. There is normal pulmonary artery systolic pressure.  3. The mitral valve is normal in structure. Trivial mitral valve regurgitation. No evidence of mitral stenosis.  4. The aortic valve is tricuspid. There is mild calcification of the aortic valve. There is mild thickening of the aortic valve. Aortic valve regurgitation is not visualized. No aortic stenosis is present.  5. The inferior vena cava is normal in size with greater than 50% respiratory variability, suggesting right atrial pressure of 3 mmHg. FINDINGS  Left Ventricle: Moderate septal hypertrophy with otherwise mild concentric LVH. Anteroseptal and inferoseptal hypokinesis. Left ventricular ejection fraction, by estimation, is 45 to 50%. The left ventricle has mildly decreased function. The left ventricle demonstrates regional wall motion abnormalities. The left ventricular internal cavity size was normal in size. There is moderate left ventricular hypertrophy. Left ventricular diastolic parameters are consistent with Grade I diastolic dysfunction (impaired relaxation). Indeterminate filling pressures. Right Ventricle: The right ventricular size is normal. No increase in right ventricular wall thickness. Right ventricular systolic function is normal. There is normal pulmonary artery systolic pressure. The tricuspid regurgitant velocity is 1.40 m/s, and  with an assumed right atrial pressure of 3 mmHg, the  estimated right ventricular systolic pressure is 37.3 mmHg. Left Atrium: Left atrial size was normal in size. Right Atrium: Right atrial size was normal in size. Pericardium: There is no evidence of pericardial effusion. Mitral Valve: The mitral valve is normal in structure. Mild mitral annular calcification. Trivial mitral valve regurgitation. No evidence of mitral valve stenosis. Tricuspid Valve: The tricuspid valve is normal in structure. Tricuspid valve regurgitation is trivial. No evidence of tricuspid stenosis. Aortic Valve: The aortic valve is tricuspid. There is mild calcification of the aortic valve. There is mild thickening of the aortic valve. Aortic valve regurgitation is not visualized. No aortic stenosis is present. Pulmonic Valve: The pulmonic valve was normal in structure. Pulmonic valve regurgitation is trivial. No evidence of pulmonic stenosis. Aorta: The aortic root is normal in size and structure. Venous: The inferior vena cava is normal in size with greater than 50% respiratory variability, suggesting right atrial pressure of 3 mmHg. IAS/Shunts: No atrial level shunt detected by color flow Doppler.  LEFT VENTRICLE PLAX 2D LVIDd:         4.00 cm     Diastology LVIDs:         3.35 cm     LV e' medial:    5.08 cm/s LV PW:         1.20 cm     LV E/e' medial:  9.8 LV IVS:        1.50 cm     LV e' lateral:   4.81 cm/s LVOT diam:     2.40 cm     LV E/e' lateral: 10.3 LV SV:         83 LV SV Index:   47 LVOT Area:     4.52 cm                             3D Volume EF: LV Volumes (MOD)           3D EF:        41 %  LV vol d, MOD A2C: 81.0 ml LV EDV:       101 ml LV vol d, MOD A4C: 65.2 ml LV ESV:       60 ml LV vol s, MOD A2C: 50.3 ml LV SV:        42 ml LV vol s, MOD A4C: 32.4 ml LV SV MOD A2C:     30.7 ml LV SV MOD A4C:     65.2 ml LV SV MOD BP:      30.0 ml RIGHT VENTRICLE             IVC RV S prime:     12.90 cm/s  IVC diam: 1.30 cm TAPSE (M-mode): 2.1 cm LEFT ATRIUM             Index        RIGHT  ATRIUM          Index LA diam:        3.65 cm 2.06 cm/m   RA Area:     9.66 cm LA Vol (A2C):   40.4 ml 22.84 ml/m  RA Volume:   17.70 ml 10.01 ml/m LA Vol (A4C):   19.1 ml 10.80 ml/m LA Biplane Vol: 28.5 ml 16.11 ml/m  AORTIC VALVE LVOT Vmax:   108.00 cm/s LVOT Vmean:  66.600 cm/s LVOT VTI:    0.184 m  AORTA Ao Root diam: 3.30 cm Ao Asc diam:  3.60 cm MITRAL VALVE                TRICUSPID VALVE MV Area (PHT): 3.27 cm     TR Peak grad:   7.8 mmHg MV Decel Time: 232 msec     TR Vmax:        140.00 cm/s MV E velocity: 49.70 cm/s MV A velocity: 110.00 cm/s  SHUNTS MV E/A ratio:  0.45         Systemic VTI:  0.18 m                             Systemic Diam: 2.40 cm Skeet Latch MD Electronically signed by Skeet Latch MD Signature Date/Time: 01/02/2021/4:52:56 PM    Final    CT HEAD CODE STROKE WO CONTRAST  Result Date: 12/30/2020 CLINICAL DATA:  Code stroke.  Neuro deficit, acute, stroke suspected EXAM: CT HEAD WITHOUT CONTRAST TECHNIQUE: Contiguous axial images were obtained from the base of the skull through the vertex without intravenous contrast. COMPARISON:  MRI 03/11/2008 FINDINGS: Brain: No focal abnormality seen affecting the brainstem or cerebellum. Cerebral hemispheres show age related volume loss with moderate chronic small-vessel ischemic changes of the white matter. No sign of acute infarction, mass lesion, hemorrhage, hydrocephalus or extra-axial collection. Vascular: There is atherosclerotic calcification of the major vessels at the base of the brain. Skull: Negative Sinuses/Orbits: Clear/normal Other: None ASPECTS ([place] Stroke Program Early CT Score) - Ganglionic level infarction (caudate, lentiform nuclei, internal capsule, insula, M1-M3 cortex): 7 - Supraganglionic infarction (M4-M6 cortex): 3 Total score (0-10 with 10 being normal): 10 IMPRESSION: 1. No acute finding. Chronic small-vessel ischemic changes of the cerebral hemispheric white matter. 2. ASPECTS is 10 3. These results  were communicated to Dr. Theda Sers at 3:53 pm on 12/30/2020 by text page via the [facility] messaging system. Electronically Signed   By: Nelson Chimes M.D.   On: 12/30/2020 15:53   (Echo, Carotid, EGD, Colonoscopy, ERCP)    Subjective: No complaints  Discharge Exam:  Vitals:   01/03/21 0447 01/03/21 0845  BP: 126/71   Pulse: 70   Resp: 17   Temp: (!) 97.5 F (36.4 C)   SpO2: 97% 96%   Vitals:   01/02/21 0749 01/02/21 2227 01/03/21 0447 01/03/21 0845  BP:  (!) 149/75 126/71   Pulse:  68 70   Resp:  17 17   Temp:  97.6 F (36.4 C) (!) 97.5 F (36.4 C)   TempSrc:  Oral Oral   SpO2: 95% 96% 97% 96%  Weight:      Height:        General: Pt is alert, awake, not in acute distress Cardiovascular: RRR, S1/S2 +, no rubs, no gallops Respiratory: CTA bilaterally, no wheezing, no rhonchi Abdominal: Soft, NT, ND, bowel sounds + Extremities: no edema, no cyanosis    The results of significant diagnostics from this hospitalization (including imaging, microbiology, ancillary and laboratory) are listed below for reference.     Microbiology: Recent Results (from the past 240 hour(s))  Urine Culture     Status: Abnormal   Collection Time: 12/30/20  4:08 AM   Specimen: Urine, Clean Catch  Result Value Ref Range Status   Specimen Description URINE, CLEAN CATCH  Final   Special Requests   Final    NONE Performed at [facility], [place] [address], [place], [place] [address]    Culture (A)  Final    20,000 COLONIES/mL MULTIPLE SPECIES PRESENT, SUGGEST RECOLLECTION   Report Status 01/01/2021 FINAL  Final  Resp Panel by RT-PCR (Flu A&B, Covid) Nasopharyngeal Swab     Status: None   Collection Time: 12/31/20 12:50 AM   Specimen: Nasopharyngeal Swab; Nasopharyngeal(NP) swabs in vial transport medium  Result Value Ref Range Status   SARS Coronavirus 2 by RT PCR NEGATIVE NEGATIVE Final    Comment: (NOTE) SARS-CoV-2 target nucleic acids are NOT DETECTED.  The SARS-CoV-2 RNA is generally  detectable in upper respiratory specimens during the acute phase of infection. The lowest concentration of SARS-CoV-2 viral copies this assay can detect is 138 copies/mL. A negative result does not preclude SARS-Cov-2 infection and should not be used as the sole basis for treatment or other patient management decisions. A negative result may occur with  improper specimen collection/handling, submission of specimen other than nasopharyngeal swab, presence of viral mutation(s) within the areas targeted by this assay, and inadequate number of viral copies(<138 copies/mL). A negative result must be combined with clinical observations, patient history, and epidemiological information. The expected result is Negative.  Fact Sheet for Patients:  EntrepreneurPulse.com.au  Fact Sheet for Healthcare Providers:  IncredibleEmployment.be  This test is no t yet approved or cleared by the [place] FDA and  has been authorized for detection and/or diagnosis of SARS-CoV-2 by FDA under an Emergency Use Authorization (EUA). This EUA will remain  in effect (meaning this test can be used) for the duration of the COVID-19 declaration under Section 564(b)(1) of the Act, 21 U.S.C.section 360bbb-3(b)(1), unless the authorization is terminated  or revoked sooner.       Influenza A by PCR NEGATIVE NEGATIVE Final   Influenza B by PCR NEGATIVE NEGATIVE Final    Comment: (NOTE) The Xpert Xpress SARS-CoV-2/FLU/RSV plus assay is intended as an aid in the diagnosis of influenza from Nasopharyngeal swab specimens and should not be used as a sole basis for treatment. Nasal washings and aspirates are unacceptable for Xpert Xpress SARS-CoV-2/FLU/RSV testing.  Fact Sheet for Patients: EntrepreneurPulse.com.au  Fact Sheet for Healthcare  Providers: IncredibleEmployment.be  This test is not yet approved or cleared by the [place]  and has been authorized for detection and/or diagnosis of SARS-CoV-2 by FDA under an Emergency Use Authorization (EUA). This EUA will remain in effect (meaning this test can be used) for the duration of the COVID-19 declaration under Section 564(b)(1) of the Act, 21 U.S.C. section 360bbb-3(b)(1), unless the authorization is terminated or revoked.  Performed at [facility], [place] [address] [place], [place] [address]      Labs: BNP (last 3 results) No results for input(s): BNP in the last 8760 hours. Basic Metabolic Panel: Recent Labs  Lab 12/30/20 2051 12/31/20 0452 01/01/21 0344 01/02/21 0418 01/03/21 0112  NA 125* 126* 126* 127* 130*  K 3.8 3.9 4.0 4.5 3.8  CL 93* 95* 94* 92* 93*  CO2 23 23 24 27 28   GLUCOSE 119* 97 92 112* 111*  BUN 15 11 13 18 18   CREATININE 0.68 0.71 0.78 1.06* 0.97  CALCIUM 8.5*   8.8 8.1* 8.2* 9.5 10.1  MG 1.7  --   --   --   --   PHOS 2.4*  --   --   --   --    Liver Function Tests: Recent Labs  Lab 12/30/20 1534  AST 25  ALT 12  ALKPHOS 38  BILITOT 0.8  PROT 6.6  ALBUMIN 3.2*   No results for input(s): LIPASE, AMYLASE in the last 168 hours. Recent Labs  Lab 12/30/20 2051  AMMONIA 16   CBC: Recent Labs  Lab 12/30/20 1534 12/30/20 1540 12/30/20 1821 12/31/20 0452  WBC 6.2  --   --  5.5  NEUTROABS 3.9  --   --   --   HGB 11.0* 12.2 11.6* 10.3*  HCT 33.7* 36.0 34.0* 30.4*  MCV 92.8  --   --  90.5  PLT 154  --   --  224   Cardiac Enzymes: No results for input(s): CKTOTAL, CKMB, CKMBINDEX, TROPONINI in the last 168 hours. BNP: Invalid input(s): POCBNP CBG: Recent Labs  Lab 12/30/20 1528  GLUCAP 101*   D-Dimer No results for input(s): DDIMER in the last 72 hours. Hgb A1c Recent Labs    01/02/21 0418  HGBA1C 5.4   Lipid Profile Recent Labs    01/02/21 0418  CHOL 128  HDL 60  LDLCALC 58  TRIG 51  CHOLHDL 2.1   Thyroid function studies No results for input(s): TSH, T4TOTAL, T3FREE, THYROIDAB in the  last 72 hours.  Invalid input(s): FREET3 Anemia work up No results for input(s): VITAMINB12, FOLATE, FERRITIN, TIBC, IRON, RETICCTPCT in the last 72 hours. Urinalysis    Component Value Date/Time   COLORURINE YELLOW 12/31/2020 0408   APPEARANCEUR CLEAR 12/31/2020 0408   LABSPEC 1.020 12/31/2020 0408   PHURINE 6.0 12/31/2020 0408   GLUCOSEU NEGATIVE 12/31/2020 0408   HGBUR NEGATIVE 12/31/2020 0408   BILIRUBINUR NEGATIVE 12/31/2020 0408   KETONESUR NEGATIVE 12/31/2020 0408   PROTEINUR NEGATIVE 12/31/2020 0408   UROBILINOGEN 0.2 04/11/2013 1459   NITRITE NEGATIVE 12/31/2020 0408   LEUKOCYTESUR NEGATIVE 12/31/2020 0408   Sepsis Labs Invalid input(s): PROCALCITONIN,  WBC,  LACTICIDVEN Microbiology Recent Results (from the past 240 hour(s))  Urine Culture     Status: Abnormal   Collection Time: 12/30/20  4:08 AM   Specimen: Urine, Clean Catch  Result Value Ref Range Status   Specimen Description URINE, CLEAN CATCH  Final   Special Requests   Final  NONE Performed at [facility], [place] [address] [place], [place] [address]    Culture (A)  Final    20,000 COLONIES/mL MULTIPLE SPECIES PRESENT, SUGGEST RECOLLECTION   Report Status 01/01/2021 FINAL  Final  Resp Panel by RT-PCR (Flu A&B, Covid) Nasopharyngeal Swab     Status: None   Collection Time: 12/31/20 12:50 AM   Specimen: Nasopharyngeal Swab; Nasopharyngeal(NP) swabs in vial transport medium  Result Value Ref Range Status   SARS Coronavirus 2 by RT PCR NEGATIVE NEGATIVE Final    Comment: (NOTE) SARS-CoV-2 target nucleic acids are NOT DETECTED.  The SARS-CoV-2 RNA is generally detectable in upper respiratory specimens during the acute phase of infection. The lowest concentration of SARS-CoV-2 viral copies this assay can detect is 138 copies/mL. A negative result does not preclude SARS-Cov-2 infection and should not be used as the sole basis for treatment or other patient management decisions. A negative result may  occur with  improper specimen collection/handling, submission of specimen other than nasopharyngeal swab, presence of viral mutation(s) within the areas targeted by this assay, and inadequate number of viral copies(<138 copies/mL). A negative result must be combined with clinical observations, patient history, and epidemiological information. The expected result is Negative.  Fact Sheet for Patients:  EntrepreneurPulse.com.au  Fact Sheet for Healthcare Providers:  IncredibleEmployment.be  This test is no t yet approved or cleared by the [place] FDA and  has been authorized for detection and/or diagnosis of SARS-CoV-2 by FDA under an Emergency Use Authorization (EUA). This EUA will remain  in effect (meaning this test can be used) for the duration of the COVID-19 declaration under Section 564(b)(1) of the Act, 21 U.S.C.section 360bbb-3(b)(1), unless the authorization is terminated  or revoked sooner.       Influenza A by PCR NEGATIVE NEGATIVE Final   Influenza B by PCR NEGATIVE NEGATIVE Final    Comment: (NOTE) The Xpert Xpress SARS-CoV-2/FLU/RSV plus assay is intended as an aid in the diagnosis of influenza from Nasopharyngeal swab specimens and should not be used as a sole basis for treatment. Nasal washings and aspirates are unacceptable for Xpert Xpress SARS-CoV-2/FLU/RSV testing.  Fact Sheet for Patients: EntrepreneurPulse.com.au  Fact Sheet for Healthcare Providers: IncredibleEmployment.be  This test is not yet approved or cleared by the [place] FDA and has been authorized for detection and/or diagnosis of SARS-CoV-2 by FDA under an Emergency Use Authorization (EUA). This EUA will remain in effect (meaning this test can be used) for the duration of the COVID-19 declaration under Section 564(b)(1) of the Act, 21 U.S.C. section 360bbb-3(b)(1), unless the authorization is terminated  or revoked.  Performed at [facility], [place] [address], [place], [place] [address]      Time coordinating discharge: Over 30 minutes  SIGNED:   Charlynne Cousins, MD  [facility] 01/03/2021, 10:01 AM Pager   If 7PM-7AM, please contact night-coverage [email or web address] Password [email or web address]

## 2021-01-03 NOTE — Progress Notes (Signed)
[facility] PROGRESS NOTE    Progress Note  Taylor Berry  HBZ:[id number] DOB: Nov 12, 1940 DOA: 12/30/2020 PCP: Kelton Pillar, MD     Brief Narrative:   Taylor Berry is an 80 y.o. female past medical history of COPD, lung cancer status post wedge resection, obstructive sleep apnea, rheumatoid arthritis, nonischemic cardiomyopathy, essential hypertension uterine cancer status post hysterectomy depression comes into the ED Christmas Day with word finding difficulties.  CT of the head was negative for acute finding   Assessment/Plan:   Acute punctuated left pontine CVA: HgbA1c 5.4, fasting lipid panel HDL greater than 40 LDL less than 70, she is currently on a statin. MRI of the brain showed CVA. Will need to continue DAPT therapy for 3 weeks then Plavix alone. Transthoracic Echo, pending 45 to 50% BP goal: permissive HTN upto 220/120 mmHg Telemetry monitoring See to help with home health PT. patient has no one to take care of her at home.  She still dizzy especially when she walks. We will asked TOC to help with skilled nursing facility placement.  Hyponatremia: Concern about SIADH, serum osmolarity of 261 She was placed on fluid restriction and Lasix. Her sodium is slowly improving fluid restriction and Lasix.  Nonischemic cardiomyopathy: Continue metoprolol and Lasix  History of squamous cell carcinoma of the lung: Status post resection in 2015.  Hypocalcemia: Now resolved continue oral supplementation, intact PTH and vitamin D pending.  Asthma/COPD: Continue inhalers.  Rheumatoid arthritis: Continue leflunomide.  Normocytic anemia: B12 was 300 we will try to keep above 400 start oral supplementation.  Chronic diarrhea: Continue colestipol.  Hyperlipidemia: She is on statins.  Depression: Holding Effexor, she is only taken 1 dose encephalopathy.   DVT prophylaxis: lovenox Family Communication:none Status is: Inpatient  Remains inpatient  appropriate because: Acute CVA    Code Status:     Code Status Orders  (From admission, onward)           Start     Ordered   12/30/20 2032  Full code  Continuous        12/30/20 2032           Code Status History     Date Active Date Inactive Code Status Order ID Comments User Context   10/08/2016 1257 10/08/2016 1848 Full Code [id number]  Burnell Blanks, MD Inpatient   04/12/2013 1457 04/28/2013 1807 Full Code [id number]  Caryn Bee Inpatient   03/16/2013 1356 03/17/2013 0336 Full Code [id number]  Azzie Roup, MD HOV   03/01/2013 1234 03/02/2013 0333 Full Code [id number]  Markus Daft, MD HOV   03/01/2013 1234 03/01/2013 1234 Full Code [id number]  Markus Daft, MD HOV         IV Access:   Peripheral IV   Procedures and diagnostic studies:   MR ANGIO HEAD WO CONTRAST  Result Date: 01/02/2021 CLINICAL DATA:  Stroke follow-up EXAM: MRA HEAD WITHOUT CONTRAST TECHNIQUE: Angiographic images of the Circle of Willis were acquired using MRA technique without intravenous contrast. COMPARISON:  Brain MRI from yesterday FINDINGS: Anterior circulation: No major branch occlusion or flow limiting stenosis. Diffusely attenuated right A1 segment which appears developmental. Downward pointing outpouching from the distal right MCA measuring 2 mm. On source images there is a closely neighboring branch extending inferiorly from this level which cannot be resolved relative to the outpouching. Posterior circulation: The vertebral and basilar arteries are smoothly contoured and widely patent. No branch occlusion, beading, or aneurysm. Anatomic variants: As above IMPRESSION: 1. No  emergent finding.  No stenosis of the basilar. 2. 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 Electronically Signed   By: Jorje Guild M.D.   On: 01/02/2021 07:28   MR MRA NECK W CONTRAST  Result Date: 01/02/2021 CLINICAL DATA:  Stroke EXAM: MRA NECK WITHOUT AND WITH CONTRAST TECHNIQUE: Multiplanar and  multiecho pulse sequences of the neck were obtained without and with intravenous contrast. Angiographic images of the neck were obtained using MRA technique without and with intravenous contrast. CONTRAST:  70mL GADAVIST GADOBUTROL 1 MMOL/ML IV SOLN COMPARISON:  None. FINDINGS: Aortic arch: Three-vessel arch. No evidence of dissection, aneurysm, or occlusion. Right carotid system: Poor visualization of the origin of the right common carotid artery, secondary to artifact. The remainder of the right common and internal carotid arteries appear patent, without significant stenosis (greater than 50%, occlusion, or dissection. Left carotid system: The left common carotid artery and internal carotid artery are patent from their origin to the terminus. Vertebral arteries: Poor visualization of the origin of the left vertebral artery. No evidence of stenosis (greater than 50%), dissection, or aneurysm. IMPRESSION: No hemodynamically significant stenosis in the neck. Electronically Signed   By: Merilyn Baba M.D.   On: 01/02/2021 16:06   MR BRAIN WO CONTRAST  Result Date: 01/01/2021 CLINICAL DATA:  Word-finding difficulties and confusion EXAM: MRI HEAD WITHOUT CONTRAST TECHNIQUE: Multiplanar, multiecho pulse sequences of the brain and surrounding structures were obtained without intravenous contrast. COMPARISON:  03/11/2008 MRI, correlation is also made with 12/30/2020 CT head FINDINGS: Brain: Small focus of increased signal on diffusion-weighted imaging with ADC correlate in the left pons (series 3, image 18 and series 300, image 18), most likely an acute to subacute infarct. No other diffusion-weighted signal abnormalities. No acute hemorrhage, mass, mass effect, or midline shift. Punctate foci of hemosiderin deposition in the right basal ganglia, left thalamus, right pons, and left cerebellum, likely sequela of remote hypertensive microhemorrhages. T2 hyperintense signal in the periventricular white matter, likely the  sequela of chronic small vessel ischemic disease. No hydrocephalus or extra-axial collection. Vascular: Normal flow voids. Skull and upper cervical spine: Normal marrow signal. Sinuses/Orbits: Negative.  Status post bilateral lens replacements. Other: Fluid in left mastoid air cells. IMPRESSION: Punctate focus of restricted diffusion in the left pons, consistent with an acute to subacute infarct. 1. Electronically Signed   By: Merilyn Baba M.D.   On: 01/01/2021 18:57   ECHOCARDIOGRAM COMPLETE  Result Date: 01/02/2021    ECHOCARDIOGRAM REPORT   Patient Name:   Taylor Berry Date of Exam: 01/02/2021 Medical Rec #:  [id number]        Height:       62.0 in Accession #:    [id number]       Weight:       166.6 lb Date of Birth:  Sep 27, 1940        BSA:          1.769 m Patient Age:    38 years         BP:           130/76 mmHg Patient Gender: F                HR:           69 bpm. Exam Location:  Inpatient Procedure: 2D Echo, 3D Echo, Cardiac Doppler and Color Doppler Indications:    Stroke  History:        Patient has prior history of Echocardiogram examinations, most  recent 06/21/2020. Cardiomyopathy and CHF, CAD, Pulmonary HTN and                 COPD, Signs/Symptoms:Edema, Shortness of Breath and Dyspnea;                 Risk Factors:Sleep Apnea and Hypertension. Cancer. ETOH.  Sonographer:    Roseanna Rainbow RDCS Referring Phys: [id number] JARED M GARDNER  Sonographer Comments: Technically difficult study due to poor echo windows. Image acquisition challenging due to patient body habitus and Image acquisition challenging due to COPD. IMPRESSIONS  1. Moderate septal hypertrophy with otherwise mild concentric LVH. Anteroseptal and inferoseptal hypokinesis. Left ventricular ejection fraction, by estimation, is 45 to 50%. The left ventricle has mildly decreased function. The left ventricle demonstrates regional wall motion abnormalities (see scoring diagram/findings for description). There is moderate left  ventricular hypertrophy. Left ventricular diastolic parameters are consistent with Grade I diastolic dysfunction (impaired relaxation).  2. Right ventricular systolic function is normal. The right ventricular size is normal. There is normal pulmonary artery systolic pressure.  3. The mitral valve is normal in structure. Trivial mitral valve regurgitation. No evidence of mitral stenosis.  4. The aortic valve is tricuspid. There is mild calcification of the aortic valve. There is mild thickening of the aortic valve. Aortic valve regurgitation is not visualized. No aortic stenosis is present.  5. The inferior vena cava is normal in size with greater than 50% respiratory variability, suggesting right atrial pressure of 3 mmHg. FINDINGS  Left Ventricle: Moderate septal hypertrophy with otherwise mild concentric LVH. Anteroseptal and inferoseptal hypokinesis. Left ventricular ejection fraction, by estimation, is 45 to 50%. The left ventricle has mildly decreased function. The left ventricle demonstrates regional wall motion abnormalities. The left ventricular internal cavity size was normal in size. There is moderate left ventricular hypertrophy. Left ventricular diastolic parameters are consistent with Grade I diastolic dysfunction (impaired relaxation). Indeterminate filling pressures. Right Ventricle: The right ventricular size is normal. No increase in right ventricular wall thickness. Right ventricular systolic function is normal. There is normal pulmonary artery systolic pressure. The tricuspid regurgitant velocity is 1.40 m/s, and  with an assumed right atrial pressure of 3 mmHg, the estimated right ventricular systolic pressure is 71.2 mmHg. Left Atrium: Left atrial size was normal in size. Right Atrium: Right atrial size was normal in size. Pericardium: There is no evidence of pericardial effusion. Mitral Valve: The mitral valve is normal in structure. Mild mitral annular calcification. Trivial mitral valve  regurgitation. No evidence of mitral valve stenosis. Tricuspid Valve: The tricuspid valve is normal in structure. Tricuspid valve regurgitation is trivial. No evidence of tricuspid stenosis. Aortic Valve: The aortic valve is tricuspid. There is mild calcification of the aortic valve. There is mild thickening of the aortic valve. Aortic valve regurgitation is not visualized. No aortic stenosis is present. Pulmonic Valve: The pulmonic valve was normal in structure. Pulmonic valve regurgitation is trivial. No evidence of pulmonic stenosis. Aorta: The aortic root is normal in size and structure. Venous: The inferior vena cava is normal in size with greater than 50% respiratory variability, suggesting right atrial pressure of 3 mmHg. IAS/Shunts: No atrial level shunt detected by color flow Doppler.  LEFT VENTRICLE PLAX 2D LVIDd:         4.00 cm     Diastology LVIDs:         3.35 cm     LV e' medial:    5.08 cm/s LV PW:  1.20 cm     LV E/e' medial:  9.8 LV IVS:        1.50 cm     LV e' lateral:   4.81 cm/s LVOT diam:     2.40 cm     LV E/e' lateral: 10.3 LV SV:         83 LV SV Index:   47 LVOT Area:     4.52 cm                             3D Volume EF: LV Volumes (MOD)           3D EF:        41 % LV vol d, MOD A2C: 81.0 ml LV EDV:       101 ml LV vol d, MOD A4C: 65.2 ml LV ESV:       60 ml LV vol s, MOD A2C: 50.3 ml LV SV:        42 ml LV vol s, MOD A4C: 32.4 ml LV SV MOD A2C:     30.7 ml LV SV MOD A4C:     65.2 ml LV SV MOD BP:      30.0 ml RIGHT VENTRICLE             IVC RV S prime:     12.90 cm/s  IVC diam: 1.30 cm TAPSE (M-mode): 2.1 cm LEFT ATRIUM             Index        RIGHT ATRIUM          Index LA diam:        3.65 cm 2.06 cm/m   RA Area:     9.66 cm LA Vol (A2C):   40.4 ml 22.84 ml/m  RA Volume:   17.70 ml 10.01 ml/m LA Vol (A4C):   19.1 ml 10.80 ml/m LA Biplane Vol: 28.5 ml 16.11 ml/m  AORTIC VALVE LVOT Vmax:   108.00 cm/s LVOT Vmean:  66.600 cm/s LVOT VTI:    0.184 m  AORTA Ao Root diam: 3.30  cm Ao Asc diam:  3.60 cm MITRAL VALVE                TRICUSPID VALVE MV Area (PHT): 3.27 cm     TR Peak grad:   7.8 mmHg MV Decel Time: 232 msec     TR Vmax:        140.00 cm/s MV E velocity: 49.70 cm/s MV A velocity: 110.00 cm/s  SHUNTS MV E/A ratio:  0.45         Systemic VTI:  0.18 m                             Systemic Diam: 2.40 cm Skeet Latch MD Electronically signed by Skeet Latch MD Signature Date/Time: 01/02/2021/4:52:56 PM    Final      Medical Consultants:   None.   Subjective:    Taylor Berry dizzy upon standing, concerned that she cannot go home.  Objective:    Vitals:   01/02/21 0749 01/02/21 2227 01/03/21 0447 01/03/21 0845  BP:  (!) 149/75 126/71   Pulse:  68 70   Resp:  17 17   Temp:  97.6 F (36.4 C) (!) 97.5 F (36.4 C)   TempSrc:  Oral Oral   SpO2: 95% 96% 97% 96%  Weight:      Height:       SpO2: 96 %   Intake/Output Summary (Last 24 hours) at 01/03/2021 0935 Last data filed at 01/02/2021 1900 Gross per 24 hour  Intake --  Output 500 ml  Net -500 ml    Filed Weights   12/30/20 2042  Weight: 75.6 kg    Exam: General exam: In no acute distress. Respiratory system: Good air movement and clear to auscultation. Cardiovascular system: S1 & S2 heard, RRR. No JVD. Gastrointestinal system: Abdomen is nondistended, soft and nontender.  Extremities: No pedal edema. Skin: No rashes, lesions or ulcers Psychiatry: Judgement and insight appear normal. Mood & affect appropriate.   Data Reviewed:    Labs: Basic Metabolic Panel: Recent Labs  Lab 12/30/20 2051 12/31/20 0452 01/01/21 0344 01/02/21 0418 01/03/21 0112  NA 125* 126* 126* 127* 130*  K 3.8 3.9 4.0 4.5 3.8  CL 93* 95* 94* 92* 93*  CO2 23 23 24 27 28   GLUCOSE 119* 97 92 112* 111*  BUN 15 11 13 18 18   CREATININE 0.68 0.71 0.78 1.06* 0.97  CALCIUM 8.5*   8.8 8.1* 8.2* 9.5 10.1  MG 1.7  --   --   --   --   PHOS 2.4*  --   --   --   --     GFR Estimated Creatinine  Clearance: 44 mL/min (by C-G formula based on SCr of 0.97 mg/dL). Liver Function Tests: Recent Labs  Lab 12/30/20 1534  AST 25  ALT 12  ALKPHOS 38  BILITOT 0.8  PROT 6.6  ALBUMIN 3.2*    No results for input(s): LIPASE, AMYLASE in the last 168 hours. Recent Labs  Lab 12/30/20 2051  AMMONIA 16    Coagulation profile Recent Labs  Lab 12/30/20 1534  INR 1.0    COVID-19 Labs  No results for input(s): DDIMER, FERRITIN, LDH, CRP in the last 72 hours.  Lab Results  Component Value Date   [place] NEGATIVE 12/31/2020    CBC: Recent Labs  Lab 12/30/20 1534 12/30/20 1540 12/30/20 1821 12/31/20 0452  WBC 6.2  --   --  5.5  NEUTROABS 3.9  --   --   --   HGB 11.0* 12.2 11.6* 10.3*  HCT 33.7* 36.0 34.0* 30.4*  MCV 92.8  --   --  90.5  PLT 154  --   --  224    Cardiac Enzymes: No results for input(s): CKTOTAL, CKMB, CKMBINDEX, TROPONINI in the last 168 hours. BNP (last 3 results) No results for input(s): PROBNP in the last 8760 hours. CBG: Recent Labs  Lab 12/30/20 1528  GLUCAP 101*    D-Dimer: No results for input(s): DDIMER in the last 72 hours. Hgb A1c: Recent Labs    01/02/21 0418  HGBA1C 5.4    Lipid Profile: Recent Labs    01/02/21 0418  CHOL 128  HDL 60  LDLCALC 58  TRIG 51  CHOLHDL 2.1    Thyroid function studies: No results for input(s): TSH, T4TOTAL, T3FREE, THYROIDAB in the last 72 hours.  Invalid input(s): FREET3  Anemia work up: No results for input(s): VITAMINB12, FOLATE, FERRITIN, TIBC, IRON, RETICCTPCT in the last 72 hours.  Sepsis Labs: Recent Labs  Lab 12/30/20 1534 12/31/20 0452  WBC 6.2 5.5    Microbiology Recent Results (from the past 240 hour(s))  Urine Culture     Status: Abnormal   Collection Time: 12/30/20  4:08 AM   Specimen: Urine, Clean  Catch  Result Value Ref Range Status   Specimen Description URINE, CLEAN CATCH  Final   Special Requests   Final    NONE Performed at [facility],  [address], [place], [place] [address]    Culture (A)  Final    20,000 COLONIES/mL MULTIPLE SPECIES PRESENT, SUGGEST RECOLLECTION   Report Status 01/01/2021 FINAL  Final  Resp Panel by RT-PCR (Flu A&B, Covid) Nasopharyngeal Swab     Status: None   Collection Time: 12/31/20 12:50 AM   Specimen: Nasopharyngeal Swab; Nasopharyngeal(NP) swabs in vial transport medium  Result Value Ref Range Status   SARS Coronavirus 2 by RT PCR NEGATIVE NEGATIVE Final    Comment: (NOTE) SARS-CoV-2 target nucleic acids are NOT DETECTED.  The SARS-CoV-2 RNA is generally detectable in upper respiratory specimens during the acute phase of infection. The lowest concentration of SARS-CoV-2 viral copies this assay can detect is 138 copies/mL. A negative result does not preclude SARS-Cov-2 infection and should not be used as the sole basis for treatment or other patient management decisions. A negative result may occur with  improper specimen collection/handling, submission of specimen other than nasopharyngeal swab, presence of viral mutation(s) within the areas targeted by this assay, and inadequate number of viral copies(<138 copies/mL). A negative result must be combined with clinical observations, patient history, and epidemiological information. The expected result is Negative.  Fact Sheet for Patients:  EntrepreneurPulse.com.au  Fact Sheet for Healthcare Providers:  IncredibleEmployment.be  This test is no t yet approved or cleared by the [place] FDA and  has been authorized for detection and/or diagnosis of SARS-CoV-2 by FDA under an Emergency Use Authorization (EUA). This EUA will remain  in effect (meaning this test can be used) for the duration of the COVID-19 declaration under Section 564(b)(1) of the Act, 21 U.S.C.section 360bbb-3(b)(1), unless the authorization is terminated  or revoked sooner.       Influenza A by PCR NEGATIVE NEGATIVE Final    Influenza B by PCR NEGATIVE NEGATIVE Final    Comment: (NOTE) The Xpert Xpress SARS-CoV-2/FLU/RSV plus assay is intended as an aid in the diagnosis of influenza from Nasopharyngeal swab specimens and should not be used as a sole basis for treatment. Nasal washings and aspirates are unacceptable for Xpert Xpress SARS-CoV-2/FLU/RSV testing.  Fact Sheet for Patients: EntrepreneurPulse.com.au  Fact Sheet for Healthcare Providers: IncredibleEmployment.be  This test is not yet approved or cleared by the [place] FDA and has been authorized for detection and/or diagnosis of SARS-CoV-2 by FDA under an Emergency Use Authorization (EUA). This EUA will remain in effect (meaning this test can be used) for the duration of the COVID-19 declaration under Section 564(b)(1) of the Act, 21 U.S.C. section 360bbb-3(b)(1), unless the authorization is terminated or revoked.  Performed at [facility], [place] [address], [place], [place] [address]      Medications:    albuterol  2.5 mg Inhalation See admin instructions   aspirin EC  81 mg Oral Daily   atorvastatin  40 mg Oral Daily   calcium carbonate  1 tablet Oral TID WC   clopidogrel  75 mg Oral Daily   colestipol  2 g Oral BID   enoxaparin (LOVENOX) injection  40 mg Subcutaneous Q24H   famotidine  40 mg Oral QPM   umeclidinium bromide  1 puff Inhalation Daily   And   fluticasone furoate-vilanterol  1 puff Inhalation Daily   furosemide  40 mg Oral Daily   leflunomide  20 mg Oral Daily   metoprolol succinate  25 mg Oral q AM   psyllium  1 packet Oral Daily   sodium chloride flush  3 mL Intravenous Q12H   thiamine  500 mg Oral Daily   Continuous Infusions:  sodium chloride        LOS: 3 days   Charlynne Cousins  [facility]  01/03/2021, 9:35 AM

## 2021-01-03 NOTE — TOC Transition Note (Addendum)
Transition of Care [facility]) - CM/SW Discharge Note   Patient Details  Name: SKARLETT SEDLACEK MRN: [id number] Date of Birth: [birth year]  Transition of Care [facility]) CM/SW Contact:  Bethena Roys, RN Phone Number: 01/03/2021, 10:23 AM   Clinical Narrative:  Case Manager attempted to call the son regarding home health needs and to make him aware  that the patient will transition home today. Latricia Heft is aware that the patient will transition home today. Per note patient has durable medical equipment needed for home. No further needs identified at this time.   01-03-21 1053 Case Manager received call from the son and Case Manager was able to speak with the patient. Patient states she will not be able to stay alone and will need assistance in the home. Case Manager asked if the patient could go to her sons and she is unable to do so. Patient states her home is small and that he will not be able to stay with her. Case Manager discussed personal care services with the family and the patient is willing to speak with [facility] Liaison to see if they can arrange services in the home. Case Manager awaiting phone call back regarding discussion.   [place] 01-03-21 [facility] Liaison to meet with the family to sign the contract for personal care services seven days a week from 8:00 am-2:00 pm and per liaison, the son will be available thereafter. Case Manager ordered a bedside commode for home. No further needs identified at this time.   Final next level of care: [place] Barriers to Discharge: No Barriers Identified   Patient Goals and CMS Choice Patient states their goals for this hospitalization and ongoing recovery are:: to go home CMS Medicare.gov Compare Post Acute Care list provided to:: Patient Choice offered to / list presented to : Patient   Discharge Plan and Services   Discharge Planning Services: CM Consult Post Acute Care Choice: Home Health, Durable  Medical Equipment          DME Arranged: Walker rolling DME Agency: [employer] Date DME Agency Contacted: 01/02/21 Time DME Agency Contacted: [phone]   HH Arranged: PT, OT, Speech Therapy HH Agency: [place] Date [place]: 01/02/21 Time [place]: [phone] Representative spoke with at [place]: Amy  Readmission Risk Interventions No flowsheet data found.

## 2021-01-03 NOTE — Progress Notes (Signed)
Physical Therapy Treatment Patient Details Name: Taylor Berry MRN: [id number] DOB: May 26, 1940 Today's Date: 01/03/2021   History of Present Illness Pt is an 80 y/o female admitted 12/25 secondary to expressive aphasia and encephalopathy. Per notes, possibly secondary to symptomatic hyponatremia. 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 PMH includes HTN, lung cancer, RA, COPD, and uterine cancer.    PT Comments    Pt is visibly upset on entry, reports that insurance will not pay for SNF and she is very fearful about falling as she has increased dizziness after the stroke. Pt reports that son is setting up some assistance at home and that she can not afford it. Pt is also upset that her son will be spending the night with her for the first couple nights after discharge. Pt is then able transfer with supervision and ambulate with min guard using RW. Pt with increase in HR to 130s and complaints of fatigue, but no complaints fo dizziness. Encouraged patient to go home with the Aide and work with HHPT to gain her independence back. Pt has already had RW delivered to room.   Recommendations for follow up therapy are one component of a multi-disciplinary discharge planning process, led by the attending physician.  Recommendations may be updated based on patient status, additional functional criteria and insurance authorization.  Follow Up Recommendations  Home health PT     Assistance Recommended at Discharge Intermittent Supervision/Assistance  Equipment Recommendations  None recommended by PT;Rolling walker (2 wheels)       Precautions / Restrictions Precautions Precautions: Fall Required Braces or Orthoses: Other Brace Other Brace: has R 5th digit splint due to arthritic deformity Restrictions Weight Bearing Restrictions: No     Mobility  Bed Mobility               General bed mobility comments: sitting EOB on entry    Transfers Overall transfer level: Needs  assistance Equipment used: None Transfers: Sit to/from Stand Sit to Stand: Supervision                Ambulation/Gait Ambulation/Gait assistance: Min guard Gait Distance (Feet): 80 Feet Assistive device: Rolling walker (2 wheels) Gait Pattern/deviations: Step-through pattern;Decreased stride length Gait velocity: Decreased Gait velocity interpretation: <1.31 ft/sec, indicative of household ambulator   General Gait Details: min guard for safety, increased cuing for proximity to RW for safety,       Modified Rankin (Stroke Patients Only) Modified Rankin (Stroke Patients Only) Pre-Morbid Rankin Score: No symptoms Modified Rankin: Moderately severe disability     Balance Overall balance assessment: Needs assistance Sitting-balance support: No upper extremity supported;Feet supported Sitting balance-Leahy Scale: Good     Standing balance support: Bilateral upper extremity supported;No upper extremity supported;During functional activity Standing balance-Leahy Scale: Fair Standing balance comment: able to mobilize short distance without AD, occasioanl reaching out for support (likely for confidence rather than balance)                            Cognition Arousal/Alertness: Awake/alert Behavior During Therapy: WFL for tasks assessed/performed Overall Cognitive Status: Impaired/Different from baseline Area of Impairment: Awareness;Memory                     Memory: Decreased short-term memory     Awareness: Emergent   General Comments: has awareness of her deficits however can not make direct connection between use of RW and increased stability  Exercises      General Comments General comments (skin integrity, edema, etc.): Increased HR with mobility to low 130s, fatigues quickly, pt reports that the fatigue is new since the stroke      Pertinent Vitals/Pain Pain Assessment: No/denies pain     PT Goals (current goals can now be  found in the care plan section) Acute Rehab PT Goals PT Goal Formulation: With patient/family Time For Goal Achievement: 01/14/21 Potential to Achieve Goals: Good Progress towards PT goals: Progressing toward goals    Frequency    Min 4X/week      PT Plan Equipment recommendations need to be updated       AM-PAC PT "6 Clicks" Mobility   Outcome Measure  Help needed turning from your back to your side while in a flat bed without using bedrails?: A Little Help needed moving from lying on your back to sitting on the side of a flat bed without using bedrails?: A Little Help needed moving to and from a bed to a chair (including a wheelchair)?: A Little Help needed standing up from a chair using your arms (e.g., wheelchair or bedside chair)?: A Little Help needed to walk in hospital room?: A Little Help needed climbing 3-5 steps with a railing? : A Lot 6 Click Score: 17    End of Session Equipment Utilized During Treatment: Gait belt Activity Tolerance: Patient limited by fatigue Patient left: in bed;with call bell/phone within reach;with family/visitor present Nurse Communication: Mobility status PT Visit Diagnosis: Unsteadiness on feet (R26.81);Muscle weakness (generalized) (M62.81);Other abnormalities of gait and mobility (R26.89);Difficulty in walking, not elsewhere classified (R26.2)     Time: 0156-1537 PT Time Calculation (min) (ACUTE ONLY): 36 min  Charges:  $Gait Training: 8-22 mins $Therapeutic Activity: 8-22 mins                     Tashana Haberl B. Migdalia Dk PT, DPT Acute Rehabilitation Services Pager [phone] Office [phone]    [place] 01/03/2021, 3:36 PM

## 2021-01-03 NOTE — Care Management Important Message (Signed)
Important Message  Patient Details  Name: Taylor Berry MRN: [id number] Date of Birth: [birth year]   Medicare Important Message Given:        Hannah Beat 01/03/2021, 12:38 PM

## 2021-01-03 NOTE — Progress Notes (Signed)
Occupational Therapy Treatment Patient Details Name: Taylor Berry MRN: [id number] DOB: Jul 25, 1940 Today's Date: 01/03/2021   History of present illness Pt is an 80 y/o female admitted 12/25 secondary to expressive aphasia and encephalopathy. Per notes, possibly secondary to symptomatic hyponatremia. 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 PMH includes HTN, lung cancer, RA, COPD, and uterine cancer.   OT comments  With encouragement, pt agreeable for OOB ADL assessment. Extended time spent discussing support at home, anticipated therapy needs, fall prevention strategies, when DME needed and energy conservation strategies. Overall, pt able to mobilize short distances without AD to/from bathroom and demo insight into reaching out for support. Pt able to complete toileting task, grooming tasks standing at sink and LB bathing with Supervision. Pt endorses dizziness and SOB during tasks (required seated rest break) though no overt safety concerns noted. Educated re: use of DME for mobility if pt feels she is reaching out for support often, use of BSC at night to decrease fall risk with nighttime bathroom trips, sponge bathing tasks rather than showering if assist not present and progression of activity tolerance at home. Recommend HHOT at DC, increased family-check ins and assist for showering tasks, IADLs in/outside of the home initially.   Recommendations for follow up therapy are one component of a multi-disciplinary discharge planning process, led by the attending physician.  Recommendations may be updated based on patient status, additional functional criteria and insurance authorization.    Follow Up Recommendations  Home health OT    Assistance Recommended at Discharge Intermittent Supervision/Assistance  Equipment Recommendations  BSC/3in1    Recommendations for Other Services      Precautions / Restrictions Precautions Precautions: Fall Required Braces or Orthoses: Other  Brace Other Brace: has R 5th digit splint due to arthritic deformity Restrictions Weight Bearing Restrictions: No       Mobility Bed Mobility               General bed mobility comments: sitting EOB on entry    Transfers Overall transfer level: Needs assistance Equipment used: None Transfers: Sit to/from Stand Sit to Stand: Supervision                 Balance Overall balance assessment: Needs assistance Sitting-balance support: No upper extremity supported;Feet supported Sitting balance-Leahy Scale: Good     Standing balance support: Bilateral upper extremity supported;No upper extremity supported;During functional activity Standing balance-Leahy Scale: Fair Standing balance comment: able to mobilize short distance without AD, occasioanl reaching out for support (likely for confidence rather than balance)                           ADL either performed or assessed with clinical judgement   ADL Overall ADL's : Needs assistance/impaired Eating/Feeding: Independent;Sitting   Grooming: Set up;Standing;Wash/dry face;Wash/dry hands;Oral care       Lower Body Bathing: Supervison/ safety;Sit to/from stand Lower Body Bathing Details (indicate cue type and reason): to bathe peri region in standing. thorough and stood for > 5 min for task though fatigued         Toilet Transfer: Supervision/safety;Ambulation;Regular Glass blower/designer Details (indicate cue type and reason): no use of AD (trial). pt aware of reaching out to sink for support (likely confidence rather than balance). Toileting- Clothing Manipulation and Hygiene: Supervision/safety;Sit to/from stand Toileting - Clothing Manipulation Details (indicate cue type and reason): able to perform hygiene, manage pad and clothing       General  ADL Comments: Extended time spent educating on fall prevention strategies (use of BSC at night, sponge bathing rather than showering task if no one present),  when DME may be needed (if reaching out for additonal support). Educated pt/son recommended to have someone present with showering tasks, IADLs assist in/out of home. Likely would not need 24/7 supervision (would be fine if performing leisure tasks, playing violin seated, etc without supervision), just frequent check ins. Encouraged pt to keep phone on person.    Extremity/Trunk Assessment Upper Extremity Assessment Upper Extremity Assessment: Overall WFL for tasks assessed;RUE deficits/detail RUE Deficits / Details: R 5th digit extenstion splint; hx of arthritic deformity and sx   Lower Extremity Assessment Lower Extremity Assessment: Defer to PT evaluation        Vision   Vision Assessment?: No apparent visual deficits   Perception     Praxis      Cognition Arousal/Alertness: Awake/alert Behavior During Therapy: WFL for tasks assessed/performed Overall Cognitive Status: Impaired/Different from baseline Area of Impairment: Awareness;Memory                     Memory: Decreased short-term memory     Awareness: Emergent   General Comments: mild memory deficits, do not appear to negatively affect daily function. Increased insight into deficits, fall prevention, benefits from prompts to problem solve potential barriers at home (bathroom night trips) but participatory in all education          Exercises     Shoulder Instructions       General Comments Hr up to upper 120s with activity, fatigues during ADLs (recently PTA per pt) but encouraged energy conservation and gradual progression of activity tolerance    Pertinent Vitals/ Pain       Pain Assessment: No/denies pain  Home Living                                          Prior Functioning/Environment              Frequency  Min 2X/week        Progress Toward Goals  OT Goals(current goals can now be found in the care plan section)  Progress towards OT goals: Progressing toward  goals  Acute Rehab OT Goals Patient Stated Goal: be confident at home OT Goal Formulation: With patient Time For Goal Achievement: 01/16/21 Potential to Achieve Goals: Good ADL Goals Pt Will Perform Lower Body Dressing: with set-up;sitting/lateral leans;sit to/from stand Pt Will Transfer to Toilet: with set-up;ambulating Additional ADL Goal #1: Pt to verbalize at least 2 fall prevention strategies Additional ADL Goal #2: Pt to increase activity tolerance > 7-10 min during ADLs/mobility  Plan Discharge plan needs to be updated    Co-evaluation                 AM-PAC OT "6 Clicks" Daily Activity     Outcome Measure   Help from another person eating meals?: None Help from another person taking care of personal grooming?: A Little Help from another person toileting, which includes using toliet, bedpan, or urinal?: A Little Help from another person bathing (including washing, rinsing, drying)?: A Little Help from another person to put on and taking off regular upper body clothing?: A Little Help from another person to put on and taking off regular lower body clothing?: A Little 6 Click Score: 19  End of Session Equipment Utilized During Treatment: Gait belt  OT Visit Diagnosis: Other abnormalities of gait and mobility (R26.89);Unsteadiness on feet (R26.81)   Activity Tolerance Patient tolerated treatment well   Patient Left in bed;with call bell/phone within reach   Nurse Communication Mobility status        Time: 1113-1209 OT Time Calculation (min): 56 min  Charges: OT General Charges $OT Visit: 1 Visit OT Treatments $Self Care/Home Management : 23-37 mins $Therapeutic Activity: 23-37 mins  Malachy Chamber, OTR/L Acute Rehab Services Office: [phone]   Layla Maw 01/03/2021, 1:31 PM

## 2021-01-03 NOTE — Progress Notes (Signed)
Subjective: Patient states her eyes feel a little bit swollen today.  Also concerned that maybe she was not urinating as much of the past 24 hours.  Happy to hear that her sodium has improved.  Objective Vital signs in last 24 hours: Vitals:   01/02/21 0749 01/02/21 2227 01/03/21 0447 01/03/21 0845  BP:  (!) 149/75 126/71   Pulse:  68 70   Resp:  17 17   Temp:  97.6 F (36.4 C) (!) 97.5 F (36.4 C)   TempSrc:  Oral Oral   SpO2: 95% 96% 97% 96%  Weight:      Height:       Weight change:   Intake/Output Summary (Last 24 hours) at 01/03/2021 1213 Last data filed at 01/03/2021 1009 Gross per 24 hour  Intake --  Output 1300 ml  Net -1300 ml    Assessment/Plan: 80 year old WF with multiple medical issues including copd and previous lung CA-  has had hyponatremia in the past and has it in a mild form now  1.  Hyponatremia-  has had in the past.  A high urine osm is highly suggestive of SIADH.  In addition , her volume status was a little heavy -  has at least trace edema and not a low BP so possibly an element of hypervolemic hyponatremia as well. I suspect her SIADH is more a result of her chronic pulmonary issues than other but effexor can cause SIADH-  I dont think that 2 doses would result in a significant sodium change.  agree with holding effexor.  -Sodium slightly improved to 130 today.   -Would discharge with Lasix 20 mg daily with close follow-up with primary doctor -Would uptitrate Lasix as needed.  If sodium is persistently lower than 130 in the outpatient setting would refer to nephrology for outpatient management  2.  Aphasia and confusion-  seems like maybe a TIA, not quite back to normal-  now MRI has shown stroke is on protocol   Given the improvement in sodium we will sign off at this time.   [name]    Labs: Basic Metabolic Panel: Recent Labs  Lab 12/30/20 2051 12/31/20 0452 01/01/21 0344 01/02/21 0418 01/03/21 0112  NA 125*   < > 126* 127* 130*   K 3.8   < > 4.0 4.5 3.8  CL 93*   < > 94* 92* 93*  CO2 23   < > 24 27 28   GLUCOSE 119*   < > 92 112* 111*  BUN 15   < > 13 18 18   CREATININE 0.68   < > 0.78 1.06* 0.97  CALCIUM 8.5*   8.8   < > 8.2* 9.5 10.1  PHOS 2.4*  --   --   --   --    < > = values in this interval not displayed.   Liver Function Tests: Recent Labs  Lab 12/30/20 1534  AST 25  ALT 12  ALKPHOS 38  BILITOT 0.8  PROT 6.6  ALBUMIN 3.2*   No results for input(s): LIPASE, AMYLASE in the last 168 hours. Recent Labs  Lab 12/30/20 2051  AMMONIA 16   CBC: Recent Labs  Lab 12/30/20 1534 12/30/20 1540 12/30/20 1821 12/31/20 0452  WBC 6.2  --   --  5.5  NEUTROABS 3.9  --   --   --   HGB 11.0* 12.2 11.6* 10.3*  HCT 33.7* 36.0 34.0* 30.4*  MCV 92.8  --   --  90.5  PLT 154  --   --  224   Cardiac Enzymes: No results for input(s): CKTOTAL, CKMB, CKMBINDEX, TROPONINI in the last 168 hours. CBG: Recent Labs  Lab 12/30/20 1528  GLUCAP 101*    Iron Studies: No results for input(s): IRON, TIBC, TRANSFERRIN, FERRITIN in the last 72 hours. Studies/Results: MR ANGIO HEAD WO CONTRAST  Result Date: 01/02/2021 CLINICAL DATA:  Stroke follow-up EXAM: MRA HEAD WITHOUT CONTRAST TECHNIQUE: Angiographic images of the Circle of Willis were acquired using MRA technique without intravenous contrast. COMPARISON:  Brain MRI from yesterday FINDINGS: Anterior circulation: No major branch occlusion or flow limiting stenosis. Diffusely attenuated right A1 segment which appears developmental. Downward pointing outpouching from the distal right MCA measuring 2 mm. On source images there is a closely neighboring branch extending inferiorly from this level which cannot be resolved relative to the outpouching. Posterior circulation: The vertebral and basilar arteries are smoothly contoured and widely patent. No branch occlusion, beading, or aneurysm. Anatomic variants: As above IMPRESSION: 1. No emergent finding.  No stenosis of the  basilar. 2. 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 Electronically Signed   By: Jorje Guild M.D.   On: 01/02/2021 07:28   MR MRA NECK W CONTRAST  Result Date: 01/02/2021 CLINICAL DATA:  Stroke EXAM: MRA NECK WITHOUT AND WITH CONTRAST TECHNIQUE: Multiplanar and multiecho pulse sequences of the neck were obtained without and with intravenous contrast. Angiographic images of the neck were obtained using MRA technique without and with intravenous contrast. CONTRAST:  68mL GADAVIST GADOBUTROL 1 MMOL/ML IV SOLN COMPARISON:  None. FINDINGS: Aortic arch: Three-vessel arch. No evidence of dissection, aneurysm, or occlusion. Right carotid system: Poor visualization of the origin of the right common carotid artery, secondary to artifact. The remainder of the right common and internal carotid arteries appear patent, without significant stenosis (greater than 50%, occlusion, or dissection. Left carotid system: The left common carotid artery and internal carotid artery are patent from their origin to the terminus. Vertebral arteries: Poor visualization of the origin of the left vertebral artery. No evidence of stenosis (greater than 50%), dissection, or aneurysm. IMPRESSION: No hemodynamically significant stenosis in the neck. Electronically Signed   By: Merilyn Baba M.D.   On: 01/02/2021 16:06   MR BRAIN WO CONTRAST  Result Date: 01/01/2021 CLINICAL DATA:  Word-finding difficulties and confusion EXAM: MRI HEAD WITHOUT CONTRAST TECHNIQUE: Multiplanar, multiecho pulse sequences of the brain and surrounding structures were obtained without intravenous contrast. COMPARISON:  03/11/2008 MRI, correlation is also made with 12/30/2020 CT head FINDINGS: Brain: Small focus of increased signal on diffusion-weighted imaging with ADC correlate in the left pons (series 3, image 18 and series 300, image 18), most likely an acute to subacute infarct. No other diffusion-weighted signal abnormalities. No acute hemorrhage, mass,  mass effect, or midline shift. Punctate foci of hemosiderin deposition in the right basal ganglia, left thalamus, right pons, and left cerebellum, likely sequela of remote hypertensive microhemorrhages. T2 hyperintense signal in the periventricular white matter, likely the sequela of chronic small vessel ischemic disease. No hydrocephalus or extra-axial collection. Vascular: Normal flow voids. Skull and upper cervical spine: Normal marrow signal. Sinuses/Orbits: Negative.  Status post bilateral lens replacements. Other: Fluid in left mastoid air cells. IMPRESSION: Punctate focus of restricted diffusion in the left pons, consistent with an acute to subacute infarct. 1. Electronically Signed   By: Merilyn Baba M.D.   On: 01/01/2021 18:57   ECHOCARDIOGRAM COMPLETE  Result Date: 01/02/2021    ECHOCARDIOGRAM  REPORT   Patient Name:   Taylor Berry Date of Exam: 01/02/2021 Medical Rec #:  [id number]        Height:       62.0 in Accession #:    [id number]       Weight:       166.6 lb Date of Birth:  28-Dec-1940        BSA:          1.769 m Patient Age:    25 years         BP:           130/76 mmHg Patient Gender: F                HR:           69 bpm. Exam Location:  Inpatient Procedure: 2D Echo, 3D Echo, Cardiac Doppler and Color Doppler Indications:    Stroke  History:        Patient has prior history of Echocardiogram examinations, most                 recent 06/21/2020. Cardiomyopathy and CHF, CAD, Pulmonary HTN and                 COPD, Signs/Symptoms:Edema, Shortness of Breath and Dyspnea;                 Risk Factors:Sleep Apnea and Hypertension. Cancer. ETOH.  Sonographer:    Roseanna Rainbow RDCS Referring Phys: [phone] JARED M GARDNER  Sonographer Comments: Technically difficult study due to poor echo windows. Image acquisition challenging due to patient body habitus and Image acquisition challenging due to COPD. IMPRESSIONS  1. Moderate septal hypertrophy with otherwise mild concentric LVH. Anteroseptal and  inferoseptal hypokinesis. Left ventricular ejection fraction, by estimation, is 45 to 50%. The left ventricle has mildly decreased function. The left ventricle demonstrates regional wall motion abnormalities (see scoring diagram/findings for description). There is moderate left ventricular hypertrophy. Left ventricular diastolic parameters are consistent with Grade I diastolic dysfunction (impaired relaxation).  2. Right ventricular systolic function is normal. The right ventricular size is normal. There is normal pulmonary artery systolic pressure.  3. The mitral valve is normal in structure. Trivial mitral valve regurgitation. No evidence of mitral stenosis.  4. The aortic valve is tricuspid. There is mild calcification of the aortic valve. There is mild thickening of the aortic valve. Aortic valve regurgitation is not visualized. No aortic stenosis is present.  5. The inferior vena cava is normal in size with greater than 50% respiratory variability, suggesting right atrial pressure of 3 mmHg. FINDINGS  Left Ventricle: Moderate septal hypertrophy with otherwise mild concentric LVH. Anteroseptal and inferoseptal hypokinesis. Left ventricular ejection fraction, by estimation, is 45 to 50%. The left ventricle has mildly decreased function. The left ventricle demonstrates regional wall motion abnormalities. The left ventricular internal cavity size was normal in size. There is moderate left ventricular hypertrophy. Left ventricular diastolic parameters are consistent with Grade I diastolic dysfunction (impaired relaxation). Indeterminate filling pressures. Right Ventricle: The right ventricular size is normal. No increase in right ventricular wall thickness. Right ventricular systolic function is normal. There is normal pulmonary artery systolic pressure. The tricuspid regurgitant velocity is 1.40 m/s, and  with an assumed right atrial pressure of 3 mmHg, the estimated right ventricular systolic pressure is 46.2  mmHg. Left Atrium: Left atrial size was normal in size. Right Atrium: Right atrial size was normal in size. Pericardium: There is no evidence of  pericardial effusion. Mitral Valve: The mitral valve is normal in structure. Mild mitral annular calcification. Trivial mitral valve regurgitation. No evidence of mitral valve stenosis. Tricuspid Valve: The tricuspid valve is normal in structure. Tricuspid valve regurgitation is trivial. No evidence of tricuspid stenosis. Aortic Valve: The aortic valve is tricuspid. There is mild calcification of the aortic valve. There is mild thickening of the aortic valve. Aortic valve regurgitation is not visualized. No aortic stenosis is present. Pulmonic Valve: The pulmonic valve was normal in structure. Pulmonic valve regurgitation is trivial. No evidence of pulmonic stenosis. Aorta: The aortic root is normal in size and structure. Venous: The inferior vena cava is normal in size with greater than 50% respiratory variability, suggesting right atrial pressure of 3 mmHg. IAS/Shunts: No atrial level shunt detected by color flow Doppler.  LEFT VENTRICLE PLAX 2D LVIDd:         4.00 cm     Diastology LVIDs:         3.35 cm     LV e' medial:    5.08 cm/s LV PW:         1.20 cm     LV E/e' medial:  9.8 LV IVS:        1.50 cm     LV e' lateral:   4.81 cm/s LVOT diam:     2.40 cm     LV E/e' lateral: 10.3 LV SV:         83 LV SV Index:   47 LVOT Area:     4.52 cm                             3D Volume EF: LV Volumes (MOD)           3D EF:        41 % LV vol d, MOD A2C: 81.0 ml LV EDV:       101 ml LV vol d, MOD A4C: 65.2 ml LV ESV:       60 ml LV vol s, MOD A2C: 50.3 ml LV SV:        42 ml LV vol s, MOD A4C: 32.4 ml LV SV MOD A2C:     30.7 ml LV SV MOD A4C:     65.2 ml LV SV MOD BP:      30.0 ml RIGHT VENTRICLE             IVC RV S prime:     12.90 cm/s  IVC diam: 1.30 cm TAPSE (M-mode): 2.1 cm LEFT ATRIUM             Index        RIGHT ATRIUM          Index LA diam:        3.65 cm 2.06 cm/m    RA Area:     9.66 cm LA Vol (A2C):   40.4 ml 22.84 ml/m  RA Volume:   17.70 ml 10.01 ml/m LA Vol (A4C):   19.1 ml 10.80 ml/m LA Biplane Vol: 28.5 ml 16.11 ml/m  AORTIC VALVE LVOT Vmax:   108.00 cm/s LVOT Vmean:  66.600 cm/s LVOT VTI:    0.184 m  AORTA Ao Root diam: 3.30 cm Ao Asc diam:  3.60 cm MITRAL VALVE                TRICUSPID VALVE MV Area (PHT): 3.27 cm     TR  Peak grad:   7.8 mmHg MV Decel Time: 232 msec     TR Vmax:        140.00 cm/s MV E velocity: 49.70 cm/s MV A velocity: 110.00 cm/s  SHUNTS MV E/A ratio:  0.45         Systemic VTI:  0.18 m                             Systemic Diam: 2.40 cm Skeet Latch MD Electronically signed by Skeet Latch MD Signature Date/Time: 01/02/2021/4:52:56 PM    Final    Medications: Infusions:  sodium chloride      Scheduled Medications:  albuterol  2.5 mg Inhalation See admin instructions   aspirin EC  81 mg Oral Daily   atorvastatin  40 mg Oral Daily   calcium carbonate  1 tablet Oral TID WC   clopidogrel  75 mg Oral Daily   colestipol  2 g Oral BID   enoxaparin (LOVENOX) injection  40 mg Subcutaneous Q24H   famotidine  40 mg Oral QPM   umeclidinium bromide  1 puff Inhalation Daily   And   fluticasone furoate-vilanterol  1 puff Inhalation Daily   furosemide  40 mg Oral Daily   leflunomide  20 mg Oral Daily   metoprolol succinate  25 mg Oral q AM   psyllium  1 packet Oral Daily   sodium chloride flush  3 mL Intravenous Q12H   thiamine  500 mg Oral Daily    have reviewed scheduled and prn medications.  Physical Exam: General:  NAD-in bed, no periorbital edema Heart: Normal rate Lungs: Bilateral chest rise with no increased work of breathing Abdomen: soft, non tender Extremities: no edema warm and well perfused    01/03/2021,12:13 PM  LOS: 3 days

## 2021-01-04 ENCOUNTER — Other Ambulatory Visit: Payer: Self-pay | Admitting: Cardiology

## 2021-01-04 DIAGNOSIS — I10 Essential (primary) hypertension: Secondary | ICD-10-CM

## 2021-01-04 DIAGNOSIS — I428 Other cardiomyopathies: Secondary | ICD-10-CM

## 2021-01-05 DIAGNOSIS — I10 Essential (primary) hypertension: Secondary | ICD-10-CM | POA: Diagnosis not present

## 2021-01-05 DIAGNOSIS — C3412 Malignant neoplasm of upper lobe, left bronchus or lung: Secondary | ICD-10-CM | POA: Diagnosis not present

## 2021-01-05 DIAGNOSIS — M069 Rheumatoid arthritis, unspecified: Secondary | ICD-10-CM | POA: Diagnosis not present

## 2021-01-05 DIAGNOSIS — E785 Hyperlipidemia, unspecified: Secondary | ICD-10-CM | POA: Diagnosis not present

## 2021-01-05 DIAGNOSIS — I69320 Aphasia following cerebral infarction: Secondary | ICD-10-CM | POA: Diagnosis not present

## 2021-01-05 DIAGNOSIS — E871 Hypo-osmolality and hyponatremia: Secondary | ICD-10-CM | POA: Diagnosis not present

## 2021-01-05 DIAGNOSIS — I429 Cardiomyopathy, unspecified: Secondary | ICD-10-CM | POA: Diagnosis not present

## 2021-01-05 DIAGNOSIS — I5020 Unspecified systolic (congestive) heart failure: Secondary | ICD-10-CM | POA: Diagnosis not present

## 2021-01-05 DIAGNOSIS — I2510 Atherosclerotic heart disease of native coronary artery without angina pectoris: Secondary | ICD-10-CM | POA: Diagnosis not present

## 2021-01-05 DIAGNOSIS — K219 Gastro-esophageal reflux disease without esophagitis: Secondary | ICD-10-CM | POA: Diagnosis not present

## 2021-01-05 DIAGNOSIS — G9340 Encephalopathy, unspecified: Secondary | ICD-10-CM | POA: Diagnosis not present

## 2021-01-08 ENCOUNTER — Telehealth: Payer: Self-pay | Admitting: Gastroenterology

## 2021-01-08 NOTE — Telephone Encounter (Signed)
Review of 25 pages of records from [place] seen by Dr. Michail Sermon 06/14/20 for 1.5 years of fecal incontinence described as leakage of loose to mushy stools that worsen when she walks or is otherwise physically active. No improvement with Kegel exercises.   Colonoscopy 2012 showed multiple tubular adenomas and extensive diverticulosis Colonoscopy 02/2015 showed diverticulosis and internal hemorrhoids  He referred her to Dr. Peggye Form at [facility]

## 2021-01-08 NOTE — Telephone Encounter (Signed)
See note from pt regarding cancelling procedure.

## 2021-01-09 DIAGNOSIS — M069 Rheumatoid arthritis, unspecified: Secondary | ICD-10-CM | POA: Diagnosis not present

## 2021-01-09 DIAGNOSIS — I2510 Atherosclerotic heart disease of native coronary artery without angina pectoris: Secondary | ICD-10-CM | POA: Diagnosis not present

## 2021-01-09 DIAGNOSIS — C3412 Malignant neoplasm of upper lobe, left bronchus or lung: Secondary | ICD-10-CM | POA: Diagnosis not present

## 2021-01-09 DIAGNOSIS — E871 Hypo-osmolality and hyponatremia: Secondary | ICD-10-CM | POA: Diagnosis not present

## 2021-01-09 DIAGNOSIS — I429 Cardiomyopathy, unspecified: Secondary | ICD-10-CM | POA: Diagnosis not present

## 2021-01-09 DIAGNOSIS — I10 Essential (primary) hypertension: Secondary | ICD-10-CM | POA: Diagnosis not present

## 2021-01-09 DIAGNOSIS — G9340 Encephalopathy, unspecified: Secondary | ICD-10-CM | POA: Diagnosis not present

## 2021-01-09 DIAGNOSIS — I69320 Aphasia following cerebral infarction: Secondary | ICD-10-CM | POA: Diagnosis not present

## 2021-01-09 DIAGNOSIS — I5020 Unspecified systolic (congestive) heart failure: Secondary | ICD-10-CM | POA: Diagnosis not present

## 2021-01-10 ENCOUNTER — Encounter: Payer: HMO | Admitting: Gastroenterology

## 2021-01-10 DIAGNOSIS — G9340 Encephalopathy, unspecified: Secondary | ICD-10-CM | POA: Diagnosis not present

## 2021-01-10 DIAGNOSIS — F801 Expressive language disorder: Secondary | ICD-10-CM | POA: Diagnosis not present

## 2021-01-10 DIAGNOSIS — I639 Cerebral infarction, unspecified: Secondary | ICD-10-CM | POA: Diagnosis not present

## 2021-01-10 DIAGNOSIS — E871 Hypo-osmolality and hyponatremia: Secondary | ICD-10-CM | POA: Diagnosis not present

## 2021-01-14 ENCOUNTER — Other Ambulatory Visit (HOSPITAL_COMMUNITY): Payer: Self-pay

## 2021-01-14 ENCOUNTER — Other Ambulatory Visit: Payer: Self-pay | Admitting: *Deleted

## 2021-01-14 ENCOUNTER — Telehealth (HOSPITAL_COMMUNITY): Payer: Self-pay | Admitting: Pharmacy Technician

## 2021-01-14 ENCOUNTER — Other Ambulatory Visit: Payer: Self-pay

## 2021-01-14 NOTE — Patient Outreach (Signed)
[facility]) Care Management  01/14/2021  Taylor Berry November 20, 1940 [id number]    RED ON EMMI ALERT - Stroke Day # 6 Date: 1/6 Red Alert Reason: Feeling worse?  YES   Outreach attempt #1, successful. Identity verified.  This care manager introduced self and stated purpose of call.  [facility] care management services explained.    Member report she lives alone however was told that she needed 24/7 supervision and assistance.  She does have [facility] for home health PT, OT, and ST however feels this is not enough support for her.  She remains weak and unable to care for self.  The week after discharge, she and her sister were able to pay out of pocket to have in home aide assistance 6 hours a day, no longer to pay.  Family able to provide minimal support in the home.  She was seen by PCP on 1/6 and discussed being placed in SNF for short term rehab.  Per member, PCP agreed and would provide orders but will need guidance and help with placement procedure.    She has follow up appointment with neurology on 2/7, sister or son will provide transportation.  Does not have any concerns regarding medication management, state she is taking as instructed.  She was not feeling well over the last few days, state she had increase in dizziness but that has resolved, no longer "feeling worse."    Plan: RN CM will place referral to [facility] chronic care management team for CSW and RNCM involvement with urgent request for placement.  Will follow up with member within the next week.  Valente David, RN, MSN, [place] Manager [phone]

## 2021-01-14 NOTE — Patient Outreach (Signed)
Received a red flag Emmi stroke notification for Taylor Berry, Taylor Berry a [employer] patient.  I have assigned Valente David, RN to call for follow up and determine if there are any Case Management needs.   [facility], [place], [place] Management [phone]

## 2021-01-14 NOTE — Patient Outreach (Addendum)
[place] [facility]) Care Management  01/14/2021  JOVAN SCHICKLING 1940/08/31 [id number]   Referral received from Valente David, RN care coordinator for social work needs per [facility] Stroke patient. Request sent [facility] Care Management via email at [email or web address] as patient has [facility].  Ina [facility] Management Assistant [phone]

## 2021-01-14 NOTE — Patient Outreach (Signed)
Received an order for social work from [employer], Therapist, sports.  Patient has [employer]. Following their workflow I have sent this referral to their Care Management group through email [email or web address] for Care Management.    Arville Care, [place], [place] Management [phone]

## 2021-01-14 NOTE — Telephone Encounter (Signed)
Pharmacy Transitions of Care Follow-up Telephone Call  Date of discharge: 01/03/2021  Discharge Diagnosis: CVA  How have you been since you were released from the hospital? Patient says she has been feeling okay.  Medication changes made at discharge:  START taking: clopidogrel (PLAVIX)  furosemide (LASIX)  vitamin B-12 (CYANOCOBALAMIN)  CHANGE how you take: Aspirin Low Dose (aspirin)  atorvastatin (LIPITOR)   Medication changes verified by the patient? Yes    Medication Accessibility:  Home Pharmacy: [facility] on [address] [phone]  Was the patient provided with refills on discharged medications? Yes   Have all prescriptions been transferred from [facility] to home pharmacy? Yes   Is the patient able to afford medications? Yes Notable copays: None noted    Medication Review:  CLOPIDOGREL (PLAVIX) Clopidogrel 75 mg once daily.  - Educated patient on expected duration of therapy of aspirin 81 mg with clopidogrel for 3 weeks, then will continue Plavix alone per Neuro. - Reviewed potential DDIs with patient  - Advised patient of medications to avoid (NSAIDs, ASA)  - Educated that Tylenol (acetaminophen) will be the preferred analgesic to prevent risk of bleeding  - Emphasized importance of monitoring for signs and symptoms of bleeding (abnormal bruising, prolonged bleeding, nose bleeds, bleeding from gums, discolored urine, black tarry stools)  - Advised patient to alert all providers of anticoagulation therapy prior to starting a new medication or having a procedure    Follow-up Appointments:  [facility] f/u appt confirmed? Yes Scheduled to see Vaughan Browner on 02/12/2021 @ 9:30 am.    Final Patient Assessment: -Pt is doing well. Denies bleeding/bruising. Reports taking all medications as prescribed. -Pt verbalized understanding of Plavix.  -Pt has post discharge appointment and refill sent to [facility].   Parthenia Ames, PharmD

## 2021-01-25 ENCOUNTER — Other Ambulatory Visit: Payer: Self-pay | Admitting: *Deleted

## 2021-01-25 DIAGNOSIS — C3490 Malignant neoplasm of unspecified part of unspecified bronchus or lung: Secondary | ICD-10-CM | POA: Diagnosis not present

## 2021-01-25 DIAGNOSIS — R41841 Cognitive communication deficit: Secondary | ICD-10-CM | POA: Diagnosis not present

## 2021-01-25 DIAGNOSIS — R2681 Unsteadiness on feet: Secondary | ICD-10-CM | POA: Diagnosis not present

## 2021-01-25 DIAGNOSIS — I639 Cerebral infarction, unspecified: Secondary | ICD-10-CM | POA: Diagnosis not present

## 2021-01-25 DIAGNOSIS — G4733 Obstructive sleep apnea (adult) (pediatric): Secondary | ICD-10-CM | POA: Diagnosis not present

## 2021-01-25 DIAGNOSIS — I6330 Cerebral infarction due to thrombosis of unspecified cerebral artery: Secondary | ICD-10-CM | POA: Diagnosis not present

## 2021-01-25 DIAGNOSIS — E871 Hypo-osmolality and hyponatremia: Secondary | ICD-10-CM | POA: Diagnosis not present

## 2021-01-25 DIAGNOSIS — R4701 Aphasia: Secondary | ICD-10-CM | POA: Diagnosis not present

## 2021-01-25 DIAGNOSIS — R262 Difficulty in walking, not elsewhere classified: Secondary | ICD-10-CM | POA: Diagnosis not present

## 2021-01-25 DIAGNOSIS — M6281 Muscle weakness (generalized): Secondary | ICD-10-CM | POA: Diagnosis not present

## 2021-01-25 DIAGNOSIS — I10 Essential (primary) hypertension: Secondary | ICD-10-CM | POA: Diagnosis not present

## 2021-01-25 DIAGNOSIS — J449 Chronic obstructive pulmonary disease, unspecified: Secondary | ICD-10-CM | POA: Diagnosis not present

## 2021-01-25 DIAGNOSIS — C3410 Malignant neoplasm of upper lobe, unspecified bronchus or lung: Secondary | ICD-10-CM | POA: Diagnosis not present

## 2021-01-25 DIAGNOSIS — I428 Other cardiomyopathies: Secondary | ICD-10-CM | POA: Diagnosis not present

## 2021-01-25 DIAGNOSIS — F331 Major depressive disorder, recurrent, moderate: Secondary | ICD-10-CM | POA: Diagnosis not present

## 2021-01-25 DIAGNOSIS — M069 Rheumatoid arthritis, unspecified: Secondary | ICD-10-CM | POA: Diagnosis not present

## 2021-01-25 DIAGNOSIS — D630 Anemia in neoplastic disease: Secondary | ICD-10-CM | POA: Diagnosis not present

## 2021-01-25 DIAGNOSIS — K219 Gastro-esophageal reflux disease without esophagitis: Secondary | ICD-10-CM | POA: Diagnosis not present

## 2021-01-25 NOTE — Patient Outreach (Signed)
[facility]) Care Management  01/25/2021  Taylor Berry [birth year] [id number]   Outgoing call placed to member to follow up on contact with [facility] care management program.  State she has not spoken to anyone, does not want to speak on the phone today.   Attempted to assess urgent needs, report she does not feel up to talking to day.  Will follow up with [facility] care management team and member within the next week.  Valente David, RN, MSN, [place] Manager [phone]

## 2021-01-30 ENCOUNTER — Other Ambulatory Visit: Payer: Self-pay | Admitting: *Deleted

## 2021-01-30 DIAGNOSIS — C3490 Malignant neoplasm of unspecified part of unspecified bronchus or lung: Secondary | ICD-10-CM | POA: Diagnosis not present

## 2021-01-30 DIAGNOSIS — I639 Cerebral infarction, unspecified: Secondary | ICD-10-CM | POA: Diagnosis not present

## 2021-01-30 DIAGNOSIS — G4733 Obstructive sleep apnea (adult) (pediatric): Secondary | ICD-10-CM | POA: Diagnosis not present

## 2021-01-30 DIAGNOSIS — E871 Hypo-osmolality and hyponatremia: Secondary | ICD-10-CM | POA: Diagnosis not present

## 2021-01-30 NOTE — Patient Outreach (Signed)
[place] [facility]) Care Management  01/30/2021  Taylor Berry May 14, 1940 [id number]   Outgoing call placed to member, state she is now at [facility] SNF.  Denies any further needs, will close case at this time.  Valente David, RN, MSN, [place] Manager [phone]

## 2021-01-31 ENCOUNTER — Ambulatory Visit: Payer: Self-pay | Admitting: *Deleted

## 2021-02-12 ENCOUNTER — Ambulatory Visit: Payer: HMO | Admitting: Family Medicine

## 2021-02-12 ENCOUNTER — Inpatient Hospital Stay: Payer: HMO | Admitting: Adult Health

## 2021-02-12 ENCOUNTER — Encounter: Payer: Self-pay | Admitting: Family Medicine

## 2021-02-12 VITALS — BP 132/76 | HR 87 | Ht 62.0 in | Wt 163.0 lb

## 2021-02-12 DIAGNOSIS — Z09 Encounter for follow-up examination after completed treatment for conditions other than malignant neoplasm: Secondary | ICD-10-CM | POA: Diagnosis not present

## 2021-02-12 DIAGNOSIS — E871 Hypo-osmolality and hyponatremia: Secondary | ICD-10-CM

## 2021-02-12 DIAGNOSIS — G9340 Encephalopathy, unspecified: Secondary | ICD-10-CM | POA: Diagnosis not present

## 2021-02-12 DIAGNOSIS — I6330 Cerebral infarction due to thrombosis of unspecified cerebral artery: Secondary | ICD-10-CM | POA: Diagnosis not present

## 2021-02-12 NOTE — Progress Notes (Signed)
[facility] [address] [place]. [place] [address] [phone]       HOSPITAL FOLLOW UP NOTE  Ms. [name] Date of Birth:  October 27, 1940 Medical Record Number:  [id number]   Reason for Referral:  hospital stroke follow up  SUBJECTIVE:   CHIEF COMPLAINT:  Chief Complaint  Patient presents with   Follow-up    Pt with son, rm 25 was dc home and then went to [facility] for rehab. Was just dc on sat. She is now received home health PT. Overall ok can get short of breath easily and oxygen level can drop. From walking waiting area to room her o2 sat was as low as 82%. After sitting it came up to 88%. She has copd and heart failure    HPI:   Taylor Berry is a 81 y.o. who  has a past medical history of Abnormal CT scan, Allergic rhinitis, Anemia (2015), Angio-edema, Complication of anesthesia, COPD (chronic obstructive pulmonary disease) ([place]), Depression, Diaphoresis (07/30/2015), Difficult intravenous access, GERD (gastroesophageal reflux disease), Hyperlipidemia, Hypertension, Left bundle branch block, Lung cancer ([place]) (2015), Neuropathy, Obesity, OSA (obstructive sleep apnea), Osteoarthritis, Osteoporosis, Recurrent upper respiratory infection (URI), Rheumatoid arthritis(714.0), SOB (shortness of breath) on exertion, and Uterine cancer ([place]) (1971).    Patient presented on 12/30/20 with complaints of speech difficulty. CTH was negative for acute finding and NIHSS 0, and was given Aspirin 325 mg. Patient found to have acute metabolic encephalopathy secondary to hyponatremia due to SIADH. She was given lasix and placed of fluid restriction. MRI obtained and demonstrated punctate acute-subacute stroke in pons. She didn't have any numbness or weakness at the time of symptom onset. She is now ambulating with a walker and has never had to use one before. She continues with physical therapy at home. She has a follow up with her PCP in March and they are trying to get  a follow up sooner.   Since discharge from hospital, she reports doing fairly well. HH recommended at discharge, however, patient lives alone and son was unable to accommodate. She went to [facility] for 2 weeks for rehab. Now continues home PT and feels she is making progress. She does feel more off balance than prior to stroke. She is using a walker. No falls. She took asa and Plavix for three week and has continued Plavix alone. She continues atorvastatin and lipids are well managed. BP has been normal. She is followed cardiology and pulmonology. She is not currently using CPAP therapy (followed by pulm). She reports she can not tolerate therapy.    Personally reviewed hospitalization pertinent progress notes, lab work and imaging.  Evaluated by Dr. Erlinda Hong.   PERTINENT IMAGING/LABS  MR BRAIN : left small pontine infarct CTA HEAD/NECK : no acute finding MRA HEAD/NECK : unremarkable  2D ECHO : EF 45-50%    A1C Lab Results  Component Value Date   HGBA1C 5.4 01/02/2021    Lipid Panel     Component Value Date/Time   CHOL 128 01/02/2021 0418   TRIG 51 01/02/2021 0418   HDL 60 01/02/2021 0418   CHOLHDL 2.1 01/02/2021 0418   VLDL 10 01/02/2021 0418   LDLCALC 58 01/02/2021 0418     ROS:   14 system review of systems performed and negative with exception of those listed in HPI  PMH:  Past Medical History:  Diagnosis Date   Abnormal CT scan    Allergic rhinitis    Anemia 2015   after lung surgery  Angio-edema    Complication of anesthesia    Fentanyl allergy, "claustrophobia"   COPD (chronic obstructive pulmonary disease) ([place])    Depression    Diaphoresis 07/30/2015   Excessive sweating with little exertion   Difficult intravenous access    GERD (gastroesophageal reflux disease)    In the past   Hyperlipidemia    Hypertension    Left bundle branch block    Lung cancer ([place]) 2015   left upper lobe wedge removed-no further tx.   Neuropathy    Tingling in arms,  fingers, and feet (Since 06/15/16)   Obesity    OSA (obstructive sleep apnea)    denies.   Osteoarthritis    ra also   Osteoporosis    Recurrent upper respiratory infection (URI)    Rheumatoid arthritis(714.0)    Dr. Amil Amen   SOB (shortness of breath) on exertion    Uterine cancer ([place]) 1971   tx surgical    PSH:  Past Surgical History:  Procedure Laterality Date   ABDOMINAL HYSTERECTOMY     in situ carcinoma partial   CARDIAC CATHETERIZATION  01/27/11   minor non-obs CAD, NL EF, mild pulm HTN   CATARACT EXTRACTION, BILATERAL     last done 12'16-   COLONOSCOPY W/ BIOPSIES AND POLYPECTOMY     COLONOSCOPY WITH PROPOFOL N/A 02/13/2015   Procedure: COLONOSCOPY WITH PROPOFOL;  Surgeon: Laurence Spates, MD;  [facility];  Service: Endoscopy;  Laterality: N/A;   ESOPHAGOGASTRODUODENOSCOPY (EGD) WITH PROPOFOL N/A 09/30/2016   Procedure: ESOPHAGOGASTRODUODENOSCOPY (EGD) WITH PROPOFOL;  Surgeon: Laurence Spates, MD;  [facility];  Service: Endoscopy;  Laterality: N/A;   LYMPH NODE DISSECTION Left 04/12/2013   Procedure: LYMPH NODE DISSECTION;  Surgeon: [name], MD;  Location: [place];  Service: Thoracic;  Laterality: Left;   RIGHT/LEFT HEART CATH AND CORONARY ANGIOGRAPHY N/A 10/08/2016   Procedure: RIGHT/LEFT HEART CATH AND CORONARY ANGIOGRAPHY;  Surgeon: Burnell Blanks, MD;  Location: [place] [facility];  Service: Cardiovascular;  Laterality: N/A;   TONSILLECTOMY     VESICOVAGINAL FISTULA CLOSURE W/ TAH  1971   VIDEO ASSISTED THORACOSCOPY (VATS)/THOROCOTOMY Left 04/12/2013   Procedure: VIDEO ASSISTED THORACOSCOPY (VATS)/THOROCOTOMY, WITH LEFT UPPER LOBE WEDGE RESECTION, CHEST WALL BIOPSY ;  Surgeon: [name], MD;  Location: [facility];  Service: Thoracic;  Laterality: Left;   VIDEO BRONCHOSCOPY N/A 04/12/2013   Procedure: VIDEO BRONCHOSCOPY;  Surgeon: [name], MD;  Location: [facility] OR;  Service: Thoracic;  Laterality: N/A;    Social History:  Social  History   Socioeconomic History   Marital status: Widowed    Spouse name: Not on file   Number of children: 2   Years of education: College   Highest education level: Master's degree (e.g., MA, MS, MEng, MEd, MSW, MBA)  Occupational History   Occupation: Nurse, adult: [employer]  Tobacco Use   Smoking status: Former    Packs/day: 1.00    Years: 35.00    Pack years: 35.00    Types: Cigarettes    Quit date: 01/07/1988    Years since quitting: 33.1   Smokeless tobacco: Never  Vaping Use   Vaping Use: Never used  Substance and Sexual Activity   Alcohol use: No    Comment: not since 1988   Drug use: No   Sexual activity: Never  Other Topics Concern   Not on file  Social History Narrative   Not on file   Social Determinants of Health  Financial Resource Strain: Not on file  Food Insecurity: Not on file  Transportation Needs: Not on file  Physical Activity: Not on file  Stress: Not on file  Social Connections: Not on file  Intimate Partner Violence: Not on file    Family History:  Family History  Problem Relation Age of Onset   Lung cancer Mother    Coronary artery disease Son    Heart attack Son 21   Arthritis/Rheumatoid Maternal Grandmother    Coronary artery disease Son    Arthritis/Rheumatoid Son     Medications:   Current Outpatient Medications on File Prior to Visit  Medication Sig Dispense Refill   albuterol (VENTOLIN HFA) 108 (90 Base) MCG/ACT inhaler INHALE 2 PUFFS BY MOUTH IN EACH NOSTRIL EVERY 4 TO 6 HOURS AS NEEDED (Patient taking differently: Inhale 2 puffs into the lungs See admin instructions. Inhale 2 puffs into the lungs every 4-6 hours as needed for wheezing or shortness of breath) 18 g 0   AMBULATORY NON FORMULARY MEDICATION Take 1 capsule by mouth See admin instructions. Synogut probiotic capsules- Take 1 capsule by mouth in the morning and at bedtime     atorvastatin (LIPITOR) 40 MG tablet Take 1 tablet (40 mg total) by mouth daily. 30 tablet  3   CALCIUM-VITAMIN D PO Take 1 tablet by mouth daily.     candesartan (ATACAND) 8 MG tablet TAKE 1 TABLET(8 MG) BY MOUTH DAILY 90 tablet 3   clopidogrel (PLAVIX) 75 MG tablet Take 1 tablet (75 mg total) by mouth daily. 30 tablet 3   colestipol (COLESTID) 1 g tablet Take 2 tablets (2 g total) by mouth 2 (two) times daily. 60 tablet 2   denosumab (PROLIA) 60 MG/ML SOSY injection Inject 60 mg into the skin every 6 (six) months.     famotidine (PEPCID) 40 MG tablet Take 40 mg by mouth every evening.     fluticasone (FLONASE) 50 MCG/ACT nasal spray Place 1-2 sprays into both nostrils daily as needed for allergies or rhinitis.     Fluticasone-Umeclidin-Vilant (TRELEGY ELLIPTA) 100-62.5-25 MCG/ACT AEPB Inhale 1 puff into the lungs daily. 60 each 5   furosemide (LASIX) 40 MG tablet Take 1/2 tablets (20 mg total) by mouth daily. 30 tablet 0   leflunomide (ARAVA) 20 MG tablet Take 20 mg by mouth daily.     METAMUCIL FIBER PO Take by mouth See admin instructions. Mix 1 tablespoonful of powder into 4-8 ounces of desired beverage and drink once a day     metoprolol succinate (TOPROL-XL) 25 MG 24 hr tablet TAKE 1 TABLET(25 MG) BY MOUTH DAILY (Patient taking differently: Take 25 mg by mouth in the morning.) 90 tablet 3   predniSONE (DELTASONE) 5 MG tablet Take 5 mg by mouth daily as needed (AS DIRECTED- for R.A. flares).     venlafaxine XR (EFFEXOR-XR) 75 MG 24 hr capsule Take 75 mg by mouth daily with breakfast.     vitamin B-12 (CYANOCOBALAMIN) 1000 MCG tablet Take 1 tablet (1,000 mcg total) by mouth daily. 30 tablet 0   No current facility-administered medications on file prior to visit.    Allergies:   Allergies  Allergen Reactions   Latex Itching and Rash   Ace Inhibitors Cough   Angiotensin Receptor Blockers Swelling and Other (See Comments)    Tongue swelling   Fentanyl Other (See Comments)    Hallucinations "I go crazy"      Hydrochlorothiazide Other (See Comments)    Decreased Sodium  Hydroxychloroquine Other (See Comments)    "Decreased sodium"   Penicillins Other (See Comments)    Yeast infections Has patient had a PCN reaction causing immediate rash, facial/tongue/throat swelling, SOB or lightheadedness with hypotension:No Has patient had a PCN reaction causing severe rash involving mucus membranes or skin necrosis: No Has patient had a PCN reaction that required hospitalization No Has patient had a PCN reaction occurring within the last 10 years: Yes If all of the above answers are "NO", then may proceed with Cephalosporin use.  Other reaction(s): yeast infection      OBJECTIVE:  Physical Exam  Vitals:   02/12/21 1258  BP: 132/76  Pulse: 87  Weight: 163 lb (73.9 kg)  Height: 5\' 2"  (1.575 m)   Body mass index is 29.81 kg/m. No results found.  Depression screen [facility] 2/9 12/06/2019  Decreased Interest 0  Down, Depressed, Hopeless 0  PHQ - 2 Score 0  Some recent data might be hidden     General: well developed, well nourished, seated, in no evident distress Head: head normocephalic and atraumatic.   Neck: supple with no carotid or supraclavicular bruits Cardiovascular: regular rate and rhythm, no murmurs Musculoskeletal: no deformity Skin:  no rash/petichiae Vascular:  Normal pulses all extremities. Minimal, non-pitting edema to lower extremities L>R   Neurologic Exam Mental Status: Awake and fully alert.  Fluent speech and language.  Oriented to place and time. Recent and remote memory intact. Attention span, concentration and fund of knowledge appropriate. Mood and affect appropriate.  Cranial Nerves: Fundoscopic exam reveals sharp disc margins. Pupils equal, briskly reactive to light. Extraocular movements full without nystagmus. Visual fields full to confrontation. Hearing intact. Facial sensation intact. Face, tongue, palate moves normally and symmetrically.  Motor: Normal bulk and tone. Strength 4/5 in all four extremities. Sensory.: intact to  touch , pinprick , position and vibratory sensation.  Coordination: Finger-to-nose and heel-to-shin performed accurately bilaterally. Gait and Station: Ambulation with front wheel walker.  Reflexes: 1+ and symmetric.    NIHSS  0 Modified Rankin  1   ASSESSMENT: Taylor Berry is a 81 y.o. year old female. Vascular risk factors include HTN, HLD, advanced age, obesity, OSA, untreated.   PLAN:  Punctate acute-subacute stroke in pons and Acute Metabolic Encephalopathy secondary to small vessel disease: Residual deficit: gait instability. Continue Plavix for secondary stroke prevention. Discussed secondary stroke prevention measures and importance of close PCP follow up for aggressive stroke risk factor management. I have gone over the pathophysiology of stroke, warning signs and symptoms, risk factors and their management in some detail with instructions to go to the closest emergency room for symptoms of concern. Continue with home physical therapy. Continue using walker. Fall precautions advised.  HTN: BP goal <130/90. Stable on  Candesartan 8 MG and Metoprolol succinate 25 MG 24 hr tablet per PCP HLD: LDL goal <70. Recent LDL 58. Continue atorvastatin 40mg  daily per PCP DMII: A1c goal<7.0. Recent A1c 5.4. not diabetic  5.   Hyponatremia: NA goal 135-145.  Continue with the furosemide 20 mg daily per PCP  Recent NA 130. We will collect a BMP and CBC today.  6.   OSA: Not current treated. Education provided regarding risks of untreated OSA. Advised discussion with pulmonology if she wishes to resume.    Follow up in 6 months or call earlier if needed   CC:  [facility] provider: Dr. Leonie Man PCP: Kelton Pillar, MD    I spent 30 minutes of face-to-face and non-face-to-face time with  patient.  This included previsit chart review including review of recent hospitalization, lab review, study review, order entry, electronic health record documentation, patient education regarding recent stroke  including etiology, secondary stroke prevention measures and importance of managing stroke risk factors, residual deficits and typical recovery time and answered all other questions to patient satisfaction   Debbora Presto, [facility]  [facility] [address] [place], [place] [address]  Phone [phone] Fax [phone] Note: This document was prepared with digital dictation and possible smart phrase technology. Any transcriptional errors that result from this process are unintentional.

## 2021-02-12 NOTE — Patient Instructions (Addendum)
Below is our plan:  We will continue with Plavix and discussed major risk factors for bleeding and to seek immediate medical attention if you experience a fall or hit your head.   We discussed stroke risk factors and how to manage your blood pressure and cholesterol. Continue with close follow up with your primary care provider for routine lab work.  Continue with physical therapy to improve your strength and balance. As for driving, we recommend that you start slow and only when you feel comfortable.   Consider management of OSA with either CPAP or dental appliance if appropriate. Discuss with managing provider.   Please make sure you are staying well hydrated. I recommend 50-60 ounces daily. Well balanced diet and regular exercise encouraged. Consistent sleep schedule with 6-8 hours recommended.   Please continue follow up with care team as directed.   Follow up with me in 6 months  You may receive a survey regarding today's visit. I encourage you to leave honest feed back as I do use this information to improve patient care. Thank you for seeing me today!

## 2021-02-13 ENCOUNTER — Other Ambulatory Visit (INDEPENDENT_AMBULATORY_CARE_PROVIDER_SITE_OTHER): Payer: Self-pay

## 2021-02-13 DIAGNOSIS — E871 Hypo-osmolality and hyponatremia: Secondary | ICD-10-CM | POA: Diagnosis not present

## 2021-02-13 DIAGNOSIS — I2510 Atherosclerotic heart disease of native coronary artery without angina pectoris: Secondary | ICD-10-CM | POA: Diagnosis not present

## 2021-02-13 DIAGNOSIS — I69320 Aphasia following cerebral infarction: Secondary | ICD-10-CM | POA: Diagnosis not present

## 2021-02-13 DIAGNOSIS — Z0289 Encounter for other administrative examinations: Secondary | ICD-10-CM

## 2021-02-13 DIAGNOSIS — I5020 Unspecified systolic (congestive) heart failure: Secondary | ICD-10-CM | POA: Diagnosis not present

## 2021-02-13 DIAGNOSIS — M069 Rheumatoid arthritis, unspecified: Secondary | ICD-10-CM | POA: Diagnosis not present

## 2021-02-13 DIAGNOSIS — G9340 Encephalopathy, unspecified: Secondary | ICD-10-CM | POA: Diagnosis not present

## 2021-02-13 DIAGNOSIS — C3412 Malignant neoplasm of upper lobe, left bronchus or lung: Secondary | ICD-10-CM | POA: Diagnosis not present

## 2021-02-13 DIAGNOSIS — I429 Cardiomyopathy, unspecified: Secondary | ICD-10-CM | POA: Diagnosis not present

## 2021-02-13 DIAGNOSIS — I6330 Cerebral infarction due to thrombosis of unspecified cerebral artery: Secondary | ICD-10-CM | POA: Diagnosis not present

## 2021-02-13 DIAGNOSIS — I10 Essential (primary) hypertension: Secondary | ICD-10-CM | POA: Diagnosis not present

## 2021-02-14 LAB — BASIC METABOLIC PANEL
BUN/Creatinine Ratio: 33 — ABNORMAL HIGH (ref 12–28)
BUN: 24 mg/dL (ref 8–27)
CO2: 22 mmol/L (ref 20–29)
Calcium: 9.6 mg/dL (ref 8.7–10.3)
Chloride: 96 mmol/L (ref 96–106)
Creatinine, Ser: 0.73 mg/dL (ref 0.57–1.00)
Glucose: 101 mg/dL — ABNORMAL HIGH (ref 70–99)
Potassium: 4.6 mmol/L (ref 3.5–5.2)
Sodium: 134 mmol/L (ref 134–144)
eGFR: 83 mL/min/{1.73_m2} (ref >59)

## 2021-02-14 LAB — CBC WITH DIFFERENTIAL/PLATELET
Basophils Absolute: 0 10*3/uL (ref 0.0–0.2)
Basos: 1 %
EOS (ABSOLUTE): 0.2 10*3/uL (ref 0.0–0.4)
Eos: 4 %
Hematocrit: 33.5 % — ABNORMAL LOW (ref 34.0–46.6)
Hemoglobin: 10.7 g/dL — ABNORMAL LOW (ref 11.1–15.9)
Immature Grans (Abs): 0 10*3/uL (ref 0.0–0.1)
Immature Granulocytes: 0 %
Lymphocytes Absolute: 1.3 10*3/uL (ref 0.7–3.1)
Lymphs: 24 %
MCH: 30.2 pg (ref 26.6–33.0)
MCHC: 31.9 g/dL (ref 31.5–35.7)
MCV: 95 fL (ref 79–97)
Monocytes Absolute: 0.9 10*3/uL (ref 0.1–0.9)
Monocytes: 17 %
Neutrophils Absolute: 2.9 10*3/uL (ref 1.4–7.0)
Neutrophils: 54 %
Platelets: 178 10*3/uL (ref 150–450)
RBC: 3.54 x10E6/uL — ABNORMAL LOW (ref 3.77–5.28)
RDW: 14 % (ref 11.7–15.4)
WBC: 5.3 10*3/uL (ref 3.4–10.8)

## 2021-02-20 DIAGNOSIS — E7800 Pure hypercholesterolemia, unspecified: Secondary | ICD-10-CM | POA: Diagnosis not present

## 2021-02-20 DIAGNOSIS — I6930 Unspecified sequelae of cerebral infarction: Secondary | ICD-10-CM | POA: Diagnosis not present

## 2021-02-20 DIAGNOSIS — M069 Rheumatoid arthritis, unspecified: Secondary | ICD-10-CM | POA: Diagnosis not present

## 2021-02-20 DIAGNOSIS — E871 Hypo-osmolality and hyponatremia: Secondary | ICD-10-CM | POA: Diagnosis not present

## 2021-02-20 DIAGNOSIS — I5020 Unspecified systolic (congestive) heart failure: Secondary | ICD-10-CM | POA: Diagnosis not present

## 2021-02-20 DIAGNOSIS — M810 Age-related osteoporosis without current pathological fracture: Secondary | ICD-10-CM | POA: Diagnosis not present

## 2021-02-20 DIAGNOSIS — I110 Hypertensive heart disease with heart failure: Secondary | ICD-10-CM | POA: Diagnosis not present

## 2021-02-20 DIAGNOSIS — G4733 Obstructive sleep apnea (adult) (pediatric): Secondary | ICD-10-CM | POA: Diagnosis not present

## 2021-02-20 DIAGNOSIS — C3492 Malignant neoplasm of unspecified part of left bronchus or lung: Secondary | ICD-10-CM | POA: Diagnosis not present

## 2021-02-20 DIAGNOSIS — F39 Unspecified mood [affective] disorder: Secondary | ICD-10-CM | POA: Diagnosis not present

## 2021-02-20 DIAGNOSIS — I10 Essential (primary) hypertension: Secondary | ICD-10-CM | POA: Diagnosis not present

## 2021-02-20 DIAGNOSIS — I700 Atherosclerosis of aorta: Secondary | ICD-10-CM | POA: Diagnosis not present

## 2021-02-20 NOTE — Progress Notes (Signed)
HPI: FU CM. Pt with H/O dyspnea; also with h/o lung ca, COPD and OSA. Cardiac catheterization October 2018 showed mild nonobstructive coronary disease and normal filling pressures. Echocardiogram repeated March 2019 and showed ejection fraction 30-35% and mild left atrial enlargement. Declined ICD previously. Monitor September 2021 showed sinus bradycardia, normal sinus rhythm, occasional PAC, 4 and 6 beats of PAT and occasional PVC. Toprol was decreased due to bradycardia.  Patient had CVA December 2022 likely secondary to small vessel disease.  Echocardiogram December 2022 showed ejection fraction 45 to 50%, mild left ventricular hypertrophy, grade 1 diastolic dysfunction.  Patient initiated on dual antiplatelet therapy.  Since last seen she is in physical therapy for her recent CVA.  She has some fatigue.  She has chronic dyspnea unchanged.  No orthopnea, PND, pedal edema, chest pain or syncope.  Current Outpatient Medications  Medication Sig Dispense Refill   albuterol (VENTOLIN HFA) 108 (90 Base) MCG/ACT inhaler INHALE 2 PUFFS BY MOUTH IN EACH NOSTRIL EVERY 4 TO 6 HOURS AS NEEDED (Patient taking differently: Inhale 2 puffs into the lungs See admin instructions. Inhale 2 puffs into the lungs every 4-6 hours as needed for wheezing or shortness of breath) 18 g 0   AMBULATORY NON FORMULARY MEDICATION Take 1 capsule by mouth See admin instructions. Synogut probiotic capsules- Take 1 capsule by mouth in the morning and at bedtime     atorvastatin (LIPITOR) 40 MG tablet Take 1 tablet (40 mg total) by mouth daily. 30 tablet 3   CALCIUM-VITAMIN D PO Take 1 tablet by mouth daily.     candesartan (ATACAND) 8 MG tablet TAKE 1 TABLET(8 MG) BY MOUTH DAILY 90 tablet 3   clopidogrel (PLAVIX) 75 MG tablet Take 1 tablet (75 mg total) by mouth daily. 30 tablet 3   colestipol (COLESTID) 1 g tablet Take 2 tablets (2 g total) by mouth 2 (two) times daily. 60 tablet 2   denosumab (PROLIA) 60 MG/ML SOSY injection  Inject 60 mg into the skin every 6 (six) months.     famotidine (PEPCID) 40 MG tablet Take 40 mg by mouth every evening.     fluticasone (FLONASE) 50 MCG/ACT nasal spray Place 1-2 sprays into both nostrils daily as needed for allergies or rhinitis.     Fluticasone-Umeclidin-Vilant (TRELEGY ELLIPTA) 100-62.5-25 MCG/ACT AEPB Inhale 1 puff into the lungs daily. 60 each 5   furosemide (LASIX) 40 MG tablet Take 1/2 tablets (20 mg total) by mouth daily. 30 tablet 0   leflunomide (ARAVA) 20 MG tablet Take 20 mg by mouth daily.     METAMUCIL FIBER PO Take by mouth See admin instructions. Mix 1 tablespoonful of powder into 4-8 ounces of desired beverage and drink once a day     metoprolol succinate (TOPROL-XL) 25 MG 24 hr tablet TAKE 1 TABLET(25 MG) BY MOUTH DAILY (Patient taking differently: Take 25 mg by mouth in the morning.) 90 tablet 3   predniSONE (DELTASONE) 5 MG tablet Take 5 mg by mouth daily as needed (AS DIRECTED- for R.A. flares).     vitamin B-12 (CYANOCOBALAMIN) 1000 MCG tablet Take 1 tablet (1,000 mcg total) by mouth daily. 30 tablet 0   No current facility-administered medications for this visit.     Past Medical History:  Diagnosis Date   Abnormal CT scan    Allergic rhinitis    Anemia 2015   after lung surgery   Angio-edema    Complication of anesthesia    Fentanyl allergy, "claustrophobia"  COPD (chronic obstructive pulmonary disease) ([place])    Depression    Diaphoresis 07/30/2015   Excessive sweating with little exertion   Difficult intravenous access    GERD (gastroesophageal reflux disease)    In the past   Hyperlipidemia    Hypertension    Left bundle branch block    Lung cancer ([place]) 2015   left upper lobe wedge removed-no further tx.   Neuropathy    Tingling in arms, fingers, and feet (Since 06/15/16)   Obesity    OSA (obstructive sleep apnea)    denies.   Osteoarthritis    ra also   Osteoporosis    Recurrent upper respiratory infection (URI)    Rheumatoid  arthritis(714.0)    Dr. Amil Amen   SOB (shortness of breath) on exertion    Uterine cancer ([place]) 1971   tx surgical    Past Surgical History:  Procedure Laterality Date   ABDOMINAL HYSTERECTOMY     in situ carcinoma partial   CARDIAC CATHETERIZATION  01/27/11   minor non-obs CAD, NL EF, mild pulm HTN   CATARACT EXTRACTION, BILATERAL     last done 12'16-   COLONOSCOPY W/ BIOPSIES AND POLYPECTOMY     COLONOSCOPY WITH PROPOFOL N/A 02/13/2015   Procedure: COLONOSCOPY WITH PROPOFOL;  Surgeon: Laurence Spates, MD;  [facility];  Service: Endoscopy;  Laterality: N/A;   ESOPHAGOGASTRODUODENOSCOPY (EGD) WITH PROPOFOL N/A 09/30/2016   Procedure: ESOPHAGOGASTRODUODENOSCOPY (EGD) WITH PROPOFOL;  Surgeon: Laurence Spates, MD;  [facility];  Service: Endoscopy;  Laterality: N/A;   LYMPH NODE DISSECTION Left 04/12/2013   Procedure: LYMPH NODE DISSECTION;  Surgeon: [name], MD;  Location: [facility];  Service: Thoracic;  Laterality: Left;   RIGHT/LEFT HEART CATH AND CORONARY ANGIOGRAPHY N/A 10/08/2016   Procedure: RIGHT/LEFT HEART CATH AND CORONARY ANGIOGRAPHY;  Surgeon: Burnell Blanks, MD;  Location: [facility];  Service: Cardiovascular;  Laterality: N/A;   TONSILLECTOMY     VESICOVAGINAL FISTULA CLOSURE W/ TAH  1971   VIDEO ASSISTED THORACOSCOPY (VATS)/THOROCOTOMY Left 04/12/2013   Procedure: VIDEO ASSISTED THORACOSCOPY (VATS)/THOROCOTOMY, WITH LEFT UPPER LOBE WEDGE RESECTION, CHEST WALL BIOPSY ;  Surgeon: [name], MD;  Location: [facility];  Service: Thoracic;  Laterality: Left;   VIDEO BRONCHOSCOPY N/A 04/12/2013   Procedure: VIDEO BRONCHOSCOPY;  Surgeon: [name], MD;  Location: [facility] OR;  Service: Thoracic;  Laterality: N/A;    Social History   Socioeconomic History   Marital status: Widowed    Spouse name: Not on file   Number of children: 2   Years of education: College   Highest education level: Master's degree (e.g., MA, MS, MEng, MEd, MSW,  MBA)  Occupational History   Occupation: Nurse, adult: [employer]  Tobacco Use   Smoking status: Former    Packs/day: 1.00    Years: 35.00    Pack years: 35.00    Types: Cigarettes    Quit date: 01/07/1988    Years since quitting: 33.1   Smokeless tobacco: Never  Vaping Use   Vaping Use: Never used  Substance and Sexual Activity   Alcohol use: No    Comment: not since 1988   Drug use: No   Sexual activity: Never  Other Topics Concern   Not on file  Social History Narrative   Not on file   Social Determinants of Health   Financial Resource Strain: Not on file  Food Insecurity: Not on file  Transportation Needs: Not on file  Physical Activity: Not on file  Stress: Not on file  Social Connections: Not on file  Intimate Partner Violence: Not on file    Family History  Problem Relation Age of Onset   Lung cancer Mother    Coronary artery disease Son    Heart attack Son 53   Arthritis/Rheumatoid Maternal Grandmother    Coronary artery disease Son    Arthritis/Rheumatoid Son     ROS: Generalized fatigue but no fevers or chills, productive cough, hemoptysis, dysphasia, odynophagia, melena, hematochezia, dysuria, hematuria, rash, seizure activity, orthopnea, PND, pedal edema, claudication. Remaining systems are negative.  Physical Exam: Well-developed well-nourished in no acute distress.  Skin is warm and dry.  HEENT is normal.  Neck is supple.  Chest is clear to auscultation with normal expansion.  Cardiovascular exam is regular rate and rhythm.  Abdominal exam nontender or distended. No masses palpated. Extremities show no edema. neuro grossly intact  A/P  1 nonischemic cardiomyopathy-ejection fraction 45 to 50% on most recent echocardiogram.  Continue ARB and Toprol.  2 recent CVA-felt secondary to small vessel disease.  Continue dual antiplatelet therapy.  3 hypertension-blood pressure controlled.  Continue present medications.  4 hyperlipidemia-continue  statin.  5 chronic combined systolic/diastolic congestive heart failure-she is euvolemic.  We will continue diuretic at present dose.  6 dyspnea-this is felt secondary to a combination of previous lung resection secondary to lung cancer, COPD, obstructive sleep apnea and obesity hypoventilation syndrome.  7 normocytic anemia-we will check anemia panel.  Kirk Ruths, MD

## 2021-03-04 ENCOUNTER — Other Ambulatory Visit: Payer: Self-pay

## 2021-03-04 ENCOUNTER — Ambulatory Visit (INDEPENDENT_AMBULATORY_CARE_PROVIDER_SITE_OTHER): Payer: HMO | Admitting: Cardiology

## 2021-03-04 ENCOUNTER — Encounter: Payer: Self-pay | Admitting: Cardiology

## 2021-03-04 VITALS — BP 107/66 | HR 76 | Ht 62.0 in | Wt 162.0 lb

## 2021-03-04 DIAGNOSIS — D649 Anemia, unspecified: Secondary | ICD-10-CM

## 2021-03-04 DIAGNOSIS — I5043 Acute on chronic combined systolic (congestive) and diastolic (congestive) heart failure: Secondary | ICD-10-CM | POA: Diagnosis not present

## 2021-03-04 DIAGNOSIS — E7800 Pure hypercholesterolemia, unspecified: Secondary | ICD-10-CM

## 2021-03-04 DIAGNOSIS — I428 Other cardiomyopathies: Secondary | ICD-10-CM | POA: Diagnosis not present

## 2021-03-04 DIAGNOSIS — I10 Essential (primary) hypertension: Secondary | ICD-10-CM | POA: Diagnosis not present

## 2021-03-04 NOTE — Patient Instructions (Signed)

## 2021-03-05 ENCOUNTER — Encounter: Payer: Self-pay | Admitting: Gastroenterology

## 2021-03-05 LAB — IRON AND TIBC
Iron Saturation: 24 % (ref 15–55)
Iron: 82 ug/dL (ref 27–139)
Total Iron Binding Capacity: 340 ug/dL (ref 250–450)
UIBC: 258 ug/dL (ref 118–369)

## 2021-03-05 LAB — VITAMIN B12: Vitamin B-12: 472 pg/mL (ref 232–1245)

## 2021-03-05 LAB — FOLATE: Folate: 10 ng/mL (ref >3.0)

## 2021-03-05 LAB — FERRITIN: Ferritin: 58 ng/mL (ref 15–150)

## 2021-03-06 DIAGNOSIS — I2510 Atherosclerotic heart disease of native coronary artery without angina pectoris: Secondary | ICD-10-CM | POA: Diagnosis not present

## 2021-03-06 DIAGNOSIS — G9340 Encephalopathy, unspecified: Secondary | ICD-10-CM | POA: Diagnosis not present

## 2021-03-06 DIAGNOSIS — I69828 Other speech and language deficits following other cerebrovascular disease: Secondary | ICD-10-CM | POA: Diagnosis not present

## 2021-03-06 DIAGNOSIS — I69320 Aphasia following cerebral infarction: Secondary | ICD-10-CM | POA: Diagnosis not present

## 2021-03-06 DIAGNOSIS — D638 Anemia in other chronic diseases classified elsewhere: Secondary | ICD-10-CM | POA: Diagnosis not present

## 2021-03-06 DIAGNOSIS — I10 Essential (primary) hypertension: Secondary | ICD-10-CM | POA: Diagnosis not present

## 2021-03-06 DIAGNOSIS — K219 Gastro-esophageal reflux disease without esophagitis: Secondary | ICD-10-CM | POA: Diagnosis not present

## 2021-03-06 DIAGNOSIS — C3412 Malignant neoplasm of upper lobe, left bronchus or lung: Secondary | ICD-10-CM | POA: Diagnosis not present

## 2021-03-06 DIAGNOSIS — M069 Rheumatoid arthritis, unspecified: Secondary | ICD-10-CM | POA: Diagnosis not present

## 2021-03-06 DIAGNOSIS — I429 Cardiomyopathy, unspecified: Secondary | ICD-10-CM | POA: Diagnosis not present

## 2021-03-06 DIAGNOSIS — I5020 Unspecified systolic (congestive) heart failure: Secondary | ICD-10-CM | POA: Diagnosis not present

## 2021-03-06 DIAGNOSIS — J449 Chronic obstructive pulmonary disease, unspecified: Secondary | ICD-10-CM | POA: Diagnosis not present

## 2021-03-07 ENCOUNTER — Telehealth: Payer: Self-pay | Admitting: Family Medicine

## 2021-03-07 DIAGNOSIS — I429 Cardiomyopathy, unspecified: Secondary | ICD-10-CM | POA: Diagnosis not present

## 2021-03-07 DIAGNOSIS — I69828 Other speech and language deficits following other cerebrovascular disease: Secondary | ICD-10-CM | POA: Diagnosis not present

## 2021-03-07 DIAGNOSIS — C3412 Malignant neoplasm of upper lobe, left bronchus or lung: Secondary | ICD-10-CM | POA: Diagnosis not present

## 2021-03-07 DIAGNOSIS — G9340 Encephalopathy, unspecified: Secondary | ICD-10-CM | POA: Diagnosis not present

## 2021-03-07 DIAGNOSIS — I5020 Unspecified systolic (congestive) heart failure: Secondary | ICD-10-CM | POA: Diagnosis not present

## 2021-03-07 DIAGNOSIS — J449 Chronic obstructive pulmonary disease, unspecified: Secondary | ICD-10-CM | POA: Diagnosis not present

## 2021-03-07 DIAGNOSIS — I2510 Atherosclerotic heart disease of native coronary artery without angina pectoris: Secondary | ICD-10-CM | POA: Diagnosis not present

## 2021-03-07 DIAGNOSIS — M069 Rheumatoid arthritis, unspecified: Secondary | ICD-10-CM | POA: Diagnosis not present

## 2021-03-07 DIAGNOSIS — I69320 Aphasia following cerebral infarction: Secondary | ICD-10-CM | POA: Diagnosis not present

## 2021-03-07 DIAGNOSIS — I10 Essential (primary) hypertension: Secondary | ICD-10-CM | POA: Diagnosis not present

## 2021-03-07 NOTE — Telephone Encounter (Signed)
Returned called and LVM. Per last OV with Amy,NP she recommend that pt starts slow and only when feeling comfortable. provided callback # if she had any further questions.  ?

## 2021-03-07 NOTE — Telephone Encounter (Signed)
PT Charise @ [facility] states they have been working with pt, she is getting better.  Charise would like to know the recommendations on pt driving.  Charise's vm is secure if a vm needs to be left.

## 2021-03-19 DIAGNOSIS — I10 Essential (primary) hypertension: Secondary | ICD-10-CM | POA: Diagnosis not present

## 2021-03-19 DIAGNOSIS — I5020 Unspecified systolic (congestive) heart failure: Secondary | ICD-10-CM | POA: Diagnosis not present

## 2021-03-19 DIAGNOSIS — D649 Anemia, unspecified: Secondary | ICD-10-CM | POA: Diagnosis not present

## 2021-03-19 DIAGNOSIS — R159 Full incontinence of feces: Secondary | ICD-10-CM | POA: Diagnosis not present

## 2021-03-19 DIAGNOSIS — E871 Hypo-osmolality and hyponatremia: Secondary | ICD-10-CM | POA: Diagnosis not present

## 2021-03-19 DIAGNOSIS — I6930 Unspecified sequelae of cerebral infarction: Secondary | ICD-10-CM | POA: Diagnosis not present

## 2021-03-20 DIAGNOSIS — H43813 Vitreous degeneration, bilateral: Secondary | ICD-10-CM | POA: Diagnosis not present

## 2021-03-25 DIAGNOSIS — H539 Unspecified visual disturbance: Secondary | ICD-10-CM | POA: Diagnosis not present

## 2021-03-25 DIAGNOSIS — G43909 Migraine, unspecified, not intractable, without status migrainosus: Secondary | ICD-10-CM | POA: Diagnosis not present

## 2021-03-25 DIAGNOSIS — Z8673 Personal history of transient ischemic attack (TIA), and cerebral infarction without residual deficits: Secondary | ICD-10-CM | POA: Diagnosis not present

## 2021-03-27 ENCOUNTER — Encounter: Payer: Self-pay | Admitting: Gastroenterology

## 2021-03-27 ENCOUNTER — Other Ambulatory Visit: Payer: HMO

## 2021-03-27 ENCOUNTER — Ambulatory Visit: Payer: HMO | Admitting: Gastroenterology

## 2021-03-27 VITALS — BP 110/66 | HR 64 | Ht 62.0 in | Wt 161.0 lb

## 2021-03-27 DIAGNOSIS — R932 Abnormal findings on diagnostic imaging of liver and biliary tract: Secondary | ICD-10-CM | POA: Diagnosis not present

## 2021-03-27 DIAGNOSIS — R197 Diarrhea, unspecified: Secondary | ICD-10-CM | POA: Diagnosis not present

## 2021-03-27 DIAGNOSIS — R152 Fecal urgency: Secondary | ICD-10-CM | POA: Diagnosis not present

## 2021-03-27 DIAGNOSIS — R159 Full incontinence of feces: Secondary | ICD-10-CM | POA: Diagnosis not present

## 2021-03-27 MED ORDER — CHOLESTYRAMINE 4 G PO PACK: 4.0000 g | PACK | Freq: Two times a day (BID) | ORAL | 11 refills | Status: DC

## 2021-03-27 NOTE — Patient Instructions (Addendum)
It was my pleasure to provide care to you today. Based on our discussion, I am providing you with my recommendations below: ? ?RECOMMENDATION(S):  ? ?Continue Metamucil with increasing dosing frequency to BID. ? ?LABS:  ? ?Please proceed to the basement level for lab work before leaving today. Press "B" on the elevator. The lab is located at the first door on the left as you exit the elevator. ? ?HEALTHCARE LAWS AND MY CHART RESULTS:  ? ?Due to recent changes in healthcare laws, you may see results of your imaging and/or laboratory studies on MyChart before I have had a chance to review them.  I understand that in some cases there may be results that are confusing or concerning to you. Please understand that not all results are received at the same time and often I may need to interpret multiple results in order to provide you with the best plan of care or course of treatment. Therefore, I ask that you please give me 48 hours to thoroughly review all your results before contacting my office for clarification.  ? ?PRESCRIPTION MEDICATION(S):  ? ?We have sent the following medication(s) to your pharmacy: ? ?Cholestyramine 4 mg twice daily.  ? ?NOTE: If your medication(s) requires a PRIOR AUTHORIZATION, we will receive notification from your pharmacy. Once received, the process to submit for approval may take up to 7-10 business days. You will be contacted about any denials we have received from your insurance company as well as alternatives recommended by your provider. ? ? ?FOLLOW UP: ? ?I would like for you to follow up with me as needed. Please call the office at [phone] to schedule your appointment. ? ?BMI: ? ?If you are age 42 or older, your body mass index should be between 23-30. Your Body mass index is 29.45 kg/m?Marland Kitchen If this is out of the aforementioned range listed, please consider follow up with your Primary Care Provider. ? ?MY CHART: ? ?The [facility] providers would like to encourage you to use  [facility] to communicate with providers for non-urgent requests or questions.  Due to long hold times on the telephone, sending your provider a message by [facility] may be a faster and more efficient way to get a response.  Please allow 48 business hours for a response.  Please remember that this is for non-urgent requests.  ? ?Thank you for trusting me with your gastrointestinal care!   ? ?Thornton Park, MD, MPH ? ?

## 2021-03-27 NOTE — Progress Notes (Signed)
Referring Provider: Kelton Pillar, MD Primary Care Physician:  Kelton Pillar, MD   Chief Complaint: Diarrhea and fecal incontinence   IMPRESSION:  Fecal incontinence: Previously evaluated and worked-up by Dr. Oletta Lamas and Dr. Michail Sermon. They referred her to [facility] after declining colonoscopy, but, she did not keep that appointment. Has denied any further colonoscopy because she feels she is too high risk. We discussed cholestyramine as an alternative to colestipol. However, I've asked her to reconsider stools studies and a colonoscopy with random biopsies so that we treat her appropriately.  Must consider carbohydrate intolerance particularly given her trigger by eating sugar, bacterial overgrowth, diet, medications, bile salt malabsorption, autonomic dysfunction, IBS, microscopic colitis, and IBD.   I have made it clear that it is difficult for me to make medical recommendations without results from a colonoscopy. And, we may have to consider medications that come with a higher risk for empiric treatment.   Liver lesion identified on CT and confirmed on MRI. By MRI, most likely hemangioma versus FNH. There are no associated symptoms. Given the potential increase in size compared to 2018, repeating imaging recommended in 6 months, although malignant transformation is unusual with both of these diagnoses.  She does not want to proceed at this time due to cost.   PLAN: - Continue Metamucil with increasing dosing frequency to BID - Stool for GI pathogen panel, fecal calprotectin, and fecal elastase - Trial of cholestyramine 4 g BID  - She has declined colonoscopy and anorectal manometry - Surveillance MRI to be considered - she is not interested given the cost - Follow-up PRN  I spent over 45 minutes, including in depth chart review, independent review of results, communicating results with the patient directly, face-to-face time with the patient, ordering studies and medications as  appropriate, and documentation.      HPI: Taylor Berry is a 81 y.o. female who returns today requesting alternating to colestipol for her diarrhea. She was last seen 12/11/20 when self-referred for hepatic cysts.    She has severe COPD, obesity with untreated OSA, rheumatoid arthritis, uterine cancer, CHF, anxiety, hypertension, and depression. She has a history of alcoholism.  She had squamous cell lung cancer treated with a left upper lobe wedge resection. Dr. Malvin Johns has been following nodular disease by CT chest with slow increase in size in a medial right lower lobe nodule since CT chest.  Dr. Malvin Johns reached out to me and asked me to expedite her evaluation.  Prior GI care had been through [facility] with Dr. Percell Miller and Dr. Michail Sermon. She was last seen by [facility] in the summer of 2022.  They last referred her to a specialist at [facility].   Dedicated CT abd/pelvis with contrast 09/28/2020 showed a 1.9 x 1.5 cm incompletely characterized hyperenhancing lesion in the liver.  Gallstones were also present.  MRI with and without contrast 11/09/2020 confirmed a 2.3 x 1.7 cm lesion in hepatic segment 5 abutting the gallbladder fossa with an enhancement pattern consistent with focal nodular hyperplasia or possibly hemangioma.  The radiologist felt this lesion was subtly present on a CT in 2018 although it has probably increased in size. Radiologist recommended repeating the study in 6 months.   Liver enzymes 10/03/2016 were normal and most recently 10/30/20 with AST 22, ALT 9, alk phos 37, TB 0.6  However, at the time of her initial visit 12/11/20 her primary concern was fecal incontinence with leakage of semiformed stool. Some concurrent urinary incontinence. She reported 2  years of diarrhea despite using Metamucil. She has awareness of the need to defecation. She has to wear pads due to the soiling. No sense of complete evacuation. She was previously taking colestipol 2g daily for a couple of years and  probiotics/prebiotics and did not find it helpful. No blood or mucous in the stool.  No abdominal pain or flatus. No nocturnal episodes. No identified precipitating events. No recent change in stool consistency.  Decreased rectal sensation.  No history of prior hemorrhoid, fissure or anorectal surgery, pelvic irradiation, or neurologic disease.   At the time of her last visit with Dr. Michail Sermon, he recommended a colonoscopy. However, she didn't feel that she could get through a colonoscopy due to the diarrhea that she experienced after her CT. No nocturnal episodes.  Symptoms may be worse by eating sugar.   Labs in Epic show negative Alpha-Gal testing in 2019.   She reports Dr. Laurann Montana performed stool studies in April.Those results are not available to me today.  Returns today. She did not have the colonoscopy as discussed at the time of her last visit because she had a stroke. She states she will never have one. As of January, the colestipol is now too expensive for her. Her diarrhea was controlled while using colestipol. She's having increasing fecal incontinence and is having a few leaks daily since she stopped the medication when she ran out of it. As of January it is $300 a month and this is not an option for her due to cost.  She is resistant to stool studies because she feels these have been done before.   In addition, she did not want to discuss her liver lesions today. Despite our review that the lesions may be larger in size on recent imaging, she does not want additional imaging due to cost.    Endoscopic history: - She reports multiple prior colonoscopies -Surveillance colonoscopy with Dr. Oletta Lamas 02/13/2015 for history of colon polyps: Extensive pancolonic diverticulosis and internal hemorrhoids.  Given her age surveillance colonoscopy was not recommended. -EGD with Dr. Oletta Lamas 09/30/2016 for abnormal imaging of the GI tract showed a small hiatal hernia, normal gastric mucosa, and a few  small nodules in the duodenal bulb thought to be duodenitis.  Duodenal biopsy showed peptic duodenitis with Brunner's gland hyperplasia.  Past Medical History:  Diagnosis Date   Abnormal CT scan    Allergic rhinitis    Anemia 2015   after lung surgery   Angio-edema    Complication of anesthesia    Fentanyl allergy, "claustrophobia"   COPD (chronic obstructive pulmonary disease) ([place])    Depression    Diaphoresis 07/30/2015   Excessive sweating with little exertion   Difficult intravenous access    GERD (gastroesophageal reflux disease)    In the past   Hyperlipidemia    Hypertension    Left bundle branch block    Lung cancer ([place]) 2015   left upper lobe wedge removed-no further tx.   Neuropathy    Tingling in arms, fingers, and feet (Since 06/15/16)   Obesity    OSA (obstructive sleep apnea)    denies.   Osteoarthritis    ra also   Osteoporosis    Recurrent upper respiratory infection (URI)    Rheumatoid arthritis(714.0)    Dr. Amil Amen   SOB (shortness of breath) on exertion    Uterine cancer ([place]) 1971   tx surgical    Past Surgical History:  Procedure Laterality Date   ABDOMINAL HYSTERECTOMY  in situ carcinoma partial   CARDIAC CATHETERIZATION  01/27/11   minor non-obs CAD, NL EF, mild pulm HTN   CATARACT EXTRACTION, BILATERAL     last done 12'16-   COLONOSCOPY W/ BIOPSIES AND POLYPECTOMY     COLONOSCOPY WITH PROPOFOL N/A 02/13/2015   Procedure: COLONOSCOPY WITH PROPOFOL;  Surgeon: Laurence Spates, MD;  Location: [facility];  Service: Endoscopy;  Laterality: N/A;   ESOPHAGOGASTRODUODENOSCOPY (EGD) WITH PROPOFOL N/A 09/30/2016   Procedure: ESOPHAGOGASTRODUODENOSCOPY (EGD) WITH PROPOFOL;  Surgeon: Laurence Spates, MD;  Location: [facility];  Service: Endoscopy;  Laterality: N/A;   LYMPH NODE DISSECTION Left 04/12/2013   Procedure: LYMPH NODE DISSECTION;  Surgeon: [name], MD;  Location: [place];  Service: Thoracic;  Laterality: Left;   RIGHT/LEFT HEART CATH  AND CORONARY ANGIOGRAPHY N/A 10/08/2016   Procedure: RIGHT/LEFT HEART CATH AND CORONARY ANGIOGRAPHY;  Surgeon: Burnell Blanks, MD;  Location: [facility];  Service: Cardiovascular;  Laterality: N/A;   TONSILLECTOMY     VESICOVAGINAL FISTULA CLOSURE W/ TAH  1971   VIDEO ASSISTED THORACOSCOPY (VATS)/THOROCOTOMY Left 04/12/2013   Procedure: VIDEO ASSISTED THORACOSCOPY (VATS)/THOROCOTOMY, WITH LEFT UPPER LOBE WEDGE RESECTION, CHEST WALL BIOPSY ;  Surgeon: [name], MD;  Location: [facility];  Service: Thoracic;  Laterality: Left;   VIDEO BRONCHOSCOPY N/A 04/12/2013   Procedure: VIDEO BRONCHOSCOPY;  Surgeon: [name], MD;  Location: [facility];  Service: Thoracic;  Laterality: N/A;    Current Outpatient Medications  Medication Sig Dispense Refill   albuterol (VENTOLIN HFA) 108 (90 Base) MCG/ACT inhaler INHALE 2 PUFFS BY MOUTH IN EACH NOSTRIL EVERY 4 TO 6 HOURS AS NEEDED (Patient taking differently: Inhale 2 puffs into the lungs See admin instructions. Inhale 2 puffs into the lungs every 4-6 hours as needed for wheezing or shortness of breath) 18 g 0   AMBULATORY NON FORMULARY MEDICATION Take 1 capsule by mouth See admin instructions. Synogut probiotic capsules- Take 1 capsule by mouth in the morning and at bedtime     atorvastatin (LIPITOR) 40 MG tablet Take 1 tablet (40 mg total) by mouth daily. 30 tablet 3   CALCIUM-VITAMIN D PO Take 1 tablet by mouth daily.     candesartan (ATACAND) 8 MG tablet TAKE 1 TABLET(8 MG) BY MOUTH DAILY 90 tablet 3   clopidogrel (PLAVIX) 75 MG tablet Take 1 tablet (75 mg total) by mouth daily. 30 tablet 3   denosumab (PROLIA) 60 MG/ML SOSY injection Inject 60 mg into the skin every 6 (six) months.     famotidine (PEPCID) 40 MG tablet Take 40 mg by mouth every evening.     fluticasone (FLONASE) 50 MCG/ACT nasal spray Place 1-2 sprays into both nostrils daily as needed for allergies or rhinitis.     Fluticasone-Umeclidin-Vilant (TRELEGY ELLIPTA)  100-62.5-25 MCG/ACT AEPB Inhale 1 puff into the lungs daily. 60 each 5   furosemide (LASIX) 40 MG tablet Take 1/2 tablets (20 mg total) by mouth daily. 30 tablet 0   leflunomide (ARAVA) 20 MG tablet Take 20 mg by mouth daily.     METAMUCIL FIBER PO Take by mouth See admin instructions. Mix 1 tablespoonful of powder into 4-8 ounces of desired beverage and drink once a day     metoprolol succinate (TOPROL-XL) 25 MG 24 hr tablet TAKE 1 TABLET(25 MG) BY MOUTH DAILY (Patient taking differently: Take 25 mg by mouth in the morning.) 90 tablet 3   predniSONE (DELTASONE) 5 MG tablet Take 5 mg by mouth daily as  needed (AS DIRECTED- for R.A. flares).     vitamin B-12 (CYANOCOBALAMIN) 1000 MCG tablet Take 1 tablet (1,000 mcg total) by mouth daily. 30 tablet 0   No current facility-administered medications for this visit.    Allergies as of 03/27/2021 - Review Complete 03/27/2021  Allergen Reaction Noted   Latex Itching and Rash 06/27/2009   Ace inhibitors Cough 10/30/2020   Angiotensin receptor blockers Swelling and Other (See Comments) 10/30/2020   Fentanyl Other (See Comments) 01/30/2015   Hydrochlorothiazide Other (See Comments) 10/30/2020   Hydroxychloroquine Other (See Comments) 12/30/2020   Penicillins Other (See Comments) 10/30/2020      Physical Exam: General:   Alert,  well-nourished, pleasant and cooperative in NAD, tearful today when discussing the diarrhea and colonoscopy Head:  Normocephalic and atraumatic. Eyes:  Sclera clear, no icterus.   Conjunctiva pink. Abdomen:  Soft, nontender, nondistended, normal bowel sounds, no rebound or guarding. No hepatosplenomegaly.   Neurologic:  Alert and  oriented x4;  grossly nonfocal Skin:  Intact without significant lesions or rashes. Psych:  Alert and cooperative. Normal mood and affect.   Tonianne Fine L. Tarri Glenn, MD, MPH 03/27/2021, 2:35 PM

## 2021-03-28 ENCOUNTER — Other Ambulatory Visit: Payer: HMO

## 2021-03-28 DIAGNOSIS — R197 Diarrhea, unspecified: Secondary | ICD-10-CM | POA: Diagnosis not present

## 2021-03-29 DIAGNOSIS — M6281 Muscle weakness (generalized): Secondary | ICD-10-CM | POA: Diagnosis not present

## 2021-03-29 DIAGNOSIS — M25552 Pain in left hip: Secondary | ICD-10-CM | POA: Diagnosis not present

## 2021-03-29 DIAGNOSIS — R2689 Other abnormalities of gait and mobility: Secondary | ICD-10-CM | POA: Diagnosis not present

## 2021-04-01 DIAGNOSIS — R2689 Other abnormalities of gait and mobility: Secondary | ICD-10-CM | POA: Diagnosis not present

## 2021-04-01 DIAGNOSIS — M25552 Pain in left hip: Secondary | ICD-10-CM | POA: Diagnosis not present

## 2021-04-01 DIAGNOSIS — M6281 Muscle weakness (generalized): Secondary | ICD-10-CM | POA: Diagnosis not present

## 2021-04-02 ENCOUNTER — Encounter: Payer: Self-pay | Admitting: Gastroenterology

## 2021-04-02 LAB — GI PROFILE, STOOL, PCR

## 2021-04-02 LAB — CALPROTECTIN, FECAL: Calprotectin, Fecal: 18 ug/g (ref 0–120)

## 2021-04-03 DIAGNOSIS — E663 Overweight: Secondary | ICD-10-CM | POA: Diagnosis not present

## 2021-04-03 DIAGNOSIS — M06322 Rheumatoid nodule, left elbow: Secondary | ICD-10-CM | POA: Diagnosis not present

## 2021-04-03 DIAGNOSIS — Z6828 Body mass index (BMI) 28.0-28.9, adult: Secondary | ICD-10-CM | POA: Diagnosis not present

## 2021-04-03 DIAGNOSIS — M1991 Primary osteoarthritis, unspecified site: Secondary | ICD-10-CM | POA: Diagnosis not present

## 2021-04-03 DIAGNOSIS — R682 Dry mouth, unspecified: Secondary | ICD-10-CM | POA: Diagnosis not present

## 2021-04-03 DIAGNOSIS — R0602 Shortness of breath: Secondary | ICD-10-CM | POA: Diagnosis not present

## 2021-04-03 DIAGNOSIS — M25561 Pain in right knee: Secondary | ICD-10-CM | POA: Diagnosis not present

## 2021-04-03 DIAGNOSIS — Z79899 Other long term (current) drug therapy: Secondary | ICD-10-CM | POA: Diagnosis not present

## 2021-04-03 DIAGNOSIS — M25552 Pain in left hip: Secondary | ICD-10-CM | POA: Diagnosis not present

## 2021-04-03 DIAGNOSIS — M0589 Other rheumatoid arthritis with rheumatoid factor of multiple sites: Secondary | ICD-10-CM | POA: Diagnosis not present

## 2021-04-03 DIAGNOSIS — M810 Age-related osteoporosis without current pathological fracture: Secondary | ICD-10-CM | POA: Diagnosis not present

## 2021-04-03 DIAGNOSIS — M25562 Pain in left knee: Secondary | ICD-10-CM | POA: Diagnosis not present

## 2021-04-04 LAB — PANCREATIC ELASTASE, FECAL: Pancreatic Elastase-1, Stool: >500 ug/g

## 2021-04-05 DIAGNOSIS — M6281 Muscle weakness (generalized): Secondary | ICD-10-CM | POA: Diagnosis not present

## 2021-04-05 DIAGNOSIS — R2689 Other abnormalities of gait and mobility: Secondary | ICD-10-CM | POA: Diagnosis not present

## 2021-04-05 DIAGNOSIS — M25552 Pain in left hip: Secondary | ICD-10-CM | POA: Diagnosis not present

## 2021-04-08 DIAGNOSIS — M6281 Muscle weakness (generalized): Secondary | ICD-10-CM | POA: Diagnosis not present

## 2021-04-08 DIAGNOSIS — M25552 Pain in left hip: Secondary | ICD-10-CM | POA: Diagnosis not present

## 2021-04-08 DIAGNOSIS — R2689 Other abnormalities of gait and mobility: Secondary | ICD-10-CM | POA: Diagnosis not present

## 2021-04-10 DIAGNOSIS — L853 Xerosis cutis: Secondary | ICD-10-CM | POA: Diagnosis not present

## 2021-04-10 DIAGNOSIS — L603 Nail dystrophy: Secondary | ICD-10-CM | POA: Diagnosis not present

## 2021-04-10 DIAGNOSIS — Z85828 Personal history of other malignant neoplasm of skin: Secondary | ICD-10-CM | POA: Diagnosis not present

## 2021-04-10 DIAGNOSIS — L821 Other seborrheic keratosis: Secondary | ICD-10-CM | POA: Diagnosis not present

## 2021-04-10 DIAGNOSIS — B351 Tinea unguium: Secondary | ICD-10-CM | POA: Diagnosis not present

## 2021-04-11 DIAGNOSIS — M85841 Other specified disorders of bone density and structure, right hand: Secondary | ICD-10-CM | POA: Diagnosis not present

## 2021-04-11 DIAGNOSIS — M19041 Primary osteoarthritis, right hand: Secondary | ICD-10-CM | POA: Diagnosis not present

## 2021-04-11 DIAGNOSIS — Z981 Arthrodesis status: Secondary | ICD-10-CM | POA: Diagnosis not present

## 2021-04-11 DIAGNOSIS — S63111D Subluxation of metacarpophalangeal joint of right thumb, subsequent encounter: Secondary | ICD-10-CM | POA: Diagnosis not present

## 2021-04-11 DIAGNOSIS — S63211D Subluxation of metacarpophalangeal joint of left index finger, subsequent encounter: Secondary | ICD-10-CM | POA: Diagnosis not present

## 2021-04-11 DIAGNOSIS — M85631 Other cyst of bone, right forearm: Secondary | ICD-10-CM | POA: Diagnosis not present

## 2021-04-11 DIAGNOSIS — M8560 Other cyst of bone, unspecified site: Secondary | ICD-10-CM | POA: Diagnosis not present

## 2021-04-11 DIAGNOSIS — M20011 Mallet finger of right finger(s): Secondary | ICD-10-CM | POA: Diagnosis not present

## 2021-04-12 DIAGNOSIS — M25552 Pain in left hip: Secondary | ICD-10-CM | POA: Diagnosis not present

## 2021-04-12 DIAGNOSIS — R2689 Other abnormalities of gait and mobility: Secondary | ICD-10-CM | POA: Diagnosis not present

## 2021-04-12 DIAGNOSIS — M6281 Muscle weakness (generalized): Secondary | ICD-10-CM | POA: Diagnosis not present

## 2021-04-15 ENCOUNTER — Encounter: Payer: Self-pay | Admitting: Cardiology

## 2021-04-15 DIAGNOSIS — R2689 Other abnormalities of gait and mobility: Secondary | ICD-10-CM | POA: Diagnosis not present

## 2021-04-15 DIAGNOSIS — M25552 Pain in left hip: Secondary | ICD-10-CM | POA: Diagnosis not present

## 2021-04-15 DIAGNOSIS — M6281 Muscle weakness (generalized): Secondary | ICD-10-CM | POA: Diagnosis not present

## 2021-04-15 MED ORDER — FUROSEMIDE 20 MG PO TABS: 20.0000 mg | ORAL_TABLET | Freq: Every day | ORAL | 1 refills | Status: DC

## 2021-04-16 ENCOUNTER — Encounter: Payer: Self-pay | Admitting: Gastroenterology

## 2021-04-17 ENCOUNTER — Telehealth: Payer: Self-pay | Admitting: Allergy and Immunology

## 2021-04-17 NOTE — Telephone Encounter (Signed)
Patient called to request samples of Trilogy. Patient requests call back to discuss. Patient can be reached at [phone] ?

## 2021-04-17 NOTE — Telephone Encounter (Signed)
Samples have been placed up front for pickup. Called and advised the patient. Patient verbalized understanding.  ?

## 2021-04-19 DIAGNOSIS — R2689 Other abnormalities of gait and mobility: Secondary | ICD-10-CM | POA: Diagnosis not present

## 2021-04-19 DIAGNOSIS — M6281 Muscle weakness (generalized): Secondary | ICD-10-CM | POA: Diagnosis not present

## 2021-04-19 DIAGNOSIS — M25552 Pain in left hip: Secondary | ICD-10-CM | POA: Diagnosis not present

## 2021-04-22 DIAGNOSIS — M6281 Muscle weakness (generalized): Secondary | ICD-10-CM | POA: Diagnosis not present

## 2021-04-22 DIAGNOSIS — M25552 Pain in left hip: Secondary | ICD-10-CM | POA: Diagnosis not present

## 2021-04-22 DIAGNOSIS — R2689 Other abnormalities of gait and mobility: Secondary | ICD-10-CM | POA: Diagnosis not present

## 2021-04-26 DIAGNOSIS — M6281 Muscle weakness (generalized): Secondary | ICD-10-CM | POA: Diagnosis not present

## 2021-04-26 DIAGNOSIS — R2689 Other abnormalities of gait and mobility: Secondary | ICD-10-CM | POA: Diagnosis not present

## 2021-04-26 DIAGNOSIS — M25552 Pain in left hip: Secondary | ICD-10-CM | POA: Diagnosis not present

## 2021-04-29 DIAGNOSIS — R2689 Other abnormalities of gait and mobility: Secondary | ICD-10-CM | POA: Diagnosis not present

## 2021-04-29 DIAGNOSIS — Z1231 Encounter for screening mammogram for malignant neoplasm of breast: Secondary | ICD-10-CM | POA: Diagnosis not present

## 2021-04-29 DIAGNOSIS — M25552 Pain in left hip: Secondary | ICD-10-CM | POA: Diagnosis not present

## 2021-04-29 DIAGNOSIS — M6281 Muscle weakness (generalized): Secondary | ICD-10-CM | POA: Diagnosis not present

## 2021-04-30 ENCOUNTER — Other Ambulatory Visit: Payer: Self-pay

## 2021-04-30 ENCOUNTER — Encounter: Payer: Self-pay | Admitting: Cardiology

## 2021-04-30 MED ORDER — CLOPIDOGREL BISULFATE 75 MG PO TABS: 75.0000 mg | ORAL_TABLET | Freq: Every day | ORAL | 3 refills | Status: DC

## 2021-05-01 DIAGNOSIS — K769 Liver disease, unspecified: Secondary | ICD-10-CM | POA: Diagnosis not present

## 2021-05-01 DIAGNOSIS — I110 Hypertensive heart disease with heart failure: Secondary | ICD-10-CM | POA: Diagnosis not present

## 2021-05-01 DIAGNOSIS — I5020 Unspecified systolic (congestive) heart failure: Secondary | ICD-10-CM | POA: Diagnosis not present

## 2021-05-01 DIAGNOSIS — M810 Age-related osteoporosis without current pathological fracture: Secondary | ICD-10-CM | POA: Diagnosis not present

## 2021-05-03 DIAGNOSIS — R2689 Other abnormalities of gait and mobility: Secondary | ICD-10-CM | POA: Diagnosis not present

## 2021-05-03 DIAGNOSIS — M25552 Pain in left hip: Secondary | ICD-10-CM | POA: Diagnosis not present

## 2021-05-03 DIAGNOSIS — M6281 Muscle weakness (generalized): Secondary | ICD-10-CM | POA: Diagnosis not present

## 2021-05-06 DIAGNOSIS — M6281 Muscle weakness (generalized): Secondary | ICD-10-CM | POA: Diagnosis not present

## 2021-05-06 DIAGNOSIS — M25552 Pain in left hip: Secondary | ICD-10-CM | POA: Diagnosis not present

## 2021-05-06 DIAGNOSIS — R2689 Other abnormalities of gait and mobility: Secondary | ICD-10-CM | POA: Diagnosis not present

## 2021-05-13 DIAGNOSIS — R2689 Other abnormalities of gait and mobility: Secondary | ICD-10-CM | POA: Diagnosis not present

## 2021-05-13 DIAGNOSIS — M25552 Pain in left hip: Secondary | ICD-10-CM | POA: Diagnosis not present

## 2021-05-13 DIAGNOSIS — M6281 Muscle weakness (generalized): Secondary | ICD-10-CM | POA: Diagnosis not present

## 2021-05-17 DIAGNOSIS — R2689 Other abnormalities of gait and mobility: Secondary | ICD-10-CM | POA: Diagnosis not present

## 2021-05-17 DIAGNOSIS — M6281 Muscle weakness (generalized): Secondary | ICD-10-CM | POA: Diagnosis not present

## 2021-05-17 DIAGNOSIS — M25552 Pain in left hip: Secondary | ICD-10-CM | POA: Diagnosis not present

## 2021-05-20 DIAGNOSIS — R2689 Other abnormalities of gait and mobility: Secondary | ICD-10-CM | POA: Diagnosis not present

## 2021-05-20 DIAGNOSIS — M6281 Muscle weakness (generalized): Secondary | ICD-10-CM | POA: Diagnosis not present

## 2021-05-20 DIAGNOSIS — M25552 Pain in left hip: Secondary | ICD-10-CM | POA: Diagnosis not present

## 2021-05-23 ENCOUNTER — Encounter (HOSPITAL_COMMUNITY): Payer: Self-pay | Admitting: Emergency Medicine

## 2021-05-23 ENCOUNTER — Observation Stay (HOSPITAL_COMMUNITY)
Admission: EM | Admit: 2021-05-23 | Discharge: 2021-05-26 | Disposition: A | Discharge status: Home / Self Care | Payer: HMO | Attending: Internal Medicine | Admitting: Internal Medicine

## 2021-05-23 ENCOUNTER — Other Ambulatory Visit: Payer: Self-pay

## 2021-05-23 ENCOUNTER — Observation Stay (HOSPITAL_COMMUNITY): Payer: HMO

## 2021-05-23 ENCOUNTER — Emergency Department (HOSPITAL_COMMUNITY): Payer: HMO

## 2021-05-23 DIAGNOSIS — K219 Gastro-esophageal reflux disease without esophagitis: Secondary | ICD-10-CM | POA: Diagnosis present

## 2021-05-23 DIAGNOSIS — Z87891 Personal history of nicotine dependence: Secondary | ICD-10-CM | POA: Diagnosis not present

## 2021-05-23 DIAGNOSIS — R001 Bradycardia, unspecified: Secondary | ICD-10-CM | POA: Insufficient documentation

## 2021-05-23 DIAGNOSIS — I428 Other cardiomyopathies: Secondary | ICD-10-CM | POA: Diagnosis not present

## 2021-05-23 DIAGNOSIS — N178 Other acute kidney failure: Secondary | ICD-10-CM | POA: Diagnosis not present

## 2021-05-23 DIAGNOSIS — R0609 Other forms of dyspnea: Secondary | ICD-10-CM

## 2021-05-23 DIAGNOSIS — Z79899 Other long term (current) drug therapy: Secondary | ICD-10-CM | POA: Insufficient documentation

## 2021-05-23 DIAGNOSIS — R42 Dizziness and giddiness: Secondary | ICD-10-CM | POA: Diagnosis not present

## 2021-05-23 DIAGNOSIS — I2510 Atherosclerotic heart disease of native coronary artery without angina pectoris: Secondary | ICD-10-CM | POA: Diagnosis not present

## 2021-05-23 DIAGNOSIS — R55 Syncope and collapse: Secondary | ICD-10-CM | POA: Diagnosis not present

## 2021-05-23 DIAGNOSIS — N19 Unspecified kidney failure: Secondary | ICD-10-CM | POA: Diagnosis not present

## 2021-05-23 DIAGNOSIS — Z8542 Personal history of malignant neoplasm of other parts of uterus: Secondary | ICD-10-CM | POA: Insufficient documentation

## 2021-05-23 DIAGNOSIS — E7800 Pure hypercholesterolemia, unspecified: Secondary | ICD-10-CM | POA: Insufficient documentation

## 2021-05-23 DIAGNOSIS — J449 Chronic obstructive pulmonary disease, unspecified: Secondary | ICD-10-CM | POA: Diagnosis not present

## 2021-05-23 DIAGNOSIS — I5042 Chronic combined systolic (congestive) and diastolic (congestive) heart failure: Secondary | ICD-10-CM | POA: Insufficient documentation

## 2021-05-23 DIAGNOSIS — I110 Hypertensive heart disease with heart failure: Secondary | ICD-10-CM | POA: Diagnosis not present

## 2021-05-23 DIAGNOSIS — R29818 Other symptoms and signs involving the nervous system: Secondary | ICD-10-CM | POA: Diagnosis not present

## 2021-05-23 DIAGNOSIS — I10 Essential (primary) hypertension: Secondary | ICD-10-CM | POA: Diagnosis not present

## 2021-05-23 DIAGNOSIS — Z91040 Latex allergy status: Secondary | ICD-10-CM | POA: Insufficient documentation

## 2021-05-23 DIAGNOSIS — D638 Anemia in other chronic diseases classified elsewhere: Secondary | ICD-10-CM

## 2021-05-23 DIAGNOSIS — Z7901 Long term (current) use of anticoagulants: Secondary | ICD-10-CM | POA: Insufficient documentation

## 2021-05-23 DIAGNOSIS — G319 Degenerative disease of nervous system, unspecified: Secondary | ICD-10-CM | POA: Diagnosis not present

## 2021-05-23 DIAGNOSIS — I5022 Chronic systolic (congestive) heart failure: Secondary | ICD-10-CM

## 2021-05-23 DIAGNOSIS — Z7902 Long term (current) use of antithrombotics/antiplatelets: Secondary | ICD-10-CM | POA: Insufficient documentation

## 2021-05-23 DIAGNOSIS — J9811 Atelectasis: Secondary | ICD-10-CM | POA: Diagnosis not present

## 2021-05-23 DIAGNOSIS — Z85118 Personal history of other malignant neoplasm of bronchus and lung: Secondary | ICD-10-CM | POA: Diagnosis not present

## 2021-05-23 DIAGNOSIS — R29810 Facial weakness: Secondary | ICD-10-CM | POA: Diagnosis not present

## 2021-05-23 LAB — URINALYSIS, ROUTINE W REFLEX MICROSCOPIC
Bilirubin Urine: NEGATIVE
Glucose, UA: NEGATIVE mg/dL
Hgb urine dipstick: NEGATIVE
Ketones, ur: NEGATIVE mg/dL
Leukocytes,Ua: NEGATIVE
Nitrite: NEGATIVE
Protein, ur: NEGATIVE mg/dL
Specific Gravity, Urine: 1.006 (ref 1.005–1.030)
pH: 5 (ref 5.0–8.0)

## 2021-05-23 LAB — CBC
HCT: 32.6 % — ABNORMAL LOW (ref 36.0–46.0)
Hemoglobin: 10.6 g/dL — ABNORMAL LOW (ref 12.0–15.0)
MCH: 30.6 pg (ref 26.0–34.0)
MCHC: 32.5 g/dL (ref 30.0–36.0)
MCV: 94.2 fL (ref 80.0–100.0)
Platelets: 199 10*3/uL (ref 150–400)
RBC: 3.46 MIL/uL — ABNORMAL LOW (ref 3.87–5.11)
RDW: 13.6 % (ref 11.5–15.5)
WBC: 7.1 10*3/uL (ref 4.0–10.5)
nRBC: 0 % (ref 0.0–0.2)

## 2021-05-23 LAB — ETHANOL: Alcohol, Ethyl (B): <10 mg/dL (ref <10)

## 2021-05-23 LAB — TSH: TSH: 1.577 u[IU]/mL (ref 0.350–4.500)

## 2021-05-23 LAB — COMPREHENSIVE METABOLIC PANEL
ALT: 11 U/L (ref 0–44)
AST: 22 U/L (ref 15–41)
Albumin: 3.6 g/dL (ref 3.5–5.0)
Alkaline Phosphatase: 39 U/L (ref 38–126)
Anion gap: 9 (ref 5–15)
BUN: 20 mg/dL (ref 8–23)
CO2: 24 mmol/L (ref 22–32)
Calcium: 9.4 mg/dL (ref 8.9–10.3)
Chloride: 102 mmol/L (ref 98–111)
Creatinine, Ser: 0.88 mg/dL (ref 0.44–1.00)
GFR, Estimated: >60 mL/min (ref >60)
Glucose, Bld: 105 mg/dL — ABNORMAL HIGH (ref 70–99)
Potassium: 4.6 mmol/L (ref 3.5–5.1)
Sodium: 135 mmol/L (ref 135–145)
Total Bilirubin: 0.6 mg/dL (ref 0.3–1.2)
Total Protein: 6.7 g/dL (ref 6.5–8.1)

## 2021-05-23 LAB — TROPONIN I (HIGH SENSITIVITY): Troponin I (High Sensitivity): 8 ng/L (ref <18)

## 2021-05-23 LAB — MAGNESIUM: Magnesium: 2 mg/dL (ref 1.7–2.4)

## 2021-05-23 MED ORDER — ATORVASTATIN CALCIUM 40 MG PO TABS
40.0000 mg | ORAL_TABLET | Freq: Every day | ORAL | Status: DC
Start: 2021-05-24 — End: 2021-05-26
  Administered 2021-05-24 – 2021-05-26 (×3): 40 mg via ORAL
  Filled 2021-05-23 (×3): qty 1

## 2021-05-23 MED ORDER — ACETAMINOPHEN 325 MG PO TABS: 650.0000 mg | ORAL_TABLET | Freq: Four times a day (QID) | ORAL | Status: DC | PRN

## 2021-05-23 MED ORDER — FAMOTIDINE 20 MG PO TABS
40.0000 mg | ORAL_TABLET | Freq: Every evening | ORAL | Status: DC
  Administered 2021-05-23 – 2021-05-25 (×3): 40 mg via ORAL
  Filled 2021-05-23 (×3): qty 2

## 2021-05-23 MED ORDER — CHOLESTYRAMINE 4 G PO PACK
4.0000 g | PACK | Freq: Two times a day (BID) | ORAL | Status: DC
  Administered 2021-05-23 – 2021-05-26 (×6): 4 g via ORAL
  Filled 2021-05-23 (×7): qty 1

## 2021-05-23 MED ORDER — LORAZEPAM 2 MG/ML IJ SOLN
0.5000 mg | Freq: Once | INTRAMUSCULAR | Status: AC
  Administered 2021-05-23: 0.5 mg via INTRAVENOUS
  Filled 2021-05-23: qty 1

## 2021-05-23 MED ORDER — LACTATED RINGERS IV SOLN: INTRAVENOUS | Status: DC

## 2021-05-23 MED ORDER — SODIUM CHLORIDE 0.9 % IV BOLUS
250.0000 mL | Freq: Once | INTRAVENOUS | Status: AC
  Administered 2021-05-23: 250 mL via INTRAVENOUS

## 2021-05-23 MED ORDER — LEFLUNOMIDE 20 MG PO TABS
20.0000 mg | ORAL_TABLET | Freq: Every day | ORAL | Status: DC
  Administered 2021-05-24 – 2021-05-26 (×3): 20 mg via ORAL
  Filled 2021-05-23 (×3): qty 1

## 2021-05-23 MED ORDER — UMECLIDINIUM BROMIDE 62.5 MCG/ACT IN AEPB
1.0000 | INHALATION_SPRAY | Freq: Every day | RESPIRATORY_TRACT | Status: DC
  Administered 2021-05-24 – 2021-05-26 (×3): 1 via RESPIRATORY_TRACT
  Filled 2021-05-23: qty 7

## 2021-05-23 MED ORDER — FLUTICASONE FUROATE-VILANTEROL 100-25 MCG/ACT IN AEPB
1.0000 | INHALATION_SPRAY | Freq: Every day | RESPIRATORY_TRACT | Status: DC
  Administered 2021-05-24 – 2021-05-26 (×3): 1 via RESPIRATORY_TRACT
  Filled 2021-05-23: qty 28

## 2021-05-23 MED ORDER — CLOPIDOGREL BISULFATE 75 MG PO TABS
75.0000 mg | ORAL_TABLET | Freq: Every day | ORAL | Status: DC
  Administered 2021-05-24 – 2021-05-26 (×3): 75 mg via ORAL
  Filled 2021-05-23 (×3): qty 1

## 2021-05-23 MED ORDER — ALBUTEROL SULFATE (2.5 MG/3ML) 0.083% IN NEBU: 2.5000 mg | INHALATION_SOLUTION | RESPIRATORY_TRACT | Status: DC | PRN

## 2021-05-23 MED ORDER — ACETAMINOPHEN 650 MG RE SUPP: 650.0000 mg | Freq: Four times a day (QID) | RECTAL | Status: DC | PRN

## 2021-05-23 NOTE — ED Notes (Signed)
Patient transported to MRI 

## 2021-05-23 NOTE — ED Provider Notes (Addendum)
[facility] EMERGENCY DEPARTMENT Provider Note   CSN: [id number] Arrival date & time: 05/23/21  1346     History  Chief Complaint  Patient presents with   Dizziness    Taylor Berry is a 81 y.o. female.  HPI 81 year old female history of stroke on Christmas Day without significant residual deficits presents today stating that she has been dizzy since waking up this morning with last known normal last night.  He feels that her left eye may have been drooping more than usual.  She has had some baseline difficulty speaking.  She has not noted any other new lateralized deficits.  She denies headache or head injury.  She endorses some chest discomfort and dyspnea.  She states these are at baseline secondary to her COPD.     Home Medications Prior to Admission medications   Medication Sig Start Date End Date Taking? Authorizing Provider  albuterol (VENTOLIN HFA) 108 (90 Base) MCG/ACT inhaler INHALE 2 PUFFS BY MOUTH IN EACH NOSTRIL EVERY 4 TO 6 HOURS AS NEEDED Patient taking differently: Inhale 2 puffs into the lungs See admin instructions. Inhale 2 puffs by mouth in each nostril every 4 to 6 hours as needed 08/31/20  Yes Byrum, Rose Fillers, MD  atorvastatin (LIPITOR) 40 MG tablet Take 1 tablet (40 mg total) by mouth daily. 01/04/21  Yes Charlynne Cousins, MD  CALCIUM-VITAMIN D PO Take 1 tablet by mouth daily.   Yes [provider]  candesartan (ATACAND) 8 MG tablet TAKE 1 TABLET(8 MG) BY MOUTH DAILY 01/04/21  Yes Crenshaw, Denice Bors, MD  cholestyramine (QUESTRAN) 4 g packet Take 1 packet (4 g total) by mouth 2 (two) times daily. 03/27/21  Yes Thornton Park, MD  ciclopirox (PENLAC) 8 % solution Apply 1 application. topically at bedtime. 04/13/21  Yes [provider]  clopidogrel (PLAVIX) 75 MG tablet Take 1 tablet (75 mg total) by mouth daily. 04/30/21  Yes [name], MD  famotidine (PEPCID) 40 MG tablet Take 40 mg by mouth every evening.   Yes  [provider]  Fluticasone-Umeclidin-Vilant (TRELEGY ELLIPTA) 100-62.5-25 MCG/ACT AEPB Inhale 1 puff into the lungs daily. 12/11/20  Yes Kozlow, Donnamarie Poag, MD  furosemide (LASIX) 20 MG tablet Take 1 tablet (20 mg total) by mouth daily. 04/15/21  Yes [name], MD  leflunomide (ARAVA) 20 MG tablet Take 20 mg by mouth daily.   Yes [provider]  METAMUCIL FIBER PO Take by mouth See admin instructions. Mix 1 tablespoonful of powder into 4-8 ounces of desired beverage and drink once a day   Yes [provider]  metoprolol succinate (TOPROL-XL) 25 MG 24 hr tablet TAKE 1 TABLET(25 MG) BY MOUTH DAILY Patient taking differently: Take 25 mg by mouth in the morning. 10/10/20  Yes [name], MD  vitamin B-12 (CYANOCOBALAMIN) 1000 MCG tablet Take 1 tablet (1,000 mcg total) by mouth daily. Patient not taking: Reported on 05/23/2021 01/03/21   Charlynne Cousins, MD      Allergies    Latex, Ace inhibitors, Angiotensin receptor blockers, Fentanyl, Hydrochlorothiazide, Hydroxychloroquine, and Penicillins    Review of Systems   Review of Systems  Physical Exam Updated Vital Signs BP (!) 164/132   Pulse (!) 45   Temp 98.1 F (36.7 C) (Oral)   Resp 11   SpO2 97%  Physical Exam Vitals and nursing note reviewed.  Constitutional:      Appearance: Normal appearance.  HENT:     Head: Normocephalic  and atraumatic.     Right Ear: External ear normal.     Left Ear: External ear normal.     Nose: Nose normal.     Mouth/Throat:     Mouth: Mucous membranes are moist.     Pharynx: Oropharynx is clear.  Eyes:     Extraocular Movements: Extraocular movements intact.     Pupils: Pupils are equal, round, and reactive to light.  Cardiovascular:     Rate and Rhythm: Regular rhythm. Bradycardia present.     Pulses: Normal pulses.     Heart sounds: Normal heart sounds.  Pulmonary:     Effort: Pulmonary effort is normal.     Breath sounds: Normal breath sounds.   Abdominal:     General: Abdomen is flat. Bowel sounds are normal. There is no distension.     Palpations: Abdomen is soft.     Tenderness: There is no abdominal tenderness.  Musculoskeletal:        General: Normal range of motion.     Cervical back: Normal range of motion.  Skin:    General: Skin is warm.     Capillary Refill: Capillary refill takes less than 2 seconds.  Neurological:     General: No focal deficit present.     Mental Status: She is alert.     Cranial Nerves: No cranial nerve deficit.     Sensory: No sensory deficit.     Motor: No weakness.     Coordination: Coordination normal.     Deep Tendon Reflexes: Reflexes normal.     Comments: No visual field deficit Heel-to-shin and finger-to-nose normal bilaterally  Psychiatric:        Mood and Affect: Mood normal.        Behavior: Behavior normal.    ED Results / Procedures / Treatments   Labs (all labs ordered are listed, but only abnormal results are displayed) Labs Reviewed  CBC - Abnormal; Notable for the following components:      Result Value   RBC 3.46 (*)    Hemoglobin 10.6 (*)    HCT 32.6 (*)    All other components within normal limits  COMPREHENSIVE METABOLIC PANEL - Abnormal; Notable for the following components:   Glucose, Bld 105 (*)    All other components within normal limits  URINALYSIS, ROUTINE W REFLEX MICROSCOPIC    EKG EKG Interpretation  Date/Time:  Thursday May 23 2021 14:01:27 EDT Ventricular Rate:  51 PR Interval:  211 QRS Duration: 146 QT Interval:  508 QTC Calculation: 468 R Axis:   -45 Text Interpretation: Sinus or ectopic atrial rhythm Left bundle branch block Confirmed by Pattricia Boss [phone]) on 05/23/2021 4:10:01 PM  Radiology CT Head Wo Contrast  Result Date: 05/23/2021 CLINICAL DATA:  Acute neuro deficit.  Dizziness EXAM: CT HEAD WITHOUT CONTRAST TECHNIQUE: Contiguous axial images were obtained from the base of the skull through the vertex without intravenous  contrast. RADIATION DOSE REDUCTION: This exam was performed according to the departmental dose-optimization program which includes automated exposure control, adjustment of the mA and/or kV according to patient size and/or use of iterative reconstruction technique. COMPARISON:  CT head 12/30/2020 FINDINGS: Brain: Generalized atrophy. Moderate white matter hypodensity bilaterally appears chronic and unchanged Negative for acute infarct, hemorrhage, mass Vascular: Negative for hyperdense vessel Skull: Negative Sinuses/Orbits: Mild mucosal edema right maxillary sinus. Remaining sinuses clear. No orbital lesion. Other: None IMPRESSION: No acute abnormality. Atrophy and chronic microvascular ischemia in the white matter, stable from the prior  study. Electronically Signed   By: Franchot Gallo M.D.   On: 05/23/2021 15:13    Procedures .Critical Care Performed by: Pattricia Boss, MD Authorized by: Pattricia Boss, MD   Critical care provider statement:    Critical care time (minutes):  30   Critical care was time spent personally by me on the following activities:  Development of treatment plan with patient or surrogate, discussions with consultants, evaluation of patient's response to treatment, examination of patient, ordering and review of laboratory studies, ordering and review of radiographic studies, ordering and performing treatments and interventions, pulse oximetry, re-evaluation of patient's condition and review of old charts    Medications Ordered in ED Medications  LORazepam (ATIVAN) injection 0.5 mg (has no administration in time range)    ED Course/ Medical Decision Making/ A&P Clinical Course as of 05/23/21 1614  Thu May 23, 2021  1608 CBC is obtained with anemia and hemoglobin at 10.6.  This is unchanged from first prior [DR]  1608 Comprehensive metabolic panel(!) Complete metabolic panel is reviewed and interpreted and is within normal limits [DR]    Clinical Course User Index [DR]  Pattricia Boss, MD                           Medical Decision Making 81 year old female who presents from home with complaints of dizziness.  She also complained of some left eyelid drooping.  Patient with pons infarct acute to subacute in December.  At that time she was also hyponatremic. She has improved and done well outpatient.  Today she presents with concerns for new stroke with vertigo and some difficulty with speech and left eyelid drooping. Attempts to differentiate between lightheadedness and vertigo were unclear by history and cannot rule out lightheadedness but patient appears to have more symptoms consistent with vertigo Differential diagnosis includes but is not limited to acute intracranial abnormality such as bleeding, mass, stroke, metabolic abnormalities including hyponatremia and other metabolic derangements, anemia and other etiologies of hypotension such as bradycardia medication effects CT obtained here and no evidence of acute intracranial bleeding or mass MRI is pending Labs have been drawn and CBC and complete metabolic panel results documented in work-up  Patient is given 0.5 of Ativan for MRI Patient's heart rate 51 on EKG and has ranged from 40s to 50s here in ED, review of prior heart rates have been in the 60s.  Patient is on a beta-blocker and will need to be held Plan admission for ongoing heart rate observation Care signed out to Dr. Roderic Palau and he will review MRI and disposition appropriately  Amount and/or Complexity of Data Reviewed Labs: ordered. Decision-making details documented in ED Course. Radiology: ordered.  Risk Prescription drug management.           Final Clinical Impression(s) / ED Diagnoses Final diagnoses:  Dizziness  Bradycardia    Rx / DC Orders ED Discharge Orders     None         Pattricia Boss, MD 05/23/21 [place]    Pattricia Boss, MD 05/23/21 1642

## 2021-05-23 NOTE — Assessment & Plan Note (Signed)
 #)   Acute prerenal azotemia: Noted: Presenting labs, although mild interval elevation in creatinine does not meet quantitative cutoff for acute kidney injury.  Clinical suspicion for mild dehydration, offering potential contribution to her presenting postural presyncope, although formal orthostatic vital sign evaluation results are currently pending.  Presenting urinalysis without presence of urinary casts.  Thus far, she has received a 250 cc normal saline bolus.  Will provide additional gentle IV fluids, while closely monitoring for evidence of ensuing volume overload in the context of her history of chronic systolic/diastolic heart failure.  Plan: Lactated Ringer's at 50 cc/h x 10 hours per monitor strict I's and O's Daily weights.  Repeat CMP in the morning.  Hold home Lasix for now.

## 2021-05-23 NOTE — Assessment & Plan Note (Signed)
 #(  GERD: Documented history of such, on daily Pepcid as outpatient.  Plan: Continue outpatient H2 blocker.

## 2021-05-23 NOTE — Assessment & Plan Note (Signed)
 #)   COPD: Documented history of such, in the context of being a former smoker.  Outpatient respiratory regimen consists of Trelegy as well as prn albuterol inhaler.  No clinical evidence to suggest acute exacerbation at this time.  Plan: Continue home respiratory regimen.

## 2021-05-23 NOTE — Assessment & Plan Note (Signed)
 #)  Sinus bradycardia with first-degree AV block: Heart rates in the ED today noted to be in the mid 40s to the mid 50s, without any associated hypotension, with EKG showing sinus bradycardia with first-degree AV block, heart rate 51, but no evidence of T wave or ST changes relative to most recent prior EKG from December 2022, which showed sinus rhythm with heart rate of 62 in the absence of associated first AV block.  Of note, patient on metoprolol succinate as outpatient, but denies any recent dose modifications thereof following reduction in the dose of her home beta-blocker in September 2021 after Holter monitor revealed evidence of sinus bradycardia. baseline heart rate not entirely clear to me at this time.  It is possible that her bradycardia is slightly worsened relative to prior, and there is evidence of new first-degree AV block.  We will hold home beta-blocker for now and allow for washout period, assessing for ensuing heart rate as well as implications towards her presenting postural dizziness to help guide timing of resumption of beta-blocker, with potential reduction in dose.    As described above, ACS felt to be less likely, although we will trend troponin, and further evaluate via echocardiogram.  No evidence of hyperkalemia.  We will check ionized calcium level given borderline elevation of serum calcium on CMP after adjustment for mild hypoalbuminemia.  Additional potential contributor factors include documentation of a history of obstructive sleep apnea for which the patient is not on nocturnal CPAP.   Plan: Monitor on telemetry.  Add on serum magnesium level as well as ionized calcium level.  Check TSH, trend troponin.  Echo in the morning.  Check urinary drug screen, serum ethanol level, chest x-ray.  Fall precautions ordered.  Further evaluation management of presenting presyncope, as above.

## 2021-05-23 NOTE — Assessment & Plan Note (Signed)
  #)   Essential Hypertension: documented h/o such, with outpatient antihypertensive regimen including metoprolol succinate, candesartan, Lasix.  SBP's in the ED today: 130s 150s mmHg.  However, in the setting of presenting presyncope, concern for orthostatic hypotension, as well as evidence of bradycardia with history of a block, will hold him beta-blocker as well as additional and hypertensive medications for now.  Plan: Close monitoring of subsequent BP via routine VS. hold home candesartan, Lasix, beta-blocker for now.  Monitor on telemetry.  Further evaluation of presyncope as well as bradycardia, as above.

## 2021-05-23 NOTE — ED Notes (Signed)
PT AMBULATED TO BATHROOM WITH HELP PT STATES THEY USE A CANE AT HOME

## 2021-05-23 NOTE — H&P (Addendum)
History and Physical    PLEASE NOTE THAT DRAGON DICTATION SOFTWARE WAS USED IN THE CONSTRUCTION OF THIS NOTE.   Taylor Berry WYO:[id number] DOB: 08-20-1940 DOA: 05/23/2021  PCP: Taylor Pillar, MD  Patient coming from: home   I have personally briefly reviewed patient's old medical records in [place]  Chief Complaint: Dizziness  HPI: Taylor Berry is a 81 y.o. female with medical history significant for nonobstructive CAD, COPD, chronic anemia with baseline hemoglobin 10-12, hypertension, hyperlipidemia, peripheral polyneuropathy rheumatoid arthritis, chronic systolic/diastolic heart failure, who is admitted to [facility] on 05/23/2021 with dizziness after presenting from home to [facility] ED complaining of such.   The patient reports dizziness when rising from a seated to standing position, which she first noticed earlier this morning, shortly after taking her home metoprolol succinate 25 mg p.o. every morning around 11-1128 AM.  She describes the dizziness as " lightheadedness" but denies any associated sensation of room spinning.  Denies overt syncope, and reports that her dizziness has not resulted in any falls.  Denies any associated chest pain, shortness of breath, palpitations, diaphoresis, nausea, vomiting.  No recent worsening of peripheral edema, nor any recent calf tenderness, or new lower extremity erythema.  No hemoptysis.  Not associate with any tonic-clonic activity, nor any tongue biting or loss of bowel/bladder function.  She conveys a history of idiopathic peripheral polyneuropathy, but denies any associated acute focal numbness or paresthesias.  She also denies any acute focal weakness, facial droop, acute change in vision, dysphagia, or headache.  No recent diarrhea, melena, hematochezia, abdominal pain, or rash.  She experienced an acute ischemic CVA in December 2022, without any reported residual focal neurologic deficits.  Denies any recent subjective  fever, chills, rigors, or generalized myalgias.  No recent dysuria or gross hematuria.  Per review of most recent outpatient progress note from her cardiologist, Dr. Stanford Berry, On 03/04/2021, her most recent left-sided cardiac catheterization occurred in October 2018 demonstrating mild nonobstructive CAD.  She underwent Holter monitor in September 2021 which showed sinus bradycardia, occasional PACs, 4 and 6 beat runs of paroxysmal atrial tachycardia, occasional PVCs.  In light of the bradycardia associated with this Holter monitor results, her dose of metoprolol succinate was decreased shortly after the September 2021 results.    She is currently taking metoprolol succinate 25 mg p.o. every morning, and denies any recent further dose modifications.  Denies any additional AV nodal blocking agents.  Outpatient hypertensive regimen is also notable for candesartan as well as Lasix 20 mg p.o. daily.  She has a history of chronic systolic/diastolic heart failure, with most recent echocardiogram on 12/25/2020 notable for LVEF 45 to 50%, moderate LVH, grade 1 diastolic dysfunction, normal right ventricular systolic function, and trivial mitral regurgitation.  Per chart review, most recent EKG from December 2022 was notable for sinus rhythm with heart rate of 62.     ED Course:  Vital signs in the ED were notable for the following: Afebrile; heart rate 43-56; blood pressure 131/77 - 150/75; respiratory rate 15-20, oxygen saturation 97 to 100% on room air.   Labs were notable for the following: CMP notable for the following: Sodium 135, potassium 4.6, BUN 20, creatinine 0.88 compared to most recent prior serum creatinine data point is 0.73 on 02/13/2021, BUN to creatinine ratio greater than 20, glucose 105, calcium, adjusted for mild hypoalbuminemia noted to be 9.8, albumin 3.6, otherwise, liver enzymes within normal limits.  CBC notable for white cell count 7100, hemoglobin  10.6 associated  normocytic/normochromic findings as well as nonelevated RDW, which is relative to most recent prior hemoglobin data point of 10.7 on 02/13/2021, platelet count 199.  Urinalysis showed no evidence of white blood cells.  Imaging and additional notable ED work-up: Presenting EKG, in comparison to most recent prior from 12/30/2020 shows sinus bradycardia with evidence of first-degree AV block, heart rate 51, PR interval 211, left bundle branch block, which is unchanged from most recent prior EKG, T wave inversion in aVL, which is also unchanged from most recent prior EKG, no evidence of ST changes, Q waves in V1/V2, which Q wave in V1 was seen on most recent prior EKG.  Noncontrast CT head showed no evidence of acute intracranial process.  MRI brain showed no evidence of acute intracranial process, including no evidence of acute infarct.   While in the ED, the following were administered: Ativan 0.5 mg IV x1 dose leading up to MRI brain; normal saline x250 cc bolus.  Subsequently, the patient was admitted for overnight observation for further evaluation management of her presenting dizziness, with presentation also notable for sinus tachycardia with first-degree AV block, in addition to acute prerenal azotemia.    Review of Systems: As per HPI otherwise 10 point review of systems negative.   Past Medical History:  Diagnosis Date   Abnormal CT scan    Allergic rhinitis    Anemia 2015   after lung surgery   Angio-edema    Complication of anesthesia    Fentanyl allergy, "claustrophobia"   COPD (chronic obstructive pulmonary disease) ([place])    Depression    Diaphoresis 07/30/2015   Excessive sweating with little exertion   Difficult intravenous access    GERD (gastroesophageal reflux disease)    In the past   Hyperlipidemia    Hypertension    Left bundle branch block    Lung cancer ([place]) 2015   left upper lobe wedge removed-no further tx.   Neuropathy    Tingling in arms, fingers, and feet  (Since 06/15/16)   Obesity    OSA (obstructive sleep apnea)    denies.   Osteoarthritis    ra also   Osteoporosis    Recurrent upper respiratory infection (URI)    Rheumatoid arthritis(714.0)    Dr. Amil Amen   SOB (shortness of breath) on exertion    Uterine cancer ([place]) 1971   tx surgical    Past Surgical History:  Procedure Laterality Date   ABDOMINAL HYSTERECTOMY     in situ carcinoma partial   CARDIAC CATHETERIZATION  01/27/11   minor non-obs CAD, NL EF, mild pulm HTN   CATARACT EXTRACTION, BILATERAL     last done 12'16-   COLONOSCOPY W/ BIOPSIES AND POLYPECTOMY     COLONOSCOPY WITH PROPOFOL N/A 02/13/2015   Procedure: COLONOSCOPY WITH PROPOFOL;  Surgeon: Laurence Spates, MD;  Location: [facility];  Service: Endoscopy;  Laterality: N/A;   ESOPHAGOGASTRODUODENOSCOPY (EGD) WITH PROPOFOL N/A 09/30/2016   Procedure: ESOPHAGOGASTRODUODENOSCOPY (EGD) WITH PROPOFOL;  Surgeon: Laurence Spates, MD;  Location: [facility];  Service: Endoscopy;  Laterality: N/A;   LYMPH NODE DISSECTION Left 04/12/2013   Procedure: LYMPH NODE DISSECTION;  Surgeon: [name], MD;  Location: [place];  Service: Thoracic;  Laterality: Left;   RIGHT/LEFT HEART CATH AND CORONARY ANGIOGRAPHY N/A 10/08/2016   Procedure: RIGHT/LEFT HEART CATH AND CORONARY ANGIOGRAPHY;  Surgeon: Burnell Blanks, MD;  Location: [facility] [facility];  Service: Cardiovascular;  Laterality: N/A;   TONSILLECTOMY  VESICOVAGINAL FISTULA CLOSURE W/ TAH  1971   VIDEO ASSISTED THORACOSCOPY (VATS)/THOROCOTOMY Left 04/12/2013   Procedure: VIDEO ASSISTED THORACOSCOPY (VATS)/THOROCOTOMY, WITH LEFT UPPER LOBE WEDGE RESECTION, CHEST WALL BIOPSY ;  Surgeon: [name], MD;  Location: [place];  Service: Thoracic;  Laterality: Left;   VIDEO BRONCHOSCOPY N/A 04/12/2013   Procedure: VIDEO BRONCHOSCOPY;  Surgeon: [name], MD;  Location: [place];  Service: Thoracic;  Laterality: N/A;    Social History:  reports that she quit smoking  about 33 years ago. Her smoking use included cigarettes. She has a 35.00 pack-year smoking history. She has never used smokeless tobacco. She reports that she does not drink alcohol and does not use drugs.   Allergies  Allergen Reactions   Latex Itching and Rash   Ace Inhibitors Cough   Angiotensin Receptor Blockers Swelling and Other (See Comments)    Tongue swelling   Fentanyl Other (See Comments)    Hallucinations "I go crazy"      Hydrochlorothiazide Other (See Comments)    Decreased Sodium   Hydroxychloroquine Other (See Comments)    "Decreased sodium"   Penicillins Other (See Comments)    Yeast infections Has patient had a PCN reaction causing immediate rash, facial/tongue/throat swelling, SOB or lightheadedness with hypotension:No Has patient had a PCN reaction causing severe rash involving mucus membranes or skin necrosis: No Has patient had a PCN reaction that required hospitalization No Has patient had a PCN reaction occurring within the last 10 years: Yes If all of the above answers are "NO", then may proceed with Cephalosporin use.  Other reaction(s): yeast infection    Family History  Problem Relation Age of Onset   Lung cancer Mother    Coronary artery disease Son    Heart attack Son 60   Arthritis/Rheumatoid Maternal Grandmother    Coronary artery disease Son    Arthritis/Rheumatoid Son     Family history reviewed and not pertinent    Prior to Admission medications   Medication Sig Start Date End Date Taking? Authorizing Provider  albuterol (VENTOLIN HFA) 108 (90 Base) MCG/ACT inhaler INHALE 2 PUFFS BY MOUTH IN EACH NOSTRIL EVERY 4 TO 6 HOURS AS NEEDED Patient taking differently: Inhale 2 puffs into the lungs See admin instructions. Inhale 2 puffs by mouth in each nostril every 4 to 6 hours as needed 08/31/20  Yes Byrum, Rose Fillers, MD  atorvastatin (LIPITOR) 40 MG tablet Take 1 tablet (40 mg total) by mouth daily. 01/04/21  Yes Charlynne Cousins, MD   CALCIUM-VITAMIN D PO Take 1 tablet by mouth daily.   Yes [provider]  candesartan (ATACAND) 8 MG tablet TAKE 1 TABLET(8 MG) BY MOUTH DAILY 01/04/21  Yes Crenshaw, Denice Bors, MD  cholestyramine (QUESTRAN) 4 g packet Take 1 packet (4 g total) by mouth 2 (two) times daily. 03/27/21  Yes Thornton Park, MD  ciclopirox (PENLAC) 8 % solution Apply 1 application. topically at bedtime. 04/13/21  Yes [provider]  clopidogrel (PLAVIX) 75 MG tablet Take 1 tablet (75 mg total) by mouth daily. 04/30/21  Yes [name], MD  famotidine (PEPCID) 40 MG tablet Take 40 mg by mouth every evening.   Yes [provider]  Fluticasone-Umeclidin-Vilant (TRELEGY ELLIPTA) 100-62.5-25 MCG/ACT AEPB Inhale 1 puff into the lungs daily. 12/11/20  Yes Kozlow, Donnamarie Poag, MD  furosemide (LASIX) 20 MG tablet Take 1 tablet (20 mg total) by mouth daily. 04/15/21  Yes [name], MD  leflunomide (ARAVA) 20 MG  tablet Take 20 mg by mouth daily.   Yes [provider]  METAMUCIL FIBER PO Take by mouth See admin instructions. Mix 1 tablespoonful of powder into 4-8 ounces of desired beverage and drink once a day   Yes [provider]  metoprolol succinate (TOPROL-XL) 25 MG 24 hr tablet TAKE 1 TABLET(25 MG) BY MOUTH DAILY Patient taking differently: Take 25 mg by mouth in the morning. 10/10/20  Yes [name], MD  vitamin B-12 (CYANOCOBALAMIN) 1000 MCG tablet Take 1 tablet (1,000 mcg total) by mouth daily. Patient not taking: Reported on 05/23/2021 01/03/21   Charlynne Cousins, MD     Objective    Physical Exam: Vitals:   05/23/21 1915 05/23/21 1945 05/23/21 2015 05/23/21 2100  BP: 134/78 131/77 140/70 (!) 149/74  Pulse: (!) 51 60 (!) 51 (!) 53  Resp: 16 16 (!) 23 17  Temp:    97.6 F (36.4 C)  TempSrc:    Oral  SpO2: 100% 97%  100%    General: appears to be stated age; alert, oriented Skin: warm, dry, no rash Head:  AT/[place] Mouth:  Oral mucosa membranes appear  dry, normal dentition Neck: supple; trachea midline Heart:  bradycardia, regular; did not appreciate any M/R/G Lungs: CTAB, did not appreciate any wheezes, rales, or rhonchi Abdomen: + BS; soft, ND, NT Vascular: 2+ pedal pulses b/l; 2+ radial pulses b/l Extremities: no peripheral edema, no muscle wasting Neuro: strength and sensation intact in upper and lower extremities b/l   Labs on Admission: I have personally reviewed following labs and imaging studies  CBC: Recent Labs  Lab 05/23/21 1400  WBC 7.1  HGB 10.6*  HCT 32.6*  MCV 94.2  PLT 009   Basic Metabolic Panel: Recent Labs  Lab 05/23/21 1400  NA 135  K 4.6  CL 102  CO2 24  GLUCOSE 105*  BUN 20  CREATININE 0.88  CALCIUM 9.4   GFR: CrCl cannot be calculated (Unknown ideal weight.). Liver Function Tests: Recent Labs  Lab 05/23/21 1400  AST 22  ALT 11  ALKPHOS 39  BILITOT 0.6  PROT 6.7  ALBUMIN 3.6   No results for input(s): LIPASE, AMYLASE in the last 168 hours. No results for input(s): AMMONIA in the last 168 hours. Coagulation Profile: No results for input(s): INR, PROTIME in the last 168 hours. Cardiac Enzymes: No results for input(s): CKTOTAL, CKMB, CKMBINDEX, TROPONINI in the last 168 hours. BNP (last 3 results) No results for input(s): PROBNP in the last 8760 hours. HbA1C: No results for input(s): HGBA1C in the last 72 hours. CBG: No results for input(s): GLUCAP in the last 168 hours. Lipid Profile: No results for input(s): CHOL, HDL, LDLCALC, TRIG, CHOLHDL, LDLDIRECT in the last 72 hours. Thyroid Function Tests: No results for input(s): TSH, T4TOTAL, FREET4, T3FREE, THYROIDAB in the last 72 hours. Anemia Panel: No results for input(s): VITAMINB12, FOLATE, FERRITIN, TIBC, IRON, RETICCTPCT in the last 72 hours. Urine analysis:    Component Value Date/Time   COLORURINE STRAW (A) 05/23/2021 1535   APPEARANCEUR CLEAR 05/23/2021 1535   LABSPEC 1.006 05/23/2021 1535   PHURINE 5.0 05/23/2021  1535   GLUCOSEU NEGATIVE 05/23/2021 1535   HGBUR NEGATIVE 05/23/2021 1535   BILIRUBINUR NEGATIVE 05/23/2021 1535   KETONESUR NEGATIVE 05/23/2021 1535   PROTEINUR NEGATIVE 05/23/2021 1535   UROBILINOGEN 0.2 04/11/2013 1459   NITRITE NEGATIVE 05/23/2021 1535   LEUKOCYTESUR NEGATIVE 05/23/2021 1535    Radiological Exams on Admission: CT Head Wo Contrast  Result  Date: 05/23/2021 CLINICAL DATA:  Acute neuro deficit.  Dizziness EXAM: CT HEAD WITHOUT CONTRAST TECHNIQUE: Contiguous axial images were obtained from the base of the skull through the vertex without intravenous contrast. RADIATION DOSE REDUCTION: This exam was performed according to the departmental dose-optimization program which includes automated exposure control, adjustment of the mA and/or kV according to patient size and/or use of iterative reconstruction technique. COMPARISON:  CT head 12/30/2020 FINDINGS: Brain: Generalized atrophy. Moderate white matter hypodensity bilaterally appears chronic and unchanged Negative for acute infarct, hemorrhage, mass Vascular: Negative for hyperdense vessel Skull: Negative Sinuses/Orbits: Mild mucosal edema right maxillary sinus. Remaining sinuses clear. No orbital lesion. Other: None IMPRESSION: No acute abnormality. Atrophy and chronic microvascular ischemia in the white matter, stable from the prior study. Electronically Signed   By: Franchot Gallo M.D.   On: 05/23/2021 15:13   MR BRAIN WO CONTRAST  Result Date: 05/23/2021 CLINICAL DATA:  Acute neuro deficit.  Facial droop EXAM: MRI HEAD WITHOUT CONTRAST TECHNIQUE: Multiplanar, multiecho pulse sequences of the brain and surrounding structures were obtained without intravenous contrast. COMPARISON:  CT head 05/23/2021 FINDINGS: Brain: Negative for acute infarct. Generalized atrophy. Chronic microvascular ischemic change in the white matter and pons. Negative for hydrocephalus. Negative for hemorrhage or mass Vascular: Normal arterial flow voids Skull  and upper cervical spine: Negative Sinuses/Orbits: Paranasal sinuses clear. Left mastoid effusion. Bilateral cataract extraction Other: None IMPRESSION: Negative for acute infarct. Moderate chronic microvascular ischemic change in the white matter and pons. Generalized atrophy. Electronically Signed   By: Franchot Gallo M.D.   On: 05/23/2021 17:55     EKG: Independently reviewed, with result as described above.    Assessment/Plan   Principal Problem:   Postural dizziness with presyncope Active Problems:   Pure hypercholesterolemia   Chronic disease anemia   Essential (primary) hypertension   COPD (chronic obstructive pulmonary disease) (HCC)   Non-ischemic cardiomyopathy/diastolic and systolic CHF   GERD (gastroesophageal reflux disease)   Bradycardia   Acute prerenal azotemia      #) Presyncope: Dizziness/lightheadedness when rising from a seated to standing position, starting earlier today, not associated with any overt syncope or fall, suggestive of potential orthostatic hypotension in the setting of clinical evidence of dehydration given physical exam evidence of such as well as laboratory findings of acute prerenal azotemia, with exacerbation from diminished compensatory tachycardic response in the setting of presenting sinus bradycardia with first-degree AV block.  Of the patient's as bradycardia with first-degree block appears to be contributing to her presyncope, it is currently unclear if this is more on the basis of diminished compensatory effects versus representing a primary contributing factor.  We will check orthostatic vital signs, subsequent provide additional gentle IV fluids, and reevaluate the patient's symptoms to evaluate for interval improvement with intravascular repletion, while also holding home beta-blocker.  Distal potential pharmacologic factors include diminished compensatory vasoconstrictive response in the setting of home candesartan.  While less likely,  differential also includes potential for autonomic contribution given the patient's documented history of peripheral polyneuropathy.  Other potential pharmacologic factors include side effect of her leflunomide, which can cause dizziness.  CT head as well as MRI brain showed no evidence of acute process, including no evidence of acute infarct.  Clinically, no evidence to suggest contributory seizures.  ACS also appears less likely at this time, including in the absence of any associated chest pain, will EKG shows no overt evidence of acute ischemic changes.  However, in the setting of presenting bradycardia, will  trend serial troponin, and closely monitor on telemetry.      Plan: I have placed a nursing communication order requesting that orthostatic vital signs x 1 set be checked and documented, following which will initiate gentle IVF's in the form of lactated Ringer's at 50 cc/h x 10 hours. Monitor on telemetry. Hold home BB and other outpatient antihypertensive medications for now including home candesartan as well as Lasix.   Monitor strict I's and O's.  Add-on serum Mg level. Check CMP, CBC, serum Mg level in the AM. Fall precautions ordered. Trend trop.  Echocardiogram ordered for the morning.  Every 4 hours neurochecks ordered.  Further evaluation management of presenting is sinus bradycardia with first-degree AV block.  Chest x-ray.        #)Sinus bradycardia with first-degree AV block: Heart rates in the ED today noted to be in the mid 40s to the mid 50s, without any associated hypotension, with EKG showing sinus bradycardia with first-degree AV block, heart rate 51, but no evidence of T wave or ST changes relative to most recent prior EKG from December 2022, which showed sinus rhythm with heart rate of 62 in the absence of associated first AV block.  Of note, patient on metoprolol succinate as outpatient, but denies any recent dose modifications thereof following reduction in the dose of  her home beta-blocker in September 2021 after Holter monitor revealed evidence of sinus bradycardia. baseline heart rate not entirely clear to me at this time.  It is possible that her bradycardia is slightly worsened relative to prior, and there is evidence of new first-degree AV block.  We will hold home beta-blocker for now and allow for washout period, assessing for ensuing heart rate as well as implications towards her presenting postural dizziness to help guide timing of resumption of beta-blocker, with potential reduction in dose.    As described above, ACS felt to be less likely, although we will trend troponin, and further evaluate via echocardiogram.  No evidence of hyperkalemia.  We will check ionized calcium level given borderline elevation of serum calcium on CMP after adjustment for mild hypoalbuminemia.  Additional potential contributor factors include documentation of a history of obstructive sleep apnea for which the patient is not on nocturnal CPAP.   Plan: Monitor on telemetry.  Add on serum magnesium level as well as ionized calcium level.  Check TSH, trend troponin.  Echo in the morning.  Check urinary drug screen, serum ethanol level, chest x-ray.  Fall precautions ordered.  Further evaluation management of presenting presyncope, as above.       #) Acute prerenal azotemia: Noted: Presenting labs, although mild interval elevation in creatinine does not meet quantitative cutoff for acute kidney injury.  Clinical suspicion for mild dehydration, offering potential contribution to her presenting postural presyncope, although formal orthostatic vital sign evaluation results are currently pending.  Presenting urinalysis without presence of urinary casts.  Thus far, she has received a 250 cc normal saline bolus.  Will provide additional gentle IV fluids, while closely monitoring for evidence of ensuing volume overload in the context of her history of chronic systolic/diastolic heart  failure.  Plan: Lactated Ringer's at 50 cc/h x 10 hours per monitor strict I's and O's Daily weights.  Repeat CMP in the morning.  Hold home Lasix for now.        #) COPD: Documented history of such, in the context of being a former smoker.  Outpatient respiratory regimen consists of Trelegy as well as prn albuterol  inhaler.  No clinical evidence to suggest acute exacerbation at this time.  Plan: Continue home respiratory regimen.         #(chronic anemia: Documented history of such, associated baseline hemoglobin of 10-12, presenting hemoglobin consistent with this range, in the absence of any evidence of active bleed.  Suspect contribution from anemia of chronic disease given normocytic/normochromic findings.  Plan: Repeat CBC in the morning.       #(GERD: Documented history of such, on daily Pepcid as outpatient.  Plan: Continue outpatient H2 blocker.          #) Essential Hypertension: documented h/o such, with outpatient antihypertensive regimen including metoprolol succinate, candesartan, Lasix.  SBP's in the ED today: 130s 150s mmHg.  However, in the setting of presenting presyncope, concern for orthostatic hypotension, as well as evidence of bradycardia with history of a block, will hold him beta-blocker as well as additional and hypertensive medications for now.  Plan: Close monitoring of subsequent BP via routine VS. hold home candesartan, Lasix, beta-blocker for now.  Monitor on telemetry.  Further evaluation of presyncope as well as bradycardia, as above.           #) Hyperlipidemia: documented h/o such. On high intensity atorvastatin as outpatient.    Plan: continue home statin.          #) Rheumatoid arthritis: History of such, on leflunomide as outpatient, which is notable given its potential side effect of dizziness in the setting of her presenting complaint of this.  Overall felt to be less likely to be contributory, as the patient  conveys that she has been on this medication long-term, without any recent dose modifications there of and no clinical suspicion for interval change to associate metabolism thereof.   Plan: Continue home leflunomide .           #) Chronic combined systolic and diastolic heart failure: documented history of such, with most recent echocardiogram performed in December 2022 notable for LVEF 45 to 50% as well as grade 1 diastolic dysfunction, with additional results as conveyed above.  No clinical evidence to suggest acutely decompensated heart failure at this time.  Patient's home diuretic regimen reportedly consists of the following: Lasix 20 mg p.o. daily.  Rather, the patient demonstrates clinical evidence to suggest mild dehydration, as further detailed above.  Addendum with evidence of presyncope felt to be consistent with orthostatic hypotension, will hold home Lasix for now, provide gentle IV fluids, as outlined above, while closely monitoring for interval evidence to suggest development of acute volume overload.  Home cardiac medications also include the following: Candesartan and metoprolol succinate, as above. Etiology: Felt to be nonischemic cardiomyopathy given results of most recent cardiac cath in October 18 which showed mild nonobstructive CAD, as further detailed above.  Plan: monitor strict I's & O's and daily weights. Repeat BMP in the morning. Check serum magnesium level.  Holding home Lasix, ARB, beta-blocker for now, as above.  Follow-up result of chest x-ray, which has been ordered as component of evaluation for presenting presyncope as well as bradycardia, as above.      DVT prophylaxis: SCD's   Code Status: Full code Family Communication: none Disposition Plan: Per Rounding Team Consults called: none;  Admission status: obs  PLEASE NOTE THAT DRAGON DICTATION SOFTWARE WAS USED IN THE CONSTRUCTION OF THIS NOTE.   Armstrong DO [facility]  From [place]   05/23/2021, 9:38 PM

## 2021-05-23 NOTE — Assessment & Plan Note (Signed)
 #)   Presyncope: Dizziness/lightheadedness when rising from a seated to standing position, starting earlier today, not associated with any overt syncope or fall, suggestive of potential orthostatic hypotension in the setting of clinical evidence of dehydration given physical exam evidence of such as well as laboratory findings of acute prerenal azotemia, with exacerbation from diminished compensatory tachycardic response in the setting of presenting sinus bradycardia with first-degree AV block.  Of the patient's as bradycardia with first-degree block appears to be contributing to her presyncope, it is currently unclear if this is more on the basis of diminished compensatory effects versus representing a primary contributing factor.  We will check orthostatic vital signs, subsequent provide additional gentle IV fluids, and reevaluate the patient's symptoms to evaluate for interval improvement with intravascular repletion, while also holding home beta-blocker.  Distal potential pharmacologic factors include diminished compensatory vasoconstrictive response in the setting of home candesartan.  While less likely, differential also includes potential for autonomic contribution given the patient's documented history of peripheral polyneuropathy.  Other potential pharmacologic factors include side effect of her leflunomide, which can cause dizziness.  CT head as well as MRI brain showed no evidence of acute process, including no evidence of acute infarct.  Clinically, no evidence to suggest contributory seizures.  ACS also appears less likely at this time, including in the absence of any associated chest pain, will EKG shows no overt evidence of acute ischemic changes.  However, in the setting of presenting bradycardia, will trend serial troponin, and closely monitor on telemetry.      Plan: I have placed a nursing communication order requesting that orthostatic vital signs x 1 set be checked and documented,  following which will initiate gentle IVF's in the form of lactated Ringer's at 50 cc/h x 10 hours. Monitor on telemetry. Hold home BB and other outpatient antihypertensive medications for now including home candesartan as well as Lasix.   Monitor strict I's and O's.  Add-on serum Mg level. Check CMP, CBC, serum Mg level in the AM. Fall precautions ordered. Trend trop.  Echocardiogram ordered for the morning.  Every 4 hours neurochecks ordered.  Further evaluation management of presenting is sinus bradycardia with first-degree AV block.  Chest x-ray.

## 2021-05-23 NOTE — Assessment & Plan Note (Signed)
  #)   Chronic combined systolic and diastolic heart failure: documented history of such, with most recent echocardiogram performed in December 2022 notable for LVEF 45 to 50% as well as grade 1 diastolic dysfunction, with additional results as conveyed above.  No clinical evidence to suggest acutely decompensated heart failure at this time.  Patient's home diuretic regimen reportedly consists of the following: Lasix 20 mg p.o. daily.  Rather, the patient demonstrates clinical evidence to suggest mild dehydration, as further detailed above.  Addendum with evidence of presyncope felt to be consistent with orthostatic hypotension, will hold home Lasix for now, provide gentle IV fluids, as outlined above, while closely monitoring for interval evidence to suggest development of acute volume overload.  Home cardiac medications also include the following: Candesartan and metoprolol succinate, as above. Etiology: Felt to be nonischemic cardiomyopathy given results of most recent cardiac cath in October 18 which showed mild nonobstructive CAD, as further detailed above.  Plan: monitor strict I's & O's and daily weights. Repeat BMP in the morning. Check serum magnesium level.  Holding home Lasix, ARB, beta-blocker for now, as above.  Follow-up result of chest x-ray, which has been ordered as component of evaluation for presenting presyncope as well as bradycardia, as above.

## 2021-05-23 NOTE — Assessment & Plan Note (Signed)
   #)   Hyperlipidemia: documented h/o such. On high intensity atorvastatin as outpatient.    Plan: continue home statin.

## 2021-05-23 NOTE — ED Triage Notes (Signed)
Pt BIB [facility] from home, c/o dizziness since waking up this AM. Pt went to bed at 2330 last PM and was completely normal. Hx stroke 12/30/20 with deficit of balance issues. Minor facial droop noted, grips equal, no drift noted to arms/legs, sensation intact bilaterally. Pt reports feeling as if her speech is slower than usual, speech not slurred at this time. Pt takes plavix, lasix. Hx afib and CHF. EMS reported HR between 40-60s with no change in sx when HR was in 40s. BP 128/80, CBG 113. A/O x4.

## 2021-05-23 NOTE — Assessment & Plan Note (Signed)
 #(  chronic anemia: Documented history of such, associated baseline hemoglobin of 10-12, presenting hemoglobin consistent with this range, in the absence of any evidence of active bleed.  Suspect contribution from anemia of chronic disease given normocytic/normochromic findings.  Plan: Repeat CBC in the morning.

## 2021-05-24 ENCOUNTER — Observation Stay (HOSPITAL_BASED_OUTPATIENT_CLINIC_OR_DEPARTMENT_OTHER): Payer: HMO

## 2021-05-24 DIAGNOSIS — R55 Syncope and collapse: Secondary | ICD-10-CM

## 2021-05-24 DIAGNOSIS — R42 Dizziness and giddiness: Secondary | ICD-10-CM | POA: Diagnosis not present

## 2021-05-24 LAB — ECHOCARDIOGRAM COMPLETE
AR max vel: 3.36 cm2
AV Area VTI: 3.65 cm2
AV Area mean vel: 3.41 cm2
AV Mean grad: 2 mmHg
AV Peak grad: 4.5 mmHg
Ao pk vel: 1.06 m/s
Area-P 1/2: 2.62 cm2
Height: 62 in
MV VTI: 1.86 cm2
S' Lateral: 3.4 cm
Weight: 2589.08 oz

## 2021-05-24 LAB — COMPREHENSIVE METABOLIC PANEL
ALT: 9 U/L (ref 0–44)
AST: 19 U/L (ref 15–41)
Albumin: 3 g/dL — ABNORMAL LOW (ref 3.5–5.0)
Alkaline Phosphatase: 31 U/L — ABNORMAL LOW (ref 38–126)
Anion gap: 8 (ref 5–15)
BUN: 20 mg/dL (ref 8–23)
CO2: 23 mmol/L (ref 22–32)
Calcium: 8.9 mg/dL (ref 8.9–10.3)
Chloride: 107 mmol/L (ref 98–111)
Creatinine, Ser: 1.24 mg/dL — ABNORMAL HIGH (ref 0.44–1.00)
GFR, Estimated: 44 mL/min — ABNORMAL LOW (ref >60)
Glucose, Bld: 111 mg/dL — ABNORMAL HIGH (ref 70–99)
Potassium: 3.7 mmol/L (ref 3.5–5.1)
Sodium: 138 mmol/L (ref 135–145)
Total Bilirubin: 0.5 mg/dL (ref 0.3–1.2)
Total Protein: 5.8 g/dL — ABNORMAL LOW (ref 6.5–8.1)

## 2021-05-24 LAB — CBC WITH DIFFERENTIAL/PLATELET
Abs Immature Granulocytes: 0.01 10*3/uL (ref 0.00–0.07)
Basophils Absolute: 0 10*3/uL (ref 0.0–0.1)
Basophils Relative: 1 %
Eosinophils Absolute: 0.2 10*3/uL (ref 0.0–0.5)
Eosinophils Relative: 3 %
HCT: 29.8 % — ABNORMAL LOW (ref 36.0–46.0)
Hemoglobin: 9.6 g/dL — ABNORMAL LOW (ref 12.0–15.0)
Immature Granulocytes: 0 %
Lymphocytes Relative: 17 %
Lymphs Abs: 0.9 10*3/uL (ref 0.7–4.0)
MCH: 30.6 pg (ref 26.0–34.0)
MCHC: 32.2 g/dL (ref 30.0–36.0)
MCV: 94.9 fL (ref 80.0–100.0)
Monocytes Absolute: 0.7 10*3/uL (ref 0.1–1.0)
Monocytes Relative: 12 %
Neutro Abs: 3.7 10*3/uL (ref 1.7–7.7)
Neutrophils Relative %: 67 %
Platelets: 200 10*3/uL (ref 150–400)
RBC: 3.14 MIL/uL — ABNORMAL LOW (ref 3.87–5.11)
RDW: 13.7 % (ref 11.5–15.5)
WBC: 5.5 10*3/uL (ref 4.0–10.5)
nRBC: 0 % (ref 0.0–0.2)

## 2021-05-24 LAB — RAPID URINE DRUG SCREEN, HOSP PERFORMED
Amphetamines: NOT DETECTED
Barbiturates: NOT DETECTED
Benzodiazepines: NOT DETECTED
Cocaine: NOT DETECTED
Opiates: NOT DETECTED
Tetrahydrocannabinol: NOT DETECTED

## 2021-05-24 LAB — TROPONIN I (HIGH SENSITIVITY)
Troponin I (High Sensitivity): 9 ng/L (ref <18)
Troponin I (High Sensitivity): 9 ng/L (ref <18)

## 2021-05-24 LAB — MAGNESIUM: Magnesium: 1.9 mg/dL (ref 1.7–2.4)

## 2021-05-24 NOTE — Progress Notes (Signed)
PROGRESS NOTE    Taylor Berry  WJX:[id number] DOB: 05-11-1940 DOA: 05/23/2021 PCP: Kelton Pillar, MD   Brief Narrative:  Taylor Berry is a 81 y.o. female with medical history significant for nonobstructive CAD, COPD, chronic anemia with baseline hemoglobin 10-12, hypertension, hyperlipidemia, peripheral polyneuropathy rheumatoid arthritis, chronic systolic/diastolic heart failure, who is admitted to [facility] on 05/23/2021 with dizziness after presenting from home to [facility] ED.  Assessment & Plan:   Principal Problem:   Postural dizziness with presyncope Active Problems:   Pure hypercholesterolemia   Chronic disease anemia   Essential (primary) hypertension   COPD (chronic obstructive pulmonary disease) (HCC)   Non-ischemic cardiomyopathy/diastolic and systolic CHF   GERD (gastroesophageal reflux disease)   Bradycardia   Acute prerenal azotemia   Presyncope in the setting of sinus bradycardia/1st degree AV block -Potentially in the setting of beta-blocker, this has been held -Follow clinically, echocardiogram pending -If heart rate improves with cessation of beta-blocker will likely discharge with outpatient cardiac follow-up, if patient remains symptomatic with profound bradycardia despite medication cessation would discuss with EP over the weekend  AKI, pre-renal/poor PO intake -Secondary to above/poor p.o. intake -Follow clinically, increase p.o. intake as appropriate  COPD, no exacerbation -Resume home medications -including Breo, Incruse  Chronic anemia -Appears to be at baseline, continue to follow repeat labs no signs or symptoms of bleeding  GERD -continue Pepcid HTN -holding home medications given above HLD -continue colestyramine, Lipitor Rheumatoid arthritis -continue as needed analgesics, HF - combined, no exacerbation -continue to follow clinically, appears euvolemic  DVT prophylaxis: Early ambulation Code Status: Full Family Communication:  None present  Status is: Observation  Dispo: The patient is from: Home              Anticipated d/c is to: Home              Anticipated d/c date is: 24 to 48 hours pending clinical course as above              Patient currently not medically stable for discharge  Consultants:  None  Procedures:  None  Antimicrobials:  None indicated  Subjective: No acute issues or events overnight, denies nausea vomiting diarrhea constipation any fevers chills or chest pain  Objective: Vitals:   05/23/21 2015 05/23/21 2100 05/23/21 2137 05/24/21 0438  BP: 140/70 (!) 149/74 91/81 (!) 125/50  Pulse: (!) 51 (!) 53 (!) 55 62  Resp: (!) 23 17 18 18   Temp:  97.6 F (36.4 C) 97.9 F (36.6 C) 98.2 F (36.8 C)  TempSrc:  Oral Oral Oral  SpO2:  100% 98% 98%  Weight:   73.4 kg   Height:   5\' 2"  (1.575 m)     Intake/Output Summary (Last 24 hours) at 05/24/2021 0745 Last data filed at 05/24/2021 0025 Gross per 24 hour  Intake 316.19 ml  Output --  Net 316.19 ml   Filed Weights   05/23/21 2137  Weight: 73.4 kg    Examination:  General:  Pleasantly resting in bed, No acute distress. HEENT:  Normocephalic atraumatic.  Sclerae nonicteric, noninjected.  Extraocular movements intact bilaterally. Neck:  Without mass or deformity.  Trachea is midline. Lungs:  Clear to auscultate bilaterally without rhonchi, wheeze, or rales. Heart: Sinus bradycardia without murmurs, rubs, or gallops. Abdomen:  Soft, nontender, nondistended.  Without guarding or rebound. Extremities: Without cyanosis, clubbing, edema, or obvious deformity. Vascular:  Dorsalis pedis and posterior tibial pulses palpable bilaterally. Skin:  Warm and dry,  no erythema, no ulcerations.  Data Reviewed: I have personally reviewed following labs and imaging studies  CBC: Recent Labs  Lab 05/23/21 1400 05/24/21 0045  WBC 7.1 5.5  NEUTROABS  --  3.7  HGB 10.6* 9.6*  HCT 32.6* 29.8*  MCV 94.2 94.9  PLT 199 213   Basic  Metabolic Panel: Recent Labs  Lab 05/23/21 1400 05/23/21 2153 05/24/21 0045  NA 135  --  138  K 4.6  --  3.7  CL 102  --  107  CO2 24  --  23  GLUCOSE 105*  --  111*  BUN 20  --  20  CREATININE 0.88  --  1.24*  CALCIUM 9.4  --  8.9  MG  --  2.0 1.9   GFR: Estimated Creatinine Clearance: 33.9 mL/min (A) (by C-G formula based on SCr of 1.24 mg/dL (H)). Liver Function Tests: Recent Labs  Lab 05/23/21 1400 05/24/21 0045  AST 22 19  ALT 11 9  ALKPHOS 39 31*  BILITOT 0.6 0.5  PROT 6.7 5.8*  ALBUMIN 3.6 3.0*   No results for input(s): LIPASE, AMYLASE in the last 168 hours. No results for input(s): AMMONIA in the last 168 hours. Coagulation Profile: No results for input(s): INR, PROTIME in the last 168 hours. Cardiac Enzymes: No results for input(s): CKTOTAL, CKMB, CKMBINDEX, TROPONINI in the last 168 hours. BNP (last 3 results) No results for input(s): PROBNP in the last 8760 hours. HbA1C: No results for input(s): HGBA1C in the last 72 hours. CBG: No results for input(s): GLUCAP in the last 168 hours. Lipid Profile: No results for input(s): CHOL, HDL, LDLCALC, TRIG, CHOLHDL, LDLDIRECT in the last 72 hours. Thyroid Function Tests: Recent Labs    05/23/21 2153  TSH 1.577   Anemia Panel: No results for input(s): VITAMINB12, FOLATE, FERRITIN, TIBC, IRON, RETICCTPCT in the last 72 hours. Sepsis Labs: No results for input(s): PROCALCITON, LATICACIDVEN in the last 168 hours.  No results found for this or any previous visit (from the past 240 hour(s)).       Radiology Studies: CT Head Wo Contrast  Result Date: 05/23/2021 CLINICAL DATA:  Acute neuro deficit.  Dizziness EXAM: CT HEAD WITHOUT CONTRAST TECHNIQUE: Contiguous axial images were obtained from the base of the skull through the vertex without intravenous contrast. RADIATION DOSE REDUCTION: This exam was performed according to the departmental dose-optimization program which includes automated exposure control,  adjustment of the mA and/or kV according to patient size and/or use of iterative reconstruction technique. COMPARISON:  CT head 12/30/2020 FINDINGS: Brain: Generalized atrophy. Moderate white matter hypodensity bilaterally appears chronic and unchanged Negative for acute infarct, hemorrhage, mass Vascular: Negative for hyperdense vessel Skull: Negative Sinuses/Orbits: Mild mucosal edema right maxillary sinus. Remaining sinuses clear. No orbital lesion. Other: None IMPRESSION: No acute abnormality. Atrophy and chronic microvascular ischemia in the white matter, stable from the prior study. Electronically Signed   By: Franchot Gallo M.D.   On: 05/23/2021 15:13   MR BRAIN WO CONTRAST  Result Date: 05/23/2021 CLINICAL DATA:  Acute neuro deficit.  Facial droop EXAM: MRI HEAD WITHOUT CONTRAST TECHNIQUE: Multiplanar, multiecho pulse sequences of the brain and surrounding structures were obtained without intravenous contrast. COMPARISON:  CT head 05/23/2021 FINDINGS: Brain: Negative for acute infarct. Generalized atrophy. Chronic microvascular ischemic change in the white matter and pons. Negative for hydrocephalus. Negative for hemorrhage or mass Vascular: Normal arterial flow voids Skull and upper cervical spine: Negative Sinuses/Orbits: Paranasal sinuses clear. Left mastoid effusion. Bilateral cataract  extraction Other: None IMPRESSION: Negative for acute infarct. Moderate chronic microvascular ischemic change in the white matter and pons. Generalized atrophy. Electronically Signed   By: Franchot Gallo M.D.   On: 05/23/2021 17:55   DG Chest Port 1 View  Result Date: 05/23/2021 CLINICAL DATA:  Bradycardia EXAM: PORTABLE CHEST 1 VIEW COMPARISON:  12/30/2020 FINDINGS: Cardiac shadow is mildly prominent but stable. Aortic calcifications are seen. The lungs are well aerated bilaterally. Mild right basilar atelectasis is seen. Stable scarring is noted the left mid as postsurgical changes. No acute abnormality noted.  IMPRESSION: Right basilar atelectasis. Electronically Signed   By: Inez Catalina M.D.   On: 05/23/2021 21:46        Scheduled Meds:  atorvastatin  40 mg Oral Daily   cholestyramine  4 g Oral BID   clopidogrel  75 mg Oral Daily   famotidine  40 mg Oral QPM   fluticasone furoate-vilanterol  1 puff Inhalation Daily   And   umeclidinium bromide  1 puff Inhalation Daily   leflunomide  20 mg Oral Daily     LOS: 0 days   Time spent: 65min  Ozzie Knobel C Nani Ingram, DO [facility]  If 7PM-7AM, please contact night-coverage [email or web address]  05/24/2021, 7:45 AM

## 2021-05-24 NOTE — Care Management CC44 (Signed)
Condition Code 44 Documentation Completed  Patient Details  Name: Taylor Berry MRN: [id number] Date of Birth: 10-20-1940   Condition Code 44 given:  Yes Patient signature on Condition Code 44 notice:  Yes Documentation of 2 MD's agreement:  Yes Code 44 added to claim:  Yes    Milas Gain, [place] 05/24/2021, 2:17 PM

## 2021-05-24 NOTE — Care Management Obs Status (Addendum)
[place] NOTIFICATION   Patient Details  Name: Taylor Berry MRN: [id number] Date of Birth: April 09, 1940   Medicare Observation Status Notification Given:  Yes  Patient refused to sign.  Milas Gain, [place] 05/24/2021, 2:17 PM

## 2021-05-24 NOTE — Progress Notes (Signed)
  Echocardiogram 2D Echocardiogram has been performed.  Taylor Berry 05/24/2021, 10:57 AM

## 2021-05-25 ENCOUNTER — Observation Stay (HOSPITAL_COMMUNITY): Payer: HMO

## 2021-05-25 DIAGNOSIS — R55 Syncope and collapse: Secondary | ICD-10-CM | POA: Diagnosis not present

## 2021-05-25 DIAGNOSIS — R0600 Dyspnea, unspecified: Secondary | ICD-10-CM | POA: Diagnosis not present

## 2021-05-25 DIAGNOSIS — R001 Bradycardia, unspecified: Secondary | ICD-10-CM | POA: Diagnosis not present

## 2021-05-25 DIAGNOSIS — I5022 Chronic systolic (congestive) heart failure: Secondary | ICD-10-CM

## 2021-05-25 DIAGNOSIS — R42 Dizziness and giddiness: Secondary | ICD-10-CM | POA: Diagnosis not present

## 2021-05-25 DIAGNOSIS — N19 Unspecified kidney failure: Secondary | ICD-10-CM | POA: Diagnosis not present

## 2021-05-25 LAB — BASIC METABOLIC PANEL
Anion gap: 5 (ref 5–15)
BUN: 20 mg/dL (ref 8–23)
CO2: 23 mmol/L (ref 22–32)
Calcium: 8.9 mg/dL (ref 8.9–10.3)
Chloride: 110 mmol/L (ref 98–111)
Creatinine, Ser: 0.85 mg/dL (ref 0.44–1.00)
GFR, Estimated: >60 mL/min (ref >60)
Glucose, Bld: 105 mg/dL — ABNORMAL HIGH (ref 70–99)
Potassium: 4 mmol/L (ref 3.5–5.1)
Sodium: 138 mmol/L (ref 135–145)

## 2021-05-25 LAB — CBC
HCT: 28.2 % — ABNORMAL LOW (ref 36.0–46.0)
Hemoglobin: 9.2 g/dL — ABNORMAL LOW (ref 12.0–15.0)
MCH: 30.6 pg (ref 26.0–34.0)
MCHC: 32.6 g/dL (ref 30.0–36.0)
MCV: 93.7 fL (ref 80.0–100.0)
Platelets: 172 10*3/uL (ref 150–400)
RBC: 3.01 MIL/uL — ABNORMAL LOW (ref 3.87–5.11)
RDW: 13.9 % (ref 11.5–15.5)
WBC: 4.9 10*3/uL (ref 4.0–10.5)
nRBC: 0 % (ref 0.0–0.2)

## 2021-05-25 NOTE — Consult Note (Signed)
Cardiology Consultation:   Patient ID: Taylor Berry MRN: [id number]; DOB: Feb 23, 1940  Admit date: 05/23/2021 Date of Consult: 05/25/2021  PCP:  Kelton Pillar, MD   [facility] Providers Cardiologist:  Kirk Ruths, MD        Patient Profile:   Taylor Berry is a 81 y.o. female with a hx of  lung cancer, COPD, OSA, CVA, nonobstructive CAD, heart failure with reduced ejection fraction who is being seen 05/25/2021 for the evaluation of bradycardia at the request of Dr. Avon Gully.  History of Present Illness:   Taylor Berry is an 81 year old female with a history of lung cancer, COPD, OSA, CVA, nonobstructive CAD, heart failure with reduced ejection fraction.  Cath 10/2016 showed mild nonobstructive CAD.  Echocardiogram 03/25/2017 showed EF 30 to 35%.  Echo 12/2020 showed EF had improved to 45 to 50%.  She had stroke in December 2022 that was thought to be secondary to small vessel disease.  She wore monitor 09/2019 which showed sinus bradycardia, normal sinus rhythm, occasional PAC, 4 and 6 beats of PAT and occasional PVCs.  She presented with dizziness.  In ED, heart rate in 40s to 50s, BP stable 130s to 150s, SPO2 high 90s on room air.  Labs notable for creatinine 0.88, WBC 7.1, hemoglobin 10.6.  Head CT showed no acute intracranial process.  MRI brain unremarkable.  EKG showed sinus rhythm, left bundle branch block, rate 51.  Her metoprolol was held (was taking Toprol-XL 25 mg daily).  Echocardiogram 05/24/2021 showed distal septal/apical hypokinesis with EF 50 to 29%, normal diastolic function, normal RV function, no significant valvular disease.   Past Medical History:  Diagnosis Date   Abnormal CT scan    Allergic rhinitis    Anemia 2015   after lung surgery   Angio-edema    Complication of anesthesia    Fentanyl allergy, "claustrophobia"   COPD (chronic obstructive pulmonary disease) ([place])    Depression    Diaphoresis 07/30/2015   Excessive sweating with little  exertion   Difficult intravenous access    GERD (gastroesophageal reflux disease)    In the past   Hyperlipidemia    Hypertension    Left bundle branch block    Lung cancer ([place]) 2015   left upper lobe wedge removed-no further tx.   Neuropathy    Tingling in arms, fingers, and feet (Since 06/15/16)   Obesity    OSA (obstructive sleep apnea)    denies.   Osteoarthritis    ra also   Osteoporosis    Recurrent upper respiratory infection (URI)    Rheumatoid arthritis(714.0)    Dr. Amil Amen   SOB (shortness of breath) on exertion    Uterine cancer ([place]) 1971   tx surgical    Past Surgical History:  Procedure Laterality Date   ABDOMINAL HYSTERECTOMY     in situ carcinoma partial   CARDIAC CATHETERIZATION  01/27/11   minor non-obs CAD, NL EF, mild pulm HTN   CATARACT EXTRACTION, BILATERAL     last done 12'16-   COLONOSCOPY W/ BIOPSIES AND POLYPECTOMY     COLONOSCOPY WITH PROPOFOL N/A 02/13/2015   Procedure: COLONOSCOPY WITH PROPOFOL;  Surgeon: Laurence Spates, MD;  Location: [facility];  Service: Endoscopy;  Laterality: N/A;   ESOPHAGOGASTRODUODENOSCOPY (EGD) WITH PROPOFOL N/A 09/30/2016   Procedure: ESOPHAGOGASTRODUODENOSCOPY (EGD) WITH PROPOFOL;  Surgeon: Laurence Spates, MD;  Location: [facility];  Service: Endoscopy;  Laterality: N/A;   LYMPH NODE DISSECTION Left 04/12/2013   Procedure: LYMPH NODE DISSECTION;  Surgeon: [name], MD;  Location: [facility];  Service: Thoracic;  Laterality: Left;   RIGHT/LEFT HEART CATH AND CORONARY ANGIOGRAPHY N/A 10/08/2016   Procedure: RIGHT/LEFT HEART CATH AND CORONARY ANGIOGRAPHY;  Surgeon: Burnell Blanks, MD;  Location: [facility];  Service: Cardiovascular;  Laterality: N/A;   TONSILLECTOMY     VESICOVAGINAL FISTULA CLOSURE W/ TAH  1971   VIDEO ASSISTED THORACOSCOPY (VATS)/THOROCOTOMY Left 04/12/2013   Procedure: VIDEO ASSISTED THORACOSCOPY (VATS)/THOROCOTOMY, WITH LEFT UPPER LOBE WEDGE RESECTION, CHEST WALL BIOPSY ;  Surgeon:  [name], MD;  Location: [facility];  Service: Thoracic;  Laterality: Left;   VIDEO BRONCHOSCOPY N/A 04/12/2013   Procedure: VIDEO BRONCHOSCOPY;  Surgeon: [name], MD;  Location: [facility];  Service: Thoracic;  Laterality: N/A;       Inpatient Medications: Scheduled Meds:  atorvastatin  40 mg Oral Daily   cholestyramine  4 g Oral BID   clopidogrel  75 mg Oral Daily   famotidine  40 mg Oral QPM   fluticasone furoate-vilanterol  1 puff Inhalation Daily   And   umeclidinium bromide  1 puff Inhalation Daily   leflunomide  20 mg Oral Daily   Continuous Infusions:  PRN Meds: acetaminophen **OR** acetaminophen, albuterol  Allergies:    Allergies  Allergen Reactions   Latex Itching and Rash   Ace Inhibitors Cough   Angiotensin Receptor Blockers Swelling and Other (See Comments)    Tongue swelling   Fentanyl Other (See Comments)    Hallucinations "I go crazy"      Hydrochlorothiazide Other (See Comments)    Decreased Sodium   Hydroxychloroquine Other (See Comments)    "Decreased sodium"   Penicillins Other (See Comments)    Yeast infections Has patient had a PCN reaction causing immediate rash, facial/tongue/throat swelling, SOB or lightheadedness with hypotension:No Has patient had a PCN reaction causing severe rash involving mucus membranes or skin necrosis: No Has patient had a PCN reaction that required hospitalization No Has patient had a PCN reaction occurring within the last 10 years: Yes If all of the above answers are "NO", then may proceed with Cephalosporin use.  Other reaction(s): yeast infection    Social History:   Social History   Socioeconomic History   Marital status: Widowed    Spouse name: Not on file   Number of children: 2   Years of education: College   Highest education level: Master's degree (e.g., MA, MS, MEng, MEd, MSW, MBA)  Occupational History   Occupation: Nurse, adult: [employer]  Tobacco Use   Smoking status: Former     Packs/day: 1.00    Years: 35.00    Pack years: 35.00    Types: Cigarettes    Quit date: 01/07/1988    Years since quitting: 33.4   Smokeless tobacco: Never  Vaping Use   Vaping Use: Never used  Substance and Sexual Activity   Alcohol use: No    Comment: not since 1988   Drug use: No   Sexual activity: Not Currently  Other Topics Concern   Not on file  Social History Narrative   Not on file   Social Determinants of Health   Financial Resource Strain: Not on file  Food Insecurity: Not on file  Transportation Needs: Not on file  Physical Activity: Not on file  Stress: Not on file  Social Connections: Not on file  Intimate Partner Violence: Not on file    Family History:    Family  History  Problem Relation Age of Onset   Lung cancer Mother    Coronary artery disease Son    Heart attack Son 49   Arthritis/Rheumatoid Maternal Grandmother    Coronary artery disease Son    Arthritis/Rheumatoid Son      ROS:  Please see the history of present illness.   All other ROS reviewed and negative.     Physical Exam/Data:   Vitals:   05/24/21 2034 05/25/21 0309 05/25/21 0807 05/25/21 0900  BP: (!) 142/60 (!) 150/69    Pulse: (!) 57 70  70  Resp: 17 15  18   Temp: 98.3 F (36.8 C) 97.8 F (36.6 C)    TempSrc: Oral Oral    SpO2: 97% 98% 98% 99%  Weight:  73.4 kg    Height:        Intake/Output Summary (Last 24 hours) at 05/25/2021 1405 Last data filed at 05/25/2021 0800 Gross per 24 hour  Intake 360 ml  Output --  Net 360 ml      05/25/2021    3:09 AM 05/23/2021    9:37 PM 03/27/2021    2:21 PM  Last 3 Weights  Weight (lbs) 161 lb 14.4 oz 161 lb 13.1 oz 161 lb  Weight (kg) 73.437 kg 73.4 kg 73.029 kg     Body mass index is 29.61 kg/m.  General:   in no acute distress HEENT: normal Neck: mild + JVD Vascular: No carotid bruits; Distal pulses 2+ bilaterally Cardiac:  normal S1, S2; RRR; no murmur  Lungs:  basilar crackles Abd: soft, nontender Ext: no  edema Musculoskeletal:  No deformities, BUE and BLE strength normal and equal Skin: warm and dry  Neuro:  no focal abnormalities noted Psych:  Normal affect   EKG:  The EKG was personally reviewed and demonstrates: Left bundle branch block, first-degree AV block, rate 50 Telemetry:  Telemetry was personally reviewed and demonstrates: Normal sinus rhythm with rate 50s to 70s  Relevant CV Studies:   Laboratory Data:  High Sensitivity Troponin:   Recent Labs  Lab 05/23/21 2153 05/24/21 0045 05/24/21 0710  TROPONINIHS 8 9 9      Chemistry Recent Labs  Lab 05/23/21 1400 05/23/21 2153 05/24/21 0045 05/25/21 0151  NA 135  --  138 138  K 4.6  --  3.7 4.0  CL 102  --  107 110  CO2 24  --  23 23  GLUCOSE 105*  --  111* 105*  BUN 20  --  20 20  CREATININE 0.88  --  1.24* 0.85  CALCIUM 9.4  --  8.9 8.9  MG  --  2.0 1.9  --   GFRNONAA >60  --  44* >60  ANIONGAP 9  --  8 5    Recent Labs  Lab 05/23/21 1400 05/24/21 0045  PROT 6.7 5.8*  ALBUMIN 3.6 3.0*  AST 22 19  ALT 11 9  ALKPHOS 39 31*  BILITOT 0.6 0.5   Lipids No results for input(s): CHOL, TRIG, HDL, LABVLDL, LDLCALC, CHOLHDL in the last 168 hours.  Hematology Recent Labs  Lab 05/23/21 1400 05/24/21 0045 05/25/21 0151  WBC 7.1 5.5 4.9  RBC 3.46* 3.14* 3.01*  HGB 10.6* 9.6* 9.2*  HCT 32.6* 29.8* 28.2*  MCV 94.2 94.9 93.7  MCH 30.6 30.6 30.6  MCHC 32.5 32.2 32.6  RDW 13.6 13.7 13.9  PLT 199 200 172   Thyroid  Recent Labs  Lab 05/23/21 2153  TSH 1.577    BNPNo  results for input(s): BNP, PROBNP in the last 168 hours.  DDimer No results for input(s): DDIMER in the last 168 hours.   Radiology/Studies:  CT Head Wo Contrast  Result Date: 05/23/2021 CLINICAL DATA:  Acute neuro deficit.  Dizziness EXAM: CT HEAD WITHOUT CONTRAST TECHNIQUE: Contiguous axial images were obtained from the base of the skull through the vertex without intravenous contrast. RADIATION DOSE REDUCTION: This exam was performed  according to the departmental dose-optimization program which includes automated exposure control, adjustment of the mA and/or kV according to patient size and/or use of iterative reconstruction technique. COMPARISON:  CT head 12/30/2020 FINDINGS: Brain: Generalized atrophy. Moderate white matter hypodensity bilaterally appears chronic and unchanged Negative for acute infarct, hemorrhage, mass Vascular: Negative for hyperdense vessel Skull: Negative Sinuses/Orbits: Mild mucosal edema right maxillary sinus. Remaining sinuses clear. No orbital lesion. Other: None IMPRESSION: No acute abnormality. Atrophy and chronic microvascular ischemia in the white matter, stable from the prior study. Electronically Signed   By: Franchot Gallo M.D.   On: 05/23/2021 15:13   MR BRAIN WO CONTRAST  Result Date: 05/23/2021 CLINICAL DATA:  Acute neuro deficit.  Facial droop EXAM: MRI HEAD WITHOUT CONTRAST TECHNIQUE: Multiplanar, multiecho pulse sequences of the brain and surrounding structures were obtained without intravenous contrast. COMPARISON:  CT head 05/23/2021 FINDINGS: Brain: Negative for acute infarct. Generalized atrophy. Chronic microvascular ischemic change in the white matter and pons. Negative for hydrocephalus. Negative for hemorrhage or mass Vascular: Normal arterial flow voids Skull and upper cervical spine: Negative Sinuses/Orbits: Paranasal sinuses clear. Left mastoid effusion. Bilateral cataract extraction Other: None IMPRESSION: Negative for acute infarct. Moderate chronic microvascular ischemic change in the white matter and pons. Generalized atrophy. Electronically Signed   By: Franchot Gallo M.D.   On: 05/23/2021 17:55   DG Chest Port 1 View  Result Date: 05/23/2021 CLINICAL DATA:  Bradycardia EXAM: PORTABLE CHEST 1 VIEW COMPARISON:  12/30/2020 FINDINGS: Cardiac shadow is mildly prominent but stable. Aortic calcifications are seen. The lungs are well aerated bilaterally. Mild right basilar atelectasis is  seen. Stable scarring is noted the left mid as postsurgical changes. No acute abnormality noted. IMPRESSION: Right basilar atelectasis. Electronically Signed   By: Inez Catalina M.D.   On: 05/23/2021 21:46   ECHOCARDIOGRAM COMPLETE  Result Date: 05/24/2021    ECHOCARDIOGRAM REPORT   Patient Name:   SARYN CHERRY Date of Exam: 05/24/2021 Medical Rec #:  [id number]        Height:       62.0 in Accession #:    [id number]       Weight:       161.8 lb Date of Birth:  November 18, 1940        BSA:          1.747 m Patient Age:    35 years         BP:           125/50 mmHg Patient Gender: F                HR:           59 bpm. Exam Location:  Inpatient Procedure: 2D Echo, Cardiac Doppler and Color Doppler Indications:    Syncope  History:        Patient has prior history of Echocardiogram examinations, most                 recent 11/02/2020. Cardiomyopathy and CHF, Pulmonary HTN and  COPD; Risk Factors:Sleep Apnea and Hypertension.  Sonographer:    Clayton Lefort RDCS (AE) Referring Phys: [id number] [place]  1. Distal septal / apical hypokinesis . Left ventricular ejection fraction, by estimation, is 50 to 55%. The left ventricle has low normal function. The left ventricle has no regional wall motion abnormalities. There is mild left ventricular hypertrophy. Left ventricular diastolic parameters were normal.  2. Right ventricular systolic function is normal. The right ventricular size is normal. There is normal pulmonary artery systolic pressure.  3. Left atrial size was mildly dilated.  4. The mitral valve is abnormal. Trivial mitral valve regurgitation. No evidence of mitral stenosis.  5. The aortic valve is tricuspid. There is mild calcification of the aortic valve. There is mild thickening of the aortic valve. Aortic valve regurgitation is trivial. Aortic valve sclerosis/calcification is present, without any evidence of aortic stenosis.  6. The inferior vena cava is normal in size with  greater than 50% respiratory variability, suggesting right atrial pressure of 3 mmHg. FINDINGS  Left Ventricle: Distal septal / apical hypokinesis. Left ventricular ejection fraction, by estimation, is 50 to 55%. The left ventricle has low normal function. The left ventricle has no regional wall motion abnormalities. The left ventricular internal cavity size was normal in size. There is mild left ventricular hypertrophy. Left ventricular diastolic parameters were normal. Right Ventricle: The right ventricular size is normal. No increase in right ventricular wall thickness. Right ventricular systolic function is normal. There is normal pulmonary artery systolic pressure. The tricuspid regurgitant velocity is 2.62 m/s, and  with an assumed right atrial pressure of 8 mmHg, the estimated right ventricular systolic pressure is 96.2 mmHg. Left Atrium: Left atrial size was mildly dilated. Right Atrium: Right atrial size was normal in size. Pericardium: There is no evidence of pericardial effusion. Mitral Valve: The mitral valve is abnormal. There is mild thickening of the mitral valve leaflet(s). There is mild calcification of the mitral valve leaflet(s). Trivial mitral valve regurgitation. No evidence of mitral valve stenosis. MV peak gradient, 6.1 mmHg. The mean mitral valve gradient is 2.0 mmHg. Tricuspid Valve: The tricuspid valve is normal in structure. Tricuspid valve regurgitation is mild . No evidence of tricuspid stenosis. Aortic Valve: The aortic valve is tricuspid. There is mild calcification of the aortic valve. There is mild thickening of the aortic valve. Aortic valve regurgitation is trivial. Aortic valve sclerosis/calcification is present, without any evidence of aortic stenosis. Aortic valve mean gradient measures 2.0 mmHg. Aortic valve peak gradient measures 4.5 mmHg. Aortic valve area, by VTI measures 3.65 cm. Pulmonic Valve: The pulmonic valve was normal in structure. Pulmonic valve regurgitation is  trivial. No evidence of pulmonic stenosis. Aorta: The aortic root is normal in size and structure. Venous: The inferior vena cava is normal in size with greater than 50% respiratory variability, suggesting right atrial pressure of 3 mmHg. IAS/Shunts: No atrial level shunt detected by color flow Doppler.  LEFT VENTRICLE PLAX 2D LVIDd:         5.00 cm   Diastology LVIDs:         3.40 cm   LV e' medial:    7.07 cm/s LV PW:         1.10 cm   LV E/e' medial:  13.6 LV IVS:        1.20 cm   LV e' lateral:   11.50 cm/s LVOT diam:     2.20 cm   LV E/e' lateral: 8.3 LV SV:  82 LV SV Index:   47 LVOT Area:     3.80 cm  RIGHT VENTRICLE             IVC RV Basal diam:  2.60 cm     IVC diam: 1.80 cm RV S prime:     15.00 cm/s TAPSE (M-mode): 2.5 cm LEFT ATRIUM             Index        RIGHT ATRIUM           Index LA diam:        4.00 cm 2.29 cm/m   RA Area:     16.00 cm LA Vol (A2C):   88.9 ml 50.88 ml/m  RA Volume:   37.20 ml  21.29 ml/m LA Vol (A4C):   68.2 ml 39.04 ml/m LA Biplane Vol: 80.7 ml 46.19 ml/m  AORTIC VALVE AV Area (Vmax):    3.36 cm AV Area (Vmean):   3.41 cm AV Area (VTI):     3.65 cm AV Vmax:           106.00 cm/s AV Vmean:          67.700 cm/s AV VTI:            0.226 m AV Peak Grad:      4.5 mmHg AV Mean Grad:      2.0 mmHg LVOT Vmax:         93.80 cm/s LVOT Vmean:        60.800 cm/s LVOT VTI:          0.217 m LVOT/AV VTI ratio: 0.96  AORTA Ao Root diam: 3.30 cm Ao Asc diam:  3.40 cm MITRAL VALVE                TRICUSPID VALVE MV Area (PHT): 2.62 cm     TR Peak grad:   27.5 mmHg MV Area VTI:   1.86 cm     TR Vmax:        262.00 cm/s MV Peak grad:  6.1 mmHg MV Mean grad:  2.0 mmHg     SHUNTS MV Vmax:       1.23 m/s     Systemic VTI:  0.22 m MV Vmean:      64.5 cm/s    Systemic Diam: 2.20 cm MV Decel Time: 290 msec MV E velocity: 95.90 cm/s MV A velocity: 112.00 cm/s MV E/A ratio:  0.86 Jenkins Rouge MD Electronically signed by Jenkins Rouge MD Signature Date/Time: 05/24/2021/5:44:33 PM    Final       Assessment and Plan:   Bradycardia: Heart rate 40-50s on admission, has improved to 50s to 70s since holding metoprolol.  Continue to hold metoprolol.   Chronic heart failure with reduced ejection fraction: EF 45 to 50% on echo 12/2020, echo this admission showing 50 to 55%.  ARB was held in setting of AKI on admission.  Toprol-XL held due to bradycardia.  Renal function improved.  Appears mildly hypervolemic, will check chest x-ray, BNP.    CVA-continue Plavix, statin   Hypertension-holding Toprol-XL, ARB as above   Hyperlipidemia-continue statin.     For questions or updates, please contact [facility] Please consult [email or web address] for contact info under    Signed, Donato Heinz, MD  05/25/2021 2:05 PM

## 2021-05-25 NOTE — Plan of Care (Signed)

## 2021-05-25 NOTE — Progress Notes (Signed)
PROGRESS NOTE    Taylor Berry  TWS:[id number] DOB: Sep 21, 1940 DOA: 05/23/2021 PCP: Kelton Pillar, MD   Brief Narrative:  Taylor Berry is a 81 y.o. female with medical history significant for nonobstructive CAD, COPD, chronic anemia with baseline hemoglobin 10-12, hypertension, hyperlipidemia, peripheral polyneuropathy rheumatoid arthritis, chronic systolic/diastolic heart failure, who is admitted to [facility] on 05/23/2021 with dizziness after presenting from home to [facility] ED.  Assessment & Plan:   Principal Problem:   Postural dizziness with presyncope Active Problems:   Pure hypercholesterolemia   Chronic disease anemia   Essential (primary) hypertension   COPD (chronic obstructive pulmonary disease) (HCC)   Non-ischemic cardiomyopathy/diastolic and systolic CHF   GERD (gastroesophageal reflux disease)   Bradycardia   Acute prerenal azotemia   Presyncope in the setting of sinus bradycardia/1st degree AV block -Remains symptomatic despite cessation of beta-blocker -Cardiology requested to evaluate, heart rate appears to be improving today with activity but patient remains "profoundly dizzy with near syncope" per our discussion this morning -Echo as below shows diffuse hypokinesis, consistent with previous echo, defer to cardiology for further testing  AKI, pre-renal/poor PO intake -Secondary to above/poor p.o. intake -Follow clinically, increase p.o. intake as appropriate  COPD, no exacerbation -Resume home medications -including Breo, Incruse  Chronic anemia -Appears to be at baseline, continue to follow repeat labs no signs or symptoms of bleeding  GERD -continue Pepcid HTN -holding home medications given above HLD -continue colestyramine, Lipitor Rheumatoid arthritis -continue as needed analgesics, HF - combined, no exacerbation -continue to follow clinically, appears euvolemic -likely resume Lasix in the next 24 to 48 hours pending clinical  status  DVT prophylaxis: Early ambulation Code Status: Full Family Communication: None present  Status is: Observation  Dispo: The patient is from: Home              Anticipated d/c is to: Home              Anticipated d/c date is: 24 to 48 hours pending clinical course as above              Patient currently not medically stable for discharge  Consultants:  Cardiology  Procedures:  None  Antimicrobials:  None indicated  Subjective: No acute issues or events overnight, denies nausea vomiting diarrhea constipation any fevers chills or chest pain -continues to complain of near syncope and dizziness today with exertion  Objective: Vitals:   05/24/21 2034 05/25/21 0309 05/25/21 0807 05/25/21 0900  BP: (!) 142/60 (!) 150/69    Pulse: (!) 57 70  70  Resp: 17 15  18   Temp: 98.3 F (36.8 C) 97.8 F (36.6 C)    TempSrc: Oral Oral    SpO2: 97% 98% 98% 99%  Weight:  73.4 kg    Height:        Intake/Output Summary (Last 24 hours) at 05/25/2021 1313 Last data filed at 05/25/2021 0800 Gross per 24 hour  Intake 360 ml  Output --  Net 360 ml    Filed Weights   05/23/21 2137 05/25/21 0309  Weight: 73.4 kg 73.4 kg    Examination:  General:  Pleasantly resting in bed, No acute distress. HEENT:  Normocephalic atraumatic.  Sclerae nonicteric, noninjected.  Extraocular movements intact bilaterally. Neck:  Without mass or deformity.  Trachea is midline. Lungs:  Clear to auscultate bilaterally without rhonchi, wheeze, or rales. Heart: Sinus bradycardia without murmurs, rubs, or gallops. Abdomen:  Soft, nontender, nondistended.  Without guarding or rebound. Extremities:  Without cyanosis, clubbing, edema, or obvious deformity. Vascular:  Dorsalis pedis and posterior tibial pulses palpable bilaterally. Skin:  Warm and dry, no erythema, no ulcerations.  Data Reviewed: I have personally reviewed following labs and imaging studies  CBC: Recent Labs  Lab 05/23/21 1400  05/24/21 0045 05/25/21 0151  WBC 7.1 5.5 4.9  NEUTROABS  --  3.7  --   HGB 10.6* 9.6* 9.2*  HCT 32.6* 29.8* 28.2*  MCV 94.2 94.9 93.7  PLT 199 200 250    Basic Metabolic Panel: Recent Labs  Lab 05/23/21 1400 05/23/21 2153 05/24/21 0045 05/25/21 0151  NA 135  --  138 138  K 4.6  --  3.7 4.0  CL 102  --  107 110  CO2 24  --  23 23  GLUCOSE 105*  --  111* 105*  BUN 20  --  20 20  CREATININE 0.88  --  1.24* 0.85  CALCIUM 9.4  --  8.9 8.9  MG  --  2.0 1.9  --     GFR: Estimated Creatinine Clearance: 49.5 mL/min (by C-G formula based on SCr of 0.85 mg/dL). Liver Function Tests: Recent Labs  Lab 05/23/21 1400 05/24/21 0045  AST 22 19  ALT 11 9  ALKPHOS 39 31*  BILITOT 0.6 0.5  PROT 6.7 5.8*  ALBUMIN 3.6 3.0*    No results for input(s): LIPASE, AMYLASE in the last 168 hours. No results for input(s): AMMONIA in the last 168 hours. Coagulation Profile: No results for input(s): INR, PROTIME in the last 168 hours. Cardiac Enzymes: No results for input(s): CKTOTAL, CKMB, CKMBINDEX, TROPONINI in the last 168 hours. BNP (last 3 results) No results for input(s): PROBNP in the last 8760 hours. HbA1C: No results for input(s): HGBA1C in the last 72 hours. CBG: No results for input(s): GLUCAP in the last 168 hours. Lipid Profile: No results for input(s): CHOL, HDL, LDLCALC, TRIG, CHOLHDL, LDLDIRECT in the last 72 hours. Thyroid Function Tests: Recent Labs    05/23/21 2153  TSH 1.577    Anemia Panel: No results for input(s): VITAMINB12, FOLATE, FERRITIN, TIBC, IRON, RETICCTPCT in the last 72 hours. Sepsis Labs: No results for input(s): PROCALCITON, LATICACIDVEN in the last 168 hours.  No results found for this or any previous visit (from the past 240 hour(s)).       Radiology Studies: CT Head Wo Contrast  Result Date: 05/23/2021 CLINICAL DATA:  Acute neuro deficit.  Dizziness EXAM: CT HEAD WITHOUT CONTRAST TECHNIQUE: Contiguous axial images were obtained from  the base of the skull through the vertex without intravenous contrast. RADIATION DOSE REDUCTION: This exam was performed according to the departmental dose-optimization program which includes automated exposure control, adjustment of the mA and/or kV according to patient size and/or use of iterative reconstruction technique. COMPARISON:  CT head 12/30/2020 FINDINGS: Brain: Generalized atrophy. Moderate white matter hypodensity bilaterally appears chronic and unchanged Negative for acute infarct, hemorrhage, mass Vascular: Negative for hyperdense vessel Skull: Negative Sinuses/Orbits: Mild mucosal edema right maxillary sinus. Remaining sinuses clear. No orbital lesion. Other: None IMPRESSION: No acute abnormality. Atrophy and chronic microvascular ischemia in the white matter, stable from the prior study. Electronically Signed   By: Franchot Gallo M.D.   On: 05/23/2021 15:13   MR BRAIN WO CONTRAST  Result Date: 05/23/2021 CLINICAL DATA:  Acute neuro deficit.  Facial droop EXAM: MRI HEAD WITHOUT CONTRAST TECHNIQUE: Multiplanar, multiecho pulse sequences of the brain and surrounding structures were obtained without intravenous contrast. COMPARISON:  CT head  05/23/2021 FINDINGS: Brain: Negative for acute infarct. Generalized atrophy. Chronic microvascular ischemic change in the white matter and pons. Negative for hydrocephalus. Negative for hemorrhage or mass Vascular: Normal arterial flow voids Skull and upper cervical spine: Negative Sinuses/Orbits: Paranasal sinuses clear. Left mastoid effusion. Bilateral cataract extraction Other: None IMPRESSION: Negative for acute infarct. Moderate chronic microvascular ischemic change in the white matter and pons. Generalized atrophy. Electronically Signed   By: Franchot Gallo M.D.   On: 05/23/2021 17:55   DG Chest Port 1 View  Result Date: 05/23/2021 CLINICAL DATA:  Bradycardia EXAM: PORTABLE CHEST 1 VIEW COMPARISON:  12/30/2020 FINDINGS: Cardiac shadow is mildly  prominent but stable. Aortic calcifications are seen. The lungs are well aerated bilaterally. Mild right basilar atelectasis is seen. Stable scarring is noted the left mid as postsurgical changes. No acute abnormality noted. IMPRESSION: Right basilar atelectasis. Electronically Signed   By: Inez Catalina M.D.   On: 05/23/2021 21:46   ECHOCARDIOGRAM COMPLETE  Result Date: 05/24/2021    ECHOCARDIOGRAM REPORT   Patient Name:   Taylor Berry Date of Exam: 05/24/2021 Medical Rec #:  [id number]        Height:       62.0 in Accession #:    [id number]       Weight:       161.8 lb Date of Birth:  03/25/1940        BSA:          1.747 m Patient Age:    75 years         BP:           125/50 mmHg Patient Gender: F                HR:           59 bpm. Exam Location:  Inpatient Procedure: 2D Echo, Cardiac Doppler and Color Doppler Indications:    Syncope  History:        Patient has prior history of Echocardiogram examinations, most                 recent 11/02/2020. Cardiomyopathy and CHF, Pulmonary HTN and                 COPD; Risk Factors:Sleep Apnea and Hypertension.  Sonographer:    Clayton Lefort RDCS (AE) Referring Phys: [id number] [place]  1. Distal septal / apical hypokinesis . Left ventricular ejection fraction, by estimation, is 50 to 55%. The left ventricle has low normal function. The left ventricle has no regional wall motion abnormalities. There is mild left ventricular hypertrophy. Left ventricular diastolic parameters were normal.  2. Right ventricular systolic function is normal. The right ventricular size is normal. There is normal pulmonary artery systolic pressure.  3. Left atrial size was mildly dilated.  4. The mitral valve is abnormal. Trivial mitral valve regurgitation. No evidence of mitral stenosis.  5. The aortic valve is tricuspid. There is mild calcification of the aortic valve. There is mild thickening of the aortic valve. Aortic valve regurgitation is trivial. Aortic valve  sclerosis/calcification is present, without any evidence of aortic stenosis.  6. The inferior vena cava is normal in size with greater than 50% respiratory variability, suggesting right atrial pressure of 3 mmHg. FINDINGS  Left Ventricle: Distal septal / apical hypokinesis. Left ventricular ejection fraction, by estimation, is 50 to 55%. The left ventricle has low normal function. The left ventricle has no regional wall motion abnormalities. The left  ventricular internal cavity size was normal in size. There is mild left ventricular hypertrophy. Left ventricular diastolic parameters were normal. Right Ventricle: The right ventricular size is normal. No increase in right ventricular wall thickness. Right ventricular systolic function is normal. There is normal pulmonary artery systolic pressure. The tricuspid regurgitant velocity is 2.62 m/s, and  with an assumed right atrial pressure of 8 mmHg, the estimated right ventricular systolic pressure is 32.2 mmHg. Left Atrium: Left atrial size was mildly dilated. Right Atrium: Right atrial size was normal in size. Pericardium: There is no evidence of pericardial effusion. Mitral Valve: The mitral valve is abnormal. There is mild thickening of the mitral valve leaflet(s). There is mild calcification of the mitral valve leaflet(s). Trivial mitral valve regurgitation. No evidence of mitral valve stenosis. MV peak gradient, 6.1 mmHg. The mean mitral valve gradient is 2.0 mmHg. Tricuspid Valve: The tricuspid valve is normal in structure. Tricuspid valve regurgitation is mild . No evidence of tricuspid stenosis. Aortic Valve: The aortic valve is tricuspid. There is mild calcification of the aortic valve. There is mild thickening of the aortic valve. Aortic valve regurgitation is trivial. Aortic valve sclerosis/calcification is present, without any evidence of aortic stenosis. Aortic valve mean gradient measures 2.0 mmHg. Aortic valve peak gradient measures 4.5 mmHg. Aortic valve  area, by VTI measures 3.65 cm. Pulmonic Valve: The pulmonic valve was normal in structure. Pulmonic valve regurgitation is trivial. No evidence of pulmonic stenosis. Aorta: The aortic root is normal in size and structure. Venous: The inferior vena cava is normal in size with greater than 50% respiratory variability, suggesting right atrial pressure of 3 mmHg. IAS/Shunts: No atrial level shunt detected by color flow Doppler.  LEFT VENTRICLE PLAX 2D LVIDd:         5.00 cm   Diastology LVIDs:         3.40 cm   LV e' medial:    7.07 cm/s LV PW:         1.10 cm   LV E/e' medial:  13.6 LV IVS:        1.20 cm   LV e' lateral:   11.50 cm/s LVOT diam:     2.20 cm   LV E/e' lateral: 8.3 LV SV:         82 LV SV Index:   47 LVOT Area:     3.80 cm  RIGHT VENTRICLE             IVC RV Basal diam:  2.60 cm     IVC diam: 1.80 cm RV S prime:     15.00 cm/s TAPSE (M-mode): 2.5 cm LEFT ATRIUM             Index        RIGHT ATRIUM           Index LA diam:        4.00 cm 2.29 cm/m   RA Area:     16.00 cm LA Vol (A2C):   88.9 ml 50.88 ml/m  RA Volume:   37.20 ml  21.29 ml/m LA Vol (A4C):   68.2 ml 39.04 ml/m LA Biplane Vol: 80.7 ml 46.19 ml/m  AORTIC VALVE AV Area (Vmax):    3.36 cm AV Area (Vmean):   3.41 cm AV Area (VTI):     3.65 cm AV Vmax:           106.00 cm/s AV Vmean:          67.700 cm/s AV  VTI:            0.226 m AV Peak Grad:      4.5 mmHg AV Mean Grad:      2.0 mmHg LVOT Vmax:         93.80 cm/s LVOT Vmean:        60.800 cm/s LVOT VTI:          0.217 m LVOT/AV VTI ratio: 0.96  AORTA Ao Root diam: 3.30 cm Ao Asc diam:  3.40 cm MITRAL VALVE                TRICUSPID VALVE MV Area (PHT): 2.62 cm     TR Peak grad:   27.5 mmHg MV Area VTI:   1.86 cm     TR Vmax:        262.00 cm/s MV Peak grad:  6.1 mmHg MV Mean grad:  2.0 mmHg     SHUNTS MV Vmax:       1.23 m/s     Systemic VTI:  0.22 m MV Vmean:      64.5 cm/s    Systemic Diam: 2.20 cm MV Decel Time: 290 msec MV E velocity: 95.90 cm/s MV A velocity: 112.00 cm/s MV E/A  ratio:  0.86 Jenkins Rouge MD Electronically signed by Jenkins Rouge MD Signature Date/Time: 05/24/2021/5:44:33 PM    Final         Scheduled Meds:  atorvastatin  40 mg Oral Daily   cholestyramine  4 g Oral BID   clopidogrel  75 mg Oral Daily   famotidine  40 mg Oral QPM   fluticasone furoate-vilanterol  1 puff Inhalation Daily   And   umeclidinium bromide  1 puff Inhalation Daily   leflunomide  20 mg Oral Daily     LOS: 0 days   Time spent: 4min  Kaena Santori C Terri Rorrer, DO [facility]  If 7PM-7AM, please contact night-coverage [email or web address]  05/25/2021, 1:13 PM

## 2021-05-25 NOTE — Progress Notes (Signed)
Pt ambulated on hallway for about 100 ft.  Her O2 sat remained above 90% on RA. Stated she was dizzy when returning back to her room.  Idolina Primer, RN

## 2021-05-26 ENCOUNTER — Other Ambulatory Visit: Payer: Self-pay | Admitting: Cardiology

## 2021-05-26 DIAGNOSIS — R42 Dizziness and giddiness: Secondary | ICD-10-CM | POA: Diagnosis not present

## 2021-05-26 DIAGNOSIS — R001 Bradycardia, unspecified: Secondary | ICD-10-CM

## 2021-05-26 DIAGNOSIS — I5022 Chronic systolic (congestive) heart failure: Secondary | ICD-10-CM | POA: Diagnosis not present

## 2021-05-26 DIAGNOSIS — N19 Unspecified kidney failure: Secondary | ICD-10-CM | POA: Diagnosis not present

## 2021-05-26 DIAGNOSIS — R55 Syncope and collapse: Secondary | ICD-10-CM | POA: Diagnosis not present

## 2021-05-26 LAB — CBC
HCT: 29.7 % — ABNORMAL LOW (ref 36.0–46.0)
Hemoglobin: 9.5 g/dL — ABNORMAL LOW (ref 12.0–15.0)
MCH: 30.4 pg (ref 26.0–34.0)
MCHC: 32 g/dL (ref 30.0–36.0)
MCV: 94.9 fL (ref 80.0–100.0)
Platelets: 202 10*3/uL (ref 150–400)
RBC: 3.13 MIL/uL — ABNORMAL LOW (ref 3.87–5.11)
RDW: 13.8 % (ref 11.5–15.5)
WBC: 4.8 10*3/uL (ref 4.0–10.5)
nRBC: 0 % (ref 0.0–0.2)

## 2021-05-26 LAB — BRAIN NATRIURETIC PEPTIDE: B Natriuretic Peptide: 45.5 pg/mL (ref 0.0–100.0)

## 2021-05-26 LAB — BASIC METABOLIC PANEL
Anion gap: 5 (ref 5–15)
BUN: 17 mg/dL (ref 8–23)
CO2: 22 mmol/L (ref 22–32)
Calcium: 9 mg/dL (ref 8.9–10.3)
Chloride: 110 mmol/L (ref 98–111)
Creatinine, Ser: 0.81 mg/dL (ref 0.44–1.00)
GFR, Estimated: >60 mL/min (ref >60)
Glucose, Bld: 95 mg/dL (ref 70–99)
Potassium: 4 mmol/L (ref 3.5–5.1)
Sodium: 137 mmol/L (ref 135–145)

## 2021-05-26 MED ORDER — FUROSEMIDE 20 MG PO TABS
20.0000 mg | ORAL_TABLET | Freq: Every day | ORAL | Status: DC
  Administered 2021-05-26: 20 mg via ORAL
  Filled 2021-05-26: qty 1

## 2021-05-26 MED ORDER — IRBESARTAN 75 MG PO TABS
75.0000 mg | ORAL_TABLET | Freq: Every day | ORAL | Status: DC
  Administered 2021-05-26: 75 mg via ORAL
  Filled 2021-05-26: qty 1

## 2021-05-26 NOTE — Progress Notes (Signed)
OT Cancellation Note  Patient Details Name: Taylor Berry MRN: [id number] DOB: December 24, 1940   Cancelled Treatment:    Reason Eval/Treat Not Completed: Other (comment) Currently with breakfast tray. Will follow-up as schedule permits for OT eval.  Layla Maw 05/26/2021, 7:25 AM

## 2021-05-26 NOTE — Progress Notes (Signed)
Progress Note  Patient Name: Taylor Berry Date of Encounter: 05/26/2021  [facility] Cardiologist: Kirk Ruths, MD   Subjective   BP 142/77 this morning.  Creatinine stable at 0.8.  Inpatient Medications    Scheduled Meds:  atorvastatin  40 mg Oral Daily   cholestyramine  4 g Oral BID   clopidogrel  75 mg Oral Daily   famotidine  40 mg Oral QPM   fluticasone furoate-vilanterol  1 puff Inhalation Daily   And   umeclidinium bromide  1 puff Inhalation Daily   furosemide  20 mg Oral Daily   irbesartan  75 mg Oral Daily   leflunomide  20 mg Oral Daily   Continuous Infusions:  PRN Meds: acetaminophen **OR** acetaminophen, albuterol   Vital Signs    Vitals:   05/25/21 2329 05/26/21 0448 05/26/21 0455 05/26/21 0753  BP: (!) 163/69   (!) 142/77  Pulse: 68   97  Resp: 20   20  Temp: 98.1 F (36.7 C) 97.8 F (36.6 C)  97.8 F (36.6 C)  TempSrc: Oral Oral  Oral  SpO2: 97%   98%  Weight:   74.2 kg   Height:        Intake/Output Summary (Last 24 hours) at 05/26/2021 1059 Last data filed at 05/26/2021 0457 Gross per 24 hour  Intake 120 ml  Output 601 ml  Net -481 ml      05/26/2021    4:55 AM 05/25/2021    3:09 AM 05/23/2021    9:37 PM  Last 3 Weights  Weight (lbs) 163 lb 9.6 oz 161 lb 14.4 oz 161 lb 13.1 oz  Weight (kg) 74.208 kg 73.437 kg 73.4 kg      Telemetry    NSR, rate 60-80s - Personally Reviewed  ECG    No new EC - Personally Reviewed  Physical Exam   GEN: No acute distress.   Neck: No JVD Cardiac: RRR, no murmurs, rubs, or gallops.  Respiratory: Basilar crackles GI: Soft, nontender, non-distended  MS: No edema; No deformity. Neuro:  Nonfocal  Psych: Normal affect   Labs    High Sensitivity Troponin:   Recent Labs  Lab 05/23/21 2153 05/24/21 0045 05/24/21 0710  TROPONINIHS 8 9 9      Chemistry Recent Labs  Lab 05/23/21 1400 05/23/21 2153 05/24/21 0045 05/25/21 0151 05/26/21 0255  NA 135  --  138 138 137  K 4.6  --   3.7 4.0 4.0  CL 102  --  107 110 110  CO2 24  --  23 23 22   GLUCOSE 105*  --  111* 105* 95  BUN 20  --  20 20 17   CREATININE 0.88  --  1.24* 0.85 0.81  CALCIUM 9.4  --  8.9 8.9 9.0  MG  --  2.0 1.9  --   --   PROT 6.7  --  5.8*  --   --   ALBUMIN 3.6  --  3.0*  --   --   AST 22  --  19  --   --   ALT 11  --  9  --   --   ALKPHOS 39  --  31*  --   --   BILITOT 0.6  --  0.5  --   --   GFRNONAA >60  --  44* >60 >60  ANIONGAP 9  --  8 5 5     Lipids No results for input(s): CHOL, TRIG, HDL, LABVLDL, LDLCALC, CHOLHDL  in the last 168 hours.  Hematology Recent Labs  Lab 05/24/21 0045 05/25/21 0151 05/26/21 0255  WBC 5.5 4.9 4.8  RBC 3.14* 3.01* 3.13*  HGB 9.6* 9.2* 9.5*  HCT 29.8* 28.2* 29.7*  MCV 94.9 93.7 94.9  MCH 30.6 30.6 30.4  MCHC 32.2 32.6 32.0  RDW 13.7 13.9 13.8  PLT 200 172 202   Thyroid  Recent Labs  Lab 05/23/21 2153  TSH 1.577    BNP Recent Labs  Lab 05/26/21 0255  BNP 45.5    DDimer No results for input(s): DDIMER in the last 168 hours.   Radiology    DG CHEST PORT 1 VIEW  Result Date: 05/25/2021 CLINICAL DATA:  Dyspnea EXAM: PORTABLE CHEST 1 VIEW COMPARISON:  Chest radiograph dated 05/23/2021. FINDINGS: The heart size and mediastinal contours are within normal limits. Vascular calcifications are seen in the aortic arch. Mild scarring/atelectasis is seen in the right mid lung and the left midlung. There is no pleural effusion or pneumothorax. Degenerative changes are seen in the spine. IMPRESSION: Mild scarring/atelectasis in the midlung on the right and left. Aortic Atherosclerosis (ICD10-I70.0). Electronically Signed   By: Zerita Boers M.D.   On: 05/25/2021 20:26    Cardiac Studies     Patient Profile     81 y.o. female with a hx of  lung cancer, COPD, OSA, CVA, nonobstructive CAD, heart failure with reduced ejection fraction who is being seen for the evaluation of bradycardia   Assessment & Plan    Bradycardia/presyncope: Heart rate 40-50s on  admission, has improved to 50s to 70s since holding metoprolol.  Continue to hold metoprolol.  -Plan Zio patch x 2 weeks on discharge to evaluate her presyncope. Continue to hold metoprolol   Chronic heart failure with reduced ejection fraction: EF 45 to 50% on echo 12/2020, echo this admission showing 50 to 55%.  ARB was held in setting of AKI on admission.  Toprol-XL held due to bradycardia.  Renal function improved.  Mildly hypervolemic on exam -Can restart ARB and lasix, continue to hold metoprolol   CVA-continue Plavix, statin   Hypertension-holding Toprol-XL, can restart ARB   Hyperlipidemia-continue statin.   [facility] will sign off.   Medication Recommendations: Hold metoprolol.  Continue Plavix, atorvastatin 40 mg daily, Lasix 20 mg daily, candesartan 8 mg daily Other recommendations (labs, testing, etc): BMET in 1 week.  Plan Zio x 2 weeks on discharge Follow up as an outpatient: Will schedule    For questions or updates, please contact [place] Please consult [email or web address] for contact info under        Signed, Donato Heinz, MD  05/26/2021, 10:59 AM

## 2021-05-26 NOTE — Evaluation (Signed)
Occupational Therapy Evaluation/Discharge Patient Details Name: Taylor Berry MRN: [id number] DOB: 1940/08/04 Today's Date: 05/26/2021   History of Present Illness Pt is an 81 y/o female presenting to [facility] due to progressive dizziness at home. Pt noted with AKI and bradycardia during admission. Cardiology recommended holding metoporol with improvements in symptoms. PMH: nonobstructive CAD, COPD, chronic anemia, hypertension, hyperlipidemia, peripheral polyneuropathy rheumatoid arthritis, chronic systolic/diastolic heart failure, CVA 12/22   Clinical Impression   PTA, pt lives alone, reports currently working with OP PT on balance/endurance and recently progressed away from RW use for mobility. Pt reports Modified Independence with ADLs, light IADLs and driving. Pt presents now with minor dynamic standing balance and endurance deficits though fairly close to baseline. Pt overall Modified Independent for UB ADLs, Setup Assist for LB ADLs, and progressed to Supervision for mobility using RW for light support. Pt reports mild dizziness with mobility, BP stable throughout. Educated re: fall prevention strategies, use of visual anchor when dizzy, and son's assist for initial stair mgmt into home at DC. Anticipate quick return to PLOF with resolution of dizziness. No further skilled OT services needed at acute level or DC. OT to sign off.      Recommendations for follow up therapy are one component of a multi-disciplinary discharge planning process, led by the attending physician.  Recommendations may be updated based on patient status, additional functional criteria and insurance authorization.   Follow Up Recommendations  No OT follow up    Assistance Recommended at Discharge PRN  Patient can return home with the following Assistance with cooking/housework;Assist for transportation;Help with stairs or ramp for entrance    Functional Status Assessment  Patient has had a recent decline in their  functional status and demonstrates the ability to make significant improvements in function in a reasonable and predictable amount of time.  Equipment Recommendations  None recommended by OT    Recommendations for Other Services       Precautions / Restrictions Precautions Precautions: Fall;Other (comment) Precaution Comments: monitor orthostatics Restrictions Weight Bearing Restrictions: No      Mobility Bed Mobility Overal bed mobility: Modified Independent                  Transfers Overall transfer level: Modified independent Equipment used: None               General transfer comment: able to stand without RW, benefits from RW use for mobility      Balance Overall balance assessment: Needs assistance Sitting-balance support: No upper extremity supported, Feet supported Sitting balance-Leahy Scale: Good     Standing balance support: Bilateral upper extremity supported, During functional activity Standing balance-Leahy Scale: Fair Standing balance comment: able to stand statically without UE support, benefits from RW use during mobility though lifting one UE up at times when talking without overt LOB                           ADL either performed or assessed with clinical judgement   ADL Overall ADL's : Needs assistance/impaired Eating/Feeding: Independent   Grooming: Set up;Standing   Upper Body Bathing: Modified independent;Sitting   Lower Body Bathing: Set up;Sit to/from stand   Upper Body Dressing : Modified independent;Sitting   Lower Body Dressing: Set up;Sit to/from stand   Toilet Transfer: Supervision/safety;Ambulation;Rolling walker (2 wheels)   Toileting- Clothing Manipulation and Hygiene: Set up;Sit to/from stand;Sitting/lateral lean         General ADL Comments:  Pt appears fairly close to baseline, discussed energy conservation strategies (especially violin mgmt, IADLs), use of visual anchor to ease dizziness      Vision Baseline Vision/History: 1 Wears glasses Ability to See in Adequate Light: 0 Adequate Patient Visual Report: No change from baseline Vision Assessment?: No apparent visual deficits     Perception     Praxis      Pertinent Vitals/Pain Pain Assessment Pain Assessment: No/denies pain     Hand Dominance Right   Extremity/Trunk Assessment Upper Extremity Assessment Upper Extremity Assessment: Overall WFL for tasks assessed (R 5th digit splinting; pt reports "dont ask, its not relevant")   Lower Extremity Assessment Lower Extremity Assessment: Defer to PT evaluation   Cervical / Trunk Assessment Cervical / Trunk Assessment: Kyphotic (mild, rounded shoulders)   Communication Communication Communication: No difficulties   Cognition Arousal/Alertness: Awake/alert Behavior During Therapy: WFL for tasks assessed/performed Overall Cognitive Status: Within Functional Limits for tasks assessed                                       General Comments  BP 140s/70s, HR WFL; mild dizziness reported    Exercises     Shoulder Instructions      Home Living Family/patient expects to be discharged to:: Private residence Living Arrangements: Alone Available Help at Discharge: Family;Available PRN/intermittently Type of Home: Other(Comment) (condo) Home Access: Stairs to enter Entrance Stairs-Number of Steps: 3 Entrance Stairs-Rails: Right Home Layout: One level     Bathroom Shower/Tub: Occupational psychologist: Standard     Home Equipment: Rollator (4 wheels);Shower seat - built in;Grab bars - tub/shower;Hand held Engineering geologist (2 wheels)          Prior Functioning/Environment Prior Level of Function : Independent/Modified Independent;Driving             Mobility Comments: currently working with OP PT focusing on balance and endurance, had progressed away from RW use since CVA in Dec ADLs Comments: Mod I for ADLs, basic  IADLs in the home though reports heavy cleaning tasks too difficult to complete at this time. Drives, grocery shops and recently began playing violin in [place] again        OT Problem List: Impaired balance (sitting and/or standing);Decreased activity tolerance      OT Treatment/Interventions:      OT Goals(Current goals can be found in the care plan section) Acute Rehab OT Goals Patient Stated Goal: get away from RW use OT Goal Formulation: All assessment and education complete, DC therapy  OT Frequency:      Co-evaluation              AM-PAC OT "6 Clicks" Daily Activity     Outcome Measure Help from another person eating meals?: None Help from another person taking care of personal grooming?: None Help from another person toileting, which includes using toliet, bedpan, or urinal?: A Little Help from another person bathing (including washing, rinsing, drying)?: A Little Help from another person to put on and taking off regular upper body clothing?: None Help from another person to put on and taking off regular lower body clothing?: A Little 6 Click Score: 21   End of Session Equipment Utilized During Treatment: Rolling walker (2 wheels) Nurse Communication: Mobility status  Activity Tolerance: Patient tolerated treatment well Patient left:  (to complete stair training with PT)  OT Visit Diagnosis: Dizziness  and giddiness (R42)                Time: 4210-3128 OT Time Calculation (min): 25 min Charges:  OT General Charges $OT Visit: 1 Visit OT Evaluation $OT Eval Low Complexity: 1 Low  Malachy Chamber, OTR/L Acute Rehab Services Office: [phone]   Layla Maw 05/26/2021, 9:14 AM

## 2021-05-26 NOTE — Evaluation (Signed)
Physical Therapy Evaluation Patient Details Name: Taylor Berry MRN: [id number] DOB: 1940-05-05 Today's Date: 05/26/2021  History of Present Illness  Pt is an 81 y/o female presenting to [facility] 05/23/2021 due to progressive dizziness at home. Pt noted with AKI and bradycardia during admission. Cardiology recommended holding metoporol with improvements in symptoms. PMH: nonobstructive CAD, COPD, chronic anemia, hypertension, hyperlipidemia, peripheral polyneuropathy rheumatoid arthritis, chronic systolic/diastolic heart failure, CVA 12/22  Clinical Impression  Pt presents to PT with deficits in strength, power, gait, balance, endurance, however she does not appear far from her recent baseline after CVA. Pt is able to ambulate and negotiate stairs with UE support of walker without physical assistance. Pt tolerates mobility well with report of mild dizziness upon PT arrival, but no further reports during mobility. BP at end of ambulation stable. Pt will benefit from continued acute PT services in an effort to improve balance and progress toward pt goals of independent mobility without an assistive device. PT recommends continued outpatient PT at the time of discharge.     Recommendations for follow up therapy are one component of a multi-disciplinary discharge planning process, led by the attending physician.  Recommendations may be updated based on patient status, additional functional criteria and insurance authorization.  Follow Up Recommendations Outpatient PT (continue outpatient PT)    Assistance Recommended at Discharge PRN  Patient can return home with the following  A little help with bathing/dressing/bathroom;Assist for transportation    Equipment Recommendations None recommended by PT  Recommendations for Other Services       Functional Status Assessment Patient has had a recent decline in their functional status and demonstrates the ability to make significant improvements in function  in a reasonable and predictable amount of time.     Precautions / Restrictions Precautions Precautions: Fall;Other (comment) Precaution Comments: monitor orthostatics Restrictions Weight Bearing Restrictions: No      Mobility  Bed Mobility                    Transfers                   General transfer comment: pt received in hallway, ambulating with OT    Ambulation/Gait Ambulation/Gait assistance: Supervision Gait Distance (Feet): 150 Feet Assistive device: Rolling walker (2 wheels) Gait Pattern/deviations: Step-through pattern Gait velocity: reduced Gait velocity interpretation: <1.8 ft/sec, indicate of risk for recurrent falls   General Gait Details: pt with slowed step-through gait utilizing RW, no significant balance deviations noted, increased trunk flexion  Stairs Stairs: Yes Stairs assistance: Supervision Stair Management: One rail Left, Step to pattern, Sideways Number of Stairs: 3    Wheelchair Mobility    Modified Rankin (Stroke Patients Only)       Balance Overall balance assessment: Needs assistance Sitting-balance support: Feet supported, No upper extremity supported Sitting balance-Leahy Scale: Good     Standing balance support: Bilateral upper extremity supported, Reliant on assistive device for balance Standing balance-Leahy Scale: Poor                               Pertinent Vitals/Pain Pain Assessment Pain Assessment: No/denies pain    Home Living Family/patient expects to be discharged to:: Private residence Living Arrangements: Alone Available Help at Discharge: Family;Available PRN/intermittently Type of Home: Other(Comment) (condo) Home Access: Stairs to enter Entrance Stairs-Rails: Right Entrance Stairs-Number of Steps: 3   Home Layout: One level Home Equipment: Rollator (4 wheels);Shower seat -  built in;Grab bars - tub/shower;Hand held Engineering geologist (2 wheels)      Prior Function  Prior Level of Function : Independent/Modified Independent;Driving             Mobility Comments: currently working with OP PT focusing on balance and endurance, had progressed away from RW use since CVA in Dec ADLs Comments: Mod I for ADLs, basic IADLs in the home though reports heavy cleaning tasks too difficult to complete at this time. Drives, grocery shops and recently began playing violin in [place] again     Hand Dominance   Dominant Hand: Right    Extremity/Trunk Assessment   Upper Extremity Assessment Upper Extremity Assessment: Defer to OT evaluation    Lower Extremity Assessment Lower Extremity Assessment: Generalized weakness (LLE more significant than RLE)    Cervical / Trunk Assessment Cervical / Trunk Assessment: Kyphotic  Communication   Communication: No difficulties  Cognition Arousal/Alertness: Awake/alert Behavior During Therapy: WFL for tasks assessed/performed Overall Cognitive Status: Within Functional Limits for tasks assessed                                          General Comments General comments (skin integrity, edema, etc.): BP 138/83 post-mobility, pt reporting mild dizziness upon PT arrival but this resolves quickly, no other symptoms reporting during mobility. Brief vestibular assessment includes: pursuits saccades and vertical VOR all WFL. horizontal VOR notable for corrective saccades, pt reports mild dizziness with exercise but resolution quickly upon cessation    Exercises     Assessment/Plan    PT Assessment Patient needs continued PT services  PT Problem List Decreased strength;Decreased activity tolerance;Decreased balance;Decreased mobility;Decreased knowledge of use of DME;Cardiopulmonary status limiting activity       PT Treatment Interventions DME instruction;Gait training;Stair training;Functional mobility training;Therapeutic activities;Therapeutic exercise;Balance training;Neuromuscular  re-education;Patient/family education    PT Goals (Current goals can be found in the Care Plan section)  Acute Rehab PT Goals Patient Stated Goal: to return to independent mobility PT Goal Formulation: With patient Time For Goal Achievement: 06/09/21 Potential to Achieve Goals: Fair Additional Goals Additional Goal #1: Pt will verbalize precautions to avoid potential pre-sycopal episodes, including increased time between position changes and LE exercise    Frequency Min 3X/week     Co-evaluation               AM-PAC PT "6 Clicks" Mobility  Outcome Measure Help needed turning from your back to your side while in a flat bed without using bedrails?: None Help needed moving from lying on your back to sitting on the side of a flat bed without using bedrails?: None Help needed moving to and from a bed to a chair (including a wheelchair)?: A Little Help needed standing up from a chair using your arms (e.g., wheelchair or bedside chair)?: A Little Help needed to walk in hospital room?: A Little Help needed climbing 3-5 steps with a railing? : A Little 6 Click Score: 20    End of Session   Activity Tolerance: Patient tolerated treatment well Patient left: in chair;with call bell/phone within reach Nurse Communication: Mobility status PT Visit Diagnosis: Other abnormalities of gait and mobility (R26.89);Muscle weakness (generalized) (M62.81)    Time: 7035-0093 PT Time Calculation (min) (ACUTE ONLY): 23 min   Charges:   PT Evaluation $PT Eval Low Complexity: 1 Low  Zenaida Niece, PT, DPT Acute Rehabilitation Pager: [phone] Office [phone]   Zenaida Niece 05/26/2021, 9:47 AM

## 2021-05-26 NOTE — Discharge Summary (Signed)
Physician Discharge Summary  Taylor Berry LOV:[id number] DOB: November 24, 1940 DOA: 05/23/2021  PCP: Kelton Pillar, MD  Admit date: 05/23/2021 Discharge date: 05/26/2021  Admitted From: Home Disposition: Home  Recommendations for Outpatient Follow-up:  Follow up with PCP in 1-2 weeks Follow-up with cardiology, Dr. Stanford Breed, as scheduled  Home Health: None Equipment/Devices: None  Discharge Condition: Stable CODE STATUS: Full Diet recommendation: Low-salt low-fat diet  Brief/Interim Summary: Taylor Berry is a 81 y.o. female with medical history significant for nonobstructive CAD, COPD, chronic anemia with baseline hemoglobin 10-12, hypertension, hyperlipidemia, peripheral polyneuropathy rheumatoid arthritis, chronic systolic/diastolic heart failure, who is admitted to [facility] on 05/23/2021 with dizziness after presenting from home to [facility] ED.  Patient's symptomatic bradycardia improved drastically after cessation of her home metoprolol.  Cardiology was asked to evaluate per patient request due to ongoing symptomatology despite resolution of bradycardia.  Today patient symptoms have drastically resolved, no longer having any further episodes or issues of shortness of breath dyspnea exertion lightheadedness dizziness or fatigue.  Recommend close follow-up with PCP in next 1 to 2 weeks as scheduled.  Cardiology has requested heart monitor to be placed on patient at time of discharge, device will be mailed to patient's house at this time.  Patient otherwise stable and agreeable for discharge home.  Discharge Diagnoses:  Principal Problem:   Postural dizziness with presyncope Active Problems:   Pure hypercholesterolemia   Chronic disease anemia   Essential (primary) hypertension   COPD (chronic obstructive pulmonary disease) (HCC)   Non-ischemic cardiomyopathy/diastolic and systolic CHF   GERD (gastroesophageal reflux disease)   Bradycardia   Acute prerenal azotemia   Chronic  systolic heart failure [facility])    Discharge Instructions  Discharge Instructions     Discharge patient   Complete by: As directed    Discharge disposition: 01-Home or Self Care   Discharge patient date: 05/26/2021      Allergies as of 05/26/2021       Reactions   Latex Itching, Rash   Ace Inhibitors Cough   Angiotensin Receptor Blockers Swelling, Other (See Comments)   Tongue swelling   Fentanyl Other (See Comments)   Hallucinations "I go crazy"     Hydrochlorothiazide Other (See Comments)   Decreased Sodium   Hydroxychloroquine Other (See Comments)   "Decreased sodium"   Penicillins Other (See Comments)   Yeast infections Has patient had a PCN reaction causing immediate rash, facial/tongue/throat swelling, SOB or lightheadedness with hypotension:No Has patient had a PCN reaction causing severe rash involving mucus membranes or skin necrosis: No Has patient had a PCN reaction that required hospitalization No Has patient had a PCN reaction occurring within the last 10 years: Yes If all of the above answers are "NO", then may proceed with Cephalosporin use. Other reaction(s): yeast infection        Medication List     STOP taking these medications    metoprolol succinate 25 MG 24 hr tablet Commonly known as: TOPROL-XL       TAKE these medications    albuterol 108 (90 Base) MCG/ACT inhaler Commonly known as: VENTOLIN HFA INHALE 2 PUFFS BY MOUTH IN EACH NOSTRIL EVERY 4 TO 6 HOURS AS NEEDED What changed:  how much to take how to take this when to take this   atorvastatin 40 MG tablet Commonly known as: LIPITOR Take 1 tablet (40 mg total) by mouth daily.   CALCIUM-VITAMIN D PO Take 1 tablet by mouth daily.   candesartan 8 MG tablet Commonly  known as: ATACAND TAKE 1 TABLET(8 MG) BY MOUTH DAILY   cholestyramine 4 g packet Commonly known as: Questran Take 1 packet (4 g total) by mouth 2 (two) times daily.   ciclopirox 8 % solution Commonly known as:  PENLAC Apply 1 application. topically at bedtime.   clopidogrel 75 MG tablet Commonly known as: PLAVIX Take 1 tablet (75 mg total) by mouth daily.   famotidine 40 MG tablet Commonly known as: PEPCID Take 40 mg by mouth every evening.   Fluticasone-Umeclidin-Vilant 100-62.5-25 MCG/ACT Aepb Commonly known as: Trelegy Ellipta Inhale 1 puff into the lungs daily.   furosemide 20 MG tablet Commonly known as: LASIX Take 1 tablet (20 mg total) by mouth daily.   leflunomide 20 MG tablet Commonly known as: ARAVA Take 20 mg by mouth daily.   METAMUCIL FIBER PO Take by mouth See admin instructions. Mix 1 tablespoonful of powder into 4-8 ounces of desired beverage and drink once a day   vitamin B-12 1000 MCG tablet Commonly known as: CYANOCOBALAMIN Take 1 tablet (1,000 mcg total) by mouth daily.        Follow-up Information     Taylor Pelton, NP Follow up on 06/10/2021.   Specialty: Cardiology Why: at 10am for your follow up appt with Dr. Lonia Skinner' NP [place] information: [address] [place] [address] [phone]                Allergies  Allergen Reactions   Latex Itching and Rash   Ace Inhibitors Cough   Angiotensin Receptor Blockers Swelling and Other (See Comments)    Tongue swelling   Fentanyl Other (See Comments)    Hallucinations "I go crazy"      Hydrochlorothiazide Other (See Comments)    Decreased Sodium   Hydroxychloroquine Other (See Comments)    "Decreased sodium"   Penicillins Other (See Comments)    Yeast infections Has patient had a PCN reaction causing immediate rash, facial/tongue/throat swelling, SOB or lightheadedness with hypotension:No Has patient had a PCN reaction causing severe rash involving mucus membranes or skin necrosis: No Has patient had a PCN reaction that required hospitalization No Has patient had a PCN reaction occurring within the last 10 years: Yes If all of the above answers are "NO", then may  proceed with Cephalosporin use.  Other reaction(s): yeast infection    Consultations: Cardiology  Procedures/Studies: CT Head Wo Contrast  Result Date: 05/23/2021 CLINICAL DATA:  Acute neuro deficit.  Dizziness EXAM: CT HEAD WITHOUT CONTRAST TECHNIQUE: Contiguous axial images were obtained from the base of the skull through the vertex without intravenous contrast. RADIATION DOSE REDUCTION: This exam was performed according to the departmental dose-optimization program which includes automated exposure control, adjustment of the mA and/or kV according to patient size and/or use of iterative reconstruction technique. COMPARISON:  CT head 12/30/2020 FINDINGS: Brain: Generalized atrophy. Moderate white matter hypodensity bilaterally appears chronic and unchanged Negative for acute infarct, hemorrhage, mass Vascular: Negative for hyperdense vessel Skull: Negative Sinuses/Orbits: Mild mucosal edema right maxillary sinus. Remaining sinuses clear. No orbital lesion. Other: None IMPRESSION: No acute abnormality. Atrophy and chronic microvascular ischemia in the white matter, stable from the prior study. Electronically Signed   By: Franchot Gallo M.D.   On: 05/23/2021 15:13   MR BRAIN WO CONTRAST  Result Date: 05/23/2021 CLINICAL DATA:  Acute neuro deficit.  Facial droop EXAM: MRI HEAD WITHOUT CONTRAST TECHNIQUE: Multiplanar, multiecho pulse sequences of the brain and surrounding structures were obtained without intravenous  contrast. COMPARISON:  CT head 05/23/2021 FINDINGS: Brain: Negative for acute infarct. Generalized atrophy. Chronic microvascular ischemic change in the white matter and pons. Negative for hydrocephalus. Negative for hemorrhage or mass Vascular: Normal arterial flow voids Skull and upper cervical spine: Negative Sinuses/Orbits: Paranasal sinuses clear. Left mastoid effusion. Bilateral cataract extraction Other: None IMPRESSION: Negative for acute infarct. Moderate chronic microvascular  ischemic change in the white matter and pons. Generalized atrophy. Electronically Signed   By: Franchot Gallo M.D.   On: 05/23/2021 17:55   DG CHEST PORT 1 VIEW  Result Date: 05/25/2021 CLINICAL DATA:  Dyspnea EXAM: PORTABLE CHEST 1 VIEW COMPARISON:  Chest radiograph dated 05/23/2021. FINDINGS: The heart size and mediastinal contours are within normal limits. Vascular calcifications are seen in the aortic arch. Mild scarring/atelectasis is seen in the right mid lung and the left midlung. There is no pleural effusion or pneumothorax. Degenerative changes are seen in the spine. IMPRESSION: Mild scarring/atelectasis in the midlung on the right and left. Aortic Atherosclerosis (ICD10-I70.0). Electronically Signed   By: Zerita Boers M.D.   On: 05/25/2021 20:26   DG Chest Port 1 View  Result Date: 05/23/2021 CLINICAL DATA:  Bradycardia EXAM: PORTABLE CHEST 1 VIEW COMPARISON:  12/30/2020 FINDINGS: Cardiac shadow is mildly prominent but stable. Aortic calcifications are seen. The lungs are well aerated bilaterally. Mild right basilar atelectasis is seen. Stable scarring is noted the left mid as postsurgical changes. No acute abnormality noted. IMPRESSION: Right basilar atelectasis. Electronically Signed   By: Inez Catalina M.D.   On: 05/23/2021 21:46   ECHOCARDIOGRAM COMPLETE  Result Date: 05/24/2021    ECHOCARDIOGRAM REPORT   Patient Name:   GENIECE AKERS Date of Exam: 05/24/2021 Medical Rec #:  [id number]        Height:       62.0 in Accession #:    [id number]       Weight:       161.8 lb Date of Birth:  [birth year]        BSA:          1.747 m Patient Age:    8 years         BP:           125/50 mmHg Patient Gender: F                HR:           59 bpm. Exam Location:  Inpatient Procedure: 2D Echo, Cardiac Doppler and Color Doppler Indications:    Syncope  History:        Patient has prior history of Echocardiogram examinations, most                 recent 11/02/2020. Cardiomyopathy and CHF, Pulmonary HTN  and                 COPD; Risk Factors:Sleep Apnea and Hypertension.  Sonographer:    Clayton Lefort RDCS (AE) Referring Phys: [id number] [place]  1. Distal septal / apical hypokinesis . Left ventricular ejection fraction, by estimation, is 50 to 55%. The left ventricle has low normal function. The left ventricle has no regional wall motion abnormalities. There is mild left ventricular hypertrophy. Left ventricular diastolic parameters were normal.  2. Right ventricular systolic function is normal. The right ventricular size is normal. There is normal pulmonary artery systolic pressure.  3. Left atrial size was mildly dilated.  4. The mitral valve is abnormal. Trivial mitral valve  regurgitation. No evidence of mitral stenosis.  5. The aortic valve is tricuspid. There is mild calcification of the aortic valve. There is mild thickening of the aortic valve. Aortic valve regurgitation is trivial. Aortic valve sclerosis/calcification is present, without any evidence of aortic stenosis.  6. The inferior vena cava is normal in size with greater than 50% respiratory variability, suggesting right atrial pressure of 3 mmHg. FINDINGS  Left Ventricle: Distal septal / apical hypokinesis. Left ventricular ejection fraction, by estimation, is 50 to 55%. The left ventricle has low normal function. The left ventricle has no regional wall motion abnormalities. The left ventricular internal cavity size was normal in size. There is mild left ventricular hypertrophy. Left ventricular diastolic parameters were normal. Right Ventricle: The right ventricular size is normal. No increase in right ventricular wall thickness. Right ventricular systolic function is normal. There is normal pulmonary artery systolic pressure. The tricuspid regurgitant velocity is 2.62 m/s, and  with an assumed right atrial pressure of 8 mmHg, the estimated right ventricular systolic pressure is 16.1 mmHg. Left Atrium: Left atrial size was mildly  dilated. Right Atrium: Right atrial size was normal in size. Pericardium: There is no evidence of pericardial effusion. Mitral Valve: The mitral valve is abnormal. There is mild thickening of the mitral valve leaflet(s). There is mild calcification of the mitral valve leaflet(s). Trivial mitral valve regurgitation. No evidence of mitral valve stenosis. MV peak gradient, 6.1 mmHg. The mean mitral valve gradient is 2.0 mmHg. Tricuspid Valve: The tricuspid valve is normal in structure. Tricuspid valve regurgitation is mild . No evidence of tricuspid stenosis. Aortic Valve: The aortic valve is tricuspid. There is mild calcification of the aortic valve. There is mild thickening of the aortic valve. Aortic valve regurgitation is trivial. Aortic valve sclerosis/calcification is present, without any evidence of aortic stenosis. Aortic valve mean gradient measures 2.0 mmHg. Aortic valve peak gradient measures 4.5 mmHg. Aortic valve area, by VTI measures 3.65 cm. Pulmonic Valve: The pulmonic valve was normal in structure. Pulmonic valve regurgitation is trivial. No evidence of pulmonic stenosis. Aorta: The aortic root is normal in size and structure. Venous: The inferior vena cava is normal in size with greater than 50% respiratory variability, suggesting right atrial pressure of 3 mmHg. IAS/Shunts: No atrial level shunt detected by color flow Doppler.  LEFT VENTRICLE PLAX 2D LVIDd:         5.00 cm   Diastology LVIDs:         3.40 cm   LV e' medial:    7.07 cm/s LV PW:         1.10 cm   LV E/e' medial:  13.6 LV IVS:        1.20 cm   LV e' lateral:   11.50 cm/s LVOT diam:     2.20 cm   LV E/e' lateral: 8.3 LV SV:         82 LV SV Index:   47 LVOT Area:     3.80 cm  RIGHT VENTRICLE             IVC RV Basal diam:  2.60 cm     IVC diam: 1.80 cm RV S prime:     15.00 cm/s TAPSE (M-mode): 2.5 cm LEFT ATRIUM             Index        RIGHT ATRIUM           Index LA diam:  4.00 cm 2.29 cm/m   RA Area:     16.00 cm LA Vol  (A2C):   88.9 ml 50.88 ml/m  RA Volume:   37.20 ml  21.29 ml/m LA Vol (A4C):   68.2 ml 39.04 ml/m LA Biplane Vol: 80.7 ml 46.19 ml/m  AORTIC VALVE AV Area (Vmax):    3.36 cm AV Area (Vmean):   3.41 cm AV Area (VTI):     3.65 cm AV Vmax:           106.00 cm/s AV Vmean:          67.700 cm/s AV VTI:            0.226 m AV Peak Grad:      4.5 mmHg AV Mean Grad:      2.0 mmHg LVOT Vmax:         93.80 cm/s LVOT Vmean:        60.800 cm/s LVOT VTI:          0.217 m LVOT/AV VTI ratio: 0.96  AORTA Ao Root diam: 3.30 cm Ao Asc diam:  3.40 cm MITRAL VALVE                TRICUSPID VALVE MV Area (PHT): 2.62 cm     TR Peak grad:   27.5 mmHg MV Area VTI:   1.86 cm     TR Vmax:        262.00 cm/s MV Peak grad:  6.1 mmHg MV Mean grad:  2.0 mmHg     SHUNTS MV Vmax:       1.23 m/s     Systemic VTI:  0.22 m MV Vmean:      64.5 cm/s    Systemic Diam: 2.20 cm MV Decel Time: 290 msec MV E velocity: 95.90 cm/s MV A velocity: 112.00 cm/s MV E/A ratio:  0.86 Jenkins Rouge MD Electronically signed by Jenkins Rouge MD Signature Date/Time: 05/24/2021/5:44:33 PM    Final      Subjective: No acute issues or events overnight   Discharge Exam: Vitals:   05/26/21 0753 05/26/21 1220  BP: (!) 142/77   Pulse: 97 86  Resp: 20   Temp: 97.8 F (36.6 C) 98.6 F (37 C)  SpO2: 98% 95%   Vitals:   05/26/21 0448 05/26/21 0455 05/26/21 0753 05/26/21 1220  BP:   (!) 142/77   Pulse:   97 86  Resp:   20   Temp: 97.8 F (36.6 C)  97.8 F (36.6 C) 98.6 F (37 C)  TempSrc: Oral  Oral Oral  SpO2:   98% 95%  Weight:  74.2 kg    Height:        General: Pt is alert, awake, not in acute distress Cardiovascular: RRR, S1/S2 +, no rubs, no gallops Respiratory: CTA bilaterally, no wheezing, no rhonchi Abdominal: Soft, NT, ND, bowel sounds + Extremities: no edema, no cyanosis    The results of significant diagnostics from this hospitalization (including imaging, microbiology, ancillary and laboratory) are listed below for reference.      Microbiology: No results found for this or any previous visit (from the past 240 hour(s)).   Labs: BNP (last 3 results) Recent Labs    05/26/21 0255  BNP 95.6   Basic Metabolic Panel: Recent Labs  Lab 05/23/21 1400 05/23/21 2153 05/24/21 0045 05/25/21 0151 05/26/21 0255  NA 135  --  138 138 137  K 4.6  --  3.7 4.0 4.0  CL 102  --  107 110 110  CO2 24  --  23 23 22   GLUCOSE 105*  --  111* 105* 95  BUN 20  --  20 20 17   CREATININE 0.88  --  1.24* 0.85 0.81  CALCIUM 9.4  --  8.9 8.9 9.0  MG  --  2.0 1.9  --   --    Liver Function Tests: Recent Labs  Lab 05/23/21 1400 05/24/21 0045  AST 22 19  ALT 11 9  ALKPHOS 39 31*  BILITOT 0.6 0.5  PROT 6.7 5.8*  ALBUMIN 3.6 3.0*   No results for input(s): LIPASE, AMYLASE in the last 168 hours. No results for input(s): AMMONIA in the last 168 hours. CBC: Recent Labs  Lab 05/23/21 1400 05/24/21 0045 05/25/21 0151 05/26/21 0255  WBC 7.1 5.5 4.9 4.8  NEUTROABS  --  3.7  --   --   HGB 10.6* 9.6* 9.2* 9.5*  HCT 32.6* 29.8* 28.2* 29.7*  MCV 94.2 94.9 93.7 94.9  PLT 199 200 172 202   Cardiac Enzymes: No results for input(s): CKTOTAL, CKMB, CKMBINDEX, TROPONINI in the last 168 hours. BNP: Invalid input(s): POCBNP CBG: No results for input(s): GLUCAP in the last 168 hours. D-Dimer No results for input(s): DDIMER in the last 72 hours. Hgb A1c No results for input(s): HGBA1C in the last 72 hours. Lipid Profile No results for input(s): CHOL, HDL, LDLCALC, TRIG, CHOLHDL, LDLDIRECT in the last 72 hours. Thyroid function studies Recent Labs    05/23/21 2153  TSH 1.577   Anemia work up No results for input(s): VITAMINB12, FOLATE, FERRITIN, TIBC, IRON, RETICCTPCT in the last 72 hours. Urinalysis    Component Value Date/Time   COLORURINE STRAW (A) 05/23/2021 1535   APPEARANCEUR CLEAR 05/23/2021 1535   LABSPEC 1.006 05/23/2021 1535   PHURINE 5.0 05/23/2021 1535   GLUCOSEU NEGATIVE 05/23/2021 1535   HGBUR NEGATIVE  05/23/2021 1535   BILIRUBINUR NEGATIVE 05/23/2021 1535   KETONESUR NEGATIVE 05/23/2021 1535   PROTEINUR NEGATIVE 05/23/2021 1535   UROBILINOGEN 0.2 04/11/2013 1459   NITRITE NEGATIVE 05/23/2021 1535   LEUKOCYTESUR NEGATIVE 05/23/2021 1535   Sepsis Labs Invalid input(s): PROCALCITONIN,  WBC,  LACTICIDVEN Microbiology No results found for this or any previous visit (from the past 240 hour(s)).   Time coordinating discharge: Over 30 minutes  SIGNED:   Little Ishikawa, DO [facility] 05/26/2021, 6:07 PM Pager   If 7PM-7AM, please contact night-coverage [email or web address]

## 2021-05-27 ENCOUNTER — Ambulatory Visit (INDEPENDENT_AMBULATORY_CARE_PROVIDER_SITE_OTHER): Payer: HMO

## 2021-05-27 DIAGNOSIS — R001 Bradycardia, unspecified: Secondary | ICD-10-CM

## 2021-05-27 LAB — CALCIUM, IONIZED: Calcium, Ionized, Serum: 5.3 mg/dL (ref 4.5–5.6)

## 2021-05-27 NOTE — Progress Notes (Unsigned)
Enrolled for [employer] to mail a ZIO XT long term holter monitor to the patients address on file.   Dr. Crenshaw to read. 

## 2021-05-28 ENCOUNTER — Ambulatory Visit: Payer: HMO | Admitting: Allergy and Immunology

## 2021-05-28 ENCOUNTER — Encounter: Payer: Self-pay | Admitting: Allergy and Immunology

## 2021-05-28 VITALS — BP 122/82 | HR 89 | Temp 97.4°F | Resp 16 | Ht 62.0 in | Wt 163.2 lb

## 2021-05-28 DIAGNOSIS — J449 Chronic obstructive pulmonary disease, unspecified: Secondary | ICD-10-CM

## 2021-05-28 DIAGNOSIS — J3089 Other allergic rhinitis: Secondary | ICD-10-CM

## 2021-05-28 DIAGNOSIS — K219 Gastro-esophageal reflux disease without esophagitis: Secondary | ICD-10-CM | POA: Diagnosis not present

## 2021-05-28 DIAGNOSIS — J4489 Other specified chronic obstructive pulmonary disease: Secondary | ICD-10-CM

## 2021-05-28 MED ORDER — FLUTICASONE PROPIONATE 50 MCG/ACT NA SUSP
2.0000 | Freq: Every day | NASAL | 5 refills | Status: DC
  Filled 2022-02-11 – 2022-03-03 (×2): qty 16, 30d supply, fill #0

## 2021-05-28 MED ORDER — ALBUTEROL SULFATE HFA 108 (90 BASE) MCG/ACT IN AERS: 2.0000 | INHALATION_SPRAY | RESPIRATORY_TRACT | 1 refills | Status: DC | PRN

## 2021-05-28 MED ORDER — FAMOTIDINE 40 MG PO TABS: 40.0000 mg | ORAL_TABLET | Freq: Every evening | ORAL | 5 refills | Status: DC

## 2021-05-28 MED ORDER — FLUTICASONE-UMECLIDIN-VILANT 100-62.5-25 MCG/ACT IN AEPB: 1.0000 | INHALATION_SPRAY | Freq: Every day | RESPIRATORY_TRACT | 5 refills | Status: DC

## 2021-05-28 NOTE — Progress Notes (Signed)
[facility]   Follow-up Note  Referring Provider: Kelton Pillar, MD Primary Provider: Kelton Pillar, MD Date of Office Visit: 05/28/2021  Subjective:   Taylor Berry (DOB: 07-31-1940) is a 81 y.o. female who returns to the [facility] [place] on 05/28/2021 in re-evaluation of the following:  HPI: Tonilynn presents to this clinic in evaluation of COPD/asthma overlap, allergic rhinitis, LPR, history of diarrhea, history of dry mouth.  I last saw in this clinic on 01 May 2020.  Since I have seen her in this clinic she apparently had a cerebrovascular accident on Christmas Day 2022 without any long-term sequela and then recently she had an episode of bradycardia/dizziness requiring hospitalization and she is working through this issue with her cardiologist.  Her respiratory tract has really been doing quite well and she rarely uses a short acting bronchodilator.  She is short of breath if she exerts herself but at rest she is doing quite well.  She has not required a systemic steroid or an antibiotic for any type of airway issue.  She had very little issues with her nose.  Her reflux is doing well with just using famotidine at this point in time.  Her diarrhea is doing quite well while using cholestyramine.  Her insurance company would not fill colestipol and she is using powder cholestyramine for her diarrhea.  Allergies as of 05/28/2021       Reactions   Latex Itching, Rash   Metoprolol Tartrate Other (See Comments), Shortness Of Breath   Ace Inhibitors Cough   Angiotensin Receptor Blockers Swelling, Other (See Comments)   Tongue swelling   Fentanyl Other (See Comments)   Hallucinations "I go crazy"     Hydrochlorothiazide Other (See Comments)   Decreased Sodium   Hydroxychloroquine Other (See Comments)   "Decreased sodium"   Penicillins Other (See Comments)   Yeast infections Has patient had a PCN reaction causing  immediate rash, facial/tongue/throat swelling, SOB or lightheadedness with hypotension:No Has patient had a PCN reaction causing severe rash involving mucus membranes or skin necrosis: No Has patient had a PCN reaction that required hospitalization No Has patient had a PCN reaction occurring within the last 10 years: Yes If all of the above answers are "NO", then may proceed with Cephalosporin use. Other reaction(s): yeast infection        Medication List    albuterol 108 (90 Base) MCG/ACT inhaler Commonly known as: VENTOLIN HFA INHALE 2 PUFFS BY MOUTH IN EACH NOSTRIL EVERY 4 TO 6 HOURS AS NEEDED   atorvastatin 40 MG tablet Commonly known as: LIPITOR Take 1 tablet by mouth daily.   CALCIUM-VITAMIN D PO Take 1 tablet by mouth daily.   candesartan 8 MG tablet Commonly known as: ATACAND TAKE 1 TABLET(8 MG) BY MOUTH DAILY   cholestyramine 4 g packet Commonly known as: Questran Take 1 packet (4 g total) by mouth 2 (two) times daily.   ciclopirox 8 % solution Commonly known as: PENLAC Apply 1 application. topically at bedtime.   clopidogrel 75 MG tablet Commonly known as: PLAVIX Take 1 tablet (75 mg total) by mouth daily.   famotidine 40 MG tablet Commonly known as: PEPCID Take 40 mg by mouth every evening.   Fluticasone-Umeclidin-Vilant 100-62.5-25 MCG/ACT Aepb Commonly known as: Trelegy Ellipta Inhale 1 puff into the lungs daily.   furosemide 20 MG tablet Commonly known as: LASIX Take 1 tablet (20 mg total) by mouth daily.   leflunomide  20 MG tablet Commonly known as: ARAVA Take 20 mg by mouth daily.   METAMUCIL FIBER PO Take by mouth See admin instructions. Mix 1 tablespoonful of powder into 4-8 ounces of desired beverage and drink once a day   Vitamin B12 1000 MCG Tbcr Take 1 tablet by mouth daily.    Past Medical History:  Diagnosis Date   Abnormal CT scan    Allergic rhinitis    Anemia 2015   after lung surgery   Angio-edema    Complication of  anesthesia    Fentanyl allergy, "claustrophobia"   COPD (chronic obstructive pulmonary disease) ([place])    Depression    Diaphoresis 07/30/2015   Excessive sweating with little exertion   Difficult intravenous access    GERD (gastroesophageal reflux disease)    In the past   Hyperlipidemia    Hypertension    Left bundle branch block    Lung cancer ([place]) 2015   left upper lobe wedge removed-no further tx.   Neuropathy    Tingling in arms, fingers, and feet (Since 06/15/16)   Obesity    OSA (obstructive sleep apnea)    denies.   Osteoarthritis    ra also   Osteoporosis    Recurrent upper respiratory infection (URI)    Rheumatoid arthritis(714.0)    Dr. Amil Amen   SOB (shortness of breath) on exertion    Uterine cancer ([place]) 1971   tx surgical    Past Surgical History:  Procedure Laterality Date   ABDOMINAL HYSTERECTOMY     in situ carcinoma partial   CARDIAC CATHETERIZATION  01/27/11   minor non-obs CAD, NL EF, mild pulm HTN   CATARACT EXTRACTION, BILATERAL     last done 12'16-   COLONOSCOPY W/ BIOPSIES AND POLYPECTOMY     COLONOSCOPY WITH PROPOFOL N/A 02/13/2015   Procedure: COLONOSCOPY WITH PROPOFOL;  Surgeon: Laurence Spates, MD;  Location: [facility];  Service: Endoscopy;  Laterality: N/A;   ESOPHAGOGASTRODUODENOSCOPY (EGD) WITH PROPOFOL N/A 09/30/2016   Procedure: ESOPHAGOGASTRODUODENOSCOPY (EGD) WITH PROPOFOL;  Surgeon: Laurence Spates, MD;  Location: [facility];  Service: Endoscopy;  Laterality: N/A;   LYMPH NODE DISSECTION Left 04/12/2013   Procedure: LYMPH NODE DISSECTION;  Surgeon: [name], MD;  Location: [facility];  Service: Thoracic;  Laterality: Left;   RIGHT/LEFT HEART CATH AND CORONARY ANGIOGRAPHY N/A 10/08/2016   Procedure: RIGHT/LEFT HEART CATH AND CORONARY ANGIOGRAPHY;  Surgeon: Burnell Blanks, MD;  Location: [facility];  Service: Cardiovascular;  Laterality: N/A;   TONSILLECTOMY     VESICOVAGINAL FISTULA CLOSURE W/ TAH  1971   VIDEO  ASSISTED THORACOSCOPY (VATS)/THOROCOTOMY Left 04/12/2013   Procedure: VIDEO ASSISTED THORACOSCOPY (VATS)/THOROCOTOMY, WITH LEFT UPPER LOBE WEDGE RESECTION, CHEST WALL BIOPSY ;  Surgeon: [name], MD;  Location: [facility];  Service: Thoracic;  Laterality: Left;   VIDEO BRONCHOSCOPY N/A 04/12/2013   Procedure: VIDEO BRONCHOSCOPY;  Surgeon: [name], MD;  Location: [facility];  Service: Thoracic;  Laterality: N/A;    Review of systems negative except as noted in HPI / PMHx or noted below:  Review of Systems  Constitutional: Negative.   HENT: Negative.    Eyes: Negative.   Respiratory: Negative.    Cardiovascular: Negative.   Gastrointestinal: Negative.   Genitourinary: Negative.   Musculoskeletal: Negative.   Skin: Negative.   Neurological: Negative.   Endo/Heme/Allergies: Negative.   Psychiatric/Behavioral: Negative.      Objective:   Vitals:   05/28/21 1141  BP: 122/82  Pulse:  89  Resp: 16  Temp: (!) 97.4 F (36.3 C)  SpO2: 100%   Height: '5\' 2"'$  (157.5 cm)  Weight: 163 lb 3.2 oz (74 kg)   Physical Exam Constitutional:      Appearance: She is not diaphoretic.  HENT:     Head: Normocephalic.     Right Ear: Tympanic membrane, ear canal and external ear normal.     Left Ear: Tympanic membrane, ear canal and external ear normal.     Nose: Nose normal. No mucosal edema or rhinorrhea.     Mouth/Throat:     Pharynx: Uvula midline. No oropharyngeal exudate.  Eyes:     Conjunctiva/sclera: Conjunctivae normal.  Neck:     Thyroid: No thyromegaly.     Trachea: Trachea normal. No tracheal tenderness or tracheal deviation.  Cardiovascular:     Rate and Rhythm: Normal rate and regular rhythm.     Heart sounds: S1 normal and S2 normal. Murmur (Systolic) heard.  Pulmonary:     Effort: No respiratory distress.     Breath sounds: Normal breath sounds. No stridor. No wheezing or rales.  Lymphadenopathy:     Head:     Right side of head: No tonsillar adenopathy.     Left  side of head: No tonsillar adenopathy.     Cervical: No cervical adenopathy.  Skin:    Findings: No erythema or rash.     Nails: There is no clubbing.  Neurological:     Mental Status: She is alert.    Diagnostics:    Spirometry was performed and demonstrated an FEV1 of 0.88 at 49 % of predicted.  Results of blood tests obtained 01 May 2020 identifies negative ANA.  Assessment and Plan:   1. COPD with asthma ([place])   2. Perennial allergic rhinitis   3. LPRD (laryngopharyngeal reflux disease)     1.  Continue Famotidine '40mg'$  1 time per day in PM  2.  Continue flonase -1-2 sprays each nostril 1 time per day  3.  Continue Trelegy 100 - 1 inhalation 1 time per day.    4.  If needed:   A. Pro-air HFA 2 inhalations every 4-6 hours.   5.  Return to clinic in 6 months or earlier if problem   Ione overall is doing relatively well while using therapy directed against reflux and inflammation of both her upper and lower airway with the medications noted above and as long as she continues to do well with this plan I will see her back in this clinic in 6 months or earlier if there is a problem.  Allena Katz, MD [facility] / [facility]

## 2021-05-28 NOTE — Patient Instructions (Signed)
1.  Continue Famotidine 40mg  1 time per day in PM  2.  Continue flonase -1-2 sprays each nostril 1 time per day  3.  Continue Trelegy 100 - 1 inhalation 1 time per day.    4.  If needed:   A. Pro-air HFA 2 inhalations every 4-6 hours.   5.  Return to clinic in 6 months or earlier if problem

## 2021-05-29 ENCOUNTER — Encounter: Payer: Self-pay | Admitting: Allergy and Immunology

## 2021-05-30 DIAGNOSIS — R001 Bradycardia, unspecified: Secondary | ICD-10-CM | POA: Diagnosis not present

## 2021-06-01 ENCOUNTER — Encounter: Payer: Self-pay | Admitting: Allergy and Immunology

## 2021-06-04 NOTE — Progress Notes (Unsigned)
Cardiology Clinic Note   Patient Name: Taylor Berry Date of Encounter: 06/10/2021  Primary Care Provider:  Kelton Pillar, MD Primary Cardiologist:  Kirk Ruths, MD  Patient Profile    Taylor Berry 81 year old female presents to the clinic today for evaluation of her bradycardia.  Past Medical History    Past Medical History:  Diagnosis Date   Abnormal CT scan    Allergic rhinitis    Anemia 2015   after lung surgery   Angio-edema    Complication of anesthesia    Fentanyl allergy, "claustrophobia"   COPD (chronic obstructive pulmonary disease) ([place])    Depression    Diaphoresis 07/30/2015   Excessive sweating with little exertion   Difficult intravenous access    GERD (gastroesophageal reflux disease)    In the past   Hyperlipidemia    Hypertension    Left bundle branch block    Lung cancer ([place]) 2015   left upper lobe wedge removed-no further tx.   Neuropathy    Tingling in arms, fingers, and feet (Since 06/15/16)   Obesity    OSA (obstructive sleep apnea)    denies.   Osteoarthritis    ra also   Osteoporosis    Recurrent upper respiratory infection (URI)    Rheumatoid arthritis(714.0)    Dr. Amil Amen   SOB (shortness of breath) on exertion    Uterine cancer ([place]) 1971   tx surgical   Past Surgical History:  Procedure Laterality Date   ABDOMINAL HYSTERECTOMY     in situ carcinoma partial   CARDIAC CATHETERIZATION  01/27/11   minor non-obs CAD, NL EF, mild pulm HTN   CATARACT EXTRACTION, BILATERAL     last done 12'16-   COLONOSCOPY W/ BIOPSIES AND POLYPECTOMY     COLONOSCOPY WITH PROPOFOL N/A 02/13/2015   Procedure: COLONOSCOPY WITH PROPOFOL;  Surgeon: Laurence Spates, MD;  Location: [facility];  Service: Endoscopy;  Laterality: N/A;   ESOPHAGOGASTRODUODENOSCOPY (EGD) WITH PROPOFOL N/A 09/30/2016   Procedure: ESOPHAGOGASTRODUODENOSCOPY (EGD) WITH PROPOFOL;  Surgeon: Laurence Spates, MD;  Location: [facility];  Service: Endoscopy;  Laterality: N/A;    LYMPH NODE DISSECTION Left 04/12/2013   Procedure: LYMPH NODE DISSECTION;  Surgeon: [name], MD;  Location: [facility];  Service: Thoracic;  Laterality: Left;   RIGHT/LEFT HEART CATH AND CORONARY ANGIOGRAPHY N/A 10/08/2016   Procedure: RIGHT/LEFT HEART CATH AND CORONARY ANGIOGRAPHY;  Surgeon: Burnell Blanks, MD;  Location: [facility];  Service: Cardiovascular;  Laterality: N/A;   TONSILLECTOMY     VESICOVAGINAL FISTULA CLOSURE W/ TAH  1971   VIDEO ASSISTED THORACOSCOPY (VATS)/THOROCOTOMY Left 04/12/2013   Procedure: VIDEO ASSISTED THORACOSCOPY (VATS)/THOROCOTOMY, WITH LEFT UPPER LOBE WEDGE RESECTION, CHEST WALL BIOPSY ;  Surgeon: [name], MD;  Location: [facility];  Service: Thoracic;  Laterality: Left;   VIDEO BRONCHOSCOPY N/A 04/12/2013   Procedure: VIDEO BRONCHOSCOPY;  Surgeon: [name], MD;  Location: [facility];  Service: Thoracic;  Laterality: N/A;    Allergies  Allergies  Allergen Reactions   Latex Itching and Rash   Metoprolol Tartrate Other (See Comments) and Shortness Of Breath   Ace Inhibitors Cough   Angiotensin Receptor Blockers Swelling and Other (See Comments)    Tongue swelling   Fentanyl Other (See Comments)    Hallucinations "I go crazy"      Hydrochlorothiazide Other (See Comments)    Decreased Sodium   Hydroxychloroquine Other (See Comments)    "Decreased sodium"   Penicillins Other (See  Comments)    Yeast infections Has patient had a PCN reaction causing immediate rash, facial/tongue/throat swelling, SOB or lightheadedness with hypotension:No Has patient had a PCN reaction causing severe rash involving mucus membranes or skin necrosis: No Has patient had a PCN reaction that required hospitalization No Has patient had a PCN reaction occurring within the last 10 years: Yes If all of the above answers are "NO", then may proceed with Cephalosporin use.  Other reaction(s): yeast infection    History of Present Illness    Taylor C  Berry is a PMH of HTN, mild pulmonary hypertension, nonischemic cardiomyopathy/diastolic and systolic CHF, nonobstructive CAD, cerebral thrombosis with cerebral infarct, COPD, OSA, lung cancer and left upper lobe, chronic bronchitis, GERD, rheumatoid arthritis, hypercholesterolemia, obesity, and bradycardia.  She presented to the emergency department on 05/23/2021 with complaints of dizziness.  She was seen by cardiology.  Her symptoms substantially improved after cessation of home metoprolol.  She reported no further episodes of DOE, lightheadedness, dizziness or fatigue.  Cardiac event monitor was ordered and results have not yet posted.   She presents to the clinic today for follow-up evaluation states she has had a slight improvement in her dizziness over the last couple days.  However, with increased physical activity and activities such as bending down she notices dizziness.  She continues to wear her cardiac event monitor at this time and will finish her trial on Thursday.  I have reviewed her lab work from the hospital which is anemia.  She does not wish to follow-up with GI due to significant diagnostic testing previously.  We reviewed her most recent hemoglobin at 9.5 and I recommended increased iron supplementation in her diet.  Her and her son expressed understanding.  We will report results of her cardiac event monitor, repeat CBC today, and have her follow-up after her testing.  Today she denies chest pain, increased shortness of breath, increased lower extremity edema, fatigue, palpitations, melena, hematuria, hemoptysis, diaphoresis, weakness, presyncope, syncope, orthopnea, and PND.   Home Medications    Prior to Admission medications   Medication Sig Start Date End Date Taking? Authorizing Provider  albuterol (VENTOLIN HFA) 108 (90 Base) MCG/ACT inhaler Inhale 2 puffs into the lungs every 4 (four) hours as needed for wheezing or shortness of breath. INHALE 2 PUFFS BY MOUTH IN EACH  NOSTRIL EVERY 4 TO 6 HOURS AS NEEDED 05/28/21   Kozlow, Donnamarie Poag, MD  atorvastatin (LIPITOR) 40 MG tablet Take 1 tablet by mouth daily.    [provider]  CALCIUM-VITAMIN D PO Take 1 tablet by mouth daily.    [provider]  candesartan (ATACAND) 8 MG tablet TAKE 1 TABLET(8 MG) BY MOUTH DAILY 01/04/21   [name], MD  cholestyramine (QUESTRAN) 4 g packet Take 1 packet (4 g total) by mouth 2 (two) times daily. 03/27/21   Thornton Park, MD  ciclopirox (PENLAC) 8 % solution Apply 1 application. topically at bedtime. 04/13/21   [provider]  clopidogrel (PLAVIX) 75 MG tablet Take 1 tablet (75 mg total) by mouth daily. 04/30/21   [name], MD  Cyanocobalamin (VITAMIN B12) 1000 MCG TBCR Take 1 tablet by mouth daily.    [provider]  famotidine (PEPCID) 40 MG tablet Take 1 tablet (40 mg total) by mouth every evening. 05/28/21   Kozlow, Donnamarie Poag, MD  fluticasone (FLONASE) 50 MCG/ACT nasal spray Place 2 sprays into both nostrils daily. 05/28/21   Kozlow, Donnamarie Poag, MD  Fluticasone-Umeclidin-Vilant (TRELEGY ELLIPTA)  100-62.5-25 MCG/ACT AEPB Inhale 1 puff into the lungs daily. 05/28/21   Kozlow, Donnamarie Poag, MD  furosemide (LASIX) 20 MG tablet Take 1 tablet (20 mg total) by mouth daily. 04/15/21   [name], MD  leflunomide (ARAVA) 20 MG tablet Take 20 mg by mouth daily.    [provider]  METAMUCIL FIBER PO Take by mouth See admin instructions. Mix 1 tablespoonful of powder into 4-8 ounces of desired beverage and drink once a day    [provider]    Family History    Family History  Problem Relation Age of Onset   Lung cancer Mother    Coronary artery disease Son    Heart attack Son 86   Arthritis/Rheumatoid Maternal Grandmother    Coronary artery disease Son    Arthritis/Rheumatoid Son    She indicated that her mother is deceased. She indicated that her father is deceased. She indicated that her maternal grandmother is  deceased. She indicated that her maternal grandfather is deceased. She indicated that her paternal grandmother is deceased. She indicated that her paternal grandfather is deceased. She indicated that only one of her two sons is alive.  Social History    Social History   Socioeconomic History   Marital status: Widowed    Spouse name: Not on file   Number of children: 2   Years of education: College   Highest education level: Master's degree (e.g., MA, MS, MEng, MEd, MSW, MBA)  Occupational History   Occupation: Nurse, adult: [employer]  Tobacco Use   Smoking status: Former    Packs/day: 1.00    Years: 35.00    Pack years: 35.00    Types: Cigarettes    Quit date: 01/07/1988    Years since quitting: 33.4   Smokeless tobacco: Never  Vaping Use   Vaping Use: Never used  Substance and Sexual Activity   Alcohol use: No    Comment: not since 1988   Drug use: No   Sexual activity: Not Currently  Other Topics Concern   Not on file  Social History Narrative   Not on file   Social Determinants of Health   Financial Resource Strain: Not on file  Food Insecurity: Not on file  Transportation Needs: Not on file  Physical Activity: Not on file  Stress: Not on file  Social Connections: Not on file  Intimate Partner Violence: Not on file     Review of Systems    General:  No chills, fever, night sweats or weight changes.  Cardiovascular:  No chest pain, dyspnea on exertion, edema, orthopnea, palpitations, paroxysmal nocturnal dyspnea. Dermatological: No rash, lesions/masses Respiratory: No cough, dyspnea Urologic: No hematuria, dysuria Abdominal:   No nausea, vomiting, diarrhea, bright red blood per rectum, melena, or hematemesis Neurologic:  No visual changes, wkns, changes in mental status. All other systems reviewed and are otherwise negative except as noted above.  Physical Exam    VS:  BP 118/62   Pulse 88   Ht 5\' 2"  (1.575 m)   Wt 162 lb 3.2 oz (73.6 kg)   SpO2 97%    BMI 29.67 kg/m  , BMI Body mass index is 29.67 kg/m. GEN: Well nourished, well developed, in no acute distress. HEENT: normal. Neck: Supple, no JVD, carotid bruits, or masses. Cardiac: RRR, no murmurs, rubs, or gallops. No clubbing, cyanosis, generalized bilateral lower extremity nonpitting edema.  Radials/DP/PT 2+ and equal bilaterally.  Respiratory:  Respirations regular and unlabored, clear to  auscultation bilaterally. GI: Soft, nontender, nondistended, BS + x 4. MS: no deformity or atrophy. Skin: warm and dry, no rash. Neuro:  Strength and sensation are intact. Psych: Normal affect.  Accessory Clinical Findings    Recent Labs: 05/23/2021: TSH 1.577 05/24/2021: ALT 9; Magnesium 1.9 05/26/2021: B Natriuretic Peptide 45.5; BUN 17; Creatinine, Ser 0.81; Hemoglobin 9.5; Platelets 202; Potassium 4.0; Sodium 137   Recent Lipid Panel    Component Value Date/Time   CHOL 128 01/02/2021 0418   TRIG 51 01/02/2021 0418   HDL 60 01/02/2021 0418   CHOLHDL 2.1 01/02/2021 0418   VLDL 10 01/02/2021 0418   LDLCALC 58 01/02/2021 0418    ECG personally reviewed by me today-none today.  Echocardiogram 05/24/2021 IMPRESSIONS     1. Distal septal / apical hypokinesis . Left ventricular ejection  fraction, by estimation, is 50 to 55%. The left ventricle has low normal  function. The left ventricle has no regional wall motion abnormalities.  There is mild left ventricular  hypertrophy. Left ventricular diastolic parameters were normal.   2. Right ventricular systolic function is normal. The right ventricular  size is normal. There is normal pulmonary artery systolic pressure.   3. Left atrial size was mildly dilated.   4. The mitral valve is abnormal. Trivial mitral valve regurgitation. No  evidence of mitral stenosis.   5. The aortic valve is tricuspid. There is mild calcification of the  aortic valve. There is mild thickening of the aortic valve. Aortic valve  regurgitation is trivial.  Aortic valve sclerosis/calcification is present,  without any evidence of aortic  stenosis.   6. The inferior vena cava is normal in size with greater than 50%  respiratory variability, suggesting right atrial pressure of 3 mmHg.   FINDINGS   Left Ventricle: Distal septal / apical hypokinesis. Left ventricular  ejection fraction, by estimation, is 50 to 55%. The left ventricle has low  normal function. The left ventricle has no regional wall motion  abnormalities. The left ventricular internal  cavity size was normal in size. There is mild left ventricular  hypertrophy. Left ventricular diastolic parameters were normal.   Right Ventricle: The right ventricular size is normal. No increase in  right ventricular wall thickness. Right ventricular systolic function is  normal. There is normal pulmonary artery systolic pressure. The tricuspid  regurgitant velocity is 2.62 m/s, and   with an assumed right atrial pressure of 8 mmHg, the estimated right  ventricular systolic pressure is 91.4 mmHg.   Left Atrium: Left atrial size was mildly dilated.   Right Atrium: Right atrial size was normal in size.   Pericardium: There is no evidence of pericardial effusion.   Mitral Valve: The mitral valve is abnormal. There is mild thickening of  the mitral valve leaflet(s). There is mild calcification of the mitral  valve leaflet(s). Trivial mitral valve regurgitation. No evidence of  mitral valve stenosis. MV peak gradient,  6.1 mmHg. The mean mitral valve gradient is 2.0 mmHg.   Tricuspid Valve: The tricuspid valve is normal in structure. Tricuspid  valve regurgitation is mild . No evidence of tricuspid stenosis.   Aortic Valve: The aortic valve is tricuspid. There is mild calcification  of the aortic valve. There is mild thickening of the aortic valve. Aortic  valve regurgitation is trivial. Aortic valve sclerosis/calcification is  present, without any evidence of  aortic stenosis. Aortic  valve mean gradient measures 2.0 mmHg. Aortic  valve peak gradient measures 4.5 mmHg. Aortic valve area, by  VTI measures  3.65 cm.   Pulmonic Valve: The pulmonic valve was normal in structure. Pulmonic valve  regurgitation is trivial. No evidence of pulmonic stenosis.   Aorta: The aortic root is normal in size and structure.   Venous: The inferior vena cava is normal in size with greater than 50%  respiratory variability, suggesting right atrial pressure of 3 mmHg.   IAS/Shunts: No atrial level shunt detected by color flow Doppler.  Cardiac catheterization 10/08/2016  Prox LAD to Mid LAD lesion, 10 %stenosed. LV end diastolic pressure is normal.   1. Mild non-obstructive CAD 2. Non-ischemic cardiomyopathy 3. Normal filling pressures   Recommendations: Medical management of cardiomyopathy  Assessment & Plan   1.  Bradycardia-continues to have episodes of dizziness with increased physical activity and activities such as bending down.  Currently wearing cardiac event monitor.    Echocardiogram 5/23 showed EF of 50-55%.  Details above.  Previously noted to be anemic which may be contributing to her dizziness.  Labs from 05/26/2021 showed hemoglobin of 9.5.  We will recheck CBC today. Awaiting results of cardiac event monitor Order CBC-send labs to rheumatology (Dr. Amil Amen) Increase iron in diet  Chronic systolic CHF-no increased DOE or activity tolerance.  Echocardiogram showed EF of 50-55% on most recent echocardiogram.  Details above. Continue furosemide, candesartan Heart healthy low-sodium diet-salty 6 given Increase physical activity as tolerated  Essential hypertension-BP today 118/62.  Well-controlled at home. Continue furosemide, candesartan Heart healthy low-sodium diet-salty 6 given Increase physical activity as tolerated  Hyperlipidemia-LDL 64 on 05/01/2021 Continue atorvastatin, clopidogrel Heart healthy low-sodium high-fiber diet  CVA-reports compliance with  clopidogrel and denies bleeding issues. Continue clopidogrel, statin therapy Follows with PCP  Disposition: Follow-up with Dr. Stanford Breed or me after cardiac event monitor results have been posted.  Jossie Ng. Rmani Kellogg NP-C    06/10/2021, 10:30 AM [place] [address] Office [phone] Fax [phone]  Notice: This dictation was prepared with Dragon dictation along with smaller phrase technology. Any transcriptional errors that result from this process are unintentional and may not be corrected upon review.  I spent 14 minutes examining this patient, reviewing medications, and using patient centered shared decision making involving her cardiac care.  Prior to her visit I spent greater than 20 minutes reviewing her past medical history,  medications, and prior cardiac tests.

## 2021-06-10 ENCOUNTER — Encounter: Payer: Self-pay | Admitting: General Practice

## 2021-06-10 ENCOUNTER — Ambulatory Visit (INDEPENDENT_AMBULATORY_CARE_PROVIDER_SITE_OTHER): Payer: HMO | Admitting: General Practice

## 2021-06-10 VITALS — BP 118/62 | HR 88 | Ht 62.0 in | Wt 162.2 lb

## 2021-06-10 DIAGNOSIS — I428 Other cardiomyopathies: Secondary | ICD-10-CM

## 2021-06-10 DIAGNOSIS — I10 Essential (primary) hypertension: Secondary | ICD-10-CM | POA: Diagnosis not present

## 2021-06-10 DIAGNOSIS — E7800 Pure hypercholesterolemia, unspecified: Secondary | ICD-10-CM | POA: Diagnosis not present

## 2021-06-10 DIAGNOSIS — R001 Bradycardia, unspecified: Secondary | ICD-10-CM | POA: Diagnosis not present

## 2021-06-10 DIAGNOSIS — Z8673 Personal history of transient ischemic attack (TIA), and cerebral infarction without residual deficits: Secondary | ICD-10-CM

## 2021-06-10 LAB — CBC
Hematocrit: 33.4 % — ABNORMAL LOW (ref 34.0–46.6)
Hemoglobin: 11 g/dL — ABNORMAL LOW (ref 11.1–15.9)
MCH: 30.7 pg (ref 26.6–33.0)
MCHC: 32.9 g/dL (ref 31.5–35.7)
MCV: 93 fL (ref 79–97)
Platelets: 252 10*3/uL (ref 150–450)
RBC: 3.58 x10E6/uL — ABNORMAL LOW (ref 3.77–5.28)
RDW: 13 % (ref 11.7–15.4)
WBC: 6.7 10*3/uL (ref 3.4–10.8)

## 2021-06-10 NOTE — Patient Instructions (Addendum)
Medication Instructions:  The current medical regimen is effective;  continue present plan and medications as directed. Please refer to the Current Medication list given to you today.   *If you need a refill on your cardiac medications before your next appointment, please call your pharmacy*  Lab Work:    Cbc today      If you have labs (blood work) drawn today and your tests are completely normal, you will receive your results only by: [place] (if you have MyChart) OR  A paper copy in the mail If you have any lab test that is abnormal or we need to change your treatment, we will call you to review the results.  Special Instructions  PLEASE INCREASE PHYSICAL ACTIVITY AS TOLERATED   PLEASE EAT IRON RICH FOODS  Follow-Up: Your next appointment:  4 week(s) In Person with Kirk Ruths, MD  appointment already scheduled.   At [facility], you and your health needs are our priority.  As part of our continuing mission to provide you with exceptional heart care, we have created designated Provider Care Teams.  These Care Teams include your primary Cardiologist (physician) and Advanced Practice Providers (APPs -  Physician Assistants and Nurse Practitioners) who all work together to provide you with the care you need, when you need it.   Important Information About Sugar             Iron-Rich Diet  Iron is a mineral that helps your body produce hemoglobin. Hemoglobin is a protein in red blood cells that carries oxygen to your body's tissues. Eating too little iron may cause you to feel weak and tired, and it can increase your risk of infection. Iron is naturally found in many foods, and many foods have iron added to them (are iron-fortified). You may need to follow an iron-rich diet if you do not have enough iron in your body due to certain medical conditions. The amount of iron that you need each day depends on your age, your sex, and any medical conditions you have. Follow  instructions from your health care provider or a dietitian about how much iron you should eat each day. What are tips for following this plan? Reading food labels Check food labels to see how many milligrams (mg) of iron are in each serving. Cooking Cook foods in pots and pans that are made from iron. Take these steps to make it easier for your body to absorb iron from certain foods: Soak beans overnight before cooking. Soak whole grains overnight and drain them before using. Ferment flours before baking, such as by using yeast in bread dough. Meal planning When you eat foods that contain iron, you should eat them with foods that are high in vitamin C. These include oranges, peppers, tomatoes, potatoes, and mangoes. Vitamin C helps your body absorb iron. Certain foods and drinks prevent your body from absorbing iron properly. Avoid eating these foods in the same meal as iron-rich foods or with iron supplements. These foods include: Coffee, black tea, and red wine. Milk, dairy products, and foods that are high in calcium. Beans and soybeans. Whole grains. General information Take iron supplements only as told by your health care provider. An overdose of iron can be life-threatening. If you were prescribed iron supplements, take them with orange juice or a vitamin C supplement. When you eat iron-fortified foods or take an iron supplement, you should also eat foods that naturally contain iron, such as meat, poultry, and fish. Eating naturally  iron-rich foods helps your body absorb the iron that is added to other foods or contained in a supplement. Iron from animal sources is better absorbed than iron from plant sources. What foods should I eat? Fruits Prunes. Raisins. Eat fruits high in vitamin C, such as oranges, grapefruits, and strawberries, with iron-rich foods. Vegetables Spinach (cooked). Green peas. Broccoli. Fermented vegetables. Eat vegetables high in vitamin C, such as leafy greens,  potatoes, bell peppers, and tomatoes, with iron-rich foods. Grains Iron-fortified breakfast cereal. Iron-fortified whole-wheat bread. Enriched rice. Sprouted grains. Meats and other proteins Beef liver. Beef. [place]. Chicken. Oysters. Shrimp. [place]. Sardines. Chickpeas. Nuts. Tofu. Pumpkin seeds. Beverages Tomato juice. Fresh orange juice. Prune juice. Hibiscus tea. Iron-fortified instant breakfast shakes. Sweets and desserts Blackstrap molasses. Seasonings and condiments Tahini. Fermented soy sauce. Other foods Wheat germ. The items listed above may not be a complete list of recommended foods and beverages. Contact a dietitian for more information. What foods should I limit? These are foods that should be limited while eating iron-rich foods as they can reduce the absorption of iron in your body. Grains Whole grains. Bran cereal. Bran flour. Meats and other proteins Soybeans. Products made from soy protein. Black beans. Lentils. Mung beans. Split peas. Dairy Milk. Cream. Cheese. Yogurt. Cottage cheese. Beverages Coffee. Black tea. Red wine. Sweets and desserts Cocoa. Chocolate. Ice cream. Seasonings and condiments Basil. Oregano. Large amounts of parsley. The items listed above may not be a complete list of foods and beverages you should limit. Contact a dietitian for more information. Summary Iron is a mineral that helps your body produce hemoglobin. Hemoglobin is a protein in red blood cells that carries oxygen to your body's tissues. Iron is naturally found in many foods, and many foods have iron added to them (are iron-fortified). When you eat foods that contain iron, you should eat them with foods that are high in vitamin C. Vitamin C helps your body absorb iron. Certain foods and drinks prevent your body from absorbing iron properly, such as whole grains and dairy products. You should avoid eating these foods in the same meal as iron-rich foods or with iron supplements.

## 2021-06-18 DIAGNOSIS — R001 Bradycardia, unspecified: Secondary | ICD-10-CM | POA: Diagnosis not present

## 2021-06-21 ENCOUNTER — Encounter: Payer: Self-pay | Admitting: *Deleted

## 2021-06-28 ENCOUNTER — Encounter: Payer: Self-pay | Admitting: Cardiology

## 2021-07-08 ENCOUNTER — Telehealth: Payer: Self-pay | Admitting: Emergency Medicine

## 2021-07-08 NOTE — Telephone Encounter (Signed)
Called and spoke with patient. Patient stated that she is out of her trelegy 100 inhaler and wanted to know if we had samples of the trelegy 200 or another alternative that she could use.   RB, please advise.

## 2021-07-10 MED ORDER — TRELEGY ELLIPTA 200-62.5-25 MCG/ACT IN AEPB
1.0000 | INHALATION_SPRAY | Freq: Every day | RESPIRATORY_TRACT | 0 refills | Status: DC
Start: 2021-07-10 — End: 2021-08-14

## 2021-07-10 NOTE — Telephone Encounter (Signed)
Called and spoke with patient. I made her aware that we did not have samples of Trelegy 11mcg and I would need to ask before leaving the Trelegy 222mcg up front for her. I asked her if she had tried to get patient assistance from [place] and she stated that she has but she does not qualify. She has not yet paid $600 OOP for her prescriptions yet.   RB is out of the office for the next week.   Judson Roch, are you ok with [place] leaving a sample of Trelegy 251mcg for her? She is normally on Trelegy 161mcg, which we do not have samples of at the moment. Thanks!

## 2021-07-10 NOTE — Telephone Encounter (Signed)
Called and spoke with patient. She is aware that I will place the sample up front for her.   Nothing further needed at time of call.

## 2021-08-05 NOTE — Progress Notes (Signed)
HPI: FU CM. Pt with H/O dyspnea; also with h/o lung ca, COPD and OSA. Cardiac catheterization October 2018 showed mild nonobstructive coronary disease and normal filling pressures. Echocardiogram repeated March 2019 and showed ejection fraction 30-35% and mild left atrial enlargement. Declined ICD previously. Monitor September 2021 showed sinus bradycardia, normal sinus rhythm, occasional PAC, 4 and 6 beats of PAT and occasional PVC. Toprol was decreased due to bradycardia.  Patient had CVA December 2022 likely secondary to small vessel disease. Most recent echocardiogram May 2023 showed apical hypokinesis with ejection fraction 50 to 55%, mild left atrial enlargement, trace aortic insufficiency.  Monitor June 2023 showed sinus rhythm with occasional PAC, brief runs of PAT, occasional PVC and rare couplet.  Seen May 2023 with dizziness and beta-blocker discontinued.  Since last seen she does have some dyspnea on exertion but no orthopnea, PND, pedal edema, chest pain or syncope.  Current Outpatient Medications  Medication Sig Dispense Refill   albuterol (VENTOLIN HFA) 108 (90 Base) MCG/ACT inhaler Inhale 2 puffs into the lungs every 4 (four) hours as needed for wheezing or shortness of breath. INHALE 2 PUFFS BY MOUTH IN EACH NOSTRIL EVERY 4 TO 6 HOURS AS NEEDED 18 g 1   atorvastatin (LIPITOR) 40 MG tablet Take 1 tablet by mouth daily.     CALCIUM-VITAMIN D PO Take 1 tablet by mouth daily.     candesartan (ATACAND) 8 MG tablet TAKE 1 TABLET(8 MG) BY MOUTH DAILY 90 tablet 3   cholestyramine (QUESTRAN) 4 g packet Take 1 packet (4 g total) by mouth 2 (two) times daily. 60 each 11   ciclopirox (PENLAC) 8 % solution Apply 1 application  topically at bedtime.     clopidogrel (PLAVIX) 75 MG tablet Take 1 tablet (75 mg total) by mouth daily. 30 tablet 3   Cyanocobalamin (VITAMIN B12) 1000 MCG TBCR Take 1 tablet by mouth daily.     fluticasone (FLONASE) 50 MCG/ACT nasal spray Place 2 sprays into both  nostrils daily. (Patient taking differently: Place 2 sprays into both nostrils daily as needed.) 1 g 5   Fluticasone-Umeclidin-Vilant (TRELEGY ELLIPTA) 100-62.5-25 MCG/ACT AEPB Inhale 1 puff into the lungs daily. 60 each 5   Fluticasone-Umeclidin-Vilant (TRELEGY ELLIPTA) 200-62.5-25 MCG/ACT AEPB Inhale 1 puff into the lungs daily. 60 each 0   leflunomide (ARAVA) 20 MG tablet Take 20 mg by mouth daily.     METAMUCIL FIBER PO Take by mouth See admin instructions. Mix 1 tablespoonful of powder into 4-8 ounces of desired beverage and drink once a day     No current facility-administered medications for this visit.     Past Medical History:  Diagnosis Date   Abnormal CT scan    Allergic rhinitis    Anemia 2015   after lung surgery   Angio-edema    Complication of anesthesia    Fentanyl allergy, "claustrophobia"   COPD (chronic obstructive pulmonary disease) ([place])    Depression    Diaphoresis 07/30/2015   Excessive sweating with little exertion   Difficult intravenous access    GERD (gastroesophageal reflux disease)    In the past   Hyperlipidemia    Hypertension    Left bundle branch block    Lung cancer ([place]) 2015   left upper lobe wedge removed-no further tx.   Neuropathy    Tingling in arms, fingers, and feet (Since 06/15/16)   Obesity    OSA (obstructive sleep apnea)    denies.   Osteoarthritis  ra also   Osteoporosis    Recurrent upper respiratory infection (URI)    Rheumatoid arthritis(714.0)    Dr. Amil Amen   SOB (shortness of breath) on exertion    Uterine cancer ([place]) 1971   tx surgical    Past Surgical History:  Procedure Laterality Date   ABDOMINAL HYSTERECTOMY     in situ carcinoma partial   CARDIAC CATHETERIZATION  01/27/11   minor non-obs CAD, NL EF, mild pulm HTN   CATARACT EXTRACTION, BILATERAL     last done 12'16-   COLONOSCOPY W/ BIOPSIES AND POLYPECTOMY     COLONOSCOPY WITH PROPOFOL N/A 02/13/2015   Procedure: COLONOSCOPY WITH PROPOFOL;  Surgeon:  Laurence Spates, MD;  Location: [facility];  Service: Endoscopy;  Laterality: N/A;   ESOPHAGOGASTRODUODENOSCOPY (EGD) WITH PROPOFOL N/A 09/30/2016   Procedure: ESOPHAGOGASTRODUODENOSCOPY (EGD) WITH PROPOFOL;  Surgeon: Laurence Spates, MD;  Location: [facility];  Service: Endoscopy;  Laterality: N/A;   LYMPH NODE DISSECTION Left 04/12/2013   Procedure: LYMPH NODE DISSECTION;  Surgeon: [name], MD;  Location: [place];  Service: Thoracic;  Laterality: Left;   RIGHT/LEFT HEART CATH AND CORONARY ANGIOGRAPHY N/A 10/08/2016   Procedure: RIGHT/LEFT HEART CATH AND CORONARY ANGIOGRAPHY;  Surgeon: Burnell Blanks, MD;  Location: [place] [facility];  Service: Cardiovascular;  Laterality: N/A;   TONSILLECTOMY     VESICOVAGINAL FISTULA CLOSURE W/ TAH  1971   VIDEO ASSISTED THORACOSCOPY (VATS)/THOROCOTOMY Left 04/12/2013   Procedure: VIDEO ASSISTED THORACOSCOPY (VATS)/THOROCOTOMY, WITH LEFT UPPER LOBE WEDGE RESECTION, CHEST WALL BIOPSY ;  Surgeon: [name], MD;  Location: [place];  Service: Thoracic;  Laterality: Left;   VIDEO BRONCHOSCOPY N/A 04/12/2013   Procedure: VIDEO BRONCHOSCOPY;  Surgeon: [name], MD;  Location: [facility];  Service: Thoracic;  Laterality: N/A;    Social History   Socioeconomic History   Marital status: Widowed    Spouse name: Not on file   Number of children: 2   Years of education: College   Highest education level: Master's degree (e.g., MA, MS, MEng, MEd, MSW, MBA)  Occupational History   Occupation: Nurse, adult: [employer]  Tobacco Use   Smoking status: Former    Packs/day: 1.00    Years: 35.00    Total pack years: 35.00    Types: Cigarettes    Quit date: 01/07/1988    Years since quitting: 33.6   Smokeless tobacco: Never  Vaping Use   Vaping Use: Never used  Substance and Sexual Activity   Alcohol use: No    Comment: not since 1988   Drug use: No   Sexual activity: Not Currently  Other Topics Concern   Not on file  Social History  Narrative   Not on file   Social Determinants of Health   Financial Resource Strain: Low Risk  (12/06/2019)   Overall Financial Resource Strain (CARDIA)    Difficulty of Paying Living Expenses: Not hard at all  Food Insecurity: No Food Insecurity (12/06/2019)   Hunger Vital Sign    Worried About Running Out of Food in the Last Year: Never true    [place] in the Last Year: Never true  Transportation Needs: No Transportation Needs (12/06/2019)   PRAPARE - Hydrologist (Medical): No    Lack of Transportation (Non-Medical): No  Physical Activity: Inactive (12/06/2019)   Exercise Vital Sign    Days of Exercise per Week: 0 days    Minutes of Exercise  per Session: 0 min  Stress: No Stress Concern Present (12/06/2019)   [place]    Feeling of Stress : Not at all  Social Connections: Moderately Integrated (12/06/2019)   Social Connection and Isolation Panel [NHANES]    Frequency of Communication with Friends and Family: More than three times a week    Frequency of Social Gatherings with Friends and Family: More than three times a week    Attends Religious Services: More than 4 times per year    Active Member of [employer] or Organizations: Yes    Attends Archivist Meetings: 1 to 4 times per year    Marital Status: Widowed  Intimate Partner Violence: Not At Risk (12/06/2019)   Humiliation, Afraid, Rape, and Kick questionnaire    Fear of Current or Ex-Partner: No    Emotionally Abused: No    Physically Abused: No    Sexually Abused: No    Family History  Problem Relation Age of Onset   Lung cancer Mother    Coronary artery disease Son    Heart attack Son 24   Arthritis/Rheumatoid Maternal Grandmother    Coronary artery disease Son    Arthritis/Rheumatoid Son     ROS: no fevers or chills, productive cough, hemoptysis, dysphasia, odynophagia, melena, hematochezia, dysuria,  hematuria, rash, seizure activity, orthopnea, PND, pedal edema, claudication. Remaining systems are negative.  Physical Exam: Well-developed well-nourished in no acute distress.  Skin is warm and dry.  HEENT is normal.  Neck is supple.  Chest is clear to auscultation with normal expansion.  Cardiovascular exam is regular rate and rhythm.  Abdominal exam nontender or distended. No masses palpated. Extremities show no edema. neuro grossly intact  A/P  1 nonischemic cardiomyopathy-LV function low normal on most recent echocardiogram.  Continue ARB.  Beta-blocker discontinued due to bradycardia.  2 hypertension-blood pressure controlled.  Continue present medical regimen.  3 hyperlipidemia-continue statin.  4 chronic combined systolic/diastolic congestive heart failure-she remains euvolemic on examination.  We have changed her Lasix to 20 mg daily as needed instead of daily.  5 prior CVA-this is felt secondary to small vessel disease.  Continue aspirin and Plavix.  6 history of dyspnea-multifactorial including combination of prior lung resection secondary to lung cancer, COPD, obstructive sleep apnea and obesity hypoventilation syndrome.  Kirk Ruths, MD

## 2021-08-06 ENCOUNTER — Telehealth: Payer: Self-pay | Admitting: Emergency Medicine

## 2021-08-06 MED ORDER — TRELEGY ELLIPTA 200-62.5-25 MCG/ACT IN AEPB: 1.0000 | INHALATION_SPRAY | Freq: Every day | RESPIRATORY_TRACT | 0 refills | Status: DC

## 2021-08-06 NOTE — Telephone Encounter (Signed)
Called and spoke with patient. Advised patient I would leave trelegy samples up front for her to come and pick them up. Patient stated that she will come either today 08/06/2021 or tomorrow 08/07/2021 to pick them up.   Nothing further needed.

## 2021-08-14 ENCOUNTER — Encounter: Payer: Self-pay | Admitting: Family Medicine

## 2021-08-14 ENCOUNTER — Ambulatory Visit: Payer: HMO | Admitting: Family Medicine

## 2021-08-14 VITALS — BP 132/81 | HR 79 | Ht 62.0 in | Wt 158.5 lb

## 2021-08-14 DIAGNOSIS — I6330 Cerebral infarction due to thrombosis of unspecified cerebral artery: Secondary | ICD-10-CM | POA: Diagnosis not present

## 2021-08-14 NOTE — Progress Notes (Signed)
[facility] [address] [place]. [place] [address] [phone]       HOSPITAL FOLLOW UP NOTE  Ms. Taylor Berry Date of Birth:  14-Mar-1940 Medical Record Number:  [id number]   Reason for Referral:  hospital stroke follow up  SUBJECTIVE:   CHIEF COMPLAINT:  Chief Complaint  Patient presents with   Follow-up    Rm 1, alone. Here to f/u for stroke hx. Pt had extreme dizziness and went to the ED in May. She no longer has the dizzy spells. Using a cane to ambulate.     HPI:   Taylor SCRITCHFIELD is a 81 y.o. who  has a past medical history of Abnormal CT scan, Allergic rhinitis, Anemia (2015), Angio-edema, Complication of anesthesia, COPD (chronic obstructive pulmonary disease) ([place]), Depression, Diaphoresis (07/30/2015), Difficult intravenous access, GERD (gastroesophageal reflux disease), Hyperlipidemia, Hypertension, Left bundle branch block, Lung cancer ([place]) (2015), Neuropathy, Obesity, OSA (obstructive sleep apnea), Osteoarthritis, Osteoporosis, Recurrent upper respiratory infection (URI), Rheumatoid arthritis(714.0), SOB (shortness of breath) on exertion, and Uterine cancer ([place]) (1971).    Patient presented on 12/30/20 with complaints of speech difficulty. CTH was negative for acute finding and NIHSS 0, and was given Aspirin 325 mg. Patient found to have acute metabolic encephalopathy secondary to hyponatremia due to SIADH. She was given lasix and placed of fluid restriction. MRI obtained and demonstrated punctate acute-subacute stroke in pons. She didn't have any numbness or weakness at the time of symptom onset. She is now ambulating with a walker and has never had to use one before. She continues with physical therapy at home. She has a follow up with her PCP in March and they are trying to get a follow up sooner.   Since discharge from hospital, she reports doing fairly well. HH recommended at discharge, however, patient lives alone and son was unable to  accommodate. She went to [facility] for 2 weeks for rehab. Now continues home PT and feels she is making progress. She does feel more off balance than prior to stroke. She is using a walker. No falls. She took asa and Plavix for three week and has continued Plavix alone. She continues atorvastatin and lipids are well managed. BP has been normal. She is followed cardiology and pulmonology. She is not currently using CPAP therapy (followed by pulm). She reports she can not tolerate therapy.   Personally reviewed hospitalization pertinent progress notes, lab work and imaging.  Evaluated by Dr. Erlinda Hong.   UPDATE 08/14/2021 ALL: Taylor Berry returns for follow up post CVA in 12/2020. She reports doing well. She completed initial [facility] and was referred back to another session through outpatient PT in 05/2021. She feels that gait is fairly stable but she does continue to note imbalance at times. She is using a cane for stability. She has not had any falls.   She is followed by cardiology. She was admitted to hospital in 05/2021 for dizziness. Xio monitor showed occasional PVC and runs of SVT. Metoprolol advised but declined by patient. She is concerned that metoprolol may worsen dizziness due to low heart rate. She has taken in the past. She reports dizziness has resolved. She continues atorvastatin 40mg  and Plavix daily. She is followed by PCP, pulmonology and rheumatology regularly. Sodium has normalized. She is walking about 30 minutes every day. HR jumps to around 110. She denies chest pain.    PERTINENT IMAGING/LABS  MR BRAIN : left small pontine infarct CTA HEAD/NECK : no acute finding MRA HEAD/NECK : unremarkable  2D ECHO : EF 45-50%  A1C Lab Results  Component Value Date   HGBA1C 5.4 01/02/2021    Lipid Panel     Component Value Date/Time   CHOL 128 01/02/2021 0418   TRIG 51 01/02/2021 0418   HDL 60 01/02/2021 0418   CHOLHDL 2.1 01/02/2021 0418   VLDL 10 01/02/2021 0418   LDLCALC 58 01/02/2021  0418     ROS:   14 system review of systems performed and negative with exception of those listed in HPI  PMH:  Past Medical History:  Diagnosis Date   Abnormal CT scan    Allergic rhinitis    Anemia 2015   after lung surgery   Angio-edema    Complication of anesthesia    Fentanyl allergy, "claustrophobia"   COPD (chronic obstructive pulmonary disease) ([place])    Depression    Diaphoresis 07/30/2015   Excessive sweating with little exertion   Difficult intravenous access    GERD (gastroesophageal reflux disease)    In the past   Hyperlipidemia    Hypertension    Left bundle branch block    Lung cancer ([place]) 2015   left upper lobe wedge removed-no further tx.   Neuropathy    Tingling in arms, fingers, and feet (Since 06/15/16)   Obesity    OSA (obstructive sleep apnea)    denies.   Osteoarthritis    ra also   Osteoporosis    Recurrent upper respiratory infection (URI)    Rheumatoid arthritis(714.0)    Dr. Amil Amen   SOB (shortness of breath) on exertion    Uterine cancer ([place]) 1971   tx surgical    PSH:  Past Surgical History:  Procedure Laterality Date   ABDOMINAL HYSTERECTOMY     in situ carcinoma partial   CARDIAC CATHETERIZATION  01/27/11   minor non-obs CAD, NL EF, mild pulm HTN   CATARACT EXTRACTION, BILATERAL     last done 12'16-   COLONOSCOPY W/ BIOPSIES AND POLYPECTOMY     COLONOSCOPY WITH PROPOFOL N/A 02/13/2015   Procedure: COLONOSCOPY WITH PROPOFOL;  Surgeon: Laurence Spates, MD;  Location: [facility];  Service: Endoscopy;  Laterality: N/A;   ESOPHAGOGASTRODUODENOSCOPY (EGD) WITH PROPOFOL N/A 09/30/2016   Procedure: ESOPHAGOGASTRODUODENOSCOPY (EGD) WITH PROPOFOL;  Surgeon: Laurence Spates, MD;  Location: [facility];  Service: Endoscopy;  Laterality: N/A;   LYMPH NODE DISSECTION Left 04/12/2013   Procedure: LYMPH NODE DISSECTION;  Surgeon: [name], MD;  Location: [facility];  Service: Thoracic;  Laterality: Left;   RIGHT/LEFT HEART CATH AND CORONARY  ANGIOGRAPHY N/A 10/08/2016   Procedure: RIGHT/LEFT HEART CATH AND CORONARY ANGIOGRAPHY;  Surgeon: Burnell Blanks, MD;  Location: [facility];  Service: Cardiovascular;  Laterality: N/A;   TONSILLECTOMY     VESICOVAGINAL FISTULA CLOSURE W/ TAH  1971   VIDEO ASSISTED THORACOSCOPY (VATS)/THOROCOTOMY Left 04/12/2013   Procedure: VIDEO ASSISTED THORACOSCOPY (VATS)/THOROCOTOMY, WITH LEFT UPPER LOBE WEDGE RESECTION, CHEST WALL BIOPSY ;  Surgeon: [name], MD;  Location: [facility];  Service: Thoracic;  Laterality: Left;   VIDEO BRONCHOSCOPY N/A 04/12/2013   Procedure: VIDEO BRONCHOSCOPY;  Surgeon: [name], MD;  Location: [facility] OR;  Service: Thoracic;  Laterality: N/A;    Social History:  Social History   Socioeconomic History   Marital status: Widowed    Spouse name: Not on file   Number of children: 2   Years of education: College   Highest education level: Master's degree (e.g., MA, MS, MEng, MEd, MSW, MBA)  Occupational History   Occupation: Nurse, adult: [employer]  Tobacco Use   Smoking status: Former    Packs/day: 1.00    Years: 35.00    Total pack years: 35.00    Types: Cigarettes    Quit date: 01/07/1988    Years since quitting: 33.6   Smokeless tobacco: Never  Vaping Use   Vaping Use: Never used  Substance and Sexual Activity   Alcohol use: No    Comment: not since 1988   Drug use: No   Sexual activity: Not Currently  Other Topics Concern   Not on file  Social History Narrative   Not on file   Social Determinants of Health   Financial Resource Strain: Low Risk  (12/06/2019)   Overall Financial Resource Strain (CARDIA)    Difficulty of Paying Living Expenses: Not hard at all  Food Insecurity: No Food Insecurity (12/06/2019)   Hunger Vital Sign    Worried About Running Out of Food in the Last Year: Never true    Ran Out of Food in the Last Year: Never true  Transportation Needs: No Transportation Needs (12/06/2019)   PRAPARE - Armed forces logistics/support/administrative officer (Medical): No    Lack of Transportation (Non-Medical): No  Physical Activity: Inactive (12/06/2019)   Exercise Vital Sign    Days of Exercise per Week: 0 days    Minutes of Exercise per Session: 0 min  Stress: No Stress Concern Present (12/06/2019)   [place]    Feeling of Stress : Not at all  Social Connections: Moderately Integrated (12/06/2019)   Social Connection and Isolation Panel [NHANES]    Frequency of Communication with Friends and Family: More than three times a week    Frequency of Social Gatherings with Friends and Family: More than three times a week    Attends Religious Services: More than 4 times per year    Active Member of [employer] or Organizations: Yes    Attends Archivist Meetings: 1 to 4 times per year    Marital Status: Widowed  Intimate Partner Violence: Not At Risk (12/06/2019)   Humiliation, Afraid, Rape, and Kick questionnaire    Fear of Current or Ex-Partner: No    Emotionally Abused: No    Physically Abused: No    Sexually Abused: No    Family History:  Family History  Problem Relation Age of Onset   Lung cancer Mother    Coronary artery disease Son    Heart attack Son 25   Arthritis/Rheumatoid Maternal Grandmother    Coronary artery disease Son    Arthritis/Rheumatoid Son     Medications:   Current Outpatient Medications on File Prior to Visit  Medication Sig Dispense Refill   albuterol (VENTOLIN HFA) 108 (90 Base) MCG/ACT inhaler Inhale 2 puffs into the lungs every 4 (four) hours as needed for wheezing or shortness of breath. INHALE 2 PUFFS BY MOUTH IN EACH NOSTRIL EVERY 4 TO 6 HOURS AS NEEDED 18 g 1   atorvastatin (LIPITOR) 40 MG tablet Take 1 tablet by mouth daily.     CALCIUM-VITAMIN D PO Take 1 tablet by mouth daily.     candesartan (ATACAND) 8 MG tablet TAKE 1 TABLET(8 MG) BY MOUTH DAILY 90 tablet 3   cholestyramine (QUESTRAN) 4 g packet  Take 1 packet (4 g total) by mouth 2 (two) times daily. 60 each 11   ciclopirox (PENLAC) 8 % solution Apply 1  application  topically at bedtime.     clopidogrel (PLAVIX) 75 MG tablet Take 1 tablet (75 mg total) by mouth daily. 30 tablet 3   Cyanocobalamin (VITAMIN B12) 1000 MCG TBCR Take 1 tablet by mouth daily.     famotidine (PEPCID) 40 MG tablet Take 1 tablet (40 mg total) by mouth every evening. 30 tablet 5   fluticasone (FLONASE) 50 MCG/ACT nasal spray Place 2 sprays into both nostrils daily. (Patient taking differently: Place 2 sprays into both nostrils daily as needed.) 1 g 5   Fluticasone-Umeclidin-Vilant (TRELEGY ELLIPTA) 100-62.5-25 MCG/ACT AEPB Inhale 1 puff into the lungs daily. 60 each 5   Fluticasone-Umeclidin-Vilant (TRELEGY ELLIPTA) 200-62.5-25 MCG/ACT AEPB Inhale 1 puff into the lungs daily. 60 each 0   furosemide (LASIX) 20 MG tablet Take 1 tablet (20 mg total) by mouth daily. 90 tablet 1   leflunomide (ARAVA) 20 MG tablet Take 20 mg by mouth daily.     METAMUCIL FIBER PO Take by mouth See admin instructions. Mix 1 tablespoonful of powder into 4-8 ounces of desired beverage and drink once a day     No current facility-administered medications on file prior to visit.    Allergies:   Allergies  Allergen Reactions   Latex Itching and Rash   Metoprolol Tartrate Other (See Comments) and Shortness Of Breath   Ace Inhibitors Cough   Angiotensin Receptor Blockers Swelling and Other (See Comments)    Tongue swelling   Fentanyl Other (See Comments)    Hallucinations "I go crazy"      Hydrochlorothiazide Other (See Comments)    Decreased Sodium   Hydroxychloroquine Other (See Comments)    "Decreased sodium"   Penicillins Other (See Comments)    Yeast infections Has patient had a PCN reaction causing immediate rash, facial/tongue/throat swelling, SOB or lightheadedness with hypotension:No Has patient had a PCN reaction causing severe rash involving mucus membranes or skin  necrosis: No Has patient had a PCN reaction that required hospitalization No Has patient had a PCN reaction occurring within the last 10 years: Yes If all of the above answers are "NO", then may proceed with Cephalosporin use.  Other reaction(s): yeast infection    OBJECTIVE:  Physical Exam  Vitals:   08/14/21 1258  BP: 132/81  Pulse: 79  Weight: 158 lb 8 oz (71.9 kg)  Height: 5\' 2"  (1.575 m)    Body mass index is 28.99 kg/m. No results found.     12/06/2019   11:01 AM  Depression screen PHQ 2/9  Decreased Interest 0  Down, Depressed, Hopeless 0  PHQ - 2 Score 0     General: well developed, well nourished, seated, in no evident distress Head: head normocephalic and atraumatic.   Neck: supple with no carotid or supraclavicular bruits Cardiovascular: regular rate and rhythm, no murmurs Musculoskeletal: no deformity Skin:  no rash/petichiae Vascular:  Normal pulses all extremities. No edema noted today    Neurologic Exam Mental Status: Awake and fully alert.  Fluent speech and language.  Oriented to place and time. Recent and remote memory intact. Attention span, concentration and fund of knowledge appropriate. Mood and affect appropriate.  Cranial Nerves: Fundoscopic exam reveals sharp disc margins. Pupils equal, briskly reactive to light. Extraocular movements full without nystagmus. Visual fields full to confrontation. Hearing intact. Facial sensation intact. Face, tongue, palate moves normally and symmetrically.  Motor: Normal bulk and tone. Strength 4/5 in all four extremities. Sensory.: intact to touch , pinprick , position and vibratory sensation.  Coordination: Finger-to-nose and heel-to-shin performed accurately bilaterally. Gait and Station: Able to push to standing position. Gait is slow and cautious but stable. Ambulation with single prong cane.  Reflexes: 1+ and symmetric.    NIHSS  0 Modified Rankin  1   ASSESSMENT: Taylor Berry is a 81 y.o. year  old female. Vascular risk factors include HTN, HLD, advanced age, obesity, OSA, untreated.   PLAN:  Punctate acute-subacute stroke in pons and Acute Metabolic Encephalopathy secondary to small vessel disease: Residual deficit: gait instability. Continue Plavix and atorvastatin 40mg  daily for secondary stroke prevention. Discussed secondary stroke prevention measures and importance of close PCP follow up for aggressive stroke risk factor management. I have gone over the pathophysiology of stroke, warning signs and symptoms, risk factors and their management in some detail with instructions to go to the closest emergency room for symptoms of concern. Continue with home physical therapy. Continue using cane. Fall precautions advised.  HTN: BP goal <130/90. Stable on  Candesartan 8 MG  per PCP HLD: LDL goal <70. Recent LDL 58. Continue atorvastatin 40mg  daily per PCP 6.   OSA: Not current treated. Education provided regarding risks of untreated OSA. Advised   discussion with pulmonology if she wishes to resume.    Follow up as needed   CC:  [facility] provider: Dr. Leonie Man PCP: Collene Leyden, MD    I spent 30 minutes of face-to-face and non-face-to-face time with patient.  This included previsit chart review including review of recent hospitalization, lab review, study review, order entry, electronic health record documentation, patient education regarding recent stroke including etiology, secondary stroke prevention measures and importance of managing stroke risk factors, residual deficits and typical recovery time and answered all other questions to patient satisfaction   Debbora Presto, [facility]  [facility] [address] [place], [place] [address]  Phone [phone] Fax [phone] Note: This document was prepared with digital dictation and possible smart phrase technology. Any transcriptional errors that result from this process are unintentional.

## 2021-08-14 NOTE — Patient Instructions (Signed)
Below is our plan:  Punctate acute-subacute stroke in pons and Acute Metabolic Encephalopathy secondary to small vessel disease: Residual deficit: gait instability. Continue Plavix and atorvastatin 40mg  daily for secondary stroke prevention. Discussed secondary stroke prevention measures and importance of close PCP follow up for aggressive stroke risk factor management. I have gone over the pathophysiology of stroke, warning signs and symptoms, risk factors and their management in some detail with instructions to go to the closest emergency room for symptoms of concern. Continue with home physical therapy. Continue using cane. Fall precautions advised.  HTN: BP goal <130/90. Stable on  Candesartan 8 MG  per PCP HLD: LDL goal <70. Recent LDL 58. Continue atorvastatin 40mg  daily per PCP 6.   OSA: Not current treated. Education provided regarding risks of untreated   OSA. Advised   discussion with pulmonology if she wishes to resume.   Please make sure you are staying well hydrated. I recommend 50-60 ounces daily. Well balanced diet and regular exercise encouraged. Consistent sleep schedule with 6-8 hours recommended.   Please continue follow up with care team as directed.   Follow up with me as needed  You may receive a survey regarding today's visit. I encourage you to leave honest feed back as I do use this information to improve patient care. Thank you for seeing me today!

## 2021-08-16 DIAGNOSIS — L821 Other seborrheic keratosis: Secondary | ICD-10-CM | POA: Diagnosis not present

## 2021-08-16 DIAGNOSIS — L814 Other melanin hyperpigmentation: Secondary | ICD-10-CM | POA: Diagnosis not present

## 2021-08-16 DIAGNOSIS — D1801 Hemangioma of skin and subcutaneous tissue: Secondary | ICD-10-CM | POA: Diagnosis not present

## 2021-08-16 DIAGNOSIS — L820 Inflamed seborrheic keratosis: Secondary | ICD-10-CM | POA: Diagnosis not present

## 2021-08-16 DIAGNOSIS — D2261 Melanocytic nevi of right upper limb, including shoulder: Secondary | ICD-10-CM | POA: Diagnosis not present

## 2021-08-16 DIAGNOSIS — Z85828 Personal history of other malignant neoplasm of skin: Secondary | ICD-10-CM | POA: Diagnosis not present

## 2021-08-19 ENCOUNTER — Encounter: Payer: Self-pay | Admitting: Cardiology

## 2021-08-19 ENCOUNTER — Ambulatory Visit (INDEPENDENT_AMBULATORY_CARE_PROVIDER_SITE_OTHER): Payer: HMO | Admitting: Cardiology

## 2021-08-19 VITALS — BP 104/62 | HR 82 | Ht 62.0 in | Wt 157.0 lb

## 2021-08-19 DIAGNOSIS — I10 Essential (primary) hypertension: Secondary | ICD-10-CM

## 2021-08-19 DIAGNOSIS — R001 Bradycardia, unspecified: Secondary | ICD-10-CM

## 2021-08-19 DIAGNOSIS — I428 Other cardiomyopathies: Secondary | ICD-10-CM | POA: Diagnosis not present

## 2021-08-19 NOTE — Patient Instructions (Addendum)
Medication Instructions:  Take Lasix 20 mg daily as needed for swelling Continue all other medications *If you need a refill on your cardiac medications before your next appointment, please call your pharmacy*   Lab Work: None ordered   Testing/Procedures: None ordered   Follow-Up: At [facility], you and your health needs are our priority.  As part of our continuing mission to provide you with exceptional heart care, we have created designated Provider Care Teams.  These Care Teams include your primary Cardiologist (physician) and Advanced Practice Providers (APPs -  Physician Assistants and Nurse Practitioners) who all work together to provide you with the care you need, when you need it.  We recommend signing up for the patient portal called "MyChart".  Sign up information is provided on this After Visit Summary.  MyChart is used to connect with patients for Virtual Visits (Telemedicine).  Patients are able to view lab/test results, encounter notes, upcoming appointments, etc.  Non-urgent messages can be sent to your provider as well.   To learn more about what you can do with MyChart, go to [email or web address].      Your next appointment:  6 months   Call in Oct to schedule Feb appointment     The format for your next appointment: Office   Provider:  Dr.Crenshaw   Important Information About Sugar

## 2021-08-23 DIAGNOSIS — H903 Sensorineural hearing loss, bilateral: Secondary | ICD-10-CM | POA: Diagnosis not present

## 2021-09-03 ENCOUNTER — Other Ambulatory Visit: Payer: Self-pay | Admitting: Cardiology

## 2021-10-30 ENCOUNTER — Telehealth: Payer: Self-pay | Admitting: Allergy and Immunology

## 2021-10-30 ENCOUNTER — Ambulatory Visit: Payer: HMO | Admitting: Podiatrist

## 2021-10-30 ENCOUNTER — Telehealth: Payer: Self-pay | Admitting: Emergency Medicine

## 2021-10-30 MED ORDER — TRELEGY ELLIPTA 100-62.5-25 MCG/ACT IN AEPB: 1.0000 | INHALATION_SPRAY | Freq: Every day | RESPIRATORY_TRACT | 0 refills | Status: DC

## 2021-10-30 NOTE — Telephone Encounter (Signed)
Patient is requesting samples for trelegy - patient uses trelegy 100 but states she can take 200 if our office does not have the 100.   Please advise  Best contact number: [phone]

## 2021-10-30 NOTE — Telephone Encounter (Signed)
Pt informed samples are at front desk waiting on her to pick them up

## 2021-10-30 NOTE — Telephone Encounter (Signed)
Called and spoke with patient. She was calling to see if we had any samples of Trelegy 128mcg. I advised her that we do and that I would leave 2 samples at the front desk for her. She verbalized understanding.   Nothing further needed at time of call.

## 2021-10-31 ENCOUNTER — Other Ambulatory Visit: Payer: Self-pay | Admitting: Cardiology

## 2021-10-31 NOTE — Telephone Encounter (Signed)
Patient picked up samples. It looks like pulmonary gave her samples for Trelegy also on yesterday.

## 2021-11-01 ENCOUNTER — Telehealth: Payer: Self-pay | Admitting: Emergency Medicine

## 2021-11-01 NOTE — Telephone Encounter (Signed)
Called and spoke with pt letting her know that we do recommend pts to get the RSV vaccine and stated to her that at upcoming OV, RB could further discuss CT which was done 09/2020 and then go from there to see when another one might need to be done. Pt verbalized understanding. Nothing further needed.

## 2021-11-11 ENCOUNTER — Other Ambulatory Visit: Payer: Self-pay

## 2021-11-11 MED ORDER — ATORVASTATIN CALCIUM 40 MG PO TABS: 40.0000 mg | ORAL_TABLET | Freq: Every day | ORAL | 0 refills | Status: DC

## 2021-11-13 ENCOUNTER — Encounter: Payer: Self-pay | Admitting: Podiatrist

## 2021-11-13 ENCOUNTER — Ambulatory Visit: Payer: HMO | Admitting: Podiatrist

## 2021-11-13 DIAGNOSIS — M79609 Pain in unspecified limb: Secondary | ICD-10-CM

## 2021-11-13 DIAGNOSIS — B351 Tinea unguium: Secondary | ICD-10-CM

## 2021-11-13 NOTE — Progress Notes (Signed)
Subjective: Taylor Berry is a 81 y.o. female patient who presents to office today with concern of long,mildly painful nails  while ambulating in shoes; unable to trim.  She has rheumatoid arthritis and this makes it difficult for her to trim her nails.  Patient denies any new cramping, numbness, burning or tingling in the legs or feet.  Patient Active Problem List   Diagnosis Date Noted   Chronic systolic heart failure ([place])    Bradycardia 05/23/2021   Postural dizziness with presyncope 05/23/2021   Acute prerenal azotemia 05/23/2021   Cerebral thrombosis with cerebral infarction 01/02/2021   Encephalopathy acute/expressive aphasia  12/30/2020   Hyponatremia 12/30/2020   GERD (gastroesophageal reflux disease) 12/30/2020   Hypocalcemia 12/30/2020   Acute on chronic combined systolic and diastolic CHF (congestive heart failure) ([place]) 04/28/2019   non obstructive CAD 04/28/2019   Edema 04/14/2019   Abnormal CT of the chest 03/22/2018   Acid reflux 07/13/2017   Affective psychosis ([place]) 07/13/2017   Alcohol dependence in remission ([place]) 07/13/2017   BD (Bowen's disease) 07/13/2017   History of colon polyps 07/13/2017   Rheumatoid arthritis ([place]) 07/13/2017   Chronic bronchitis ([place]) 07/13/2017   Secondary pulmonary hypertension 07/13/2017   Obstructive apnea 07/13/2017   Age related osteoporosis 07/13/2017   Non-ischemic cardiomyopathy/diastolic and systolic CHF    Hyperhidrosis 06/18/2016   Anemia in neoplastic disease 05/02/2013   Allergic rhinitis 04/29/2013   Depression 04/29/2013   Lung cancer, left upper lobe 02/03/2013   Pulmonary infiltrates 01/05/2013   Mild pulmonary hypertension ([place]) 02/17/2011   OSA (obstructive sleep apnea) 02/17/2011   Dyspnea on exertion 11/22/2010   COPD (chronic obstructive pulmonary disease) ([place]) 07/20/2009   Carcinoma in situ of cervix uteri 06/26/2009   Pure hypercholesterolemia 06/26/2009   OBESITY 06/26/2009   Chronic disease anemia  06/26/2009   Essential (primary) hypertension 06/26/2009   ARTHRITIS, RHEUMATOID 06/26/2009   OSTEOPOROSIS 06/26/2009   Cough 06/26/2009   Current Outpatient Medications on File Prior to Visit  Medication Sig Dispense Refill   albuterol (VENTOLIN HFA) 108 (90 Base) MCG/ACT inhaler Inhale 2 puffs into the lungs every 4 (four) hours as needed for wheezing or shortness of breath. INHALE 2 PUFFS BY MOUTH IN EACH NOSTRIL EVERY 4 TO 6 HOURS AS NEEDED 18 g 1   atorvastatin (LIPITOR) 40 MG tablet Take 1 tablet (40 mg total) by mouth daily. Take 1 tablet by mouth daily.Please contact the office to schedule appointment for additional refills. 90 tablet 0   CALCIUM-VITAMIN D PO Take 1 tablet by mouth daily.     candesartan (ATACAND) 8 MG tablet TAKE 1 TABLET(8 MG) BY MOUTH DAILY 90 tablet 3   cholestyramine (QUESTRAN) 4 g packet Take 1 packet (4 g total) by mouth 2 (two) times daily. 60 each 11   ciclopirox (PENLAC) 8 % solution Apply 1 application  topically at bedtime.     clopidogrel (PLAVIX) 75 MG tablet TAKE 1 TABLET(75 MG) BY MOUTH DAILY 30 tablet 3   Cyanocobalamin (VITAMIN B12) 1000 MCG TBCR Take 1 tablet by mouth daily.     fluticasone (FLONASE) 50 MCG/ACT nasal spray Place 2 sprays into both nostrils daily. (Patient taking differently: Place 2 sprays into both nostrils daily as needed.) 1 g 5   Fluticasone-Umeclidin-Vilant (TRELEGY ELLIPTA) 100-62.5-25 MCG/ACT AEPB Inhale 1 puff into the lungs daily. 2 each 0   furosemide (LASIX) 20 MG tablet TAKE 1 TABLET(20 MG) BY MOUTH DAILY 90 tablet 3   leflunomide (ARAVA) 20 MG  tablet Take 20 mg by mouth daily.     METAMUCIL FIBER PO Take by mouth See admin instructions. Mix 1 tablespoonful of powder into 4-8 ounces of desired beverage and drink once a day     No current facility-administered medications on file prior to visit.   Allergies  Allergen Reactions   Latex Itching and Rash   Metoprolol Tartrate Other (See Comments) and Shortness Of Breath    Ace Inhibitors Cough   Angiotensin Receptor Blockers Swelling and Other (See Comments)    Tongue swelling   Fentanyl Other (See Comments)    Hallucinations "I go crazy"      Hydrochlorothiazide Other (See Comments)    Decreased Sodium   Hydroxychloroquine Other (See Comments)    "Decreased sodium"   Penicillamine Nausea And Vomiting   Penicillins Other (See Comments)    Yeast infections Has patient had a PCN reaction causing immediate rash, facial/tongue/throat swelling, SOB or lightheadedness with hypotension:No Has patient had a PCN reaction causing severe rash involving mucus membranes or skin necrosis: No Has patient had a PCN reaction that required hospitalization No Has patient had a PCN reaction occurring within the last 10 years: Yes If all of the above answers are "NO", then may proceed with Cephalosporin use.  Other reaction(s): yeast infection     Objective: General: Patient is awake, alert, and oriented x 3 and in no acute distress.  Integument: Skin is warm, dry and supple bilateral. Nails are tender, long, thickened and  dystrophic with subungual debris, consistent with onychomycosis, 1-5 bilateral. No signs of infection. No open lesions or preulcerative lesions present bilateral. Remaining integument unremarkable.  Vasculature:  Dorsalis Pedis pulse 2/4 bilateral. Posterior Tibial pulse  1/4 bilateral.  Capillary fill time <3 sec 1-5 bilateral. Positive hair growth to the level of the digits. Temperature gradient within normal limits. No varicosities present bilateral. No edema present bilateral.   Neurology: The patient has intact sensation measured with a 5.07/10g Semmes Weinstein Monofilament at all pedal sites bilateral . Vibratory sensation diminished bilateral with tuning fork. No Babinski sign present bilateral.   Musculoskeletal: No symptomatic pedal deformities noted bilateral. Muscular strength 5/5 in all lower extremity muscular groups bilateral without  pain on range of motion . No tenderness with calf compression bilateral.  Assessment and Plan:   ICD-10-CM   1. Pain due to onychomycosis of nail  B35.1    M79.609         -Examined patient. -Mechanically debrided all nails 1-5 bilateral using sterile nail nipper and filed with sterile dremel without incident  -Answered all patient questions -Patient to return  in 2-3 months for continued foot care.  -Patient advised to call the office if any problems or questions arise in the meantime.  Bronson Ing, DPM

## 2021-12-03 ENCOUNTER — Ambulatory Visit (INDEPENDENT_AMBULATORY_CARE_PROVIDER_SITE_OTHER): Payer: HMO | Admitting: Allergy and Immunology

## 2021-12-03 VITALS — BP 110/74 | HR 69 | Temp 97.4°F | Resp 16 | Ht 62.0 in | Wt 154.7 lb

## 2021-12-03 DIAGNOSIS — J4489 Other specified chronic obstructive pulmonary disease: Secondary | ICD-10-CM | POA: Diagnosis not present

## 2021-12-03 DIAGNOSIS — J3089 Other allergic rhinitis: Secondary | ICD-10-CM

## 2021-12-03 DIAGNOSIS — K219 Gastro-esophageal reflux disease without esophagitis: Secondary | ICD-10-CM

## 2021-12-03 NOTE — Progress Notes (Unsigned)
7

## 2021-12-03 NOTE — Progress Notes (Unsigned)
[facility]   Follow-up Note  Referring Provider: Kelton Pillar, MD Primary Provider: Collene Leyden, MD Date of Office Visit: 12/03/2021  Subjective:   Taylor Berry (DOB: 1940/08/29) is a 81 y.o. female who returns to the [facility] on 12/03/2021 in re-evaluation of the following:  HPI: Adrienna returns to the clinic in evaluation of COPD/asthma overlap, allergic rhinitis, LPR.  I last saw her in this clinic 28 May 2021.  She is done quite well with her airway and rarely uses the short acting bronchodilator as long she continues to consistently use a triple inhaler.  She has not required a systemic steroid to treat an exacerbation of lower airway disease.  Likewise, she has done very well with her upper airway while using a nasal steroid.  She has not had to use an antibiotic to treat an episode of sinusitis.  She has developed some problems with ear popping recently and she started her Flonase on a daily basis for the past week and this has helped this issue.  She had very little problems with reflux and she rarely uses famotidine at this point in time.  She has received the flu vaccine, RSV vaccine, COVID-vaccine.  Allergies as of 12/03/2021       Reactions   Latex Itching, Rash   Metoprolol Tartrate Other (See Comments), Shortness Of Breath   Ace Inhibitors Cough   Angiotensin Receptor Blockers Swelling, Other (See Comments)   Tongue swelling   Fentanyl Other (See Comments)   Hallucinations "I go crazy"     Hydrochlorothiazide Other (See Comments)   Decreased Sodium   Hydroxychloroquine Other (See Comments)   "Decreased sodium"   Penicillamine Nausea And Vomiting   Penicillins Other (See Comments)   Yeast infections Has patient had a PCN reaction causing immediate rash, facial/tongue/throat swelling, SOB or lightheadedness with hypotension:No Has patient had a PCN reaction causing severe rash involving mucus  membranes or skin necrosis: No Has patient had a PCN reaction that required hospitalization No Has patient had a PCN reaction occurring within the last 10 years: Yes If all of the above answers are "NO", then may proceed with Cephalosporin use. Other reaction(s): yeast infection        Medication List    albuterol 108 (90 Base) MCG/ACT inhaler Commonly known as: VENTOLIN HFA Inhale 2 puffs into the lungs every 4 (four) hours as needed for wheezing or shortness of breath. INHALE 2 PUFFS BY MOUTH IN EACH NOSTRIL EVERY 4 TO 6 HOURS AS NEEDED   atorvastatin 40 MG tablet Commonly known as: LIPITOR Take 1 tablet (40 mg total) by mouth daily. Take 1 tablet by mouth daily.Please contact the office to schedule appointment for additional refills.   CALCIUM + D3 PO Take 1 tablet by mouth daily.   CALCIUM-VITAMIN D PO Take 1 tablet by mouth daily.   candesartan 8 MG tablet Commonly known as: ATACAND TAKE 1 TABLET(8 MG) BY MOUTH DAILY   cholestyramine 4 g packet Commonly known as: Questran Take 1 packet (4 g total) by mouth 2 (two) times daily.   clopidogrel 75 MG tablet Commonly known as: PLAVIX TAKE 1 TABLET(75 MG) BY MOUTH DAILY   fluticasone 50 MCG/ACT nasal spray Commonly known as: FLONASE Place 2 sprays into both nostrils daily. What changed:  when to take this reasons to take this   furosemide 20 MG tablet Commonly known as: LASIX TAKE 1 TABLET(20 MG) BY MOUTH DAILY  leflunomide 20 MG tablet Commonly known as: ARAVA Take 20 mg by mouth daily.   METAMUCIL FIBER PO Take by mouth See admin instructions. Mix 1 tablespoonful of powder into 4-8 ounces of desired beverage and drink once a day   Prolia 60 MG/ML Sosy injection Generic drug: denosumab Inject 60 mg into the skin every 6 (six) months.   Trelegy Ellipta 100-62.5-25 MCG/ACT Aepb Generic drug: Fluticasone-Umeclidin-Vilant Inhale 1 puff into the lungs daily.   Vitamin B12 1000 MCG Tbcr Take 1 tablet by  mouth daily.    Past Medical History:  Diagnosis Date  . Abnormal CT scan   . Allergic rhinitis   . Anemia 2015   after lung surgery  . Angio-edema   . Complication of anesthesia    Fentanyl allergy, "claustrophobia"  . COPD (chronic obstructive pulmonary disease) ([place])   . Depression   . Diaphoresis 07/30/2015   Excessive sweating with little exertion  . Difficult intravenous access   . GERD (gastroesophageal reflux disease)    In the past  . Hyperlipidemia   . Hypertension   . Left bundle branch block   . Lung cancer ([place]) 2015   left upper lobe wedge removed-no further tx.  . Neuropathy    Tingling in arms, fingers, and feet (Since 06/15/16)  . Obesity   . OSA (obstructive sleep apnea)    denies.  . Osteoarthritis    ra also  . Osteoporosis   . Recurrent upper respiratory infection (URI)   . Rheumatoid arthritis(714.0)    Dr. Amil Amen  . SOB (shortness of breath) on exertion   . Uterine cancer ([place]) 1971   tx surgical    Past Surgical History:  Procedure Laterality Date  . ABDOMINAL HYSTERECTOMY     in situ carcinoma partial  . CARDIAC CATHETERIZATION  01/27/11   minor non-obs CAD, NL EF, mild pulm HTN  . CATARACT EXTRACTION, BILATERAL     last done 12'16-  . COLONOSCOPY W/ BIOPSIES AND POLYPECTOMY    . COLONOSCOPY WITH PROPOFOL N/A 02/13/2015   Procedure: COLONOSCOPY WITH PROPOFOL;  Surgeon: Laurence Spates, MD;  Location: [facility];  Service: Endoscopy;  Laterality: N/A;  . ESOPHAGOGASTRODUODENOSCOPY (EGD) WITH PROPOFOL N/A 09/30/2016   Procedure: ESOPHAGOGASTRODUODENOSCOPY (EGD) WITH PROPOFOL;  Surgeon: Laurence Spates, MD;  Location: [facility];  Service: Endoscopy;  Laterality: N/A;  . LYMPH NODE DISSECTION Left 04/12/2013   Procedure: LYMPH NODE DISSECTION;  Surgeon: [name], MD;  Location: [facility];  Service: Thoracic;  Laterality: Left;  . RIGHT/LEFT HEART CATH AND CORONARY ANGIOGRAPHY N/A 10/08/2016   Procedure: RIGHT/LEFT HEART CATH AND CORONARY  ANGIOGRAPHY;  Surgeon: Burnell Blanks, MD;  Location: [facility];  Service: Cardiovascular;  Laterality: N/A;  . TONSILLECTOMY    . VESICOVAGINAL FISTULA CLOSURE W/ TAH  1971  . VIDEO ASSISTED THORACOSCOPY (VATS)/THOROCOTOMY Left 04/12/2013   Procedure: VIDEO ASSISTED THORACOSCOPY (VATS)/THOROCOTOMY, WITH LEFT UPPER LOBE WEDGE RESECTION, CHEST WALL BIOPSY ;  Surgeon: [name], MD;  Location: [facility];  Service: Thoracic;  Laterality: Left;  Marland Kitchen VIDEO BRONCHOSCOPY N/A 04/12/2013   Procedure: VIDEO BRONCHOSCOPY;  Surgeon: [name], MD;  Location: [facility];  Service: Thoracic;  Laterality: N/A;    Review of systems negative except as noted in HPI / PMHx or noted below:  Review of Systems  Constitutional: Negative.   HENT: Negative.    Eyes: Negative.   Respiratory: Negative.    Cardiovascular: Negative.   Gastrointestinal: Negative.   Genitourinary: Negative.  Musculoskeletal: Negative.   Skin: Negative.   Neurological: Negative.   Endo/Heme/Allergies: Negative.   Psychiatric/Behavioral: Negative.       Objective:   Vitals:   12/03/21 1058  BP: 110/74  Pulse: 69  Resp: 16  Temp: (!) 97.4 F (36.3 C)  SpO2: 98%   Height: 5\' 2"  (157.5 cm)  Weight: 154 lb 11.2 oz (70.2 kg)   Physical Exam Constitutional:      Appearance: She is not diaphoretic.  HENT:     Head: Normocephalic.     Right Ear: Tympanic membrane, ear canal and external ear normal.     Left Ear: Tympanic membrane, ear canal and external ear normal.     Nose: Nose normal. No mucosal edema or rhinorrhea.     Mouth/Throat:     Pharynx: Uvula midline. No oropharyngeal exudate.  Eyes:     Conjunctiva/sclera: Conjunctivae normal.  Neck:     Thyroid: No thyromegaly.     Trachea: Trachea normal. No tracheal tenderness or tracheal deviation.  Cardiovascular:     Rate and Rhythm: Normal rate and regular rhythm.     Heart sounds: Normal heart sounds, S1 normal and S2 normal. No murmur  heard. Pulmonary:     Effort: No respiratory distress.     Breath sounds: Normal breath sounds. No stridor. No wheezing or rales.  Lymphadenopathy:     Head:     Right side of head: No tonsillar adenopathy.     Left side of head: No tonsillar adenopathy.     Cervical: No cervical adenopathy.  Skin:    Findings: No erythema or rash.     Nails: There is no clubbing.  Neurological:     Mental Status: She is alert.    Diagnostics:    Spirometry was performed and demonstrated an FEV1 of *** at *** % of predicted.  The patient had an Asthma Control Test with the following results:  .    Assessment and Plan:   1. COPD with asthma   2. Perennial allergic rhinitis   3. LPRD (laryngopharyngeal reflux disease)     1.  Continue Flonase -1-2 sprays each nostril 1 time per day  2.  Continue Trelegy 100 - 1 inhalation 1 time per day.    3.  If needed:   A. Pro-air HFA 2 inhalations every 4-6 hours.  B. Famotidine 40mg  1 time per day    4.  Return to clinic in 6 months or earlier if problem   Aubryana appears to be doing quite well on her current therapy and she will remain on anti-inflammatory agents for both her upper and lower airway and she can treat her reflux should therapy prove to be required as noted above.  Assuming she does well with this plan I will see her back in this clinic in 6 months or earlier if there is a problem.   Allena Katz, MD Allergy / Immunology [place]

## 2021-12-03 NOTE — Patient Instructions (Addendum)
  1.  Continue Flonase -1-2 sprays each nostril 1 time per day  2.  Continue Trelegy 100 - 1 inhalation 1 time per day.    3.  If needed:   A. Pro-air HFA 2 inhalations every 4-6 hours.  B. Famotidine 40mg  1 time per day    4.  Return to clinic in 6 months or earlier if problem

## 2021-12-05 ENCOUNTER — Encounter: Payer: Self-pay | Admitting: Allergy and Immunology

## 2021-12-05 NOTE — Addendum Note (Signed)
Addended by: Chip Boer R on: 12/05/2021 01:28 PM   Modules accepted: Orders

## 2021-12-12 ENCOUNTER — Telehealth: Payer: Self-pay | Admitting: Cardiology

## 2021-12-12 MED ORDER — CLOPIDOGREL BISULFATE 75 MG PO TABS
75.0000 mg | ORAL_TABLET | Freq: Every day | ORAL | 6 refills | Status: DC
Start: 2021-12-12 — End: 2022-05-27
  Filled 2022-02-11 – 2022-03-12 (×2): qty 30, 30d supply, fill #0

## 2021-12-12 NOTE — Telephone Encounter (Signed)
*  STAT* If patient is at the pharmacy, call can be transferred to refill team.   1. Which medications need to be refilled? (please list name of each medication and dose if known)  clopidogrel (PLAVIX) 75 MG tablet  2. Which pharmacy/location (including street and city if local pharmacy) is medication to be sent to? [id number] - [place], [place] - [address]  3. Do they need a 30 day or 90 day supply?  90 day supply

## 2021-12-17 ENCOUNTER — Telehealth: Payer: Self-pay | Admitting: Allergy and Immunology

## 2021-12-17 NOTE — Telephone Encounter (Signed)
Patient called and said that dr Neldon Mc said for her to call us to see if we have samples of Trelegy. [phone].

## 2021-12-17 NOTE — Telephone Encounter (Signed)
Hey do we have any samples in [place]?

## 2021-12-19 NOTE — Telephone Encounter (Signed)
Patient called back as she has not received a return call. I apologized to the patient that she has not heard back from our office. I informed patient that I do have a Trelegy 100 sample and have placed it up front for pick up. I informed patient that sample will be up front in [address] for pick up. Patient verbalized understanding and was appreciative for the sample.

## 2021-12-20 ENCOUNTER — Ambulatory Visit (INDEPENDENT_AMBULATORY_CARE_PROVIDER_SITE_OTHER): Payer: HMO | Admitting: Emergency Medicine

## 2021-12-20 ENCOUNTER — Encounter: Payer: Self-pay | Admitting: Emergency Medicine

## 2021-12-20 VITALS — BP 124/70 | Temp 98.4°F | Ht 62.0 in | Wt 152.0 lb

## 2021-12-20 DIAGNOSIS — K769 Liver disease, unspecified: Secondary | ICD-10-CM | POA: Diagnosis not present

## 2021-12-20 DIAGNOSIS — R9389 Abnormal findings on diagnostic imaging of other specified body structures: Secondary | ICD-10-CM

## 2021-12-20 DIAGNOSIS — J449 Chronic obstructive pulmonary disease, unspecified: Secondary | ICD-10-CM | POA: Diagnosis not present

## 2021-12-20 NOTE — Assessment & Plan Note (Signed)
Hemangioma or focal nodular hyperplasia noted on MRI abdomen.  A 32-month follow-up was recommended but the patient states that she wants to defer.

## 2021-12-20 NOTE — Progress Notes (Signed)
HPI:  ROV 08/31/20 --81 year old woman with a history of obesity, severe COPD, prior left upper lobe wedge resection for squamous cell lung cancer, untreated OSA.  Also with a history of CAD, uterine cancer, RA on Areva and hydroxychlorquine (Dr Amil Amen).  She has a medial right lower lobe pulmonary nodule that we have been following with serial imaging that had grown some going back to remote films.  Her most recent CT chest was 08/09/2019 at which time the nodule was stable going back to 01/2018. She has noticed some increased exertional SOB w exertion, especially with climbing hills. Her cough is well controlled. Rare to no albuterol use. Needs a new albuterol.  On trelegy, relying on samples. She is off PPI, on H2 blockade   ROV 12/20/21 --visit for 81 year old woman with a history of obesity and severe COPD, squamous cell lung cancer post left upper lobe wedge resection, OSA which has been untreated.  Also with CAD, RA on Areva and hydroxychloroquine, uterine cancer.  We have been following a medial right lower lobe pulmonary nodule with serial imaging (stable recently but enlarged compared with remote scans).  She was hospitalized in December 2022 with an acute left pontine CVA, then in May with dizziness and presyncope. She is using Trelegy, breathing well with most of the time, some exertional SOB. No cough or sputum. No flares. Never needs her albuterol. Flu and RSV and COVID all up to date.  She has decided that she does not want to have another abdominal MRI.   Most recent CT chest 09/07/2020 reviewed by me> showed stable chronic lung disease with some subpleural reticulation and basilar predominant cylindrical bronchiectasis, chronic right middle lobe volume loss.  There is a right lower lobe paraspinal soft tissue density 1.9 x 1.1 cm that is unchanged going back to 04/2016.  Note was made of a new right hepatic hypodensity  MRI abdomen 11/09/2020 showed a 2.3 x 1.7 cm hepatic lesion with brisk  arterial hyperenhancement, question hemangioma versus focal nodular hyperplasia.  Recommendation made to follow   EXAM:  Vitals:   12/20/21 0913  BP: 124/70  Temp: 98.4 F (36.9 C)  TempSrc: Oral  Weight: 152 lb (68.9 kg)  Height: 5\' 2"  (1.575 m)    Gen: Pleasant, overweight, in no distress,  normal affect  ENT: No lesions,  mouth clear, somewhat dry, oropharynx clear, no postnasal drip  Neck: No JVD, no stridor  Lungs: No use of accessory muscles, clear bilaterally, no wheeze.   Cardiovascular: RRR, heart sounds normal, no murmur or gallops, no peripheral edema  Musculoskeletal: No deformities, no cyanosis or clubbing  Neuro: alert, non focal  Skin: Warm, no lesions or rashes     Abnormal CT of the chest Paraspinous pulmonary opacity has been stable on most recent scan 09/2020 going back to 2018.  I do not think she needs any more dedicated chest CTs to follow this.  Reassured her about this today.  We can consider repeat CT if she has a clinical change.  Probably should follow her with chest x-rays given her history of rheumatoid arthritis.  Hepatic lesion Hemangioma or focal nodular hyperplasia noted on MRI abdomen.  A 81-month follow-up was recommended but the patient states that she wants to defer.  COPD (chronic obstructive pulmonary disease) (HCC) Overall stable.  Continue Trelegy, albuterol as needed.  Her vaccinations are up-to-date.  No flares.  Plan annual follow-up.   Baltazar Apo, MD, PhD 12/20/2021, 9:33 AM [facility] [phone]  or if no answer [phone]

## 2021-12-20 NOTE — Assessment & Plan Note (Signed)
Paraspinous pulmonary opacity has been stable on most recent scan 09/2020 going back to 2018.  I do not think she needs any more dedicated chest CTs to follow this.  Reassured her about this today.  We can consider repeat CT if she has a clinical change.  Probably should follow her with chest x-rays given her history of rheumatoid arthritis.

## 2021-12-20 NOTE — Assessment & Plan Note (Signed)
Overall stable.  Continue Trelegy, albuterol as needed.  Her vaccinations are up-to-date.  No flares.  Plan annual follow-up.

## 2021-12-20 NOTE — Patient Instructions (Addendum)
Your CT scan of the chest from 09/2020 was stable.  The scan has been stable going back to 2018.  You should not need any more CT scans to follow lung nodules.  Good news. Continue your Trelegy once daily.  Rinse and gargle after using. Keep albuterol available to use 2 puffs up to every 4 hours if needed for shortness of breath, chest tightness, wheezing.  Flu, RSV, COVID-19 vaccines are all up-to-date Follow with Dr. Lamonte Sakai in 12 months or sooner if you have any problems.

## 2022-01-23 DIAGNOSIS — M79641 Pain in right hand: Secondary | ICD-10-CM | POA: Diagnosis not present

## 2022-01-23 DIAGNOSIS — C3490 Malignant neoplasm of unspecified part of unspecified bronchus or lung: Secondary | ICD-10-CM | POA: Diagnosis not present

## 2022-01-23 DIAGNOSIS — M79642 Pain in left hand: Secondary | ICD-10-CM | POA: Diagnosis not present

## 2022-01-23 DIAGNOSIS — R52 Pain, unspecified: Secondary | ICD-10-CM | POA: Diagnosis not present

## 2022-01-23 DIAGNOSIS — M069 Rheumatoid arthritis, unspecified: Secondary | ICD-10-CM | POA: Diagnosis not present

## 2022-02-06 ENCOUNTER — Other Ambulatory Visit: Payer: Self-pay | Admitting: Cardiology

## 2022-02-06 ENCOUNTER — Other Ambulatory Visit (HOSPITAL_COMMUNITY): Payer: Self-pay

## 2022-02-07 ENCOUNTER — Other Ambulatory Visit (HOSPITAL_COMMUNITY): Payer: Self-pay

## 2022-02-07 ENCOUNTER — Telehealth: Payer: Self-pay | Admitting: Cardiology

## 2022-02-07 MED ORDER — ATORVASTATIN CALCIUM 40 MG PO TABS
40.0000 mg | ORAL_TABLET | Freq: Every day | ORAL | 5 refills | Status: DC
  Filled 2022-02-07: qty 60, 60d supply, fill #0
  Filled 2022-04-10: qty 60, 60d supply, fill #1
  Filled 2022-06-16: qty 60, 60d supply, fill #2
  Filled 2022-08-24: qty 60, 60d supply, fill #3

## 2022-02-07 MED ORDER — ATORVASTATIN CALCIUM 40 MG PO TABS: 40.0000 mg | ORAL_TABLET | Freq: Every day | ORAL | 1 refills | Status: DC

## 2022-02-07 MED ORDER — ATORVASTATIN CALCIUM 40 MG PO TABS
40.0000 mg | ORAL_TABLET | Freq: Every day | ORAL | 0 refills | Status: DC
  Filled 2022-02-07: qty 60, 60d supply, fill #0

## 2022-02-07 NOTE — Telephone Encounter (Signed)
Rx sent to [facility] -- on file

## 2022-02-07 NOTE — Addendum Note (Signed)
Addended by: Fidel Levy on: 02/07/2022 04:54 PM   Modules accepted: Orders

## 2022-02-07 NOTE — Telephone Encounter (Signed)
Taylor Berry with [name] is following up. She states they received the prescription, but their pharmacy is for transition of care only and the prescription needs to be sent to patient's local pharmacy.

## 2022-02-07 NOTE — Telephone Encounter (Signed)
*  STAT* If patient is at the pharmacy, call can be transferred to refill team.   1. Which medications need to be refilled? (please list name of each medication and dose if known) atorvastatin (LIPITOR) 40 MG tablet   2. Which pharmacy/location (including street and city if local pharmacy) is medication to be sent to? [facility]   3. Do they need a 30 day or 90 day supply? [place]

## 2022-02-08 ENCOUNTER — Other Ambulatory Visit (HOSPITAL_COMMUNITY): Payer: Self-pay

## 2022-02-10 ENCOUNTER — Other Ambulatory Visit: Payer: Self-pay

## 2022-02-11 ENCOUNTER — Other Ambulatory Visit (HOSPITAL_COMMUNITY): Payer: Self-pay

## 2022-02-11 MED ORDER — CLOPIDOGREL BISULFATE 75 MG PO TABS
75.0000 mg | ORAL_TABLET | Freq: Every day | ORAL | 3 refills | Status: DC
  Filled 2022-04-10: qty 30, 30d supply, fill #0

## 2022-02-11 MED ORDER — TRELEGY ELLIPTA 100-62.5-25 MCG/ACT IN AEPB: 1.0000 | INHALATION_SPRAY | Freq: Every day | RESPIRATORY_TRACT | 5 refills | Status: DC

## 2022-02-11 MED ORDER — CICLOPIROX 8 % EX SOLN: 1.0000 "application " | Freq: Every day | CUTANEOUS | 1 refills | Status: DC

## 2022-02-11 MED ORDER — CHOLESTYRAMINE 4 GM/DOSE PO POWD
4.0000 g | Freq: Every day | ORAL | 1 refills | Status: DC
  Filled 2022-02-27: qty 378, 30d supply, fill #0
  Filled 2022-03-03: qty 378, 90d supply, fill #0

## 2022-02-11 MED ORDER — FAMOTIDINE 40 MG PO TABS: 40.0000 mg | ORAL_TABLET | Freq: Every evening | ORAL | 5 refills | Status: DC

## 2022-02-11 MED FILL — Furosemide Tab 20 MG: ORAL | 90 days supply | Qty: 90 | Fill #0 | Status: CN

## 2022-02-12 ENCOUNTER — Other Ambulatory Visit (HOSPITAL_COMMUNITY): Payer: Self-pay

## 2022-02-14 ENCOUNTER — Other Ambulatory Visit (HOSPITAL_COMMUNITY): Payer: Self-pay

## 2022-02-21 DIAGNOSIS — M7989 Other specified soft tissue disorders: Secondary | ICD-10-CM | POA: Diagnosis not present

## 2022-02-21 DIAGNOSIS — Z79899 Other long term (current) drug therapy: Secondary | ICD-10-CM | POA: Diagnosis not present

## 2022-02-21 DIAGNOSIS — M1991 Primary osteoarthritis, unspecified site: Secondary | ICD-10-CM | POA: Diagnosis not present

## 2022-02-21 DIAGNOSIS — M0589 Other rheumatoid arthritis with rheumatoid factor of multiple sites: Secondary | ICD-10-CM | POA: Diagnosis not present

## 2022-02-21 DIAGNOSIS — M06322 Rheumatoid nodule, left elbow: Secondary | ICD-10-CM | POA: Diagnosis not present

## 2022-02-21 DIAGNOSIS — M25561 Pain in right knee: Secondary | ICD-10-CM | POA: Diagnosis not present

## 2022-02-21 DIAGNOSIS — M810 Age-related osteoporosis without current pathological fracture: Secondary | ICD-10-CM | POA: Diagnosis not present

## 2022-02-21 DIAGNOSIS — E663 Overweight: Secondary | ICD-10-CM | POA: Diagnosis not present

## 2022-02-21 DIAGNOSIS — R682 Dry mouth, unspecified: Secondary | ICD-10-CM | POA: Diagnosis not present

## 2022-02-21 DIAGNOSIS — M25552 Pain in left hip: Secondary | ICD-10-CM | POA: Diagnosis not present

## 2022-02-21 DIAGNOSIS — M25562 Pain in left knee: Secondary | ICD-10-CM | POA: Diagnosis not present

## 2022-02-21 DIAGNOSIS — Z6827 Body mass index (BMI) 27.0-27.9, adult: Secondary | ICD-10-CM | POA: Diagnosis not present

## 2022-02-27 ENCOUNTER — Other Ambulatory Visit: Payer: Self-pay

## 2022-02-27 ENCOUNTER — Other Ambulatory Visit (HOSPITAL_COMMUNITY): Payer: Self-pay

## 2022-02-28 ENCOUNTER — Other Ambulatory Visit: Payer: Self-pay

## 2022-03-03 ENCOUNTER — Other Ambulatory Visit (HOSPITAL_COMMUNITY): Payer: Self-pay

## 2022-03-07 ENCOUNTER — Other Ambulatory Visit (HOSPITAL_COMMUNITY): Payer: Self-pay

## 2022-03-07 DIAGNOSIS — I6930 Unspecified sequelae of cerebral infarction: Secondary | ICD-10-CM | POA: Diagnosis not present

## 2022-03-07 DIAGNOSIS — R42 Dizziness and giddiness: Secondary | ICD-10-CM | POA: Diagnosis not present

## 2022-03-07 DIAGNOSIS — I110 Hypertensive heart disease with heart failure: Secondary | ICD-10-CM | POA: Diagnosis not present

## 2022-03-07 MED ORDER — MECLIZINE HCL 25 MG PO TABS
25.0000 mg | ORAL_TABLET | Freq: Three times a day (TID) | ORAL | 0 refills | Status: AC | PRN
  Filled 2022-03-07: qty 20, 7d supply, fill #0

## 2022-03-10 ENCOUNTER — Telehealth: Payer: Self-pay | Admitting: Emergency Medicine

## 2022-03-10 ENCOUNTER — Ambulatory Visit: Payer: HMO | Admitting: Podiatry

## 2022-03-10 ENCOUNTER — Telehealth: Payer: Self-pay | Admitting: Allergy and Immunology

## 2022-03-10 DIAGNOSIS — M069 Rheumatoid arthritis, unspecified: Secondary | ICD-10-CM | POA: Diagnosis not present

## 2022-03-10 DIAGNOSIS — E7800 Pure hypercholesterolemia, unspecified: Secondary | ICD-10-CM | POA: Diagnosis not present

## 2022-03-10 DIAGNOSIS — M810 Age-related osteoporosis without current pathological fracture: Secondary | ICD-10-CM | POA: Diagnosis not present

## 2022-03-10 DIAGNOSIS — I110 Hypertensive heart disease with heart failure: Secondary | ICD-10-CM | POA: Diagnosis not present

## 2022-03-10 DIAGNOSIS — J449 Chronic obstructive pulmonary disease, unspecified: Secondary | ICD-10-CM | POA: Diagnosis not present

## 2022-03-10 NOTE — Telephone Encounter (Signed)
PT stated that she wanted to have a couple of more samples of the Trelegy 100 mcg.  PT stated she has also been taking 200 mcg.  Best Contact : [phone]

## 2022-03-10 NOTE — Telephone Encounter (Signed)
Patient has called requesting more samples of Trelegy she advises copay is $47 which is too expensive for her.   Dr. Lamonte Sakai is okay to give her more samples or I can have prior auth run a ticket to see what else is cheaper with her insurance

## 2022-03-10 NOTE — Telephone Encounter (Signed)
Pt. Would like to get more samples of Trelgy if poss. Plz call back and let know if we can provide here with more

## 2022-03-11 MED ORDER — TRELEGY ELLIPTA 100-62.5-25 MCG/ACT IN AEPB: 1.0000 | INHALATION_SPRAY | Freq: Every day | RESPIRATORY_TRACT | 0 refills | Status: DC

## 2022-03-11 NOTE — Telephone Encounter (Signed)
Called and spoke with patient. She verbalized understanding. Advised patient I would leave 2 trelegy 100 samples up front for her to pick up today.   Nothing further needed.

## 2022-03-11 NOTE — Telephone Encounter (Signed)
There isn't going to be a less expensive option unless she does assistance plan from company (which may be worth trying). I am ok trying to help her w samples to defray cost

## 2022-03-12 ENCOUNTER — Ambulatory Visit: Payer: HMO | Admitting: Podiatry

## 2022-03-12 ENCOUNTER — Other Ambulatory Visit (HOSPITAL_COMMUNITY): Payer: Self-pay

## 2022-03-12 ENCOUNTER — Other Ambulatory Visit: Payer: Self-pay | Admitting: Cardiology

## 2022-03-12 DIAGNOSIS — I428 Other cardiomyopathies: Secondary | ICD-10-CM

## 2022-03-12 DIAGNOSIS — I10 Essential (primary) hypertension: Secondary | ICD-10-CM

## 2022-03-12 NOTE — Telephone Encounter (Signed)
Called informed patient that we do have samples of Trelegy 19mg available. I have placed 2 samples for pickup in [address]. Patient verbalized understanding and will come pick them up tomorrow.

## 2022-03-13 ENCOUNTER — Other Ambulatory Visit (HOSPITAL_COMMUNITY): Payer: Self-pay

## 2022-03-13 MED ORDER — CANDESARTAN CILEXETIL 8 MG PO TABS
8.0000 mg | ORAL_TABLET | Freq: Every day | ORAL | 3 refills | Status: DC
  Filled 2022-03-13: qty 90, 90d supply, fill #0
  Filled 2022-06-16: qty 90, 90d supply, fill #1
  Filled 2022-09-14: qty 90, 90d supply, fill #2
  Filled 2022-12-09: qty 90, 90d supply, fill #3

## 2022-03-14 ENCOUNTER — Other Ambulatory Visit (HOSPITAL_COMMUNITY): Payer: Self-pay

## 2022-03-18 DIAGNOSIS — M810 Age-related osteoporosis without current pathological fracture: Secondary | ICD-10-CM | POA: Diagnosis not present

## 2022-03-24 ENCOUNTER — Encounter: Payer: Self-pay | Admitting: Podiatry

## 2022-03-24 ENCOUNTER — Ambulatory Visit: Payer: HMO | Admitting: Podiatry

## 2022-03-24 DIAGNOSIS — M0589 Other rheumatoid arthritis with rheumatoid factor of multiple sites: Secondary | ICD-10-CM | POA: Diagnosis not present

## 2022-03-24 DIAGNOSIS — B351 Tinea unguium: Secondary | ICD-10-CM | POA: Diagnosis not present

## 2022-03-24 DIAGNOSIS — M79609 Pain in unspecified limb: Secondary | ICD-10-CM | POA: Diagnosis not present

## 2022-03-24 NOTE — Progress Notes (Signed)
Subjective: Taylor Berry is a 82 y.o. female patient who presents to office today with concern of long,mildly painful nails  while ambulating in shoes; unable to trim.  She has rheumatoid arthritis and this makes it difficult for her to trim her nails.  Patient denies any new cramping, numbness, burning or tingling in the legs or feet.  Patient Active Problem List   Diagnosis Date Noted   Hepatic lesion [id number]   Chronic systolic heart failure (HCC)    Bradycardia 05/23/2021   Postural dizziness with presyncope 05/23/2021   Acute prerenal azotemia 05/23/2021   Cerebral thrombosis with cerebral infarction 01/02/2021   Encephalopathy acute/expressive aphasia  12/30/2020   Hyponatremia 12/30/2020   GERD (gastroesophageal reflux disease) 12/30/2020   Hypocalcemia 12/30/2020   Acute on chronic combined systolic and diastolic CHF (congestive heart failure) ([place]) 04/28/2019   non obstructive CAD 04/28/2019   Edema 04/14/2019   Abnormal CT of the chest 03/22/2018   Acid reflux 07/13/2017   Affective psychosis ([place]) 07/13/2017   Alcohol dependence in remission ([place]) 07/13/2017   BD (Bowen's disease) 07/13/2017   History of colon polyps 07/13/2017   Rheumatoid arthritis ([place]) 07/13/2017   Chronic bronchitis ([place]) 07/13/2017   Secondary pulmonary hypertension 07/13/2017   Obstructive apnea 07/13/2017   Age related osteoporosis 07/13/2017   Non-ischemic cardiomyopathy/diastolic and systolic CHF    Hyperhidrosis 06/18/2016   Anemia in neoplastic disease 05/02/2013   Allergic rhinitis 04/29/2013   Depression 04/29/2013   Lung cancer, left upper lobe 02/03/2013   Pulmonary infiltrates 01/05/2013   Mild pulmonary hypertension ([place]) 02/17/2011   OSA (obstructive sleep apnea) 02/17/2011   Dyspnea on exertion 11/22/2010   COPD (chronic obstructive pulmonary disease) ([place]) 07/20/2009   Carcinoma in situ of cervix uteri 06/26/2009   Pure hypercholesterolemia 06/26/2009   OBESITY  06/26/2009   Chronic disease anemia 06/26/2009   Essential (primary) hypertension 06/26/2009   ARTHRITIS, RHEUMATOID 06/26/2009   OSTEOPOROSIS 06/26/2009   Cough 06/26/2009   Current Outpatient Medications on File Prior to Visit  Medication Sig Dispense Refill   albuterol (VENTOLIN HFA) 108 (90 Base) MCG/ACT inhaler Inhale 2 puffs into the lungs every 4 (four) hours as needed for wheezing or shortness of breath. 18 g 1   atorvastatin (LIPITOR) 40 MG tablet Take 1 tablet (40 mg total) by mouth daily. 60 tablet 5   Calcium Carb-Cholecalciferol (CALCIUM + D3 PO) Take 1 tablet by mouth daily.     CALCIUM-VITAMIN D PO Take 1 tablet by mouth daily.     candesartan (ATACAND) 8 MG tablet Take 1 tablet (8 mg total) by mouth daily. 90 tablet 3   cholestyramine (QUESTRAN) 4 g packet Take 1 packet (4 g total) by mouth 2 (two) times daily. 60 each 11   cholestyramine (QUESTRAN) 4 GM/DOSE powder Take 1 scoop (4 g total) by mouth daily. 378 g 1   ciclopirox (PENLAC) 8 % solution Apply 1 application topically to affected toenails once daily. 6.6 mL 1   clopidogrel (PLAVIX) 75 MG tablet Take 1 tablet (75 mg total) by mouth daily. 30 tablet 6   clopidogrel (PLAVIX) 75 MG tablet Take 1 tablet (75 mg total) by mouth daily. 30 tablet 3   Cyanocobalamin (VITAMIN B12) 1000 MCG TBCR Take 1 tablet by mouth daily.     denosumab (PROLIA) 60 MG/ML SOSY injection Inject 60 mg into the skin every 6 (six) months.     famotidine (PEPCID) 40 MG tablet Take 1 tablet (40 mg total) by mouth  every evening. 30 tablet 5   fluticasone (FLONASE) 50 MCG/ACT nasal spray Shake liquid and Place 2 sprays into both nostrils daily. 16 g 5   Fluticasone-Umeclidin-Vilant (TRELEGY ELLIPTA) 100-62.5-25 MCG/ACT AEPB Inhale 1 puff into the lungs daily. 2 each 0   Fluticasone-Umeclidin-Vilant (TRELEGY ELLIPTA) 100-62.5-25 MCG/ACT AEPB Inhale 1 puff into the lungs daily. 60 each 5   Fluticasone-Umeclidin-Vilant (TRELEGY ELLIPTA) 100-62.5-25  MCG/ACT AEPB Inhale 1 puff into the lungs daily. 60 each 0   furosemide (LASIX) 20 MG tablet Take 1 tablet (20 mg total) by mouth daily. 90 tablet 3   leflunomide (ARAVA) 20 MG tablet Take 20 mg by mouth daily.     meclizine (ANTIVERT) 25 MG tablet Take 1 tablet (25 mg total) by mouth up to 3 (three) times daily as needed for vertigo. 20 tablet 0   METAMUCIL FIBER PO Take by mouth See admin instructions. Mix 1 tablespoonful of powder into 4-8 ounces of desired beverage and drink once a day     No current facility-administered medications on file prior to visit.   Allergies  Allergen Reactions   Latex Itching and Rash   Metoprolol Tartrate Other (See Comments) and Shortness Of Breath   Ace Inhibitors Cough   Angiotensin Receptor Blockers Swelling and Other (See Comments)    Tongue swelling   Fentanyl Other (See Comments)    Hallucinations "I go crazy"      Hydrochlorothiazide Other (See Comments)    Decreased Sodium   Hydroxychloroquine Other (See Comments)    "Decreased sodium"   Penicillamine Nausea And Vomiting   Penicillins Other (See Comments)    Yeast infections Has patient had a PCN reaction causing immediate rash, facial/tongue/throat swelling, SOB or lightheadedness with hypotension:No Has patient had a PCN reaction causing severe rash involving mucus membranes or skin necrosis: No Has patient had a PCN reaction that required hospitalization No Has patient had a PCN reaction occurring within the last 10 years: Yes If all of the above answers are "NO", then may proceed with Cephalosporin use.  Other reaction(s): yeast infection     Objective: General: Patient is awake, alert, and oriented x 3 and in no acute distress.  Integument: Skin is warm, dry and supple bilateral. Nails are tender, long, thickened and  dystrophic with subungual debris, consistent with onychomycosis, 1-5 bilateral. No signs of infection. No open lesions or preulcerative lesions present bilateral.  Remaining integument unremarkable.  Vasculature:  Dorsalis Pedis pulse 2/4 bilateral. Posterior Tibial pulse  1/4 bilateral.  Capillary fill time <3 sec 1-5 bilateral. Positive hair growth to the level of the digits. Temperature gradient within normal limits. No varicosities present bilateral. No edema present bilateral.   Neurology: The patient has intact sensation measured with a 5.07/10g Semmes Weinstein Monofilament at all pedal sites bilateral . Vibratory sensation diminished bilateral with tuning fork. No Babinski sign present bilateral.   Musculoskeletal: No symptomatic pedal deformities noted bilateral. Muscular strength 5/5 in all lower extremity muscular groups bilateral without pain on range of motion . No tenderness with calf compression bilateral.  Assessment and Plan:   ICD-10-CM   1. Pain due to onychomycosis of nail  B35.1    M79.609         -Examined patient. -Mechanically debrided all nails 1-5 bilateral using sterile nail nipper and filed with sterile dremel without incident  -Answered all patient questions -Patient to return  in 2-3 months for continued foot care.  -Patient advised to call the office if any problems or  questions arise in the meantime.  Lorenda Peck, DPM

## 2022-04-09 DIAGNOSIS — H6123 Impacted cerumen, bilateral: Secondary | ICD-10-CM | POA: Diagnosis not present

## 2022-04-09 DIAGNOSIS — H6983 Other specified disorders of Eustachian tube, bilateral: Secondary | ICD-10-CM | POA: Diagnosis not present

## 2022-04-09 DIAGNOSIS — R42 Dizziness and giddiness: Secondary | ICD-10-CM | POA: Diagnosis not present

## 2022-04-09 DIAGNOSIS — J343 Hypertrophy of nasal turbinates: Secondary | ICD-10-CM | POA: Diagnosis not present

## 2022-04-09 DIAGNOSIS — H6521 Chronic serous otitis media, right ear: Secondary | ICD-10-CM | POA: Diagnosis not present

## 2022-04-09 DIAGNOSIS — H9011 Conductive hearing loss, unilateral, right ear, with unrestricted hearing on the contralateral side: Secondary | ICD-10-CM | POA: Diagnosis not present

## 2022-04-09 DIAGNOSIS — J310 Chronic rhinitis: Secondary | ICD-10-CM | POA: Diagnosis not present

## 2022-04-10 ENCOUNTER — Other Ambulatory Visit (HOSPITAL_COMMUNITY): Payer: Self-pay

## 2022-04-10 ENCOUNTER — Other Ambulatory Visit: Payer: Self-pay

## 2022-04-10 MED ORDER — LEFLUNOMIDE 20 MG PO TABS
20.0000 mg | ORAL_TABLET | Freq: Every day | ORAL | 1 refills | Status: DC
  Filled 2022-04-10: qty 90, 90d supply, fill #0

## 2022-04-14 DIAGNOSIS — H6981 Other specified disorders of Eustachian tube, right ear: Secondary | ICD-10-CM | POA: Diagnosis not present

## 2022-04-14 DIAGNOSIS — H6521 Chronic serous otitis media, right ear: Secondary | ICD-10-CM | POA: Diagnosis not present

## 2022-04-14 DIAGNOSIS — H9011 Conductive hearing loss, unilateral, right ear, with unrestricted hearing on the contralateral side: Secondary | ICD-10-CM | POA: Diagnosis not present

## 2022-04-22 DIAGNOSIS — R42 Dizziness and giddiness: Secondary | ICD-10-CM | POA: Diagnosis not present

## 2022-04-23 ENCOUNTER — Other Ambulatory Visit (HOSPITAL_COMMUNITY): Payer: Self-pay

## 2022-05-01 ENCOUNTER — Other Ambulatory Visit: Payer: Self-pay

## 2022-05-01 DIAGNOSIS — H9011 Conductive hearing loss, unilateral, right ear, with unrestricted hearing on the contralateral side: Secondary | ICD-10-CM | POA: Diagnosis not present

## 2022-05-01 DIAGNOSIS — H6981 Other specified disorders of Eustachian tube, right ear: Secondary | ICD-10-CM | POA: Diagnosis not present

## 2022-05-01 DIAGNOSIS — H7201 Central perforation of tympanic membrane, right ear: Secondary | ICD-10-CM | POA: Diagnosis not present

## 2022-05-01 DIAGNOSIS — H8112 Benign paroxysmal vertigo, left ear: Secondary | ICD-10-CM | POA: Diagnosis not present

## 2022-05-02 ENCOUNTER — Other Ambulatory Visit: Payer: Self-pay

## 2022-05-05 ENCOUNTER — Other Ambulatory Visit: Payer: Self-pay | Admitting: Family Medicine

## 2022-05-05 ENCOUNTER — Ambulatory Visit
Admission: RE | Admit: 2022-05-05 | Discharge: 2022-05-05 | Disposition: A | Discharge status: Home / Self Care | Payer: HMO | Source: Ambulatory Visit | Attending: Family Medicine | Admitting: Family Medicine

## 2022-05-05 ENCOUNTER — Other Ambulatory Visit: Payer: Self-pay

## 2022-05-05 DIAGNOSIS — D649 Anemia, unspecified: Secondary | ICD-10-CM | POA: Diagnosis not present

## 2022-05-05 DIAGNOSIS — I110 Hypertensive heart disease with heart failure: Secondary | ICD-10-CM | POA: Diagnosis not present

## 2022-05-05 DIAGNOSIS — J449 Chronic obstructive pulmonary disease, unspecified: Secondary | ICD-10-CM | POA: Diagnosis not present

## 2022-05-05 DIAGNOSIS — F39 Unspecified mood [affective] disorder: Secondary | ICD-10-CM | POA: Diagnosis not present

## 2022-05-05 DIAGNOSIS — R0989 Other specified symptoms and signs involving the circulatory and respiratory systems: Secondary | ICD-10-CM

## 2022-05-05 DIAGNOSIS — I5020 Unspecified systolic (congestive) heart failure: Secondary | ICD-10-CM | POA: Diagnosis not present

## 2022-05-05 DIAGNOSIS — E7800 Pure hypercholesterolemia, unspecified: Secondary | ICD-10-CM | POA: Diagnosis not present

## 2022-05-05 DIAGNOSIS — Z1231 Encounter for screening mammogram for malignant neoplasm of breast: Secondary | ICD-10-CM | POA: Diagnosis not present

## 2022-05-05 DIAGNOSIS — M069 Rheumatoid arthritis, unspecified: Secondary | ICD-10-CM | POA: Diagnosis not present

## 2022-05-05 DIAGNOSIS — I700 Atherosclerosis of aorta: Secondary | ICD-10-CM | POA: Diagnosis not present

## 2022-05-05 DIAGNOSIS — D84821 Immunodeficiency due to drugs: Secondary | ICD-10-CM | POA: Diagnosis not present

## 2022-05-05 DIAGNOSIS — I429 Cardiomyopathy, unspecified: Secondary | ICD-10-CM | POA: Diagnosis not present

## 2022-05-05 DIAGNOSIS — I272 Pulmonary hypertension, unspecified: Secondary | ICD-10-CM | POA: Diagnosis not present

## 2022-05-05 DIAGNOSIS — G629 Polyneuropathy, unspecified: Secondary | ICD-10-CM | POA: Diagnosis not present

## 2022-05-06 ENCOUNTER — Other Ambulatory Visit: Payer: Self-pay

## 2022-05-06 LAB — LAB REPORT - SCANNED
A1c: 5.4
EGFR: 88

## 2022-05-07 ENCOUNTER — Other Ambulatory Visit (HOSPITAL_COMMUNITY): Payer: Self-pay

## 2022-05-09 ENCOUNTER — Encounter: Payer: Self-pay | Admitting: *Deleted

## 2022-05-13 DIAGNOSIS — H43813 Vitreous degeneration, bilateral: Secondary | ICD-10-CM | POA: Diagnosis not present

## 2022-05-15 ENCOUNTER — Encounter: Payer: Self-pay | Admitting: Family Medicine

## 2022-05-19 ENCOUNTER — Other Ambulatory Visit: Payer: Self-pay | Admitting: Family Medicine

## 2022-05-19 ENCOUNTER — Telehealth: Payer: Self-pay | Admitting: Allergy and Immunology

## 2022-05-19 ENCOUNTER — Telehealth: Payer: Self-pay | Admitting: Emergency Medicine

## 2022-05-19 DIAGNOSIS — R9389 Abnormal findings on diagnostic imaging of other specified body structures: Secondary | ICD-10-CM

## 2022-05-19 NOTE — Telephone Encounter (Signed)
Patient called and stated can she pick up samples of the medication Trelegy Ellipta Fluticasone-Umeclidin-Vilant (TRELEGY ELLIPTA) 100-62.5-25 MCG/ACT AEPB.    Per note from [place] on May 19, 2022.

## 2022-05-19 NOTE — Telephone Encounter (Signed)
PT wonders if we can leave her Trelegy samples up front. Adv her don not come until we call her.  [phone]

## 2022-05-19 NOTE — Telephone Encounter (Signed)
Patient called and stated can she pick up samples of the medication Trelegy Ellipta Fluticasone-Umeclidin-Vilant (TRELEGY ELLIPTA) 100-62.5-25 MCG/ACT AEPB.

## 2022-05-20 ENCOUNTER — Other Ambulatory Visit (HOSPITAL_COMMUNITY): Payer: Self-pay

## 2022-05-20 ENCOUNTER — Other Ambulatory Visit: Payer: Self-pay | Admitting: Cardiology

## 2022-05-20 ENCOUNTER — Other Ambulatory Visit: Payer: Self-pay

## 2022-05-20 MED ORDER — CLOPIDOGREL BISULFATE 75 MG PO TABS
75.0000 mg | ORAL_TABLET | Freq: Every day | ORAL | 0 refills | Status: DC
  Filled 2022-05-20: qty 90, 90d supply, fill #0

## 2022-05-20 MED ORDER — TRELEGY ELLIPTA 100-62.5-25 MCG/ACT IN AEPB
1.0000 | INHALATION_SPRAY | Freq: Every day | RESPIRATORY_TRACT | 1 refills | Status: DC
  Filled 2022-05-20: qty 60, 30d supply, fill #0

## 2022-05-20 MED ORDER — BREZTRI AEROSPHERE 160-9-4.8 MCG/ACT IN AERO
2.0000 | INHALATION_SPRAY | Freq: Two times a day (BID) | RESPIRATORY_TRACT | 5 refills | Status: DC
  Filled 2022-05-20: qty 21.4, fill #0

## 2022-05-20 MED ORDER — TRELEGY ELLIPTA 100-62.5-25 MCG/ACT IN AEPB: 1.0000 | INHALATION_SPRAY | Freq: Every day | RESPIRATORY_TRACT | 0 refills | Status: DC

## 2022-05-20 NOTE — Telephone Encounter (Signed)
Patient called back for an update on the Trelegy Samples.

## 2022-05-20 NOTE — Telephone Encounter (Signed)
Called and notified patient that one sample would be provided and that she would also need to fill out paperwork for patient assistance program. I also sent in refill for trelegy requesting that the cheapest brand be dispensed.

## 2022-05-20 NOTE — Telephone Encounter (Signed)
Called and spoke w/ pt letting her know I will place 2 samples of Trelegy @ the desk for her. NFN att.

## 2022-05-20 NOTE — Telephone Encounter (Signed)
Called and spoke to patient and she expressed that she would agree to the [facility] and that she would be able to afford the $35.00 per month. I will put the order in and ask pharmacy to hold until the first of June and run through insurance then.

## 2022-05-20 NOTE — Addendum Note (Signed)
Addended by: Robet Leu A on: 05/20/2022 06:05 PM   Modules accepted: Orders

## 2022-05-20 NOTE — Telephone Encounter (Signed)
Patient came by to pick up sample and do the application. The patient doesn't qualify for the assistance program as she has to meet a $600 out of pocket prescription with her insurance. She is only at $130 at this time according to the [employer] representative that I spoke with. Patient is unable to spend $45/month on her inhaler. She is requesting a change in prescription that is affordable & works. Patient states this inhaler really works for her but unaffordable.

## 2022-05-21 ENCOUNTER — Other Ambulatory Visit (HOSPITAL_COMMUNITY): Payer: Self-pay

## 2022-05-21 ENCOUNTER — Other Ambulatory Visit: Payer: Self-pay

## 2022-05-21 DIAGNOSIS — D649 Anemia, unspecified: Secondary | ICD-10-CM | POA: Diagnosis not present

## 2022-05-26 DIAGNOSIS — D649 Anemia, unspecified: Secondary | ICD-10-CM | POA: Diagnosis not present

## 2022-05-27 ENCOUNTER — Encounter: Payer: Self-pay | Admitting: Cardiology

## 2022-05-27 ENCOUNTER — Ambulatory Visit: Payer: PPO | Attending: Cardiology | Admitting: Cardiology

## 2022-05-27 VITALS — BP 114/64 | HR 79 | Ht 62.0 in | Wt 151.2 lb

## 2022-05-27 DIAGNOSIS — I428 Other cardiomyopathies: Secondary | ICD-10-CM | POA: Diagnosis not present

## 2022-05-27 DIAGNOSIS — E7800 Pure hypercholesterolemia, unspecified: Secondary | ICD-10-CM | POA: Diagnosis not present

## 2022-05-27 DIAGNOSIS — R0602 Shortness of breath: Secondary | ICD-10-CM

## 2022-05-27 DIAGNOSIS — I10 Essential (primary) hypertension: Secondary | ICD-10-CM

## 2022-05-27 NOTE — Patient Instructions (Signed)
  Testing/Procedures:  Your physician has requested that you have an echocardiogram. Echocardiography is a painless test that uses sound waves to create images of your heart. It provides your doctor with information about the size and shape of your heart and how well your heart's chambers and valves are working. This procedure takes approximately one hour. There are no restrictions for this procedure. Please do NOT wear cologne, perfume, aftershave, or lotions (deodorant is allowed). Please arrive 15 minutes prior to your appointment time. [address]   Follow-Up: At [facility], you and your health needs are our priority.  As part of our continuing mission to provide you with exceptional heart care, we have created designated Provider Care Teams.  These Care Teams include your primary Cardiologist (physician) and Advanced Practice Providers (APPs -  Physician Assistants and Nurse Practitioners) who all work together to provide you with the care you need, when you need it.  We recommend signing up for the patient portal called "MyChart".  Sign up information is provided on this After Visit Summary.  MyChart is used to connect with patients for Virtual Visits (Telemedicine).  Patients are able to view lab/test results, encounter notes, upcoming appointments, etc.  Non-urgent messages can be sent to your provider as well.   To learn more about what you can do with MyChart, go to [email or web address].    Your next appointment:   6 month(s)  Provider:   Brian Crenshaw, MD      

## 2022-05-27 NOTE — Progress Notes (Signed)
HPI: FU CM. Pt with H/O dyspnea; also with h/o lung ca, COPD and OSA. Cardiac catheterization October 2018 showed mild nonobstructive coronary disease and normal filling pressures. Echocardiogram repeated March 2019 and showed ejection fraction 30-35% and mild left atrial enlargement. Declined ICD previously. Monitor September 2021 showed sinus bradycardia, normal sinus rhythm, occasional PAC, 4 and 6 beats of PAT and occasional PVC. Toprol was decreased due to bradycardia.  Patient had CVA December 2022 likely secondary to small vessel disease. Most recent echocardiogram May 2023 showed apical hypokinesis with ejection fraction 50 to 55%, mild left atrial enlargement, trace aortic insufficiency.  Monitor June 2023 showed sinus rhythm with occasional PAC, brief runs of PAT, occasional PVC and rare couplet.  Seen May 2023 with dizziness and beta-blocker discontinued.  Since last seen she does have some dyspnea on exertion.  No orthopnea, PND, pedal edema, chest pain or syncope.  Current Outpatient Medications  Medication Sig Dispense Refill   albuterol (VENTOLIN HFA) 108 (90 Base) MCG/ACT inhaler Inhale 2 puffs into the lungs every 4 (four) hours as needed for wheezing or shortness of breath. 18 g 1   atorvastatin (LIPITOR) 40 MG tablet Take 1 tablet (40 mg total) by mouth daily. 60 tablet 5   Budeson-Glycopyrrol-Formoterol (BREZTRI AEROSPHERE) 160-9-4.8 MCG/ACT AERO Inhale 2 puffs into the lungs in the morning and at bedtime. 10.7 g 5   Calcium Carb-Cholecalciferol (CALCIUM + D3 PO) Take 1 tablet by mouth daily.     CALCIUM-VITAMIN D PO Take 1 tablet by mouth daily.     candesartan (ATACAND) 8 MG tablet Take 1 tablet (8 mg total) by mouth daily. 90 tablet 3   clopidogrel (PLAVIX) 75 MG tablet Take 1 tablet (75 mg total) by mouth daily. 90 tablet 0   Cyanocobalamin (VITAMIN B12) 1000 MCG TBCR Take 1 tablet by mouth daily.     denosumab (PROLIA) 60 MG/ML SOSY injection Inject 60 mg into the skin  every 6 (six) months.     fluticasone (FLONASE) 50 MCG/ACT nasal spray Shake liquid and Place 2 sprays into both nostrils daily. 16 g 5   Fluticasone-Umeclidin-Vilant (TRELEGY ELLIPTA) 100-62.5-25 MCG/ACT AEPB Inhale 1 puff into the lungs daily. 60 each 1   furosemide (LASIX) 20 MG tablet Take 1 tablet (20 mg total) by mouth daily. 90 tablet 3   meclizine (ANTIVERT) 25 MG tablet Take 1 tablet (25 mg total) by mouth up to 3 (three) times daily as needed for vertigo. 20 tablet 0   METAMUCIL FIBER PO Take by mouth See admin instructions. Mix 1 tablespoonful of powder into 4-8 ounces of desired beverage and drink once a day     No current facility-administered medications for this visit.     Past Medical History:  Diagnosis Date   Abnormal CT scan    Allergic rhinitis    Anemia 2015   after lung surgery   Angio-edema    Complication of anesthesia    Fentanyl allergy, "claustrophobia"   COPD (chronic obstructive pulmonary disease) (HCC)    Depression    Diaphoresis 07/30/2015   Excessive sweating with little exertion   Difficult intravenous access    GERD (gastroesophageal reflux disease)    In the past   Hyperlipidemia    Hypertension    Left bundle branch block    Lung cancer (HCC) 2015   left upper lobe wedge removed-no further tx.   Neuropathy    Tingling in arms, fingers, and feet (Since 06/15/16)   Obesity  OSA (obstructive sleep apnea)    denies.   Osteoarthritis    ra also   Osteoporosis    Recurrent upper respiratory infection (URI)    Rheumatoid arthritis(714.0)    Dr. Dierdre Forth   SOB (shortness of breath) on exertion    Uterine cancer (HCC) 1971   tx surgical    Past Surgical History:  Procedure Laterality Date   ABDOMINAL HYSTERECTOMY     in situ carcinoma partial   CARDIAC CATHETERIZATION  01/27/11   minor non-obs CAD, NL EF, mild pulm HTN   CATARACT EXTRACTION, BILATERAL     last done 12'16-   COLONOSCOPY W/ BIOPSIES AND POLYPECTOMY     COLONOSCOPY WITH  PROPOFOL N/A 02/13/2015   Procedure: COLONOSCOPY WITH PROPOFOL;  Surgeon: Carman Ching, MD;  Location: [facility];  Service: Endoscopy;  Laterality: N/A;   ESOPHAGOGASTRODUODENOSCOPY (EGD) WITH PROPOFOL N/A 09/30/2016   Procedure: ESOPHAGOGASTRODUODENOSCOPY (EGD) WITH PROPOFOL;  Surgeon: Carman Ching, MD;  Location: [facility];  Service: Endoscopy;  Laterality: N/A;   LYMPH NODE DISSECTION Left 04/12/2013   Procedure: LYMPH NODE DISSECTION;  Surgeon: Delight Ovens, MD;  Location[facility] OR;  Service: Thoracic;  Laterality: Left;   RIGHT/LEFT HEART CATH AND CORONARY ANGIOGRAPHY N/A 10/08/2016   Procedure: RIGHT/LEFT HEART CATH AND CORONARY ANGIOGRAPHY;  Surgeon: Kathleene Hazel, MD;  Location: [facility] INVASIVE CV LAB;  Service: Cardiovascular;  Laterality: N/A;   TONSILLECTOMY     VESICOVAGINAL FISTULA CLOSURE W/ TAH  1971   VIDEO ASSISTED THORACOSCOPY (VATS)/THOROCOTOMY Left 04/12/2013   Procedure: VIDEO ASSISTED THORACOSCOPY (VATS)/THOROCOTOMY, WITH LEFT UPPER LOBE WEDGE RESECTION, CHEST WALL BIOPSY ;  Surgeon: Delight Ovens, MD;  Location: [facility] OR;  Service: Thoracic;  Laterality: Left;   VIDEO BRONCHOSCOPY N/A 04/12/2013   Procedure: VIDEO BRONCHOSCOPY;  Surgeon: Delight Ovens, MD;  Location: [facility] OR;  Service: Thoracic;  Laterality: N/A;    Social History   Socioeconomic History   Marital status: Widowed    Spouse name: Not on file   Number of children: 2   Years of education: College   Highest education level: Master's degree (e.g., MA, MS, MEng, MEd, MSW, MBA)  Occupational History   Occupation: Glass blower/designer: [employer]  Tobacco Use   Smoking status: Former    Packs/day: 1.00    Years: 35.00    Additional pack years: 0.00    Total pack years: 35.00    Types: Cigarettes    Quit date: 01/07/1988    Years since quitting: 34.4   Smokeless tobacco: Never  Vaping Use   Vaping Use: Never used  Substance and Sexual Activity   Alcohol use: No    Comment: not since 1988    Drug use: No   Sexual activity: Not Currently  Other Topics Concern   Not on file  Social History Narrative   Not on file   Social Determinants of Health   Financial Resource Strain: Low Risk  (12/06/2019)   Overall Financial Resource Strain (CARDIA)    Difficulty of Paying Living Expenses: Not hard at all  Food Insecurity: No Food Insecurity (12/06/2019)   Hunger Vital Sign    Worried About Running Out of Food in the Last Year: Never true    Ran Out of Food in the Last Year: Never true  Transportation Needs: No Transportation Needs (12/06/2019)   PRAPARE - Administrator, Civil Service (Medical): No    Lack of Transportation (Non-Medical): No  Physical Activity: Inactive (12/06/2019)  Exercise Vital Sign    Days of Exercise per Week: 0 days    Minutes of Exercise per Session: 0 min  Stress: No Stress Concern Present (12/06/2019)   [employer] of Occupational Health - Occupational Stress Questionnaire    Feeling of Stress : Not at all  Social Connections: Moderately Integrated (12/06/2019)   Social Connection and Isolation Panel [NHANES]    Frequency of Communication with Friends and Family: More than three times a week    Frequency of Social Gatherings with Friends and Family: More than three times a week    Attends Religious Services: More than 4 times per year    Active Member of [employer] or Organizations: Yes    Attends Banker Meetings: 1 to 4 times per year    Marital Status: Widowed  Intimate Partner Violence: Not At Risk (12/06/2019)   Humiliation, Afraid, Rape, and Kick questionnaire    Fear of Current or Ex-Partner: No    Emotionally Abused: No    Physically Abused: No    Sexually Abused: No    Family History  Problem Relation Age of Onset   Lung cancer Mother    Coronary artery disease Son    Heart attack Son 76   Arthritis/Rheumatoid Maternal Grandmother    Coronary artery disease Son    Arthritis/Rheumatoid Son     ROS:  Arthralgias but no fevers or chills, productive cough, hemoptysis, dysphasia, odynophagia, melena, hematochezia, dysuria, hematuria, rash, seizure activity, orthopnea, PND, pedal edema, claudication. Remaining systems are negative.  Physical Exam: Well-developed well-nourished in no acute distress.  Skin is warm and dry.  HEENT is normal.  Neck is supple.  Chest dry basilar crackles Cardiovascular exam is regular rate and rhythm.  Abdominal exam nontender or distended. No masses palpated. Extremities show no edema. neuro grossly intact  ECG-normal sinus rhythm at a rate of 79, IVCD, cannot rule out septal infarct, left anterior fascicular block.  Personally reviewed  A/P  1 nonischemic cardiomyopathy-LV function has improved on most recent echocardiogram.  Will repeat. Continue ARB at present dose.  Beta-blocker discontinued previously due to bradycardia.  2 hypertension-patient's blood pressure is controlled.  Continue present medications.  3 hyperlipidemia-continue statin.  4 chronic combined systolic/diastolic congestive heart failure-patient is euvolemic.  She will continue Lasix as needed.  5 history of dyspnea-this is felt to be multifactorial including prior lung resection secondary to lung cancer, obstructive sleep apnea, obesity hypoventilation syndrome and COPD.  She also has a pending CT of her lungs for basilar crackles/rule out interstitial lung disease.  Given dyspnea I will also repeat her echocardiogram to reassess LV function.  6 prior CVA-continue aspirin and Plavix.  Olga Millers, MD

## 2022-06-03 ENCOUNTER — Ambulatory Visit: Payer: HMO | Admitting: Allergy and Immunology

## 2022-06-16 ENCOUNTER — Other Ambulatory Visit: Payer: Self-pay

## 2022-06-16 ENCOUNTER — Other Ambulatory Visit (HOSPITAL_COMMUNITY): Payer: Self-pay

## 2022-06-17 ENCOUNTER — Ambulatory Visit (INDEPENDENT_AMBULATORY_CARE_PROVIDER_SITE_OTHER): Payer: PPO | Admitting: Allergy and Immunology

## 2022-06-17 ENCOUNTER — Encounter: Payer: Self-pay | Admitting: Allergy and Immunology

## 2022-06-17 ENCOUNTER — Other Ambulatory Visit (HOSPITAL_COMMUNITY): Payer: Self-pay

## 2022-06-17 ENCOUNTER — Other Ambulatory Visit: Payer: Self-pay

## 2022-06-17 VITALS — BP 102/82 | HR 67 | Temp 98.0°F | Resp 14 | Ht 62.0 in | Wt 155.0 lb

## 2022-06-17 DIAGNOSIS — J3089 Other allergic rhinitis: Secondary | ICD-10-CM | POA: Diagnosis not present

## 2022-06-17 DIAGNOSIS — J4489 Other specified chronic obstructive pulmonary disease: Secondary | ICD-10-CM

## 2022-06-17 DIAGNOSIS — K219 Gastro-esophageal reflux disease without esophagitis: Secondary | ICD-10-CM

## 2022-06-17 DIAGNOSIS — M069 Rheumatoid arthritis, unspecified: Secondary | ICD-10-CM

## 2022-06-17 MED ORDER — FAMOTIDINE 40 MG PO TABS
40.0000 mg | ORAL_TABLET | Freq: Every day | ORAL | 5 refills | Status: DC
  Filled 2022-06-17: qty 30, 30d supply, fill #0

## 2022-06-17 MED ORDER — TRELEGY ELLIPTA 100-62.5-25 MCG/ACT IN AEPB
1.0000 | INHALATION_SPRAY | Freq: Every day | RESPIRATORY_TRACT | 5 refills | Status: DC
  Filled 2022-06-17: qty 60, 30d supply, fill #0

## 2022-06-17 MED ORDER — ALBUTEROL SULFATE HFA 108 (90 BASE) MCG/ACT IN AERS
2.0000 | INHALATION_SPRAY | RESPIRATORY_TRACT | 1 refills | Status: DC | PRN
  Filled 2022-06-17: qty 6.7, 17d supply, fill #0

## 2022-06-17 MED ORDER — FLUTICASONE PROPIONATE 50 MCG/ACT NA SUSP
1.0000 | Freq: Every day | NASAL | 5 refills | Status: DC
  Filled 2022-06-17: qty 16, 30d supply, fill #0

## 2022-06-17 NOTE — Patient Instructions (Addendum)
  1.  Continue Flonase -1-2 sprays each nostril 1 time per day  2.  Continue Trelegy 100 - 1 inhalation 1 time per day.   3.  If needed:   A. Pro-air HFA 2 inhalations every 4-6 hours.  B. Famotidine 40mg  1 time per day    4.  Return to clinic in 6 months or earlier if problem  5. Plan for fall flu vaccine  6. We will provide Trelegy sample when available  7. Discuss with rheumatologist about anti-CD20 treatment for RA

## 2022-06-17 NOTE — Progress Notes (Signed)
[place] [facility] [place] [facility] [place] [facility] [place] [facility] [place]   Follow-up Note  Referring Provider: Irven Coe, MD Primary Provider: Irven Coe, MD Date of Office Visit: 06/17/2022  Subjective:   Taylor Berry (DOB: 1940/06/30) is a 82 y.o. female who [facility] in re-evaluation of the following:  HPI: Darolyn returns to this clinic in evaluation of COPD/asthma overlap, allergic rhinitis, LPR.  I last saw her in this clinic 03 December 2021.  She believes that her breathing is doing okay at this point in time.  She certainly has exertional shortness of breath but overall feels as though the use of her triple inhaler is working very well and she rarely uses any short acting bronchodilator and she would like to remain on her triple inhaler.  There is an issue with the co-pay of her triple inhaler and we have been trying to give her as many samples as possible.  She has very little problems with her nose at this point in time and occasionally using a nasal steroid.  She informs me that she is really been having a lot of problems with her rheumatoid arthritis and apparently she is not eligible for the anti-TNF biologic agents because of her pre-existing lung cancer and she is using Arava as her only treatment for rheumatoid arthritis and she is getting much worse with her hands and is starting to interfere with her ability to play her musical instrument.  She is going to have a follow-up chest CT scan at some point in the near future because apparently she had some physical findings on her recent physical exam suggesting that there may be a new process ongoing within her chest.  She is going to have a repeat echocardiogram directed by her cardiologist at some point in near future.  Allergies as of 06/17/2022       Reactions   Latex Itching, Rash   Metoprolol Tartrate Other (See Comments), Shortness Of Breath   Ace Inhibitors Cough   Angiotensin  Receptor Blockers Swelling, Other (See Comments)   Tongue swelling   Fentanyl Other (See Comments)   Hallucinations "I go crazy"     Hydrochlorothiazide Other (See Comments)   Decreased Sodium   Hydroxychloroquine Other (See Comments)   "Decreased sodium"   Penicillamine Nausea And Vomiting   Penicillins Other (See Comments)   Yeast infections Has patient had a PCN reaction causing immediate rash, facial/tongue/throat swelling, SOB or lightheadedness with hypotension:No Has patient had a PCN reaction causing severe rash involving mucus membranes or skin necrosis: No Has patient had a PCN reaction that required hospitalization No Has patient had a PCN reaction occurring within the last 10 years: Yes If all of the above answers are "NO", then may proceed with Cephalosporin use. Other reaction(s): yeast infection        Medication List    albuterol 108 (90 Base) MCG/ACT inhaler Commonly known as: VENTOLIN HFA Inhale 2 puffs into the lungs every 4 (four) hours as needed for wheezing or shortness of breath.   atorvastatin 40 MG tablet Commonly known as: LIPITOR Take 1 tablet (40 mg total) by mouth daily.   CALCIUM + D3 PO Take 1 tablet by mouth daily.   CALCIUM-VITAMIN D PO Take 1 tablet by mouth daily.   candesartan 8 MG tablet Commonly known as: ATACAND Take 1 tablet (8 mg total) by mouth daily.   clopidogrel 75 MG tablet Commonly known as: PLAVIX Take 1 tablet (  75 mg total) by mouth daily.   fluticasone 50 MCG/ACT nasal spray Commonly known as: FLONASE Shake liquid and Place 2 sprays into both nostrils daily.   furosemide 20 MG tablet Commonly known as: LASIX Take 1 tablet (20 mg total) by mouth daily.   meclizine 25 MG tablet Commonly known as: ANTIVERT Take 1 tablet (25 mg total) by mouth up to 3 (three) times daily as needed for vertigo.   METAMUCIL FIBER PO Take by mouth See admin instructions. Mix 1 tablespoonful of powder into 4-8 ounces of desired  beverage and drink once a day   Prolia 60 MG/ML Sosy injection Generic drug: denosumab Inject 60 mg into the skin every 6 (six) months.   Trelegy Ellipta 100-62.5-25 MCG/ACT Aepb Generic drug: Fluticasone-Umeclidin-Vilant Inhale 1 puff into the lungs daily.   Vitamin B12 1000 MCG Tbcr Take 1 tablet by mouth daily.      Past Medical History:  Diagnosis Date  . Abnormal CT scan   . Allergic rhinitis   . Anemia 2015   after lung surgery  . Angio-edema   . Complication of anesthesia    Fentanyl allergy, "claustrophobia"  . COPD (chronic obstructive pulmonary disease) (HCC)   . Depression   . Diaphoresis 07/30/2015   Excessive sweating with little exertion  . Difficult intravenous access   . GERD (gastroesophageal reflux disease)    In the past  . Hyperlipidemia   . Hypertension   . Left bundle branch block   . Lung cancer (HCC) 2015   left upper lobe wedge removed-no further tx.  . Neuropathy    Tingling in arms, fingers, and feet (Since 06/15/16)  . Obesity   . OSA (obstructive sleep apnea)    denies.  . Osteoarthritis    ra also  . Osteoporosis   . Recurrent upper respiratory infection (URI)   . Rheumatoid arthritis(714.0)    Dr. Dierdre Forth  . SOB (shortness of breath) on exertion   . Uterine cancer (HCC) 1971   tx surgical    Past Surgical History:  Procedure Laterality Date  . ABDOMINAL HYSTERECTOMY     in situ carcinoma partial  . CARDIAC CATHETERIZATION  01/27/11   minor non-obs CAD, NL EF, mild pulm HTN  . CATARACT EXTRACTION, BILATERAL     last done 12'16-  . COLONOSCOPY W/ BIOPSIES AND POLYPECTOMY    . COLONOSCOPY WITH PROPOFOL N/A 02/13/2015   Procedure: COLONOSCOPY WITH PROPOFOL;  Surgeon: Carman Ching, MD;  Location: [facility];  Service: Endoscopy;  Laterality: N/A;  . ESOPHAGOGASTRODUODENOSCOPY (EGD) WITH PROPOFOL N/A 09/30/2016   Procedure: ESOPHAGOGASTRODUODENOSCOPY (EGD) WITH PROPOFOL;  Surgeon: Carman Ching, MD;  Location: [facility];   Service: Endoscopy;  Laterality: N/A;  . LYMPH NODE DISSECTION Left 04/12/2013   Procedure: LYMPH NODE DISSECTION;  Surgeon: Delight Ovens, MD;  Location: [facility] OR;  Service: Thoracic;  Laterality: Left;  . RIGHT/LEFT HEART CATH AND CORONARY ANGIOGRAPHY N/A 10/08/2016   Procedure: RIGHT/LEFT HEART CATH AND CORONARY ANGIOGRAPHY;  Surgeon: Kathleene Hazel, MD;  Location: [facility];  Service: Cardiovascular;  Laterality: N/A;  . TONSILLECTOMY    . VESICOVAGINAL FISTULA CLOSURE W/ TAH  1971  . VIDEO ASSISTED THORACOSCOPY (VATS)/THOROCOTOMY Left 04/12/2013   Procedure: VIDEO ASSISTED THORACOSCOPY (VATS)/THOROCOTOMY, WITH LEFT UPPER LOBE WEDGE RESECTION, CHEST WALL BIOPSY ;  Surgeon: Delight Ovens, MD;  Location: [facility] OR;  Service: Thoracic;  Laterality: Left;  Marland Kitchen VIDEO BRONCHOSCOPY N/A 04/12/2013   Procedure: VIDEO BRONCHOSCOPY;  Surgeon: Gwenith Daily  Tyrone Sage, MD;  Location: [facility] OR;  Service: Thoracic;  Laterality: N/A;    Review of systems negative except as noted in HPI / PMHx or noted below:  Review of Systems  Constitutional: Negative.   HENT: Negative.    Eyes: Negative.   Respiratory: Negative.    Cardiovascular: Negative.   Gastrointestinal: Negative.   Genitourinary: Negative.   Musculoskeletal: Negative.   Skin: Negative.   Neurological: Negative.   Endo/Heme/Allergies: Negative.   Psychiatric/Behavioral: Negative.       Objective:   Vitals:   06/17/22 1609  Pulse: 67  Temp: 98 F (36.7 C)  SpO2: 100%          Physical Exam Constitutional:      Appearance: She is not diaphoretic.  HENT:     Head: Normocephalic.     Right Ear: Tympanic membrane, ear canal and external ear normal. A PE tube is present.     Left Ear: Tympanic membrane, ear canal and external ear normal.     Nose: Nose normal. No mucosal edema or rhinorrhea.     Mouth/Throat:     Pharynx: Uvula midline. No oropharyngeal exudate.  Eyes:     Conjunctiva/sclera: Conjunctivae normal.  Neck:      Thyroid: No thyromegaly.     Trachea: Trachea normal. No tracheal tenderness or tracheal deviation.  Cardiovascular:     Rate and Rhythm: Normal rate and regular rhythm.     Heart sounds: S1 normal and S2 normal. Murmur (systolic) heard.  Pulmonary:     Effort: No respiratory distress.     Breath sounds: Normal breath sounds. No stridor. No wheezing or rales.  Lymphadenopathy:     Head:     Right side of head: No tonsillar adenopathy.     Left side of head: No tonsillar adenopathy.     Cervical: No cervical adenopathy.  Skin:    Findings: No erythema or rash.     Nails: There is no clubbing.  Neurological:     Mental Status: She is alert.    Diagnostics:    Spirometry was performed and demonstrated an FEV1 of 1.08 at 61 % of predicted.  The patient had an Asthma Control Test with the following results:  .    Assessment and Plan:   No diagnosis found.  Patient Instructions    1.  Continue Flonase -1-2 sprays each nostril 1 time per day  2.  Continue Trelegy 100 - 1 inhalation 1 time per day.    3.  If needed:   A. Pro-air HFA 2 inhalations every 4-6 hours.  B. Famotidine 40mg  1 time per day    4.  Return to clinic in 6 months or earlier if problem  5. Plan for fall flu vaccine  6. We will provide Trelegy sample when available  7. Discuss with rheumatologist about anti-CD20 treatment for RA          Laurette Schimke, MD Allergy / Immunology [facility]

## 2022-06-18 ENCOUNTER — Encounter: Payer: Self-pay | Admitting: Allergy and Immunology

## 2022-06-18 ENCOUNTER — Other Ambulatory Visit (HOSPITAL_COMMUNITY): Payer: Self-pay

## 2022-06-18 ENCOUNTER — Other Ambulatory Visit: Payer: Self-pay

## 2022-06-20 ENCOUNTER — Ambulatory Visit
Admission: RE | Admit: 2022-06-20 | Discharge: 2022-06-20 | Disposition: A | Discharge status: Home / Self Care | Payer: PPO | Source: Ambulatory Visit | Attending: Family Medicine | Admitting: Family Medicine

## 2022-06-20 DIAGNOSIS — J479 Bronchiectasis, uncomplicated: Secondary | ICD-10-CM | POA: Diagnosis not present

## 2022-06-20 DIAGNOSIS — J9811 Atelectasis: Secondary | ICD-10-CM | POA: Diagnosis not present

## 2022-06-20 DIAGNOSIS — R918 Other nonspecific abnormal finding of lung field: Secondary | ICD-10-CM | POA: Diagnosis not present

## 2022-06-20 DIAGNOSIS — R9389 Abnormal findings on diagnostic imaging of other specified body structures: Secondary | ICD-10-CM

## 2022-06-24 ENCOUNTER — Ambulatory Visit (INDEPENDENT_AMBULATORY_CARE_PROVIDER_SITE_OTHER): Payer: PPO | Admitting: Podiatry

## 2022-06-24 ENCOUNTER — Encounter: Payer: Self-pay | Admitting: Podiatry

## 2022-06-24 DIAGNOSIS — M79609 Pain in unspecified limb: Secondary | ICD-10-CM

## 2022-06-24 DIAGNOSIS — B351 Tinea unguium: Secondary | ICD-10-CM | POA: Diagnosis not present

## 2022-06-24 NOTE — Progress Notes (Signed)
Subjective: Taylor Berry is a 82 y.o. female patient who presents to office today with concern of long,mildly painful nails  while ambulating in shoes; unable to trim.  She has rheumatoid arthritis and this makes it difficult for her to trim her nails.  Patient denies any new cramping, numbness, burning or tingling in the legs or feet.  Patient Active Problem List   Diagnosis Date Noted   Hepatic lesion 12/20/2021   Chronic systolic heart failure (HCC)    Bradycardia 05/23/2021   Postural dizziness with presyncope 05/23/2021   Acute prerenal azotemia 05/23/2021   Cerebral thrombosis with cerebral infarction 01/02/2021   Encephalopathy acute/expressive aphasia  12/30/2020   Hyponatremia 12/30/2020   GERD (gastroesophageal reflux disease) 12/30/2020   Hypocalcemia 12/30/2020   Acute on chronic combined systolic and diastolic CHF (congestive heart failure) (HCC) 04/28/2019   non obstructive CAD 04/28/2019   Edema 04/14/2019   Abnormal CT of the chest 03/22/2018   Acid reflux 07/13/2017   Affective psychosis (HCC) 07/13/2017   Alcohol dependence in remission (HCC) 07/13/2017   BD (Bowen's disease) 07/13/2017   History of colon polyps 07/13/2017   Rheumatoid arthritis (HCC) 07/13/2017   Chronic bronchitis (HCC) 07/13/2017   Secondary pulmonary hypertension 07/13/2017   Obstructive apnea 07/13/2017   Age related osteoporosis 07/13/2017   Non-ischemic cardiomyopathy/diastolic and systolic CHF    Hyperhidrosis 16/10/9602   Anemia in neoplastic disease 05/02/2013   Allergic rhinitis 04/29/2013   Depression 04/29/2013   Lung cancer, left upper lobe 02/03/2013   Pulmonary infiltrates 01/05/2013   Mild pulmonary hypertension (HCC) 02/17/2011   OSA (obstructive sleep apnea) 02/17/2011   Dyspnea on exertion 11/22/2010   COPD (chronic obstructive pulmonary disease) (HCC) 07/20/2009   Carcinoma in situ of cervix uteri 06/26/2009   Pure hypercholesterolemia 06/26/2009   OBESITY  06/26/2009   Chronic disease anemia 06/26/2009   Essential (primary) hypertension 06/26/2009   ARTHRITIS, RHEUMATOID 06/26/2009   OSTEOPOROSIS 06/26/2009   Cough 06/26/2009   Current Outpatient Medications on File Prior to Visit  Medication Sig Dispense Refill   albuterol (VENTOLIN HFA) 108 (90 Base) MCG/ACT inhaler Inhale 2 puffs into the lungs every 4 (four) hours as needed for wheezing or shortness of breath. 6.7 g 1   atorvastatin (LIPITOR) 40 MG tablet Take 1 tablet (40 mg total) by mouth daily. 60 tablet 5   Calcium Carb-Cholecalciferol (CALCIUM + D3 PO) Take 1 tablet by mouth daily.     CALCIUM-VITAMIN D PO Take 1 tablet by mouth daily.     candesartan (ATACAND) 8 MG tablet Take 1 tablet (8 mg total) by mouth daily. 90 tablet 3   clopidogrel (PLAVIX) 75 MG tablet Take 1 tablet (75 mg total) by mouth daily. 90 tablet 0   Cyanocobalamin (VITAMIN B12) 1000 MCG TBCR Take 1 tablet by mouth daily.     denosumab (PROLIA) 60 MG/ML SOSY injection Inject 60 mg into the skin every 6 (six) months.     famotidine (PEPCID) 40 MG tablet Take 1 tablet (40 mg total) by mouth daily. 30 tablet 5   fluticasone (FLONASE) 50 MCG/ACT nasal spray Shake liquid and Place 2 sprays into both nostrils daily. 16 g 5   fluticasone (FLONASE) 50 MCG/ACT nasal spray Place 1-2 sprays into both nostrils daily. 16 g 5   Fluticasone-Umeclidin-Vilant (TRELEGY ELLIPTA) 100-62.5-25 MCG/ACT AEPB Inhale 1 puff into the lungs daily. 60 each 5   furosemide (LASIX) 20 MG tablet Take 1 tablet (20 mg total) by mouth daily. 90 tablet  3   meclizine (ANTIVERT) 25 MG tablet Take 1 tablet (25 mg total) by mouth up to 3 (three) times daily as needed for vertigo. 20 tablet 0   METAMUCIL FIBER PO Take by mouth See admin instructions. Mix 1 tablespoonful of powder into 4-8 ounces of desired beverage and drink once a day     No current facility-administered medications on file prior to visit.   Allergies  Allergen Reactions   Latex  Itching and Rash   Metoprolol Tartrate Other (See Comments) and Shortness Of Breath   Ace Inhibitors Cough   Angiotensin Receptor Blockers Swelling and Other (See Comments)    Tongue swelling   Fentanyl Other (See Comments)    Hallucinations "I go crazy"      Hydrochlorothiazide Other (See Comments)    Decreased Sodium   Hydroxychloroquine Other (See Comments)    "Decreased sodium"   Penicillamine Nausea And Vomiting   Penicillins Other (See Comments)    Yeast infections Has patient had a PCN reaction causing immediate rash, facial/tongue/throat swelling, SOB or lightheadedness with hypotension:No Has patient had a PCN reaction causing severe rash involving mucus membranes or skin necrosis: No Has patient had a PCN reaction that required hospitalization No Has patient had a PCN reaction occurring within the last 10 years: Yes If all of the above answers are "NO", then may proceed with Cephalosporin use.  Other reaction(s): yeast infection     Objective: General: Patient is awake, alert, and oriented x 3 and in no acute distress.  Integument: Skin is warm, dry and supple bilateral. Nails are tender, long, thickened and  dystrophic with subungual debris, consistent with onychomycosis, 1-5 bilateral. No signs of infection. No open lesions or preulcerative lesions present bilateral. Remaining integument unremarkable.  Vasculature:  Dorsalis Pedis pulse 2/4 bilateral. Posterior Tibial pulse  1/4 bilateral.  Capillary fill time <3 sec 1-5 bilateral. Positive hair growth to the level of the digits. Temperature gradient within normal limits. No varicosities present bilateral. No edema present bilateral.   Neurology: The patient has intact sensation measured with a 5.07/10g Semmes Weinstein Monofilament at all pedal sites bilateral . Vibratory sensation diminished bilateral with tuning fork. No Babinski sign present bilateral.   Musculoskeletal: No symptomatic pedal deformities noted  bilateral. Muscular strength 5/5 in all lower extremity muscular groups bilateral without pain on range of motion . No tenderness with calf compression bilateral.  Assessment and Plan:   ICD-10-CM   1. Pain due to onychomycosis of nail  B35.1    M79.609          -Examined patient. -Mechanically debrided all nails 1-5 bilateral using sterile nail nipper and filed with sterile dremel without incident  -Answered all patient questions -Patient to return  in 2-3 months for continued foot care.  -Patient advised to call the office if any problems or questions arise in the meantime.  Louann Sjogren, DPM

## 2022-06-25 ENCOUNTER — Other Ambulatory Visit (HOSPITAL_COMMUNITY): Payer: Self-pay

## 2022-06-27 ENCOUNTER — Ambulatory Visit (HOSPITAL_COMMUNITY): Payer: PPO | Attending: Cardiovascular Disease

## 2022-06-27 DIAGNOSIS — R0602 Shortness of breath: Secondary | ICD-10-CM | POA: Insufficient documentation

## 2022-06-27 LAB — ECHOCARDIOGRAM COMPLETE
Area-P 1/2: 3.72 cm2
S' Lateral: 3.1 cm

## 2022-07-03 ENCOUNTER — Telehealth: Payer: Self-pay | Admitting: Emergency Medicine

## 2022-07-04 ENCOUNTER — Telehealth: Payer: Self-pay | Admitting: Cardiology

## 2022-07-04 DIAGNOSIS — R42 Dizziness and giddiness: Secondary | ICD-10-CM | POA: Diagnosis not present

## 2022-07-04 DIAGNOSIS — H7201 Central perforation of tympanic membrane, right ear: Secondary | ICD-10-CM | POA: Diagnosis not present

## 2022-07-04 NOTE — Telephone Encounter (Signed)
Spoke to the pt, her PCP recommended Dr. Jens Som  review her CT Chest results. Will forward to MD and nurse for advise.

## 2022-07-04 NOTE — Telephone Encounter (Signed)
Spoke with pt, Aware of dr crenshaw's recommendations.  °

## 2022-07-04 NOTE — Telephone Encounter (Signed)
  Per MyChart Scheduling message:  I would like for doctor Crenshaw to review the CAT scan results and let me know if he thinks I should be seen.   Patient had CT on 6/14

## 2022-07-08 NOTE — Telephone Encounter (Signed)
There has been no significant change to RML consolidation since April. No mass identified. Stable bronchiectasis and pulmonary nodules  There are no acute/urgent findings that need to be address today, Dr. Delton Coombes can follow-up next week.

## 2022-07-08 NOTE — Telephone Encounter (Signed)
Beth can you  please advise on pts ct results

## 2022-07-09 NOTE — Telephone Encounter (Signed)
Called and spoke with patient. She verbalized understanding of results. Reminded her of her appt next month with RB.   Nothing further needed at time of call.

## 2022-07-11 ENCOUNTER — Other Ambulatory Visit (HOSPITAL_COMMUNITY): Payer: Self-pay

## 2022-07-15 ENCOUNTER — Other Ambulatory Visit (HOSPITAL_COMMUNITY): Payer: Self-pay

## 2022-07-15 ENCOUNTER — Other Ambulatory Visit: Payer: Self-pay

## 2022-07-15 MED ORDER — LEFLUNOMIDE 20 MG PO TABS
20.0000 mg | ORAL_TABLET | Freq: Every day | ORAL | 0 refills | Status: DC
  Filled 2022-07-15: qty 30, 30d supply, fill #0

## 2022-07-16 ENCOUNTER — Other Ambulatory Visit (HOSPITAL_COMMUNITY): Payer: Self-pay

## 2022-07-17 ENCOUNTER — Other Ambulatory Visit: Payer: Self-pay

## 2022-07-17 ENCOUNTER — Other Ambulatory Visit (HOSPITAL_COMMUNITY): Payer: Self-pay

## 2022-08-11 ENCOUNTER — Other Ambulatory Visit (HOSPITAL_COMMUNITY): Payer: Self-pay

## 2022-08-12 ENCOUNTER — Other Ambulatory Visit (HOSPITAL_COMMUNITY): Payer: Self-pay

## 2022-08-12 MED ORDER — LEFLUNOMIDE 20 MG PO TABS
20.0000 mg | ORAL_TABLET | Freq: Every day | ORAL | 0 refills | Status: DC
  Filled 2022-08-12: qty 30, 30d supply, fill #0

## 2022-08-22 DIAGNOSIS — H903 Sensorineural hearing loss, bilateral: Secondary | ICD-10-CM | POA: Diagnosis not present

## 2022-08-24 ENCOUNTER — Other Ambulatory Visit: Payer: Self-pay | Admitting: Cardiology

## 2022-08-25 ENCOUNTER — Other Ambulatory Visit (HOSPITAL_COMMUNITY): Payer: Self-pay

## 2022-08-25 ENCOUNTER — Other Ambulatory Visit: Payer: Self-pay

## 2022-08-25 MED ORDER — CLOPIDOGREL BISULFATE 75 MG PO TABS
75.0000 mg | ORAL_TABLET | Freq: Every day | ORAL | 0 refills | Status: DC
  Filled 2022-08-25: qty 90, 90d supply, fill #0

## 2022-08-26 ENCOUNTER — Encounter: Payer: Self-pay | Admitting: Emergency Medicine

## 2022-08-26 ENCOUNTER — Ambulatory Visit (INDEPENDENT_AMBULATORY_CARE_PROVIDER_SITE_OTHER): Payer: PPO | Admitting: Emergency Medicine

## 2022-08-26 VITALS — BP 106/64 | HR 91 | Ht 62.0 in | Wt 150.6 lb

## 2022-08-26 DIAGNOSIS — J449 Chronic obstructive pulmonary disease, unspecified: Secondary | ICD-10-CM

## 2022-08-26 DIAGNOSIS — R9389 Abnormal findings on diagnostic imaging of other specified body structures: Secondary | ICD-10-CM | POA: Diagnosis not present

## 2022-08-26 NOTE — Patient Instructions (Addendum)
We reviewed your CT chest from June. This is stable compared with your priors. Good news.  We will hold off on ordering a repeat CT scan unless you develop new symptoms of some kind. Continue your Trelegy 1 inhalation once daily.  Rinse and gargle after using. Keep albuterol available to use 2 puffs up to every 4 hours if needed for shortness of breath, chest tightness, wheezing. Follow with Dr. Delton Coombes in 12 months or sooner if you have any problems.

## 2022-08-26 NOTE — Progress Notes (Signed)
HPI:  ROV 12/20/21 --visit for 82 year old woman with a history of obesity and severe COPD, squamous cell lung cancer post left upper lobe wedge resection, OSA which has been untreated.  Also with CAD, RA on Areva and hydroxychloroquine, uterine cancer.  We have been following a medial right lower lobe pulmonary nodule with serial imaging (stable recently but enlarged compared with remote scans).  She was hospitalized in December 2022 with an acute left pontine CVA, then in May with dizziness and presyncope. She is using Trelegy, breathing well with most of the time, some exertional SOB. No cough or sputum. No flares. Never needs her albuterol. Flu and RSV and COVID all up to date.  She has decided that she does not want to have another abdominal MRI.   Most recent CT chest 09/07/2020 reviewed by me> showed stable chronic lung disease with some subpleural reticulation and basilar predominant cylindrical bronchiectasis, chronic right middle lobe volume loss.  There is a right lower lobe paraspinal soft tissue density 1.9 x 1.1 cm that is unchanged going back to 04/2016.  Note was made of a new right hepatic hypodensity  MRI abdomen 11/09/2020 showed a 2.3 x 1.7 cm hepatic lesion with brisk arterial hyperenhancement, question hemangioma versus focal nodular hyperplasia.  Recommendation made to follow   ROV 08/26/2022 --follow-up visit for 82 year old woman.  She has dyspnea in the setting of severe COPD, restrictive lung disease.  She has a history of left upper lobe wedge resection for squamous cell lung cancer, untreated OSA, CAD, rheumatoid arthritis on immunosuppression, uterine cancer, pontine CVA. Most recently we have been following medial right lower lobe pulmonary nodular disease, associated bronchiectasis She is benefiting from Trelegy, has depended largely on samples. She has poor stamina, likely multifactorial. No real cough or sputum production.   CT chest 06/20/2022 reviewed by me showed no  mediastinal or hilar adenopathy, persistent right middle lobe atelectasis with some slight progression compared with 09/2020, diffuse narrowing of the proximal right middle lobe bronchus without any obvious mass.  Some associated calcified pleural and parenchymal scarring, mucous plugging.   EXAM:  Vitals:   08/26/22 1119  BP: 106/64  Pulse: 91  SpO2: 97%  Weight: 150 lb 9.6 oz (68.3 kg)  Height: 5\' 2"  (1.575 m)    Gen: Pleasant, overweight, in no distress,  normal affect  ENT: No lesions,  mouth clear, somewhat dry, oropharynx clear, no postnasal drip  Neck: No JVD, no stridor  Lungs: No use of accessory muscles, few scattered rhonchi, no wheezing.  Cardiovascular: RRR, heart sounds normal, no murmur or gallops, no peripheral edema  Musculoskeletal: No deformities, no cyanosis or clubbing  Neuro: alert, non focal  Skin: Warm, no lesions or rashes     COPD (chronic obstructive pulmonary disease) (HCC) Overall stable and improved on the Trelegy.  Plan to continue.  Cost has been an issue and we have tried to supplement her with samples as able.  No flares.  Minimal albuterol use.  Needs the flu shot in the fall.  Abnormal CT of the chest Right middle lobe airway narrowing and scar.  Stable over many serial CT scans.  I do not believe we need to continue to follow scans unless she has a clinical change.  Reassured her about this today.    Levy Pupa, MD, PhD 08/26/2022, 11:43 AM [facility] [phone] or if no answer [phone]

## 2022-08-26 NOTE — Assessment & Plan Note (Signed)
Overall stable and improved on the Trelegy.  Plan to continue.  Cost has been an issue and we have tried to supplement her with samples as able.  No flares.  Minimal albuterol use.  Needs the flu shot in the fall.

## 2022-08-26 NOTE — Assessment & Plan Note (Signed)
Right middle lobe airway narrowing and scar.  Stable over many serial CT scans.  I do not believe we need to continue to follow scans unless she has a clinical change.  Reassured her about this today.

## 2022-08-29 ENCOUNTER — Other Ambulatory Visit (HOSPITAL_COMMUNITY): Payer: Self-pay

## 2022-08-29 DIAGNOSIS — M25552 Pain in left hip: Secondary | ICD-10-CM | POA: Diagnosis not present

## 2022-08-29 DIAGNOSIS — R531 Weakness: Secondary | ICD-10-CM | POA: Diagnosis not present

## 2022-08-29 DIAGNOSIS — R682 Dry mouth, unspecified: Secondary | ICD-10-CM | POA: Diagnosis not present

## 2022-08-29 DIAGNOSIS — M810 Age-related osteoporosis without current pathological fracture: Secondary | ICD-10-CM | POA: Diagnosis not present

## 2022-08-29 DIAGNOSIS — E663 Overweight: Secondary | ICD-10-CM | POA: Diagnosis not present

## 2022-08-29 DIAGNOSIS — M1991 Primary osteoarthritis, unspecified site: Secondary | ICD-10-CM | POA: Diagnosis not present

## 2022-08-29 DIAGNOSIS — M0589 Other rheumatoid arthritis with rheumatoid factor of multiple sites: Secondary | ICD-10-CM | POA: Diagnosis not present

## 2022-08-29 DIAGNOSIS — Z79899 Other long term (current) drug therapy: Secondary | ICD-10-CM | POA: Diagnosis not present

## 2022-08-29 DIAGNOSIS — Z6826 Body mass index (BMI) 26.0-26.9, adult: Secondary | ICD-10-CM | POA: Diagnosis not present

## 2022-08-29 DIAGNOSIS — R0602 Shortness of breath: Secondary | ICD-10-CM | POA: Diagnosis not present

## 2022-08-29 DIAGNOSIS — M06322 Rheumatoid nodule, left elbow: Secondary | ICD-10-CM | POA: Diagnosis not present

## 2022-08-29 MED ORDER — LEFLUNOMIDE 20 MG PO TABS
20.0000 mg | ORAL_TABLET | Freq: Every day | ORAL | 1 refills | Status: DC
  Filled 2022-08-29 – 2022-09-14 (×2): qty 90, 90d supply, fill #0
  Filled 2022-12-09: qty 90, 90d supply, fill #1

## 2022-08-29 MED ORDER — PREDNISONE 5 MG PO TABS
ORAL_TABLET | ORAL | 3 refills | Status: DC
  Filled 2022-08-29: qty 49, 28d supply, fill #0

## 2022-09-01 ENCOUNTER — Other Ambulatory Visit (HOSPITAL_COMMUNITY): Payer: Self-pay

## 2022-09-01 ENCOUNTER — Other Ambulatory Visit: Payer: Self-pay

## 2022-09-14 ENCOUNTER — Other Ambulatory Visit (HOSPITAL_COMMUNITY): Payer: Self-pay

## 2022-09-15 ENCOUNTER — Other Ambulatory Visit (HOSPITAL_COMMUNITY): Payer: Self-pay

## 2022-09-15 ENCOUNTER — Other Ambulatory Visit: Payer: Self-pay

## 2022-09-16 ENCOUNTER — Other Ambulatory Visit: Payer: Self-pay

## 2022-09-18 ENCOUNTER — Other Ambulatory Visit (HOSPITAL_COMMUNITY): Payer: Self-pay

## 2022-09-29 ENCOUNTER — Ambulatory Visit: Payer: PPO | Admitting: Podiatry

## 2022-09-30 ENCOUNTER — Ambulatory Visit: Payer: PPO | Admitting: Podiatry

## 2022-09-30 ENCOUNTER — Encounter: Payer: Self-pay | Admitting: Podiatry

## 2022-09-30 DIAGNOSIS — B351 Tinea unguium: Secondary | ICD-10-CM | POA: Diagnosis not present

## 2022-09-30 DIAGNOSIS — M79609 Pain in unspecified limb: Secondary | ICD-10-CM | POA: Diagnosis not present

## 2022-09-30 NOTE — Progress Notes (Signed)
Subjective: Taylor Berry is a 82 y.o. female patient who presents to office today with concern of long,mildly painful nails  while ambulating in shoes; unable to trim.  She has rheumatoid arthritis and this makes it difficult for her to trim her nails.  Patient denies any new cramping, numbness, burning or tingling in the legs or feet.  Patient Active Problem List   Diagnosis Date Noted   Hepatic lesion 12/20/2021   Chronic systolic heart failure (HCC)    Bradycardia 05/23/2021   Postural dizziness with presyncope 05/23/2021   Acute prerenal azotemia 05/23/2021   Cerebral thrombosis with cerebral infarction 01/02/2021   Encephalopathy acute/expressive aphasia  12/30/2020   Hyponatremia 12/30/2020   GERD (gastroesophageal reflux disease) 12/30/2020   Hypocalcemia 12/30/2020   Acute on chronic combined systolic and diastolic CHF (congestive heart failure) (HCC) 04/28/2019   non obstructive CAD 04/28/2019   Edema 04/14/2019   Abnormal CT of the chest 03/22/2018   Acid reflux 07/13/2017   Affective psychosis (HCC) 07/13/2017   Alcohol dependence in remission (HCC) 07/13/2017   BD (Bowen's disease) 07/13/2017   History of colon polyps 07/13/2017   Rheumatoid arthritis (HCC) 07/13/2017   Chronic bronchitis (HCC) 07/13/2017   Secondary pulmonary hypertension 07/13/2017   Obstructive apnea 07/13/2017   Age related osteoporosis 07/13/2017   Non-ischemic cardiomyopathy/diastolic and systolic CHF    Hyperhidrosis 47/82/9562   Anemia in neoplastic disease 05/02/2013   Allergic rhinitis 04/29/2013   Depression 04/29/2013   Lung cancer, left upper lobe 02/03/2013   Pulmonary infiltrates 01/05/2013   Mild pulmonary hypertension (HCC) 02/17/2011   OSA (obstructive sleep apnea) 02/17/2011   Dyspnea on exertion 11/22/2010   COPD (chronic obstructive pulmonary disease) (HCC) 07/20/2009   Carcinoma in situ of cervix uteri 06/26/2009   Pure hypercholesterolemia 06/26/2009   OBESITY  06/26/2009   Chronic disease anemia 06/26/2009   Essential (primary) hypertension 06/26/2009   ARTHRITIS, RHEUMATOID 06/26/2009   OSTEOPOROSIS 06/26/2009   Cough 06/26/2009   Current Outpatient Medications on File Prior to Visit  Medication Sig Dispense Refill   albuterol (VENTOLIN HFA) 108 (90 Base) MCG/ACT inhaler Inhale 2 puffs into the lungs every 4 (four) hours as needed for wheezing or shortness of breath. 6.7 g 1   atorvastatin (LIPITOR) 40 MG tablet Take 1 tablet (40 mg total) by mouth daily. 60 tablet 5   Calcium Carb-Cholecalciferol (CALCIUM + D3 PO) Take 1 tablet by mouth daily.     CALCIUM-VITAMIN D PO Take 1 tablet by mouth daily.     candesartan (ATACAND) 8 MG tablet Take 1 tablet (8 mg total) by mouth daily. 90 tablet 3   clopidogrel (PLAVIX) 75 MG tablet Take 1 tablet (75 mg total) by mouth daily. 90 tablet 0   Cyanocobalamin (VITAMIN B12) 1000 MCG TBCR Take 1 tablet by mouth daily.     denosumab (PROLIA) 60 MG/ML SOSY injection Inject 60 mg into the skin every 6 (six) months.     fluticasone (FLONASE) 50 MCG/ACT nasal spray Shake liquid and Place 2 sprays into both nostrils daily. 16 g 5   fluticasone (FLONASE) 50 MCG/ACT nasal spray Place 1-2 sprays into both nostrils daily. 16 g 5   Fluticasone-Umeclidin-Vilant (TRELEGY ELLIPTA) 100-62.5-25 MCG/ACT AEPB Inhale 1 puff into the lungs daily. 60 each 5   furosemide (LASIX) 20 MG tablet Take 1 tablet (20 mg total) by mouth daily. 90 tablet 3   leflunomide (ARAVA) 20 MG tablet Take 1 tablet (20 mg total) by mouth daily. 90 tablet  1   meclizine (ANTIVERT) 25 MG tablet Take 1 tablet (25 mg total) by mouth up to 3 (three) times daily as needed for vertigo. 20 tablet 0   METAMUCIL FIBER PO Take by mouth See admin instructions. Mix 1 tablespoonful of powder into 4-8 ounces of desired beverage and drink once a day     predniSONE (DELTASONE) 5 MG tablet Take 3 tablets by mouth once a day for 7 days, then take 2 tablets once a day for 7  days, then take 1 tablet once a day for 14 days 49 tablet 3   No current facility-administered medications on file prior to visit.   Allergies  Allergen Reactions   Latex Itching and Rash   Metoprolol Tartrate Other (See Comments) and Shortness Of Breath   Ace Inhibitors Cough   Angiotensin Receptor Blockers Swelling and Other (See Comments)    Tongue swelling   Fentanyl Other (See Comments)    Hallucinations "I go crazy"      Hydrochlorothiazide Other (See Comments)    Decreased Sodium   Hydroxychloroquine Other (See Comments)    "Decreased sodium"   Penicillamine Nausea And Vomiting   Penicillins Other (See Comments)    Yeast infections Has patient had a PCN reaction causing immediate rash, facial/tongue/throat swelling, SOB or lightheadedness with hypotension:No Has patient had a PCN reaction causing severe rash involving mucus membranes or skin necrosis: No Has patient had a PCN reaction that required hospitalization No Has patient had a PCN reaction occurring within the last 10 years: Yes If all of the above answers are "NO", then may proceed with Cephalosporin use.  Other reaction(s): yeast infection     Objective: General: Patient is awake, alert, and oriented x 3 and in no acute distress.  Integument: Skin is warm, dry and supple bilateral. Nails are tender, long, thickened and  dystrophic with subungual debris, consistent with onychomycosis, 1-5 bilateral. No signs of infection. No open lesions or preulcerative lesions present bilateral. Remaining integument unremarkable.  Vasculature:  Dorsalis Pedis pulse 2/4 bilateral. Posterior Tibial pulse  1/4 bilateral.  Capillary fill time <3 sec 1-5 bilateral. Positive hair growth to the level of the digits. Temperature gradient within normal limits. No varicosities present bilateral. No edema present bilateral.   Neurology: The patient has intact sensation measured with a 5.07/10g Semmes Weinstein Monofilament at all pedal  sites bilateral . Vibratory sensation diminished bilateral with tuning fork. No Babinski sign present bilateral.   Musculoskeletal: No symptomatic pedal deformities noted bilateral. Muscular strength 5/5 in all lower extremity muscular groups bilateral without pain on range of motion . No tenderness with calf compression bilateral.  Assessment and Plan:   ICD-10-CM   1. Pain due to onychomycosis of nail  B35.1    M79.609          -Examined patient. -Mechanically debrided all nails 1-5 bilateral using sterile nail nipper and filed with sterile dremel without incident  -Answered all patient questions -Patient to return  in 2-3 months for continued foot care.  -Patient advised to call the office if any problems or questions arise in the meantime.  Louann Sjogren, DPM

## 2022-10-03 DIAGNOSIS — H903 Sensorineural hearing loss, bilateral: Secondary | ICD-10-CM | POA: Diagnosis not present

## 2022-10-13 ENCOUNTER — Telehealth: Payer: Self-pay | Admitting: Emergency Medicine

## 2022-10-13 DIAGNOSIS — I700 Atherosclerosis of aorta: Secondary | ICD-10-CM | POA: Diagnosis not present

## 2022-10-13 DIAGNOSIS — R918 Other nonspecific abnormal finding of lung field: Secondary | ICD-10-CM | POA: Diagnosis not present

## 2022-10-13 DIAGNOSIS — R0602 Shortness of breath: Secondary | ICD-10-CM | POA: Diagnosis not present

## 2022-10-13 DIAGNOSIS — J9811 Atelectasis: Secondary | ICD-10-CM | POA: Diagnosis not present

## 2022-10-13 DIAGNOSIS — J441 Chronic obstructive pulmonary disease with (acute) exacerbation: Secondary | ICD-10-CM | POA: Diagnosis not present

## 2022-10-13 NOTE — Telephone Encounter (Signed)
Pt calling stating she has a non-productive cough and wants an appt. Adv no appts. She asked if she should go to [facility]. I said that or her primary care Dr. She hung up on me. NFN.

## 2022-10-22 NOTE — Progress Notes (Signed)
HPI: FU CM. Pt with H/O dyspnea; also with h/o lung ca, COPD and OSA. Cardiac catheterization October 2018 showed mild nonobstructive coronary disease and normal filling pressures. Echocardiogram repeated March 2019 and showed ejection fraction 30-35% and mild left atrial enlargement. Declined ICD previously. Monitor September 2021 showed sinus bradycardia, normal sinus rhythm, occasional PAC, 4 and 6 beats of PAT and occasional PVC. Toprol was decreased due to bradycardia.  Patient had CVA December 2022 likely secondary to small vessel disease. Monitor June 2023 showed sinus rhythm with occasional PAC, brief runs of PAT, occasional PVC and rare couplet.  Seen May 2023 with dizziness and beta-blocker discontinued.  Most recent echocardiogram June 2024 showed normal LV function, grade 1 diastolic dysfunction, mild left atrial enlargement.  Since last seen she has dyspnea with activities unchanged.  No orthopnea, PND, pedal edema, chest pain or syncope.  Current Outpatient Medications  Medication Sig Dispense Refill   albuterol (VENTOLIN HFA) 108 (90 Base) MCG/ACT inhaler Inhale 2 puffs into the lungs every 4 (four) hours as needed for wheezing or shortness of breath. 6.7 g 1   atorvastatin (LIPITOR) 40 MG tablet Take 1 tablet (40 mg total) by mouth daily. 60 tablet 5   Calcium Carb-Cholecalciferol (CALCIUM + D3 PO) Take 1 tablet by mouth daily.     CALCIUM-VITAMIN D PO Take 1 tablet by mouth daily.     candesartan (ATACAND) 8 MG tablet Take 1 tablet (8 mg total) by mouth daily. 90 tablet 3   cholestyramine (QUESTRAN) 4 GM/DOSE powder Take 4 g by mouth daily.     clopidogrel (PLAVIX) 75 MG tablet Take 1 tablet (75 mg total) by mouth daily. 90 tablet 0   Cyanocobalamin (VITAMIN B12) 1000 MCG TBCR Take 1 tablet by mouth daily.     denosumab (PROLIA) 60 MG/ML SOSY injection Inject 60 mg into the skin every 6 (six) months.     fluticasone (FLONASE) 50 MCG/ACT nasal spray Shake liquid and Place 2  sprays into both nostrils daily. 16 g 5   Fluticasone-Umeclidin-Vilant (TRELEGY ELLIPTA) 100-62.5-25 MCG/ACT AEPB Inhale 1 puff into the lungs daily. 60 each 5   leflunomide (ARAVA) 20 MG tablet Take 1 tablet (20 mg total) by mouth daily. 90 tablet 1   meclizine (ANTIVERT) 25 MG tablet Take 1 tablet (25 mg total) by mouth up to 3 (three) times daily as needed for vertigo. 20 tablet 0   METAMUCIL FIBER PO Take by mouth See admin instructions. Mix 1 tablespoonful of powder into 4-8 ounces of desired beverage and drink once a day     predniSONE (DELTASONE) 5 MG tablet Take 3 tablets by mouth once a day for 7 days, then take 2 tablets once a day for 7 days, then take 1 tablet once a day for 14 days 49 tablet 3   fluticasone (FLONASE) 50 MCG/ACT nasal spray Place 1-2 sprays into both nostrils daily. 16 g 5   furosemide (LASIX) 20 MG tablet Take 1 tablet (20 mg total) by mouth daily. 90 tablet 3   No current facility-administered medications for this visit.     Past Medical History:  Diagnosis Date   Abnormal CT scan    Allergic rhinitis    Anemia 2015   after lung surgery   Angio-edema    Complication of anesthesia    Fentanyl allergy, "claustrophobia"   COPD (chronic obstructive pulmonary disease) (HCC)    Depression    Diaphoresis 07/30/2015   Excessive sweating with little exertion  Difficult intravenous access    GERD (gastroesophageal reflux disease)    In the past   Hyperlipidemia    Hypertension    Left bundle branch block    Lung cancer (HCC) 2015   left upper lobe wedge removed-no further tx.   Neuropathy    Tingling in arms, fingers, and feet (Since 06/15/16)   Obesity    OSA (obstructive sleep apnea)    denies.   Osteoarthritis    ra also   Osteoporosis    Recurrent upper respiratory infection (URI)    Rheumatoid arthritis(714.0)    Dr. Dierdre Forth   SOB (shortness of breath) on exertion    Uterine cancer (HCC) 1971   tx surgical    Past Surgical History:   Procedure Laterality Date   ABDOMINAL HYSTERECTOMY     in situ carcinoma partial   CARDIAC CATHETERIZATION  01/27/11   minor non-obs CAD, NL EF, mild pulm HTN   CATARACT EXTRACTION, BILATERAL     last done 12'16-   COLONOSCOPY W/ BIOPSIES AND POLYPECTOMY     COLONOSCOPY WITH PROPOFOL N/A 02/13/2015   Procedure: COLONOSCOPY WITH PROPOFOL;  Surgeon: Carman Ching, MD;  Location: [facility];  Service: Endoscopy;  Laterality: N/A;   ESOPHAGOGASTRODUODENOSCOPY (EGD) WITH PROPOFOL N/A 09/30/2016   Procedure: ESOPHAGOGASTRODUODENOSCOPY (EGD) WITH PROPOFOL;  Surgeon: Carman Ching, MD;  Location: [facility];  Service: Endoscopy;  Laterality: N/A;   LYMPH NODE DISSECTION Left 04/12/2013   Procedure: LYMPH NODE DISSECTION;  Surgeon: Delight Ovens, MD;  Location[facility];  Service: Thoracic;  Laterality: Left;   RIGHT/LEFT HEART CATH AND CORONARY ANGIOGRAPHY N/A 10/08/2016   Procedure: RIGHT/LEFT HEART CATH AND CORONARY ANGIOGRAPHY;  Surgeon: Kathleene Hazel, MD;  Location: [facility];  Service: Cardiovascular;  Laterality: N/A;   TONSILLECTOMY     VESICOVAGINAL FISTULA CLOSURE W/ TAH  1971   VIDEO ASSISTED THORACOSCOPY (VATS)/THOROCOTOMY Left 04/12/2013   Procedure: VIDEO ASSISTED THORACOSCOPY (VATS)/THOROCOTOMY, WITH LEFT UPPER LOBE WEDGE RESECTION, CHEST WALL BIOPSY ;  Surgeon: Delight Ovens, MD;  Location: [facility] OR;  Service: Thoracic;  Laterality: Left;   VIDEO BRONCHOSCOPY N/A 04/12/2013   Procedure: VIDEO BRONCHOSCOPY;  Surgeon: Delight Ovens, MD;  Location: [facility] OR;  Service: Thoracic;  Laterality: N/A;    Social History   Socioeconomic History   Marital status: Widowed    Spouse name: Not on file   Number of children: 2   Years of education: College   Highest education level: Master's degree (e.g., MA, MS, MEng, MEd, MSW, MBA)  Occupational History   Occupation: Glass blower/designer: [employer]  Tobacco Use   Smoking status: Former    Current packs/day: 0.00    Average  packs/day: 1 pack/day for 35.0 years (35.0 ttl pk-yrs)    Types: Cigarettes    Start date: 01/06/1953    Quit date: 01/07/1988    Years since quitting: 34.8   Smokeless tobacco: Never  Vaping Use   Vaping status: Never Used  Substance and Sexual Activity   Alcohol use: No    Comment: not since 1988   Drug use: No   Sexual activity: Not Currently  Other Topics Concern   Not on file  Social History Narrative   Not on file   Social Determinants of Health   Financial Resource Strain: Low Risk  (12/06/2019)   Overall Financial Resource Strain (CARDIA)    Difficulty of Paying Living Expenses: Not hard at all  Food Insecurity: No Food Insecurity (  12/06/2019)   Hunger Vital Sign    Worried About Running Out of Food in the Last Year: Never true    Ran Out of Food in the Last Year: Never true  Transportation Needs: No Transportation Needs (12/06/2019)   PRAPARE - Administrator, Civil Service (Medical): No    Lack of Transportation (Non-Medical): No  Physical Activity: Inactive (12/06/2019)   Exercise Vital Sign    Days of Exercise per Week: 0 days    Minutes of Exercise per Session: 0 min  Stress: No Stress Concern Present (12/06/2019)   [employer] of Occupational Health - Occupational Stress Questionnaire    Feeling of Stress : Not at all  Social Connections: Moderately Integrated (12/06/2019)   Social Connection and Isolation Panel [NHANES]    Frequency of Communication with Friends and Family: More than three times a week    Frequency of Social Gatherings with Friends and Family: More than three times a week    Attends Religious Services: More than 4 times per year    Active Member of [employer] or Organizations: Yes    Attends Banker Meetings: 1 to 4 times per year    Marital Status: Widowed  Intimate Partner Violence: Not At Risk (12/06/2019)   Humiliation, Afraid, Rape, and Kick questionnaire    Fear of Current or Ex-Partner: No    Emotionally  Abused: No    Physically Abused: No    Sexually Abused: No    Family History  Problem Relation Age of Onset   Lung cancer Mother    Coronary artery disease Son    Heart attack Son 64   Arthritis/Rheumatoid Maternal Grandmother    Coronary artery disease Son    Arthritis/Rheumatoid Son     ROS: no fevers or chills, productive cough, hemoptysis, dysphasia, odynophagia, melena, hematochezia, dysuria, hematuria, rash, seizure activity, orthopnea, PND, pedal edema, claudication. Remaining systems are negative.  Physical Exam: Well-developed well-nourished in no acute distress.  Skin is warm and dry.  HEENT is normal.  Neck is supple.  Chest is clear to auscultation with normal expansion.  Cardiovascular exam is regular rate and rhythm.  Abdominal exam nontender or distended. No masses palpated. Extremities show no edema. neuro grossly intact  A/P  1 nonischemic cardiomyopathy-LV function low normal on most recent echocardiogram.  Continue ARB.  Beta-blocker discontinued previously secondary to bradycardia.  2 chronic combined systolic/diastolic congestive heart failure-patient remains euvolemic.  Will continue diuretic as needed.  3 hypertension-blood pressure controlled.  Continue present medical regimen.  4 hyperlipidemia-continue statin.  5 dyspnea-felt to be multifactorial including history of lung cancer status post lung resection, obesity hypoventilation syndrome, COPD and obstructive sleep apnea.  6 history of CVA-continue Plavix.  Olga Millers, MD

## 2022-11-03 ENCOUNTER — Ambulatory Visit: Payer: Self-pay | Admitting: Cardiology

## 2022-11-04 ENCOUNTER — Ambulatory Visit: Payer: PPO | Attending: Cardiology | Admitting: Cardiology

## 2022-11-04 ENCOUNTER — Encounter: Payer: Self-pay | Admitting: Cardiology

## 2022-11-04 ENCOUNTER — Other Ambulatory Visit (HOSPITAL_COMMUNITY): Payer: Self-pay

## 2022-11-04 VITALS — BP 122/62 | HR 69 | Ht 62.0 in | Wt 152.0 lb

## 2022-11-04 DIAGNOSIS — R001 Bradycardia, unspecified: Secondary | ICD-10-CM | POA: Diagnosis not present

## 2022-11-04 DIAGNOSIS — I10 Essential (primary) hypertension: Secondary | ICD-10-CM

## 2022-11-04 DIAGNOSIS — I428 Other cardiomyopathies: Secondary | ICD-10-CM | POA: Diagnosis not present

## 2022-11-04 DIAGNOSIS — E7800 Pure hypercholesterolemia, unspecified: Secondary | ICD-10-CM

## 2022-11-04 MED ORDER — ATORVASTATIN CALCIUM 40 MG PO TABS
40.0000 mg | ORAL_TABLET | Freq: Every day | ORAL | 3 refills | Status: DC
  Filled 2022-11-04: qty 90, 90d supply, fill #0
  Filled 2023-01-30: qty 90, 90d supply, fill #1
  Filled 2023-05-01 – 2023-05-05 (×3): qty 90, 90d supply, fill #2
  Filled 2023-07-31: qty 90, 90d supply, fill #3

## 2022-11-04 NOTE — Patient Instructions (Signed)
Medication Instructions:  Refill for Lipitor sent to [facility] pharmacy for pick up today *If you need a refill on your cardiac medications before your next appointment, please call your pharmacy*     Follow-Up: At [facility], you and your health needs are our priority.  As part of our continuing mission to provide you with exceptional heart care, we have created designated Provider Care Teams.  These Care Teams include your primary Cardiologist (physician) and Advanced Practice Providers (APPs -  Physician Assistants and Nurse Practitioners) who all work together to provide you with the care you need, when you need it.  We recommend signing up for the patient portal called "MyChart".  Sign up information is provided on this After Visit Summary.  MyChart is used to connect with patients for Virtual Visits (Telemedicine).  Patients are able to view lab/test results, encounter notes, upcoming appointments, etc.  Non-urgent messages can be sent to your provider as well.   To learn more about what you can do with MyChart, go to [email or web address].    Your next appointment:   12 month(s)  Provider:   Olga Millers, MD

## 2022-11-05 DIAGNOSIS — F39 Unspecified mood [affective] disorder: Secondary | ICD-10-CM | POA: Diagnosis not present

## 2022-11-05 DIAGNOSIS — I110 Hypertensive heart disease with heart failure: Secondary | ICD-10-CM | POA: Diagnosis not present

## 2022-11-05 DIAGNOSIS — J449 Chronic obstructive pulmonary disease, unspecified: Secondary | ICD-10-CM | POA: Diagnosis not present

## 2022-11-05 DIAGNOSIS — E559 Vitamin D deficiency, unspecified: Secondary | ICD-10-CM | POA: Diagnosis not present

## 2022-11-05 DIAGNOSIS — Z0000 Encounter for general adult medical examination without abnormal findings: Secondary | ICD-10-CM | POA: Diagnosis not present

## 2022-11-05 DIAGNOSIS — M810 Age-related osteoporosis without current pathological fracture: Secondary | ICD-10-CM | POA: Diagnosis not present

## 2022-11-05 DIAGNOSIS — E7800 Pure hypercholesterolemia, unspecified: Secondary | ICD-10-CM | POA: Diagnosis not present

## 2022-11-05 DIAGNOSIS — I5020 Unspecified systolic (congestive) heart failure: Secondary | ICD-10-CM | POA: Diagnosis not present

## 2022-11-05 DIAGNOSIS — D649 Anemia, unspecified: Secondary | ICD-10-CM | POA: Diagnosis not present

## 2022-11-05 DIAGNOSIS — I700 Atherosclerosis of aorta: Secondary | ICD-10-CM | POA: Diagnosis not present

## 2022-11-05 DIAGNOSIS — M069 Rheumatoid arthritis, unspecified: Secondary | ICD-10-CM | POA: Diagnosis not present

## 2022-11-05 DIAGNOSIS — I428 Other cardiomyopathies: Secondary | ICD-10-CM | POA: Diagnosis not present

## 2022-11-10 ENCOUNTER — Telehealth: Payer: Self-pay | Admitting: Emergency Medicine

## 2022-11-10 NOTE — Telephone Encounter (Signed)
PT was told by Dr. Delton Coombes to call when she needed samples Trelegy. Adv DO NOT come in, Wait for our call back @ [phone]

## 2022-11-11 ENCOUNTER — Telehealth: Payer: Self-pay

## 2022-11-11 NOTE — Telephone Encounter (Signed)
Patient came and picked up samples

## 2022-11-11 NOTE — Telephone Encounter (Signed)
Patient called requesting samples of Trelegy 100. Patient states Dr. Lucie Leather said in her last office visit that she can request for samples when needed.   It looks like she called her pulmonary provider requesting for samples also.

## 2022-11-11 NOTE — Telephone Encounter (Signed)
Called patient back - DOB verified - asked why she never picked up [facility] in May.   Patient stated she wasn't aware - advised/reviewed 05/20/22 telephone encounter with patient - asked if she was able to afford the $35 mthly co-pay - patient stated she guess she is saying she's not.   Patient advised Trelegy 100/62.5/25 mcg samples w/coupon will be ready for p/u - [address] side.  Patient verbalized understanding, no further questions.

## 2022-11-12 NOTE — Telephone Encounter (Signed)
PT calling again on samples.

## 2022-11-18 ENCOUNTER — Other Ambulatory Visit (HOSPITAL_COMMUNITY): Payer: Self-pay

## 2022-11-18 ENCOUNTER — Other Ambulatory Visit: Payer: Self-pay | Admitting: Cardiology

## 2022-11-18 MED ORDER — CLOPIDOGREL BISULFATE 75 MG PO TABS
75.0000 mg | ORAL_TABLET | Freq: Every day | ORAL | 3 refills | Status: DC
  Filled 2022-11-18: qty 90, 90d supply, fill #0
  Filled 2023-02-20: qty 90, 90d supply, fill #1
  Filled 2023-05-29: qty 90, 90d supply, fill #2
  Filled 2023-08-22: qty 90, 90d supply, fill #3

## 2022-11-18 MED ORDER — TRELEGY ELLIPTA 100-62.5-25 MCG/ACT IN AEPB: 1.0000 | INHALATION_SPRAY | Freq: Every day | RESPIRATORY_TRACT | Status: DC

## 2022-11-18 NOTE — Telephone Encounter (Signed)
Pt still has not heard anything about getting any samples. Please advise

## 2022-11-18 NOTE — Telephone Encounter (Signed)
Spoke with the pt  She is unable to afford trelegy and states [facility] told her she could have samples when needed  She did not meet qualifications for pt assistance  2 samples up front for pick up

## 2022-11-19 DIAGNOSIS — L821 Other seborrheic keratosis: Secondary | ICD-10-CM | POA: Diagnosis not present

## 2022-11-19 DIAGNOSIS — D225 Melanocytic nevi of trunk: Secondary | ICD-10-CM | POA: Diagnosis not present

## 2022-11-19 DIAGNOSIS — B353 Tinea pedis: Secondary | ICD-10-CM | POA: Diagnosis not present

## 2022-11-19 DIAGNOSIS — Z85828 Personal history of other malignant neoplasm of skin: Secondary | ICD-10-CM | POA: Diagnosis not present

## 2022-11-24 DIAGNOSIS — M0589 Other rheumatoid arthritis with rheumatoid factor of multiple sites: Secondary | ICD-10-CM | POA: Diagnosis not present

## 2022-12-01 DIAGNOSIS — R682 Dry mouth, unspecified: Secondary | ICD-10-CM | POA: Diagnosis not present

## 2022-12-01 DIAGNOSIS — E663 Overweight: Secondary | ICD-10-CM | POA: Diagnosis not present

## 2022-12-01 DIAGNOSIS — M1991 Primary osteoarthritis, unspecified site: Secondary | ICD-10-CM | POA: Diagnosis not present

## 2022-12-01 DIAGNOSIS — M06322 Rheumatoid nodule, left elbow: Secondary | ICD-10-CM | POA: Diagnosis not present

## 2022-12-01 DIAGNOSIS — Z79899 Other long term (current) drug therapy: Secondary | ICD-10-CM | POA: Diagnosis not present

## 2022-12-01 DIAGNOSIS — M810 Age-related osteoporosis without current pathological fracture: Secondary | ICD-10-CM | POA: Diagnosis not present

## 2022-12-01 DIAGNOSIS — M0589 Other rheumatoid arthritis with rheumatoid factor of multiple sites: Secondary | ICD-10-CM | POA: Diagnosis not present

## 2022-12-01 DIAGNOSIS — R0602 Shortness of breath: Secondary | ICD-10-CM | POA: Diagnosis not present

## 2022-12-01 DIAGNOSIS — R531 Weakness: Secondary | ICD-10-CM | POA: Diagnosis not present

## 2022-12-01 DIAGNOSIS — Z6827 Body mass index (BMI) 27.0-27.9, adult: Secondary | ICD-10-CM | POA: Diagnosis not present

## 2022-12-09 ENCOUNTER — Other Ambulatory Visit (HOSPITAL_COMMUNITY): Payer: Self-pay

## 2022-12-09 ENCOUNTER — Ambulatory Visit: Payer: HMO | Admitting: Cardiology

## 2022-12-09 DIAGNOSIS — M25561 Pain in right knee: Secondary | ICD-10-CM | POA: Diagnosis not present

## 2022-12-09 DIAGNOSIS — M25562 Pain in left knee: Secondary | ICD-10-CM | POA: Diagnosis not present

## 2022-12-10 ENCOUNTER — Other Ambulatory Visit: Payer: Self-pay

## 2022-12-10 ENCOUNTER — Other Ambulatory Visit (HOSPITAL_COMMUNITY): Payer: Self-pay

## 2022-12-16 ENCOUNTER — Encounter: Payer: Self-pay | Admitting: Allergy and Immunology

## 2022-12-16 ENCOUNTER — Other Ambulatory Visit: Payer: Self-pay

## 2022-12-16 ENCOUNTER — Ambulatory Visit: Payer: PPO | Admitting: Allergy and Immunology

## 2022-12-16 ENCOUNTER — Other Ambulatory Visit (HOSPITAL_COMMUNITY): Payer: Self-pay

## 2022-12-16 VITALS — BP 120/68 | HR 65 | Temp 97.3°F | Resp 16 | Ht 62.0 in | Wt 153.4 lb

## 2022-12-16 DIAGNOSIS — K219 Gastro-esophageal reflux disease without esophagitis: Secondary | ICD-10-CM

## 2022-12-16 DIAGNOSIS — J4489 Other specified chronic obstructive pulmonary disease: Secondary | ICD-10-CM

## 2022-12-16 DIAGNOSIS — J3089 Other allergic rhinitis: Secondary | ICD-10-CM

## 2022-12-16 DIAGNOSIS — M069 Rheumatoid arthritis, unspecified: Secondary | ICD-10-CM

## 2022-12-16 MED ORDER — MELOXICAM 7.5 MG PO TABS
7.5000 mg | ORAL_TABLET | Freq: Every day | ORAL | 3 refills | Status: DC
  Filled 2022-12-16 (×2): qty 60, 60d supply, fill #0

## 2022-12-16 MED ORDER — ALBUTEROL SULFATE HFA 108 (90 BASE) MCG/ACT IN AERS
2.0000 | INHALATION_SPRAY | RESPIRATORY_TRACT | 1 refills | Status: DC | PRN
  Filled 2022-12-16 (×3): qty 6.7, 17d supply, fill #0

## 2022-12-16 MED ORDER — FLUTICASONE PROPIONATE 50 MCG/ACT NA SUSP
1.0000 | Freq: Every day | NASAL | 5 refills | Status: DC
  Filled 2022-12-16 (×3): qty 16, 30d supply, fill #0

## 2022-12-16 MED ORDER — TRELEGY ELLIPTA 100-62.5-25 MCG/ACT IN AEPB
1.0000 | INHALATION_SPRAY | Freq: Every day | RESPIRATORY_TRACT | 5 refills | Status: DC
  Filled 2022-12-16 (×3): qty 60, 30d supply, fill #0

## 2022-12-16 NOTE — Progress Notes (Unsigned)
[place] [facility] [place] [facility] [place] [facility] [place] -    Follow-up Note  Referring Provider: Irven Coe, MD Primary Provider: Irven Coe, MD Date of Office Visit: 12/16/2022  Subjective:   Taylor Berry (DOB: 1940-04-09) is a 82 y.o. female who [facility] in re-evaluation of the following:  HPI: Taylor Berry returns to this clinic in evaluation of COPD/asthma overlap, allergic rhinitis, LPR.  I last saw her in this clinic 17 June 2022.  Overall she thinks that she is doing pretty well at this point in time while consistently using her trilogy with clinic samples.  Rarely does she need a short acting bronchodilator.  She does not really exert herself to any significant degree.  Should be noted that she was treated with prednisone for her RA in September and received injections in her knees since have last seen her in this clinic.  She has had no issues with reflux and is not treating that issue at this point in time.  She does inform me that she has had significant problems with her rheumatoid arthritis even though she is continuing to utilize therapy prescribed by Dr. Dierdre Forth, rheumatology.  She does take an ibuprofen twice a day which actually does help her but she is scared about going up on the dose.  Allergies as of 12/16/2022       Reactions   Latex Itching, Rash   Metoprolol Tartrate Other (See Comments), Shortness Of Breath   Ace Inhibitors Cough   Angiotensin Receptor Blockers Swelling, Other (See Comments)   Tongue swelling   Fentanyl Other (See Comments)   Hallucinations "I go crazy"     Guaifenesin Er Diarrhea   Hydrochlorothiazide Other (See Comments)   Decreased Sodium   Hydroxychloroquine Other (See Comments)   "Decreased sodium"   Penicillamine Nausea And Vomiting   Penicillins Other (See Comments)   Yeast infections Has patient had a PCN reaction causing immediate rash, facial/tongue/throat swelling, SOB or  lightheadedness with hypotension:No Has patient had a PCN reaction causing severe rash involving mucus membranes or skin necrosis: No Has patient had a PCN reaction that required hospitalization No Has patient had a PCN reaction occurring within the last 10 years: Yes If all of the above answers are "NO", then may proceed with Cephalosporin use. Other reaction(s): yeast infection        Medication List    albuterol 108 (90 Base) MCG/ACT inhaler Commonly known as: VENTOLIN HFA Inhale 2 puffs into the lungs every 4 (four) hours as needed for wheezing or shortness of breath.   atorvastatin 40 MG tablet Commonly known as: LIPITOR Take 1 tablet (40 mg total) by mouth daily.   CALCIUM + D3 PO Take 1 tablet by mouth daily.   CALCIUM-VITAMIN D PO Take 1 tablet by mouth daily.   candesartan 8 MG tablet Commonly known as: ATACAND Take 1 tablet (8 mg total) by mouth daily.   cholestyramine 4 GM/DOSE powder Commonly known as: QUESTRAN Take 4 g by mouth daily.   clopidogrel 75 MG tablet Commonly known as: PLAVIX Take 1 tablet (75 mg total) by mouth daily.   colestipol 1 g tablet Commonly known as: COLESTID Take 2 g by mouth daily.   fluticasone 50 MCG/ACT nasal spray Commonly known as: FLONASE Place 1-2 sprays into both nostrils daily. What changed: Another medication with the same name was changed. Make sure you understand how and when to take each. Changed by: Nykeria Mealing J Zhana Jeangilles  fluticasone 50 MCG/ACT nasal spray Commonly known as: FLONASE Place 1-2 sprays into both nostrils daily. What changed: how much to take Changed by: Latrena Benegas J Ellinor Test   furosemide 20 MG tablet Commonly known as: LASIX Take 1 tablet (20 mg total) by mouth daily.   leflunomide 20 MG tablet Commonly known as: ARAVA Take 1 tablet (20 mg total) by mouth daily.   meclizine 25 MG tablet Commonly known as: ANTIVERT Take 1 tablet (25 mg total) by mouth up to 3 (three) times daily as needed for vertigo.    meloxicam 7.5 MG tablet Commonly known as: Mobic Take 1 tablet (7.5 mg total) by mouth daily. Started by: Teryl Mcconaghy J Vertis Bauder   METAMUCIL FIBER PO Take by mouth See admin instructions. Mix 1 tablespoonful of powder into 4-8 ounces of desired beverage and drink once a day   predniSONE 5 MG tablet Commonly known as: DELTASONE Take 3 tablets by mouth once a day for 7 days, then take 2 tablets once a day for 7 days, then take 1 tablet once a day for 14 days   Prolia 60 MG/ML Sosy injection Generic drug: denosumab Inject 60 mg into the skin every 6 (six) months.   Trelegy Ellipta 100-62.5-25 MCG/ACT Aepb Generic drug: Fluticasone-Umeclidin-Vilant Inhale 1 puff into the lungs daily.   Vitamin B12 1000 MCG Tbcr Take 1 tablet by mouth daily.        Past Medical History:  Diagnosis Date   Abnormal CT scan    Allergic rhinitis    Anemia 2015   after lung surgery   Angio-edema    Complication of anesthesia    Fentanyl allergy, "claustrophobia"   COPD (chronic obstructive pulmonary disease) (HCC)    Depression    Diaphoresis 07/30/2015   Excessive sweating with little exertion   Difficult intravenous access    GERD (gastroesophageal reflux disease)    In the past   Hyperlipidemia    Hypertension    Left bundle branch block    Lung cancer (HCC) 2015   left upper lobe wedge removed-no further tx.   Neuropathy    Tingling in arms, fingers, and feet (Since 06/15/16)   Obesity    OSA (obstructive sleep apnea)    denies.   Osteoarthritis    ra also   Osteoporosis    Recurrent upper respiratory infection (URI)    Rheumatoid arthritis(714.0)    Dr. Dierdre Forth   SOB (shortness of breath) on exertion    Uterine cancer (HCC) 1971   tx surgical    Past Surgical History:  Procedure Laterality Date   ABDOMINAL HYSTERECTOMY     in situ carcinoma partial   CARDIAC CATHETERIZATION  01/27/11   minor non-obs CAD, NL EF, mild pulm HTN   CATARACT EXTRACTION, BILATERAL     last done  12'16-   COLONOSCOPY W/ BIOPSIES AND POLYPECTOMY     COLONOSCOPY WITH PROPOFOL N/A 02/13/2015   Procedure: COLONOSCOPY WITH PROPOFOL;  Surgeon: Carman Ching, MD;  Location: [facility];  Service: Endoscopy;  Laterality: N/A;   ESOPHAGOGASTRODUODENOSCOPY (EGD) WITH PROPOFOL N/A 09/30/2016   Procedure: ESOPHAGOGASTRODUODENOSCOPY (EGD) WITH PROPOFOL;  Surgeon: Carman Ching, MD;  Location: [facility];  Service: Endoscopy;  Laterality: N/A;   LYMPH NODE DISSECTION Left 04/12/2013   Procedure: LYMPH NODE DISSECTION;  Surgeon: Delight Ovens, MD;  Location: [facility] OR;  Service: Thoracic;  Laterality: Left;   RIGHT/LEFT HEART CATH AND CORONARY ANGIOGRAPHY N/A 10/08/2016   Procedure: RIGHT/LEFT HEART CATH AND CORONARY ANGIOGRAPHY;  Surgeon:  Kathleene Hazel, MD;  Location: [facility] INVASIVE CV LAB;  Service: Cardiovascular;  Laterality: N/A;   TONSILLECTOMY     VESICOVAGINAL FISTULA CLOSURE W/ TAH  1971   VIDEO ASSISTED THORACOSCOPY (VATS)/THOROCOTOMY Left 04/12/2013   Procedure: VIDEO ASSISTED THORACOSCOPY (VATS)/THOROCOTOMY, WITH LEFT UPPER LOBE WEDGE RESECTION, CHEST WALL BIOPSY ;  Surgeon: Delight Ovens, MD;  Location: [facility] OR;  Service: Thoracic;  Laterality: Left;   VIDEO BRONCHOSCOPY N/A 04/12/2013   Procedure: VIDEO BRONCHOSCOPY;  Surgeon: Delight Ovens, MD;  Location: [facility] OR;  Service: Thoracic;  Laterality: N/A;    Review of systems negative except as noted in HPI / PMHx or noted below:  Review of Systems  Constitutional: Negative.   HENT: Negative.    Eyes: Negative.   Respiratory: Negative.    Cardiovascular: Negative.   Gastrointestinal: Negative.   Genitourinary: Negative.   Musculoskeletal: Negative.   Skin: Negative.   Neurological: Negative.   Endo/Heme/Allergies: Negative.   Psychiatric/Behavioral: Negative.       Objective:   Vitals:   12/16/22 0926  BP: 120/68  Pulse: 65  Resp: 16  Temp: (!) 97.3 F (36.3 C)  SpO2: 99%   Height: 5\' 2"  (157.5 cm)  Weight:  153 lb 6.4 oz (69.6 kg)   Physical Exam Constitutional:      Appearance: She is not diaphoretic.  HENT:     Head: Normocephalic.     Right Ear: Tympanic membrane, ear canal and external ear normal.     Left Ear: Tympanic membrane, ear canal and external ear normal.     Nose: Nose normal. No mucosal edema or rhinorrhea.     Mouth/Throat:     Pharynx: Uvula midline. No oropharyngeal exudate.  Eyes:     Conjunctiva/sclera: Conjunctivae normal.  Neck:     Thyroid: No thyromegaly.     Trachea: Trachea normal. No tracheal tenderness or tracheal deviation.  Cardiovascular:     Rate and Rhythm: Normal rate and regular rhythm.     Heart sounds: Normal heart sounds, S1 normal and S2 normal. No murmur heard. Pulmonary:     Effort: No respiratory distress.     Breath sounds: No stridor. Wheezing (Inspiratory crackles, expiratory wheezes scattered throughout lung) present. No rales.  Lymphadenopathy:     Head:     Right side of head: No tonsillar adenopathy.     Left side of head: No tonsillar adenopathy.     Cervical: No cervical adenopathy.  Skin:    Findings: No erythema or rash.     Nails: There is no clubbing.  Neurological:     Mental Status: She is alert.     Diagnostics:    Spirometry was performed and demonstrated an FEV1 of 0.93 at 53 % of predicted.  The patient had an Asthma Control Test with the following results: ACT Total Score: 24.    Results of a chest CT scan obtained 20 June 2022 identified the following:  Cardiovascular: Atheromatous calcifications, including the coronary arteries and aorta. Borderline enlarged heart. Enlarged pulmonary arteries, especially the right and left main pulmonary arteries. The main pulmonary artery transverse diameter is 3.0 cm. Minimal pericardial fluid.   Mediastinum/Nodes: No enlarged mediastinal or axillary lymph nodes. Thyroid gland, trachea, and esophagus demonstrate no significant findings.   Lungs/Pleura: Consolidative  right middle lobe atelectasis with progression since 09/07/2020 and without significant change since the radiographs dated 05/05/2022. There is associated marked diffuse narrowing of the proximal right middle lobe bronchus between the enlarged right  main pulmonary artery and adjacent right lower lobe bronchus and right posterior pulmonary vein. No obstructing mass visualized.   Stable cylindrical bronchiectasis and calcified pleural and parenchymal scarring in the posterior left upper lobe/lingular region. Additional partially calcified linear scarring more superiorly in the left upper lobe is stable.   Mucous plugging in a small, medial left lower lobe branch bronchus is unchanged on image number 80/5. Two nearby adjacent 2 mm nodules in the left lower lobe on image number 70/5 are unchanged.   A 3 mm right upper lobe nodule on image number 22/5 is unchanged.   Peripheral posterior and medial, inferior right lower lobe cylindrical bronchiectasis is stable. Peripheral left lower lobe cylindrical bronchiectasis is stable. No pleural fluid.  Assessment and Plan:   1. COPD with asthma (HCC)   2. Perennial allergic rhinitis   3. LPRD (laryngopharyngeal reflux disease)   4. Rheumatoid arthritis involving multiple sites, unspecified whether rheumatoid factor present (HCC)      1.  Continue to treat inflammation of airway:  A. Flonase -1-2 sprays each nostril 1 time per day B. Trelegy 100 - 1 inhalation 1 time per day.    3.  If needed:   A. Albuterol - 2 inhalations every 4-6 hours.  4. Concerning rheumatoid arthritis:   A.  Discuss with Dr. Dierdre Forth about using anti-CD20 immunosuppression  B.  Try meloxicam 7.5 mg - 1 tablet 1-2 times a day   5.  Return to clinic in 6 months or earlier if problem   Taylor Berry appears to be doing okay on her current therapy which includes anti-inflammatory medicines for her upper and lower airway as noted above.  She is having significant problems  with her rheumatoid arthritis and I gave her some meloxicam to try to see if this helps and I have asked her to discuss with Dr. Dierdre Forth about possibly pursuing a different therapy other than anti-TNF monoclonals to help her with her rheumatoid arthritis.  I will see her back in this clinic in 6 months or earlier if there is a problem.   Taylor Schimke, MD Allergy / Immunology [facility]

## 2022-12-16 NOTE — Patient Instructions (Addendum)
  1.  Continue to treat inflammation of airway:  A. Flonase -1-2 sprays each nostril 1 time per day B. Trelegy 100 - 1 inhalation 1 time per day.    3.  If needed:   A. Albuterol - 2 inhalations every 4-6 hours.  4. Concerning rheumatoid arthritis:   A.  Discuss with Dr. Dierdre Forth about using anti-CD20 immunosuppression  B.  Try meloxicam 7.5 mg - 1 tablet 1-2 times a day   5.  Return to clinic in 6 months or earlier if problem

## 2022-12-16 NOTE — Telephone Encounter (Signed)
NFN 

## 2022-12-17 ENCOUNTER — Encounter: Payer: Self-pay | Admitting: Allergy and Immunology

## 2022-12-22 ENCOUNTER — Other Ambulatory Visit (HOSPITAL_COMMUNITY): Payer: Self-pay

## 2023-01-12 ENCOUNTER — Ambulatory Visit: Payer: PPO | Admitting: Podiatry

## 2023-01-13 ENCOUNTER — Telehealth: Payer: Self-pay | Admitting: Podiatry

## 2023-01-13 NOTE — Telephone Encounter (Signed)
 Per voicemail from 12/6 at 648pm please cxl appt for tomorrow 1/7 as she cannot make it in due to the weather.

## 2023-01-15 ENCOUNTER — Telehealth (INDEPENDENT_AMBULATORY_CARE_PROVIDER_SITE_OTHER): Payer: Self-pay | Admitting: Otolaryngology

## 2023-01-15 NOTE — Telephone Encounter (Signed)
 Reminder Call:  Date: 01/16/2023 Status: Sch  Time: 10:20 AM [address] [place], [place] [address] Confirmed

## 2023-01-16 ENCOUNTER — Encounter (INDEPENDENT_AMBULATORY_CARE_PROVIDER_SITE_OTHER): Payer: Self-pay

## 2023-01-16 ENCOUNTER — Ambulatory Visit (INDEPENDENT_AMBULATORY_CARE_PROVIDER_SITE_OTHER): Payer: PPO

## 2023-01-16 VITALS — BP 126/84 | HR 65 | Ht 62.0 in | Wt 155.0 lb

## 2023-01-16 DIAGNOSIS — H7201 Central perforation of tympanic membrane, right ear: Secondary | ICD-10-CM

## 2023-01-16 DIAGNOSIS — R0981 Nasal congestion: Secondary | ICD-10-CM

## 2023-01-16 DIAGNOSIS — H6981 Other specified disorders of Eustachian tube, right ear: Secondary | ICD-10-CM | POA: Diagnosis not present

## 2023-01-16 DIAGNOSIS — J310 Chronic rhinitis: Secondary | ICD-10-CM

## 2023-01-16 DIAGNOSIS — R42 Dizziness and giddiness: Secondary | ICD-10-CM

## 2023-01-16 DIAGNOSIS — H6121 Impacted cerumen, right ear: Secondary | ICD-10-CM

## 2023-01-16 NOTE — Progress Notes (Deleted)
 Marland Kitchen

## 2023-01-16 NOTE — Progress Notes (Signed)
 Patient ID: KENNETH LAX, female   DOB: April 29, 1940, 83 y.o.   MRN: [id number]  Follow-up: Recurrent dizziness, right middle ear effusion  HPI: The patient is an 83 year old female who returns today for her follow-up evaluation.  The patient was previously seen for recurrent dizziness, chronic nasal congestion, right middle ear effusion, and hearing loss.  The patient was treated with Flonase  nasal spray.  She also underwent right myringotomy and tube placement to treat her middle ear effusion and conductive hearing loss. Her dizziness was noted to be a result of left BPPV.  She was treated with left Epley maneuver. The patient returns today reporting no more dizziness.  Currently she denies any otalgia, otorrhea, or significant change in her hearing.  Exam: General: Communicates without difficulty, well nourished, no acute distress. Head: Normocephalic, no evidence injury, no tenderness, facial buttresses intact without stepoff. Face/sinus: No tenderness to palpation and percussion. Facial movement is normal and symmetric. Eyes: PERRL, EOMI. No scleral icterus, conjunctivae clear. Neuro: CN II exam reveals vision grossly intact.  No nystagmus at any point of gaze. Ears: Auricles well formed without lesions.  Right ear cerumen impaction.  The left ear canal and tympanic membrane are normal.  Nose: External evaluation reveals normal support and skin without lesions.  Dorsum is intact.  Anterior rhinoscopy reveals congested mucosa over anterior aspect of inferior turbinates and intact septum.  No purulence noted. Oral:  Oral cavity and oropharynx are intact, symmetric, without erythema or edema.  Mucosa is moist without lesions. Neck: Full range of motion without pain.  There is no significant lymphadenopathy.  No masses palpable.  Thyroid  bed within normal limits to palpation.  Parotid glands and submandibular glands equal bilaterally without mass.  Trachea is midline. Neuro:  CN 2-12 grossly intact.    Procedure: Right ear cerumen disimpaction Anesthesia: None Description: Under the operating microscope, the cerumen is carefully removed with a combination of cerumen currette, alligator forceps, and suction catheters.  After the cerumen is removed, the right ventilating tube is in place and patent.  No mass, erythema, or lesions. The patient tolerated the procedure well.    Assessment: 1.  The patient's left benign paroxysmal positional vertigo has resolved.  She is currently asymptomatic. 2.  Right ear cerumen impaction.  After the cerumen removal procedure, the right ventilating tube is in place and patent. 3.  Right ear eustachian tube dysfunction. 4.  Chronic rhinitis with nasal mucosal congestion.  Plan: 1.  Otomicroscopy with right ear cerumen disimpaction. 2.  The physical exam findings are reviewed with the patient. 3.  Continue dry ear precautions on the right side. 4.  The patient will return for reevaluation in 1 year.

## 2023-01-17 DIAGNOSIS — H7201 Central perforation of tympanic membrane, right ear: Secondary | ICD-10-CM | POA: Insufficient documentation

## 2023-01-17 DIAGNOSIS — R42 Dizziness and giddiness: Secondary | ICD-10-CM | POA: Insufficient documentation

## 2023-01-17 DIAGNOSIS — H6121 Impacted cerumen, right ear: Secondary | ICD-10-CM | POA: Insufficient documentation

## 2023-01-17 DIAGNOSIS — H6981 Other specified disorders of Eustachian tube, right ear: Secondary | ICD-10-CM | POA: Insufficient documentation

## 2023-01-26 ENCOUNTER — Ambulatory Visit (INDEPENDENT_AMBULATORY_CARE_PROVIDER_SITE_OTHER): Payer: PPO | Admitting: Podiatry

## 2023-01-26 DIAGNOSIS — Z91199 Patient's noncompliance with other medical treatment and regimen due to unspecified reason: Secondary | ICD-10-CM

## 2023-01-26 NOTE — Progress Notes (Signed)
No show

## 2023-01-30 ENCOUNTER — Other Ambulatory Visit (HOSPITAL_COMMUNITY): Payer: Self-pay

## 2023-02-13 DIAGNOSIS — M0589 Other rheumatoid arthritis with rheumatoid factor of multiple sites: Secondary | ICD-10-CM | POA: Diagnosis not present

## 2023-02-20 ENCOUNTER — Other Ambulatory Visit (HOSPITAL_COMMUNITY): Payer: Self-pay

## 2023-02-20 ENCOUNTER — Other Ambulatory Visit: Payer: Self-pay

## 2023-02-20 MED ORDER — MECLIZINE HCL 25 MG PO TABS
25.0000 mg | ORAL_TABLET | Freq: Three times a day (TID) | ORAL | 0 refills | Status: DC | PRN
  Filled 2023-02-20 (×2): qty 20, 7d supply, fill #0

## 2023-03-04 ENCOUNTER — Ambulatory Visit: Payer: PPO | Admitting: Podiatry

## 2023-03-12 ENCOUNTER — Other Ambulatory Visit: Payer: Self-pay

## 2023-03-12 ENCOUNTER — Other Ambulatory Visit (HOSPITAL_COMMUNITY): Payer: Self-pay

## 2023-03-12 ENCOUNTER — Other Ambulatory Visit: Payer: Self-pay | Admitting: Cardiology

## 2023-03-12 DIAGNOSIS — I10 Essential (primary) hypertension: Secondary | ICD-10-CM

## 2023-03-12 DIAGNOSIS — I428 Other cardiomyopathies: Secondary | ICD-10-CM

## 2023-03-12 MED ORDER — LEFLUNOMIDE 20 MG PO TABS
20.0000 mg | ORAL_TABLET | Freq: Every day | ORAL | 1 refills | Status: DC
  Filled 2023-03-12: qty 90, 90d supply, fill #0
  Filled 2023-06-12: qty 90, 90d supply, fill #1

## 2023-03-12 MED ORDER — CANDESARTAN CILEXETIL 8 MG PO TABS
8.0000 mg | ORAL_TABLET | Freq: Every day | ORAL | 2 refills | Status: DC
  Filled 2023-03-12: qty 90, 90d supply, fill #0
  Filled 2023-06-12: qty 90, 90d supply, fill #1
  Filled 2023-09-20: qty 90, 90d supply, fill #2

## 2023-03-13 ENCOUNTER — Other Ambulatory Visit: Payer: Self-pay

## 2023-03-13 ENCOUNTER — Other Ambulatory Visit (HOSPITAL_COMMUNITY): Payer: Self-pay

## 2023-03-16 ENCOUNTER — Other Ambulatory Visit (HOSPITAL_COMMUNITY): Payer: Self-pay

## 2023-03-16 MED ORDER — TRAMADOL HCL 50 MG PO TABS
50.0000 mg | ORAL_TABLET | ORAL | 0 refills | Status: DC | PRN
  Filled 2023-03-16 (×3): qty 6, 1d supply, fill #0

## 2023-03-16 MED ORDER — CHLORHEXIDINE GLUCONATE 0.12 % MT SOLN
15.0000 mL | Freq: Two times a day (BID) | OROMUCOSAL | 0 refills | Status: DC
  Filled 2023-03-16 (×2): qty 473, 16d supply, fill #0

## 2023-03-17 ENCOUNTER — Telehealth: Payer: Self-pay | Admitting: Allergy and Immunology

## 2023-03-17 ENCOUNTER — Telehealth: Payer: Self-pay | Admitting: Emergency Medicine

## 2023-03-17 NOTE — Telephone Encounter (Signed)
 Pt request samples of trelogy

## 2023-03-17 NOTE — Telephone Encounter (Signed)
 PT would like Trelegy Samples left up front for her. Please call to advise her when ready. [phone]

## 2023-03-18 MED ORDER — TRELEGY ELLIPTA 100-62.5-25 MCG/ACT IN AEPB: 1.0000 | INHALATION_SPRAY | Freq: Every day | RESPIRATORY_TRACT | Status: DC

## 2023-03-18 MED ORDER — TRELEGY ELLIPTA 100-62.5-25 MCG/ACT IN AEPB: 200.0000 ug | INHALATION_SPRAY | Freq: Every day | RESPIRATORY_TRACT | Status: DC

## 2023-03-18 NOTE — Telephone Encounter (Signed)
 Medication Samples have been provided to the patient.  Drug name: Trelegy       Strength: 100        Qty: 2  LOT: [id number]  Exp.Date: 07/05/2024  Dosing instructions: one puff once daily  The patient has been instructed regarding the correct time, dose, and frequency of taking this medication, including desired effects and most common side effects.   Dub Mikes 1:42 PM 03/18/2023

## 2023-03-18 NOTE — Addendum Note (Signed)
 Addended byFloydene Flock, Emeka Lindner A on: 03/18/2023 08:57 AM   Modules accepted: Orders

## 2023-03-18 NOTE — Telephone Encounter (Signed)
 ATC pt x2, left detailed vm regarding trelegy samples. Pt is unable to afford trelegy. 2 samples up front for pick up. NFN.

## 2023-03-18 NOTE — Telephone Encounter (Signed)
 Spoke with patient, informed her that Trelegy samples have been placed up front for pick up. Patient verbalized understanding.

## 2023-03-18 NOTE — Telephone Encounter (Signed)
 Sascha is requesting samples of Trelegy 100

## 2023-03-18 NOTE — Telephone Encounter (Signed)
 Patient came in to [address] to pick up Trelegy samples. I verify name and DOB with patient and handed it to her.

## 2023-03-31 DIAGNOSIS — M79671 Pain in right foot: Secondary | ICD-10-CM | POA: Diagnosis not present

## 2023-04-01 ENCOUNTER — Telehealth: Payer: Self-pay | Admitting: *Deleted

## 2023-04-01 NOTE — Telephone Encounter (Signed)
-----   Message from [employer] sent at 03/31/2023  1:18 PM EDT ----- Regarding: Patient Needs Appointment ASAP Hi, Patient needs to be soon ASAP per [facility]. 03/31/2023 AP

## 2023-04-01 NOTE — Telephone Encounter (Signed)
 Spoke with Taylor Berry, she was recently seen by [facility] because of a bunyan on her foot. For the last couple weeks, she has noticed that her toes will turn black and they are cold. The folks at [facility] told her to call us to be seen. She does not have any sores or ulcers on her feet currently. Follow up scheduled

## 2023-04-06 ENCOUNTER — Ambulatory Visit: Attending: Physician Assistant | Admitting: Physician Assistant

## 2023-04-06 ENCOUNTER — Encounter: Payer: Self-pay | Admitting: Physician Assistant

## 2023-04-06 VITALS — BP 126/84 | HR 73 | Ht 62.0 in | Wt 152.8 lb

## 2023-04-06 DIAGNOSIS — L819 Disorder of pigmentation, unspecified: Secondary | ICD-10-CM | POA: Diagnosis not present

## 2023-04-06 DIAGNOSIS — I428 Other cardiomyopathies: Secondary | ICD-10-CM | POA: Diagnosis not present

## 2023-04-06 DIAGNOSIS — I10 Essential (primary) hypertension: Secondary | ICD-10-CM

## 2023-04-06 NOTE — Patient Instructions (Signed)
 Medication Instructions:  NO CHANGES *If you need a refill on your cardiac medications before your next appointment, please call your pharmacy*  Lab Work: NO LABS If you have labs (blood work) drawn today and your tests are completely normal, you will receive your results only by: MyChart Message (if you have MyChart) OR A paper copy in the mail If you have any lab test that is abnormal or we need to change your treatment, we will call you to review the results.  Testing/Procedures:[address] Your physician has requested that you have an ankle brachial index (ABI). During this test an ultrasound and blood pressure cuff are used to evaluate the arteries that supply the arms and legs with blood. Allow thirty minutes for this exam. There are no restrictions or special instructions.  Please note: We ask at that you not bring children with you during ultrasound (echo/ vascular) testing. Due to room size and safety concerns, children are not allowed in the ultrasound rooms during exams. Our front office staff cannot provide observation of children in our lobby area while testing is being conducted. An adult accompanying a patient to their appointment will only be allowed in the ultrasound room at the discretion of the ultrasound technician under special circumstances. We apologize for any inconvenience.   Follow-Up: At [facility], you and your health needs are our priority.  As part of our continuing mission to provide you with exceptional heart care, our providers are all part of one team.  This team includes your primary Cardiologist (physician) and Advanced Practice Providers or APPs (Physician Assistants and Nurse Practitioners) who all work together to provide you with the care you need, when you need it.  Your next appointment:   6 month(s)  Provider:   Olga Millers, MD    Other Instructions   1st Floor: - Lobby - Registration  - Pharmacy  - Lab -  Cafe  2nd Floor: - PV Lab - Diagnostic Testing (echo, CT, nuclear med)  3rd Floor: - Vacant  4th Floor: - TCTS (cardiothoracic surgery) - AFib Clinic - Structural Heart Clinic - Vascular Surgery  - Vascular Ultrasound  5th Floor: - HeartCare Cardiology (general and EP) - Clinical Pharmacy for coumadin, hypertension, lipid, weight-loss medications, and med management appointments    Valet parking services will be available as well.

## 2023-04-06 NOTE — Progress Notes (Unsigned)
  Cardiology Office Note:  .   Date:  04/06/2023  ID:  Taylor Berry, DOB 1940-06-19, MRN [id number] PCP: Irven Coe, MD  [facility] Providers Cardiologist:  Olga Millers, MD { Click to update primary MD,subspecialty MD or APP then REFRESH:1}   History of Present Illness: .   Taylor Berry is a 83 y.o. female with past medical history of lung cancer, COPD and OSA.  Cardiac catheterization October 2018 showed mild nonobstructive CAD with normal filling pressures.  Echocardiogram in March 2019 showed EF of 30 to 35%, mild LAE.  She declined ICD previously.  Heart monitor in September 2021 showed sinus bradycardia, normal sinus rhythm, occasional PAC and a 46 beat run of PAT and occasional PVCs.  Metoprolol succinate was decreased due to bradycardia.  Patient had a CVA in December 2022 likely secondary to small vessel disease.  Heart monitor in June 2023 showed sinus rhythm with occasional PACs, brief run of PAT, occasional PVCs and rare couplet.  Patient was seen in May 2023 with dizziness and the beta-blocker was discontinued.  Most recent echocardiogram in June 2024 showed normal EF, grade 1 DD, mild LAE.  Patient was last seen by Dr. Jens Som in October 2024 at which time she was doing well.  She is on Plavix due to history of CVA.  Patient presents today for evaluation of fluid health.  This has been going on for several months.  On physical exam, right lower extremity pulses weak, left lower extremity dorsalis pedis pulse is strong.  However she has bluish discoloration in her toes bilaterally.  I will order ABI/TBI to look for vascular disease.  She denies any claudication symptom.  She has no chest pain or shortness of breath.  On physical exam, she does have prominent crackles in the left lower lobe.  On the previous CT image from last year, she has emphysema, bronchiectasis, scarring and atelectasis.  She is aware to contact her pulmonologist if her breathing worsens.  If ABI is  normal, she can follow-up with Dr. Jens Som in 6 months.  Note to self, contact the patient myself in regard to the ABI result.    ROS: ***  Studies Reviewed: .        *** Risk Assessment/Calculations:   {Does this patient have ATRIAL FIBRILLATION?[phone]}         Physical Exam:   VS:  BP 126/84 (BP Location: Left Arm, Patient Position: Sitting)   Pulse 73   Ht 5\' 2"  (1.575 m)   Wt 152 lb 12.5 oz (69.3 kg)   SpO2 95%   BMI 27.94 kg/m    Wt Readings from Last 3 Encounters:  04/06/23 152 lb 12.5 oz (69.3 kg)  01/16/23 155 lb (70.3 kg)  12/16/22 153 lb 6.4 oz (69.6 kg)    GEN: Well nourished, well developed in no acute distress NECK: No JVD; No carotid bruits CARDIAC: ***RRR, no murmurs, rubs, gallops RESPIRATORY:  Clear to auscultation without rales, wheezing or rhonchi  ABDOMEN: Soft, non-tender, non-distended EXTREMITIES:  No edema; No deformity   ASSESSMENT AND PLAN: .   ***    {Are you ordering a CV Procedure (e.g. stress test, cath, DCCV, TEE, etc)?   Press F2        :[id number]}  Dispo: ***  Signed, Azalee Course, PA

## 2023-04-13 DIAGNOSIS — M79671 Pain in right foot: Secondary | ICD-10-CM | POA: Diagnosis not present

## 2023-04-13 DIAGNOSIS — L84 Corns and callosities: Secondary | ICD-10-CM | POA: Diagnosis not present

## 2023-04-13 DIAGNOSIS — M069 Rheumatoid arthritis, unspecified: Secondary | ICD-10-CM | POA: Diagnosis not present

## 2023-04-15 ENCOUNTER — Ambulatory Visit (INDEPENDENT_AMBULATORY_CARE_PROVIDER_SITE_OTHER): Admitting: Podiatry

## 2023-04-15 ENCOUNTER — Encounter: Payer: Self-pay | Admitting: Podiatry

## 2023-04-15 DIAGNOSIS — B351 Tinea unguium: Secondary | ICD-10-CM

## 2023-04-15 DIAGNOSIS — M79609 Pain in unspecified limb: Secondary | ICD-10-CM

## 2023-04-15 DIAGNOSIS — L97511 Non-pressure chronic ulcer of other part of right foot limited to breakdown of skin: Secondary | ICD-10-CM | POA: Diagnosis not present

## 2023-04-15 NOTE — Addendum Note (Signed)
 Addended by: Louann Sjogren R on: 04/15/2023 10:37 AM   Modules accepted: Level of Service

## 2023-04-15 NOTE — Progress Notes (Signed)
 Subjective: Taylor Berry is a 83 y.o. female patient who presents to office today with concern of long,mildly painful nails  while ambulating in shoes; unable to trim.  She has rheumatoid arthritis and this makes it difficult for her to trim her nails.  Patient denies any new cramping, numbness, burning or tingling in the legs or feet. She has ABIs scheduled soon.  Patient Active Problem List   Diagnosis Date Noted   Impacted cerumen of right ear 01/17/2023   Dizziness 01/17/2023   Central perforation of tympanic membrane of right ear 01/17/2023   Other specified disorders of eustachian tube, right ear 01/17/2023   Hepatic lesion 12/20/2021   Chronic systolic heart failure (HCC)    Bradycardia 05/23/2021   Postural dizziness with presyncope 05/23/2021   Acute prerenal azotemia 05/23/2021   Cerebral thrombosis with cerebral infarction 01/02/2021   Encephalopathy acute/expressive aphasia  12/30/2020   Hyponatremia 12/30/2020   GERD (gastroesophageal reflux disease) 12/30/2020   Hypocalcemia 12/30/2020   Acute on chronic combined systolic and diastolic CHF (congestive heart failure) (HCC) 04/28/2019   non obstructive CAD 04/28/2019   Edema 04/14/2019   Abnormal CT of the chest 03/22/2018   Acid reflux 07/13/2017   Affective psychosis (HCC) 07/13/2017   Alcohol dependence in remission (HCC) 07/13/2017   BD (Bowen's disease) 07/13/2017   History of colon polyps 07/13/2017   Rheumatoid arthritis (HCC) 07/13/2017   Chronic bronchitis (HCC) 07/13/2017   Secondary pulmonary hypertension 07/13/2017   Obstructive apnea 07/13/2017   Age related osteoporosis 07/13/2017   Non-ischemic cardiomyopathy/diastolic and systolic CHF    Hyperhidrosis 40/98/1191   Anemia in neoplastic disease 05/02/2013   Allergic rhinitis 04/29/2013   Depression 04/29/2013   Lung cancer, left upper lobe 02/03/2013   Pulmonary infiltrates 01/05/2013   Mild pulmonary hypertension (HCC) 02/17/2011   OSA  (obstructive sleep apnea) 02/17/2011   Dyspnea on exertion 11/22/2010   COPD (chronic obstructive pulmonary disease) (HCC) 07/20/2009   Carcinoma in situ of cervix uteri 06/26/2009   Pure hypercholesterolemia 06/26/2009   OBESITY 06/26/2009   Chronic disease anemia 06/26/2009   Essential (primary) hypertension 06/26/2009   ARTHRITIS, RHEUMATOID 06/26/2009   OSTEOPOROSIS 06/26/2009   Cough 06/26/2009   Current Outpatient Medications on File Prior to Visit  Medication Sig Dispense Refill   albuterol (VENTOLIN HFA) 108 (90 Base) MCG/ACT inhaler Inhale 2 puffs into the lungs every 4 (four) hours as needed for wheezing or shortness of breath. (Patient not taking: Reported on 04/06/2023) 6.7 g 1   atorvastatin (LIPITOR) 40 MG tablet Take 1 tablet (40 mg total) by mouth daily. 90 tablet 3   Calcium Carb-Cholecalciferol (CALCIUM + D3 PO) Take 1 tablet by mouth daily.     CALCIUM-VITAMIN D PO Take 1 tablet by mouth daily.     candesartan (ATACAND) 8 MG tablet Take 1 tablet (8 mg total) by mouth daily. 90 tablet 2   chlorhexidine (PERIOGARD) 0.12 % solution Gently rinse mouth with 15 mLs , Rinse 2 (two) times daily. Spit after use. 473 mL 0   cholestyramine (QUESTRAN) 4 GM/DOSE powder Take 4 g by mouth daily.     clopidogrel (PLAVIX) 75 MG tablet Take 1 tablet (75 mg total) by mouth daily. 90 tablet 3   colestipol (COLESTID) 1 g tablet Take 2 g by mouth daily.     Cyanocobalamin (VITAMIN B12) 1000 MCG TBCR Take 1 tablet by mouth daily.     denosumab (PROLIA) 60 MG/ML SOSY injection Inject 60 mg into the skin  every 6 (six) months.     fluticasone (FLONASE) 50 MCG/ACT nasal spray Place 1-2 sprays into both nostrils daily. 16 g 5   fluticasone (FLONASE) 50 MCG/ACT nasal spray Place 1-2 sprays into both nostrils daily. 16 g 5   Fluticasone-Umeclidin-Vilant (TRELEGY ELLIPTA) 100-62.5-25 MCG/ACT AEPB Inhale 1 puff into the lungs daily. 28 each 5   Fluticasone-Umeclidin-Vilant (TRELEGY ELLIPTA) 100-62.5-25  MCG/ACT AEPB Inhale 200 mcg into the lungs daily. 2 each    Fluticasone-Umeclidin-Vilant (TRELEGY ELLIPTA) 100-62.5-25 MCG/ACT AEPB Inhale 1 puff into the lungs daily.     furosemide (LASIX) 20 MG tablet Take 1 tablet (20 mg total) by mouth daily. 90 tablet 3   leflunomide (ARAVA) 20 MG tablet Take 1 tablet (20 mg total) by mouth daily. 90 tablet 1   meclizine (ANTIVERT) 25 MG tablet Take 1 tablet (25 mg total) by mouth up to 3 (three) times daily as needed for vertigo. (Patient not taking: Reported on 04/06/2023) 20 tablet 0   meclizine (ANTIVERT) 25 MG tablet Take 1 tablet (25 mg total) by mouth up to 3 (three) times daily as needed for vertigo. 20 tablet 0   meloxicam (MOBIC) 7.5 MG tablet Take 1 tablet (7.5 mg total) by mouth daily. 60 tablet 3   METAMUCIL FIBER PO Take by mouth See admin instructions. Mix 1 tablespoonful of powder into 4-8 ounces of desired beverage and drink once a day     predniSONE (DELTASONE) 20 MG tablet Take 40 mg by mouth daily. (Patient not taking: Reported on 04/06/2023)     predniSONE (DELTASONE) 5 MG tablet Take 3 tablets by mouth once a day for 7 days, then take 2 tablets once a day for 7 days, then take 1 tablet once a day for 14 days (Patient not taking: Reported on 04/06/2023) 49 tablet 3   traMADol (ULTRAM) 50 MG tablet Take 1 tablet (50 mg total) by mouth every 4-6 (four to six) hours as needed for pain. (Patient not taking: Reported on 04/06/2023) 6 tablet 0   No current facility-administered medications on file prior to visit.   Allergies  Allergen Reactions   Latex Itching and Rash   Metoprolol Tartrate Other (See Comments) and Shortness Of Breath   Ace Inhibitors Cough   Angiotensin Receptor Blockers Swelling and Other (See Comments)    Tongue swelling   Fentanyl Other (See Comments)    Hallucinations "I go crazy"      Guaifenesin Er Diarrhea    Other Reaction(s): Not available   Hydrochlorothiazide Other (See Comments)    Decreased Sodium    Hydroxychloroquine Other (See Comments)    "Decreased sodium"   Penicillamine Nausea And Vomiting   Penicillins Other (See Comments)    Yeast infections Has patient had a PCN reaction causing immediate rash, facial/tongue/throat swelling, SOB or lightheadedness with hypotension:No Has patient had a PCN reaction causing severe rash involving mucus membranes or skin necrosis: No Has patient had a PCN reaction that required hospitalization No Has patient had a PCN reaction occurring within the last 10 years: Yes If all of the above answers are "NO", then may proceed with Cephalosporin use.  Other reaction(s): yeast infection     Objective: General: Patient is awake, alert, and oriented x 3 and in no acute distress.  Integument: Skin is warm, dry and supple bilateral. Nails are tender, long, thickened and  dystrophic with subungual debris, consistent with onychomycosis, 1-5 bilateral. No signs of infection. Lateral fifth metatarsal base noted ulceration underlying hyperkeratosis. Fibrinous base  superfiical. Measurement below. No erythema edema or purulence noted. Remaining integument unremarkable.  Vasculature:  Dorsalis Pedis pulse 1/4 bilateral. Posterior Tibial pulse  1/4 bilateral.  Capillary fill time <3 sec 1-5 bilateral. Positive hair growth to the level of the digits. Temperature gradient within normal limits. No varicosities present bilateral. No edema present bilateral.   Neurology: The patient has intact sensation measured with a 5.07/10g Semmes Weinstein Monofilament at all pedal sites bilateral . Vibratory sensation diminished bilateral with tuning fork. No Babinski sign present bilateral.   Musculoskeletal: No symptomatic pedal deformities noted bilateral. Muscular strength 5/5 in all lower extremity muscular groups bilateral without pain on range of motion . No tenderness with calf compression bilateral.  Assessment and Plan:   ICD-10-CM   1. Pain due to onychomycosis of  nail  B35.1    M79.609     2. Skin ulcer of plantar aspect of right foot, limited to breakdown of skin (HCC)  L97.511           -Examined patient. -Mechanically debrided all nails 1-5 bilateral using sterile nail nipper and filed with sterile dremel without incident  Ulcer lateral plantar fifth metatarsal base right foot limited to breakdown of skin  -Debridement as below. -Dressed with neosporin, DSD. -Off-loading with donut pads  -No abx indicated.  -Pending ABIs -Discussed glucose control and proper protein-rich diet.  -Discussed if any worsening redness, pain, fever or chills to call or may need to report to the emergency room. Patient expressed understanding.   Procedure: Excisional Debridement of Wound Rationale: Removal of non-viable soft tissue from the wound to promote healing.  Anesthesia: none Pre-Debridement Wound Measurements: Overlying callus  Post-Debridement Wound Measurements: 0.1 cm x 0.2 cm x 0.1 cm  Type of Debridement: Sharp Excisional Tissue Removed: Non-viable soft tissue Depth of Debridement: subcutaneous tissue. Technique: Sharp excisional debridement to bleeding, viable wound base.  Dressing: Dry, sterile, compression dressing. Disposition: Patient tolerated procedure well. Patient to return in 2 week for follow-up.  Return in about 2 weeks (around 04/29/2023) for wound check.    Louann Sjogren, DPM

## 2023-04-24 ENCOUNTER — Ambulatory Visit (HOSPITAL_COMMUNITY)
Admission: RE | Admit: 2023-04-24 | Discharge: 2023-04-24 | Disposition: A | Discharge status: Home / Self Care | Source: Ambulatory Visit | Attending: Cardiovascular Disease | Admitting: Cardiovascular Disease

## 2023-04-24 DIAGNOSIS — L819 Disorder of pigmentation, unspecified: Secondary | ICD-10-CM | POA: Diagnosis not present

## 2023-04-24 LAB — VAS US ABI WITH/WO TBI
Left ABI: 1.12
Right ABI: 1.11

## 2023-04-29 ENCOUNTER — Ambulatory Visit (INDEPENDENT_AMBULATORY_CARE_PROVIDER_SITE_OTHER): Admitting: Podiatry

## 2023-04-29 ENCOUNTER — Encounter: Payer: Self-pay | Admitting: Podiatry

## 2023-04-29 DIAGNOSIS — L97511 Non-pressure chronic ulcer of other part of right foot limited to breakdown of skin: Secondary | ICD-10-CM | POA: Diagnosis not present

## 2023-04-29 NOTE — Progress Notes (Signed)
 Subjective: Taylor Berry is a 83 y.o. female patient who presents for follow-up of wound.   She has rheumatoid arthritis Patient denies any new cramping, numbness, burning or tingling in the legs or feet. She has ABIs scheduled soon.  Patient Active Problem List   Diagnosis Date Noted   Impacted cerumen of right ear 01/17/2023   Dizziness 01/17/2023   Central perforation of tympanic membrane of right ear 01/17/2023   Other specified disorders of eustachian tube, right ear 01/17/2023   Hepatic lesion 12/20/2021   Chronic systolic heart failure (HCC)    Bradycardia 05/23/2021   Postural dizziness with presyncope 05/23/2021   Acute prerenal azotemia 05/23/2021   Cerebral thrombosis with cerebral infarction 01/02/2021   Encephalopathy acute/expressive aphasia  12/30/2020   Hyponatremia 12/30/2020   GERD (gastroesophageal reflux disease) 12/30/2020   Hypocalcemia 12/30/2020   Acute on chronic combined systolic and diastolic CHF (congestive heart failure) (HCC) 04/28/2019   non obstructive CAD 04/28/2019   Edema 04/14/2019   Abnormal CT of the chest 03/22/2018   Acid reflux 07/13/2017   Affective psychosis (HCC) 07/13/2017   Alcohol dependence in remission (HCC) 07/13/2017   BD (Bowen's disease) 07/13/2017   History of colon polyps 07/13/2017   Rheumatoid arthritis (HCC) 07/13/2017   Chronic bronchitis (HCC) 07/13/2017   Secondary pulmonary hypertension 07/13/2017   Obstructive apnea 07/13/2017   Age related osteoporosis 07/13/2017   Non-ischemic cardiomyopathy/diastolic and systolic CHF    Hyperhidrosis 95/63/8756   Anemia in neoplastic disease 05/02/2013   Allergic rhinitis 04/29/2013   Depression 04/29/2013   Lung cancer, left upper lobe 02/03/2013   Pulmonary infiltrates 01/05/2013   Mild pulmonary hypertension (HCC) 02/17/2011   OSA (obstructive sleep apnea) 02/17/2011   Dyspnea on exertion 11/22/2010   COPD (chronic obstructive pulmonary disease) (HCC) 07/20/2009    Carcinoma in situ of cervix uteri 06/26/2009   Pure hypercholesterolemia 06/26/2009   OBESITY 06/26/2009   Chronic disease anemia 06/26/2009   Essential (primary) hypertension 06/26/2009   ARTHRITIS, RHEUMATOID 06/26/2009   OSTEOPOROSIS 06/26/2009   Cough 06/26/2009   Current Outpatient Medications on File Prior to Visit  Medication Sig Dispense Refill   albuterol  (VENTOLIN  HFA) 108 (90 Base) MCG/ACT inhaler Inhale 2 puffs into the lungs every 4 (four) hours as needed for wheezing or shortness of breath. (Patient not taking: Reported on 04/06/2023) 6.7 g 1   atorvastatin  (LIPITOR) 40 MG tablet Take 1 tablet (40 mg total) by mouth daily. 90 tablet 3   Calcium  Carb-Cholecalciferol (CALCIUM  + D3 PO) Take 1 tablet by mouth daily.     CALCIUM -VITAMIN D  PO Take 1 tablet by mouth daily.     candesartan  (ATACAND ) 8 MG tablet Take 1 tablet (8 mg total) by mouth daily. 90 tablet 2   chlorhexidine  (PERIOGARD ) 0.12 % solution Gently rinse mouth with 15 mLs , Rinse 2 (two) times daily. Spit after use. 473 mL 0   cholestyramine  (QUESTRAN ) 4 GM/DOSE powder Take 4 g by mouth daily.     clopidogrel  (PLAVIX ) 75 MG tablet Take 1 tablet (75 mg total) by mouth daily. 90 tablet 3   colestipol  (COLESTID ) 1 g tablet Take 2 g by mouth daily.     Cyanocobalamin  (VITAMIN B12) 1000 MCG TBCR Take 1 tablet by mouth daily.     denosumab  (PROLIA ) 60 MG/ML SOSY injection Inject 60 mg into the skin every 6 (six) months.     fluticasone  (FLONASE ) 50 MCG/ACT nasal spray Place 1-2 sprays into both nostrils daily. 16 g 5  fluticasone  (FLONASE ) 50 MCG/ACT nasal spray Place 1-2 sprays into both nostrils daily. 16 g 5   Fluticasone -Umeclidin-Vilant (TRELEGY ELLIPTA ) 100-62.5-25 MCG/ACT AEPB Inhale 1 puff into the lungs daily. 28 each 5   Fluticasone -Umeclidin-Vilant (TRELEGY ELLIPTA ) 100-62.5-25 MCG/ACT AEPB Inhale 200 mcg into the lungs daily. 2 each    Fluticasone -Umeclidin-Vilant (TRELEGY ELLIPTA ) 100-62.5-25 MCG/ACT AEPB  Inhale 1 puff into the lungs daily.     furosemide  (LASIX ) 20 MG tablet Take 1 tablet (20 mg total) by mouth daily. 90 tablet 3   leflunomide  (ARAVA ) 20 MG tablet Take 1 tablet (20 mg total) by mouth daily. 90 tablet 1   meclizine  (ANTIVERT ) 25 MG tablet Take 1 tablet (25 mg total) by mouth up to 3 (three) times daily as needed for vertigo. (Patient not taking: Reported on 04/06/2023) 20 tablet 0   meclizine  (ANTIVERT ) 25 MG tablet Take 1 tablet (25 mg total) by mouth up to 3 (three) times daily as needed for vertigo. 20 tablet 0   meloxicam  (MOBIC ) 7.5 MG tablet Take 1 tablet (7.5 mg total) by mouth daily. 60 tablet 3   METAMUCIL FIBER PO Take by mouth See admin instructions. Mix 1 tablespoonful of powder into 4-8 ounces of desired beverage and drink once a day     predniSONE  (DELTASONE ) 20 MG tablet Take 40 mg by mouth daily. (Patient not taking: Reported on 04/06/2023)     predniSONE  (DELTASONE ) 5 MG tablet Take 3 tablets by mouth once a day for 7 days, then take 2 tablets once a day for 7 days, then take 1 tablet once a day for 14 days (Patient not taking: Reported on 04/06/2023) 49 tablet 3   traMADol  (ULTRAM ) 50 MG tablet Take 1 tablet (50 mg total) by mouth every 4-6 (four to six) hours as needed for pain. (Patient not taking: Reported on 04/06/2023) 6 tablet 0   No current facility-administered medications on file prior to visit.   Allergies  Allergen Reactions   Latex Itching and Rash   Metoprolol  Tartrate Other (See Comments) and Shortness Of Breath   Ace Inhibitors Cough   Angiotensin Receptor Blockers Swelling and Other (See Comments)    Tongue swelling   Fentanyl  Other (See Comments)    Hallucinations "I go crazy"      Guaifenesin  Er Diarrhea    Other Reaction(s): Not available   Hydrochlorothiazide Other (See Comments)    Decreased Sodium   Hydroxychloroquine Other (See Comments)    "Decreased sodium"   Penicillamine Nausea And Vomiting   Penicillins Other (See Comments)     Yeast infections Has patient had a PCN reaction causing immediate rash, facial/tongue/throat swelling, SOB or lightheadedness with hypotension:No Has patient had a PCN reaction causing severe rash involving mucus membranes or skin necrosis: No Has patient had a PCN reaction that required hospitalization No Has patient had a PCN reaction occurring within the last 10 years: Yes If all of the above answers are "NO", then may proceed with Cephalosporin use.  Other reaction(s): yeast infection     Objective: General: Patient is awake, alert, and oriented x 3 and in no acute distress.  Integument: Skin is warm, dry and supple bilateral. Nails are tender, long, thickened and  dystrophic with subungual debris, consistent with onychomycosis, 1-5 bilateral. No signs of infection. Lateral fifth metatarsal base noted ulceration underlying hyperkeratosis. Fibrinous base superfiical. Measurement below. No erythema edema or purulence noted. Remaining integument unremarkable.  Vasculature:  Dorsalis Pedis pulse 1/4 bilateral. Posterior Tibial pulse  1/4 bilateral.  Capillary fill time <3 sec 1-5 bilateral. Positive hair growth to the level of the digits. Temperature gradient within normal limits. No varicosities present bilateral. No edema present bilateral.   Neurology: The patient has intact sensation measured with a 5.07/10g Semmes Weinstein Monofilament at all pedal sites bilateral . Vibratory sensation diminished bilateral with tuning fork. No Babinski sign present bilateral.   Musculoskeletal: No symptomatic pedal deformities noted bilateral. Muscular strength 5/5 in all lower extremity muscular groups bilateral without pain on range of motion . No tenderness with calf compression bilateral.  Summary:  Right: Resting right ankle-brachial index is within normal range. The  right toe-brachial index is abnormal.   Left: Resting left ankle-brachial index is within normal range. The left  toe-brachial  index is abnormal.   Assessment and Plan:   ICD-10-CM   1. Skin ulcer of plantar aspect of right foot, limited to breakdown of skin (HCC)  L97.511            -Examined patient. -Mechanically debrided all nails 1-5 bilateral using sterile nail nipper and filed with sterile dremel without incident  Ulcer lateral plantar fifth metatarsal base right foot limited to breakdown of skin  -Debridement as below. -Dressed with neosporin, DSD. -Off-loading with donut pads  -No abx indicated.  -ABIs within normal range and TBIS abnormal. Discussed if we still can't get healed in c ouple weeks would recommend follow-up with vascular.  -Discussed glucose control and proper protein-rich diet.  -Discussed if any worsening redness, pain, fever or chills to call or may need to report to the emergency room. Patient expressed understanding.   Procedure: Excisional Debridement of Wound Rationale: Removal of non-viable soft tissue from the wound to promote healing.  Anesthesia: none Pre-Debridement Wound Measurements: Overlying callus  Post-Debridement Wound Measurements: 0.1 cm x 0.1 cm x 0.1 cm  Type of Debridement: Sharp Excisional Tissue Removed: Non-viable soft tissue Depth of Debridement: subcutaneous tissue. Technique: Sharp excisional debridement to bleeding, viable wound base.  Dressing: Dry, sterile, compression dressing. Disposition: Patient tolerated procedure well. Patient to return in 2 week for follow-up.  No follow-ups on file.    Jennefer Moats, DPM

## 2023-05-01 ENCOUNTER — Other Ambulatory Visit (HOSPITAL_COMMUNITY): Payer: Self-pay

## 2023-05-01 ENCOUNTER — Other Ambulatory Visit: Payer: Self-pay

## 2023-05-04 ENCOUNTER — Encounter: Payer: Self-pay | Admitting: Physical Therapy

## 2023-05-04 ENCOUNTER — Encounter: Payer: Self-pay | Admitting: Gastroenterology

## 2023-05-04 ENCOUNTER — Ambulatory Visit: Attending: Gastroenterology | Admitting: Physical Therapy

## 2023-05-04 ENCOUNTER — Ambulatory Visit (INDEPENDENT_AMBULATORY_CARE_PROVIDER_SITE_OTHER): Payer: PPO | Admitting: Gastroenterology

## 2023-05-04 ENCOUNTER — Other Ambulatory Visit: Payer: Self-pay

## 2023-05-04 VITALS — BP 122/74 | HR 62 | Ht 62.0 in | Wt 149.0 lb

## 2023-05-04 DIAGNOSIS — R197 Diarrhea, unspecified: Secondary | ICD-10-CM | POA: Diagnosis not present

## 2023-05-04 DIAGNOSIS — R932 Abnormal findings on diagnostic imaging of liver and biliary tract: Secondary | ICD-10-CM

## 2023-05-04 DIAGNOSIS — R278 Other lack of coordination: Secondary | ICD-10-CM | POA: Insufficient documentation

## 2023-05-04 DIAGNOSIS — R159 Full incontinence of feces: Secondary | ICD-10-CM | POA: Diagnosis not present

## 2023-05-04 DIAGNOSIS — M6281 Muscle weakness (generalized): Secondary | ICD-10-CM | POA: Insufficient documentation

## 2023-05-04 DIAGNOSIS — R2689 Other abnormalities of gait and mobility: Secondary | ICD-10-CM | POA: Diagnosis not present

## 2023-05-04 DIAGNOSIS — K529 Noninfective gastroenteritis and colitis, unspecified: Secondary | ICD-10-CM

## 2023-05-04 NOTE — Therapy (Signed)
 OUTPATIENT PHYSICAL THERAPY FEMALE PELVIC EVALUATION   Patient Name: Taylor Berry MRN: [id number] DOB:[birth year], 83 y.o., female Today's Date: 05/04/2023  END OF SESSION:  PT End of Session - 05/04/23 1743     Visit Number 1    Authorization Type [employer]    Authorization Time Period 07/26/2013    PT Start Time 1453    PT Stop Time 1535    PT Time Calculation (min) 42 min    Activity Tolerance Patient tolerated treatment well    Behavior During Therapy WFL for tasks assessed/performed             Past Medical History:  Diagnosis Date   Abnormal CT scan    Allergic rhinitis    Anemia 2015   after lung surgery   Angio-edema    Complication of anesthesia    Fentanyl  allergy, "claustrophobia"   COPD (chronic obstructive pulmonary disease) (HCC)    Depression    Diaphoresis 07/30/2015   Excessive sweating with little exertion   Difficult intravenous access    GERD (gastroesophageal reflux disease)    In the past   Hyperlipidemia    Hypertension    Left bundle branch block    Lung cancer (HCC) 2015   left upper lobe wedge removed-no further tx.   Neuropathy    Tingling in arms, fingers, and feet (Since 06/15/16)   Obesity    OSA (obstructive sleep apnea)    denies.   Osteoarthritis    ra also   Osteoporosis    Recurrent upper respiratory infection (URI)    Rheumatoid arthritis(714.0)    Dr. Ebbie Goldmann   SOB (shortness of breath) on exertion    Uterine cancer (HCC) 1971   tx surgical   Past Surgical History:  Procedure Laterality Date   ABDOMINAL HYSTERECTOMY     in situ carcinoma partial   CARDIAC CATHETERIZATION  01/27/11   minor non-obs CAD, NL EF, mild pulm HTN   CATARACT EXTRACTION, BILATERAL     last done 12'16-   COLONOSCOPY W/ BIOPSIES AND POLYPECTOMY     COLONOSCOPY WITH PROPOFOL  N/A 02/13/2015   Procedure: COLONOSCOPY WITH PROPOFOL ;  Surgeon: Jolinda Necessary, MD;  Location: [facility];  Service: Endoscopy;  Laterality: N/A;    ESOPHAGOGASTRODUODENOSCOPY (EGD) WITH PROPOFOL  N/A 09/30/2016   Procedure: ESOPHAGOGASTRODUODENOSCOPY (EGD) WITH PROPOFOL ;  Surgeon: Jolinda Necessary, MD;  Location: [facility];  Service: Endoscopy;  Laterality: N/A;   LYMPH NODE DISSECTION Left 04/12/2013   Procedure: LYMPH NODE DISSECTION;  Surgeon: Norita Beauvais, MD;  Location[facility];  Service: Thoracic;  Laterality: Left;   RIGHT/LEFT HEART CATH AND CORONARY ANGIOGRAPHY N/A 10/08/2016   Procedure: RIGHT/LEFT HEART CATH AND CORONARY ANGIOGRAPHY;  Surgeon: Odie Benne, MD;  Location: [facility];  Service: Cardiovascular;  Laterality: N/A;   TONSILLECTOMY     VESICOVAGINAL FISTULA CLOSURE W/ TAH  1971   VIDEO ASSISTED THORACOSCOPY (VATS)/THOROCOTOMY Left 04/12/2013   Procedure: VIDEO ASSISTED THORACOSCOPY (VATS)/THOROCOTOMY, WITH LEFT UPPER LOBE WEDGE RESECTION, CHEST WALL BIOPSY ;  Surgeon: Norita Beauvais, MD;  Location: [facility] OR;  Service: Thoracic;  Laterality: Left;   VIDEO BRONCHOSCOPY N/A 04/12/2013   Procedure: VIDEO BRONCHOSCOPY;  Surgeon: Norita Beauvais, MD;  Location: [facility] OR;  Service: Thoracic;  Laterality: N/A;   Patient Active Problem List   Diagnosis Date Noted   Impacted cerumen of right ear 01/17/2023   Dizziness 01/17/2023   Central perforation of tympanic membrane of right ear 01/17/2023  Other specified disorders of eustachian tube, right ear 01/17/2023   Hepatic lesion 12/20/2021   Chronic systolic heart failure (HCC)    Bradycardia 05/23/2021   Postural dizziness with presyncope 05/23/2021   Acute prerenal azotemia 05/23/2021   Cerebral thrombosis with cerebral infarction 01/02/2021   Encephalopathy acute/expressive aphasia  12/30/2020   Hyponatremia 12/30/2020   GERD (gastroesophageal reflux disease) 12/30/2020   Hypocalcemia 12/30/2020   Acute on chronic combined systolic and diastolic CHF (congestive heart failure) (HCC) 04/28/2019   non obstructive CAD 04/28/2019   Edema 04/14/2019    Abnormal CT of the chest 03/22/2018   Acid reflux 07/13/2017   Affective psychosis (HCC) 07/13/2017   Alcohol dependence in remission (HCC) 07/13/2017   BD (Bowen's disease) 07/13/2017   History of colon polyps 07/13/2017   Rheumatoid arthritis (HCC) 07/13/2017   Chronic bronchitis (HCC) 07/13/2017   Secondary pulmonary hypertension 07/13/2017   Obstructive apnea 07/13/2017   Age related osteoporosis 07/13/2017   Non-ischemic cardiomyopathy/diastolic and systolic CHF    Hyperhidrosis 16/10/9602   Anemia in neoplastic disease 05/02/2013   Allergic rhinitis 04/29/2013   Depression 04/29/2013   Lung cancer, left upper lobe 02/03/2013   Pulmonary infiltrates 01/05/2013   Mild pulmonary hypertension (HCC) 02/17/2011   OSA (obstructive sleep apnea) 02/17/2011   Dyspnea on exertion 11/22/2010   COPD (chronic obstructive pulmonary disease) (HCC) 07/20/2009   Carcinoma in situ of cervix uteri 06/26/2009   Pure hypercholesterolemia 06/26/2009   OBESITY 06/26/2009   Chronic disease anemia 06/26/2009   Essential (primary) hypertension 06/26/2009   ARTHRITIS, RHEUMATOID 06/26/2009   OSTEOPOROSIS 06/26/2009   Cough 06/26/2009    PCP: Benedetto Brady, MD  REFERRING PROVIDER: Albertina Hugger, MD   REFERRING DIAG: R15.9 (ICD-10-CM) - Incontinence of feces, unspecified fecal incontinence type   THERAPY DIAG:  Muscle weakness (generalized)  Other abnormalities of gait and mobility  Other lack of coordination  Rationale for Evaluation and Treatment: Rehabilitation  ONSET DATE: pt reports 5 years  SUBJECTIVE:                                                                                                                                                                                           SUBJECTIVE STATEMENT: Pt reports that she has urinary and fecal incontinence, started about 5 years ago. Had a stroke 2 years ago Pt reports that she can't stand for a long time, has COPD, CHF,  rheumatoid arthritis Cannot cook anymore, her son does it Nobody knows what causes her diarrhea When her poop is regular, she knows when she has to go to the bathroom  Fluid intake: to be asked  PAIN: no pain  PRECAUTIONS: None  RED FLAGS: None   WEIGHT BEARING RESTRICTIONS: No  FALLS:  Has patient fallen in last 6 months? No  OCCUPATION: retired  ACTIVITY LEVEL : not very active as far as exercise since her stroke, drives, goes to appts, goes to the grocery store, plays violin in 2 symphonies, is sure that she knows not being so active has something to do with this   PLOF: Independent with household mobility with device  PATIENT GOALS: to be well, to able to stand up and walk right and not have fecal incontinence, it is frustrating   PERTINENT HISTORY:  Hysterectomy, CHF, COPD Sexual abuse: No  BOWEL MOVEMENT: Sometimes it will happen when she walks around the grocery store, accidents are random, sometimes it just happens, sometimes when she eats something spicy, unpredictable Pain with bowel movement: No Type of bowel movement:Type (Bristol Stool Scale) 3 or 6, when it is 6 she might have an accident, he just changed her medication Fully empty rectum: No- sometimes she does not Leakage: Yes:   Pads: Yes: all the time Fiber supplement/laxative Yes Metamucil- 2 capsules/ day. Will start Immodium tonight  URINATION: has to get up in the night, when she lies down and sits up, she does not have the strength to hold it Pain with urination: No Fully empty bladder: Yes:   Stream: Strong Urgency: Yes  Frequency: when she is going from lying down to sitting up, she leaks, during the day, it's a little bit of leaks, her and there Leakage: Urge to void, Walking to the bathroom, Coughing, and Sneezing Pads: Yes: 2- 3 / day  INTERCOURSE: not active   PREGNANCY: Vaginal deliveries 2- breech and 1 premature, almost died during the delivery Tearing No Episiotomy Yes   C-section deliveries 0 Currently pregnant No  PROLAPSE: None   OBJECTIVE:  Note: Objective measures were completed at Evaluation unless otherwise noted.   PATIENT SURVEYS:    PFIQ-7: to be completed- unable to complete today d/t time restraints  COGNITION: Overall cognitive status: Within functional limits for tasks assessed     SENSATION: Light touch: Appears intact  LUMBAR SPECIAL TESTS:  Single leg stance test: Positive- decreased balance and weakness  GAIT: Assistive device utilized: Single point cane Comments: slow, antalgic  POSTURE: rounded shoulders, forward head, decreased lumbar lordosis, and flexed trunk    LUMBARAROM/PROM: limited throughout  LOWER EXTREMITY [id number] throughout   LOWER EXTREMITY MMT: 4-/5 bilateral hips and knees  PALPATION:   General: difficulty with engaging abdomen and pelvic floor  Pelvic Alignment: even  Abdominal: restrictions throughout abdomen                External Perineal Exam: deferred to next visit                             Internal Pelvic Floor: deferred to next visit  Patient confirms identification and approves PT to assess internal pelvic floor and treatment No  PELVIC MMT:   MMT eval  Vaginal   Internal Anal Sphincter   External Anal Sphincter   Puborectalis   Diastasis Recti no  (Blank rows = not tested)        TONE: Deferred to next visit  PROLAPSE: Deferred to next visit  TODAY'S TREATMENT:  DATE: 05/04/23   EVAL see below   PATIENT EDUCATION:  Education details: HEP, expectations of PT, exam findings Person educated: Patient Education method: Explanation, Demonstration, Tactile cues, Verbal cues, and Handouts Education comprehension: verbalized understanding, returned demonstration, verbal cues required, tactile cues required, and needs further  education  HOME EXERCISE PROGRAM:  [id number]  ASSESSMENT:  CLINICAL IMPRESSION: Patient is a 83 y.o. F who was seen today for physical therapy evaluation and treatment for mixed urinary incontinence and fecal incontinence. She dem decreased balance, bilateral lower extremity weakness, decreased balance, decreased core strength, decreased mobility and diarrhea. Pt with breathholding tendencies. Pt hard of hearing and limited eval completed today   OBJECTIVE IMPAIRMENTS: Abnormal gait, cardiopulmonary status limiting activity, decreased activity tolerance, decreased balance, decreased cognition, decreased coordination, decreased mobility, difficulty walking, decreased ROM, decreased strength, hypomobility, impaired flexibility, impaired tone, postural dysfunction, and pain.   ACTIVITY LIMITATIONS: carrying, lifting, bending, sitting, standing, squatting, sleeping, stairs, transfers, continence, toileting, reach over head, locomotion level, and caring for others  PARTICIPATION LIMITATIONS: meal prep, cleaning, laundry, personal finances, driving, shopping, community activity, occupation, yard work, and church  PERSONAL FACTORS: Age, Fitness, Time since onset of injury/illness/exacerbation, and 3+ comorbidities: CHF, COPD, rheumatoid arthritis  are also affecting patient's functional outcome.   REHAB POTENTIAL: Fair    CLINICAL DECISION MAKING: Evolving/moderate complexity  EVALUATION COMPLEXITY: Moderate   GOALS: Goals reviewed with patient? Yes  SHORT TERM GOALS: Target date: 06/01/2023    Pt will be independent with HEP.   Baseline: Goal status: INITIAL  2.  Pt will be able to stand at least 10 mins without increased hip, knee and back pain Baseline:  Goal status: INITIAL  3.  Pt will report reducing fecal accidents to at most once/ week Baseline:  Goal status: INITIAL  4.  Pt will report reducing urinary leaking to max  once/ week Baseline:  Goal status: INITIAL   LONG  TERM GOALS: Target date: 07/27/2023  Pt will be independent with advanced HEP.   Baseline:  Goal status: INITIAL  2.  Pt will be independent with use of squatty potty, relaxed toileting mechanics, and improved bowel movement techniques in order to increase ease of bowel movements and complete evacuation.   Baseline:  Goal status: INITIAL  3.  Pt will be able to functional actions such as transfer in bed from supine to sit  without leakage  Baseline:  Goal status: INITIAL  4.  Pt will have no fecal or urinary accidents/ week to be able to participate in community activities Baseline:  Goal status: INITIAL   PLAN:  PT FREQUENCY: 1-2x/week  PT DURATION: 12 weeks  PLANNED INTERVENTIONS: 97110-Therapeutic exercises, 97530- Therapeutic activity, 97112- Neuromuscular re-education, 97535- Self Care, [id number]- Manual therapy, [phone]- Gait training, [phone]- Electrical stimulation (manual), Patient/Family education, Stair training, Taping, Dry Needling, Joint mobilization, Joint manipulation, Spinal manipulation, Spinal mobilization, Scar mobilization, Cryotherapy, Moist heat, and Biofeedback  PLAN FOR NEXT SESSION: internal and cont exercises   Diezel Mazur, PT 05/04/2023, 5:46 PM

## 2023-05-04 NOTE — Progress Notes (Signed)
 TIFFANYAMBER SZYMANSKI [id number] 06/21/1940   Chief Complaint: Diarrhea, fecal incontinence  Referring Provider: Benedetto Brady, MD Primary GI MD: Dr. Dominic Friendly   HPI: Taylor Berry is a 83 y.o. female, previous patient of Dr. Savannah Curlin, with past medical history of HTN, CHF, secondary pulmonary HTN, CAD, h/o cerebral thrombosis, COPD, OSA, h/o lung and cervical cancer, anemia, GERD, colon polyps, who presents today for a complaint of diarrhea and fecal incontinence .    Patient last seen in office with Dr. Savannah Curlin 03/2021 for complaint of diarrhea and fecal incontinence. She has had workup for this in the past at [facility] as well (Dr. Alvin Axe. Honey Lusty). She was referred by them to [facility] after declining colonoscopy but did not keep that appointment. Patient declined to have any further colonoscopy because she feels she is too high risk.   At last visit, plan was to increase Metamucil to BID dosing and trial cholestyramine  4g BID. Stool tests ordered including GI pathogen panel, fecal calprotectin, and fecal elastase, all of which were normal.  She has history of liver lesion identified on CT and confirmed on MRI. By MRI, most likely hemangioma versus FNH. There were no associated symptoms at the time. Given the potential increase in size compared to 2018, repeat imaging was recommended in 6 months from last visit, which patient declined at the time due to cost.   Today, patient states her fecal incontinence is unpredictable. She has a bowel movement typically once a day which is often loose, but not watery. She has fecal incontinence on average 4x/week, usually when she is out walking, running errands, etc. She also reports urinary incontinence. She does not have a full bowel movement with the incontinence and is usually able to get to a bathroom to empty her bowels, but says it is more than just a little bit of leakage.  She takes 2 Metamucil tablets in the morning and 2 colestipol  tablets (1mg  each)  at night. This does help somewhat, but she is still having troublesome incontinence. She tried the cholestyramine  powder and did not notice much improvement, and her copay for the powder was too expensive, so she switched back to colestipol .  She again reiterates that she does not want to have a colonoscopy at her age due to procedure risk.  She has not been evaluated by pelvic floor physical therapy.  (10 minutes late for visit)  Previous GI Procedures   Colonoscopy 02/13/2015 (Dr. Denece Finger) - Negative for polyps - Extensive pan-diverticulosis - Internal hemorrhoids - Recall not recommended due to age  EGD 09/30/2016 (Dr. Denece Finger) - Small hiatal hernia - Normal mucosa in the entire stomach - Nodule found in duodenum, biopsied - Abnormal imaging, normal endoscopic exam.   Past Medical History:  Diagnosis Date   Abnormal CT scan    Allergic rhinitis    Anemia 2015   after lung surgery   Angio-edema    Complication of anesthesia    Fentanyl  allergy, "claustrophobia"   COPD (chronic obstructive pulmonary disease) (HCC)    Depression    Diaphoresis 07/30/2015   Excessive sweating with little exertion   Difficult intravenous access    GERD (gastroesophageal reflux disease)    In the past   Hyperlipidemia    Hypertension    Left bundle branch block    Lung cancer (HCC) 2015   left upper lobe wedge removed-no further tx.   Neuropathy    Tingling in arms, fingers, and feet (Since 06/15/16)   Obesity  OSA (obstructive sleep apnea)    denies.   Osteoarthritis    ra also   Osteoporosis    Recurrent upper respiratory infection (URI)    Rheumatoid arthritis(714.0)    Dr. Ebbie Goldmann   SOB (shortness of breath) on exertion    Uterine cancer (HCC) 1971   tx surgical    Past Surgical History:  Procedure Laterality Date   ABDOMINAL HYSTERECTOMY     in situ carcinoma partial   CARDIAC CATHETERIZATION  01/27/11   minor non-obs CAD, NL EF, mild pulm HTN   CATARACT EXTRACTION,  BILATERAL     last done 12'16-   COLONOSCOPY W/ BIOPSIES AND POLYPECTOMY     COLONOSCOPY WITH PROPOFOL  N/A 02/13/2015   Procedure: COLONOSCOPY WITH PROPOFOL ;  Surgeon: Jolinda Necessary, MD;  Location: [facility];  Service: Endoscopy;  Laterality: N/A;   ESOPHAGOGASTRODUODENOSCOPY (EGD) WITH PROPOFOL  N/A 09/30/2016   Procedure: ESOPHAGOGASTRODUODENOSCOPY (EGD) WITH PROPOFOL ;  Surgeon: Jolinda Necessary, MD;  Location: [facility];  Service: Endoscopy;  Laterality: N/A;   LYMPH NODE DISSECTION Left 04/12/2013   Procedure: LYMPH NODE DISSECTION;  Surgeon: Norita Beauvais, MD;  Location: [facility] OR;  Service: Thoracic;  Laterality: Left;   RIGHT/LEFT HEART CATH AND CORONARY ANGIOGRAPHY N/A 10/08/2016   Procedure: RIGHT/LEFT HEART CATH AND CORONARY ANGIOGRAPHY;  Surgeon: Odie Benne, MD;  Location: [facility] INVASIVE CV [facility];  Service: Cardiovascular;  Laterality: N/A;   TONSILLECTOMY     VESICOVAGINAL FISTULA CLOSURE W/ TAH  1971   VIDEO ASSISTED THORACOSCOPY (VATS)/THOROCOTOMY Left 04/12/2013   Procedure: VIDEO ASSISTED THORACOSCOPY (VATS)/THOROCOTOMY, WITH LEFT UPPER LOBE WEDGE RESECTION, CHEST WALL BIOPSY ;  Surgeon: Norita Beauvais, MD;  Location: [facility] OR;  Service: Thoracic;  Laterality: Left;   VIDEO BRONCHOSCOPY N/A 04/12/2013   Procedure: VIDEO BRONCHOSCOPY;  Surgeon: Norita Beauvais, MD;  Location: [facility] OR;  Service: Thoracic;  Laterality: N/A;    Current Outpatient Medications  Medication Sig Dispense Refill   albuterol  (VENTOLIN  HFA) 108 (90 Base) MCG/ACT inhaler Inhale 2 puffs into the lungs every 4 (four) hours as needed for wheezing or shortness of breath. (Patient not taking: Reported on 04/06/2023) 6.7 g 1   atorvastatin  (LIPITOR) 40 MG tablet Take 1 tablet (40 mg total) by mouth daily. 90 tablet 3   Calcium  Carb-Cholecalciferol (CALCIUM  + D3 PO) Take 1 tablet by mouth daily.     CALCIUM -VITAMIN D  PO Take 1 tablet by mouth daily.     candesartan  (ATACAND ) 8 MG tablet Take 1 tablet (8 mg  total) by mouth daily. 90 tablet 2   chlorhexidine  (PERIOGARD ) 0.12 % solution Gently rinse mouth with 15 mLs , Rinse 2 (two) times daily. Spit after use. 473 mL 0   cholestyramine  (QUESTRAN ) 4 GM/DOSE powder Take 4 g by mouth daily.     clopidogrel  (PLAVIX ) 75 MG tablet Take 1 tablet (75 mg total) by mouth daily. 90 tablet 3   colestipol  (COLESTID ) 1 g tablet Take 2 g by mouth daily.     Cyanocobalamin  (VITAMIN B12) 1000 MCG TBCR Take 1 tablet by mouth daily.     denosumab  (PROLIA ) 60 MG/ML SOSY injection Inject 60 mg into the skin every 6 (six) months.     fluticasone  (FLONASE ) 50 MCG/ACT nasal spray Place 1-2 sprays into both nostrils daily. 16 g 5   fluticasone  (FLONASE ) 50 MCG/ACT nasal spray Place 1-2 sprays into both nostrils daily. 16 g 5   Fluticasone -Umeclidin-Vilant (TRELEGY ELLIPTA ) 100-62.5-25 MCG/ACT AEPB Inhale 1 puff into the lungs daily. 28  each 5   Fluticasone -Umeclidin-Vilant (TRELEGY ELLIPTA ) 100-62.5-25 MCG/ACT AEPB Inhale 200 mcg into the lungs daily. 2 each    Fluticasone -Umeclidin-Vilant (TRELEGY ELLIPTA ) 100-62.5-25 MCG/ACT AEPB Inhale 1 puff into the lungs daily.     furosemide  (LASIX ) 20 MG tablet Take 1 tablet (20 mg total) by mouth daily. 90 tablet 3   leflunomide  (ARAVA ) 20 MG tablet Take 1 tablet (20 mg total) by mouth daily. 90 tablet 1   meclizine  (ANTIVERT ) 25 MG tablet Take 1 tablet (25 mg total) by mouth up to 3 (three) times daily as needed for vertigo. (Patient not taking: Reported on 04/06/2023) 20 tablet 0   meclizine  (ANTIVERT ) 25 MG tablet Take 1 tablet (25 mg total) by mouth up to 3 (three) times daily as needed for vertigo. 20 tablet 0   meloxicam  (MOBIC ) 7.5 MG tablet Take 1 tablet (7.5 mg total) by mouth daily. 60 tablet 3   METAMUCIL FIBER PO Take by mouth See admin instructions. Mix 1 tablespoonful of powder into 4-8 ounces of desired beverage and drink once a day     predniSONE  (DELTASONE ) 20 MG tablet Take 40 mg by mouth daily. (Patient not taking:  Reported on 04/06/2023)     predniSONE  (DELTASONE ) 5 MG tablet Take 3 tablets by mouth once a day for 7 days, then take 2 tablets once a day for 7 days, then take 1 tablet once a day for 14 days (Patient not taking: Reported on 04/06/2023) 49 tablet 3   traMADol  (ULTRAM ) 50 MG tablet Take 1 tablet (50 mg total) by mouth every 4-6 (four to six) hours as needed for pain. (Patient not taking: Reported on 04/06/2023) 6 tablet 0   No current facility-administered medications for this visit.    Allergies as of 05/04/2023 - Review Complete 04/29/2023  Allergen Reaction Noted   Latex Itching and Rash 06/27/2009   Metoprolol  tartrate Other (See Comments) and Shortness Of Breath 05/23/2021   Ace inhibitors Cough 10/30/2020   Angiotensin receptor blockers Swelling and Other (See Comments) 10/30/2020   Fentanyl  Other (See Comments) 01/30/2015   Guaifenesin  er Diarrhea 11/04/2022   Hydrochlorothiazide Other (See Comments) 10/30/2020   Hydroxychloroquine Other (See Comments) 12/30/2020   Penicillamine Nausea And Vomiting 11/13/2021   Penicillins Other (See Comments) 10/30/2020    Family History  Problem Relation Age of Onset   Lung cancer Mother    Coronary artery disease Son    Heart attack Son 96   Arthritis/Rheumatoid Maternal Grandmother    Coronary artery disease Son    Arthritis/Rheumatoid Son     Social History   Tobacco Use   Smoking status: Former    Current packs/day: 0.00    Average packs/day: 1 pack/day for 35.0 years (35.0 ttl pk-yrs)    Types: Cigarettes    Start date: 01/06/1953    Quit date: 01/07/1988    Years since quitting: 35.3   Smokeless tobacco: Never  Vaping Use   Vaping status: Never Used  Substance Use Topics   Alcohol use: No    Comment: not since 1988   Drug use: No     Review of Systems:    Constitutional: No weight loss, fever, chills, weakness or fatigue HEENT: Eyes: No change in vision Ears, Nose, Throat:  No change in hearing or congestion Skin: No  rash or itching Cardiovascular: No chest pain, chest pressure or palpitations   Respiratory: No SOB or cough Gastrointestinal: See HPI and otherwise negative Genitourinary: urinary incontinence Neurological: No headache, dizziness or syncope  Musculoskeletal: No new muscle or joint pain    Physical Exam:  Vital signs: BP 122/74   Pulse 62   Ht 5\' 2"  (1.575 m)   Wt 149 lb (67.6 kg)   BMI 27.25 kg/m    Constitutional: NAD, Well developed, Well nourished, alert and cooperative, walks with a cane Head:  Normocephalic and atraumatic. Eyes:  EOMs intact. No scleral icterus. Conjunctiva pink. Respiratory: Respirations even and unlabored. Lungs clear to auscultation bilaterally.  No wheezes, crackles, or rhonchi.  Cardiovascular:  Regular rate and rhythm. No peripheral edema, cyanosis or pallor.  Gastrointestinal:  Soft, nondistended, nontender. No rebound or guarding. Normal bowel sounds. No appreciable masses or hepatomegaly. Rectal:  Not performed.  Neurologic:  Alert and oriented x4;  grossly normal neurologically.  Skin:   Dry and intact without significant lesions or rashes. Psychiatric: Oriented to person, place and time. Demonstrates good judgement and reason without abnormal affect or behaviors.   RELEVANT LABS AND IMAGING: CBC    Component Value Date/Time   WBC 6.7 06/10/2021 1045   WBC 4.8 05/26/2021 0255   RBC 3.58 (L) 06/10/2021 1045   RBC 3.13 (L) 05/26/2021 0255   HGB 11.0 (L) 06/10/2021 1045   HGB 12.4 07/29/2016 1306   HCT 33.4 (L) 06/10/2021 1045   HCT 39.0 07/29/2016 1306   PLT 252 06/10/2021 1045   MCV 93 06/10/2021 1045   MCV 93.3 07/29/2016 1306   MCH 30.7 06/10/2021 1045   MCH 30.4 05/26/2021 0255   MCHC 32.9 06/10/2021 1045   MCHC 32.0 05/26/2021 0255   RDW 13.0 06/10/2021 1045   RDW 14.5 07/29/2016 1306   LYMPHSABS 0.9 05/24/2021 0045   LYMPHSABS 1.3 02/13/2021 1615   LYMPHSABS 1.2 07/29/2016 1306   MONOABS 0.7 05/24/2021 0045   MONOABS 0.8  07/29/2016 1306   EOSABS 0.2 05/24/2021 0045   EOSABS 0.2 02/13/2021 1615   BASOSABS 0.0 05/24/2021 0045   BASOSABS 0.0 02/13/2021 1615   BASOSABS 0.1 07/29/2016 1306    CMP     Component Value Date/Time   NA 137 05/26/2021 0255   NA 134 02/13/2021 1615   NA 139 07/29/2016 1306   K 4.0 05/26/2021 0255   K 4.0 07/29/2016 1306   CL 110 05/26/2021 0255   CO2 22 05/26/2021 0255   CO2 27 07/29/2016 1306   GLUCOSE 95 05/26/2021 0255   GLUCOSE 94 07/29/2016 1306   BUN 17 05/26/2021 0255   BUN 24 02/13/2021 1615   BUN 18.8 07/29/2016 1306   CREATININE 0.81 05/26/2021 0255   CREATININE 0.9 07/29/2016 1306   CALCIUM  9.0 05/26/2021 0255   CALCIUM  8.8 12/30/2020 2051   CALCIUM  9.8 07/29/2016 1306   PROT 5.8 (L) 05/24/2021 0045   PROT 6.7 10/03/2016 1015   PROT 7.4 07/29/2016 1306   ALBUMIN 3.0 (L) 05/24/2021 0045   ALBUMIN 4.0 10/03/2016 1015   ALBUMIN 3.6 07/29/2016 1306   AST 19 05/24/2021 0045   AST 21 07/29/2016 1306   ALT 9 05/24/2021 0045   ALT 8 07/29/2016 1306   ALKPHOS 31 (L) 05/24/2021 0045   ALKPHOS 42 07/29/2016 1306   BILITOT 0.5 05/24/2021 0045   BILITOT 0.5 10/03/2016 1015   BILITOT 0.33 07/29/2016 1306   GFRNONAA >60 05/26/2021 0255   GFRAA 72 02/21/2020 1040    MR Abd w/wo contrast 11/09/2020 IMPRESSION: 1. There is a 2.3 x 1.7 cm lesion of hepatic segment V, abutting the gallbladder fossa. This demonstrates brisk arterial hyperenhancement, with fade to parenchymal  background on multiphasic sequences and some evidence of a central internal scar. Imaging characteristics are most suggestive of focal nodular hyperplasia or perhaps a flash filling hemangioma, and this lesion was very subtly present on prior CT dated 08/08/2016 although has increased in size and conspicuity. Consider follow-up imaging in 6 months to establish ongoing stability, as hepatocellular carcinoma is difficult to strictly exclude in such a lesion. Lung cancer metastasis,  given patient history, is however distinctly not favored with this appearance and contrast enhancement. 2. Cholelithiasis. 3. Colonic diverticulosis.  Echo 06/27/2022 - LVEF 50-55% - Aortic valve sclerosis without evidence of aortic valve stenosis  Assessment/Plan:   Diarrhea, fecal incontinence Ongoing for years. Stool studies at last visit with Dr. Savannah Curlin all negative (fecal cal, GI path panel, fecal elastase). Patient has previously declined repeat colonoscopy due to procedural risk.  In 2023 was started on cholestyramine  4g/day and Metamucil increased to BID.  Now on colestipol  due to high cost of cholestyramine  powder, did not notice any difference in symptoms between the two medications.  She is having one bowel movement most days which are loose but not watery, and incontinence 4x/week on average, usually when she is out walking or running errands.  Considering patient still has her gallbladder and symptoms have been persistent on colestipol , will stop this and trial antidiarrheal.  Recommend OTC imodium (2mg  tablets). Start with 1/2 tablet daily, and if still having loose stools and not developing constipation can increase to full tablet.  Patient's main concern is fecal incontinence. She also has urinary incontinence.  Will send referral to pelvic floor PT for evaluation of likely pelvic floor dysfunction.   Liver lesion Likely hemangioma vs FNH seen on MRI 11/2020 with 6 month follow-up imaging recommended to establish stability. Does not appear she has had this done, declined due to cost. Consider repeat MRI. Discuss again at follow up.   Valiant Gaul, PA-C [facility] 05/04/2023, 8:03 AM  The APP and I saw this patient together, with the APP acting as my scribe.  I have reviewed the above documentation for accuracy and completeness and I agree with the above documentation. Stools still loose/semiformed most of the time, but it is the urgency and incontinence  that seems to be bothering her the most. Probably not microscopic colitis based on the history, but hard to say for sure since she is on colestipol  right now and does not wish to have a colonoscopy with understandable concerns regarding the risks. Will try to change in treatment that may be simpler and hopefully more effective to give the stools more consistent formed, and she is interested in being evaluated for pelvic floor physical therapy.  Not sure if she would be a candidate for neuro interested in colorectal surgery interventions for incontinence such as a stimulator since there are invasive procedures.  Henry L. Danis, MD   35 minutes were spent on this encounter, including in depth chart review, independent review of results as outlined above, communicating results with the patient directly, face-to-face time with the patient, coordinating care, ordering studies and medications as appropriate, and documentation.    Patient Care Team: Benedetto Brady, MD as PCP - General (Family Medicine) Audery Blazing Deannie Fabian, MD as PCP - Cardiology (Cardiology) Baldwin Levee Delora Ferry, MD as Consulting Physician (Pulmonary Disease) Alanson Alliance, MD as Consulting Physician (Rheumatology)

## 2023-05-04 NOTE — Patient Instructions (Signed)
 Stop Colestipol .   Please purchase the following medications over the counter and take as directed: Imodium ( 2mg  tablets) Take 1/2 tablet in the morning and monitor for constipation. If you are still having loose stools you can take full tablet.   We are referring you to Pelvic Floor PT.  They will contact you directly to schedule an appointment.  It may take a week or more before you hear from them.  Please feel free to contact us  if you have not heard from them within 2 weeks and we will follow up on the referral.   Due to recent changes in healthcare laws, you may see the results of your imaging and laboratory studies on MyChart before your provider has had a chance to review them.  We understand that in some cases there may be results that are confusing or concerning to you. Not all laboratory results come back in the same time frame and the provider may be waiting for multiple results in order to interpret others.  Please give us  48 hours in order for your provider to thoroughly review all the results before contacting the office for clarification of your results.   Thank you for choosing me and [facility].  Dr.Henry Dominic Friendly

## 2023-05-05 ENCOUNTER — Other Ambulatory Visit (HOSPITAL_COMMUNITY): Payer: Self-pay

## 2023-05-06 DIAGNOSIS — E7800 Pure hypercholesterolemia, unspecified: Secondary | ICD-10-CM | POA: Diagnosis not present

## 2023-05-06 DIAGNOSIS — I6930 Unspecified sequelae of cerebral infarction: Secondary | ICD-10-CM | POA: Diagnosis not present

## 2023-05-06 DIAGNOSIS — E559 Vitamin D deficiency, unspecified: Secondary | ICD-10-CM | POA: Diagnosis not present

## 2023-05-06 DIAGNOSIS — D649 Anemia, unspecified: Secondary | ICD-10-CM | POA: Diagnosis not present

## 2023-05-06 LAB — LAB REPORT - SCANNED
A1c: 5.2
EGFR: 77

## 2023-05-07 DIAGNOSIS — I6930 Unspecified sequelae of cerebral infarction: Secondary | ICD-10-CM | POA: Diagnosis not present

## 2023-05-07 DIAGNOSIS — J449 Chronic obstructive pulmonary disease, unspecified: Secondary | ICD-10-CM | POA: Diagnosis not present

## 2023-05-07 DIAGNOSIS — I5020 Unspecified systolic (congestive) heart failure: Secondary | ICD-10-CM | POA: Diagnosis not present

## 2023-05-07 DIAGNOSIS — D84821 Immunodeficiency due to drugs: Secondary | ICD-10-CM | POA: Diagnosis not present

## 2023-05-07 DIAGNOSIS — G4733 Obstructive sleep apnea (adult) (pediatric): Secondary | ICD-10-CM | POA: Diagnosis not present

## 2023-05-07 DIAGNOSIS — E7800 Pure hypercholesterolemia, unspecified: Secondary | ICD-10-CM | POA: Diagnosis not present

## 2023-05-07 DIAGNOSIS — I110 Hypertensive heart disease with heart failure: Secondary | ICD-10-CM | POA: Diagnosis not present

## 2023-05-07 DIAGNOSIS — M069 Rheumatoid arthritis, unspecified: Secondary | ICD-10-CM | POA: Diagnosis not present

## 2023-05-07 DIAGNOSIS — D72819 Decreased white blood cell count, unspecified: Secondary | ICD-10-CM | POA: Diagnosis not present

## 2023-05-07 DIAGNOSIS — D649 Anemia, unspecified: Secondary | ICD-10-CM | POA: Diagnosis not present

## 2023-05-07 DIAGNOSIS — M810 Age-related osteoporosis without current pathological fracture: Secondary | ICD-10-CM | POA: Diagnosis not present

## 2023-05-07 DIAGNOSIS — E559 Vitamin D deficiency, unspecified: Secondary | ICD-10-CM | POA: Diagnosis not present

## 2023-05-11 ENCOUNTER — Encounter

## 2023-05-11 DIAGNOSIS — Z1231 Encounter for screening mammogram for malignant neoplasm of breast: Secondary | ICD-10-CM | POA: Diagnosis not present

## 2023-05-12 ENCOUNTER — Ambulatory Visit: Attending: Gastroenterology | Admitting: Physical Therapy

## 2023-05-12 ENCOUNTER — Ambulatory Visit: Admitting: Physical Therapy

## 2023-05-12 ENCOUNTER — Encounter: Payer: Self-pay | Admitting: Physical Therapy

## 2023-05-12 DIAGNOSIS — R278 Other lack of coordination: Secondary | ICD-10-CM | POA: Insufficient documentation

## 2023-05-12 DIAGNOSIS — M6281 Muscle weakness (generalized): Secondary | ICD-10-CM | POA: Diagnosis not present

## 2023-05-12 DIAGNOSIS — R2689 Other abnormalities of gait and mobility: Secondary | ICD-10-CM | POA: Diagnosis not present

## 2023-05-12 NOTE — Therapy (Signed)
 OUTPATIENT PHYSICAL THERAPY FEMALE PELVIC TREATMENT   Patient Name: Taylor Berry MRN: [id number] DOB:1940-01-25, 83 y.o., female Today's Date: 05/12/2023  END OF SESSION:  PT End of Session - 05/12/23 0936     Visit Number 2    Authorization Type [employer]    Authorization Time Period 07/26/2013    PT Start Time 0930    PT Stop Time 1015    PT Time Calculation (min) 45 min    Activity Tolerance Patient tolerated treatment well    Behavior During Therapy [facility] for tasks assessed/performed              Past Medical History:  Diagnosis Date   Abnormal CT scan    Allergic rhinitis    Anemia 2015   after lung surgery   Angio-edema    Complication of anesthesia    Fentanyl  allergy, "claustrophobia"   COPD (chronic obstructive pulmonary disease) (HCC)    Depression    Diaphoresis 07/30/2015   Excessive sweating with little exertion   Difficult intravenous access    GERD (gastroesophageal reflux disease)    In the past   Hyperlipidemia    Hypertension    Left bundle branch block    Lung cancer (HCC) 2015   left upper lobe wedge removed-no further tx.   Neuropathy    Tingling in arms, fingers, and feet (Since 06/15/16)   Obesity    OSA (obstructive sleep apnea)    denies.   Osteoarthritis    ra also   Osteoporosis    Recurrent upper respiratory infection (URI)    Rheumatoid arthritis(714.0)    Dr. Ebbie Goldmann   SOB (shortness of breath) on exertion    Uterine cancer (HCC) 1971   tx surgical   Past Surgical History:  Procedure Laterality Date   ABDOMINAL HYSTERECTOMY     in situ carcinoma partial   CARDIAC CATHETERIZATION  01/27/11   minor non-obs CAD, NL EF, mild pulm HTN   CATARACT EXTRACTION, BILATERAL     last done 12'16-   COLONOSCOPY W/ BIOPSIES AND POLYPECTOMY     COLONOSCOPY WITH PROPOFOL  N/A 02/13/2015   Procedure: COLONOSCOPY WITH PROPOFOL ;  Surgeon: Jolinda Necessary, MD;  Location: [facility];  Service: Endoscopy;  Laterality: N/A;    ESOPHAGOGASTRODUODENOSCOPY (EGD) WITH PROPOFOL  N/A 09/30/2016   Procedure: ESOPHAGOGASTRODUODENOSCOPY (EGD) WITH PROPOFOL ;  Surgeon: Jolinda Necessary, MD;  Location: [facility];  Service: Endoscopy;  Laterality: N/A;   LYMPH NODE DISSECTION Left 04/12/2013   Procedure: LYMPH NODE DISSECTION;  Surgeon: Norita Beauvais, MD;  Location: [facility] OR;  Service: Thoracic;  Laterality: Left;   RIGHT/LEFT HEART CATH AND CORONARY ANGIOGRAPHY N/A 10/08/2016   Procedure: RIGHT/LEFT HEART CATH AND CORONARY ANGIOGRAPHY;  Surgeon: Odie Benne, MD;  Location: [facility] INVASIVE CV LAB;  Service: Cardiovascular;  Laterality: N/A;   TONSILLECTOMY     VESICOVAGINAL FISTULA CLOSURE W/ TAH  1971   VIDEO ASSISTED THORACOSCOPY (VATS)/THOROCOTOMY Left 04/12/2013   Procedure: VIDEO ASSISTED THORACOSCOPY (VATS)/THOROCOTOMY, WITH LEFT UPPER LOBE WEDGE RESECTION, CHEST WALL BIOPSY ;  Surgeon: Norita Beauvais, MD;  Location: [facility] OR;  Service: Thoracic;  Laterality: Left;   VIDEO BRONCHOSCOPY N/A 04/12/2013   Procedure: VIDEO BRONCHOSCOPY;  Surgeon: Norita Beauvais, MD;  Location: [facility] OR;  Service: Thoracic;  Laterality: N/A;   Patient Active Problem List   Diagnosis Date Noted   Impacted cerumen of right ear 01/17/2023   Dizziness 01/17/2023   Central perforation of tympanic membrane of right ear 01/17/2023  Other specified disorders of eustachian tube, right ear 01/17/2023   Hepatic lesion 12/20/2021   Chronic systolic heart failure (HCC)    Bradycardia 05/23/2021   Postural dizziness with presyncope 05/23/2021   Acute prerenal azotemia 05/23/2021   Cerebral thrombosis with cerebral infarction 01/02/2021   Encephalopathy acute/expressive aphasia  12/30/2020   Hyponatremia 12/30/2020   GERD (gastroesophageal reflux disease) 12/30/2020   Hypocalcemia 12/30/2020   Acute on chronic combined systolic and diastolic CHF (congestive heart failure) (HCC) 04/28/2019   non obstructive CAD 04/28/2019   Edema 04/14/2019    Abnormal CT of the chest 03/22/2018   Acid reflux 07/13/2017   Affective psychosis (HCC) 07/13/2017   Alcohol dependence in remission (HCC) 07/13/2017   BD (Bowen's disease) 07/13/2017   History of colon polyps 07/13/2017   Rheumatoid arthritis (HCC) 07/13/2017   Chronic bronchitis (HCC) 07/13/2017   Secondary pulmonary hypertension 07/13/2017   Obstructive apnea 07/13/2017   Age related osteoporosis 07/13/2017   Non-ischemic cardiomyopathy/diastolic and systolic CHF    Hyperhidrosis 16/10/9602   Anemia in neoplastic disease 05/02/2013   Allergic rhinitis 04/29/2013   Depression 04/29/2013   Lung cancer, left upper lobe 02/03/2013   Pulmonary infiltrates 01/05/2013   Mild pulmonary hypertension (HCC) 02/17/2011   OSA (obstructive sleep apnea) 02/17/2011   Dyspnea on exertion 11/22/2010   COPD (chronic obstructive pulmonary disease) (HCC) 07/20/2009   Carcinoma in situ of cervix uteri 06/26/2009   Pure hypercholesterolemia 06/26/2009   OBESITY 06/26/2009   Chronic disease anemia 06/26/2009   Essential (primary) hypertension 06/26/2009   ARTHRITIS, RHEUMATOID 06/26/2009   OSTEOPOROSIS 06/26/2009   Cough 06/26/2009    PCP: Benedetto Brady, MD  REFERRING PROVIDER: Albertina Hugger, MD   REFERRING DIAG: R15.9 (ICD-10-CM) - Incontinence of feces, unspecified fecal incontinence type   THERAPY DIAG:  Muscle weakness (generalized)  Other lack of coordination  Other abnormalities of gait and mobility  Rationale for Evaluation and Treatment: Rehabilitation  ONSET DATE: pt reports 5 years  SUBJECTIVE:                                                                                                                                                                                           SUBJECTIVE STATEMENT: Pt reports that she was not prepared for internal today Reports that incontinence started 5 years ago  She thought it might be changes in medications Dr did not think it was  the medicine A lot of times it is just there- in the pad She reports that she has started Immodium and Metamucil and is now thinking that it constipated her. She is doing a half a tablet  every other day and it is working.  Lower back pain is really bothering her, if feels like sciatica. She had a bunch of rehersals, when she goes to [place], she has to drive and sit a lot, it aggravated it.    Fluid intake: coffee, water  PAIN: no pain  PRECAUTIONS: None  RED FLAGS: None   WEIGHT BEARING RESTRICTIONS: No  FALLS:  Has patient fallen in last 6 months? No  OCCUPATION: retired  ACTIVITY LEVEL : not very active as far as exercise since her stroke, drives, goes to appts, goes to the grocery store, plays violin in 2 symphonies, is sure that she knows not being so active has something to do with this   PLOF: Independent with household mobility with device  PATIENT GOALS: to be well, to able to stand up and walk right and not have fecal incontinence, it is frustrating   PERTINENT HISTORY:  Hysterectomy, CHF, COPD Sexual abuse: No  BOWEL MOVEMENT: Sometimes it will happen when she walks around the grocery store, accidents are random, sometimes it just happens, sometimes when she eats something spicy, unpredictable Pain with bowel movement: No Type of bowel movement:Type (Bristol Stool Scale) 3 or 6, when it is 6 she might have an accident, he just changed her medication Fully empty rectum: No- sometimes she does not Leakage: Yes:   Pads: Yes: all the time Fiber supplement/laxative Yes Metamucil- 2 capsules/ day. Will start Immodium tonight  URINATION: has to get up in the night, when she lies down and sits up, she does not have the strength to hold it Pain with urination: No Fully empty bladder: Yes:   Stream: Strong Urgency: Yes  Frequency: when she is going from lying down to sitting up, she leaks, during the day, it's a little bit of leaks, her and there Leakage: Urge to  void, Walking to the bathroom, Coughing, and Sneezing Pads: Yes: 2- 3 / day  INTERCOURSE: not active   PREGNANCY: Vaginal deliveries 2- breech and 1 premature, almost died during the delivery Tearing No Episiotomy Yes  C-section deliveries 0 Currently pregnant No  PROLAPSE: None   OBJECTIVE:  Note: Objective measures were completed at Evaluation unless otherwise noted.   PATIENT SURVEYS:    PFIQ-7: 119  COGNITION: Overall cognitive status: Within functional limits for tasks assessed     SENSATION: Light touch: Appears intact  LUMBAR SPECIAL TESTS:  Single leg stance test: Positive- decreased balance and weakness  GAIT: Assistive device utilized: Single point cane Comments: slow, antalgic  POSTURE: rounded shoulders, forward head, decreased lumbar lordosis, and flexed trunk    LUMBARAROM/PROM: limited throughout  LOWER EXTREMITY NGE:[id number] throughout   LOWER EXTREMITY MMT: 4-/5 bilateral hips and knees  PALPATION:   General: difficulty with engaging abdomen and pelvic floor  Pelvic Alignment: even  Abdominal: restrictions throughout abdomen                External Perineal Exam: deferred to next visit                             Internal Pelvic Floor: deferred to next visit  Patient confirms identification and approves PT to assess internal pelvic floor and treatment No  PELVIC MMT:   MMT eval  Vaginal   Internal Anal Sphincter   External Anal Sphincter   Puborectalis   Diastasis Recti no  (Blank rows = not tested)        TONE:  Deferred to next visit  PROLAPSE: Deferred to next visit  TODAY'S TREATMENT:                                                                                                                              DATE: 05/12/23   EVAL see below  Manual- STM - l piriformis and left glute Neuro reed- ball press with pelvic floor lift, with transverse abdominis breath There ex- Piriformis stretch QL stretch PATIENT  EDUCATION:  Education details: HEP, expectations of PT, exam findings Person educated: Patient Education method: Explanation, Demonstration, Tactile cues, Verbal cues, and Handouts Education comprehension: verbalized understanding, returned demonstration, verbal cues required, tactile cues required, and needs further education  HOME EXERCISE PROGRAM:  [id number]  ASSESSMENT:  CLINICAL IMPRESSION: Pt with significant left piriformis and lumbar paraspinals and left flute tightness and pain, no pain with straight leg raise. Did well with piriformis stretches and added ball press with pelvic floor lift. Not agreeable to pelvic muscle internal exam today. She has decreased energy and difficulty breathing, walks with a cane. O2 sats 98%.  She will continue to benefit from PT   OBJECTIVE IMPAIRMENTS: Abnormal gait, cardiopulmonary status limiting activity, decreased activity tolerance, decreased balance, decreased cognition, decreased coordination, decreased mobility, difficulty walking, decreased ROM, decreased strength, hypomobility, impaired flexibility, impaired tone, postural dysfunction, and pain.   ACTIVITY LIMITATIONS: carrying, lifting, bending, sitting, standing, squatting, sleeping, stairs, transfers, continence, toileting, reach over head, locomotion level, and caring for others  PARTICIPATION LIMITATIONS: meal prep, cleaning, laundry, personal finances, driving, shopping, community activity, occupation, yard work, and church  PERSONAL FACTORS: Age, Fitness, Time since onset of injury/illness/exacerbation, and 3+ comorbidities: CHF, COPD, rheumatoid arthritis  are also affecting patient's functional outcome.   REHAB POTENTIAL: Fair    CLINICAL DECISION MAKING: Evolving/moderate complexity  EVALUATION COMPLEXITY: Moderate   GOALS: Goals reviewed with patient? Yes  SHORT TERM GOALS: Target date: 06/01/2023    Pt will be independent with HEP.   Baseline: Goal status:  INITIAL  2.  Pt will be able to stand at least 10 mins without increased hip, knee and back pain Baseline:  Goal status: INITIAL  3.  Pt will report reducing fecal accidents to at most once/ week Baseline:  Goal status: INITIAL  4.  Pt will report reducing urinary leaking to max  once/ week Baseline:  Goal status: INITIAL   LONG TERM GOALS: Target date: 07/27/2023  Pt will be independent with advanced HEP.   Baseline:  Goal status: INITIAL  2.  Pt will be independent with use of squatty potty, relaxed toileting mechanics, and improved bowel movement techniques in order to increase ease of bowel movements and complete evacuation.   Baseline:  Goal status: INITIAL  3.  Pt will be able to functional actions such as transfer in bed from supine to sit  without leakage  Baseline:  Goal status: INITIAL  4.  Pt will have no fecal or urinary accidents/  week to be able to participate in community activities Baseline:  Goal status: INITIAL   PLAN:  PT FREQUENCY: 1-2x/week  PT DURATION: 12 weeks  PLANNED INTERVENTIONS: 97110-Therapeutic exercises, 97530- Therapeutic activity, 97112- Neuromuscular re-education, 97535- Self Care, 16109- Manual therapy, [phone]- Gait training, [phone]- Electrical stimulation (manual), Patient/Family education, Stair training, Taping, Dry Needling, Joint mobilization, Joint manipulation, Spinal manipulation, Spinal mobilization, Scar mobilization, Cryotherapy, Moist heat, and Biofeedback  PLAN FOR NEXT SESSION: internal and cont exercises   Grantley Savage, PT 05/12/2023, 9:39 AM

## 2023-05-13 ENCOUNTER — Encounter: Payer: Self-pay | Admitting: Podiatry

## 2023-05-13 ENCOUNTER — Ambulatory Visit (INDEPENDENT_AMBULATORY_CARE_PROVIDER_SITE_OTHER): Admitting: Podiatry

## 2023-05-13 DIAGNOSIS — L84 Corns and callosities: Secondary | ICD-10-CM | POA: Diagnosis not present

## 2023-05-13 NOTE — Progress Notes (Signed)
 Subjective: Taylor Berry is a 83 y.o. female patient who presents for follow-up of wound.   States doing better. She has rheumatoid arthritis Patient denies any new cramping, numbness, burning or tingling in the legs or feet. She has ABIs scheduled soon.  Patient Active Problem List   Diagnosis Date Noted   Impacted cerumen of right ear 01/17/2023   Dizziness 01/17/2023   Central perforation of tympanic membrane of right ear 01/17/2023   Other specified disorders of eustachian tube, right ear 01/17/2023   Hepatic lesion 12/20/2021   Chronic systolic heart failure (HCC)    Bradycardia 05/23/2021   Postural dizziness with presyncope 05/23/2021   Acute prerenal azotemia 05/23/2021   Cerebral thrombosis with cerebral infarction 01/02/2021   Encephalopathy acute/expressive aphasia  12/30/2020   Hyponatremia 12/30/2020   GERD (gastroesophageal reflux disease) 12/30/2020   Hypocalcemia 12/30/2020   Acute on chronic combined systolic and diastolic CHF (congestive heart failure) (HCC) 04/28/2019   non obstructive CAD 04/28/2019   Edema 04/14/2019   Abnormal CT of the chest 03/22/2018   Acid reflux 07/13/2017   Affective psychosis (HCC) 07/13/2017   Alcohol dependence in remission (HCC) 07/13/2017   BD (Bowen's disease) 07/13/2017   History of colon polyps 07/13/2017   Rheumatoid arthritis (HCC) 07/13/2017   Chronic bronchitis (HCC) 07/13/2017   Secondary pulmonary hypertension 07/13/2017   Obstructive apnea 07/13/2017   Age related osteoporosis 07/13/2017   Non-ischemic cardiomyopathy/diastolic and systolic CHF    Hyperhidrosis 16/10/9602   Anemia in neoplastic disease 05/02/2013   Allergic rhinitis 04/29/2013   Depression 04/29/2013   Lung cancer, left upper lobe 02/03/2013   Pulmonary infiltrates 01/05/2013   Mild pulmonary hypertension (HCC) 02/17/2011   OSA (obstructive sleep apnea) 02/17/2011   Dyspnea on exertion 11/22/2010   COPD (chronic obstructive pulmonary disease)  (HCC) 07/20/2009   Carcinoma in situ of cervix uteri 06/26/2009   Pure hypercholesterolemia 06/26/2009   OBESITY 06/26/2009   Chronic disease anemia 06/26/2009   Essential (primary) hypertension 06/26/2009   ARTHRITIS, RHEUMATOID 06/26/2009   OSTEOPOROSIS 06/26/2009   Cough 06/26/2009   Current Outpatient Medications on File Prior to Visit  Medication Sig Dispense Refill   albuterol  (VENTOLIN  HFA) 108 (90 Base) MCG/ACT inhaler Inhale 2 puffs into the lungs every 4 (four) hours as needed for wheezing or shortness of breath. 6.7 g 1   atorvastatin  (LIPITOR) 40 MG tablet Take 1 tablet (40 mg total) by mouth daily. 90 tablet 3   Calcium  Carb-Cholecalciferol (CALCIUM  + D3 PO) Take 1 tablet by mouth daily.     CALCIUM -VITAMIN D  PO Take 1 tablet by mouth daily.     candesartan  (ATACAND ) 8 MG tablet Take 1 tablet (8 mg total) by mouth daily. 90 tablet 2   clopidogrel  (PLAVIX ) 75 MG tablet Take 1 tablet (75 mg total) by mouth daily. 90 tablet 3   Cyanocobalamin  (VITAMIN B12) 1000 MCG TBCR Take 1 tablet by mouth daily.     fluticasone  (FLONASE ) 50 MCG/ACT nasal spray Place 1-2 sprays into both nostrils daily. 16 g 5   Fluticasone -Umeclidin-Vilant (TRELEGY ELLIPTA ) 100-62.5-25 MCG/ACT AEPB Inhale 1 puff into the lungs daily. 28 each 5   Fluticasone -Umeclidin-Vilant (TRELEGY ELLIPTA ) 100-62.5-25 MCG/ACT AEPB Inhale 200 mcg into the lungs daily. 2 each    leflunomide  (ARAVA ) 20 MG tablet Take 1 tablet (20 mg total) by mouth daily. 90 tablet 1   meclizine  (ANTIVERT ) 25 MG tablet Take 1 tablet (25 mg total) by mouth up to 3 (three) times daily as needed  for vertigo. 20 tablet 0   METAMUCIL FIBER PO Take by mouth See admin instructions. Mix 1 tablespoonful of powder into 4-8 ounces of desired beverage and drink once a day     predniSONE  (DELTASONE ) 20 MG tablet Take 40 mg by mouth daily.     predniSONE  (DELTASONE ) 5 MG tablet Take 3 tablets by mouth once a day for 7 days, then take 2 tablets once a day for  7 days, then take 1 tablet once a day for 14 days 49 tablet 3   No current facility-administered medications on file prior to visit.   Allergies  Allergen Reactions   Latex Itching and Rash   Metoprolol  Tartrate Other (See Comments) and Shortness Of Breath   Ace Inhibitors Cough   Angiotensin Receptor Blockers Swelling and Other (See Comments)    Tongue swelling   Fentanyl  Other (See Comments)    Hallucinations "I go crazy"      Guaifenesin  Er Diarrhea    Other Reaction(s): Not available   Hydrochlorothiazide Other (See Comments)    Decreased Sodium   Hydroxychloroquine Other (See Comments)    "Decreased sodium"   Penicillamine Nausea And Vomiting   Penicillins Other (See Comments)    Yeast infections Has patient had a PCN reaction causing immediate rash, facial/tongue/throat swelling, SOB or lightheadedness with hypotension:No Has patient had a PCN reaction causing severe rash involving mucus membranes or skin necrosis: No Has patient had a PCN reaction that required hospitalization No Has patient had a PCN reaction occurring within the last 10 years: Yes If all of the above answers are "NO", then may proceed with Cephalosporin use.  Other reaction(s): yeast infection     Objective: General: Patient is awake, alert, and oriented x 3 and in no acute distress.  Integument: Skin is warm, dry and supple bilateral. Nails are tender, long, thickened and  dystrophic with subungual debris, consistent with onychomycosis, 1-5 bilateral. No signs of infection. Lateral fifth metatarsal base noted ulceration healed. Remaining integument unremarkable.  Vasculature:  Dorsalis Pedis pulse 1/4 bilateral. Posterior Tibial pulse  1/4 bilateral.  Capillary fill time <3 sec 1-5 bilateral. Positive hair growth to the level of the digits. Temperature gradient within normal limits. No varicosities present bilateral. No edema present bilateral.   Neurology: The patient has intact sensation  measured with a 5.07/10g Semmes Weinstein Monofilament at all pedal sites bilateral . Vibratory sensation diminished bilateral with tuning fork. No Babinski sign present bilateral.   Musculoskeletal: No symptomatic pedal deformities noted bilateral. Muscular strength 5/5 in all lower extremity muscular groups bilateral without pain on range of motion . No tenderness with calf compression bilateral.  Summary:  Right: Resting right ankle-brachial index is within normal range. The  right toe-brachial index is abnormal.   Left: Resting left ankle-brachial index is within normal range. The left  toe-brachial index is abnormal.   Assessment and Plan:   ICD-10-CM   1. Pre-ulcerative calluses  L84            -Examined patient. Ulcer lateral plantar fifth metatarsal base right foot healed -Ucleration healed minimal debridement needed.  -Off-loading with donut pads  -Discussed glucose control and proper protein-rich diet.  -Discussed if any worsening redness, pain, fever or chills to call or may need to report to the emergency room. Patient expressed understanding.   Return in 4 weeks for recheck.   No follow-ups on file.    Jennefer Moats, DPM

## 2023-05-18 DIAGNOSIS — R531 Weakness: Secondary | ICD-10-CM | POA: Diagnosis not present

## 2023-05-18 DIAGNOSIS — Z79899 Other long term (current) drug therapy: Secondary | ICD-10-CM | POA: Diagnosis not present

## 2023-05-18 DIAGNOSIS — H43813 Vitreous degeneration, bilateral: Secondary | ICD-10-CM | POA: Diagnosis not present

## 2023-05-18 DIAGNOSIS — R682 Dry mouth, unspecified: Secondary | ICD-10-CM | POA: Diagnosis not present

## 2023-05-18 DIAGNOSIS — M1991 Primary osteoarthritis, unspecified site: Secondary | ICD-10-CM | POA: Diagnosis not present

## 2023-05-18 DIAGNOSIS — M06322 Rheumatoid nodule, left elbow: Secondary | ICD-10-CM | POA: Diagnosis not present

## 2023-05-18 DIAGNOSIS — M810 Age-related osteoporosis without current pathological fracture: Secondary | ICD-10-CM | POA: Diagnosis not present

## 2023-05-18 DIAGNOSIS — Z6826 Body mass index (BMI) 26.0-26.9, adult: Secondary | ICD-10-CM | POA: Diagnosis not present

## 2023-05-18 DIAGNOSIS — H35363 Drusen (degenerative) of macula, bilateral: Secondary | ICD-10-CM | POA: Diagnosis not present

## 2023-05-18 DIAGNOSIS — E663 Overweight: Secondary | ICD-10-CM | POA: Diagnosis not present

## 2023-05-18 DIAGNOSIS — M0589 Other rheumatoid arthritis with rheumatoid factor of multiple sites: Secondary | ICD-10-CM | POA: Diagnosis not present

## 2023-05-18 DIAGNOSIS — D72818 Other decreased white blood cell count: Secondary | ICD-10-CM | POA: Diagnosis not present

## 2023-05-19 ENCOUNTER — Ambulatory Visit: Admitting: Physical Therapy

## 2023-05-19 ENCOUNTER — Encounter: Payer: Self-pay | Admitting: Physical Therapy

## 2023-05-19 DIAGNOSIS — M6281 Muscle weakness (generalized): Secondary | ICD-10-CM | POA: Diagnosis not present

## 2023-05-19 DIAGNOSIS — R278 Other lack of coordination: Secondary | ICD-10-CM

## 2023-05-19 DIAGNOSIS — R2689 Other abnormalities of gait and mobility: Secondary | ICD-10-CM

## 2023-05-19 NOTE — Therapy (Addendum)
 OUTPATIENT PHYSICAL THERAPY FEMALE PELVIC TREATMENT   Patient Name: Taylor Berry MRN: [id number] DOB:1940-05-29, 83 y.o., female Today's Date: 05/19/2023  END OF SESSION:  PT End of Session - 05/19/23 1526     Visit Number 3    Authorization Type [employer]    Authorization Time Period 07/26/2013    PT Start Time 1445    PT Stop Time 1530    PT Time Calculation (min) 45 min    Activity Tolerance Patient tolerated treatment well    Behavior During Therapy [facility] for tasks assessed/performed               Past Medical History:  Diagnosis Date   Abnormal CT scan    Allergic rhinitis    Anemia 2015   after lung surgery   Angio-edema    Complication of anesthesia    Fentanyl  allergy, "claustrophobia"   COPD (chronic obstructive pulmonary disease) (HCC)    Depression    Diaphoresis 07/30/2015   Excessive sweating with little exertion   Difficult intravenous access    GERD (gastroesophageal reflux disease)    In the past   Hyperlipidemia    Hypertension    Left bundle branch block    Lung cancer (HCC) 2015   left upper lobe wedge removed-no further tx.   Neuropathy    Tingling in arms, fingers, and feet (Since 06/15/16)   Obesity    OSA (obstructive sleep apnea)    denies.   Osteoarthritis    ra also   Osteoporosis    Recurrent upper respiratory infection (URI)    Rheumatoid arthritis(714.0)    Dr. Ebbie Goldmann   SOB (shortness of breath) on exertion    Uterine cancer (HCC) 1971   tx surgical   Past Surgical History:  Procedure Laterality Date   ABDOMINAL HYSTERECTOMY     in situ carcinoma partial   CARDIAC CATHETERIZATION  01/27/11   minor non-obs CAD, NL EF, mild pulm HTN   CATARACT EXTRACTION, BILATERAL     last done 12'16-   COLONOSCOPY W/ BIOPSIES AND POLYPECTOMY     COLONOSCOPY WITH PROPOFOL  N/A 02/13/2015   Procedure: COLONOSCOPY WITH PROPOFOL ;  Surgeon: Jolinda Necessary, MD;  Location: [facility];  Service: Endoscopy;  Laterality: N/A;    ESOPHAGOGASTRODUODENOSCOPY (EGD) WITH PROPOFOL  N/A 09/30/2016   Procedure: ESOPHAGOGASTRODUODENOSCOPY (EGD) WITH PROPOFOL ;  Surgeon: Jolinda Necessary, MD;  Location: [facility];  Service: Endoscopy;  Laterality: N/A;   LYMPH NODE DISSECTION Left 04/12/2013   Procedure: LYMPH NODE DISSECTION;  Surgeon: Norita Beauvais, MD;  Location: [facility] OR;  Service: Thoracic;  Laterality: Left;   RIGHT/LEFT HEART CATH AND CORONARY ANGIOGRAPHY N/A 10/08/2016   Procedure: RIGHT/LEFT HEART CATH AND CORONARY ANGIOGRAPHY;  Surgeon: Odie Benne, MD;  Location: [facility];  Service: Cardiovascular;  Laterality: N/A;   TONSILLECTOMY     VESICOVAGINAL FISTULA CLOSURE W/ TAH  1971   VIDEO ASSISTED THORACOSCOPY (VATS)/THOROCOTOMY Left 04/12/2013   Procedure: VIDEO ASSISTED THORACOSCOPY (VATS)/THOROCOTOMY, WITH LEFT UPPER LOBE WEDGE RESECTION, CHEST WALL BIOPSY ;  Surgeon: Norita Beauvais, MD;  Location: [facility] OR;  Service: Thoracic;  Laterality: Left;   VIDEO BRONCHOSCOPY N/A 04/12/2013   Procedure: VIDEO BRONCHOSCOPY;  Surgeon: Norita Beauvais, MD;  Location: [facility] OR;  Service: Thoracic;  Laterality: N/A;   Patient Active Problem List   Diagnosis Date Noted   Impacted cerumen of right ear 01/17/2023   Dizziness 01/17/2023   Central perforation of tympanic membrane of right ear 01/17/2023  Other specified disorders of eustachian tube, right ear 01/17/2023   Hepatic lesion 12/20/2021   Chronic systolic heart failure (HCC)    Bradycardia 05/23/2021   Postural dizziness with presyncope 05/23/2021   Acute prerenal azotemia 05/23/2021   Cerebral thrombosis with cerebral infarction 01/02/2021   Encephalopathy acute/expressive aphasia  12/30/2020   Hyponatremia 12/30/2020   GERD (gastroesophageal reflux disease) 12/30/2020   Hypocalcemia 12/30/2020   Acute on chronic combined systolic and diastolic CHF (congestive heart failure) (HCC) 04/28/2019   non obstructive CAD 04/28/2019   Edema 04/14/2019    Abnormal CT of the chest 03/22/2018   Acid reflux 07/13/2017   Affective psychosis (HCC) 07/13/2017   Alcohol dependence in remission (HCC) 07/13/2017   BD (Bowen's disease) 07/13/2017   History of colon polyps 07/13/2017   Rheumatoid arthritis (HCC) 07/13/2017   Chronic bronchitis (HCC) 07/13/2017   Secondary pulmonary hypertension 07/13/2017   Obstructive apnea 07/13/2017   Age related osteoporosis 07/13/2017   Non-ischemic cardiomyopathy/diastolic and systolic CHF    Hyperhidrosis 11/91/4782   Anemia in neoplastic disease 05/02/2013   Allergic rhinitis 04/29/2013   Depression 04/29/2013   Lung cancer, left upper lobe 02/03/2013   Pulmonary infiltrates 01/05/2013   Mild pulmonary hypertension (HCC) 02/17/2011   OSA (obstructive sleep apnea) 02/17/2011   Dyspnea on exertion 11/22/2010   COPD (chronic obstructive pulmonary disease) (HCC) 07/20/2009   Carcinoma in situ of cervix uteri 06/26/2009   Pure hypercholesterolemia 06/26/2009   OBESITY 06/26/2009   Chronic disease anemia 06/26/2009   Essential (primary) hypertension 06/26/2009   ARTHRITIS, RHEUMATOID 06/26/2009   OSTEOPOROSIS 06/26/2009   Cough 06/26/2009    PCP: Benedetto Brady, MD  REFERRING PROVIDER: Albertina Hugger, MD   REFERRING DIAG: R15.9 (ICD-10-CM) - Incontinence of feces, unspecified fecal incontinence type   THERAPY DIAG:  Muscle weakness (generalized)  Other lack of coordination  Other abnormalities of gait and mobility  Rationale for Evaluation and Treatment: Rehabilitation  ONSET DATE: pt reports 5 years  SUBJECTIVE:                                                                                                                                                                                           SUBJECTIVE STATEMENT: Pt reports that that I did magic on her back last visit, back is feeling better.  Incontinence has been interesting- was doing a half a tablet every other day, now is doing  half a tablet every day. Has been doing the exercises and poop came out, did not make it to the bathroom.  Trying to figure out how much Immodium to take. It pissed her off that  she had the accident when she had the accident   Fluid intake: coffee, water  PAIN: no pain  PRECAUTIONS: None  RED FLAGS: None   WEIGHT BEARING RESTRICTIONS: No  FALLS:  Has patient fallen in last 6 months? No  OCCUPATION: retired  ACTIVITY LEVEL : not very active as far as exercise since her stroke, drives, goes to appts, goes to the grocery store, plays violin in 2 symphonies, is sure that she knows not being so active has something to do with this   PLOF: Independent with household mobility with device  PATIENT GOALS: to be well, to able to stand up and walk right and not have fecal incontinence, it is frustrating   PERTINENT HISTORY:  Hysterectomy, CHF, COPD Sexual abuse: No  BOWEL MOVEMENT: Sometimes it will happen when she walks around the grocery store, accidents are random, sometimes it just happens, sometimes when she eats something spicy, unpredictable Pain with bowel movement: No Type of bowel movement:Type (Bristol Stool Scale) 3 or 6, when it is 6 she might have an accident, he just changed her medication Fully empty rectum: No- sometimes she does not Leakage: Yes:   Pads: Yes: all the time Fiber supplement/laxative Yes Metamucil- 2 capsules/ day. Will start Immodium tonight  URINATION: has to get up in the night, when she lies down and sits up, she does not have the strength to hold it Pain with urination: No Fully empty bladder: Yes:   Stream: Strong Urgency: Yes  Frequency: when she is going from lying down to sitting up, she leaks, during the day, it's a little bit of leaks, her and there Leakage: Urge to void, Walking to the bathroom, Coughing, and Sneezing Pads: Yes: 2- 3 / day  INTERCOURSE: not active   PREGNANCY: Vaginal deliveries 2- breech and 1 premature, almost  died during the delivery Tearing No Episiotomy Yes  C-section deliveries 0 Currently pregnant No  PROLAPSE: None   OBJECTIVE:  Note: Objective measures were completed at Evaluation unless otherwise noted.   PATIENT SURVEYS:    PFIQ-7: 119  COGNITION: Overall cognitive status: Within functional limits for tasks assessed     SENSATION: Light touch: Appears intact  LUMBAR SPECIAL TESTS:  Single leg stance test: Positive- decreased balance and weakness  GAIT: Assistive device utilized: Single point cane Comments: slow, antalgic  POSTURE: rounded shoulders, forward head, decreased lumbar lordosis, and flexed trunk    LUMBARAROM/PROM: limited throughout  LOWER EXTREMITY ZOX:WRUEAVW throughout   LOWER EXTREMITY MMT: 4-/5 bilateral hips and knees  PALPATION:   General: difficulty with engaging abdomen and pelvic floor  Pelvic Alignment: even  Abdominal: restrictions throughout abdomen                External Perineal Exam: deferred to next visit                             Internal Pelvic Floor: deferred to next visit  Patient confirms identification and approves PT to assess internal pelvic floor and treatment No  PELVIC MMT:   MMT eval  Vaginal   Internal Anal Sphincter   External Anal Sphincter   Puborectalis   Diastasis Recti no  (Blank rows = not tested)        TONE: Deferred to next visit  PROLAPSE: Deferred to next visit  TODAY'S TREATMENT:  DATE: 05/19/23   EVAL see below   Neuro reed- ball press with pelvic floor lift, with transverse abdominis breath Hip adduction with ball Trial of RUSI for pt to see muscles  PATIENT EDUCATION:  Education details: HEP, expectations of PT, exam findings Person educated: Patient Education method: Explanation, Demonstration, Tactile cues, Verbal cues, and Handouts Education  comprehension: verbalized understanding, returned demonstration, verbal cues required, tactile cues required, and needs further education  HOME EXERCISE PROGRAM:  [id number]  ASSESSMENT:  CLINICAL IMPRESSION: Pt with difficulty with her exercises today, tried RUSI to visualize pelvic floor and abdominal wall with hip adduction with ball and transverse abdominis breath to see muscle activation however patient with a lot of fatty tissue and with poor picture. Pt distracted and frustrated today, easily ovewhelmed ( going to a funeral). She is increasing her dose of Immodium since she had a fecal accident last weak. Did not want internal, this is why we tried RUSI. Short of breath d/ t COPD with walking. Table exercises with fairly good endurance.She will benefit from cont PT.     OBJECTIVE IMPAIRMENTS: Abnormal gait, cardiopulmonary status limiting activity, decreased activity tolerance, decreased balance, decreased cognition, decreased coordination, decreased mobility, difficulty walking, decreased ROM, decreased strength, hypomobility, impaired flexibility, impaired tone, postural dysfunction, and pain.   ACTIVITY LIMITATIONS: carrying, lifting, bending, sitting, standing, squatting, sleeping, stairs, transfers, continence, toileting, reach over head, locomotion level, and caring for others  PARTICIPATION LIMITATIONS: meal prep, cleaning, laundry, personal finances, driving, shopping, community activity, occupation, yard work, and church  PERSONAL FACTORS: Age, Fitness, Time since onset of injury/illness/exacerbation, and 3+ comorbidities: CHF, COPD, rheumatoid arthritis are also affecting patient's functional outcome.   REHAB POTENTIAL: Fair    CLINICAL DECISION MAKING: Evolving/moderate complexity  EVALUATION COMPLEXITY: Moderate   GOALS: Goals reviewed with patient? Yes  SHORT TERM GOALS: Target date: 06/01/2023    Pt will be independent with HEP.   Baseline: Goal status:  INITIAL  2.  Pt will be able to stand at least 10 mins without increased hip, knee and back pain Baseline:  Goal status: INITIAL  3.  Pt will report reducing fecal accidents to at most once/ week Baseline:  Goal status: INITIAL  4.  Pt will report reducing urinary leaking to max  once/ week Baseline:  Goal status: INITIAL   LONG TERM GOALS: Target date: 07/27/2023  Pt will be independent with advanced HEP.   Baseline:  Goal status: INITIAL  2.  Pt will be independent with use of squatty potty, relaxed toileting mechanics, and improved bowel movement techniques in order to increase ease of bowel movements and complete evacuation.   Baseline:  Goal status: INITIAL  3.  Pt will be able to functional actions such as transfer in bed from supine to sit  without leakage  Baseline:  Goal status: INITIAL  4.  Pt will have no fecal or urinary accidents/ week to be able to participate in community activities Baseline:  Goal status: INITIAL   PLAN:  PT FREQUENCY: 1-2x/week  PT DURATION: 12 weeks  PLANNED INTERVENTIONS: 97110-Therapeutic exercises, 97530- Therapeutic activity, 97112- Neuromuscular re-education, 97535- Self Care, [id number]- Manual therapy, [phone]- Gait training, [phone]- Electrical stimulation (manual), Patient/Family education, Stair training, Taping, Dry Needling, Joint mobilization, Joint manipulation, Spinal manipulation, Spinal mobilization, Scar mobilization, Cryotherapy, Moist heat, and Biofeedback  PLAN FOR NEXT SESSION: internal and cont exercises   Salem Lembke, PT 05/19/2023, 3:29 PM

## 2023-05-25 ENCOUNTER — Telehealth: Payer: Self-pay | Admitting: Allergy and Immunology

## 2023-05-25 ENCOUNTER — Telehealth: Payer: Self-pay

## 2023-05-25 MED ORDER — TRELEGY ELLIPTA 100-62.5-25 MCG/ACT IN AEPB: INHALATION_SPRAY | RESPIRATORY_TRACT | Status: DC

## 2023-05-25 NOTE — Telephone Encounter (Signed)
 Copied from CRM [id number]. Topic: Appointments - Appointment Scheduling >> May 20, 2023 10:20 AM Juliaette Ober wrote: Patient/patient representative is calling to schedule an appointment. Refer to attachments for appointment information.  Patient would like to pick up 100 samples of trelegy  Spoke with the pt. She is unable to afford trelegy and states [facility] said she could have samples when needed.  2 samples are up front for pickup. Nothing further needed.

## 2023-05-25 NOTE — Telephone Encounter (Signed)
 Taylor Berry called and stated that Dr. Jerelene Monday told her she could have some samples of Trelogy 100. She would like someone to call her back and let her know if she can, or even if she can't she would still like someone to call her.

## 2023-05-26 NOTE — Telephone Encounter (Signed)
 Spoke with patient--DOB verified--informed her that Trelegy 100 would be available for pick up in [address]. Verbalized understanding.

## 2023-05-29 ENCOUNTER — Other Ambulatory Visit (HOSPITAL_COMMUNITY): Payer: Self-pay

## 2023-06-02 ENCOUNTER — Ambulatory Visit: Admitting: Physical Therapy

## 2023-06-02 ENCOUNTER — Encounter: Payer: Self-pay | Admitting: Physical Therapy

## 2023-06-02 DIAGNOSIS — M6281 Muscle weakness (generalized): Secondary | ICD-10-CM

## 2023-06-02 DIAGNOSIS — R278 Other lack of coordination: Secondary | ICD-10-CM

## 2023-06-02 DIAGNOSIS — R2689 Other abnormalities of gait and mobility: Secondary | ICD-10-CM

## 2023-06-02 NOTE — Therapy (Signed)
 OUTPATIENT PHYSICAL THERAPY FEMALE PELVIC TREATMENT   Patient Name: Taylor Berry MRN: [id number] DOB:09/30/1940, 83 y.o., female Today's Date: 06/02/2023  END OF SESSION:  PT End of Session - 06/02/23 1307     Visit Number 4    Authorization Type [employer]    Authorization Time Period 07/26/2013    PT Start Time 1235    PT Stop Time 1325    PT Time Calculation (min) 50 min    Activity Tolerance Patient tolerated treatment well    Behavior During Therapy WFL for tasks assessed/performed                Past Medical History:  Diagnosis Date   Abnormal CT scan    Allergic rhinitis    Anemia 2015   after lung surgery   Angio-edema    Complication of anesthesia    Fentanyl  allergy, "claustrophobia"   COPD (chronic obstructive pulmonary disease) (HCC)    Depression    Diaphoresis 07/30/2015   Excessive sweating with little exertion   Difficult intravenous access    GERD (gastroesophageal reflux disease)    In the past   Hyperlipidemia    Hypertension    Left bundle branch block    Lung cancer (HCC) 2015   left upper lobe wedge removed-no further tx.   Neuropathy    Tingling in arms, fingers, and feet (Since 06/15/16)   Obesity    OSA (obstructive sleep apnea)    denies.   Osteoarthritis    ra also   Osteoporosis    Recurrent upper respiratory infection (URI)    Rheumatoid arthritis(714.0)    Dr. Ebbie Goldmann   SOB (shortness of breath) on exertion    Uterine cancer (HCC) 1971   tx surgical   Past Surgical History:  Procedure Laterality Date   ABDOMINAL HYSTERECTOMY     in situ carcinoma partial   CARDIAC CATHETERIZATION  01/27/11   minor non-obs CAD, NL EF, mild pulm HTN   CATARACT EXTRACTION, BILATERAL     last done 12'16-   COLONOSCOPY W/ BIOPSIES AND POLYPECTOMY     COLONOSCOPY WITH PROPOFOL  N/A 02/13/2015   Procedure: COLONOSCOPY WITH PROPOFOL ;  Surgeon: Jolinda Necessary, MD;  Location: [facility];  Service: Endoscopy;  Laterality: N/A;    ESOPHAGOGASTRODUODENOSCOPY (EGD) WITH PROPOFOL  N/A 09/30/2016   Procedure: ESOPHAGOGASTRODUODENOSCOPY (EGD) WITH PROPOFOL ;  Surgeon: Jolinda Necessary, MD;  Location: [facility];  Service: Endoscopy;  Laterality: N/A;   LYMPH NODE DISSECTION Left 04/12/2013   Procedure: LYMPH NODE DISSECTION;  Surgeon: Norita Beauvais, MD;  Location: [facility] OR;  Service: Thoracic;  Laterality: Left;   RIGHT/LEFT HEART CATH AND CORONARY ANGIOGRAPHY N/A 10/08/2016   Procedure: RIGHT/LEFT HEART CATH AND CORONARY ANGIOGRAPHY;  Surgeon: Odie Benne, MD;  Location: [facility];  Service: Cardiovascular;  Laterality: N/A;   TONSILLECTOMY     VESICOVAGINAL FISTULA CLOSURE W/ TAH  1971   VIDEO ASSISTED THORACOSCOPY (VATS)/THOROCOTOMY Left 04/12/2013   Procedure: VIDEO ASSISTED THORACOSCOPY (VATS)/THOROCOTOMY, WITH LEFT UPPER LOBE WEDGE RESECTION, CHEST WALL BIOPSY ;  Surgeon: Norita Beauvais, MD;  Location: [facility] OR;  Service: Thoracic;  Laterality: Left;   VIDEO BRONCHOSCOPY N/A 04/12/2013   Procedure: VIDEO BRONCHOSCOPY;  Surgeon: Norita Beauvais, MD;  Location: [name] OR;  Service: Thoracic;  Laterality: N/A;   Patient Active Problem List   Diagnosis Date Noted   Impacted cerumen of right ear 01/17/2023   Dizziness 01/17/2023   Central perforation of tympanic membrane of right ear  01/17/2023   Other specified disorders of eustachian tube, right ear 01/17/2023   Hepatic lesion 12/20/2021   Chronic systolic heart failure (HCC)    Bradycardia 05/23/2021   Postural dizziness with presyncope 05/23/2021   Acute prerenal azotemia 05/23/2021   Cerebral thrombosis with cerebral infarction 01/02/2021   Encephalopathy acute/expressive aphasia  12/30/2020   Hyponatremia 12/30/2020   GERD (gastroesophageal reflux disease) 12/30/2020   Hypocalcemia 12/30/2020   Acute on chronic combined systolic and diastolic CHF (congestive heart failure) (HCC) 04/28/2019   non obstructive CAD 04/28/2019   Edema 04/14/2019    Abnormal CT of the chest 03/22/2018   Acid reflux 07/13/2017   Affective psychosis (HCC) 07/13/2017   Alcohol dependence in remission (HCC) 07/13/2017   BD (Bowen's disease) 07/13/2017   History of colon polyps 07/13/2017   Rheumatoid arthritis (HCC) 07/13/2017   Chronic bronchitis (HCC) 07/13/2017   Secondary pulmonary hypertension 07/13/2017   Obstructive apnea 07/13/2017   Age related osteoporosis 07/13/2017   Non-ischemic cardiomyopathy/diastolic and systolic CHF    Hyperhidrosis 40/98/1191   Anemia in neoplastic disease 05/02/2013   Allergic rhinitis 04/29/2013   Depression 04/29/2013   Lung cancer, left upper lobe 02/03/2013   Pulmonary infiltrates 01/05/2013   Mild pulmonary hypertension (HCC) 02/17/2011   OSA (obstructive sleep apnea) 02/17/2011   Dyspnea on exertion 11/22/2010   COPD (chronic obstructive pulmonary disease) (HCC) 07/20/2009   Carcinoma in situ of cervix uteri 06/26/2009   Pure hypercholesterolemia 06/26/2009   OBESITY 06/26/2009   Chronic disease anemia 06/26/2009   Essential (primary) hypertension 06/26/2009   ARTHRITIS, RHEUMATOID 06/26/2009   OSTEOPOROSIS 06/26/2009   Cough 06/26/2009    PCP: Benedetto Brady, MD  REFERRING PROVIDER: Albertina Hugger, MD   REFERRING DIAG: R15.9 (ICD-10-CM) - Incontinence of feces, unspecified fecal incontinence type   THERAPY DIAG:  Muscle weakness (generalized)  Other lack of coordination  Other abnormalities of gait and mobility  Rationale for Evaluation and Treatment: Rehabilitation  ONSET DATE: pt reports 5 years  SUBJECTIVE:                                                                                                                                                                                           SUBJECTIVE STATEMENT: Pt reports that she has not had any bowel incontinence so thats good.  Taking half a tablet every other day, every day is constipating.  Exercises are so so. Does exercises  sometimes, forgets Feels like she has the same activity level, runs out of energy.  She leaks when she gets out of bed.   Fluid intake: coffee, water  PAIN: no pain  PRECAUTIONS: None  RED FLAGS: None   WEIGHT BEARING RESTRICTIONS: No  FALLS:  Has patient fallen in last 6 months? No  OCCUPATION: retired  ACTIVITY LEVEL : not very active as far as exercise since her stroke, drives, goes to appts, goes to the grocery store, plays violin in 2 symphonies, is sure that she knows not being so active has something to do with this   PLOF: Independent with household mobility with device  PATIENT GOALS: to be well, to able to stand up and walk right and not have fecal incontinence, it is frustrating   PERTINENT HISTORY:  Hysterectomy, CHF, COPD Sexual abuse: No  BOWEL MOVEMENT: Sometimes it will happen when she walks around the grocery store, accidents are random, sometimes it just happens, sometimes when she eats something spicy, unpredictable Pain with bowel movement: No Type of bowel movement:Type (Bristol Stool Scale) 3 or 6, when it is 6 she might have an accident, he just changed her medication Fully empty rectum: No- sometimes she does not Leakage: Yes:   Pads: Yes: all the time Fiber supplement/laxative Yes Metamucil- 2 capsules/ day. Will start Immodium tonight  URINATION: has to get up in the night, when she lies down and sits up, she does not have the strength to hold it Pain with urination: No Fully empty bladder: Yes:   Stream: Strong Urgency: Yes  Frequency: when she is going from lying down to sitting up, she leaks, during the day, it's a little bit of leaks, her and there Leakage: Urge to void, Walking to the bathroom, Coughing, and Sneezing Pads: Yes: 2- 3 / day  INTERCOURSE: not active   PREGNANCY: Vaginal deliveries 2- breech and 1 premature, almost died during the delivery Tearing No Episiotomy Yes  C-section deliveries 0 Currently pregnant  No  PROLAPSE: None   OBJECTIVE:  Note: Objective measures were completed at Evaluation unless otherwise noted.   PATIENT SURVEYS:    PFIQ-7: 119  COGNITION: Overall cognitive status: Within functional limits for tasks assessed     SENSATION: Light touch: Appears intact  LUMBAR SPECIAL TESTS:  Single leg stance test: Positive- decreased balance and weakness  GAIT: Assistive device utilized: Single point cane Comments: slow, antalgic  POSTURE: rounded shoulders, forward head, decreased lumbar lordosis, and flexed trunk    LUMBARAROM/PROM: limited throughout  LOWER EXTREMITY WUJ:[id number] throughout   LOWER EXTREMITY MMT: 4-/5 bilateral hips and knees  PALPATION:   General: difficulty with engaging abdomen and pelvic floor  Pelvic Alignment: even  Abdominal: restrictions throughout abdomen                External Perineal Exam: deferred                              Internal Pelvic Floor: deferred   Patient confirms identification and approves PT to assess internal pelvic floor and treatment No  PELVIC MMT:   MMT eval  Vaginal   Internal Anal Sphincter   External Anal Sphincter   Puborectalis   Diastasis Recti no  (Blank rows = not tested)        TONE: Deferred   PROLAPSE: Deferred   TODAY'S TREATMENT:  DATE: 06/02/23   EVAL see below   Neuro reed- ball press with pelvic floor lift, with transverse abdominis breath Hip adduction with ball Trial of RUSI for pt to see pelvic floor  muscles contraction Sit to stand with transverse abdominis breath and pelvic floor contraction Supine to sit with transverse abdominis breath and  PF contraction PATIENT EDUCATION:  Education details: HEP, expectations of PT, exam findings Person educated: Patient Education method: Explanation, Demonstration, Tactile cues, Verbal cues, and  Handouts Education comprehension: verbalized understanding, returned demonstration, verbal cues required, tactile cues required, and needs further education  HOME EXERCISE PROGRAM:  [id number]  ASSESSMENT:  CLINICAL IMPRESSION: Pt with SOB today 02 sats 93 and 97% pre and post walk Pt did well with her exercises today with coordination of her core and breath with transfers. She was able to see it on RUSI. Progressing well. Has not had fecal incontinence but leaks at home with sit to stand. Will benefit from cont PT.      OBJECTIVE IMPAIRMENTS: Abnormal gait, cardiopulmonary status limiting activity, decreased activity tolerance, decreased balance, decreased cognition, decreased coordination, decreased mobility, difficulty walking, decreased ROM, decreased strength, hypomobility, impaired flexibility, impaired tone, postural dysfunction, and pain.   ACTIVITY LIMITATIONS: carrying, lifting, bending, sitting, standing, squatting, sleeping, stairs, transfers, continence, toileting, reach over head, locomotion level, and caring for others  PARTICIPATION LIMITATIONS: meal prep, cleaning, laundry, personal finances, driving, shopping, community activity, occupation, yard work, and church  PERSONAL FACTORS: Age, Fitness, Time since onset of injury/illness/exacerbation, and 3+ comorbidities: CHF, COPD, rheumatoid arthritis are also affecting patient's functional outcome.   REHAB POTENTIAL: Fair    CLINICAL DECISION MAKING: Evolving/moderate complexity  EVALUATION COMPLEXITY: Moderate   GOALS: Goals reviewed with patient? Yes  SHORT TERM GOALS: Target date: 06/01/2023    Pt will be independent with HEP.   Baseline: Goal status: not yet  2.  Pt will be able to stand at least 10 mins without increased hip, knee and back pain Baseline:  Goal status: INITIAL  3.  Pt will report reducing fecal accidents to at most once/ week Baseline:  Goal status: met  4.  Pt will report reducing  urinary leaking to max  once/ week Baseline:  Goal status: not yet   LONG TERM GOALS: Target date: 07/27/2023  Pt will be independent with advanced HEP.   Baseline:  Goal status: INITIAL  2.  Pt will be independent with use of squatty potty, relaxed toileting mechanics, and improved bowel movement techniques in order to increase ease of bowel movements and complete evacuation.   Baseline:  Goal status: INITIAL  3.  Pt will be able to functional actions such as transfer in bed from supine to sit  without leakage  Baseline:  Goal status: INITIAL  4.  Pt will have no fecal or urinary accidents/ week to be able to participate in community activities Baseline:  Goal status: INITIAL   PLAN:  PT FREQUENCY: 1-2x/week  PT DURATION: 12 weeks  PLANNED INTERVENTIONS: 97110-Therapeutic exercises, 97530- Therapeutic activity, 97112- Neuromuscular re-education, 97535- Self Care, 82956- Manual therapy, [phone]- Gait training, [phone]- Electrical stimulation (manual), Patient/Family education, Stair training, Taping, Dry Needling, Joint mobilization, Joint manipulation, Spinal manipulation, Spinal mobilization, Scar mobilization, Cryotherapy, Moist heat, and Biofeedback  PLAN FOR NEXT SESSION: internal and cont exercises   Andreah Goheen, PT 06/02/2023, 1:27 PM

## 2023-06-10 ENCOUNTER — Encounter: Payer: Self-pay | Admitting: Podiatry

## 2023-06-10 ENCOUNTER — Ambulatory Visit (INDEPENDENT_AMBULATORY_CARE_PROVIDER_SITE_OTHER): Admitting: Podiatry

## 2023-06-10 DIAGNOSIS — L84 Corns and callosities: Secondary | ICD-10-CM

## 2023-06-10 DIAGNOSIS — M79609 Pain in unspecified limb: Secondary | ICD-10-CM

## 2023-06-10 DIAGNOSIS — B351 Tinea unguium: Secondary | ICD-10-CM

## 2023-06-10 NOTE — Progress Notes (Signed)
 Subjective: Taylor Berry is a 83 y.o. female patient who presents for follow-up of wound.  Requesting to have nails trimmed today.  States doing better. She has rheumatoid arthritis Patient denies any new cramping, numbness, burning or tingling in the legs or feet. She has ABIs scheduled soon.  Patient Active Problem List   Diagnosis Date Noted   Impacted cerumen of right ear 01/17/2023   Dizziness 01/17/2023   Central perforation of tympanic membrane of right ear 01/17/2023   Other specified disorders of eustachian tube, right ear 01/17/2023   Hepatic lesion 12/20/2021   Chronic systolic heart failure (HCC)    Bradycardia 05/23/2021   Postural dizziness with presyncope 05/23/2021   Acute prerenal azotemia 05/23/2021   Cerebral thrombosis with cerebral infarction 01/02/2021   Encephalopathy acute/expressive aphasia  12/30/2020   Hyponatremia 12/30/2020   GERD (gastroesophageal reflux disease) 12/30/2020   Hypocalcemia 12/30/2020   Acute on chronic combined systolic and diastolic CHF (congestive heart failure) (HCC) 04/28/2019   non obstructive CAD 04/28/2019   Edema 04/14/2019   Abnormal CT of the chest 03/22/2018   Acid reflux 07/13/2017   Affective psychosis (HCC) 07/13/2017   Alcohol dependence in remission (HCC) 07/13/2017   BD (Bowen's disease) 07/13/2017   History of colon polyps 07/13/2017   Rheumatoid arthritis (HCC) 07/13/2017   Chronic bronchitis (HCC) 07/13/2017   Secondary pulmonary hypertension 07/13/2017   Obstructive apnea 07/13/2017   Age related osteoporosis 07/13/2017   Non-ischemic cardiomyopathy/diastolic and systolic CHF    Hyperhidrosis 16/10/9602   Anemia in neoplastic disease 05/02/2013   Allergic rhinitis 04/29/2013   Depression 04/29/2013   Lung cancer, left upper lobe 02/03/2013   Pulmonary infiltrates 01/05/2013   Mild pulmonary hypertension (HCC) 02/17/2011   OSA (obstructive sleep apnea) 02/17/2011   Dyspnea on exertion 11/22/2010   COPD  (chronic obstructive pulmonary disease) (HCC) 07/20/2009   Carcinoma in situ of cervix uteri 06/26/2009   Pure hypercholesterolemia 06/26/2009   OBESITY 06/26/2009   Chronic disease anemia 06/26/2009   Essential (primary) hypertension 06/26/2009   ARTHRITIS, RHEUMATOID 06/26/2009   OSTEOPOROSIS 06/26/2009   Cough 06/26/2009   Current Outpatient Medications on File Prior to Visit  Medication Sig Dispense Refill   albuterol  (VENTOLIN  HFA) 108 (90 Base) MCG/ACT inhaler Inhale 2 puffs into the lungs every 4 (four) hours as needed for wheezing or shortness of breath. 6.7 g 1   atorvastatin  (LIPITOR) 40 MG tablet Take 1 tablet (40 mg total) by mouth daily. 90 tablet 3   Calcium  Carb-Cholecalciferol (CALCIUM  + D3 PO) Take 1 tablet by mouth daily.     CALCIUM -VITAMIN D  PO Take 1 tablet by mouth daily.     candesartan  (ATACAND ) 8 MG tablet Take 1 tablet (8 mg total) by mouth daily. 90 tablet 2   clopidogrel  (PLAVIX ) 75 MG tablet Take 1 tablet (75 mg total) by mouth daily. 90 tablet 3   Cyanocobalamin  (VITAMIN B12) 1000 MCG TBCR Take 1 tablet by mouth daily.     fluticasone  (FLONASE ) 50 MCG/ACT nasal spray Place 1-2 sprays into both nostrils daily. 16 g 5   Fluticasone -Umeclidin-Vilant (TRELEGY ELLIPTA ) 100-62.5-25 MCG/ACT AEPB Inhale 1 puff into the lungs daily. 28 each 5   Fluticasone -Umeclidin-Vilant (TRELEGY ELLIPTA ) 100-62.5-25 MCG/ACT AEPB Inhale 200 mcg into the lungs daily. 2 each    Fluticasone -Umeclidin-Vilant (TRELEGY ELLIPTA ) 100-62.5-25 MCG/ACT AEPB 2 samples provided     leflunomide  (ARAVA ) 20 MG tablet Take 1 tablet (20 mg total) by mouth daily. 90 tablet 1   meclizine  (ANTIVERT )  25 MG tablet Take 1 tablet (25 mg total) by mouth up to 3 (three) times daily as needed for vertigo. 20 tablet 0   METAMUCIL FIBER PO Take by mouth See admin instructions. Mix 1 tablespoonful of powder into 4-8 ounces of desired beverage and drink once a day     predniSONE  (DELTASONE ) 20 MG tablet Take 40 mg  by mouth daily.     predniSONE  (DELTASONE ) 5 MG tablet Take 3 tablets by mouth once a day for 7 days, then take 2 tablets once a day for 7 days, then take 1 tablet once a day for 14 days 49 tablet 3   No current facility-administered medications on file prior to visit.   Allergies  Allergen Reactions   Latex Itching and Rash   Metoprolol  Tartrate Other (See Comments) and Shortness Of Breath   Ace Inhibitors Cough   Angiotensin Receptor Blockers Swelling and Other (See Comments)    Tongue swelling   Fentanyl  Other (See Comments)    Hallucinations "I go crazy"      Guaifenesin  Er Diarrhea    Other Reaction(s): Not available   Hydrochlorothiazide Other (See Comments)    Decreased Sodium   Hydroxychloroquine Other (See Comments)    "Decreased sodium"   Penicillamine Nausea And Vomiting   Penicillins Other (See Comments)    Yeast infections Has patient had a PCN reaction causing immediate rash, facial/tongue/throat swelling, SOB or lightheadedness with hypotension:No Has patient had a PCN reaction causing severe rash involving mucus membranes or skin necrosis: No Has patient had a PCN reaction that required hospitalization No Has patient had a PCN reaction occurring within the last 10 years: Yes If all of the above answers are "NO", then may proceed with Cephalosporin use.  Other reaction(s): yeast infection     Objective: General: Patient is awake, alert, and oriented x 3 and in no acute distress.  Integument: Skin is warm, dry and supple bilateral. Nails are tender, long, thickened and  dystrophic with subungual debris, consistent with onychomycosis, 1-5 bilateral. No signs of infection. Lateral fifth metatarsal base noted ulceration healed. Remaining integument unremarkable.  Vasculature:  Dorsalis Pedis pulse 1/4 bilateral. Posterior Tibial pulse  1/4 bilateral.  Capillary fill time <3 sec 1-5 bilateral. Positive hair growth to the level of the digits. Temperature gradient  within normal limits. No varicosities present bilateral. No edema present bilateral.   Neurology: The patient has intact sensation measured with a 5.07/10g Semmes Weinstein Monofilament at all pedal sites bilateral . Vibratory sensation diminished bilateral with tuning fork. No Babinski sign present bilateral.   Musculoskeletal: No symptomatic pedal deformities noted bilateral. Muscular strength 5/5 in all lower extremity muscular groups bilateral without pain on range of motion . No tenderness with calf compression bilateral.  Summary:  Right: Resting right ankle-brachial index is within normal range. The  right toe-brachial index is abnormal.   Left: Resting left ankle-brachial index is within normal range. The left  toe-brachial index is abnormal.   Assessment and Plan:   ICD-10-CM   1. Skin ulcer of plantar aspect of right foot, limited to breakdown of skin (HCC)  L97.511            -Examined patient. Ulcer lateral plantar fifth metatarsal base right foot healed -Ucleration healed minimal debridement needed.  -Off-loading with donut pads  -Mechanically debrided all nails 1-5 bilateral using sterile nail nipper and filed with dremel without incident  -Answered all patient questions -Patient to return  in 3 months for at  risk foot care -Patient advised to call the office if any problems or questions arise in the meantime.   Return in 4 weeks for recheck.   No follow-ups on file.    Jennefer Moats, DPM

## 2023-06-12 ENCOUNTER — Other Ambulatory Visit (HOSPITAL_COMMUNITY): Payer: Self-pay

## 2023-06-14 NOTE — Progress Notes (Signed)
 HEMATOLOGY/ONCOLOGY CONSULTATION NOTE  Date of Service: 06/15/2023  Patient Care Team: Benedetto Brady, MD as PCP - General (Family Medicine) Audery Blazing Deannie Fabian, MD as PCP - Cardiology (Cardiology) Denson Flake, MD as Consulting Physician (Pulmonary Disease) Alanson Alliance, MD as Consulting Physician (Rheumatology)  CHIEF COMPLAINTS/PURPOSE OF CONSULTATION:  Evaluation and management of leukopenia  HISTORY OF PRESENTING ILLNESS:   Taylor Berry is a wonderful 83 y.o. female who has been referred to us  by Benedetto Brady, MD for evaluation and management of leukopenia.  She was noted to have been previously seen by me on 08/19/2016 for evaluation and management of excessive diaphoresis.  Labs done with PCP on 4/29 showed WBC count of 3.1k with lymphopenia of 600 with normal ANC of 2000.  Patient notes no acute infections. No fevers/chills/night sweats or weight loss.  Notes no other acute new focal infections. Paitent does have a hx of RA and is on Leflunomide . She has also been receiving Prednisone  on and off fof flares.   MEDICAL HISTORY:  Past Medical History:  Diagnosis Date   Abnormal CT scan    Allergic rhinitis    Anemia 2015   after lung surgery   Angio-edema    Complication of anesthesia    Fentanyl  allergy, claustrophobia   COPD (chronic obstructive pulmonary disease) (HCC)    Depression    Diaphoresis 07/30/2015   Excessive sweating with little exertion   Difficult intravenous access    GERD (gastroesophageal reflux disease)    In the past   Hyperlipidemia    Hypertension    Left bundle branch block    Lung cancer (HCC) 2015   left upper lobe wedge removed-no further tx.   Neuropathy    Tingling in arms, fingers, and feet (Since 06/15/16)   Obesity    OSA (obstructive sleep apnea)    denies.   Osteoarthritis    ra also   Osteoporosis    Recurrent upper respiratory infection (URI)    Rheumatoid arthritis(714.0)    Dr. Ebbie Goldmann   SOB (shortness  of breath) on exertion    Uterine cancer (HCC) 1971   tx surgical    SURGICAL HISTORY: Past Surgical History:  Procedure Laterality Date   ABDOMINAL HYSTERECTOMY     in situ carcinoma partial   CARDIAC CATHETERIZATION  01/27/11   minor non-obs CAD, NL EF, mild pulm HTN   CATARACT EXTRACTION, BILATERAL     last done 12'16-   COLONOSCOPY W/ BIOPSIES AND POLYPECTOMY     COLONOSCOPY WITH PROPOFOL  N/A 02/13/2015   Procedure: COLONOSCOPY WITH PROPOFOL ;  Surgeon: Jolinda Necessary, MD;  Location: [facility];  Service: Endoscopy;  Laterality: N/A;   ESOPHAGOGASTRODUODENOSCOPY (EGD) WITH PROPOFOL  N/A 09/30/2016   Procedure: ESOPHAGOGASTRODUODENOSCOPY (EGD) WITH PROPOFOL ;  Surgeon: Jolinda Necessary, MD;  Location: [facility];  Service: Endoscopy;  Laterality: N/A;   LYMPH NODE DISSECTION Left 04/12/2013   Procedure: LYMPH NODE DISSECTION;  Surgeon: Norita Beauvais, MD;  Location: [facility] OR;  Service: Thoracic;  Laterality: Left;   RIGHT/LEFT HEART CATH AND CORONARY ANGIOGRAPHY N/A 10/08/2016   Procedure: RIGHT/LEFT HEART CATH AND CORONARY ANGIOGRAPHY;  Surgeon: Odie Benne, MD;  Location: [facility];  Service: Cardiovascular;  Laterality: N/A;   TONSILLECTOMY     VESICOVAGINAL FISTULA CLOSURE W/ TAH  1971   VIDEO ASSISTED THORACOSCOPY (VATS)/THOROCOTOMY Left 04/12/2013   Procedure: VIDEO ASSISTED THORACOSCOPY (VATS)/THOROCOTOMY, WITH LEFT UPPER LOBE WEDGE RESECTION, CHEST WALL BIOPSY ;  Surgeon: Norita Beauvais, MD;  Location: [facility] OR;  Service: Thoracic;  Laterality: Left;   VIDEO BRONCHOSCOPY N/A 04/12/2013   Procedure: VIDEO BRONCHOSCOPY;  Surgeon: Norita Beauvais, MD;  Location: [facility] OR;  Service: Thoracic;  Laterality: N/A;    SOCIAL HISTORY: Social History   Socioeconomic History   Marital status: Widowed    Spouse name: Not on file   Number of children: 2   Years of education: College   Highest education level: Master's degree (e.g., MA, MS, MEng, MEd, MSW, MBA)   Occupational History   Occupation: Glass blower/designer: [employer]  Tobacco Use   Smoking status: Former    Current packs/day: 0.00    Average packs/day: 1 pack/day for 35.0 years (35.0 ttl pk-yrs)    Types: Cigarettes    Start date: 01/06/1953    Quit date: 01/07/1988    Years since quitting: 35.4   Smokeless tobacco: Never  Vaping Use   Vaping status: Never Used  Substance and Sexual Activity   Alcohol use: No    Comment: not since 1988   Drug use: No   Sexual activity: Not Currently  Other Topics Concern   Not on file  Social History Narrative   Not on file   Social Drivers of Health   Financial Resource Strain: Low Risk  (12/06/2019)   Overall Financial Resource Strain (CARDIA)    Difficulty of Paying Living Expenses: Not hard at all  Food Insecurity: No Food Insecurity (12/06/2019)   Hunger Vital Sign    Worried About Running Out of Food in the Last Year: Never true    Ran Out of Food in the Last Year: Never true  Transportation Needs: No Transportation Needs (12/06/2019)   PRAPARE - Administrator, Civil Service (Medical): No    Lack of Transportation (Non-Medical): No  Physical Activity: Inactive (12/06/2019)   Exercise Vital Sign    Days of Exercise per Week: 0 days    Minutes of Exercise per Session: 0 min  Stress: No Stress Concern Present (12/06/2019)   [employer] of Occupational Health - Occupational Stress Questionnaire    Feeling of Stress : Not at all  Social Connections: Moderately Integrated (12/06/2019)   Social Connection and Isolation Panel    Frequency of Communication with Friends and Family: More than three times a week    Frequency of Social Gatherings with Friends and Family: More than three times a week    Attends Religious Services: More than 4 times per year    Active Member of [employer] or Organizations: Yes    Attends Banker Meetings: 1 to 4 times per year    Marital Status: Widowed  Intimate Partner Violence: Not  At Risk (12/06/2019)   Humiliation, Afraid, Rape, and Kick questionnaire    Fear of Current or Ex-Partner: No    Emotionally Abused: No    Physically Abused: No    Sexually Abused: No    FAMILY HISTORY: Family History  Problem Relation Age of Onset   Lung cancer Mother    Coronary artery disease Son    Heart attack Son 32   Arthritis/Rheumatoid Maternal Grandmother    Coronary artery disease Son    Arthritis/Rheumatoid Son     ALLERGIES:  is allergic to latex, metoprolol  tartrate, ace inhibitors, angiotensin receptor blockers, fentanyl , guaifenesin  er, hydrochlorothiazide, hydroxychloroquine, penicillamine, and penicillins.  MEDICATIONS:  Current Outpatient Medications  Medication Sig Dispense Refill   albuterol  (VENTOLIN  HFA) 108 (90 Base) MCG/ACT inhaler Inhale 2 puffs  into the lungs every 4 (four) hours as needed for wheezing or shortness of breath. 6.7 g 1   atorvastatin  (LIPITOR) 40 MG tablet Take 1 tablet (40 mg total) by mouth daily. 90 tablet 3   Calcium  Carb-Cholecalciferol (CALCIUM  + D3 PO) Take 1 tablet by mouth daily.     CALCIUM -VITAMIN D  PO Take 1 tablet by mouth daily.     candesartan  (ATACAND ) 8 MG tablet Take 1 tablet (8 mg total) by mouth daily. 90 tablet 2   clopidogrel  (PLAVIX ) 75 MG tablet Take 1 tablet (75 mg total) by mouth daily. 90 tablet 3   Cyanocobalamin  (VITAMIN B12) 1000 MCG TBCR Take 1 tablet by mouth daily.     fluticasone  (FLONASE ) 50 MCG/ACT nasal spray Place 1-2 sprays into both nostrils daily. 16 g 5   Fluticasone -Umeclidin-Vilant (TRELEGY ELLIPTA ) 100-62.5-25 MCG/ACT AEPB Inhale 1 puff into the lungs daily. 28 each 5   Fluticasone -Umeclidin-Vilant (TRELEGY ELLIPTA ) 100-62.5-25 MCG/ACT AEPB Inhale 200 mcg into the lungs daily. 2 each    Fluticasone -Umeclidin-Vilant (TRELEGY ELLIPTA ) 100-62.5-25 MCG/ACT AEPB 2 samples provided     leflunomide  (ARAVA ) 20 MG tablet Take 1 tablet (20 mg total) by mouth daily. 90 tablet 1   meclizine  (ANTIVERT ) 25 MG  tablet Take 1 tablet (25 mg total) by mouth up to 3 (three) times daily as needed for vertigo. 20 tablet 0   METAMUCIL FIBER PO Take by mouth See admin instructions. Mix 1 tablespoonful of powder into 4-8 ounces of desired beverage and drink once a day     Multiple Vitamins-Minerals (PRESERVISION AREDS) CAPS Take by mouth.     predniSONE  (DELTASONE ) 20 MG tablet Take 40 mg by mouth daily.     predniSONE  (DELTASONE ) 5 MG tablet Take 3 tablets by mouth once a day for 7 days, then take 2 tablets once a day for 7 days, then take 1 tablet once a day for 14 days 49 tablet 3   No current facility-administered medications for this visit.    REVIEW OF SYSTEMS:    10 Point review of Systems was done is negative except as noted above.  PHYSICAL EXAMINATION: ECOG PERFORMANCE STATUS: 2 - Symptomatic, <50% confined to bed  . Vitals:   06/15/23 1435  BP: (!) 141/75  Pulse: 94  Resp: 18  Temp: (!) 97.5 F (36.4 C)  SpO2: 94%   Filed Weights   06/15/23 1435  Weight: 155 lb (70.3 kg)   .Body mass index is 28.35 kg/m.  GENERAL:alert, in no acute distress and comfortable SKIN: no acute rashes, no significant lesions EYES: conjunctiva are pink and non-injected, sclera anicteric OROPHARYNX: MMM, no exudates, no oropharyngeal erythema or ulceration NECK: supple, no JVD LYMPH:  no palpable lymphadenopathy in the cervical, axillary or inguinal regions LUNGS: clear to auscultation b/l with normal respiratory effort HEART: regular rate & rhythm ABDOMEN:  normoactive bowel sounds , non tender, not distended. Extremity: no pedal edema PSYCH: alert & oriented x 3 with fluent speech NEURO: no focal motor/sensory deficits  LABORATORY DATA:  I have reviewed the data as listed  .    Latest Ref Rng & Units 06/15/2023    3:51 PM 06/10/2021   10:45 AM 05/26/2021    2:55 AM  CBC  WBC 4.0 - 10.5 K/uL 4.7  6.7  4.8   Hemoglobin 12.0 - 15.0 g/dL 9.6  98.1  9.5   Hematocrit 36.0 - 46.0 % 29.8  33.4  29.7    Platelets 150 - 400 K/uL 246  252  202    05/06/2023 CBC with Differential:   CBC with differential 11/05/2022:   11/01/2021 CBC with differential:        Latest Ref Rng & Units 06/15/2023    3:51 PM 05/26/2021    2:55 AM 05/25/2021    1:51 AM  CMP  Glucose 70 - 99 mg/dL 562  95  130   BUN 8 - 23 mg/dL 25  17  20    Creatinine 0.44 - 1.00 mg/dL 8.65  7.84  6.96   Sodium 135 - 145 mmol/L 135  137  138   Potassium 3.5 - 5.1 mmol/L 4.3  4.0  4.0   Chloride 98 - 111 mmol/L 102  110  110   CO2 22 - 32 mmol/L 27  22  23    Calcium  8.9 - 10.3 mg/dL 9.3  9.0  8.9   Total Protein 6.5 - 8.1 g/dL 6.8     Total Bilirubin 0.0 - 1.2 mg/dL 0.4     Alkaline Phos 38 - 126 U/L 87     AST 15 - 41 U/L 29     ALT 0 - 44 U/L 10      05/05/2023 CMP:     11/05/2022 CMP:   11/01/2021 CMP:     05/05/2023 Vitamin D :  11/05/2022 Vitamin D :      RADIOGRAPHIC STUDIES: I have personally reviewed the radiological images as listed and agreed with the findings in the report. No results found.  ASSESSMENT & PLAN:  83 y.o. female h/o RA presenting with lymphopenia:  Leukopenia/Lymphopenia in the context of RA on leflunomide  and use of steroids.  PLAN:  -her WBCs were 3.1K in April 2025 with ALC of 600, and noted to previously be 5K in October 2024 and 6.9K in October 2023 -discussed possible etiologies of lymphopenia in her clinical context of RA. Can be autoimmune. Prednisone  and Leflunomide  are also known to cause lymphopenia and are the likely causes. -will evaluate to r/o other less likely etiologies of her Leukopenia. -lab work ordered as noted below.  . Orders Placed This Encounter  Procedures   CBC with Differential (Cancer Center Only)    Standing Status:   Future    Number of Occurrences:   1    Expected Date:   06/15/2023    Expiration Date:   06/14/2024   CMP (Cancer Center only)    Standing Status:   Future    Number of Occurrences:   1    Expected Date:   06/15/2023     Expiration Date:   06/14/2024   Lactate dehydrogenase    Standing Status:   Future    Number of Occurrences:   1    Expected Date:   06/15/2023    Expiration Date:   06/14/2024   Flow Cytometry, Peripheral Blood (Oncology)    Patient with RA on immunosupressive with atypical lymphocytes --plz evaluate for clonal Lymphoproliferative process.    Standing Status:   Future    Number of Occurrences:   1    Expected Date:   06/15/2023    Expiration Date:   06/14/2024   HIV Antibody (routine testing w rflx)    Standing Status:   Future    Number of Occurrences:   1    Expected Date:   06/15/2023    Expiration Date:   06/14/2024   Vitamin B12    Standing Status:   Future    Number of Occurrences:   1  Expected Date:   06/15/2023    Expiration Date:   06/14/2024   Multiple Myeloma Panel (SPEP&IFE w/QIG)    Standing Status:   Future    Number of Occurrences:   1    Expected Date:   06/15/2023    Expiration Date:   06/14/2024   Kappa/lambda light chains    Standing Status:   Future    Number of Occurrences:   1    Expected Date:   06/15/2023    Expiration Date:   06/14/2024   Zinc     Standing Status:   Future    Number of Occurrences:   1    Expected Date:   06/15/2023    Expiration Date:   06/14/2024    FOLLOW-UP:  Labs today Phone visit with Dr Salomon Cree in 2 weeks   The total time spent in the appointment was 45 minutes* .  All of the patient's questions were answered with apparent satisfaction. The patient knows to call the clinic with any problems, questions or concerns.   Jacquelyn Matt MD MS AAHIVMS [facility] Hematology/Oncology Physician [facility]  .*Total Encounter Time as defined by the Centers for Medicare and Medicaid Services includes, in addition to the face-to-face time of a patient visit (documented in the note above) non-face-to-face time: obtaining and reviewing outside history, ordering and reviewing medications, tests or procedures, care coordination (communications with other  health care professionals or caregivers) and documentation in the medical record.    I,Mitra Faeizi,acting as a Neurosurgeon for Jacquelyn Matt, MD.,have documented all relevant documentation on the behalf of Jacquelyn Matt, MD,as directed by  Jacquelyn Matt, MD while in the presence of Jacquelyn Matt, MD.  .I have reviewed the above documentation for accuracy and completeness, and I agree with the above. .Bralee Feldt Kishore Terreon Ekholm MD

## 2023-06-15 ENCOUNTER — Other Ambulatory Visit: Payer: Self-pay

## 2023-06-15 ENCOUNTER — Inpatient Hospital Stay: Attending: Hematology | Admitting: Hematology

## 2023-06-15 ENCOUNTER — Inpatient Hospital Stay

## 2023-06-15 VITALS — BP 141/75 | HR 94 | Temp 97.5°F | Resp 18 | Wt 155.0 lb

## 2023-06-15 DIAGNOSIS — D72810 Lymphocytopenia: Secondary | ICD-10-CM | POA: Insufficient documentation

## 2023-06-15 DIAGNOSIS — Z87891 Personal history of nicotine dependence: Secondary | ICD-10-CM | POA: Insufficient documentation

## 2023-06-15 LAB — CMP (CANCER CENTER ONLY)
ALT: 10 U/L (ref 0–44)
AST: 29 U/L (ref 15–41)
Albumin: 3.7 g/dL (ref 3.5–5.0)
Alkaline Phosphatase: 87 U/L (ref 38–126)
Anion gap: 6 (ref 5–15)
BUN: 25 mg/dL — ABNORMAL HIGH (ref 8–23)
CO2: 27 mmol/L (ref 22–32)
Calcium: 9.3 mg/dL (ref 8.9–10.3)
Chloride: 102 mmol/L (ref 98–111)
Creatinine: 0.77 mg/dL (ref 0.44–1.00)
GFR, Estimated: >60 mL/min (ref >60)
Glucose, Bld: 105 mg/dL — ABNORMAL HIGH (ref 70–99)
Potassium: 4.3 mmol/L (ref 3.5–5.1)
Sodium: 135 mmol/L (ref 135–145)
Total Bilirubin: 0.4 mg/dL (ref 0.0–1.2)
Total Protein: 6.8 g/dL (ref 6.5–8.1)

## 2023-06-15 LAB — CBC WITH DIFFERENTIAL (CANCER CENTER ONLY)
Abs Immature Granulocytes: 0.02 10*3/uL (ref 0.00–0.07)
Basophils Absolute: 0 10*3/uL (ref 0.0–0.1)
Basophils Relative: 1 %
Eosinophils Absolute: 0.1 10*3/uL (ref 0.0–0.5)
Eosinophils Relative: 2 %
HCT: 29.8 % — ABNORMAL LOW (ref 36.0–46.0)
Hemoglobin: 9.6 g/dL — ABNORMAL LOW (ref 12.0–15.0)
Immature Granulocytes: 0 %
Lymphocytes Relative: 17 %
Lymphs Abs: 0.8 10*3/uL (ref 0.7–4.0)
MCH: 29.6 pg (ref 26.0–34.0)
MCHC: 32.2 g/dL (ref 30.0–36.0)
MCV: 92 fL (ref 80.0–100.0)
Monocytes Absolute: 0.7 10*3/uL (ref 0.1–1.0)
Monocytes Relative: 14 %
Neutro Abs: 3.1 10*3/uL (ref 1.7–7.7)
Neutrophils Relative %: 66 %
Platelet Count: 246 10*3/uL (ref 150–400)
RBC: 3.24 MIL/uL — ABNORMAL LOW (ref 3.87–5.11)
RDW: 14.7 % (ref 11.5–15.5)
WBC Count: 4.7 10*3/uL (ref 4.0–10.5)
nRBC: 0 % (ref 0.0–0.2)

## 2023-06-15 LAB — LACTATE DEHYDROGENASE: LDH: 157 U/L (ref 98–192)

## 2023-06-15 LAB — VITAMIN B12: Vitamin B-12: 953 pg/mL — ABNORMAL HIGH (ref 180–914)

## 2023-06-15 LAB — HIV ANTIBODY (ROUTINE TESTING W REFLEX): HIV Screen 4th Generation wRfx: NONREACTIVE

## 2023-06-16 ENCOUNTER — Encounter: Payer: Self-pay | Admitting: Physical Therapy

## 2023-06-16 ENCOUNTER — Ambulatory Visit: Attending: Gastroenterology | Admitting: Physical Therapy

## 2023-06-16 DIAGNOSIS — M6281 Muscle weakness (generalized): Secondary | ICD-10-CM | POA: Diagnosis not present

## 2023-06-16 DIAGNOSIS — R278 Other lack of coordination: Secondary | ICD-10-CM

## 2023-06-16 DIAGNOSIS — R2689 Other abnormalities of gait and mobility: Secondary | ICD-10-CM | POA: Diagnosis not present

## 2023-06-16 LAB — KAPPA/LAMBDA LIGHT CHAINS
Kappa free light chain: 34.7 mg/L — ABNORMAL HIGH (ref 3.3–19.4)
Kappa, lambda light chain ratio: 1.36 (ref 0.26–1.65)
Lambda free light chains: 25.6 mg/L (ref 5.7–26.3)

## 2023-06-16 NOTE — Therapy (Signed)
 OUTPATIENT PHYSICAL THERAPY FEMALE PELVIC TREATMENT   Patient Name: Taylor Berry MRN: [id number] DOB:09-10-1940, 83 y.o., female Today's Date: 06/16/2023  END OF SESSION:  PT End of Session - 06/16/23 1152     Visit Number 5    Authorization Type [employer]    Authorization Time Period 07/26/2013    PT Start Time 1150    PT Stop Time 1230    PT Time Calculation (min) 40 min    Activity Tolerance Patient tolerated treatment well    Behavior During Therapy WFL for tasks assessed/performed                 Past Medical History:  Diagnosis Date   Abnormal CT scan    Allergic rhinitis    Anemia 2015   after lung surgery   Angio-edema    Complication of anesthesia    Fentanyl  allergy, "claustrophobia"   COPD (chronic obstructive pulmonary disease) (HCC)    Depression    Diaphoresis 07/30/2015   Excessive sweating with little exertion   Difficult intravenous access    GERD (gastroesophageal reflux disease)    In the past   Hyperlipidemia    Hypertension    Left bundle branch block    Lung cancer (HCC) 2015   left upper lobe wedge removed-no further tx.   Neuropathy    Tingling in arms, fingers, and feet (Since 06/15/16)   Obesity    OSA (obstructive sleep apnea)    denies.   Osteoarthritis    ra also   Osteoporosis    Recurrent upper respiratory infection (URI)    Rheumatoid arthritis(714.0)    Dr. Ebbie Goldmann   SOB (shortness of breath) on exertion    Uterine cancer (HCC) 1971   tx surgical   Past Surgical History:  Procedure Laterality Date   ABDOMINAL HYSTERECTOMY     in situ carcinoma partial   CARDIAC CATHETERIZATION  01/27/11   minor non-obs CAD, NL EF, mild pulm HTN   CATARACT EXTRACTION, BILATERAL     last done 12'16-   COLONOSCOPY W/ BIOPSIES AND POLYPECTOMY     COLONOSCOPY WITH PROPOFOL  N/A 02/13/2015   Procedure: COLONOSCOPY WITH PROPOFOL ;  Surgeon: Jolinda Necessary, MD;  Location: [facility];  Service: Endoscopy;  Laterality: N/A;    ESOPHAGOGASTRODUODENOSCOPY (EGD) WITH PROPOFOL  N/A 09/30/2016   Procedure: ESOPHAGOGASTRODUODENOSCOPY (EGD) WITH PROPOFOL ;  Surgeon: Jolinda Necessary, MD;  Location: [facility];  Service: Endoscopy;  Laterality: N/A;   LYMPH NODE DISSECTION Left 04/12/2013   Procedure: LYMPH NODE DISSECTION;  Surgeon: Norita Beauvais, MD;  Location: [facility] OR;  Service: Thoracic;  Laterality: Left;   RIGHT/LEFT HEART CATH AND CORONARY ANGIOGRAPHY N/A 10/08/2016   Procedure: RIGHT/LEFT HEART CATH AND CORONARY ANGIOGRAPHY;  Surgeon: Odie Benne, MD;  [facility];  Service: Cardiovascular;  Laterality: N/A;   TONSILLECTOMY     VESICOVAGINAL FISTULA CLOSURE W/ TAH  1971   VIDEO ASSISTED THORACOSCOPY (VATS)/THOROCOTOMY Left 04/12/2013   Procedure: VIDEO ASSISTED THORACOSCOPY (VATS)/THOROCOTOMY, WITH LEFT UPPER LOBE WEDGE RESECTION, CHEST WALL BIOPSY ;  Surgeon: Norita Beauvais, MD;  Location: [facility] OR;  Service: Thoracic;  Laterality: Left;   VIDEO BRONCHOSCOPY N/A 04/12/2013   Procedure: VIDEO BRONCHOSCOPY;  Surgeon: Norita Beauvais, MD;  Location: [facility] OR;  Service: Thoracic;  Laterality: N/A;   Patient Active Problem List   Diagnosis Date Noted   Impacted cerumen of right ear 01/17/2023   Dizziness 01/17/2023   Central perforation of tympanic membrane of right  ear 01/17/2023   Other specified disorders of eustachian tube, right ear 01/17/2023   Hepatic lesion 12/20/2021   Chronic systolic heart failure (HCC)    Bradycardia 05/23/2021   Postural dizziness with presyncope 05/23/2021   Acute prerenal azotemia 05/23/2021   Cerebral thrombosis with cerebral infarction 01/02/2021   Encephalopathy acute/expressive aphasia  12/30/2020   Hyponatremia 12/30/2020   GERD (gastroesophageal reflux disease) 12/30/2020   Hypocalcemia 12/30/2020   Acute on chronic combined systolic and diastolic CHF (congestive heart failure) (HCC) 04/28/2019   non obstructive CAD 04/28/2019   Edema 04/14/2019    Abnormal CT of the chest 03/22/2018   Acid reflux 07/13/2017   Affective psychosis (HCC) 07/13/2017   Alcohol dependence in remission (HCC) 07/13/2017   BD (Bowen's disease) 07/13/2017   History of colon polyps 07/13/2017   Rheumatoid arthritis (HCC) 07/13/2017   Chronic bronchitis (HCC) 07/13/2017   Secondary pulmonary hypertension 07/13/2017   Obstructive apnea 07/13/2017   Age related osteoporosis 07/13/2017   Non-ischemic cardiomyopathy/diastolic and systolic CHF    Hyperhidrosis 81/19/1478   Anemia in neoplastic disease 05/02/2013   Allergic rhinitis 04/29/2013   Depression 04/29/2013   Lung cancer, left upper lobe 02/03/2013   Pulmonary infiltrates 01/05/2013   Mild pulmonary hypertension (HCC) 02/17/2011   OSA (obstructive sleep apnea) 02/17/2011   Dyspnea on exertion 11/22/2010   COPD (chronic obstructive pulmonary disease) (HCC) 07/20/2009   Carcinoma in situ of cervix uteri 06/26/2009   Pure hypercholesterolemia 06/26/2009   OBESITY 06/26/2009   Chronic disease anemia 06/26/2009   Essential (primary) hypertension 06/26/2009   ARTHRITIS, RHEUMATOID 06/26/2009   OSTEOPOROSIS 06/26/2009   Cough 06/26/2009    PCP: Benedetto Brady, MD  REFERRING PROVIDER: Albertina Hugger, MD   REFERRING DIAG: R15.9 (ICD-10-CM) - Incontinence of feces, unspecified fecal incontinence type   THERAPY DIAG:  Muscle weakness (generalized)  Other abnormalities of gait and mobility  Other lack of coordination  Rationale for Evaluation and Treatment: Rehabilitation  ONSET DATE: pt reports 5 years  SUBJECTIVE:                                                                                                                                                                                           SUBJECTIVE STATEMENT: Pt reports that she had terrible pain after last session in her low abdomen, it took a while to go away- about 10 days. She thought that something ripped. She saw a  hematologist recently for low red blood counts- they took 12 vials of blood and she feels tired today and  is concerned about what heath issue she might have. Waiting to hear.  Pt reports that incontinence is still there but she can pretty much make it to the bathroom, sometimes she can't. Feels improvement.  She had a craving for chips and salsa the other day and she had bowel incontinence - gave her diarrhea.   Fluid intake: coffee, water  PAIN: no pain  PRECAUTIONS: None  RED FLAGS: None   WEIGHT BEARING RESTRICTIONS: No  FALLS:  Has patient fallen in last 6 months? No  OCCUPATION: retired  ACTIVITY LEVEL : not very active as far as exercise since her stroke, drives, goes to appts, goes to the grocery store, plays violin in 2 symphonies, is sure that she knows not being so active has something to do with this   PLOF: Independent with household mobility with device  PATIENT GOALS: to be well, to able to stand up and walk right and not have fecal incontinence, it is frustrating   PERTINENT HISTORY:  Hysterectomy, CHF, COPD Sexual abuse: No  BOWEL MOVEMENT: Sometimes it will happen when she walks around the grocery store, accidents are random, sometimes it just happens, sometimes when she eats something spicy, unpredictable Pain with bowel movement: No Type of bowel movement:Type (Bristol Stool Scale) 3 or 6, when it is 6 she might have an accident, he just changed her medication Fully empty rectum: No- sometimes she does not Leakage: Yes:   Pads: Yes: all the time Fiber supplement/laxative Yes Metamucil- 2 capsules/ day. Will start Immodium tonight  URINATION: has to get up in the night, when she lies down and sits up, she does not have the strength to hold it Pain with urination: No Fully empty bladder: Yes:   Stream: Strong Urgency: Yes  Frequency: when she is going from lying down to sitting up, she leaks, during the day, it's a little bit of leaks, her and  there Leakage: Urge to void, Walking to the bathroom, Coughing, and Sneezing Pads: Yes: 2- 3 / day  INTERCOURSE: not active   PREGNANCY: Vaginal deliveries 2- breech and 1 premature, almost died during the delivery Tearing No Episiotomy Yes  C-section deliveries 0 Currently pregnant No  PROLAPSE: None   OBJECTIVE:  Note: Objective measures were completed at Evaluation unless otherwise noted.   PATIENT SURVEYS:    PFIQ-7: 119  COGNITION: Overall cognitive status: Within functional limits for tasks assessed     SENSATION: Light touch: Appears intact  LUMBAR SPECIAL TESTS:  Single leg stance test: Positive- decreased balance and weakness  GAIT: Assistive device utilized: Single point cane Comments: slow, antalgic  POSTURE: rounded shoulders, forward head, decreased lumbar lordosis, and flexed trunk    LUMBARAROM/PROM: limited throughout  LOWER EXTREMITY ZOX:WRUEAVW throughout   LOWER EXTREMITY MMT: 4-/5 bilateral hips and knees  PALPATION:   General: difficulty with engaging abdomen and pelvic floor  Pelvic Alignment: even  Abdominal: restrictions throughout abdomen                External Perineal Exam: deferred                              Internal Pelvic Floor: deferred   Patient confirms identification and approves PT to assess internal pelvic floor and treatment No  PELVIC MMT:   MMT eval  Vaginal   Internal Anal Sphincter   External Anal Sphincter   Puborectalis   Diastasis Recti no  (Blank rows = not tested)        TONE: Deferred  PROLAPSE: Deferred   TODAY'S TREATMENT:                                                                                                                              DATE: 06/16/2023    Neuro reed- ball press with pelvic floor lift, with transverse abdominis breath Hip adduction with ball  Sit to stand with transverse abdominis breath and pelvic floor contraction with red theraband holding Nustep 5:45  mins  Arm bike 2+2 mins Supine to sit with transverse abdominis breath and  PF contraction   PATIENT EDUCATION:  Education details: HEP, expectations of PT, exam findings Person educated: Patient Education method: Explanation, Demonstration, Tactile cues, Verbal cues, and Handouts Education comprehension: verbalized understanding, returned demonstration, verbal cues required, tactile cues required, and needs further education  HOME EXERCISE PROGRAM:  [id number]  ASSESSMENT:  CLINICAL IMPRESSION: Pt with SOB today 02 sats 97% pre and during exercise.  Pt did well with her exercises today with coordination of her core and breath with transfers. Tried nu step and arm bike. Progressing slowly but well. Decreased energy today. Has had fecal incontinence episode and has had reduced urine leaks at home with sit to stand. She is trying to do her exercises but has been sore and with decreased energy. Pt has weakness overall. Will benefit from cont PT.      OBJECTIVE IMPAIRMENTS: Abnormal gait, cardiopulmonary status limiting activity, decreased activity tolerance, decreased balance, decreased cognition, decreased coordination, decreased mobility, difficulty walking, decreased ROM, decreased strength, hypomobility, impaired flexibility, impaired tone, postural dysfunction, and pain.   ACTIVITY LIMITATIONS: carrying, lifting, bending, sitting, standing, squatting, sleeping, stairs, transfers, continence, toileting, reach over head, locomotion level, and caring for others  PARTICIPATION LIMITATIONS: meal prep, cleaning, laundry, personal finances, driving, shopping, community activity, occupation, yard work, and church  PERSONAL FACTORS: Age, Fitness, Time since onset of injury/illness/exacerbation, and 3+ comorbidities: CHF, COPD, rheumatoid arthritis are also affecting patient's functional outcome.   REHAB POTENTIAL: Fair    CLINICAL DECISION MAKING: Evolving/moderate complexity  EVALUATION  COMPLEXITY: Moderate   GOALS: Goals reviewed with patient? Yes  SHORT TERM GOALS: Target date: 06/01/2023    Pt will be independent with HEP.   Baseline: Goal status: not yet  2.  Pt will be able to stand at least 10 mins without increased hip, knee and back pain Baseline:  Goal status: INITIAL  3.  Pt will report reducing fecal accidents to at most once/ week Baseline:  Goal status: met  4.  Pt will report reducing urinary leaking to max  once/ week Baseline:  Goal status: not yet   LONG TERM GOALS: Target date: 07/27/2023  Pt will be independent with advanced HEP.   Baseline:  Goal status: INITIAL  2.  Pt will be independent with use of squatty potty, relaxed toileting mechanics, and improved bowel movement techniques in order to increase ease of bowel movements and complete evacuation.   Baseline:  Goal status:  INITIAL  3.  Pt will be able to functional actions such as transfer in bed from supine to sit  without leakage  Baseline:  Goal status: INITIAL  4.  Pt will have no fecal or urinary accidents/ week to be able to participate in community activities Baseline:  Goal status: INITIAL   PLAN:  PT FREQUENCY: 1-2x/week  PT DURATION: 12 weeks  PLANNED INTERVENTIONS: 97110-Therapeutic exercises, 97530- Therapeutic activity, 97112- Neuromuscular re-education, 97535- Self Care, 40102- Manual therapy, [phone]- Gait training, [phone]- Electrical stimulation (manual), Patient/Family education, Stair training, Taping, Dry Needling, Joint mobilization, Joint manipulation, Spinal manipulation, Spinal mobilization, Scar mobilization, Cryotherapy, Moist heat, and Biofeedback  PLAN FOR NEXT SESSION: internal and cont exercises   Reo Portela, PT 06/16/2023, 12:24 PM

## 2023-06-17 LAB — SURGICAL PATHOLOGY

## 2023-06-17 LAB — ZINC: Zinc: 93 ug/dL (ref 44–115)

## 2023-06-18 LAB — MULTIPLE MYELOMA PANEL, SERUM
Albumin SerPl Elph-Mcnc: 3.1 g/dL (ref 2.9–4.4)
Albumin/Glob SerPl: 1 (ref 0.7–1.7)
Alpha 1: 0.3 g/dL (ref 0.0–0.4)
Alpha2 Glob SerPl Elph-Mcnc: 1 g/dL (ref 0.4–1.0)
B-Globulin SerPl Elph-Mcnc: 1 g/dL (ref 0.7–1.3)
Gamma Glob SerPl Elph-Mcnc: 1 g/dL (ref 0.4–1.8)
Globulin, Total: 3.4 g/dL (ref 2.2–3.9)
IgA: 219 mg/dL (ref 64–422)
IgG (Immunoglobin G), Serum: 1111 mg/dL (ref 586–1602)
IgM (Immunoglobulin M), Srm: 96 mg/dL (ref 26–217)
M Protein SerPl Elph-Mcnc: 0.5 g/dL — ABNORMAL HIGH
Total Protein ELP: 6.5 g/dL (ref 6.0–8.5)

## 2023-06-18 LAB — FLOW CYTOMETRY

## 2023-06-22 ENCOUNTER — Ambulatory Visit: Admitting: Gastroenterology

## 2023-06-22 ENCOUNTER — Encounter: Payer: Self-pay | Admitting: Gastroenterology

## 2023-06-22 VITALS — BP 100/60 | HR 65 | Ht 62.0 in | Wt 150.0 lb

## 2023-06-22 DIAGNOSIS — R932 Abnormal findings on diagnostic imaging of liver and biliary tract: Secondary | ICD-10-CM

## 2023-06-22 DIAGNOSIS — R159 Full incontinence of feces: Secondary | ICD-10-CM | POA: Diagnosis not present

## 2023-06-22 NOTE — Progress Notes (Signed)
 [facility] Progress Note  Chief Complaint: Fecal incontinence  Subjective  Prior history From 05/04/2023 clinic visit:  Taylor Berry is a 83 y.o. female, previous patient of Dr. Savannah Curlin, with past medical history of HTN, CHF, secondary pulmonary HTN, CAD, h/o cerebral thrombosis, COPD, OSA, h/o lung and cervical cancer, anemia, GERD, colon polyps, who presents today for a complaint of diarrhea and fecal incontinence .     Patient last seen in office with Dr. Savannah Curlin 03/2021 for complaint of diarrhea and fecal incontinence. She has had workup for this in the past at [facility] as well (Dr. Alvin Axe. Honey Lusty). She was referred by them to [facility] after declining colonoscopy but did not keep that appointment. Patient declined to have any further colonoscopy because she feels she is too high risk.    At last visit, plan was to increase Metamucil to BID dosing and trial cholestyramine  4g BID. Stool tests ordered including GI pathogen panel, fecal calprotectin, and fecal elastase, all of which were normal.   She has history of liver lesion identified on CT and confirmed on MRI. By MRI, most likely hemangioma versus FNH. There were no associated symptoms at the time. Given the potential increase in size compared to 2018, repeat imaging was recommended in 6 months from last visit, which patient declined at the time due to cost.    Today, patient states her fecal incontinence is unpredictable. She has a bowel movement typically once a day which is often loose, but not watery. She has fecal incontinence on average 4x/week, usually when she is out walking, running errands, etc. She also reports urinary incontinence. She does not have a full bowel movement with the incontinence and is usually able to get to a bathroom to empty her bowels, but says it is more than just a little bit of leakage.   She takes 2 Metamucil tablets in the morning and 2 colestipol  tablets (1mg  each) at night. This does help  somewhat, but she is still having troublesome incontinence. She tried the cholestyramine  powder and did not notice much improvement, and her copay for the powder was too expensive, so she switched back to colestipol .   She again reiterates that she does not want to have a colonoscopy at her age due to procedure risk.   She has not been evaluated by pelvic floor physical therapy.   (10 minutes late for visit)  - At that visit, discontinued colestipol  and recommended Imodium, continue fiber use and referral for pelvic PT - MRI follow-up liver lesion recommended, patient had declined due to cost   Discussed the use of AI scribe software for clinical note transcription with the patient, who gave verbal consent to proceed.  History of Present Illness  Taylor Berry was glad to report that with just 1/2 tablet of Imodium every other day her stools are more formed with fewer episodes of incontinence.  Appetite is good and weight stable, no rectal bleeding. She is also attending pelvic floor PT and has found this helpful, though on 1 occasion she says she feels she may have overdone the exercises and had some abdominal or pelvic pain for several days. She has been concerned about recent lab work from hematology appointment, she saw a number of abnormal results and is anxiously awaiting a scheduled telephone result visit at the end of this month.  (Over 10 minutes late for visit)  Recently saw hematology for leukopenia in the context of RA on leflunomide  and steroids and had  additional testing (Dr Salomon Cree) ROS: Cardiovascular:  no chest pain Respiratory: no dyspnea  The patient's Past Medical, Family and Social History were reviewed and are on file in the EMR. Past Medical History:  Diagnosis Date   Abnormal CT scan    Allergic rhinitis    Anemia 2015   after lung surgery   Angio-edema    Complication of anesthesia    Fentanyl  allergy, claustrophobia   COPD (chronic obstructive pulmonary disease)  (HCC)    Depression    Diaphoresis 07/30/2015   Excessive sweating with little exertion   Difficult intravenous access    GERD (gastroesophageal reflux disease)    In the past   Hyperlipidemia    Hypertension    Left bundle branch block    Lung cancer (HCC) 2015   left upper lobe wedge removed-no further tx.   Neuropathy    Tingling in arms, fingers, and feet (Since 06/15/16)   Obesity    OSA (obstructive sleep apnea)    denies.   Osteoarthritis    ra also   Osteoporosis    Recurrent upper respiratory infection (URI)    Rheumatoid arthritis(714.0)    Dr. Ebbie Goldmann   SOB (shortness of breath) on exertion    Uterine cancer (HCC) 1971   tx surgical    Past Surgical History:  Procedure Laterality Date   ABDOMINAL HYSTERECTOMY     in situ carcinoma partial   CARDIAC CATHETERIZATION  01/27/11   minor non-obs CAD, NL EF, mild pulm HTN   CATARACT EXTRACTION, BILATERAL     last done 12'16-   COLONOSCOPY W/ BIOPSIES AND POLYPECTOMY     COLONOSCOPY WITH PROPOFOL  N/A 02/13/2015   Procedure: COLONOSCOPY WITH PROPOFOL ;  Surgeon: Jolinda Necessary, MD;  [facility];  Service: Endoscopy;  Laterality: N/A;   ESOPHAGOGASTRODUODENOSCOPY (EGD) WITH PROPOFOL  N/A 09/30/2016   Procedure: ESOPHAGOGASTRODUODENOSCOPY (EGD) WITH PROPOFOL ;  Surgeon: Jolinda Necessary, MD;  [facility];  Service: Endoscopy;  Laterality: N/A;   LYMPH NODE DISSECTION Left 04/12/2013   Procedure: LYMPH NODE DISSECTION;  Surgeon: Norita Beauvais, MD;  Location: [facility] OR;  Service: Thoracic;  Laterality: Left;   RIGHT/LEFT HEART CATH AND CORONARY ANGIOGRAPHY N/A 10/08/2016   Procedure: RIGHT/LEFT HEART CATH AND CORONARY ANGIOGRAPHY;  Surgeon: Odie Benne, MD;  [facility];  Service: Cardiovascular;  Laterality: N/A;   TONSILLECTOMY     VESICOVAGINAL FISTULA CLOSURE W/ TAH  1971   VIDEO ASSISTED THORACOSCOPY (VATS)/THOROCOTOMY Left 04/12/2013   Procedure: VIDEO ASSISTED THORACOSCOPY  (VATS)/THOROCOTOMY, WITH LEFT UPPER LOBE WEDGE RESECTION, CHEST WALL BIOPSY ;  Surgeon: Norita Beauvais, MD;  Location: [facility];  Service: Thoracic;  Laterality: Left;   VIDEO BRONCHOSCOPY N/A 04/12/2013   Procedure: VIDEO BRONCHOSCOPY;  Surgeon: Norita Beauvais, MD;  Location: [facility] OR;  Service: Thoracic;  Laterality: N/A;     Objective:  Med list reviewed  Current Outpatient Medications:    albuterol  (VENTOLIN  HFA) 108 (90 Base) MCG/ACT inhaler, Inhale 2 puffs into the lungs every 4 (four) hours as needed for wheezing or shortness of breath., Disp: 6.7 g, Rfl: 1   atorvastatin  (LIPITOR) 40 MG tablet, Take 1 tablet (40 mg total) by mouth daily., Disp: 90 tablet, Rfl: 3   Calcium  Carb-Cholecalciferol (CALCIUM  + D3 PO), Take 1 tablet by mouth daily., Disp: , Rfl:    CALCIUM -VITAMIN D  PO, Take 1 tablet by mouth daily., Disp: , Rfl:    candesartan  (ATACAND ) 8 MG tablet, Take 1 tablet (8 mg  total) by mouth daily., Disp: 90 tablet, Rfl: 2   clopidogrel  (PLAVIX ) 75 MG tablet, Take 1 tablet (75 mg total) by mouth daily., Disp: 90 tablet, Rfl: 3   Cyanocobalamin  (VITAMIN B12) 1000 MCG TBCR, Take 1 tablet by mouth daily., Disp: , Rfl:    fluticasone  (FLONASE ) 50 MCG/ACT nasal spray, Place 1-2 sprays into both nostrils daily., Disp: 16 g, Rfl: 5   Fluticasone -Umeclidin-Vilant (TRELEGY ELLIPTA ) 100-62.5-25 MCG/ACT AEPB, Inhale 1 puff into the lungs daily., Disp: 28 each, Rfl: 5   Fluticasone -Umeclidin-Vilant (TRELEGY ELLIPTA ) 100-62.5-25 MCG/ACT AEPB, Inhale 200 mcg into the lungs daily., Disp: 2 each, Rfl:    Fluticasone -Umeclidin-Vilant (TRELEGY ELLIPTA ) 100-62.5-25 MCG/ACT AEPB, 2 samples provided, Disp: , Rfl:    leflunomide  (ARAVA ) 20 MG tablet, Take 1 tablet (20 mg total) by mouth daily., Disp: 90 tablet, Rfl: 1   meclizine  (ANTIVERT ) 25 MG tablet, Take 1 tablet (25 mg total) by mouth up to 3 (three) times daily as needed for vertigo., Disp: 20 tablet, Rfl: 0   METAMUCIL FIBER PO, Take by mouth See  admin instructions. Mix 1 tablespoonful of powder into 4-8 ounces of desired beverage and drink once a day, Disp: , Rfl:    Multiple Vitamins-Minerals (PRESERVISION AREDS) CAPS, Take by mouth., Disp: , Rfl:    Vital signs in last 24 hrs: Vitals:   06/22/23 1042  BP: 100/60  Pulse: 65   Wt Readings from Last 3 Encounters:  06/22/23 150 lb (68 kg)  06/15/23 155 lb (70.3 kg)  05/04/23 149 lb (67.6 kg)    Physical Exam Well-appearing No additional exam-entire visit spent in review of symptoms results and plan   Labs:     Latest Ref Rng & Units 06/15/2023    3:51 PM 06/10/2021   10:45 AM 05/26/2021    2:55 AM  CBC  WBC 4.0 - 10.5 K/uL 4.7  6.7  4.8   Hemoglobin 12.0 - 15.0 g/dL 9.6  16.1  9.5   Hematocrit 36.0 - 46.0 % 29.8  33.4  29.7   Platelets 150 - 400 K/uL 246  252  202   RBC indices normal     Latest Ref Rng & Units 06/15/2023    3:51 PM 05/26/2021    2:55 AM 05/25/2021    1:51 AM  CMP  Glucose 70 - 99 mg/dL 096  95  045   BUN 8 - 23 mg/dL 25  17  20    Creatinine 0.44 - 1.00 mg/dL 4.09  8.11  9.14   Sodium 135 - 145 mmol/L 135  137  138   Potassium 3.5 - 5.1 mmol/L 4.3  4.0  4.0   Chloride 98 - 111 mmol/L 102  110  110   CO2 22 - 32 mmol/L 27  22  23    Calcium  8.9 - 10.3 mg/dL 9.3  9.0  8.9   Total Protein 6.5 - 8.1 g/dL 6.8     Total Bilirubin 0.0 - 1.2 mg/dL 0.4     Alkaline Phos 38 - 126 U/L 87     AST 15 - 41 U/L 29     ALT 0 - 44 U/L 10        SPEP done - Kappa free light chain increased ___________________________________________ Radiologic studies:   ____________________________________________ Other:   _____________________________________________   Encounter Diagnoses  Name Primary?   Incontinence of feces, unspecified fecal incontinence type Yes   Abnormal finding on imaging of liver     Assessment & Plan  She has fecal incontinence from reduced into rectal sphincter tone, much improved lately on low-dose Imodium.  She was glad we were  able to stop the bile acid binding agent since it was not working well and was cumbersome to take. As noted above, previous findings of hemangioma or FNH.  I recommended a surveillance MRI and check on that, but she is feeling overwhelmed by uncertainty regarding her hematologic evaluation and what that means as well as known costs of MRI and other tests.  Therefore she wished to put that off until some other issues are settled. She was encouraged to contact us  either by phone or portal message for further advice if needed regarding her incontinence of bowel habits or when she feels ready to do the MRI of the liver.   22 minutes were spent on this encounter (including chart review, history/exam, counseling/coordination of care, and documentation) > 50% of that time was spent on counseling and coordination of care.   Kerby Pearson III

## 2023-06-22 NOTE — Patient Instructions (Signed)
 _______________________________________________________  If your blood pressure at your visit was 140/90 or greater, please contact your primary care physician to follow up on this.  _______________________________________________________  If you are age 83 or older, your body mass index should be between 23-30. Your Body mass index is 27.44 kg/m. If this is out of the aforementioned range listed, please consider follow up with your Primary Care Provider.  If you are age 60 or younger, your body mass index should be between 19-25. Your Body mass index is 27.44 kg/m. If this is out of the aformentioned range listed, please consider follow up with your Primary Care Provider.   ________________________________________________________  The [facility] providers would like to encourage you to use MYCHART to communicate with providers for non-urgent requests or questions.  Due to long hold times on the telephone, sending your provider a message by [facility] may be a faster and more efficient way to get a response.  Please allow 48 business hours for a response.  Please remember that this is for non-urgent requests.  _______________________________________________________   Thank you for trusting me with your gastrointestinal care!    Dr. Kaleen O[facility]

## 2023-06-23 ENCOUNTER — Other Ambulatory Visit (HOSPITAL_COMMUNITY): Payer: Self-pay

## 2023-06-23 ENCOUNTER — Other Ambulatory Visit: Payer: Self-pay

## 2023-06-23 ENCOUNTER — Encounter: Payer: Self-pay | Admitting: Allergy and Immunology

## 2023-06-23 ENCOUNTER — Ambulatory Visit (INDEPENDENT_AMBULATORY_CARE_PROVIDER_SITE_OTHER): Payer: PPO | Admitting: Allergy and Immunology

## 2023-06-23 ENCOUNTER — Encounter: Payer: Self-pay | Admitting: Pharmacist

## 2023-06-23 VITALS — BP 114/66 | HR 68 | Temp 98.4°F | Ht 62.0 in | Wt 149.6 lb

## 2023-06-23 DIAGNOSIS — D472 Monoclonal gammopathy: Secondary | ICD-10-CM

## 2023-06-23 DIAGNOSIS — J3089 Other allergic rhinitis: Secondary | ICD-10-CM | POA: Diagnosis not present

## 2023-06-23 DIAGNOSIS — K219 Gastro-esophageal reflux disease without esophagitis: Secondary | ICD-10-CM

## 2023-06-23 DIAGNOSIS — J4489 Other specified chronic obstructive pulmonary disease: Secondary | ICD-10-CM

## 2023-06-23 MED ORDER — ALBUTEROL SULFATE HFA 108 (90 BASE) MCG/ACT IN AERS
2.0000 | INHALATION_SPRAY | RESPIRATORY_TRACT | 1 refills | Status: DC | PRN
  Filled 2023-06-23: qty 6.7, 17d supply, fill #0

## 2023-06-23 MED ORDER — FLUTICASONE PROPIONATE 50 MCG/ACT NA SUSP
2.0000 | Freq: Every morning | NASAL | 1 refills | Status: DC
  Filled 2023-06-23: qty 48, 90d supply, fill #0

## 2023-06-23 MED ORDER — TRELEGY ELLIPTA 100-62.5-25 MCG/ACT IN AEPB
1.0000 | INHALATION_SPRAY | Freq: Every day | RESPIRATORY_TRACT | 1 refills | Status: AC
Start: 2023-06-23 — End: ?
  Filled 2023-06-23: qty 180, 90d supply, fill #0

## 2023-06-23 NOTE — Patient Instructions (Addendum)
  1.  Continue to treat inflammation of airway:  A. Flonase  -1-2 sprays each nostril 1 time per day B. Trelegy 100 - 1 inhalation 1 time per day.    3.  If needed:   A. Albuterol  - 2 inhalations every 4-6 hours.  4. Return to clinic in 6 months or earlier if problem  5. MGUS  6. OTC combination influenza / Covid swab

## 2023-06-23 NOTE — Progress Notes (Signed)
 [place] - [place] - [place] - [place] - [place]   Follow-up Note  Referring Provider: Benedetto Brady, MD Primary Provider: Benedetto Brady, MD Date of Office Visit: 06/23/2023  Subjective:   Taylor Berry (DOB: 03-20-1940) is a 83 y.o. female who [facility] in re-evaluation of the following:  HPI: Nattaly returns to this clinic in evaluation of COPD/asthma overlap, allergic rhinitis, LPR.  I last saw her in this clinic 16 December 2022.  Overall she has done really very well regarding her respiratory tract while consistently using a triple inhaler and she rarely uses the short acting bronchodilator and she can exert herself to the extent that her musculoskeletal issues allow her to do so with no problem.  And she believes that her nose is doing very well with rare use of Flonase .  She has not required a systemic steroid or an antibiotic for an airway issue at this point.  She has no problems with reflux and she is not treating reflux at this point.  She had evaluation for abnormalities identified on her CBC with oncology and she had follow-up blood test which identified MGUS with a IgG kappa protein at 0.5 g/DL.  She has yet to follow-up with oncology concerning this issue.  Allergies as of 06/23/2023       Reactions   Latex Itching, Rash   Metoprolol  Tartrate Other (See Comments), Shortness Of Breath   Ace Inhibitors Cough   Angiotensin Receptor Blockers Swelling, Other (See Comments)   Tongue swelling   Fentanyl  Other (See Comments)   Hallucinations I go crazy     Guaifenesin  Er Diarrhea   Other Reaction(s): Not available   Hydrochlorothiazide Other (See Comments)   Decreased Sodium   Hydroxychloroquine Other (See Comments)   Decreased sodium   Penicillamine Nausea And Vomiting   Penicillins Other (See Comments)   Yeast infections Has patient had a PCN reaction causing immediate rash, facial/tongue/throat swelling, SOB  or lightheadedness with hypotension:No Has patient had a PCN reaction causing severe rash involving mucus membranes or skin necrosis: No Has patient had a PCN reaction that required hospitalization No Has patient had a PCN reaction occurring within the last 10 years: Yes If all of the above answers are NO, then may proceed with Cephalosporin use. Other reaction(s): yeast infection        Medication List    albuterol  108 (90 Base) MCG/ACT inhaler Commonly known as: VENTOLIN  HFA Inhale 2 puffs into the lungs every 4 (four) hours as needed for wheezing or shortness of breath.   atorvastatin  40 MG tablet Commonly known as: LIPITOR Take 1 tablet (40 mg total) by mouth daily.   CALCIUM  + D3 PO Take 1 tablet by mouth daily.   CALCIUM -VITAMIN D  PO Take 1 tablet by mouth daily.   candesartan  8 MG tablet Commonly known as: ATACAND  Take 1 tablet (8 mg total) by mouth daily.   clopidogrel  75 MG tablet Commonly known as: PLAVIX  Take 1 tablet (75 mg total) by mouth daily.   fluticasone  50 MCG/ACT nasal spray Commonly known as: FLONASE  Place 2 sprays into both nostrils every morning.   leflunomide  20 MG tablet Commonly known as: ARAVA  Take 1 tablet (20 mg total) by mouth daily.   loperamide 2 MG tablet Commonly known as: IMODIUM A-D Take 2 mg by mouth as needed.   meclizine  25 MG tablet Commonly known as: ANTIVERT  Take 1 tablet (25 mg total) by mouth up to 3 (  three) times daily as needed for vertigo.   METAMUCIL FIBER PO Take by mouth See admin instructions. Mix 1 tablespoonful of powder into 4-8 ounces of desired beverage and drink once a day   PreserVision AREDS Caps Take by mouth.   Trelegy Ellipta  100-62.5-25 MCG/ACT Aepb Generic drug: Fluticasone -Umeclidin-Vilant Inhale 1 puff into the lungs daily.   Vitamin B12 1000 MCG Tbcr Take 1 tablet by mouth daily.    Past Medical History:  Diagnosis Date   Abnormal CT scan    Allergic rhinitis    Anemia 2015    after lung surgery   Angio-edema    Complication of anesthesia    Fentanyl  allergy, claustrophobia   COPD (chronic obstructive pulmonary disease) (HCC)    Depression    Diaphoresis 07/30/2015   Excessive sweating with little exertion   Difficult intravenous access    GERD (gastroesophageal reflux disease)    In the past   Hyperlipidemia    Hypertension    Left bundle branch block    Lung cancer (HCC) 2015   left upper lobe wedge removed-no further tx.   Neuropathy    Tingling in arms, fingers, and feet (Since 06/15/16)   Obesity    OSA (obstructive sleep apnea)    denies.   Osteoarthritis    ra also   Osteoporosis    Recurrent upper respiratory infection (URI)    Rheumatoid arthritis(714.0)    Dr. Ebbie Goldmann   SOB (shortness of breath) on exertion    Uterine cancer (HCC) 1971   tx surgical    Past Surgical History:  Procedure Laterality Date   ABDOMINAL HYSTERECTOMY     in situ carcinoma partial   CARDIAC CATHETERIZATION  01/27/11   minor non-obs CAD, NL EF, mild pulm HTN   CATARACT EXTRACTION, BILATERAL     last done 12'16-   COLONOSCOPY W/ BIOPSIES AND POLYPECTOMY     COLONOSCOPY WITH PROPOFOL  N/A 02/13/2015   Procedure: COLONOSCOPY WITH PROPOFOL ;  Surgeon: Jolinda Necessary, MD;  Location: [facility];  Service: Endoscopy;  Laterality: N/A;   ESOPHAGOGASTRODUODENOSCOPY (EGD) WITH PROPOFOL  N/A 09/30/2016   Procedure: ESOPHAGOGASTRODUODENOSCOPY (EGD) WITH PROPOFOL ;  Surgeon: Jolinda Necessary, MD;  Location: [facility];  Service: Endoscopy;  Laterality: N/A;   LYMPH NODE DISSECTION Left 04/12/2013   Procedure: LYMPH NODE DISSECTION;  Surgeon: Norita Beauvais, MD;  Location: [facility] OR;  Service: Thoracic;  Laterality: Left;   RIGHT/LEFT HEART CATH AND CORONARY ANGIOGRAPHY N/A 10/08/2016   Procedure: RIGHT/LEFT HEART CATH AND CORONARY ANGIOGRAPHY;  Surgeon: Odie Benne, MD;  Location: [facility];  Service: Cardiovascular;  Laterality: N/A;   TONSILLECTOMY      VESICOVAGINAL FISTULA CLOSURE W/ TAH  1971   VIDEO ASSISTED THORACOSCOPY (VATS)/THOROCOTOMY Left 04/12/2013   Procedure: VIDEO ASSISTED THORACOSCOPY (VATS)/THOROCOTOMY, WITH LEFT UPPER LOBE WEDGE RESECTION, CHEST WALL BIOPSY ;  Surgeon: Norita Beauvais, MD;  Location: [facility] OR;  Service: Thoracic;  Laterality: Left;   VIDEO BRONCHOSCOPY N/A 04/12/2013   Procedure: VIDEO BRONCHOSCOPY;  Surgeon: Norita Beauvais, MD;  Location: [facility] OR;  Service: Thoracic;  Laterality: N/A;    Review of systems negative except as noted in HPI / PMHx or noted below:  Review of Systems  Constitutional: Negative.   HENT: Negative.    Eyes: Negative.   Respiratory: Negative.    Cardiovascular: Negative.   Gastrointestinal: Negative.   Genitourinary: Negative.   Musculoskeletal: Negative.   Skin: Negative.   Neurological: Negative.   Endo/Heme/Allergies: Negative.   Psychiatric/Behavioral:  Negative.       Objective:   Vitals:   06/23/23 1042  BP: 114/66  Pulse: 68  Temp: 98.4 F (36.9 C)  SpO2: 94%      Weight: 149 lb 9.6 oz (67.9 kg)   Physical Exam Constitutional:      Appearance: She is not diaphoretic.  HENT:     Head: Normocephalic.     Right Ear: Tympanic membrane, ear canal and external ear normal.     Left Ear: Tympanic membrane, ear canal and external ear normal.     Nose: Nose normal. No mucosal edema or rhinorrhea.     Mouth/Throat:     Pharynx: Uvula midline. No oropharyngeal exudate.   Eyes:     Conjunctiva/sclera: Conjunctivae normal.   Neck:     Thyroid : No thyromegaly.     Trachea: Trachea normal. No tracheal tenderness or tracheal deviation.   Cardiovascular:     Rate and Rhythm: Normal rate and regular rhythm.     Heart sounds: Normal heart sounds, S1 normal and S2 normal. No murmur heard. Pulmonary:     Effort: No respiratory distress.     Breath sounds: Normal breath sounds. No stridor. No wheezing or rales.  Lymphadenopathy:     Head:     Right side of head: No  tonsillar adenopathy.     Left side of head: No tonsillar adenopathy.     Cervical: No cervical adenopathy.   Skin:    Findings: No erythema or rash.     Nails: There is no clubbing.   Neurological:     Mental Status: She is alert.     Diagnostics:    Spirometry was performed and demonstrated an FEV1 of 0.98 at 56 % of predicted.  Results of blood tests obtained 15 June 2023 identifies M protein 0.5G/DL with IgG kappa light chain specificity with a kappa free light chain level of 34.7 mg/L, lambda free light chain level of 25.6 mg/L, hemoglobin 9.6, WBC 4.7, platelet 246  Results of blood test obtained 15 June 2023 identifies no monoclonal B cells or immunophenotypically abnormal T cells and less than 0.05% CD34 positive blast.  Assessment and Plan:   1. COPD with asthma (HCC)   2. Perennial allergic rhinitis   3. LPRD (laryngopharyngeal reflux disease)   4. IgG monoclonal gammopathy of undetermined significance (MGUS)     1.  Continue to treat inflammation of airway:  A. Flonase  -1-2 sprays each nostril 1 time per day B. Trelegy 100 - 1 inhalation 1 time per day.    3.  If needed:   A. Albuterol  - 2 inhalations every 4-6 hours.  4. Return to clinic in 6 months or earlier if problem  5. MGUS  6. OTC combination influenza / Covid swab  Shamyah Mae is doing well regarding her respiratory tract while using Trelegy and some occasional Flonase  and we will keep her on this anti-inflammatory plan for her respiratory tract at this point I am see her back in this clinic in 6 months.  Her LPR requires no therapy at this point in time.  She can follow-up with oncology regarding further discussion concerning MGUS.   Schuyler Custard, MD Allergy / Immunology [facility]

## 2023-06-24 ENCOUNTER — Encounter: Payer: Self-pay | Admitting: Allergy and Immunology

## 2023-06-25 ENCOUNTER — Encounter: Payer: Self-pay | Admitting: Physical Therapy

## 2023-06-25 ENCOUNTER — Ambulatory Visit: Admitting: Physical Therapy

## 2023-06-25 DIAGNOSIS — R2689 Other abnormalities of gait and mobility: Secondary | ICD-10-CM

## 2023-06-25 DIAGNOSIS — R278 Other lack of coordination: Secondary | ICD-10-CM

## 2023-06-25 DIAGNOSIS — M6281 Muscle weakness (generalized): Secondary | ICD-10-CM | POA: Diagnosis not present

## 2023-06-25 NOTE — Therapy (Signed)
 OUTPATIENT PHYSICAL THERAPY FEMALE PELVIC TREATMENT   Patient Name: Taylor Berry MRN: [id number] DOB:Aug 29, 1940, 83 y.o., female Today's Date: 06/25/2023  END OF SESSION:  PT End of Session - 06/25/23 1404     Visit Number 6    Authorization Type [employer]    Authorization Time Period 07/26/2013    PT Start Time 1400    PT Stop Time 1445    PT Time Calculation (min) 45 min    Activity Tolerance Patient tolerated treatment well    Behavior During Therapy [facility] for tasks assessed/performed              Past Medical History:  Diagnosis Date   Abnormal CT scan    Allergic rhinitis    Anemia 2015   after lung surgery   Angio-edema    Complication of anesthesia    Fentanyl  allergy, claustrophobia   COPD (chronic obstructive pulmonary disease) (HCC)    Depression    Diaphoresis 07/30/2015   Excessive sweating with little exertion   Difficult intravenous access    GERD (gastroesophageal reflux disease)    In the past   Hyperlipidemia    Hypertension    Left bundle branch block    Lung cancer (HCC) 2015   left upper lobe wedge removed-no further tx.   Neuropathy    Tingling in arms, fingers, and feet (Since 06/15/16)   Obesity    OSA (obstructive sleep apnea)    denies.   Osteoarthritis    ra also   Osteoporosis    Recurrent upper respiratory infection (URI)    Rheumatoid arthritis(714.0)    Dr. Ebbie Goldmann   SOB (shortness of breath) on exertion    Uterine cancer (HCC) 1971   tx surgical   Past Surgical History:  Procedure Laterality Date   ABDOMINAL HYSTERECTOMY     in situ carcinoma partial   CARDIAC CATHETERIZATION  01/27/11   minor non-obs CAD, NL EF, mild pulm HTN   CATARACT EXTRACTION, BILATERAL     last done 12'16-   COLONOSCOPY W/ BIOPSIES AND POLYPECTOMY     COLONOSCOPY WITH PROPOFOL  N/A 02/13/2015   Procedure: COLONOSCOPY WITH PROPOFOL ;  Surgeon: Jolinda Necessary, MD;  Location: [facility];  Service: Endoscopy;  Laterality: N/A;    ESOPHAGOGASTRODUODENOSCOPY (EGD) WITH PROPOFOL  N/A 09/30/2016   Procedure: ESOPHAGOGASTRODUODENOSCOPY (EGD) WITH PROPOFOL ;  Surgeon: Jolinda Necessary, MD;  Location: [facility];  Service: Endoscopy;  Laterality: N/A;   LYMPH NODE DISSECTION Left 04/12/2013   Procedure: LYMPH NODE DISSECTION;  Surgeon: Norita Beauvais, MD;  Location: [facility] OR;  Service: Thoracic;  Laterality: Left;   RIGHT/LEFT HEART CATH AND CORONARY ANGIOGRAPHY N/A 10/08/2016   Procedure: RIGHT/LEFT HEART CATH AND CORONARY ANGIOGRAPHY;  Surgeon: Odie Benne, MD;  [facility]: [facility];  Service: Cardiovascular;  Laterality: N/A;   TONSILLECTOMY     VESICOVAGINAL FISTULA CLOSURE W/ TAH  1971   VIDEO ASSISTED THORACOSCOPY (VATS)/THOROCOTOMY Left 04/12/2013   Procedure: VIDEO ASSISTED THORACOSCOPY (VATS)/THOROCOTOMY, WITH LEFT UPPER LOBE WEDGE RESECTION, CHEST WALL BIOPSY ;  Surgeon: Norita Beauvais, MD;  Location: [facility] OR;  Service: Thoracic;  Laterality: Left;   VIDEO BRONCHOSCOPY N/A 04/12/2013   Procedure: VIDEO BRONCHOSCOPY;  Surgeon: Norita Beauvais, MD;  Location: [facility] OR;  Service: Thoracic;  Laterality: N/A;   Patient Active Problem List   Diagnosis Date Noted   Impacted cerumen of right ear 01/17/2023   Dizziness 01/17/2023   Central perforation of tympanic membrane of right ear 01/17/2023  Other specified disorders of eustachian tube, right ear 01/17/2023   Hepatic lesion 12/20/2021   Chronic systolic heart failure (HCC)    Bradycardia 05/23/2021   Postural dizziness with presyncope 05/23/2021   Acute prerenal azotemia 05/23/2021   Cerebral thrombosis with cerebral infarction 01/02/2021   Encephalopathy acute/expressive aphasia  12/30/2020   Hyponatremia 12/30/2020   GERD (gastroesophageal reflux disease) 12/30/2020   Hypocalcemia 12/30/2020   Acute on chronic combined systolic and diastolic CHF (congestive heart failure) (HCC) 04/28/2019   non obstructive CAD 04/28/2019   Edema 04/14/2019    Abnormal CT of the chest 03/22/2018   Acid reflux 07/13/2017   Affective psychosis (HCC) 07/13/2017   Alcohol dependence in remission (HCC) 07/13/2017   BD (Bowen's disease) 07/13/2017   History of colon polyps 07/13/2017   Rheumatoid arthritis (HCC) 07/13/2017   Chronic bronchitis (HCC) 07/13/2017   Secondary pulmonary hypertension 07/13/2017   Obstructive apnea 07/13/2017   Age related osteoporosis 07/13/2017   Non-ischemic cardiomyopathy/diastolic and systolic CHF    Hyperhidrosis 11/91/4782   Anemia in neoplastic disease 05/02/2013   Allergic rhinitis 04/29/2013   Depression 04/29/2013   Lung cancer, left upper lobe 02/03/2013   Pulmonary infiltrates 01/05/2013   Mild pulmonary hypertension (HCC) 02/17/2011   OSA (obstructive sleep apnea) 02/17/2011   Dyspnea on exertion 11/22/2010   COPD (chronic obstructive pulmonary disease) (HCC) 07/20/2009   Carcinoma in situ of cervix uteri 06/26/2009   Pure hypercholesterolemia 06/26/2009   OBESITY 06/26/2009   Chronic disease anemia 06/26/2009   Essential (primary) hypertension 06/26/2009   ARTHRITIS, RHEUMATOID 06/26/2009   OSTEOPOROSIS 06/26/2009   Cough 06/26/2009    PCP: Benedetto Brady, MD  REFERRING PROVIDER: Albertina Hugger, MD   REFERRING DIAG: R15.9 (ICD-10-CM) - Incontinence of feces, unspecified fecal incontinence type   THERAPY DIAG:  Muscle weakness (generalized)  Other lack of coordination  Other abnormalities of gait and mobility  Rationale for Evaluation and Treatment: Rehabilitation  ONSET DATE: pt reports 5 years  SUBJECTIVE:                                                                                                                                                                                           SUBJECTIVE STATEMENT: Pt reports that she felt good after last visit.  No pain today,  Will have  a follow up about blood work next week.  Feels improvement, can make it to bathroom, can make it  to the bathroom better.  Saw her GI dr, he was pleased with her progress.     Fluid intake: coffee, water  PAIN: no pain  PRECAUTIONS: None  RED FLAGS:  None   WEIGHT BEARING RESTRICTIONS: No  FALLS:  Has patient fallen in last 6 months? No  OCCUPATION: retired  ACTIVITY LEVEL : not very active as far as exercise since her stroke, drives, goes to appts, goes to the grocery store, plays violin in 2 symphonies, is sure that she knows not being so active has something to do with this   PLOF: Independent with household mobility with device  PATIENT GOALS: to be well, to able to stand up and walk right and not have fecal incontinence, it is frustrating   PERTINENT HISTORY:  Hysterectomy, CHF, COPD Sexual abuse: No  BOWEL MOVEMENT: Sometimes it will happen when she walks around the grocery store, accidents are random, sometimes it just happens, sometimes when she eats something spicy, unpredictable Pain with bowel movement: No Type of bowel movement:Type (Bristol Stool Scale) 3 or 6, when it is 6 she might have an accident, he just changed her medication Fully empty rectum: No- sometimes she does not Leakage: Yes:   Pads: Yes: all the time Fiber supplement/laxative Yes Metamucil- 2 capsules/ day. Will start Immodium tonight  URINATION: has to get up in the night, when she lies down and sits up, she does not have the strength to hold it Pain with urination: No Fully empty bladder: Yes:   Stream: Strong Urgency: Yes  Frequency: when she is going from lying down to sitting up, she leaks, during the day, it's a little bit of leaks, her and there Leakage: Urge to void, Walking to the bathroom, Coughing, and Sneezing Pads: Yes: 2- 3 / day  INTERCOURSE: not active   PREGNANCY: Vaginal deliveries 2- breech and 1 premature, almost died during the delivery Tearing No Episiotomy Yes  C-section deliveries 0 Currently pregnant No  PROLAPSE: None   OBJECTIVE:  Note:  Objective measures were completed at Evaluation unless otherwise noted.   PATIENT SURVEYS:    PFIQ-7: 119  COGNITION: Overall cognitive status: Within functional limits for tasks assessed     SENSATION: Light touch: Appears intact  LUMBAR SPECIAL TESTS:  Single leg stance test: Positive- decreased balance and weakness  GAIT: Assistive device utilized: Single point cane Comments: slow, antalgic  POSTURE: rounded shoulders, forward head, decreased lumbar lordosis, and flexed trunk    LUMBARAROM/PROM: limited throughout  LOWER EXTREMITY [id number] throughout   LOWER EXTREMITY MMT: 4-/5 bilateral hips and knees  PALPATION:   General: difficulty with engaging abdomen and pelvic floor  Pelvic Alignment: even  Abdominal: restrictions throughout abdomen                External Perineal Exam: deferred                              Internal Pelvic Floor: deferred   Patient confirms identification and approves PT to assess internal pelvic floor and treatment No  PELVIC MMT:   MMT eval  Vaginal   Internal Anal Sphincter   External Anal Sphincter   Puborectalis   Diastasis Recti no  (Blank rows = not tested)        TONE: Deferred   PROLAPSE: Deferred   TODAY'S TREATMENT:  DATE: 06/16/2023   Neuro reed- Nu step- Nu step- 14 mins- with therapist present to discuss progress                     Ball press with transverse abdominis breath  Hip adduction with ball with transverse abdominis breath  with thera band hold Ball press into table bilateral 10 reps       Neuro reed- ball press with pelvic floor lift, with transverse abdominis breath Hip adduction with ball  Sit to stand with transverse abdominis breath and pelvic floor contraction with red theraband holding Nustep 5:45 mins  Arm bike 2+2 mins Supine to sit with transverse  abdominis breath and  PF contraction   PATIENT EDUCATION:  Education details: HEP, expectations of PT, exam findings Person educated: Patient Education method: Explanation, Demonstration, Tactile cues, Verbal cues, and Handouts Education comprehension: verbalized understanding, returned demonstration, verbal cues required, tactile cues required, and needs further education  HOME EXERCISE PROGRAM:  [id number]  ASSESSMENT:  CLINICAL IMPRESSION:  Pt did well with her exercises today with coordination of her core and breath with transfers. Seated exercises work well for her. One fecal accident after she had a sweet cheesecake.  Progressing slowly but well. Improved  energy today.  Will benefit from cont PT.      OBJECTIVE IMPAIRMENTS: Abnormal gait, cardiopulmonary status limiting activity, decreased activity tolerance, decreased balance, decreased cognition, decreased coordination, decreased mobility, difficulty walking, decreased ROM, decreased strength, hypomobility, impaired flexibility, impaired tone, postural dysfunction, and pain.   ACTIVITY LIMITATIONS: carrying, lifting, bending, sitting, standing, squatting, sleeping, stairs, transfers, continence, toileting, reach over head, locomotion level, and caring for others  PARTICIPATION LIMITATIONS: meal prep, cleaning, laundry, personal finances, driving, shopping, community activity, occupation, yard work, and church  PERSONAL FACTORS: Age, Fitness, Time since onset of injury/illness/exacerbation, and 3+ comorbidities: CHF, COPD, rheumatoid arthritis are also affecting patient's functional outcome.   REHAB POTENTIAL: Fair    CLINICAL DECISION MAKING: Evolving/moderate complexity  EVALUATION COMPLEXITY: Moderate   GOALS: Goals reviewed with patient? Yes  SHORT TERM GOALS: Target date: 06/01/2023    Pt will be independent with HEP.   Baseline: Goal status: not yet  2.  Pt will be able to stand at least 10 mins without  increased hip, knee and back pain Baseline:  Goal status: INITIAL  3.  Pt will report reducing fecal accidents to at most once/ week Baseline:  Goal status: met  4.  Pt will report reducing urinary leaking to max  once/ week Baseline:  Goal status: not yet   LONG TERM GOALS: Target date: 07/27/2023  Pt will be independent with advanced HEP.   Baseline:  Goal status: INITIAL  2.  Pt will be independent with use of squatty potty, relaxed toileting mechanics, and improved bowel movement techniques in order to increase ease of bowel movements and complete evacuation.   Baseline:  Goal status: INITIAL  3.  Pt will be able to functional actions such as transfer in bed from supine to sit  without leakage  Baseline:  Goal status: INITIAL  4.  Pt will have no fecal or urinary accidents/ week to be able to participate in community activities Baseline:  Goal status: INITIAL   PLAN:  PT FREQUENCY: 1-2x/week  PT DURATION: 12 weeks  PLANNED INTERVENTIONS: 97110-Therapeutic exercises, 97530- Therapeutic activity, 97112- Neuromuscular re-education, 97535- Self Care, [id number]- Manual therapy, [phone]- Gait training, [phone]- Electrical stimulation (manual), Patient/Family education, Stair training, Taping, Dry Needling, Joint mobilization, Joint  manipulation, Spinal manipulation, Spinal mobilization, Scar mobilization, Cryotherapy, Moist heat, and Biofeedback  PLAN FOR NEXT SESSION: cont exercises   Karlye Ihrig, PT 06/25/2023, 2:06 PM

## 2023-06-29 ENCOUNTER — Ambulatory Visit: Admitting: Physical Therapy

## 2023-06-29 ENCOUNTER — Encounter: Payer: Self-pay | Admitting: Physical Therapy

## 2023-06-29 DIAGNOSIS — R2689 Other abnormalities of gait and mobility: Secondary | ICD-10-CM

## 2023-06-29 DIAGNOSIS — M6281 Muscle weakness (generalized): Secondary | ICD-10-CM

## 2023-06-29 DIAGNOSIS — R278 Other lack of coordination: Secondary | ICD-10-CM

## 2023-06-29 NOTE — Therapy (Signed)
 OUTPATIENT PHYSICAL THERAPY FEMALE PELVIC TREATMENT   Patient Name: Taylor Berry MRN: [id number] DOB:[birth year], 83 y.o., female Today's Date: 06/29/2023  END OF SESSION:  PT End of Session - 06/29/23 1406     Visit Number 7    Authorization Type [employer]    Authorization Time Period 07/26/2013    PT Start Time 1400    PT Stop Time 1445    PT Time Calculation (min) 45 min    Activity Tolerance Patient tolerated treatment well    Behavior During Therapy [facility] for tasks assessed/performed              Past Medical History:  Diagnosis Date   Abnormal CT scan    Allergic rhinitis    Anemia 2015   after lung surgery   Angio-edema    Complication of anesthesia    Fentanyl  allergy, claustrophobia   COPD (chronic obstructive pulmonary disease) (HCC)    Depression    Diaphoresis 07/30/2015   Excessive sweating with little exertion   Difficult intravenous access    GERD (gastroesophageal reflux disease)    In the past   Hyperlipidemia    Hypertension    Left bundle branch block    Lung cancer (HCC) 2015   left upper lobe wedge removed-no further tx.   Neuropathy    Tingling in arms, fingers, and feet (Since 06/15/16)   Obesity    OSA (obstructive sleep apnea)    denies.   Osteoarthritis    ra also   Osteoporosis    Recurrent upper respiratory infection (URI)    Rheumatoid arthritis(714.0)    Dr. Mai   SOB (shortness of breath) on exertion    Uterine cancer (HCC) 1971   tx surgical   Past Surgical History:  Procedure Laterality Date   ABDOMINAL HYSTERECTOMY     in situ carcinoma partial   CARDIAC CATHETERIZATION  01/27/11   minor non-obs CAD, NL EF, mild pulm HTN   CATARACT EXTRACTION, BILATERAL     last done 12'16-   COLONOSCOPY W/ BIOPSIES AND POLYPECTOMY     COLONOSCOPY WITH PROPOFOL  N/A 02/13/2015   Procedure: COLONOSCOPY WITH PROPOFOL ;  Surgeon: Lynwood Bohr, MD;  Location: [facility];  Service: Endoscopy;  Laterality: N/A;    ESOPHAGOGASTRODUODENOSCOPY (EGD) WITH PROPOFOL  N/A 09/30/2016   Procedure: ESOPHAGOGASTRODUODENOSCOPY (EGD) WITH PROPOFOL ;  Surgeon: Bohr Lynwood, MD;  Location: [facility];  Service: Endoscopy;  Laterality: N/A;   LYMPH NODE DISSECTION Left 04/12/2013   Procedure: LYMPH NODE DISSECTION;  Surgeon: Dallas KATHEE Jude, MD;  Location: [facility] OR;  Service: Thoracic;  Laterality: Left;   RIGHT/LEFT HEART CATH AND CORONARY ANGIOGRAPHY N/A 10/08/2016   Procedure: RIGHT/LEFT HEART CATH AND CORONARY ANGIOGRAPHY;  Surgeon: Verlin Lonni BIRCH, MD;  Location: [facility];  Service: Cardiovascular;  Laterality: N/A;   TONSILLECTOMY     VESICOVAGINAL FISTULA CLOSURE W/ TAH  1971   VIDEO ASSISTED THORACOSCOPY (VATS)/THOROCOTOMY Left 04/12/2013   Procedure: VIDEO ASSISTED THORACOSCOPY (VATS)/THOROCOTOMY, WITH LEFT UPPER LOBE WEDGE RESECTION, CHEST WALL BIOPSY ;  Surgeon: Dallas KATHEE Jude, MD;  Location: [facility] OR;  Service: Thoracic;  Laterality: Left;   VIDEO BRONCHOSCOPY N/A 04/12/2013   Procedure: VIDEO BRONCHOSCOPY;  Surgeon: Dallas KATHEE Jude, MD;  Location: [facility] OR;  Service: Thoracic;  Laterality: N/A;   Patient Active Problem List   Diagnosis Date Noted   Impacted cerumen of right ear 01/17/2023   Dizziness 01/17/2023   Central perforation of tympanic membrane of right ear 01/17/2023  Other specified disorders of eustachian tube, right ear 01/17/2023   Hepatic lesion 12/20/2021   Chronic systolic heart failure (HCC)    Bradycardia 05/23/2021   Postural dizziness with presyncope 05/23/2021   Acute prerenal azotemia 05/23/2021   Cerebral thrombosis with cerebral infarction 01/02/2021   Encephalopathy acute/expressive aphasia  12/30/2020   Hyponatremia 12/30/2020   GERD (gastroesophageal reflux disease) 12/30/2020   Hypocalcemia 12/30/2020   Acute on chronic combined systolic and diastolic CHF (congestive heart failure) (HCC) 04/28/2019   non obstructive CAD 04/28/2019   Edema 04/14/2019    Abnormal CT of the chest 03/22/2018   Acid reflux 07/13/2017   Affective psychosis (HCC) 07/13/2017   Alcohol dependence in remission (HCC) 07/13/2017   BD (Bowen's disease) 07/13/2017   History of colon polyps 07/13/2017   Rheumatoid arthritis (HCC) 07/13/2017   Chronic bronchitis (HCC) 07/13/2017   Secondary pulmonary hypertension 07/13/2017   Obstructive apnea 07/13/2017   Age related osteoporosis 07/13/2017   Non-ischemic cardiomyopathy/diastolic and systolic CHF    Hyperhidrosis 93/86/7981   Anemia in neoplastic disease 05/02/2013   Allergic rhinitis 04/29/2013   Depression 04/29/2013   Lung cancer, left upper lobe 02/03/2013   Pulmonary infiltrates 01/05/2013   Mild pulmonary hypertension (HCC) 02/17/2011   OSA (obstructive sleep apnea) 02/17/2011   Dyspnea on exertion 11/22/2010   COPD (chronic obstructive pulmonary disease) (HCC) 07/20/2009   Carcinoma in situ of cervix uteri 06/26/2009   Pure hypercholesterolemia 06/26/2009   OBESITY 06/26/2009   Chronic disease anemia 06/26/2009   Essential (primary) hypertension 06/26/2009   ARTHRITIS, RHEUMATOID 06/26/2009   OSTEOPOROSIS 06/26/2009   Cough 06/26/2009    PCP: Leonel Cole, MD  REFERRING PROVIDER: Legrand Victory LITTIE DOUGLAS, MD   REFERRING DIAG: R15.9 (ICD-10-CM) - Incontinence of feces, unspecified fecal incontinence type   THERAPY DIAG:  Muscle weakness (generalized)  Other lack of coordination  Other abnormalities of gait and mobility  Rationale for Evaluation and Treatment: Rehabilitation  ONSET DATE: pt reports 5 years  SUBJECTIVE:                                                                                                                                                                                           SUBJECTIVE STATEMENT: Pt reports that she is doing pretty well, she is doing well with the exercises  when she is here ( in PT).  Feels improvement, can make it to bathroom, can make it to the  bathroom better.  Saw her GI dr, he was pleased with her progress.     Fluid intake: coffee, water  PAIN: no pain  PRECAUTIONS: None  RED FLAGS: None  WEIGHT BEARING RESTRICTIONS: No  FALLS:  Has patient fallen in last 6 months? No  OCCUPATION: retired  ACTIVITY LEVEL : not very active as far as exercise since her stroke, drives, goes to appts, goes to the grocery store, plays violin in 2 symphonies, is sure that she knows not being so active has something to do with this   PLOF: Independent with household mobility with device  PATIENT GOALS: to be well, to able to stand up and walk right and not have fecal incontinence, it is frustrating   PERTINENT HISTORY:  Hysterectomy, CHF, COPD Sexual abuse: No  BOWEL MOVEMENT: Sometimes it will happen when she walks around the grocery store, accidents are random, sometimes it just happens, sometimes when she eats something spicy, unpredictable Pain with bowel movement: No Type of bowel movement:Type (Bristol Stool Scale) 3 or 6, when it is 6 she might have an accident, he just changed her medication Fully empty rectum: No- sometimes she does not Leakage: Yes:   Pads: Yes: all the time Fiber supplement/laxative Yes Metamucil- 2 capsules/ day. Will start Immodium tonight  URINATION: has to get up in the night, when she lies down and sits up, she does not have the strength to hold it Pain with urination: No Fully empty bladder: Yes:   Stream: Strong Urgency: Yes  Frequency: when she is going from lying down to sitting up, she leaks, during the day, it's a little bit of leaks, her and there Leakage: Urge to void, Walking to the bathroom, Coughing, and Sneezing Pads: Yes: 2- 3 / day  INTERCOURSE: not active   PREGNANCY: Vaginal deliveries 2- breech and 1 premature, almost died during the delivery Tearing No Episiotomy Yes  C-section deliveries 0 Currently pregnant No  PROLAPSE: None   OBJECTIVE:  Note: Objective  measures were completed at Evaluation unless otherwise noted.   PATIENT SURVEYS:    PFIQ-7: 119  COGNITION: Overall cognitive status: Within functional limits for tasks assessed     SENSATION: Light touch: Appears intact  LUMBAR SPECIAL TESTS:  Single leg stance test: Positive- decreased balance and weakness  GAIT: Assistive device utilized: Single point cane Comments: slow, antalgic  POSTURE: rounded shoulders, forward head, decreased lumbar lordosis, and flexed trunk    LUMBARAROM/PROM: limited throughout  LOWER EXTREMITY MNF:opfpuzi throughout   LOWER EXTREMITY MMT: 4-/5 bilateral hips and knees  PALPATION:   General: difficulty with engaging abdomen and pelvic floor  Pelvic Alignment: even  Abdominal: restrictions throughout abdomen                External Perineal Exam: deferred                              Internal Pelvic Floor: deferred   Patient confirms identification and approves PT to assess internal pelvic floor and treatment No  PELVIC MMT:   MMT eval  Vaginal   Internal Anal Sphincter   External Anal Sphincter   Puborectalis   Diastasis Recti no  (Blank rows = not tested)        TONE: Deferred   PROLAPSE: Deferred   TODAY'S TREATMENT:  DATE: 06/29/2023 Neuro reed- Nu step- Nu step- 8 mins- with therapist present to discuss progress Leg press- 40 lbs with transverse abdominis breath 30 reps Arm bike - 2 mins forward and 3 mins backwards Hip adduction with ball with transverse abdominis breath 20 reps    06/16/2023   Neuro reed- Nu step- Nu step- 14 mins- with therapist present to discuss progress                     [employer] press with transverse abdominis breath  Hip adduction with ball with transverse abdominis breath  with thera band hold Ball press into table bilateral 10 reps       Neuro reed- ball  press with pelvic floor lift, with transverse abdominis breath Hip adduction with ball  Sit to stand with transverse abdominis breath and pelvic floor contraction with red theraband holding Nustep 5:45 mins  Arm bike 2+2 mins Supine to sit with transverse abdominis breath and  PF contraction   PATIENT EDUCATION:  Education details: HEP, expectations of PT, exam findings Person educated: Patient Education method: Explanation, Demonstration, Tactile cues, Verbal cues, and Handouts Education comprehension: verbalized understanding, returned demonstration, verbal cues required, tactile cues required, and needs further education  HOME EXERCISE PROGRAM:  [id number]  ASSESSMENT:  CLINICAL IMPRESSION:  Pt did well with her exercises today with coordination of her core and breath with transfers. Seated exercises work well for her. One little fecal accident - something she ate 2 weeks ago. Nothing recently. She thinks she could be bothered by dairy.  Progressing slowly but well. Improved  energy today. Trying to strengthen overall for improved endurance and mobility.  Will benefit from cont PT.      OBJECTIVE IMPAIRMENTS: Abnormal gait, cardiopulmonary status limiting activity, decreased activity tolerance, decreased balance, decreased cognition, decreased coordination, decreased mobility, difficulty walking, decreased ROM, decreased strength, hypomobility, impaired flexibility, impaired tone, postural dysfunction, and pain.   ACTIVITY LIMITATIONS: carrying, lifting, bending, sitting, standing, squatting, sleeping, stairs, transfers, continence, toileting, reach over head, locomotion level, and caring for others  PARTICIPATION LIMITATIONS: meal prep, cleaning, laundry, personal finances, driving, shopping, community activity, occupation, yard work, and church  PERSONAL FACTORS: Age, Fitness, Time since onset of injury/illness/exacerbation, and 3+ comorbidities: CHF, COPD, rheumatoid arthritis  are also affecting patient's functional outcome.   REHAB POTENTIAL: Fair    CLINICAL DECISION MAKING: Evolving/moderate complexity  EVALUATION COMPLEXITY: Moderate   GOALS: Goals reviewed with patient? Yes  SHORT TERM GOALS: Target date: 06/01/2023    Pt will be independent with HEP.   Baseline: Goal status: not yet  2.  Pt will be able to stand at least 10 mins without increased hip, knee and back pain Baseline:  Goal status: INITIAL  3.  Pt will report reducing fecal accidents to at most once/ week Baseline:  Goal status: met  4.  Pt will report reducing urinary leaking to max  once/ week Baseline:  Goal status: not yet   LONG TERM GOALS: Target date: 07/27/2023  Pt will be independent with advanced HEP.   Baseline:  Goal status: INITIAL  2.  Pt will be independent with use of squatty potty, relaxed toileting mechanics, and improved bowel movement techniques in order to increase ease of bowel movements and complete evacuation.   Baseline:  Goal status: INITIAL  3.  Pt will be able to functional actions such as transfer in bed from supine to sit  without leakage  Baseline:  Goal status: INITIAL  4.  Pt will have no fecal or urinary accidents/ week to be able to participate in community activities Baseline:  Goal status: INITIAL   PLAN:  PT FREQUENCY: 1-2x/week  PT DURATION: 12 weeks  PLANNED INTERVENTIONS: 97110-Therapeutic exercises, 97530- Therapeutic activity, 97112- Neuromuscular re-education, 97535- Self Care, [id number]- Manual therapy, [phone]- Gait training, [phone]- Electrical stimulation (manual), Patient/Family education, Stair training, Taping, Dry Needling, Joint mobilization, Joint manipulation, Spinal manipulation, Spinal mobilization, Scar mobilization, Cryotherapy, Moist heat, and Biofeedback  PLAN FOR NEXT SESSION: cont exercises   Shanise Balch, PT 06/29/2023, 2:07 PM

## 2023-07-01 ENCOUNTER — Telehealth: Payer: Self-pay | Admitting: Hematology

## 2023-07-01 ENCOUNTER — Inpatient Hospital Stay: Admitting: Oncology

## 2023-07-01 NOTE — Telephone Encounter (Signed)
 I called the patient to inform her that I had accidentally scheduled her with the wrong provider and her appointment has not been moved to Friday.

## 2023-07-03 ENCOUNTER — Inpatient Hospital Stay (HOSPITAL_BASED_OUTPATIENT_CLINIC_OR_DEPARTMENT_OTHER): Admitting: Hematology

## 2023-07-03 DIAGNOSIS — D472 Monoclonal gammopathy: Secondary | ICD-10-CM

## 2023-07-03 DIAGNOSIS — D72810 Lymphocytopenia: Secondary | ICD-10-CM

## 2023-07-03 NOTE — Progress Notes (Signed)
 HEMATOLOGY/ONCOLOGY TELEMEDICINE NOTE  Date of Service: 07/03/23  Patient Care Team: Leonel Cole, MD as PCP - General (Family Medicine) Pietro Redell RAMAN, MD as PCP - Cardiology (Cardiology) Shelah Lamar RAMAN, MD as Consulting Physician (Pulmonary Disease) Mai Lynwood FALCON, MD as Consulting Physician (Rheumatology)  CHIEF COMPLAINTS/PURPOSE OF CONSULTATION:  Evaluation and management of leukopenia  HISTORY OF PRESENTING ILLNESS:   Taylor Berry is a wonderful 83 y.o. female who has been referred to us  by Leonel Cole, MD for evaluation and management of leukopenia.  She was noted to have been previously seen by me on 08/19/2016 for evaluation and management of excessive diaphoresis.  Labs done with PCP on 4/29 showed WBC count of 3.1k with lymphopenia of 600 with normal ANC of 2000.  Patient notes no acute infections. No fevers/chills/night sweats or weight loss.  Notes no other acute new focal infections. Paitent does have a hx of RA and is on Leflunomide . She has also been receiving Prednisone  on and off fof flares.  INTERVAL HISTORY:  Taylor Berry is a 83 y.o. female being connected with via telemedicine visit for continued evaluation and management of leukopenia. She was last seen by me on 06/15/2023 and noted intermittent RA flares managed with Leflunomide .   I connected with Taylor Berry on 07/03/23 at  8:40 AM EDT by telephone visit and verified that I am speaking with the correct person using two identifiers.   I discussed the limitations, risks, security and privacy concerns of performing an evaluation and management service by telemedicine and the availability of in-person appointments. I also discussed with the patient that there may be a patient responsible charge related to this service. The patient expressed understanding and agreed to proceed.   Other persons participating in the visit and their role in the encounter: none   Patient's location: home   Provider's location: [facility]   Chief Complaint: leukopenia  She reports no new symptoms since her last clinic visit.  The results of her most recent labs for leukopenia from 06/15/2023 were discussed with her in detail.  MEDICAL HISTORY:  Past Medical History:  Diagnosis Date   Abnormal CT scan    Allergic rhinitis    Anemia 2015   after lung surgery   Angio-edema    Complication of anesthesia    Fentanyl  allergy, claustrophobia   COPD (chronic obstructive pulmonary disease) (HCC)    Depression    Diaphoresis 07/30/2015   Excessive sweating with little exertion   Difficult intravenous access    GERD (gastroesophageal reflux disease)    In the past   Hyperlipidemia    Hypertension    Left bundle branch block    Lung cancer (HCC) 2015   left upper lobe wedge removed-no further tx.   Neuropathy    Tingling in arms, fingers, and feet (Since 06/15/16)   Obesity    OSA (obstructive sleep apnea)    denies.   Osteoarthritis    ra also   Osteoporosis    Recurrent upper respiratory infection (URI)    Rheumatoid arthritis(714.0)    Dr. Mai   SOB (shortness of breath) on exertion    Uterine cancer (HCC) 1971   tx surgical    SURGICAL HISTORY: Past Surgical History:  Procedure Laterality Date   ABDOMINAL HYSTERECTOMY     in situ carcinoma partial   CARDIAC CATHETERIZATION  01/27/11   minor non-obs CAD, NL EF, mild pulm HTN   CATARACT EXTRACTION, BILATERAL  last done 12'16-   COLONOSCOPY W/ BIOPSIES AND POLYPECTOMY     COLONOSCOPY WITH PROPOFOL  N/A 02/13/2015   Procedure: COLONOSCOPY WITH PROPOFOL ;  Surgeon: Lynwood Bohr, MD;  Location: [facility];  Service: Endoscopy;  Laterality: N/A;   ESOPHAGOGASTRODUODENOSCOPY (EGD) WITH PROPOFOL  N/A 09/30/2016   Procedure: ESOPHAGOGASTRODUODENOSCOPY (EGD) WITH PROPOFOL ;  Surgeon: Bohr Lynwood, MD;  Location: [facility];  Service: Endoscopy;  Laterality: N/A;   LYMPH NODE DISSECTION Left 04/12/2013   Procedure: LYMPH NODE  DISSECTION;  Surgeon: Dallas KATHEE Jude, MD;  Location: [facility] OR;  Service: Thoracic;  Laterality: Left;   RIGHT/LEFT HEART CATH AND CORONARY ANGIOGRAPHY N/A 10/08/2016   Procedure: RIGHT/LEFT HEART CATH AND CORONARY ANGIOGRAPHY;  Surgeon: Verlin Lonni BIRCH, MD;  Location: [facility];  Service: Cardiovascular;  Laterality: N/A;   TONSILLECTOMY     VESICOVAGINAL FISTULA CLOSURE W/ TAH  1971   VIDEO ASSISTED THORACOSCOPY (VATS)/THOROCOTOMY Left 04/12/2013   Procedure: VIDEO ASSISTED THORACOSCOPY (VATS)/THOROCOTOMY, WITH LEFT UPPER LOBE WEDGE RESECTION, CHEST WALL BIOPSY ;  Surgeon: Dallas KATHEE Jude, MD;  Location: [facility] OR;  Service: Thoracic;  Laterality: Left;   VIDEO BRONCHOSCOPY N/A 04/12/2013   Procedure: VIDEO BRONCHOSCOPY;  Surgeon: Dallas KATHEE Jude, MD;  Location: [facility] OR;  Service: Thoracic;  Laterality: N/A;    SOCIAL HISTORY: Social History   Socioeconomic History   Marital status: Widowed    Spouse name: Not on file   Number of children: 2   Years of education: College   Highest education level: Master's degree (e.g., MA, MS, MEng, MEd, MSW, MBA)  Occupational History   Occupation: Glass blower/designer: [employer]  Tobacco Use   Smoking status: Former    Current packs/day: 0.00    Average packs/day: 1 pack/day for 35.0 years (35.0 ttl pk-yrs)    Types: Cigarettes    Start date: 01/06/1953    Quit date: 01/07/1988    Years since quitting: 35.5   Smokeless tobacco: Never  Vaping Use   Vaping status: Never Used  Substance and Sexual Activity   Alcohol use: No    Comment: not since 1988   Drug use: No   Sexual activity: Not Currently  Other Topics Concern   Not on file  Social History Narrative   Not on file   Social Drivers of Health   Financial Resource Strain: Low Risk  (12/06/2019)   Overall Financial Resource Strain (CARDIA)    Difficulty of Paying Living Expenses: Not hard at all  Food Insecurity: No Food Insecurity (12/06/2019)   Hunger Vital Sign    Worried  About Running Out of Food in the Last Year: Never true    Ran Out of Food in the Last Year: Never true  Transportation Needs: No Transportation Needs (12/06/2019)   PRAPARE - Administrator, Civil Service (Medical): No    Lack of Transportation (Non-Medical): No  Physical Activity: Inactive (12/06/2019)   Exercise Vital Sign    Days of Exercise per Week: 0 days    Minutes of Exercise per Session: 0 min  Stress: No Stress Concern Present (12/06/2019)   [employer] of Occupational Health - Occupational Stress Questionnaire    Feeling of Stress : Not at all  Social Connections: Moderately Integrated (12/06/2019)   Social Connection and Isolation Panel    Frequency of Communication with Friends and Family: More than three times a week    Frequency of Social Gatherings with Friends and Family: More than three times a week  Attends Religious Services: More than 4 times per year    Active Member of Clubs or Organizations: Yes    Attends Banker Meetings: 1 to 4 times per year    Marital Status: Widowed  Intimate Partner Violence: Not At Risk (12/06/2019)   Humiliation, Afraid, Rape, and Kick questionnaire    Fear of Current or Ex-Partner: No    Emotionally Abused: No    Physically Abused: No    Sexually Abused: No    FAMILY HISTORY: Family History  Problem Relation Age of Onset   Lung cancer Mother    Coronary artery disease Son    Heart attack Son 71   Arthritis/Rheumatoid Maternal Grandmother    Coronary artery disease Son    Arthritis/Rheumatoid Son     ALLERGIES:  is allergic to latex, metoprolol  tartrate, ace inhibitors, angiotensin receptor blockers, fentanyl , guaifenesin  er, hydrochlorothiazide, hydroxychloroquine, penicillamine, and penicillins.  MEDICATIONS:  Current Outpatient Medications  Medication Sig Dispense Refill   albuterol  (VENTOLIN  HFA) 108 (90 Base) MCG/ACT inhaler Inhale 2 puffs into the lungs every 4 (four) hours as needed  for wheezing or shortness of breath. 6.7 g 1   atorvastatin  (LIPITOR) 40 MG tablet Take 1 tablet (40 mg total) by mouth daily. 90 tablet 3   Calcium  Carb-Cholecalciferol (CALCIUM  + D3 PO) Take 1 tablet by mouth daily.     CALCIUM -VITAMIN D  PO Take 1 tablet by mouth daily.     candesartan  (ATACAND ) 8 MG tablet Take 1 tablet (8 mg total) by mouth daily. 90 tablet 2   clopidogrel  (PLAVIX ) 75 MG tablet Take 1 tablet (75 mg total) by mouth daily. 90 tablet 3   Cyanocobalamin  (VITAMIN B12) 1000 MCG TBCR Take 1 tablet by mouth daily.     fluticasone  (FLONASE ) 50 MCG/ACT nasal spray Place 2 sprays into both nostrils every morning. 48 g 1   Fluticasone -Umeclidin-Vilant (TRELEGY ELLIPTA ) 100-62.5-25 MCG/ACT AEPB Inhale 1 puff into the lungs daily. 180 each 1   leflunomide  (ARAVA ) 20 MG tablet Take 1 tablet (20 mg total) by mouth daily. 90 tablet 1   loperamide (IMODIUM A-D) 2 MG tablet Take 2 mg by mouth as needed.     meclizine  (ANTIVERT ) 25 MG tablet Take 1 tablet (25 mg total) by mouth up to 3 (three) times daily as needed for vertigo. 20 tablet 0   METAMUCIL FIBER PO Take by mouth See admin instructions. Mix 1 tablespoonful of powder into 4-8 ounces of desired beverage and drink once a day     Multiple Vitamins-Minerals (PRESERVISION AREDS) CAPS Take by mouth.     No current facility-administered medications for this visit.    REVIEW OF SYSTEMS:    10 Point review of Systems was done is negative except as noted above.  PHYSICAL EXAMINATION: Telemedicine visit  LABORATORY DATA:  I have reviewed the data as listed  .    Latest Ref Rng & Units 06/15/2023    3:51 PM 06/10/2021   10:45 AM 05/26/2021    2:55 AM  CBC  WBC 4.0 - 10.5 K/uL 4.7  6.7  4.8   Hemoglobin 12.0 - 15.0 g/dL 9.6  88.9  9.5   Hematocrit 36.0 - 46.0 % 29.8  33.4  29.7   Platelets 150 - 400 K/uL 246  252  202    05/06/2023 CBC with Differential:   CBC with differential 11/05/2022:   11/01/2021 CBC with  differential:        Latest Ref Rng & Units 06/15/2023  3:51 PM 05/26/2021    2:55 AM 05/25/2021    1:51 AM  CMP  Glucose 70 - 99 mg/dL 894  95  894   BUN 8 - 23 mg/dL 25  17  20    Creatinine 0.44 - 1.00 mg/dL 9.22  9.18  9.14   Sodium 135 - 145 mmol/L 135  137  138   Potassium 3.5 - 5.1 mmol/L 4.3  4.0  4.0   Chloride 98 - 111 mmol/L 102  110  110   CO2 22 - 32 mmol/L 27  22  23    Calcium  8.9 - 10.3 mg/dL 9.3  9.0  8.9   Total Protein 6.5 - 8.1 g/dL 6.8     Total Bilirubin 0.0 - 1.2 mg/dL 0.4     Alkaline Phos 38 - 126 U/L 87     AST 15 - 41 U/L 29     ALT 0 - 44 U/L 10      05/05/2023 CMP:     11/05/2022 CMP:   11/01/2021 CMP:     05/05/2023 Vitamin D :  11/05/2022 Vitamin D :      RADIOGRAPHIC STUDIES: I have personally reviewed the radiological images as listed and agreed with the findings in the report. No results found.  ASSESSMENT & PLAN:   83 y.o. female h/o RA presenting with lymphopenia:  Leukopenia/Lymphopenia in the context of RA on leflunomide  and use of steroids.  PLAN:  -Discussed lab results from 06/15/2023 in detail with patient. CBC showed WBC of 4.7K, hemoglobin of 9.6, and platelets of 246K. 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 -will need monitoring with rheumatologist from a medication management standpoint. WBC counts not limiting at this time. 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   FOLLOW-UP: Labs in 22 weeks Phone visit with Dr Onesimo in 24 weeks  The total time spent in the appointment was 20 minutes* .  All of the patient's questions were answered with apparent satisfaction. The patient knows to call the clinic with any problems, questions or concerns.   Emaline Onesimo MD MS AAHIVMS [facility] Hematology/Oncology Physician [facility]  .*Total Encounter Time as defined by the Centers for Medicare and Medicaid Services includes, in addition to the face-to-face time of a patient visit (documented in the note above) non-face-to-face time: obtaining and reviewing outside history, ordering and reviewing medications, tests or procedures, care coordination (communications with other health care professionals or caregivers) and documentation in the medical record.    I,Mitra Faeizi,acting as a Neurosurgeon for Emaline Onesimo, MD.,have documented all relevant documentation on the behalf of Emaline Onesimo, MD,as directed by  Emaline Onesimo, MD while in the presence of Emaline Onesimo, MD.  .I have reviewed the above documentation for accuracy and completeness, and I  agree with the above. .Loanne Emery Kishore Shaylon Gillean MD

## 2023-07-06 ENCOUNTER — Encounter: Admitting: Physical Therapy

## 2023-07-16 ENCOUNTER — Telehealth: Payer: Self-pay | Admitting: Hematology

## 2023-07-16 NOTE — Telephone Encounter (Signed)
 Spoke with patient confirming upcoming appointment

## 2023-07-22 ENCOUNTER — Ambulatory Visit: Admitting: Rehabilitative and Restorative Service Providers"

## 2023-07-24 ENCOUNTER — Encounter: Admitting: Rehabilitative and Restorative Service Providers"

## 2023-07-27 ENCOUNTER — Encounter: Payer: Self-pay | Admitting: Physical Therapy

## 2023-07-27 ENCOUNTER — Ambulatory Visit: Attending: Gastroenterology | Admitting: Physical Therapy

## 2023-07-27 DIAGNOSIS — M6281 Muscle weakness (generalized): Secondary | ICD-10-CM | POA: Insufficient documentation

## 2023-07-27 DIAGNOSIS — R2689 Other abnormalities of gait and mobility: Secondary | ICD-10-CM | POA: Insufficient documentation

## 2023-07-27 DIAGNOSIS — R278 Other lack of coordination: Secondary | ICD-10-CM | POA: Insufficient documentation

## 2023-07-27 NOTE — Therapy (Signed)
 OUTPATIENT PHYSICAL THERAPY FEMALE PELVIC TREATMENT   Patient Name: Taylor Berry MRN: [id number] DOB:09/06/1940, 83 y.o., female Today's Date: 07/27/2023  END OF SESSION:  PT End of Session - 07/27/23 1456     Visit Number 8    Authorization Type [employer]    Authorization Time Period 07/26/2013    PT Start Time 1455    PT Stop Time 1533    PT Time Calculation (min) 38 min    Activity Tolerance Patient tolerated treatment well    Behavior During Therapy WFL for tasks assessed/performed               Past Medical History:  Diagnosis Date   Abnormal CT scan    Allergic rhinitis    Anemia 2015   after lung surgery   Angio-edema    Complication of anesthesia    Fentanyl  allergy, claustrophobia   COPD (chronic obstructive pulmonary disease) (HCC)    Depression    Diaphoresis 07/30/2015   Excessive sweating with little exertion   Difficult intravenous access    GERD (gastroesophageal reflux disease)    In the past   Hyperlipidemia    Hypertension    Left bundle branch block    Lung cancer (HCC) 2015   left upper lobe wedge removed-no further tx.   Neuropathy    Tingling in arms, fingers, and feet (Since 06/15/16)   Obesity    OSA (obstructive sleep apnea)    denies.   Osteoarthritis    ra also   Osteoporosis    Recurrent upper respiratory infection (URI)    Rheumatoid arthritis(714.0)    Dr. Mai   SOB (shortness of breath) on exertion    Uterine cancer (HCC) 1971   tx surgical   Past Surgical History:  Procedure Laterality Date   ABDOMINAL HYSTERECTOMY     in situ carcinoma partial   CARDIAC CATHETERIZATION  01/27/11   minor non-obs CAD, NL EF, mild pulm HTN   CATARACT EXTRACTION, BILATERAL     last done 12'16-   COLONOSCOPY W/ BIOPSIES AND POLYPECTOMY     COLONOSCOPY WITH PROPOFOL  N/A 02/13/2015   Procedure: COLONOSCOPY WITH PROPOFOL ;  Surgeon: Lynwood Bohr, MD;  Location: [facility];  Service: Endoscopy;  Laterality: N/A;    ESOPHAGOGASTRODUODENOSCOPY (EGD) WITH PROPOFOL  N/A 09/30/2016   Procedure: ESOPHAGOGASTRODUODENOSCOPY (EGD) WITH PROPOFOL ;  Surgeon: Bohr Lynwood, MD;  Location: [facility];  Service: Endoscopy;  Laterality: N/A;   LYMPH NODE DISSECTION Left 04/12/2013   Procedure: LYMPH NODE DISSECTION;  Surgeon: Dallas KATHEE Jude, MD;  Location: [facility] OR;  Service: Thoracic;  Laterality: Left;   RIGHT/LEFT HEART CATH AND CORONARY ANGIOGRAPHY N/A 10/08/2016   Procedure: RIGHT/LEFT HEART CATH AND CORONARY ANGIOGRAPHY;  Surgeon: Verlin Lonni BIRCH, MD;  Location: [facility];  Service: Cardiovascular;  Laterality: N/A;   TONSILLECTOMY     VESICOVAGINAL FISTULA CLOSURE W/ TAH  1971   VIDEO ASSISTED THORACOSCOPY (VATS)/THOROCOTOMY Left 04/12/2013   Procedure: VIDEO ASSISTED THORACOSCOPY (VATS)/THOROCOTOMY, WITH LEFT UPPER LOBE WEDGE RESECTION, CHEST WALL BIOPSY ;  Surgeon: Dallas KATHEE Jude, MD;  Location: [facility] OR;  Service: Thoracic;  Laterality: Left;   VIDEO BRONCHOSCOPY N/A 04/12/2013   Procedure: VIDEO BRONCHOSCOPY;  Surgeon: Dallas KATHEE Jude, MD;  Location: [facility] OR;  Service: Thoracic;  Laterality: N/A;   Patient Active Problem List   Diagnosis Date Noted   Impacted cerumen of right ear 01/17/2023   Dizziness 01/17/2023   Central perforation of tympanic membrane of right ear 01/17/2023  Other specified disorders of eustachian tube, right ear 01/17/2023   Hepatic lesion 12/20/2021   Chronic systolic heart failure (HCC)    Bradycardia 05/23/2021   Postural dizziness with presyncope 05/23/2021   Acute prerenal azotemia 05/23/2021   Cerebral thrombosis with cerebral infarction 01/02/2021   Encephalopathy acute/expressive aphasia  12/30/2020   Hyponatremia 12/30/2020   GERD (gastroesophageal reflux disease) 12/30/2020   Hypocalcemia 12/30/2020   Acute on chronic combined systolic and diastolic CHF (congestive heart failure) (HCC) 04/28/2019   non obstructive CAD 04/28/2019   Edema 04/14/2019    Abnormal CT of the chest 03/22/2018   Acid reflux 07/13/2017   Affective psychosis (HCC) 07/13/2017   Alcohol dependence in remission (HCC) 07/13/2017   BD (Bowen's disease) 07/13/2017   History of colon polyps 07/13/2017   Rheumatoid arthritis (HCC) 07/13/2017   Chronic bronchitis (HCC) 07/13/2017   Secondary pulmonary hypertension 07/13/2017   Obstructive apnea 07/13/2017   Age related osteoporosis 07/13/2017   Non-ischemic cardiomyopathy/diastolic and systolic CHF    Hyperhidrosis 93/86/7981   Anemia in neoplastic disease 05/02/2013   Allergic rhinitis 04/29/2013   Depression 04/29/2013   Lung cancer, left upper lobe 02/03/2013   Pulmonary infiltrates 01/05/2013   Mild pulmonary hypertension (HCC) 02/17/2011   OSA (obstructive sleep apnea) 02/17/2011   Dyspnea on exertion 11/22/2010   COPD (chronic obstructive pulmonary disease) (HCC) 07/20/2009   Carcinoma in situ of cervix uteri 06/26/2009   Pure hypercholesterolemia 06/26/2009   OBESITY 06/26/2009   Chronic disease anemia 06/26/2009   Essential (primary) hypertension 06/26/2009   ARTHRITIS, RHEUMATOID 06/26/2009   OSTEOPOROSIS 06/26/2009   Cough 06/26/2009    PCP: Leonel Cole, MD  REFERRING PROVIDER: Legrand Victory LITTIE DOUGLAS, MD   REFERRING DIAG: R15.9 (ICD-10-CM) - Incontinence of feces, unspecified fecal incontinence type   THERAPY DIAG:  Muscle weakness (generalized)  Other abnormalities of gait and mobility  Other lack of coordination  Rationale for Evaluation and Treatment: Rehabilitation  ONSET DATE: pt reports 5 years  SUBJECTIVE:                                                                                                                                                                                           SUBJECTIVE STATEMENT: Pt reports that she is doing pretty well, she is doing well with the exercises  when she is here ( in PT).  Feels improvement, can make it to bathroom, can make it to the  bathroom better.  Saw her GI dr, he was pleased with her progress.     Fluid intake: coffee, water  PAIN: no pain  PRECAUTIONS: None  RED FLAGS: None  WEIGHT BEARING RESTRICTIONS: No  FALLS:  Has patient fallen in last 6 months? No  OCCUPATION: retired  ACTIVITY LEVEL : not very active as far as exercise since her stroke, drives, goes to appts, goes to the grocery store, plays violin in 2 symphonies, is sure that she knows not being so active has something to do with this   PLOF: Independent with household mobility with device  PATIENT GOALS: to be well, to able to stand up and walk right and not have fecal incontinence, it is frustrating   PERTINENT HISTORY:  Hysterectomy, CHF, COPD Sexual abuse: No  BOWEL MOVEMENT: Sometimes it will happen when she walks around the grocery store, accidents are random, sometimes it just happens, sometimes when she eats something spicy, unpredictable Pain with bowel movement: No Type of bowel movement:Type (Bristol Stool Scale) 3 or 6, when it is 6 she might have an accident, he just changed her medication Fully empty rectum: No- sometimes she does not Leakage: Yes:   Pads: Yes: all the time Fiber supplement/laxative Yes Metamucil- 2 capsules/ day. Will start Immodium tonight  URINATION: has to get up in the night, when she lies down and sits up, she does not have the strength to hold it Pain with urination: No Fully empty bladder: Yes:   Stream: Strong Urgency: Yes  Frequency: when she is going from lying down to sitting up, she leaks, during the day, it's a little bit of leaks, her and there Leakage: Urge to void, Walking to the bathroom, Coughing, and Sneezing Pads: Yes: 2- 3 / day  INTERCOURSE: not active   PREGNANCY: Vaginal deliveries 2- breech and 1 premature, almost died during the delivery Tearing No Episiotomy Yes  C-section deliveries 0 Currently pregnant No  PROLAPSE: None   OBJECTIVE:  Note: Objective  measures were completed at Evaluation unless otherwise noted.   PATIENT SURVEYS:    PFIQ-7: 119  COGNITION: Overall cognitive status: Within functional limits for tasks assessed     SENSATION: Light touch: Appears intact  LUMBAR SPECIAL TESTS:  Single leg stance test: Positive- decreased balance and weakness  GAIT: Assistive device utilized: Single point cane Comments: slow, antalgic  POSTURE: rounded shoulders, forward head, decreased lumbar lordosis, and flexed trunk    LUMBARAROM/PROM: limited throughout  LOWER EXTREMITY MNF:opfpuzi throughout   LOWER EXTREMITY MMT: 4-/5 bilateral hips and knees  PALPATION:   General: difficulty with engaging abdomen and pelvic floor  Pelvic Alignment: even  Abdominal: restrictions throughout abdomen                External Perineal Exam: deferred                              Internal Pelvic Floor: deferred   Patient confirms identification and approves PT to assess internal pelvic floor and treatment No  PELVIC MMT:   MMT eval  Vaginal   Internal Anal Sphincter   External Anal Sphincter   Puborectalis   Diastasis Recti no  (Blank rows = not tested)        TONE: Deferred   PROLAPSE: Deferred   TODAY'S TREATMENT:  DATE: 07/27/2023 There ex- NU step  10 mins Leg press- 40 lbs There acts- review of fiber, squatty potty, healthy bowel recommendations     06/29/2023 Neuro reed- Nu step- Nu step- 8 mins- with therapist present to discuss progress Leg press- 40 lbs with transverse abdominis breath 30 reps Arm bike - 2 mins forward and 3 mins backwards Hip adduction with ball with transverse abdominis breath 20 reps    06/16/2023   Neuro reed- Nu step- Nu step- 14 mins- with therapist present to discuss progress                     [facility] press with transverse abdominis breath  Hip  adduction with ball with transverse abdominis breath  with thera band hold Ball press into table bilateral 10 reps       Neuro reed- ball press with pelvic floor lift, with transverse abdominis breath Hip adduction with ball  Sit to stand with transverse abdominis breath and pelvic floor contraction with red theraband holding Nustep 5:45 mins  Arm bike 2+2 mins Supine to sit with transverse abdominis breath and  PF contraction   PATIENT EDUCATION:  Education details: HEP, expectations of PT, exam findings Person educated: Patient Education method: Explanation, Demonstration, Tactile cues, Verbal cues, and Handouts Education comprehension: verbalized understanding, returned demonstration, verbal cues required, tactile cues required, and needs further education  HOME EXERCISE PROGRAM:  [id number]  ASSESSMENT:  CLINICAL IMPRESSION: Patient was seen today for treatment of fecal incontinence and urinary incontinence. Patient with limited endurance, core, global weakness and decreased mobility. Patient did well with exercises and education today. We discussed progress, HEP and recommended improved consistency with HEP. Treatment session focused on exercise to improve core strength and hip strengthening. Patient had minimal difficulty with nu step and leg press. She reported that she has some irritants that will give her diarrhea ( salsa and tomatoes) Imodium is helping, she is not constipated. Patient is planning a trip in October and is worried about being able to lift her suitcase and carry it up the stairs. She reports that she has concerning blood work. Patient is progressing slowly towards goals and will benefit from continued PT to address deficits, reduce fecal incontinence and improve quality of life.        OBJECTIVE IMPAIRMENTS: Abnormal gait, cardiopulmonary status limiting activity, decreased activity tolerance, decreased balance, decreased cognition, decreased coordination,  decreased mobility, difficulty walking, decreased ROM, decreased strength, hypomobility, impaired flexibility, impaired tone, postural dysfunction, and pain.   ACTIVITY LIMITATIONS: carrying, lifting, bending, sitting, standing, squatting, sleeping, stairs, transfers, continence, toileting, reach over head, locomotion level, and caring for others  PARTICIPATION LIMITATIONS: meal prep, cleaning, laundry, personal finances, driving, shopping, community activity, occupation, yard work, and church  PERSONAL FACTORS: Age, Fitness, Time since onset of injury/illness/exacerbation, and 3+ comorbidities: CHF, COPD, rheumatoid arthritis are also affecting patient's functional outcome.   REHAB POTENTIAL: Fair    CLINICAL DECISION MAKING: Evolving/moderate complexity  EVALUATION COMPLEXITY: Moderate   GOALS: Goals reviewed with patient? Yes  SHORT TERM GOALS: Target date: 06/01/2023    Pt will be independent with HEP.   Baseline: Goal status: progressing  2.  Pt will be able to stand at least 10 mins without increased hip, knee and back pain Baseline:  Goal status: progressing ( back hurts)  3.  Pt will report reducing fecal accidents to at most once/ week Baseline:  Goal status: met  4.  Pt will report reducing urinary leaking to  max  once/ week Baseline:  Goal status: not yet   LONG TERM GOALS: Target date: 07/27/2023 POC update 10/26/2023  Pt will be independent with advanced HEP.   Baseline:  Goal status: progressing  2.  Pt will be independent with use of squatty potty, relaxed toileting mechanics, and improved bowel movement techniques in order to increase ease of bowel movements and complete evacuation.   Baseline:  Goal status: partially met  3.  Pt will be able to functional actions such as transfer in bed from supine to sit  without leakage  Baseline:  Goal status: progressing  4.  Pt will have no fecal or urinary accidents/ week to be able to participate in community  activities Baseline:  Goal status: not yet   PLAN:  PT FREQUENCY: 1-2x/week  PT DURATION: 12 weeks  PLANNED INTERVENTIONS: 97110-Therapeutic exercises, 97530- Therapeutic activity, 97112- Neuromuscular re-education, 97535- Self Care, [id number]- Manual therapy, [phone]- Gait training, [phone]- Electrical stimulation (manual), Patient/Family education, Stair training, Taping, Dry Needling, Joint mobilization, Joint manipulation, Spinal manipulation, Spinal mobilization, Scar mobilization, Cryotherapy, Moist heat, and Biofeedback  PLAN FOR NEXT SESSION: cont exercises for strengthening   Srinidhi Landers, PT 07/27/2023, 3:00 PM

## 2023-07-29 ENCOUNTER — Encounter: Admitting: Rehabilitative and Restorative Service Providers"

## 2023-07-31 ENCOUNTER — Other Ambulatory Visit (HOSPITAL_COMMUNITY): Payer: Self-pay

## 2023-08-04 DIAGNOSIS — L821 Other seborrheic keratosis: Secondary | ICD-10-CM | POA: Diagnosis not present

## 2023-08-04 DIAGNOSIS — Z85828 Personal history of other malignant neoplasm of skin: Secondary | ICD-10-CM | POA: Diagnosis not present

## 2023-08-04 DIAGNOSIS — L570 Actinic keratosis: Secondary | ICD-10-CM | POA: Diagnosis not present

## 2023-08-04 DIAGNOSIS — D2261 Melanocytic nevi of right upper limb, including shoulder: Secondary | ICD-10-CM | POA: Diagnosis not present

## 2023-08-05 ENCOUNTER — Encounter: Admitting: Rehabilitative and Restorative Service Providers"

## 2023-08-06 ENCOUNTER — Ambulatory Visit: Payer: Self-pay | Admitting: Physical Therapy

## 2023-08-11 ENCOUNTER — Ambulatory Visit: Attending: Gastroenterology | Admitting: Physical Therapy

## 2023-08-11 ENCOUNTER — Encounter: Payer: Self-pay | Admitting: Physical Therapy

## 2023-08-11 DIAGNOSIS — R2689 Other abnormalities of gait and mobility: Secondary | ICD-10-CM | POA: Insufficient documentation

## 2023-08-11 DIAGNOSIS — R278 Other lack of coordination: Secondary | ICD-10-CM | POA: Insufficient documentation

## 2023-08-11 DIAGNOSIS — M6281 Muscle weakness (generalized): Secondary | ICD-10-CM | POA: Insufficient documentation

## 2023-08-11 NOTE — Therapy (Signed)
 OUTPATIENT PHYSICAL THERAPY FEMALE PELVIC TREATMENT   Patient Name: Taylor Berry MRN: [id number] DOB:1940-01-30, 83 y.o., female Today's Date: 08/11/2023  END OF SESSION:  PT End of Session - 08/11/23 1150     Visit Number 9    Date for PT Re-Evaluation 10/26/23    Authorization Type [employer]    Authorization Time Period med necessity    Progress Note Due on Visit 10    PT Start Time 1148    PT Stop Time 1229    PT Time Calculation (min) 41 min    Activity Tolerance Patient tolerated treatment well    Behavior During Therapy WFL for tasks assessed/performed                Past Medical History:  Diagnosis Date   Abnormal CT scan    Allergic rhinitis    Anemia 2015   after lung surgery   Angio-edema    Complication of anesthesia    Fentanyl  allergy, claustrophobia   COPD (chronic obstructive pulmonary disease) (HCC)    Depression    Diaphoresis 07/30/2015   Excessive sweating with little exertion   Difficult intravenous access    GERD (gastroesophageal reflux disease)    In the past   Hyperlipidemia    Hypertension    Left bundle branch block    Lung cancer (HCC) 2015   left upper lobe wedge removed-no further tx.   Neuropathy    Tingling in arms, fingers, and feet (Since 06/15/16)   Obesity    OSA (obstructive sleep apnea)    denies.   Osteoarthritis    ra also   Osteoporosis    Recurrent upper respiratory infection (URI)    Rheumatoid arthritis(714.0)    Dr. Mai   SOB (shortness of breath) on exertion    Uterine cancer (HCC) 1971   tx surgical   Past Surgical History:  Procedure Laterality Date   ABDOMINAL HYSTERECTOMY     in situ carcinoma partial   CARDIAC CATHETERIZATION  01/27/11   minor non-obs CAD, NL EF, mild pulm HTN   CATARACT EXTRACTION, BILATERAL     last done 12'16-   COLONOSCOPY W/ BIOPSIES AND POLYPECTOMY     COLONOSCOPY WITH PROPOFOL  N/A 02/13/2015   Procedure: COLONOSCOPY WITH PROPOFOL ;  Surgeon: Lynwood Bohr, MD;  Location: [facility];  Service: Endoscopy;  Laterality: N/A;   ESOPHAGOGASTRODUODENOSCOPY (EGD) WITH PROPOFOL  N/A 09/30/2016   Procedure: ESOPHAGOGASTRODUODENOSCOPY (EGD) WITH PROPOFOL ;  Surgeon: Bohr Lynwood, MD;  Location: [facility];  Service: Endoscopy;  Laterality: N/A;   LYMPH NODE DISSECTION Left 04/12/2013   Procedure: LYMPH NODE DISSECTION;  Surgeon: Dallas KATHEE Jude, MD;  Location: [facility] OR;  Service: Thoracic;  Laterality: Left;   RIGHT/LEFT HEART CATH AND CORONARY ANGIOGRAPHY N/A 10/08/2016   Procedure: RIGHT/LEFT HEART CATH AND CORONARY ANGIOGRAPHY;  Surgeon: Verlin Lonni BIRCH, MD;  Location: [facility];  Service: Cardiovascular;  Laterality: N/A;   TONSILLECTOMY     VESICOVAGINAL FISTULA CLOSURE W/ TAH  1971   VIDEO ASSISTED THORACOSCOPY (VATS)/THOROCOTOMY Left 04/12/2013   Procedure: VIDEO ASSISTED THORACOSCOPY (VATS)/THOROCOTOMY, WITH LEFT UPPER LOBE WEDGE RESECTION, CHEST WALL BIOPSY ;  Surgeon: Dallas KATHEE Jude, MD;  Location: [facility] OR;  Service: Thoracic;  Laterality: Left;   VIDEO BRONCHOSCOPY N/A 04/12/2013   Procedure: VIDEO BRONCHOSCOPY;  Surgeon: Dallas KATHEE Jude, MD;  Location: [facility] OR;  Service: Thoracic;  Laterality: N/A;   Patient Active Problem List   Diagnosis Date Noted   Impacted cerumen  of right ear 01/17/2023   Dizziness 01/17/2023   Central perforation of tympanic membrane of right ear 01/17/2023   Other specified disorders of eustachian tube, right ear 01/17/2023   Hepatic lesion 12/20/2021   Chronic systolic heart failure (HCC)    Bradycardia 05/23/2021   Postural dizziness with presyncope 05/23/2021   Acute prerenal azotemia 05/23/2021   Cerebral thrombosis with cerebral infarction 01/02/2021   Encephalopathy acute/expressive aphasia  12/30/2020   Hyponatremia 12/30/2020   GERD (gastroesophageal reflux disease) 12/30/2020   Hypocalcemia 12/30/2020   Acute on chronic combined systolic and diastolic CHF (congestive heart failure)  (HCC) 04/28/2019   non obstructive CAD 04/28/2019   Edema 04/14/2019   Abnormal CT of the chest 03/22/2018   Acid reflux 07/13/2017   Affective psychosis (HCC) 07/13/2017   Alcohol dependence in remission (HCC) 07/13/2017   BD (Bowen's disease) 07/13/2017   History of colon polyps 07/13/2017   Rheumatoid arthritis (HCC) 07/13/2017   Chronic bronchitis (HCC) 07/13/2017   Secondary pulmonary hypertension 07/13/2017   Obstructive apnea 07/13/2017   Age related osteoporosis 07/13/2017   Non-ischemic cardiomyopathy/diastolic and systolic CHF    Hyperhidrosis 93/86/7981   Anemia in neoplastic disease 05/02/2013   Allergic rhinitis 04/29/2013   Depression 04/29/2013   Lung cancer, left upper lobe 02/03/2013   Pulmonary infiltrates 01/05/2013   Mild pulmonary hypertension (HCC) 02/17/2011   OSA (obstructive sleep apnea) 02/17/2011   Dyspnea on exertion 11/22/2010   COPD (chronic obstructive pulmonary disease) (HCC) 07/20/2009   Carcinoma in situ of cervix uteri 06/26/2009   Pure hypercholesterolemia 06/26/2009   OBESITY 06/26/2009   Chronic disease anemia 06/26/2009   Essential (primary) hypertension 06/26/2009   ARTHRITIS, RHEUMATOID 06/26/2009   OSTEOPOROSIS 06/26/2009   Cough 06/26/2009    PCP: Leonel Cole, MD  REFERRING PROVIDER: Legrand Victory LITTIE DOUGLAS, MD   REFERRING DIAG: R15.9 (ICD-10-CM) - Incontinence of feces, unspecified fecal incontinence type   THERAPY DIAG:  Muscle weakness (generalized)  Other lack of coordination  Other abnormalities of gait and mobility  Rationale for Evaluation and Treatment: Rehabilitation  ONSET DATE: pt reports 5 years  SUBJECTIVE:                                                                                                                                                                                           SUBJECTIVE STATEMENT: Patient reports that she had diarrhea once last week when she made and ate pesto- she was not sure if  it was the basil or the pine nuts She reports consistency with some exercises She is able to make it to the bathroom other times,  Has had  increased hand arm weakness Spicy food gives her diarrhea, told her son to make her food bland  Patient reports that she might have a gluten sensitivity and maybe a dairy sensitivity.   Fluid intake: coffee, water  PAIN: no pain  PRECAUTIONS: None  RED FLAGS: None   WEIGHT BEARING RESTRICTIONS: No  FALLS:  Has patient fallen in last 6 months? No  OCCUPATION: retired  ACTIVITY LEVEL : not very active as far as exercise since her stroke, drives, goes to appts, goes to the grocery store, plays violin in 2 symphonies, is sure that she knows not being so active has something to do with this   PLOF: Independent with household mobility with device  PATIENT GOALS: to be well, to able to stand up and walk right and not have fecal incontinence, it is frustrating   PERTINENT HISTORY:  Hysterectomy, CHF, COPD Sexual abuse: No  BOWEL MOVEMENT: Sometimes it will happen when she walks around the grocery store, accidents are random, sometimes it just happens, sometimes when she eats something spicy, unpredictable Pain with bowel movement: No Type of bowel movement:Type (Bristol Stool Scale) 3 or 6, when it is 6 she might have an accident, he just changed her medication Fully empty rectum: No- sometimes she does not Leakage: Yes:   Pads: Yes: all the time Fiber supplement/laxative Yes Metamucil- 2 capsules/ day. Will start Immodium tonight  URINATION: has to get up in the night, when she lies down and sits up, she does not have the strength to hold it Pain with urination: No Fully empty bladder: Yes:   Stream: Strong Urgency: Yes  Frequency: when she is going from lying down to sitting up, she leaks, during the day, it's a little bit of leaks, her and there Leakage: Urge to void, Walking to the bathroom, Coughing, and Sneezing Pads: Yes: 2- 3 /  day  INTERCOURSE: not active   PREGNANCY: Vaginal deliveries 2- breech and 1 premature, almost died during the delivery Tearing No Episiotomy Yes  C-section deliveries 0 Currently pregnant No  PROLAPSE: None   OBJECTIVE:  Note: Objective measures were completed at Evaluation unless otherwise noted.   PATIENT SURVEYS:    PFIQ-7: 119  COGNITION: Overall cognitive status: Within functional limits for tasks assessed     SENSATION: Light touch: Appears intact  LUMBAR SPECIAL TESTS:  Single leg stance test: Positive- decreased balance and weakness  GAIT: Assistive device utilized: Single point cane Comments: slow, antalgic  POSTURE: rounded shoulders, forward head, decreased lumbar lordosis, and flexed trunk    LUMBARAROM/PROM: limited throughout  LOWER EXTREMITY MNF:opfpuzi throughout   LOWER EXTREMITY MMT: 4-/5 bilateral hips and knees  PALPATION:   General: difficulty with engaging abdomen and pelvic floor  Pelvic Alignment: even  Abdominal: restrictions throughout abdomen                External Perineal Exam: deferred                              Internal Pelvic Floor: deferred   Patient confirms identification and approves PT to assess internal pelvic floor and treatment No  PELVIC MMT:   MMT eval  Vaginal   Internal Anal Sphincter   External Anal Sphincter   Puborectalis   Diastasis Recti no  (Blank rows = not tested)        TONE: Deferred   PROLAPSE: Deferred   TODAY'S TREATMENT:  DATE: 08/11/2023 Nu step 12 mins for global strengthening There acts- review of bowel irritants Transverse abdominis breath with chest press 20 reps #2 #1 lb pronation/ supination bilateral 20 rep with diaphragmatic breathing  Seated ball press with transverse abdominis breath 10 reps for pelvic floor and abdominal  activation     07/27/2023 There ex- NU step  10 mins Leg press- 40 lbs There acts- review of fiber, squatty potty, healthy bowel recommendations     06/29/2023 Neuro reed- Nu step- Nu step- 8 mins- with therapist present to discuss progress Leg press- 40 lbs with transverse abdominis breath 30 reps Arm bike - 2 mins forward and 3 mins backwards Hip adduction with ball with transverse abdominis breath 20 reps    06/16/2023   Neuro reed- Nu step- Nu step- 14 mins- with therapist present to discuss progress                     [employer] press with transverse abdominis breath  Hip adduction with ball with transverse abdominis breath  with thera band hold Ball press into table bilateral 10 reps       Neuro reed- ball press with pelvic floor lift, with transverse abdominis breath Hip adduction with ball  Sit to stand with transverse abdominis breath and pelvic floor contraction with red theraband holding Nustep 5:45 mins  Arm bike 2+2 mins Supine to sit with transverse abdominis breath and  PF contraction   PATIENT EDUCATION:  Education details: HEP, expectations of PT, exam findings Person educated: Patient Education method: Explanation, Demonstration, Tactile cues, Verbal cues, and Handouts Education comprehension: verbalized understanding, returned demonstration, verbal cues required, tactile cues required, and needs further education  HOME EXERCISE PROGRAM:  [id number]  ASSESSMENT:  CLINICAL IMPRESSION: Patient was seen today for treatment of fecal incontinence and urinary incontinence. Patient with limited endurance, core, global weakness and decreased mobility. Patient did well with exercises and education today. We discussed progress, HEP and recommended improved consistency with HEP. Treatment session focused on exercise to improve core strength and hip strengthening. Patient had minimal difficulty with nu step. She reported that she has some irritants that will give her  diarrhea ( salsa, basil, nuts  and tomatoes) Imodium is helping, she is not constipated. Patient is planning a trip in October and is worried about being able to lift her suitcase and carry it up the stairs. She reports that she has concerning blood work. Patient is progressing slowly towards goals and will benefit from continued PT to address deficits, reduce fecal incontinence and improve quality of life.  Limited by her COPD and difficulty with breathing and weakness.       OBJECTIVE IMPAIRMENTS: Abnormal gait, cardiopulmonary status limiting activity, decreased activity tolerance, decreased balance, decreased cognition, decreased coordination, decreased mobility, difficulty walking, decreased ROM, decreased strength, hypomobility, impaired flexibility, impaired tone, postural dysfunction, and pain.   ACTIVITY LIMITATIONS: carrying, lifting, bending, sitting, standing, squatting, sleeping, stairs, transfers, continence, toileting, reach over head, locomotion level, and caring for others  PARTICIPATION LIMITATIONS: meal prep, cleaning, laundry, personal finances, driving, shopping, community activity, occupation, yard work, and church  PERSONAL FACTORS: Age, Fitness, Time since onset of injury/illness/exacerbation, and 3+ comorbidities: CHF, COPD, rheumatoid arthritis are also affecting patient's functional outcome.   REHAB POTENTIAL: Fair    CLINICAL DECISION MAKING: Evolving/moderate complexity  EVALUATION COMPLEXITY: Moderate   GOALS: Goals reviewed with patient? Yes  SHORT TERM GOALS: Target date: 06/01/2023    Pt will be independent with HEP.  Baseline: Goal status: progressing  2.  Pt will be able to stand at least 10 mins without increased hip, knee and back pain Baseline:  Goal status: progressing ( back hurts)  3.  Pt will report reducing fecal accidents to at most once/ week Baseline:  Goal status: met  4.  Pt will report reducing urinary leaking to max  once/  week Baseline:  Goal status: not yet   LONG TERM GOALS: Target date: 07/27/2023 POC update 10/26/2023  Pt will be independent with advanced HEP.   Baseline:  Goal status: progressing  2.  Pt will be independent with use of squatty potty, relaxed toileting mechanics, and improved bowel movement techniques in order to increase ease of bowel movements and complete evacuation.   Baseline:  Goal status: partially met  3.  Pt will be able to functional actions such as transfer in bed from supine to sit  without leakage  Baseline:  Goal status: progressing  4.  Pt will have no fecal or urinary accidents/ week to be able to participate in community activities Baseline:  Goal status: not yet   PLAN:  PT FREQUENCY: 1-2x/week  PT DURATION: 12 weeks  PLANNED INTERVENTIONS: 97110-Therapeutic exercises, 97530- Therapeutic activity, 97112- Neuromuscular re-education, 97535- Self Care, [id number]- Manual therapy, [phone]- Gait training, [phone]- Electrical stimulation (manual), Patient/Family education, Stair training, Taping, Dry Needling, Joint mobilization, Joint manipulation, Spinal manipulation, Spinal mobilization, Scar mobilization, Cryotherapy, Moist heat, and Biofeedback  PLAN FOR NEXT SESSION: cont exercises for strengthening   Edina Winningham, PT 08/11/2023, 12:29 PM

## 2023-08-11 NOTE — Patient Instructions (Signed)
 Healthy Bowel PT recommendations   Choose the best time of day to have a bowel movement:  Usually the best time of day for a bowel movement will be a half hour to an hour after breakfast.  For some people, a half hour to an hour after lunch will work better.  These times are best because the body uses the gastrocolic reflex, a stimulation of bowel motion that occurs with eating, to help produce a bowel movement.  Make sure that you are not rushed and have convenient access to a bathroom at your selected time.  Eat all your meals at a predictable time each day.  The bowel functions best when food is introduced at the same regular intervals.  The amount of food eaten at a given time of day should be about the same size from day to day.  The bowel functions best when food is introduced in similar quantities from day to day.  It is fine to have a small breakfast and a large lunch, or vice versa, just be consistent.  Eat two servings of fruit or vegetables and at least one serving of complex carbohydrates (whole grains such as brown rice, bran, whole wheat bread, or oatmeal) at each meal.   A serving of fruit or vegetables is a half-cup or medium-sized piece of fruit.  A serving of a complex carbohydrate is a half-cup or a slice of bread.  It is often desirable to eat more than the recommended minimum amounts of fruits, vegetables, and complex carbohydrates.  Drink plenty of water- half of your weight in ounces/ day.   Until regular bowel movements are established at a desired time of day, take 2-3 dried prunes (or  to 1/3 cup of prune juice) each night to stimulate morning bowel function.  Exercise daily.  You may exercise at any time of day, but you may find that bowel function is helped most if the exercise is at a consistent time each day.

## 2023-08-13 ENCOUNTER — Other Ambulatory Visit (HOSPITAL_COMMUNITY): Payer: Self-pay

## 2023-08-13 ENCOUNTER — Telehealth: Payer: Self-pay | Admitting: Allergy and Immunology

## 2023-08-13 ENCOUNTER — Telehealth: Payer: Self-pay

## 2023-08-13 ENCOUNTER — Other Ambulatory Visit: Payer: Self-pay

## 2023-08-13 DIAGNOSIS — R21 Rash and other nonspecific skin eruption: Secondary | ICD-10-CM | POA: Diagnosis not present

## 2023-08-13 MED ORDER — TRELEGY ELLIPTA 100-62.5-25 MCG/ACT IN AEPB: INHALATION_SPRAY | RESPIRATORY_TRACT | Status: DC

## 2023-08-13 MED ORDER — TRIAMCINOLONE ACETONIDE 0.1 % EX OINT
1.0000 | TOPICAL_OINTMENT | Freq: Two times a day (BID) | CUTANEOUS | 0 refills | Status: DC
  Filled 2023-08-13 (×2): qty 30, 15d supply, fill #0

## 2023-08-13 MED ORDER — METHYLPREDNISOLONE 4 MG PO TBPK
ORAL_TABLET | ORAL | 0 refills | Status: AC
  Filled 2023-08-13 (×2): qty 21, 6d supply, fill #0

## 2023-08-13 NOTE — Telephone Encounter (Signed)
 Pt  request a call back she is  requesting samples of trelogy 100

## 2023-08-13 NOTE — Telephone Encounter (Signed)
 Copied from CRM [id number]. Topic: Clinical - Medication Question >> Aug 13, 2023  1:27 PM Dustin F wrote: Reason for CRM: Pt is requesting samples of the Fluticasone -Umeclidin-Vilant (TRELEGY ELLIPTA ) 100-62.5-25 MCG/ACT AEPB so she can pick them up from the front desk. Pt's phone number is [phone] ok to leave a vm.  Spoke w/ pt will provide with samples , advise to fill out patient assistant FORMS THAT ARE ATTACHED TO BAG OF SAMPLES.   NFN-

## 2023-08-14 ENCOUNTER — Telehealth: Payer: Self-pay | Admitting: Allergy and Immunology

## 2023-08-14 NOTE — Telephone Encounter (Signed)
 PT called back to get samples of Trelegy from Dr Maurilio, I advised saw note with Vernell yesterday but did not see any other notes about getting samples. Advised that her PCP appeared to have gotten samples of Trelegy yesterday, and saw a Rx from Kozlow from June. She stated that PT/Kozlow had an understanding and requested him, I advised he was out today and would be back in [place] on Monday or [place] on Tuesday. She hung up before anything else discussed

## 2023-08-20 DIAGNOSIS — R21 Rash and other nonspecific skin eruption: Secondary | ICD-10-CM | POA: Diagnosis not present

## 2023-08-21 ENCOUNTER — Encounter: Admitting: Physical Therapy

## 2023-08-21 NOTE — Telephone Encounter (Signed)
 Samples have been placed on the shot room side for patient pick up and note has been completed in a different encounter.

## 2023-08-21 NOTE — Telephone Encounter (Signed)
 Per Dr. Maurilio I got the trelegy 100 samples ready for patient pick. Patient states that the copay for this medication is $45 and because she can't afford it she receives samples from the [place] office and this has been approved by Dr. Kozlow.  ( I signed out 3 samples on 08/21/23)

## 2023-08-24 ENCOUNTER — Other Ambulatory Visit (HOSPITAL_COMMUNITY): Payer: Self-pay

## 2023-08-25 DIAGNOSIS — M1991 Primary osteoarthritis, unspecified site: Secondary | ICD-10-CM | POA: Diagnosis not present

## 2023-08-25 DIAGNOSIS — M810 Age-related osteoporosis without current pathological fracture: Secondary | ICD-10-CM | POA: Diagnosis not present

## 2023-08-25 DIAGNOSIS — E663 Overweight: Secondary | ICD-10-CM | POA: Diagnosis not present

## 2023-08-25 DIAGNOSIS — D72818 Other decreased white blood cell count: Secondary | ICD-10-CM | POA: Diagnosis not present

## 2023-08-25 DIAGNOSIS — R531 Weakness: Secondary | ICD-10-CM | POA: Diagnosis not present

## 2023-08-25 DIAGNOSIS — M0589 Other rheumatoid arthritis with rheumatoid factor of multiple sites: Secondary | ICD-10-CM | POA: Diagnosis not present

## 2023-08-25 DIAGNOSIS — M06322 Rheumatoid nodule, left elbow: Secondary | ICD-10-CM | POA: Diagnosis not present

## 2023-08-25 DIAGNOSIS — R682 Dry mouth, unspecified: Secondary | ICD-10-CM | POA: Diagnosis not present

## 2023-08-25 DIAGNOSIS — Z79899 Other long term (current) drug therapy: Secondary | ICD-10-CM | POA: Diagnosis not present

## 2023-08-25 DIAGNOSIS — Z6826 Body mass index (BMI) 26.0-26.9, adult: Secondary | ICD-10-CM | POA: Diagnosis not present

## 2023-08-26 ENCOUNTER — Other Ambulatory Visit (HOSPITAL_COMMUNITY): Payer: Self-pay

## 2023-08-26 ENCOUNTER — Other Ambulatory Visit: Payer: Self-pay

## 2023-08-26 MED ORDER — LEFLUNOMIDE 10 MG PO TABS
10.0000 mg | ORAL_TABLET | Freq: Every day | ORAL | 1 refills | Status: DC
  Filled 2023-08-26: qty 90, 90d supply, fill #0

## 2023-08-28 ENCOUNTER — Ambulatory Visit: Admitting: Physical Therapy

## 2023-08-28 ENCOUNTER — Encounter: Payer: Self-pay | Admitting: Physical Therapy

## 2023-08-28 DIAGNOSIS — M6281 Muscle weakness (generalized): Secondary | ICD-10-CM

## 2023-08-28 DIAGNOSIS — R2689 Other abnormalities of gait and mobility: Secondary | ICD-10-CM

## 2023-08-28 DIAGNOSIS — R278 Other lack of coordination: Secondary | ICD-10-CM

## 2023-08-28 NOTE — Therapy (Signed)
 OUTPATIENT PHYSICAL THERAPY FEMALE PELVIC TREATMENT   Patient Name: Taylor Berry MRN: [id number] DOB:01-11-1940, 83 y.o., female Today's Date: 08/28/2023  END OF SESSION:  PT End of Session - 08/28/23 1023     Visit Number 10    Date for PT Re-Evaluation 10/26/23    Authorization Type [employer]    Authorization Time Period med necessity    Progress Note Due on Visit 10    PT Start Time 1023    PT Stop Time 1105    PT Time Calculation (min) 42 min    Activity Tolerance Patient tolerated treatment well    Behavior During Therapy WFL for tasks assessed/performed                Past Medical History:  Diagnosis Date   Abnormal CT scan    Allergic rhinitis    Anemia 2015   after lung surgery   Angio-edema    Complication of anesthesia    Fentanyl  allergy, claustrophobia   COPD (chronic obstructive pulmonary disease) (HCC)    Depression    Diaphoresis 07/30/2015   Excessive sweating with little exertion   Difficult intravenous access    GERD (gastroesophageal reflux disease)    In the past   Hyperlipidemia    Hypertension    Left bundle branch block    Lung cancer (HCC) 2015   left upper lobe wedge removed-no further tx.   Neuropathy    Tingling in arms, fingers, and feet (Since 06/15/16)   Obesity    OSA (obstructive sleep apnea)    denies.   Osteoarthritis    ra also   Osteoporosis    Recurrent upper respiratory infection (URI)    Rheumatoid arthritis(714.0)    Dr. Mai   SOB (shortness of breath) on exertion    Uterine cancer (HCC) 1971   tx surgical   Past Surgical History:  Procedure Laterality Date   ABDOMINAL HYSTERECTOMY     in situ carcinoma partial   CARDIAC CATHETERIZATION  01/27/11   minor non-obs CAD, NL EF, mild pulm HTN   CATARACT EXTRACTION, BILATERAL     last done 12'16-   COLONOSCOPY W/ BIOPSIES AND POLYPECTOMY     COLONOSCOPY WITH PROPOFOL  N/A 02/13/2015   Procedure: COLONOSCOPY WITH PROPOFOL ;  Surgeon: Lynwood Bohr, MD;  Location: [facility];  Service: Endoscopy;  Laterality: N/A;   ESOPHAGOGASTRODUODENOSCOPY (EGD) WITH PROPOFOL  N/A 09/30/2016   Procedure: ESOPHAGOGASTRODUODENOSCOPY (EGD) WITH PROPOFOL ;  Surgeon: Bohr Lynwood, MD;  Location: [facility];  Service: Endoscopy;  Laterality: N/A;   LYMPH NODE DISSECTION Left 04/12/2013   Procedure: LYMPH NODE DISSECTION;  Surgeon: Dallas KATHEE Jude, MD;  Location: [facility] OR;  Service: Thoracic;  Laterality: Left;   RIGHT/LEFT HEART CATH AND CORONARY ANGIOGRAPHY N/A 10/08/2016   Procedure: RIGHT/LEFT HEART CATH AND CORONARY ANGIOGRAPHY;  Surgeon: Verlin Lonni BIRCH, MD;  Location: [facility];  Service: Cardiovascular;  Laterality: N/A;   TONSILLECTOMY     VESICOVAGINAL FISTULA CLOSURE W/ TAH  1971   VIDEO ASSISTED THORACOSCOPY (VATS)/THOROCOTOMY Left 04/12/2013   Procedure: VIDEO ASSISTED THORACOSCOPY (VATS)/THOROCOTOMY, WITH LEFT UPPER LOBE WEDGE RESECTION, CHEST WALL BIOPSY ;  Surgeon: Dallas KATHEE Jude, MD;  Location: [facility] OR;  Service: Thoracic;  Laterality: Left;   VIDEO BRONCHOSCOPY N/A 04/12/2013   Procedure: VIDEO BRONCHOSCOPY;  Surgeon: Dallas KATHEE Jude, MD;  Location: [facility] OR;  Service: Thoracic;  Laterality: N/A;   Patient Active Problem List   Diagnosis Date Noted   Impacted cerumen  of right ear 01/17/2023   Dizziness 01/17/2023   Central perforation of tympanic membrane of right ear 01/17/2023   Other specified disorders of eustachian tube, right ear 01/17/2023   Hepatic lesion 12/20/2021   Chronic systolic heart failure (HCC)    Bradycardia 05/23/2021   Postural dizziness with presyncope 05/23/2021   Acute prerenal azotemia 05/23/2021   Cerebral thrombosis with cerebral infarction 01/02/2021   Encephalopathy acute/expressive aphasia  12/30/2020   Hyponatremia 12/30/2020   GERD (gastroesophageal reflux disease) 12/30/2020   Hypocalcemia 12/30/2020   Acute on chronic combined systolic and diastolic CHF (congestive heart failure)  (HCC) 04/28/2019   non obstructive CAD 04/28/2019   Edema 04/14/2019   Abnormal CT of the chest 03/22/2018   Acid reflux 07/13/2017   Affective psychosis (HCC) 07/13/2017   Alcohol dependence in remission (HCC) 07/13/2017   BD (Bowen's disease) 07/13/2017   History of colon polyps 07/13/2017   Rheumatoid arthritis (HCC) 07/13/2017   Chronic bronchitis (HCC) 07/13/2017   Secondary pulmonary hypertension 07/13/2017   Obstructive apnea 07/13/2017   Age related osteoporosis 07/13/2017   Non-ischemic cardiomyopathy/diastolic and systolic CHF    Hyperhidrosis 93/86/7981   Anemia in neoplastic disease 05/02/2013   Allergic rhinitis 04/29/2013   Depression 04/29/2013   Lung cancer, left upper lobe 02/03/2013   Pulmonary infiltrates 01/05/2013   Mild pulmonary hypertension (HCC) 02/17/2011   OSA (obstructive sleep apnea) 02/17/2011   Dyspnea on exertion 11/22/2010   COPD (chronic obstructive pulmonary disease) (HCC) 07/20/2009   Carcinoma in situ of cervix uteri 06/26/2009   Pure hypercholesterolemia 06/26/2009   OBESITY 06/26/2009   Chronic disease anemia 06/26/2009   Essential (primary) hypertension 06/26/2009   ARTHRITIS, RHEUMATOID 06/26/2009   OSTEOPOROSIS 06/26/2009   Cough 06/26/2009   Progress Note Reporting Period 05/04/2023 to 08/28/2023  See note below for Objective Data and Assessment of Progress/Goals.      PCP: Leonel Cole, MD  REFERRING PROVIDER: Legrand Victory LITTIE DOUGLAS, MD   REFERRING DIAG: R15.9 (ICD-10-CM) - Incontinence of feces, unspecified fecal incontinence type   THERAPY DIAG:  Muscle weakness (generalized)  Other abnormalities of gait and mobility  Other lack of coordination  Rationale for Evaluation and Treatment: Rehabilitation  ONSET DATE: pt reports 5 years  SUBJECTIVE:                                                                                                                                                                                            SUBJECTIVE STATEMENT: Patient reports that she did a lot of standing the other day and when she tried to get up from a church pew at a funeral  she had  a fecal incontinence episode. She was holding a hymnal in one hand. Had to pull up with the other hand. Nothing she ate this time.       Patient reports that she had diarrhea once last week when she made and ate pesto- she was not sure if it was the basil or the pine nuts She reports consistency with some exercises She is able to make it to the bathroom other times,  Has had increased hand arm weakness Spicy food gives her diarrhea, told her son to make her food bland  Patient reports that she might have a gluten sensitivity and maybe a dairy sensitivity.   Fluid intake: coffee, water  PAIN: no pain  PRECAUTIONS: None  RED FLAGS: None   WEIGHT BEARING RESTRICTIONS: No  FALLS:  Has patient fallen in last 6 months? No  OCCUPATION: retired  ACTIVITY LEVEL : not very active as far as exercise since her stroke, drives, goes to appts, goes to the grocery store, plays violin in 2 symphonies, is sure that she knows not being so active has something to do with this   PLOF: Independent with household mobility with device  PATIENT GOALS: to be well, to able to stand up and walk right and not have fecal incontinence, it is frustrating   PERTINENT HISTORY:  Hysterectomy, CHF, COPD Sexual abuse: No  BOWEL MOVEMENT: Sometimes it will happen when she walks around the grocery store, accidents are random, sometimes it just happens, sometimes when she eats something spicy, unpredictable Pain with bowel movement: No Type of bowel movement:Type (Bristol Stool Scale) 3 or 6, when it is 6 she might have an accident, he just changed her medication Fully empty rectum: No- sometimes she does not Leakage: Yes:   Pads: Yes: all the time Fiber supplement/laxative Yes Metamucil- 2 capsules/ day. Will start Immodium tonight  URINATION: has  to get up in the night, when she lies down and sits up, she does not have the strength to hold it Pain with urination: No Fully empty bladder: Yes:   Stream: Strong Urgency: Yes  Frequency: when she is going from lying down to sitting up, she leaks, during the day, it's a little bit of leaks, her and there Leakage: Urge to void, Walking to the bathroom, Coughing, and Sneezing Pads: Yes: 2- 3 / day  INTERCOURSE: not active   PREGNANCY: Vaginal deliveries 2- breech and 1 premature, almost died during the delivery Tearing No Episiotomy Yes  C-section deliveries 0 Currently pregnant No  PROLAPSE: None   OBJECTIVE:  Note: Objective measures were completed at Evaluation unless otherwise noted.   PATIENT SURVEYS:    PFIQ-7: 119  COGNITION: Overall cognitive status: Within functional limits for tasks assessed     SENSATION: Light touch: Appears intact  LUMBAR SPECIAL TESTS:  Single leg stance test: Positive- decreased balance and weakness  GAIT: Assistive device utilized: Single point cane Comments: slow, antalgic  POSTURE: rounded shoulders, forward head, decreased lumbar lordosis, and flexed trunk    LUMBARAROM/PROM: limited throughout  LOWER EXTREMITY MNF:opfpuzi throughout   LOWER EXTREMITY MMT: 4-/5 bilateral hips and knees  PALPATION:   General: difficulty with engaging abdomen and pelvic floor  Pelvic Alignment: even  Abdominal: restrictions throughout abdomen                External Perineal Exam: deferred  Internal Pelvic Floor: deferred   Patient confirms identification and approves PT to assess internal pelvic floor and treatment No  PELVIC MMT:   MMT eval  Vaginal   Internal Anal Sphincter   External Anal Sphincter   Puborectalis   Diastasis Recti no  (Blank rows = not tested)        TONE: Deferred   PROLAPSE: Deferred   TODAY'S TREATMENT:                                                                                                                               DATE: 08/28/2023 Nu step 10 mins with therapist present to discuss progress STS with #2 15 reps  Seated rowing red theraband with transverse abdominis breath 2x10 Seated ball press with transverse abdominis breath  15 reps    08/11/2023 Nu step 12 mins for global strengthening There acts- review of bowel irritants Transverse abdominis breath with chest press 20 reps #2 #1 lb pronation/ supination bilateral 20 rep with diaphragmatic breathing  Seated ball press with transverse abdominis breath 10 reps for pelvic floor and abdominal activation     07/27/2023 There ex- NU step  10 mins Leg press- 40 lbs There acts- review of fiber, squatty potty, healthy bowel recommendations     06/29/2023 Neuro reed- Nu step- Nu step- 8 mins- with therapist present to discuss progress Leg press- 40 lbs with transverse abdominis breath 30 reps Arm bike - 2 mins forward and 3 mins backwards Hip adduction with ball with transverse abdominis breath 20 reps    06/16/2023   Neuro reed- Nu step- Nu step- 14 mins- with therapist present to discuss progress                     [employer] with transverse abdominis breath  Hip adduction with ball with transverse abdominis breath  with thera band hold Ball press into table bilateral 10 reps       Neuro reed- ball press with pelvic floor lift, with transverse abdominis breath Hip adduction with ball  Sit to stand with transverse abdominis breath and pelvic floor contraction with red theraband holding Nustep 5:45 mins  Arm bike 2+2 mins Supine to sit with transverse abdominis breath and  PF contraction   PATIENT EDUCATION:  Education details: HEP, expectations of PT, exam findings Person educated: Patient Education method: Explanation, Demonstration, Tactile cues, Verbal cues, and Handouts Education comprehension: verbalized understanding, returned demonstration, verbal cues  required, tactile cues required, and needs further education  HOME EXERCISE PROGRAM:  [id number]  ASSESSMENT:  CLINICAL IMPRESSION: Patient was seen today for treatment of fecal incontinence and urinary incontinence. Patient with limited endurance, core, global weakness and decreased mobility. Patient did well with exercises today. We discussed progress, HEP and recommended improved consistency with HEP. Treatment session focused on exercise to improve core strength and hip strengthening. Patient had minimal difficulty with nu step. She reported that she has  some irritants that will give her diarrhea ( salsa, basil, nuts  and tomatoes) Imodium is helping, she is not constipated, takes 1/2 pill / day. Reports being regular. Patient is planning a trip in October and is worried about being able to lift her suitcase and carry it up the stairs. She reports that she has concerning blood work. Patient is progressing slowly towards goals and will benefit from continued PT to address deficits, reduce fecal incontinence and improve quality of life.  Limited by her COPD and difficulty with breathing and weakness.       OBJECTIVE IMPAIRMENTS: Abnormal gait, cardiopulmonary status limiting activity, decreased activity tolerance, decreased balance, decreased cognition, decreased coordination, decreased mobility, difficulty walking, decreased ROM, decreased strength, hypomobility, impaired flexibility, impaired tone, postural dysfunction, and pain.   ACTIVITY LIMITATIONS: carrying, lifting, bending, sitting, standing, squatting, sleeping, stairs, transfers, continence, toileting, reach over head, locomotion level, and caring for others  PARTICIPATION LIMITATIONS: meal prep, cleaning, laundry, personal finances, driving, shopping, community activity, occupation, yard work, and church  PERSONAL FACTORS: Age, Fitness, Time since onset of injury/illness/exacerbation, and 3+ comorbidities: CHF, COPD, rheumatoid  arthritis are also affecting patient's functional outcome.   REHAB POTENTIAL: Fair    CLINICAL DECISION MAKING: Evolving/moderate complexity  EVALUATION COMPLEXITY: Moderate   GOALS: Goals reviewed with patient? Yes  SHORT TERM GOALS: Target date: 06/01/2023    Pt will be independent with HEP.   Baseline: Goal status: progressing  2.  Pt will be able to stand at least 10 mins without increased hip, knee and back pain Baseline:  Goal status: progressing ( back hurts)  3.  Pt will report reducing fecal accidents to at most once/ week Baseline:  Goal status: met  4.  Pt will report reducing urinary leaking to max  once/ week Baseline:  Goal status: not yet   LONG TERM GOALS: Target date: 07/27/2023 POC update 10/26/2023  Pt will be independent with advanced HEP.   Baseline:  Goal status: progressing  2.  Pt will be independent with use of squatty potty, relaxed toileting mechanics, and improved bowel movement techniques in order to increase ease of bowel movements and complete evacuation.   Baseline:  Goal status: partially met  3.  Pt will be able to functional actions such as transfer in bed from supine to sit  without leakage  Baseline:  Goal status: progressing  4.  Pt will have no fecal or urinary accidents/ week to be able to participate in community activities Baseline:  Goal status: not yet   PLAN:  PT FREQUENCY: 1-2x/week  PT DURATION: 12 weeks  PLANNED INTERVENTIONS: 97110-Therapeutic exercises, 97530- Therapeutic activity, 97112- Neuromuscular re-education, 97535- Self Care, [id number]- Manual therapy, [phone]- Gait training, [phone]- Electrical stimulation (manual), Patient/Family education, Stair training, Taping, Dry Needling, Joint mobilization, Joint manipulation, Spinal manipulation, Spinal mobilization, Scar mobilization, Cryotherapy, Moist heat, and Biofeedback  PLAN FOR NEXT SESSION: cont exercises for strengthening   Nazli Penn, PT 08/28/2023,  10:26 AM

## 2023-09-02 ENCOUNTER — Ambulatory Visit (INDEPENDENT_AMBULATORY_CARE_PROVIDER_SITE_OTHER): Admitting: Emergency Medicine

## 2023-09-02 ENCOUNTER — Encounter: Payer: Self-pay | Admitting: Emergency Medicine

## 2023-09-02 VITALS — BP 127/80 | HR 72 | Ht 62.0 in | Wt 149.0 lb

## 2023-09-02 DIAGNOSIS — J449 Chronic obstructive pulmonary disease, unspecified: Secondary | ICD-10-CM | POA: Diagnosis not present

## 2023-09-02 DIAGNOSIS — R9389 Abnormal findings on diagnostic imaging of other specified body structures: Secondary | ICD-10-CM | POA: Diagnosis not present

## 2023-09-02 MED ORDER — TRELEGY ELLIPTA 100-62.5-25 MCG/ACT IN AEPB: INHALATION_SPRAY | RESPIRATORY_TRACT | Status: DC

## 2023-09-02 NOTE — Assessment & Plan Note (Signed)
 With right middle lobe narrowing that has been stable on serial imaging.  No new symptoms.  Would like we can defer a dedicated repeat CT chest now.  If she develops symptoms, clinical change, then we will reimage.

## 2023-09-02 NOTE — Telephone Encounter (Signed)
 Trelegy assistance paperwork faxed to [phone].

## 2023-09-02 NOTE — Patient Instructions (Addendum)
 Please continue Trelegy once daily.  Rinse and gargle after use. Keep your albuterol  available use 2 puffs when needed for shortness breath, chest tightness, wheezing. Continue physical therapy Get your flu shot and your COVID-19 vaccine this fall We will hold off on repeating a CT scan of the chest for now.  We might reconsider if you do develop new symptoms. Follow Dr. Shelah in 1 year, sooner if any problems.

## 2023-09-02 NOTE — Progress Notes (Signed)
 HPI:  ROV 09/02/2023 --83 year old woman with history of severe COPD as well as associated restrictive disease in the setting of rheumatoid arthritis.  She also has a history of left upper lobe wedge resection for squamous cell lung cancer, narrowing of the right middle lobe bronchus without any obvious mass lesion at the calcified parenchymal scarring and mucous plugging, bronchiectasis.  Also with obesity with OSA (untreated), history of pontine CVA.  At last visit we decided to defer any further CT chest unless there is clinical change given her overall stability over time. Today she reports she has been having more trouble w her RA. Has been on Arava  but had the dose decreased doe to leukopenia. Her breathing has been doing ok - maybe a bit less active, has some decreased energy she does not exercise much. She is doing some PT with some benefit. She believes that Trelegy helps her. She has a hoarse voice today - doesn't seem to bother her usually. Minimal cough. She has not needed her albuterol .    EXAM:  Vitals:   09/02/23 1145  BP: 127/80  Pulse: 72  SpO2: 96%  Weight: 149 lb (67.6 kg)  Height: 5' 2 (1.575 m)    Gen: Pleasant, overweight, in no distress,  normal affect  ENT: No lesions,  mouth clear, somewhat dry, oropharynx clear, no postnasal drip, voice  Neck: No JVD, no stridor  Lungs: No use of accessory muscles, few scattered rhonchi, no wheezing.  Cardiovascular: RRR, heart sounds normal, no murmur or gallops, no peripheral edema  Musculoskeletal: No deformities, no cyanosis or clubbing  Neuro: alert, non focal  Skin: Warm, no lesions or rashes     COPD (chronic obstructive pulmonary disease) (HCC) Please continue Trelegy once daily.  Rinse and gargle after use. Keep your albuterol  available use 2 puffs when needed for shortness breath, chest tightness, wheezing. Continue physical therapy Get your flu shot and your COVID-19 vaccine this fall Follow Dr. Shelah in 1  year, sooner if any problems.  Abnormal CT of the chest With right middle lobe narrowing that has been stable on serial imaging.  No new symptoms.  Would like we can defer a dedicated repeat CT chest now.  If she develops symptoms, clinical change, then we will reimage.     Lamar Shelah, MD, PhD 09/02/2023, 4:30 PM [facility] [phone] or if no answer [phone]

## 2023-09-02 NOTE — Assessment & Plan Note (Signed)
 Please continue Trelegy once daily.  Rinse and gargle after use. Keep your albuterol  available use 2 puffs when needed for shortness breath, chest tightness, wheezing. Continue physical therapy Get your flu shot and your COVID-19 vaccine this fall Follow Dr. Shelah in 1 year, sooner if any problems.

## 2023-09-04 ENCOUNTER — Ambulatory Visit: Admitting: Physical Therapy

## 2023-09-04 ENCOUNTER — Encounter: Payer: Self-pay | Admitting: Physical Therapy

## 2023-09-04 DIAGNOSIS — M6281 Muscle weakness (generalized): Secondary | ICD-10-CM | POA: Diagnosis not present

## 2023-09-04 DIAGNOSIS — R2689 Other abnormalities of gait and mobility: Secondary | ICD-10-CM

## 2023-09-04 DIAGNOSIS — R278 Other lack of coordination: Secondary | ICD-10-CM

## 2023-09-04 NOTE — Therapy (Signed)
 OUTPATIENT PHYSICAL THERAPY FEMALE PELVIC TREATMENT   Patient Name: Taylor Berry MRN: [id number] DOB:01-08-1940, 83 y.o., female Today's Date: 09/04/2023  END OF SESSION:  PT End of Session - 09/04/23 1111     Visit Number 11    Date for PT Re-Evaluation 10/26/23    Authorization Type [employer]    Authorization Time Period med necessity    PT Start Time 1110    PT Stop Time 1150    PT Time Calculation (min) 40 min    Activity Tolerance Patient tolerated treatment well    Behavior During Therapy WFL for tasks assessed/performed                Past Medical History:  Diagnosis Date   Abnormal CT scan    Allergic rhinitis    Anemia 2015   after lung surgery   Angio-edema    Complication of anesthesia    Fentanyl  allergy, claustrophobia   COPD (chronic obstructive pulmonary disease) (HCC)    Depression    Diaphoresis 07/30/2015   Excessive sweating with little exertion   Difficult intravenous access    GERD (gastroesophageal reflux disease)    In the past   Hyperlipidemia    Hypertension    Left bundle branch block    Lung cancer (HCC) 2015   left upper lobe wedge removed-no further tx.   Neuropathy    Tingling in arms, fingers, and feet (Since 06/15/16)   Obesity    OSA (obstructive sleep apnea)    denies.   Osteoarthritis    ra also   Osteoporosis    Recurrent upper respiratory infection (URI)    Rheumatoid arthritis(714.0)    Dr. Mai   SOB (shortness of breath) on exertion    Uterine cancer (HCC) 1971   tx surgical   Past Surgical History:  Procedure Laterality Date   ABDOMINAL HYSTERECTOMY     in situ carcinoma partial   CARDIAC CATHETERIZATION  01/27/11   minor non-obs CAD, NL EF, mild pulm HTN   CATARACT EXTRACTION, BILATERAL     last done 12'16-   COLONOSCOPY W/ BIOPSIES AND POLYPECTOMY     COLONOSCOPY WITH PROPOFOL  N/A 02/13/2015   Procedure: COLONOSCOPY WITH PROPOFOL ;  Surgeon: Lynwood Bohr, MD;  Location: [facility]  ENDOSCOPY;  Service: Endoscopy;  Laterality: N/A;   ESOPHAGOGASTRODUODENOSCOPY (EGD) WITH PROPOFOL  N/A 09/30/2016   Procedure: ESOPHAGOGASTRODUODENOSCOPY (EGD) WITH PROPOFOL ;  Surgeon: Bohr Lynwood, MD;  Location: [facility] ENDOSCOPY;  Service: Endoscopy;  Laterality: N/A;   LYMPH NODE DISSECTION Left 04/12/2013   Procedure: LYMPH NODE DISSECTION;  Surgeon: Dallas KATHEE Jude, MD;  Location: [facility] OR;  Service: Thoracic;  Laterality: Left;   RIGHT/LEFT HEART CATH AND CORONARY ANGIOGRAPHY N/A 10/08/2016   Procedure: RIGHT/LEFT HEART CATH AND CORONARY ANGIOGRAPHY;  Surgeon: Verlin Lonni BIRCH, MD;  Location: [facility] INVASIVE CV LAB;  Service: Cardiovascular;  Laterality: N/A;   TONSILLECTOMY     VESICOVAGINAL FISTULA CLOSURE W/ TAH  1971   VIDEO ASSISTED THORACOSCOPY (VATS)/THOROCOTOMY Left 04/12/2013   Procedure: VIDEO ASSISTED THORACOSCOPY (VATS)/THOROCOTOMY, WITH LEFT UPPER LOBE WEDGE RESECTION, CHEST WALL BIOPSY ;  Surgeon: Dallas KATHEE Jude, MD;  Location: [facility] OR;  Service: Thoracic;  Laterality: Left;   VIDEO BRONCHOSCOPY N/A 04/12/2013   Procedure: VIDEO BRONCHOSCOPY;  Surgeon: Dallas KATHEE Jude, MD;  Location: [facility] OR;  Service: Thoracic;  Laterality: N/A;   Patient Active Problem List   Diagnosis Date Noted   Impacted cerumen of right ear 01/17/2023   Dizziness 01/17/2023  Central perforation of tympanic membrane of right ear 01/17/2023   Other specified disorders of eustachian tube, right ear 01/17/2023   Hepatic lesion 12/20/2021   Chronic systolic heart failure (HCC)    Bradycardia 05/23/2021   Postural dizziness with presyncope 05/23/2021   Acute prerenal azotemia 05/23/2021   Cerebral thrombosis with cerebral infarction 01/02/2021   Encephalopathy acute/expressive aphasia  12/30/2020   Hyponatremia 12/30/2020   GERD (gastroesophageal reflux disease) 12/30/2020   Hypocalcemia 12/30/2020   Acute on chronic combined systolic and diastolic CHF (congestive heart failure) (HCC) 04/28/2019   non  obstructive CAD 04/28/2019   Edema 04/14/2019   Abnormal CT of the chest 03/22/2018   Acid reflux 07/13/2017   Affective psychosis (HCC) 07/13/2017   Alcohol dependence in remission (HCC) 07/13/2017   BD (Bowen's disease) 07/13/2017   History of colon polyps 07/13/2017   Rheumatoid arthritis (HCC) 07/13/2017   Chronic bronchitis (HCC) 07/13/2017   Secondary pulmonary hypertension 07/13/2017   Obstructive apnea 07/13/2017   Age related osteoporosis 07/13/2017   Non-ischemic cardiomyopathy/diastolic and systolic CHF    Hyperhidrosis 93/86/7981   Anemia in neoplastic disease 05/02/2013   Allergic rhinitis 04/29/2013   Depression 04/29/2013   Lung cancer, left upper lobe 02/03/2013   Pulmonary infiltrates 01/05/2013   Mild pulmonary hypertension (HCC) 02/17/2011   OSA (obstructive sleep apnea) 02/17/2011   Dyspnea on exertion 11/22/2010   COPD (chronic obstructive pulmonary disease) (HCC) 07/20/2009   Carcinoma in situ of cervix uteri 06/26/2009   Pure hypercholesterolemia 06/26/2009   OBESITY 06/26/2009   Chronic disease anemia 06/26/2009   Essential (primary) hypertension 06/26/2009   ARTHRITIS, RHEUMATOID 06/26/2009   OSTEOPOROSIS 06/26/2009   Cough 06/26/2009      PCP: Leonel Cole, MD  REFERRING PROVIDER: Legrand Victory LITTIE DOUGLAS, MD   REFERRING DIAG: R15.9 (ICD-10-CM) - Incontinence of feces, unspecified fecal incontinence type   THERAPY DIAG:  Muscle weakness (generalized)  Other abnormalities of gait and mobility  Other lack of coordination  Rationale for Evaluation and Treatment: Rehabilitation  ONSET DATE: pt reports 5 years  SUBJECTIVE:                                                                                                                                                                                           SUBJECTIVE STATEMENT: Patient reports that she had a couple accidents last week due to eating sweet potatoes. Her exercises are going OK.    Fluid intake: coffee, water  PAIN: no pain  PRECAUTIONS: None  RED FLAGS: None   WEIGHT BEARING RESTRICTIONS: No  FALLS:  Has patient fallen in last 6 months? No  OCCUPATION:  retired  ACTIVITY LEVEL : not very active as far as exercise since her stroke, drives, goes to appts, goes to the grocery store, plays violin in 2 symphonies, is sure that she knows not being so active has something to do with this   PLOF: Independent with household mobility with device  PATIENT GOALS: to be well, to able to stand up and walk right and not have fecal incontinence, it is frustrating   PERTINENT HISTORY:  Hysterectomy, CHF, COPD Sexual abuse: No  BOWEL MOVEMENT: Sometimes it will happen when she walks around the grocery store, accidents are random, sometimes it just happens, sometimes when she eats something spicy, unpredictable Pain with bowel movement: No Type of bowel movement:Type (Bristol Stool Scale) 3 or 6, when it is 6 she might have an accident, he just changed her medication Fully empty rectum: No- sometimes she does not Leakage: Yes:   Pads: Yes: all the time Fiber supplement/laxative Yes Metamucil- 2 capsules/ day. Will start Immodium tonight  URINATION: has to get up in the night, when she lies down and sits up, she does not have the strength to hold it Pain with urination: No Fully empty bladder: Yes:   Stream: Strong Urgency: Yes  Frequency: when she is going from lying down to sitting up, she leaks, during the day, it's a little bit of leaks, her and there Leakage: Urge to void, Walking to the bathroom, Coughing, and Sneezing Pads: Yes: 2- 3 / day  INTERCOURSE: not active   PREGNANCY: Vaginal deliveries 2- breech and 1 premature, almost died during the delivery Tearing No Episiotomy Yes  C-section deliveries 0 Currently pregnant No  PROLAPSE: None   OBJECTIVE:  Note: Objective measures were completed at Evaluation unless otherwise noted.   PATIENT  SURVEYS:    PFIQ-7: 119  COGNITION: Overall cognitive status: Within functional limits for tasks assessed     SENSATION: Light touch: Appears intact  LUMBAR SPECIAL TESTS:  Single leg stance test: Positive- decreased balance and weakness  GAIT: Assistive device utilized: Single point cane Comments: slow, antalgic  POSTURE: rounded shoulders, forward head, decreased lumbar lordosis, and flexed trunk    LUMBARAROM/PROM: limited throughout  LOWER EXTREMITY MNF:opfpuzi throughout   LOWER EXTREMITY MMT: 4-/5 bilateral hips and knees  PALPATION:   General: difficulty with engaging abdomen and pelvic floor  Pelvic Alignment: even  Abdominal: restrictions throughout abdomen                External Perineal Exam: deferred                              Internal Pelvic Floor: deferred   Patient confirms identification and approves PT to assess internal pelvic floor and treatment No  PELVIC MMT:   MMT eval  Vaginal   Internal Anal Sphincter   External Anal Sphincter   Puborectalis   Diastasis Recti no  (Blank rows = not tested)        TONE: Deferred   PROLAPSE: Deferred   TODAY'S TREATMENT:  DATE: Nu step 10 mins with therapist present to discuss progress STS with transverse abdominis breath  15 reps off chair and foam pad Leg press with transverse abdominis breath 30 lbs 30 reps Seated ball press with transverse abdominis breath 20 reps bilateral    08/28/2023 Nu step 10 mins with therapist present to discuss progress STS with #2 15 reps  Seated rowing red theraband with transverse abdominis breath 2x10 Seated ball press with transverse abdominis breath  15 reps Ball squeeze seated 10 reps Chest press with #2 15 reps   08/11/2023 Nu step 12 mins for global strengthening There acts- review of bowel irritants Transverse abdominis  breath with chest press 20 reps #2 #1 lb pronation/ supination bilateral 20 rep with diaphragmatic breathing  Seated ball press with transverse abdominis breath 10 reps for pelvic floor and abdominal activation     07/27/2023 There ex- NU step  10 mins Leg press- 40 lbs There acts- review of fiber, squatty potty, healthy bowel recommendations     06/29/2023 Neuro reed- Nu step- Nu step- 8 mins- with therapist present to discuss progress Leg press- 40 lbs with transverse abdominis breath 30 reps Arm bike - 2 mins forward and 3 mins backwards Hip adduction with ball with transverse abdominis breath 20 reps    06/16/2023   Neuro reed- Nu step- Nu step- 14 mins- with therapist present to discuss progress                     [employer] press with transverse abdominis breath  Hip adduction with ball with transverse abdominis breath  with thera band hold Ball press into table bilateral 10 reps       Neuro reed- ball press with pelvic floor lift, with transverse abdominis breath Hip adduction with ball  Sit to stand with transverse abdominis breath and pelvic floor contraction with red theraband holding Nustep 5:45 mins  Arm bike 2+2 mins Supine to sit with transverse abdominis breath and  PF contraction   PATIENT EDUCATION:  Education details: HEP, expectations of PT, exam findings Person educated: Patient Education method: Explanation, Demonstration, Tactile cues, Verbal cues, and Handouts Education comprehension: verbalized understanding, returned demonstration, verbal cues required, tactile cues required, and needs further education  HOME EXERCISE PROGRAM:  [id number]  ASSESSMENT:  CLINICAL IMPRESSION: Patient was seen today for treatment of fecal incontinence and urinary incontinence. Patient with limited endurance, core, global weakness and decreased mobility. Patient did well with exercises today. We discussed progress, HEP and recommended improved consistency with HEP.  Treatment session focused on exercise to improve core strength and hip strengthening and core coordination. Patient had minimal difficulty with nu step. She reported that she has some irritants that will give her diarrhea ( salsa, basil, nuts  and tomatoes and sweet potatoes) Imodium is helping, she is not constipated, takes 1/2 pill / day. Reports being regular. Patient is progressing slowly towards goals and will benefit from continued PT to address deficits, reduce fecal incontinence and improve quality of life.  Limited by her COPD and difficulty with breathing and weakness.       OBJECTIVE IMPAIRMENTS: Abnormal gait, cardiopulmonary status limiting activity, decreased activity tolerance, decreased balance, decreased cognition, decreased coordination, decreased mobility, difficulty walking, decreased ROM, decreased strength, hypomobility, impaired flexibility, impaired tone, postural dysfunction, and pain.   ACTIVITY LIMITATIONS: carrying, lifting, bending, sitting, standing, squatting, sleeping, stairs, transfers, continence, toileting, reach over head, locomotion level, and caring for others  PARTICIPATION LIMITATIONS: meal prep, cleaning,  laundry, personal finances, driving, shopping, community activity, occupation, yard work, and church  PERSONAL FACTORS: Age, Fitness, Time since onset of injury/illness/exacerbation, and 3+ comorbidities: CHF, COPD, rheumatoid arthritis are also affecting patient's functional outcome.   REHAB POTENTIAL: Fair    CLINICAL DECISION MAKING: Evolving/moderate complexity  EVALUATION COMPLEXITY: Moderate   GOALS: Goals reviewed with patient? Yes  SHORT TERM GOALS: Target date: 06/01/2023    Pt will be independent with HEP.   Baseline: Goal status: progressing  2.  Pt will be able to stand at least 10 mins without increased hip, knee and back pain Baseline:  Goal status: progressing ( back hurts)  3.  Pt will report reducing fecal accidents to at  most once/ week Baseline:  Goal status: met  4.  Pt will report reducing urinary leaking to max  once/ week Baseline:  Goal status: not yet   LONG TERM GOALS: Target date: 07/27/2023 POC update 10/26/2023  Pt will be independent with advanced HEP.   Baseline:  Goal status: progressing  2.  Pt will be independent with use of squatty potty, relaxed toileting mechanics, and improved bowel movement techniques in order to increase ease of bowel movements and complete evacuation.   Baseline:  Goal status: partially met  3.  Pt will be able to functional actions such as transfer in bed from supine to sit  without leakage  Baseline:  Goal status: progressing  4.  Pt will have no fecal or urinary accidents/ week to be able to participate in community activities Baseline:  Goal status: not yet   PLAN:  PT FREQUENCY: 1-2x/week  PT DURATION: 12 weeks  PLANNED INTERVENTIONS: 97110-Therapeutic exercises, 97530- Therapeutic activity, 97112- Neuromuscular re-education, 97535- Self Care, [id number]- Manual therapy, [phone]- Gait training, [phone]- Electrical stimulation (manual), Patient/Family education, Stair training, Taping, Dry Needling, Joint mobilization, Joint manipulation, Spinal manipulation, Spinal mobilization, Scar mobilization, Cryotherapy, Moist heat, and Biofeedback  PLAN FOR NEXT SESSION: cont exercises for strengthening   Caidan Hubbert, PT 09/04/2023, 11:14 AM

## 2023-09-16 ENCOUNTER — Ambulatory Visit (INDEPENDENT_AMBULATORY_CARE_PROVIDER_SITE_OTHER): Admitting: Podiatry

## 2023-09-16 ENCOUNTER — Other Ambulatory Visit: Payer: Self-pay

## 2023-09-16 ENCOUNTER — Encounter: Payer: Self-pay | Admitting: Podiatry

## 2023-09-16 ENCOUNTER — Other Ambulatory Visit (HOSPITAL_COMMUNITY): Payer: Self-pay

## 2023-09-16 DIAGNOSIS — B351 Tinea unguium: Secondary | ICD-10-CM | POA: Diagnosis not present

## 2023-09-16 DIAGNOSIS — Z85828 Personal history of other malignant neoplasm of skin: Secondary | ICD-10-CM | POA: Diagnosis not present

## 2023-09-16 DIAGNOSIS — L84 Corns and callosities: Secondary | ICD-10-CM

## 2023-09-16 DIAGNOSIS — M79609 Pain in unspecified limb: Secondary | ICD-10-CM

## 2023-09-16 DIAGNOSIS — L239 Allergic contact dermatitis, unspecified cause: Secondary | ICD-10-CM | POA: Diagnosis not present

## 2023-09-16 MED ORDER — TRIAMCINOLONE ACETONIDE 0.1 % EX OINT
TOPICAL_OINTMENT | CUTANEOUS | 1 refills | Status: AC
  Filled 2023-09-16: qty 80, 30d supply, fill #0

## 2023-09-16 NOTE — Progress Notes (Signed)
 Subjective: Taylor Berry is a 83 y.o. female patient who presents for follow-up of pre-ulcerative area Requesting to have nails trimmed today.  States doing better. She has rheumatoid arthritis Patient denies any new cramping, numbness, burning or tingling in the legs or feet. She has ABIs scheduled soon.  Patient Active Problem List   Diagnosis Date Noted   Impacted cerumen of right ear 01/17/2023   Dizziness 01/17/2023   Central perforation of tympanic membrane of right ear 01/17/2023   Other specified disorders of eustachian tube, right ear 01/17/2023   Hepatic lesion 12/20/2021   Chronic systolic heart failure (HCC)    Bradycardia 05/23/2021   Postural dizziness with presyncope 05/23/2021   Acute prerenal azotemia 05/23/2021   Cerebral thrombosis with cerebral infarction 01/02/2021   Encephalopathy acute/expressive aphasia  12/30/2020   Hyponatremia 12/30/2020   GERD (gastroesophageal reflux disease) 12/30/2020   Hypocalcemia 12/30/2020   Acute on chronic combined systolic and diastolic CHF (congestive heart failure) (HCC) 04/28/2019   non obstructive CAD 04/28/2019   Edema 04/14/2019   Abnormal CT of the chest 03/22/2018   Acid reflux 07/13/2017   Affective psychosis (HCC) 07/13/2017   Alcohol dependence in remission (HCC) 07/13/2017   BD (Bowen's disease) 07/13/2017   History of colon polyps 07/13/2017   Rheumatoid arthritis (HCC) 07/13/2017   Chronic bronchitis (HCC) 07/13/2017   Secondary pulmonary hypertension 07/13/2017   Obstructive apnea 07/13/2017   Age related osteoporosis 07/13/2017   Non-ischemic cardiomyopathy/diastolic and systolic CHF    Hyperhidrosis 93/86/7981   Anemia in neoplastic disease 05/02/2013   Allergic rhinitis 04/29/2013   Depression 04/29/2013   Lung cancer, left upper lobe 02/03/2013   Pulmonary infiltrates 01/05/2013   Mild pulmonary hypertension (HCC) 02/17/2011   OSA (obstructive sleep apnea) 02/17/2011   Dyspnea on exertion  11/22/2010   COPD (chronic obstructive pulmonary disease) (HCC) 07/20/2009   Carcinoma in situ of cervix uteri 06/26/2009   Pure hypercholesterolemia 06/26/2009   OBESITY 06/26/2009   Chronic disease anemia 06/26/2009   Essential (primary) hypertension 06/26/2009   ARTHRITIS, RHEUMATOID 06/26/2009   OSTEOPOROSIS 06/26/2009   Cough 06/26/2009   Current Outpatient Medications on File Prior to Visit  Medication Sig Dispense Refill   albuterol  (VENTOLIN  HFA) 108 (90 Base) MCG/ACT inhaler Inhale 2 puffs into the lungs every 4 (four) hours as needed for wheezing or shortness of breath. 6.7 g 1   atorvastatin  (LIPITOR) 40 MG tablet Take 1 tablet (40 mg total) by mouth daily. 90 tablet 3   Calcium  Carb-Cholecalciferol (CALCIUM  + D3 PO) Take 1 tablet by mouth daily.     candesartan  (ATACAND ) 8 MG tablet Take 1 tablet (8 mg total) by mouth daily. 90 tablet 2   clopidogrel  (PLAVIX ) 75 MG tablet Take 1 tablet (75 mg total) by mouth daily. 90 tablet 3   Cyanocobalamin  (VITAMIN B12) 1000 MCG TBCR Take 1 tablet by mouth daily.     fluticasone  (FLONASE ) 50 MCG/ACT nasal spray Place 2 sprays into both nostrils every morning. 48 g 1   Fluticasone -Umeclidin-Vilant (TRELEGY ELLIPTA ) 100-62.5-25 MCG/ACT AEPB Inhale 1 puff into the lungs daily. 180 each 1   Fluticasone -Umeclidin-Vilant (TRELEGY ELLIPTA ) 100-62.5-25 MCG/ACT AEPB 2 samples     Fluticasone -Umeclidin-Vilant (TRELEGY ELLIPTA ) 100-62.5-25 MCG/ACT AEPB 2 samples     leflunomide  (ARAVA ) 10 MG tablet Take 1 tablet (10 mg total) by mouth daily. 90 tablet 1   loperamide (IMODIUM A-D) 2 MG tablet Take 2 mg by mouth as needed.     meclizine  (ANTIVERT ) 25 MG  tablet Take 1 tablet (25 mg total) by mouth up to 3 (three) times daily as needed for vertigo. 20 tablet 0   METAMUCIL FIBER PO Take by mouth See admin instructions. Mix 1 tablespoonful of powder into 4-8 ounces of desired beverage and drink once a day     Multiple Vitamins-Minerals (PRESERVISION AREDS)  CAPS Take by mouth.     triamcinolone  ointment (KENALOG ) 0.1 % Apply to affected skin 2 (two) times daily. 30 g 0   CALCIUM -VITAMIN D  PO Take 1 tablet by mouth daily. (Patient not taking: Reported on 09/16/2023)     leflunomide  (ARAVA ) 20 MG tablet Take 1 tablet (20 mg total) by mouth daily. (Patient not taking: Reported on 09/02/2023) 90 tablet 1   No current facility-administered medications on file prior to visit.   Allergies  Allergen Reactions   Latex Itching and Rash   Metoprolol  Tartrate Other (See Comments) and Shortness Of Breath   Ace Inhibitors Cough   Angiotensin Receptor Blockers Swelling and Other (See Comments)    Tongue swelling   Fentanyl  Other (See Comments)    Hallucinations I go crazy      Guaifenesin  Er Diarrhea    Other Reaction(s): Not available   Hydrochlorothiazide Other (See Comments)    Decreased Sodium   Hydroxychloroquine Other (See Comments)    Decreased sodium   Penicillamine Nausea And Vomiting   Penicillins Other (See Comments)    Yeast infections Has patient had a PCN reaction causing immediate rash, facial/tongue/throat swelling, SOB or lightheadedness with hypotension:No Has patient had a PCN reaction causing severe rash involving mucus membranes or skin necrosis: No Has patient had a PCN reaction that required hospitalization No Has patient had a PCN reaction occurring within the last 10 years: Yes If all of the above answers are NO, then may proceed with Cephalosporin use.  Other reaction(s): yeast infection     Objective: General: Patient is awake, alert, and oriented x 3 and in no acute distress.  Integument: Skin is warm, dry and supple bilateral. Nails are tender, long, thickened and  dystrophic with subungual debris, consistent with onychomycosis, 1-5 bilateral. No signs of infection. Lateral fifth metatarsal base noted ulceration healed. Remaining integument unremarkable.  Vasculature:  Dorsalis Pedis pulse 1/4 bilateral.  Posterior Tibial pulse  1/4 bilateral.  Capillary fill time <3 sec 1-5 bilateral. Positive hair growth to the level of the digits. Temperature gradient within normal limits. No varicosities present bilateral. No edema present bilateral.   Neurology: The patient has intact sensation measured with a 5.07/10g Semmes Weinstein Monofilament at all pedal sites bilateral . Vibratory sensation diminished bilateral with tuning fork. No Babinski sign present bilateral.   Musculoskeletal: No symptomatic pedal deformities noted bilateral. Muscular strength 5/5 in all lower extremity muscular groups bilateral without pain on range of motion . No tenderness with calf compression bilateral.  Summary:  Right: Resting right ankle-brachial index is within normal range. The  right toe-brachial index is abnormal.   Left: Resting left ankle-brachial index is within normal range. The left  toe-brachial index is abnormal.   Assessment and Plan:   ICD-10-CM   1. Pain due to onychomycosis of nail  B35.1    M79.609     2. Pre-ulcerative calluses  L84            -Examined patient. Ulcer lateral plantar fifth metatarsal base right foot healed -Ucleration healed debridement of hyperkeratotic overlying tissue with chisel without incident.  -Off-loading with donut pads  -Mechanically debrided all  nails 1-5 bilateral using sterile nail nipper and filed with dremel without incident  -Answered all patient questions -Patient to return  in 3 months for at risk foot care -Patient advised to call the office if any problems or questions arise in the meantime.    Return in about 3 months (around 12/16/2023) for rfc.    Asberry Failing, DPM

## 2023-09-21 ENCOUNTER — Other Ambulatory Visit: Payer: Self-pay

## 2023-09-21 ENCOUNTER — Other Ambulatory Visit (HOSPITAL_COMMUNITY): Payer: Self-pay

## 2023-09-23 ENCOUNTER — Ambulatory Visit: Payer: Self-pay | Attending: Gastroenterology | Admitting: Physical Therapy

## 2023-09-23 ENCOUNTER — Encounter: Payer: Self-pay | Admitting: Physical Therapy

## 2023-09-23 DIAGNOSIS — R2689 Other abnormalities of gait and mobility: Secondary | ICD-10-CM | POA: Diagnosis not present

## 2023-09-23 DIAGNOSIS — M6281 Muscle weakness (generalized): Secondary | ICD-10-CM | POA: Insufficient documentation

## 2023-09-23 DIAGNOSIS — R278 Other lack of coordination: Secondary | ICD-10-CM | POA: Insufficient documentation

## 2023-09-23 NOTE — Therapy (Signed)
 OUTPATIENT PHYSICAL THERAPY FEMALE PELVIC TREATMENT   Patient Name: Taylor Berry MRN: [id number] DOB:[birth year], 83 y.o., female Today's Date: 09/23/2023  END OF SESSION:  PT End of Session - 09/23/23 1547     Visit Number 12    Date for PT Re-Evaluation 10/26/23    Authorization Type [employer]    Authorization Time Period med necessity    Progress Note Due on Visit 10    PT Start Time 1543    PT Stop Time 1629    PT Time Calculation (min) 46 min    Activity Tolerance Patient tolerated treatment well    Behavior During Therapy WFL for tasks assessed/performed                Past Medical History:  Diagnosis Date   Abnormal CT scan    Allergic rhinitis    Anemia 2015   after lung surgery   Angio-edema    Complication of anesthesia    Fentanyl  allergy, claustrophobia   COPD (chronic obstructive pulmonary disease) (HCC)    Depression    Diaphoresis 07/30/2015   Excessive sweating with little exertion   Difficult intravenous access    GERD (gastroesophageal reflux disease)    In the past   Hyperlipidemia    Hypertension    Left bundle branch block    Lung cancer (HCC) 2015   left upper lobe wedge removed-no further tx.   Neuropathy    Tingling in arms, fingers, and feet (Since 06/15/16)   Obesity    OSA (obstructive sleep apnea)    denies.   Osteoarthritis    ra also   Osteoporosis    Recurrent upper respiratory infection (URI)    Rheumatoid arthritis(714.0)    Dr. Mai   SOB (shortness of breath) on exertion    Uterine cancer (HCC) 1971   tx surgical   Past Surgical History:  Procedure Laterality Date   ABDOMINAL HYSTERECTOMY     in situ carcinoma partial   CARDIAC CATHETERIZATION  01/27/11   minor non-obs CAD, NL EF, mild pulm HTN   CATARACT EXTRACTION, BILATERAL     last done 12'16-   COLONOSCOPY W/ BIOPSIES AND POLYPECTOMY     COLONOSCOPY WITH PROPOFOL  N/A 02/13/2015   Procedure: COLONOSCOPY WITH PROPOFOL ;  Surgeon: Lynwood Bohr, MD;  Location: [facility];  Service: Endoscopy;  Laterality: N/A;   ESOPHAGOGASTRODUODENOSCOPY (EGD) WITH PROPOFOL  N/A 09/30/2016   Procedure: ESOPHAGOGASTRODUODENOSCOPY (EGD) WITH PROPOFOL ;  Surgeon: Bohr Lynwood, MD;  Location: [facility];  Service: Endoscopy;  Laterality: N/A;   LYMPH NODE DISSECTION Left 04/12/2013   Procedure: LYMPH NODE DISSECTION;  Surgeon: Dallas KATHEE Jude, MD;  Location: [facility];  Service: Thoracic;  Laterality: Left;   RIGHT/LEFT HEART CATH AND CORONARY ANGIOGRAPHY N/A 10/08/2016   Procedure: RIGHT/LEFT HEART CATH AND CORONARY ANGIOGRAPHY;  Surgeon: Verlin Lonni BIRCH, MD;  Location: [facility];  Service: Cardiovascular;  Laterality: N/A;   TONSILLECTOMY     VESICOVAGINAL FISTULA CLOSURE W/ TAH  1971   VIDEO ASSISTED THORACOSCOPY (VATS)/THOROCOTOMY Left 04/12/2013   Procedure: VIDEO ASSISTED THORACOSCOPY (VATS)/THOROCOTOMY, WITH LEFT UPPER LOBE WEDGE RESECTION, CHEST WALL BIOPSY ;  Surgeon: Dallas KATHEE Jude, MD;  Location: [facility] OR;  Service: Thoracic;  Laterality: Left;   VIDEO BRONCHOSCOPY N/A 04/12/2013   Procedure: VIDEO BRONCHOSCOPY;  Surgeon: Dallas KATHEE Jude, MD;  Location: [facility] OR;  Service: Thoracic;  Laterality: N/A;   Patient Active Problem List   Diagnosis Date Noted   Impacted cerumen  of right ear 01/17/2023   Dizziness 01/17/2023   Central perforation of tympanic membrane of right ear 01/17/2023   Other specified disorders of eustachian tube, right ear 01/17/2023   Hepatic lesion 12/20/2021   Chronic systolic heart failure (HCC)    Bradycardia 05/23/2021   Postural dizziness with presyncope 05/23/2021   Acute prerenal azotemia 05/23/2021   Cerebral thrombosis with cerebral infarction 01/02/2021   Encephalopathy acute/expressive aphasia  12/30/2020   Hyponatremia 12/30/2020   GERD (gastroesophageal reflux disease) 12/30/2020   Hypocalcemia 12/30/2020   Acute on chronic combined systolic and diastolic CHF (congestive heart failure)  (HCC) 04/28/2019   non obstructive CAD 04/28/2019   Edema 04/14/2019   Abnormal CT of the chest 03/22/2018   Acid reflux 07/13/2017   Affective psychosis (HCC) 07/13/2017   Alcohol dependence in remission (HCC) 07/13/2017   BD (Bowen's disease) 07/13/2017   History of colon polyps 07/13/2017   Rheumatoid arthritis (HCC) 07/13/2017   Chronic bronchitis (HCC) 07/13/2017   Secondary pulmonary hypertension 07/13/2017   Obstructive apnea 07/13/2017   Age related osteoporosis 07/13/2017   Non-ischemic cardiomyopathy/diastolic and systolic CHF    Hyperhidrosis 93/86/7981   Anemia in neoplastic disease 05/02/2013   Allergic rhinitis 04/29/2013   Depression 04/29/2013   Lung cancer, left upper lobe 02/03/2013   Pulmonary infiltrates 01/05/2013   Mild pulmonary hypertension (HCC) 02/17/2011   OSA (obstructive sleep apnea) 02/17/2011   Dyspnea on exertion 11/22/2010   COPD (chronic obstructive pulmonary disease) (HCC) 07/20/2009   Carcinoma in situ of cervix uteri 06/26/2009   Pure hypercholesterolemia 06/26/2009   OBESITY 06/26/2009   Chronic disease anemia 06/26/2009   Essential (primary) hypertension 06/26/2009   ARTHRITIS, RHEUMATOID 06/26/2009   OSTEOPOROSIS 06/26/2009   Cough 06/26/2009      PCP: Leonel Cole, MD  REFERRING PROVIDER: Legrand Victory LITTIE DOUGLAS, MD   REFERRING DIAG: R15.9 (ICD-10-CM) - Incontinence of feces, unspecified fecal incontinence type   THERAPY DIAG:  Muscle weakness (generalized)  Other lack of coordination  Other abnormalities of gait and mobility  Rationale for Evaluation and Treatment: Rehabilitation  ONSET DATE: pt reports 5 years  SUBJECTIVE:                                                                                                                                                                                           SUBJECTIVE STATEMENT: Patient reports that she felt good after last visit. She reports that she had an accident a couple  of weeks ago when she had salmonella.  Reports that her mobility is limited, it was hard to walk at her last concert.     Last  session: Patient reports that she had a couple accidents last week due to eating sweet potatoes. Her exercises are going OK.   Fluid intake: coffee, water  PAIN: no pain  PRECAUTIONS: None  RED FLAGS: None   WEIGHT BEARING RESTRICTIONS: No  FALLS:  Has patient fallen in last 6 months? No  OCCUPATION: retired  ACTIVITY LEVEL : not very active as far as exercise since her stroke, drives, goes to appts, goes to the grocery store, plays violin in 2 symphonies, is sure that she knows not being so active has something to do with this   PLOF: Independent with household mobility with device  PATIENT GOALS: to be well, to able to stand up and walk right and not have fecal incontinence, it is frustrating   PERTINENT HISTORY:  Hysterectomy, CHF, COPD Sexual abuse: No  BOWEL MOVEMENT: Sometimes it will happen when she walks around the grocery store, accidents are random, sometimes it just happens, sometimes when she eats something spicy, unpredictable Pain with bowel movement: No Type of bowel movement:Type (Bristol Stool Scale) 3 or 6, when it is 6 she might have an accident, he just changed her medication Fully empty rectum: No- sometimes she does not Leakage: Yes:   Pads: Yes: all the time Fiber supplement/laxative Yes Metamucil- 2 capsules/ day. Will start Immodium tonight  URINATION: has to get up in the night, when she lies down and sits up, she does not have the strength to hold it Pain with urination: No Fully empty bladder: Yes:   Stream: Strong Urgency: Yes  Frequency: when she is going from lying down to sitting up, she leaks, during the day, it's a little bit of leaks, her and there Leakage: Urge to void, Walking to the bathroom, Coughing, and Sneezing Pads: Yes: 2- 3 / day  INTERCOURSE: not active   PREGNANCY: Vaginal deliveries 2-  breech and 1 premature, almost died during the delivery Tearing No Episiotomy Yes  C-section deliveries 0 Currently pregnant No  PROLAPSE: None   OBJECTIVE:  Note: Objective measures were completed at Evaluation unless otherwise noted.   PATIENT SURVEYS:    PFIQ-7: 119  COGNITION: Overall cognitive status: Within functional limits for tasks assessed     SENSATION: Light touch: Appears intact  LUMBAR SPECIAL TESTS:  Single leg stance test: Positive- decreased balance and weakness  GAIT: Assistive device utilized: Single point cane Comments: slow, antalgic  POSTURE: rounded shoulders, forward head, decreased lumbar lordosis, and flexed trunk    LUMBARAROM/PROM: limited throughout  LOWER EXTREMITY MNF:opfpuzi throughout   LOWER EXTREMITY MMT: 4-/5 bilateral hips and knees  PALPATION:   General: difficulty with engaging abdomen and pelvic floor  Pelvic Alignment: even  Abdominal: restrictions throughout abdomen                External Perineal Exam: deferred                              Internal Pelvic Floor: deferred   Patient confirms identification and approves PT to assess internal pelvic floor and treatment No  PELVIC MMT:   MMT eval  Vaginal   Internal Anal Sphincter   External Anal Sphincter   Puborectalis   Diastasis Recti no  (Blank rows = not tested)        TONE: Deferred   PROLAPSE: Deferred   TODAY'S TREATMENT:  DATE: 09/23/2023 Nu step 12 mins with therapist present to discuss progress Leg press with transverse abdominis breath 30 reps 30 lbs STS 15 reps #3 with foam #3 bicep curls and punches 10x2 bilateral # 2 diagonal shoulder raises 12 reps Farmer's carry #3   09/04/2023 Nu step 10 mins with therapist present to discuss progress STS with transverse abdominis breath  15 reps off chair and foam  pad Leg press with transverse abdominis breath 30 lbs 30 reps Seated ball press with transverse abdominis breath 20 reps bilateral Hip adduction seated  with transverse abdominis breath with ball 2x10      08/28/2023 Nu step 10 mins with therapist present to discuss progress STS with #2 15 reps  Seated rowing red theraband with transverse abdominis breath 2x10 Seated ball press with transverse abdominis breath  15 reps Ball squeeze seated 10 reps Chest press with #2 15 reps   08/11/2023 Nu step 12 mins for global strengthening There acts- review of bowel irritants Transverse abdominis breath with chest press 20 reps #2 #1 lb pronation/ supination bilateral 20 rep with diaphragmatic breathing  Seated ball press with transverse abdominis breath 10 reps for pelvic floor and abdominal activation     07/27/2023 There ex- NU step  10 mins Leg press- 40 lbs There acts- review of fiber, squatty potty, healthy bowel recommendations     06/29/2023 Neuro reed- Nu step- Nu step- 8 mins- with therapist present to discuss progress Leg press- 40 lbs with transverse abdominis breath 30 reps Arm bike - 2 mins forward and 3 mins backwards Hip adduction with ball with transverse abdominis breath 20 reps    06/16/2023   Neuro reed- Nu step- Nu step- 14 mins- with therapist present to discuss progress                     [employer] press with transverse abdominis breath  Hip adduction with ball with transverse abdominis breath  with thera band hold Ball press into table bilateral 10 reps       Neuro reed- ball press with pelvic floor lift, with transverse abdominis breath Hip adduction with ball  Sit to stand with transverse abdominis breath and pelvic floor contraction with red theraband holding Nustep 5:45 mins  Arm bike 2+2 mins Supine to sit with transverse abdominis breath and  PF contraction   PATIENT EDUCATION:  Education details: HEP, expectations of PT, exam findings Person  educated: Patient Education method: Explanation, Demonstration, Tactile cues, Verbal cues, and Handouts Education comprehension: verbalized understanding, returned demonstration, verbal cues required, tactile cues required, and needs further education  HOME EXERCISE PROGRAM:  [id number]  ASSESSMENT:  CLINICAL IMPRESSION: Patient was seen today for treatment of fecal incontinence and urinary incontinence. Patient with limited endurance, core, global weakness and decreased mobility. Patient did well with exercises today. We discussed progress, HEP and recommended improved consistency with HEP. Treatment session focused on exercise to improve core strength and hip strengthening and core coordination. Patient had minimal difficulty with nu step. She reported that she had salmonella poisoning and a fecal accident from that a couple of weeks ago. She was fatigued today from some yard work.  Imodium is helping, she is not constipated, takes 1/2 pill / day. Reports being regular. Patient is progressing slowly towards goals and will benefit from continued PT to address deficits, reduce fecal incontinence and improve quality of life.  Limited by her COPD and difficulty with breathing and weakness.  OBJECTIVE IMPAIRMENTS: Abnormal gait, cardiopulmonary status limiting activity, decreased activity tolerance, decreased balance, decreased cognition, decreased coordination, decreased mobility, difficulty walking, decreased ROM, decreased strength, hypomobility, impaired flexibility, impaired tone, postural dysfunction, and pain.   ACTIVITY LIMITATIONS: carrying, lifting, bending, sitting, standing, squatting, sleeping, stairs, transfers, continence, toileting, reach over head, locomotion level, and caring for others  PARTICIPATION LIMITATIONS: meal prep, cleaning, laundry, personal finances, driving, shopping, community activity, occupation, yard work, and church  PERSONAL FACTORS: Age, Fitness, Time since  onset of injury/illness/exacerbation, and 3+ comorbidities: CHF, COPD, rheumatoid arthritis are also affecting patient's functional outcome.   REHAB POTENTIAL: Fair    CLINICAL DECISION MAKING: Evolving/moderate complexity  EVALUATION COMPLEXITY: Moderate   GOALS: Goals reviewed with patient? Yes  SHORT TERM GOALS: Target date: 06/01/2023    Pt will be independent with HEP.   Baseline: Goal status: progressing  2.  Pt will be able to stand at least 10 mins without increased hip, knee and back pain Baseline:  Goal status: progressing ( back hurts)  3.  Pt will report reducing fecal accidents to at most once/ week Baseline:  Goal status: met  4.  Pt will report reducing urinary leaking to max  once/ week Baseline:  Goal status: not yet   LONG TERM GOALS: Target date: 07/27/2023 POC update 10/26/2023  Pt will be independent with advanced HEP.   Baseline:  Goal status: progressing  2.  Pt will be independent with use of squatty potty, relaxed toileting mechanics, and improved bowel movement techniques in order to increase ease of bowel movements and complete evacuation.   Baseline:  Goal status: partially met  3.  Pt will be able to functional actions such as transfer in bed from supine to sit  without leakage  Baseline:  Goal status: progressing  4.  Pt will have no fecal or urinary accidents/ week to be able to participate in community activities Baseline:  Goal status: not yet   PLAN:  PT FREQUENCY: 1-2x/week  PT DURATION: 12 weeks  PLANNED INTERVENTIONS: 97110-Therapeutic exercises, 97530- Therapeutic activity, 97112- Neuromuscular re-education, 97535- Self Care, [id number]- Manual therapy, [phone]- Gait training, [phone]- Electrical stimulation (manual), Patient/Family education, Stair training, Taping, Dry Needling, Joint mobilization, Joint manipulation, Spinal manipulation, Spinal mobilization, Scar mobilization, Cryotherapy, Moist heat, and Biofeedback  PLAN FOR  NEXT SESSION: cont exercises for strengthening   Jaamal Farooqui, PT 09/23/2023, 4:31 PM

## 2023-09-23 NOTE — Progress Notes (Signed)
 HPI: FU CM. Pt with H/O dyspnea; also with h/o lung ca, COPD and OSA. Cardiac catheterization October 2018 showed mild nonobstructive coronary disease and normal filling pressures. Echocardiogram repeated March 2019 and showed ejection fraction 30-35% and mild left atrial enlargement. Declined ICD previously. Monitor September 2021 showed sinus bradycardia, normal sinus rhythm, occasional PAC, 4 and 6 beats of PAT and occasional PVC. Patient had CVA December 2022 likely secondary to small vessel disease. Monitor June 2023 showed sinus rhythm with occasional PAC, brief runs of PAT, occasional PVC and rare couplet.  Seen May 2023 with dizziness and beta-blocker discontinued.  Most recent echocardiogram June 2024 showed normal LV function, grade 1 diastolic dysfunction, mild left atrial enlargement.  ABIs April 2025 normal bilaterally with abnormal toe-brachial index bilaterally.  Since last seen she has chronic dyspnea on exertion but no orthopnea, PND, chest pain or syncope.  Current Outpatient Medications  Medication Sig Dispense Refill   albuterol  (VENTOLIN  HFA) 108 (90 Base) MCG/ACT inhaler Inhale 2 puffs into the lungs every 4 (four) hours as needed for wheezing or shortness of breath. 6.7 g 1   atorvastatin  (LIPITOR) 40 MG tablet Take 1 tablet (40 mg total) by mouth daily. 90 tablet 3   Calcium  Carb-Cholecalciferol (CALCIUM  + D3 PO) Take 1 tablet by mouth daily.     CALCIUM -VITAMIN D  PO Take 1 tablet by mouth daily.     candesartan  (ATACAND ) 8 MG tablet Take 1 tablet (8 mg total) by mouth daily. 90 tablet 2   clopidogrel  (PLAVIX ) 75 MG tablet Take 1 tablet (75 mg total) by mouth daily. 90 tablet 3   Cyanocobalamin  (VITAMIN B12) 1000 MCG TBCR Take 1 tablet by mouth daily.     fluticasone  (FLONASE ) 50 MCG/ACT nasal spray Place 2 sprays into both nostrils every morning. 48 g 1   Fluticasone -Umeclidin-Vilant (TRELEGY ELLIPTA ) 100-62.5-25 MCG/ACT AEPB Inhale 1 puff into the lungs daily. 180 each 1    loperamide (IMODIUM A-D) 2 MG tablet Take 2 mg by mouth as needed.     meclizine  (ANTIVERT ) 25 MG tablet Take 1 tablet (25 mg total) by mouth up to 3 (three) times daily as needed for vertigo. 20 tablet 0   METAMUCIL FIBER PO Take by mouth See admin instructions. Mix 1 tablespoonful of powder into 4-8 ounces of desired beverage and drink once a day     Multiple Vitamins-Minerals (PRESERVISION AREDS) CAPS Take by mouth.     triamcinolone  ointment (KENALOG ) 0.1 % APPLY ON THE SKIN DAILY AS NEEDED FOR BUG BITES 80 g 1   Fluticasone -Umeclidin-Vilant (TRELEGY ELLIPTA ) 100-62.5-25 MCG/ACT AEPB 2 samples (Patient not taking: Reported on 09/30/2023)     Fluticasone -Umeclidin-Vilant (TRELEGY ELLIPTA ) 100-62.5-25 MCG/ACT AEPB 2 samples     leflunomide  (ARAVA ) 10 MG tablet Take 1 tablet (10 mg total) by mouth daily. 90 tablet 1   leflunomide  (ARAVA ) 20 MG tablet Take 1 tablet (20 mg total) by mouth daily. (Patient not taking: Reported on 09/02/2023) 90 tablet 1   No current facility-administered medications for this visit.     Past Medical History:  Diagnosis Date   Abnormal CT scan    Allergic rhinitis    Anemia 2015   after lung surgery   Angio-edema    Complication of anesthesia    Fentanyl  allergy, claustrophobia   COPD (chronic obstructive pulmonary disease) (HCC)    Depression    Diaphoresis 07/30/2015   Excessive sweating with little exertion   Difficult intravenous access    GERD (  gastroesophageal reflux disease)    In the past   Hyperlipidemia    Hypertension    Left bundle branch block    Lung cancer (HCC) 2015   left upper lobe wedge removed-no further tx.   Neuropathy    Tingling in arms, fingers, and feet (Since 06/15/16)   Obesity    OSA (obstructive sleep apnea)    denies.   Osteoarthritis    ra also   Osteoporosis    Recurrent upper respiratory infection (URI)    Rheumatoid arthritis(714.0)    Dr. Mai   SOB (shortness of breath) on exertion    Uterine cancer  (HCC) 1971   tx surgical    Past Surgical History:  Procedure Laterality Date   ABDOMINAL HYSTERECTOMY     in situ carcinoma partial   CARDIAC CATHETERIZATION  01/27/11   minor non-obs CAD, NL EF, mild pulm HTN   CATARACT EXTRACTION, BILATERAL     last done 12'16-   COLONOSCOPY W/ BIOPSIES AND POLYPECTOMY     COLONOSCOPY WITH PROPOFOL  N/A 02/13/2015   Procedure: COLONOSCOPY WITH PROPOFOL ;  Surgeon: Lynwood Bohr, MD;  Location: [facility];  Service: Endoscopy;  Laterality: N/A;   ESOPHAGOGASTRODUODENOSCOPY (EGD) WITH PROPOFOL  N/A 09/30/2016   Procedure: ESOPHAGOGASTRODUODENOSCOPY (EGD) WITH PROPOFOL ;  Surgeon: Bohr Lynwood, MD;  Location: [facility];  Service: Endoscopy;  Laterality: N/A;   LYMPH NODE DISSECTION Left 04/12/2013   Procedure: LYMPH NODE DISSECTION;  Surgeon: Dallas KATHEE Jude, MD;  [facility]: [facility] OR;  Service: Thoracic;  Laterality: Left;   RIGHT/LEFT HEART CATH AND CORONARY ANGIOGRAPHY N/A 10/08/2016   Procedure: RIGHT/LEFT HEART CATH AND CORONARY ANGIOGRAPHY;  Surgeon: Verlin Lonni BIRCH, MD;  Location: [facility];  Service: Cardiovascular;  Laterality: N/A;   TONSILLECTOMY     VESICOVAGINAL FISTULA CLOSURE W/ TAH  1971   VIDEO ASSISTED THORACOSCOPY (VATS)/THOROCOTOMY Left 04/12/2013   Procedure: VIDEO ASSISTED THORACOSCOPY (VATS)/THOROCOTOMY, WITH LEFT UPPER LOBE WEDGE RESECTION, CHEST WALL BIOPSY ;  Surgeon: Dallas KATHEE Jude, MD;  Location: [facility] OR;  Service: Thoracic;  Laterality: Left;   VIDEO BRONCHOSCOPY N/A 04/12/2013   Procedure: VIDEO BRONCHOSCOPY;  Surgeon: Dallas KATHEE Jude, MD;  Location: [facility] OR;  Service: Thoracic;  Laterality: N/A;    Social History   Socioeconomic History   Marital status: Widowed    Spouse name: Not on file   Number of children: 2   Years of education: College   Highest education level: Master's degree (e.g., MA, MS, MEng, MEd, MSW, MBA)  Occupational History   Occupation: Glass blower/designer: [employer]  Tobacco Use   Smoking  status: Former    Current packs/day: 0.00    Average packs/day: 1 pack/day for 35.0 years (35.0 ttl pk-yrs)    Types: Cigarettes    Start date: 01/06/1953    Quit date: 01/07/1988    Years since quitting: 35.7   Smokeless tobacco: Never  Vaping Use   Vaping status: Never Used  Substance and Sexual Activity   Alcohol use: No    Comment: not since 1988   Drug use: No   Sexual activity: Not Currently  Other Topics Concern   Not on file  Social History Narrative   Not on file   Social Drivers of Health   Financial Resource Strain: Low Risk  (12/06/2019)   Overall Financial Resource Strain (CARDIA)    Difficulty of Paying Living Expenses: Not hard at all  Food Insecurity: No Food Insecurity (12/06/2019)   Hunger Vital Sign  Worried About Programme researcher, broadcasting/film/video in the Last Year: Never true    Ran Out of Food in the Last Year: Never true  Transportation Needs: No Transportation Needs (12/06/2019)   PRAPARE - Administrator, Civil Service (Medical): No    Lack of Transportation (Non-Medical): No  Physical Activity: Inactive (12/06/2019)   Exercise Vital Sign    Days of Exercise per Week: 0 days    Minutes of Exercise per Session: 0 min  Stress: No Stress Concern Present (12/06/2019)   [employer] of Occupational Health - Occupational Stress Questionnaire    Feeling of Stress : Not at all  Social Connections: Moderately Integrated (12/06/2019)   Social Connection and Isolation Panel    Frequency of Communication with Friends and Family: More than three times a week    Frequency of Social Gatherings with Friends and Family: More than three times a week    Attends Religious Services: More than 4 times per year    Active Member of [employer] or Organizations: Yes    Attends Banker Meetings: 1 to 4 times per year    Marital Status: Widowed  Intimate Partner Violence: Not At Risk (12/06/2019)   Humiliation, Afraid, Rape, and Kick questionnaire    Fear of  Current or Ex-Partner: No    Emotionally Abused: No    Physically Abused: No    Sexually Abused: No    Family History  Problem Relation Age of Onset   Lung cancer Mother    Coronary artery disease Son    Heart attack Son 12   Arthritis/Rheumatoid Maternal Grandmother    Coronary artery disease Son    Arthritis/Rheumatoid Son     ROS: no fevers or chills, productive cough, hemoptysis, dysphasia, odynophagia, melena, hematochezia, dysuria, hematuria, rash, seizure activity, orthopnea, PND, pedal edema, claudication. Remaining systems are negative.  Physical Exam: Well-developed well-nourished in no acute distress.  Skin is warm and dry.  HEENT is normal.  Neck is supple.  Chest is clear to auscultation with normal expansion.  Cardiovascular exam is regular rate and rhythm.  2/6 systolic murmur left sternal border. Abdominal exam nontender or distended. No masses palpated. Extremities show no edema. neuro grossly intact   A/P  1 nonischemic cardiomyopathy-LV function low normal on most recent study.  Continue ARB.  Beta-blocker discontinued previously secondary to bradycardia.  2 hyperlipidemia-continue statin.  3 chronic combined systolic/diastolic congestive heart failure-patient is euvolemic today.  Will continue diuretic as needed.  4 hypertension-blood pressure is controlled.  Continue present medications.  5 prior CVA-continue Plavix .  6 history of chronic dyspnea-this is felt to be multifactorial including history of lung cancer status post resection, obesity hypoventilation syndrome, COPD and sleep apnea.  7 peripheral vascular disease  Redell Shallow, MD

## 2023-09-29 ENCOUNTER — Encounter: Payer: Self-pay | Admitting: Physical Therapy

## 2023-09-29 ENCOUNTER — Ambulatory Visit: Admitting: Physical Therapy

## 2023-09-29 DIAGNOSIS — R278 Other lack of coordination: Secondary | ICD-10-CM

## 2023-09-29 DIAGNOSIS — R2689 Other abnormalities of gait and mobility: Secondary | ICD-10-CM

## 2023-09-29 DIAGNOSIS — M6281 Muscle weakness (generalized): Secondary | ICD-10-CM

## 2023-09-29 NOTE — Therapy (Signed)
 OUTPATIENT PHYSICAL THERAPY FEMALE PELVIC TREATMENT   Patient Name: Taylor Berry MRN: [id number] DOB:[birth year], 83 y.o., female Today's Date: 09/29/2023  END OF SESSION:  PT End of Session - 09/29/23 1107     Visit Number 12    Date for Recertification  10/26/23    Authorization Type [employer]    Authorization Time Period med necessity    Progress Note Due on Visit 10    PT Start Time 1103    PT Stop Time 1145    PT Time Calculation (min) 42 min    Activity Tolerance Patient tolerated treatment well    Behavior During Therapy WFL for tasks assessed/performed                Past Medical History:  Diagnosis Date   Abnormal CT scan    Allergic rhinitis    Anemia 2015   after lung surgery   Angio-edema    Complication of anesthesia    Fentanyl  allergy, claustrophobia   COPD (chronic obstructive pulmonary disease) (HCC)    Depression    Diaphoresis 07/30/2015   Excessive sweating with little exertion   Difficult intravenous access    GERD (gastroesophageal reflux disease)    In the past   Hyperlipidemia    Hypertension    Left bundle branch block    Lung cancer (HCC) 2015   left upper lobe wedge removed-no further tx.   Neuropathy    Tingling in arms, fingers, and feet (Since 06/15/16)   Obesity    OSA (obstructive sleep apnea)    denies.   Osteoarthritis    ra also   Osteoporosis    Recurrent upper respiratory infection (URI)    Rheumatoid arthritis(714.0)    Dr. Mai   SOB (shortness of breath) on exertion    Uterine cancer (HCC) 1971   tx surgical   Past Surgical History:  Procedure Laterality Date   ABDOMINAL HYSTERECTOMY     in situ carcinoma partial   CARDIAC CATHETERIZATION  01/27/11   minor non-obs CAD, NL EF, mild pulm HTN   CATARACT EXTRACTION, BILATERAL     last done 12'16-   COLONOSCOPY W/ BIOPSIES AND POLYPECTOMY     COLONOSCOPY WITH PROPOFOL  N/A 02/13/2015   Procedure: COLONOSCOPY WITH PROPOFOL ;  Surgeon: Lynwood Bohr, MD;  Location: [facility];  Service: Endoscopy;  Laterality: N/A;   ESOPHAGOGASTRODUODENOSCOPY (EGD) WITH PROPOFOL  N/A 09/30/2016   Procedure: ESOPHAGOGASTRODUODENOSCOPY (EGD) WITH PROPOFOL ;  Surgeon: Bohr Lynwood, MD;  Location: [facility];  Service: Endoscopy;  Laterality: N/A;   LYMPH NODE DISSECTION Left 04/12/2013   Procedure: LYMPH NODE DISSECTION;  Surgeon: Dallas KATHEE Jude, MD;  Location: [facility] OR;  Service: Thoracic;  Laterality: Left;   RIGHT/LEFT HEART CATH AND CORONARY ANGIOGRAPHY N/A 10/08/2016   Procedure: RIGHT/LEFT HEART CATH AND CORONARY ANGIOGRAPHY;  Surgeon: Verlin Lonni BIRCH, MD;  Location: [facility];  Service: Cardiovascular;  Laterality: N/A;   TONSILLECTOMY     VESICOVAGINAL FISTULA CLOSURE W/ TAH  1971   VIDEO ASSISTED THORACOSCOPY (VATS)/THOROCOTOMY Left 04/12/2013   Procedure: VIDEO ASSISTED THORACOSCOPY (VATS)/THOROCOTOMY, WITH LEFT UPPER LOBE WEDGE RESECTION, CHEST WALL BIOPSY ;  Surgeon: Dallas KATHEE Jude, MD;  Location: [facility] OR;  Service: Thoracic;  Laterality: Left;   VIDEO BRONCHOSCOPY N/A 04/12/2013   Procedure: VIDEO BRONCHOSCOPY;  Surgeon: Dallas KATHEE Jude, MD;  Location: [name] OR;  Service: Thoracic;  Laterality: N/A;   Patient Active Problem List   Diagnosis Date Noted   Impacted cerumen  of right ear 01/17/2023   Dizziness 01/17/2023   Central perforation of tympanic membrane of right ear 01/17/2023   Other specified disorders of eustachian tube, right ear 01/17/2023   Hepatic lesion 12/20/2021   Chronic systolic heart failure (HCC)    Bradycardia 05/23/2021   Postural dizziness with presyncope 05/23/2021   Acute prerenal azotemia 05/23/2021   Cerebral thrombosis with cerebral infarction 01/02/2021   Encephalopathy acute/expressive aphasia  12/30/2020   Hyponatremia 12/30/2020   GERD (gastroesophageal reflux disease) 12/30/2020   Hypocalcemia 12/30/2020   Acute on chronic combined systolic and diastolic CHF (congestive heart failure)  (HCC) 04/28/2019   non obstructive CAD 04/28/2019   Edema 04/14/2019   Abnormal CT of the chest 03/22/2018   Acid reflux 07/13/2017   Affective psychosis 07/13/2017   Alcohol dependence in remission (HCC) 07/13/2017   BD (Bowen's disease) 07/13/2017   History of colon polyps 07/13/2017   Rheumatoid arthritis (HCC) 07/13/2017   Chronic bronchitis (HCC) 07/13/2017   Secondary pulmonary hypertension 07/13/2017   Obstructive apnea 07/13/2017   Age related osteoporosis 07/13/2017   Non-ischemic cardiomyopathy/diastolic and systolic CHF    Hyperhidrosis 93/86/7981   Anemia in neoplastic disease 05/02/2013   Allergic rhinitis 04/29/2013   Depression 04/29/2013   Lung cancer, left upper lobe 02/03/2013   Pulmonary infiltrates 01/05/2013   Mild pulmonary hypertension (HCC) 02/17/2011   OSA (obstructive sleep apnea) 02/17/2011   Dyspnea on exertion 11/22/2010   COPD (chronic obstructive pulmonary disease) (HCC) 07/20/2009   Carcinoma in situ of cervix uteri 06/26/2009   Pure hypercholesterolemia 06/26/2009   OBESITY 06/26/2009   Chronic disease anemia 06/26/2009   Essential (primary) hypertension 06/26/2009   ARTHRITIS, RHEUMATOID 06/26/2009   OSTEOPOROSIS 06/26/2009   Cough 06/26/2009      PCP: Leonel Cole, MD  REFERRING PROVIDER: Legrand Victory LITTIE DOUGLAS, MD   REFERRING DIAG: R15.9 (ICD-10-CM) - Incontinence of feces, unspecified fecal incontinence type   THERAPY DIAG:  Muscle weakness (generalized)  Other abnormalities of gait and mobility  Other lack of coordination  Rationale for Evaluation and Treatment: Rehabilitation  ONSET DATE: pt reports 5 years  SUBJECTIVE:                                                                                                                                                                                           SUBJECTIVE STATEMENT: Patient reports that she felt good after last visit. No fecal accidents since last visit.  Still takes  some immodium      Last session: Patient reports that she had a couple accidents last week due to eating sweet potatoes. Her exercises are going OK.  Fluid intake: coffee, water  PAIN: no pain  PRECAUTIONS: None  RED FLAGS: None   WEIGHT BEARING RESTRICTIONS: No  FALLS:  Has patient fallen in last 6 months? No  OCCUPATION: retired  ACTIVITY LEVEL : not very active as far as exercise since her stroke, drives, goes to appts, goes to the grocery store, plays violin in 2 symphonies, is sure that she knows not being so active has something to do with this   PLOF: Independent with household mobility with device  PATIENT GOALS: to be well, to able to stand up and walk right and not have fecal incontinence, it is frustrating   PERTINENT HISTORY:  Hysterectomy, CHF, COPD Sexual abuse: No  BOWEL MOVEMENT: Sometimes it will happen when she walks around the grocery store, accidents are random, sometimes it just happens, sometimes when she eats something spicy, unpredictable Pain with bowel movement: No Type of bowel movement:Type (Bristol Stool Scale) 3 or 6, when it is 6 she might have an accident, he just changed her medication Fully empty rectum: No- sometimes she does not Leakage: Yes:   Pads: Yes: all the time Fiber supplement/laxative Yes Metamucil- 2 capsules/ day. Will start Immodium tonight  URINATION: has to get up in the night, when she lies down and sits up, she does not have the strength to hold it Pain with urination: No Fully empty bladder: Yes:   Stream: Strong Urgency: Yes  Frequency: when she is going from lying down to sitting up, she leaks, during the day, it's a little bit of leaks, her and there Leakage: Urge to void, Walking to the bathroom, Coughing, and Sneezing Pads: Yes: 2- 3 / day  INTERCOURSE: not active   PREGNANCY: Vaginal deliveries 2- breech and 1 premature, almost died during the delivery Tearing No Episiotomy Yes  C-section  deliveries 0 Currently pregnant No  PROLAPSE: None   OBJECTIVE:  Note: Objective measures were completed at Evaluation unless otherwise noted.   PATIENT SURVEYS:    PFIQ-7: 119  COGNITION: Overall cognitive status: Within functional limits for tasks assessed     SENSATION: Light touch: Appears intact  LUMBAR SPECIAL TESTS:  Single leg stance test: Positive- decreased balance and weakness  GAIT: Assistive device utilized: Single point cane Comments: slow, antalgic  POSTURE: rounded shoulders, forward head, decreased lumbar lordosis, and flexed trunk    LUMBARAROM/PROM: limited throughout  LOWER EXTREMITY MNF:opfpuzi throughout   LOWER EXTREMITY MMT: 4-/5 bilateral hips and knees  PALPATION:   General: difficulty with engaging abdomen and pelvic floor  Pelvic Alignment: even  Abdominal: restrictions throughout abdomen                External Perineal Exam: deferred                              Internal Pelvic Floor: deferred   Patient confirms identification and approves PT to assess internal pelvic floor and treatment No  PELVIC MMT:   MMT eval  Vaginal   Internal Anal Sphincter   External Anal Sphincter   Puborectalis   Diastasis Recti no  (Blank rows = not tested)        TONE: Deferred   PROLAPSE: Deferred   TODAY'S TREATMENT:  DATE: 09/29/2023 Nu step 10 mins  with therapist present to discuss progress Seated hip adduction with ball 2x10 with foam Seated hip abd with green theraband 2x10 Chest press 2lb seated with transverse abdominis breath  Bicep curls #2 20 reps    09/23/2023 Nu step 12 mins with therapist present to discuss progress Leg press with transverse abdominis breath 30 reps 30 lbs STS 15 reps #3 with foam #3 bicep curls and punches 10x2 bilateral # 2 diagonal shoulder raises 12 reps [employer]  carry #3   09/04/2023 Nu step 10 mins with therapist present to discuss progress STS with transverse abdominis breath  15 reps off chair and foam pad Leg press with transverse abdominis breath 30 lbs 30 reps Seated ball press with transverse abdominis breath 20 reps bilateral Hip adduction seated  with transverse abdominis breath with ball 2x10      08/28/2023 Nu step 10 mins with therapist present to discuss progress STS with #2 15 reps  Seated rowing red theraband with transverse abdominis breath 2x10 Seated ball press with transverse abdominis breath  15 reps Ball squeeze seated 10 reps Chest press with #2 15 reps   08/11/2023 Nu step 12 mins for global strengthening There acts- review of bowel irritants Transverse abdominis breath with chest press 20 reps #2 #1 lb pronation/ supination bilateral 20 rep with diaphragmatic breathing  Seated ball press with transverse abdominis breath 10 reps for pelvic floor and abdominal activation     07/27/2023 There ex- NU step  10 mins Leg press- 40 lbs There acts- review of fiber, squatty potty, healthy bowel recommendations     06/29/2023 Neuro reed- Nu step- Nu step- 8 mins- with therapist present to discuss progress Leg press- 40 lbs with transverse abdominis breath 30 reps Arm bike - 2 mins forward and 3 mins backwards Hip adduction with ball with transverse abdominis breath 20 reps    06/16/2023   Neuro reed- Nu step- Nu step- 14 mins- with therapist present to discuss progress                     [employer] with transverse abdominis breath  Hip adduction with ball with transverse abdominis breath  with thera band hold Ball press into table bilateral 10 reps       Neuro reed- ball press with pelvic floor lift, with transverse abdominis breath Hip adduction with ball  Sit to stand with transverse abdominis breath and pelvic floor contraction with red theraband holding Nustep 5:45 mins  Arm bike 2+2 mins Supine to  sit with transverse abdominis breath and  PF contraction   PATIENT EDUCATION:  Education details: HEP, expectations of PT, exam findings Person educated: Patient Education method: Explanation, Demonstration, Tactile cues, Verbal cues, and Handouts Education comprehension: verbalized understanding, returned demonstration, verbal cues required, tactile cues required, and needs further education  HOME EXERCISE PROGRAM:  [id number]  ASSESSMENT:  CLINICAL IMPRESSION: Patient was seen today for treatment of fecal incontinence and urinary incontinence. Patient with limited endurance, core, global weakness and decreased mobility. Patient did well with exercises today. We discussed progress, HEP and recommended improved consistency with HEP. Treatment session focused on exercise to improve core strength and hip strengthening and core coordination. Patient had minimal difficulty with nu step. She reported that she had salmonella poisoning and a fecal accident from that a couple of weeks ago. She was fatigued today from some yard work.  Imodium is helping, she is not constipated, takes 1/2 pill /  day. Reports being regular. Patient is progressing slowly towards goals and will benefit from continued PT to address deficits, reduce fecal incontinence and improve quality of life.  Limited by her COPD and difficulty with breathing and weakness.       OBJECTIVE IMPAIRMENTS: Abnormal gait, cardiopulmonary status limiting activity, decreased activity tolerance, decreased balance, decreased cognition, decreased coordination, decreased mobility, difficulty walking, decreased ROM, decreased strength, hypomobility, impaired flexibility, impaired tone, postural dysfunction, and pain.   ACTIVITY LIMITATIONS: carrying, lifting, bending, sitting, standing, squatting, sleeping, stairs, transfers, continence, toileting, reach over head, locomotion level, and caring for others  PARTICIPATION LIMITATIONS: meal prep,  cleaning, laundry, personal finances, driving, shopping, community activity, occupation, yard work, and church  PERSONAL FACTORS: Age, Fitness, Time since onset of injury/illness/exacerbation, and 3+ comorbidities: CHF, COPD, rheumatoid arthritis are also affecting patient's functional outcome.   REHAB POTENTIAL: Fair    CLINICAL DECISION MAKING: Evolving/moderate complexity  EVALUATION COMPLEXITY: Moderate   GOALS: Goals reviewed with patient? Yes  SHORT TERM GOALS: Target date: 06/01/2023    Pt will be independent with HEP.   Baseline: Goal status: progressing  2.  Pt will be able to stand at least 10 mins without increased hip, knee and back pain Baseline:  Goal status: progressing ( back hurts)  3.  Pt will report reducing fecal accidents to at most once/ week Baseline:  Goal status: met  4.  Pt will report reducing urinary leaking to max  once/ week Baseline:  Goal status: not yet   LONG TERM GOALS: Target date: 07/27/2023 POC update 10/26/2023  Pt will be independent with advanced HEP.   Baseline:  Goal status: progressing  2.  Pt will be independent with use of squatty potty, relaxed toileting mechanics, and improved bowel movement techniques in order to increase ease of bowel movements and complete evacuation.   Baseline:  Goal status: partially met  3.  Pt will be able to functional actions such as transfer in bed from supine to sit  without leakage  Baseline:  Goal status: progressing  4.  Pt will have no fecal or urinary accidents/ week to be able to participate in community activities Baseline:  Goal status: progressing   PLAN:  PT FREQUENCY: 1-2x/week  PT DURATION: 12 weeks  PLANNED INTERVENTIONS: 97110-Therapeutic exercises, 97530- Therapeutic activity, 97112- Neuromuscular re-education, 97535- Self Care, [id number]- Manual therapy, [phone]- Gait training, [phone]- Electrical stimulation (manual), Patient/Family education, Stair training, Taping, Dry  Needling, Joint mobilization, Joint manipulation, Spinal manipulation, Spinal mobilization, Scar mobilization, Cryotherapy, Moist heat, and Biofeedback  PLAN FOR NEXT SESSION: cont exercises for strengthening   Jolin Benavides, PT 09/29/2023, 11:08 AM

## 2023-09-30 ENCOUNTER — Encounter: Payer: Self-pay | Admitting: Cardiology

## 2023-09-30 ENCOUNTER — Ambulatory Visit: Attending: Cardiology | Admitting: Cardiology

## 2023-09-30 VITALS — BP 138/77 | HR 73 | Ht 62.0 in | Wt 150.6 lb

## 2023-09-30 DIAGNOSIS — I10 Essential (primary) hypertension: Secondary | ICD-10-CM

## 2023-09-30 DIAGNOSIS — E7800 Pure hypercholesterolemia, unspecified: Secondary | ICD-10-CM | POA: Diagnosis not present

## 2023-09-30 DIAGNOSIS — I428 Other cardiomyopathies: Secondary | ICD-10-CM | POA: Diagnosis not present

## 2023-09-30 NOTE — Patient Instructions (Signed)

## 2023-10-12 DIAGNOSIS — H6123 Impacted cerumen, bilateral: Secondary | ICD-10-CM | POA: Diagnosis not present

## 2023-10-15 ENCOUNTER — Encounter: Payer: Self-pay | Admitting: Physical Therapy

## 2023-10-15 ENCOUNTER — Ambulatory Visit: Attending: Gastroenterology | Admitting: Physical Therapy

## 2023-10-15 DIAGNOSIS — R278 Other lack of coordination: Secondary | ICD-10-CM | POA: Diagnosis not present

## 2023-10-15 DIAGNOSIS — M6281 Muscle weakness (generalized): Secondary | ICD-10-CM | POA: Diagnosis not present

## 2023-10-15 DIAGNOSIS — R2689 Other abnormalities of gait and mobility: Secondary | ICD-10-CM | POA: Insufficient documentation

## 2023-10-15 NOTE — Therapy (Signed)
 OUTPATIENT PHYSICAL THERAPY FEMALE PELVIC TREATMENT   Patient Name: Taylor Berry MRN: [id number] DOB:[birth year], 83 y.o., female Today's Date: 10/15/2023  END OF SESSION:  PT End of Session - 10/15/23 1406     Visit Number 13    Date for Recertification  10/26/23    Authorization Type [employer]    Authorization Time Period med necessity    Progress Note Due on Visit 10    PT Start Time 1400    PT Stop Time 1440    PT Time Calculation (min) 40 min    Activity Tolerance Patient tolerated treatment well    Behavior During Therapy WFL for tasks assessed/performed                Past Medical History:  Diagnosis Date   Abnormal CT scan    Allergic rhinitis    Anemia 2015   after lung surgery   Angio-edema    Complication of anesthesia    Fentanyl  allergy, claustrophobia   COPD (chronic obstructive pulmonary disease) (HCC)    Depression    Diaphoresis 07/30/2015   Excessive sweating with little exertion   Difficult intravenous access    GERD (gastroesophageal reflux disease)    In the past   Hyperlipidemia    Hypertension    Left bundle branch block    Lung cancer (HCC) 2015   left upper lobe wedge removed-no further tx.   Neuropathy    Tingling in arms, fingers, and feet (Since 06/15/16)   Obesity    OSA (obstructive sleep apnea)    denies.   Osteoarthritis    ra also   Osteoporosis    Recurrent upper respiratory infection (URI)    Rheumatoid arthritis(714.0)    Dr. Mai   SOB (shortness of breath) on exertion    Uterine cancer (HCC) 1971   tx surgical   Past Surgical History:  Procedure Laterality Date   ABDOMINAL HYSTERECTOMY     in situ carcinoma partial   CARDIAC CATHETERIZATION  01/27/11   minor non-obs CAD, NL EF, mild pulm HTN   CATARACT EXTRACTION, BILATERAL     last done 12'16-   COLONOSCOPY W/ BIOPSIES AND POLYPECTOMY     COLONOSCOPY WITH PROPOFOL  N/A 02/13/2015   Procedure: COLONOSCOPY WITH PROPOFOL ;  Surgeon: Lynwood Bohr, MD;  Location: [facility];  Service: Endoscopy;  Laterality: N/A;   ESOPHAGOGASTRODUODENOSCOPY (EGD) WITH PROPOFOL  N/A 09/30/2016   Procedure: ESOPHAGOGASTRODUODENOSCOPY (EGD) WITH PROPOFOL ;  Surgeon: Bohr Lynwood, MD;  Location: [facility];  Service: Endoscopy;  Laterality: N/A;   LYMPH NODE DISSECTION Left 04/12/2013   Procedure: LYMPH NODE DISSECTION;  Surgeon: Dallas KATHEE Jude, MD;  Location: [facility] OR;  Service: Thoracic;  Laterality: Left;   RIGHT/LEFT HEART CATH AND CORONARY ANGIOGRAPHY N/A 10/08/2016   Procedure: RIGHT/LEFT HEART CATH AND CORONARY ANGIOGRAPHY;  Surgeon: Verlin Lonni BIRCH, MD;  Location: [facility];  Service: Cardiovascular;  Laterality: N/A;   TONSILLECTOMY     VESICOVAGINAL FISTULA CLOSURE W/ TAH  1971   VIDEO ASSISTED THORACOSCOPY (VATS)/THOROCOTOMY Left 04/12/2013   Procedure: VIDEO ASSISTED THORACOSCOPY (VATS)/THOROCOTOMY, WITH LEFT UPPER LOBE WEDGE RESECTION, CHEST WALL BIOPSY ;  Surgeon: Dallas KATHEE Jude, MD;  Location: [facility] OR;  Service: Thoracic;  Laterality: Left;   VIDEO BRONCHOSCOPY N/A 04/12/2013   Procedure: VIDEO BRONCHOSCOPY;  Surgeon: Dallas KATHEE Jude, MD;  Location: [facility] OR;  Service: Thoracic;  Laterality: N/A;   Patient Active Problem List   Diagnosis Date Noted   Impacted cerumen  of right ear 01/17/2023   Dizziness 01/17/2023   Central perforation of tympanic membrane of right ear 01/17/2023   Other specified disorders of eustachian tube, right ear 01/17/2023   Hepatic lesion 12/20/2021   Chronic systolic heart failure (HCC)    Bradycardia 05/23/2021   Postural dizziness with presyncope 05/23/2021   Acute prerenal azotemia 05/23/2021   Cerebral thrombosis with cerebral infarction 01/02/2021   Encephalopathy acute/expressive aphasia  12/30/2020   Hyponatremia 12/30/2020   GERD (gastroesophageal reflux disease) 12/30/2020   Hypocalcemia 12/30/2020   Acute on chronic combined systolic and diastolic CHF (congestive heart failure)  (HCC) 04/28/2019   non obstructive CAD 04/28/2019   Edema 04/14/2019   Abnormal CT of the chest 03/22/2018   Acid reflux 07/13/2017   Affective psychosis 07/13/2017   Alcohol dependence in remission (HCC) 07/13/2017   BD (Bowen's disease) 07/13/2017   History of colon polyps 07/13/2017   Rheumatoid arthritis (HCC) 07/13/2017   Chronic bronchitis (HCC) 07/13/2017   Secondary pulmonary hypertension 07/13/2017   Obstructive apnea 07/13/2017   Age related osteoporosis 07/13/2017   Non-ischemic cardiomyopathy/diastolic and systolic CHF    Hyperhidrosis 93/86/7981   Anemia in neoplastic disease 05/02/2013   Allergic rhinitis 04/29/2013   Depression 04/29/2013   Lung cancer, left upper lobe 02/03/2013   Pulmonary infiltrates 01/05/2013   Mild pulmonary hypertension (HCC) 02/17/2011   OSA (obstructive sleep apnea) 02/17/2011   Dyspnea on exertion 11/22/2010   COPD (chronic obstructive pulmonary disease) (HCC) 07/20/2009   Carcinoma in situ of cervix uteri 06/26/2009   Pure hypercholesterolemia 06/26/2009   OBESITY 06/26/2009   Chronic disease anemia 06/26/2009   Essential (primary) hypertension 06/26/2009   ARTHRITIS, RHEUMATOID 06/26/2009   OSTEOPOROSIS 06/26/2009   Cough 06/26/2009      PCP: Leonel Cole, MD  REFERRING PROVIDER: Legrand Victory LITTIE DOUGLAS, MD   REFERRING DIAG: R15.9 (ICD-10-CM) - Incontinence of feces, unspecified fecal incontinence type   THERAPY DIAG:  Other abnormalities of gait and mobility  Muscle weakness (generalized)  Other lack of coordination  Rationale for Evaluation and Treatment: Rehabilitation  ONSET DATE: pt reports 5 years  SUBJECTIVE:                                                                                                                                                                                           SUBJECTIVE STATEMENT: Patient reports that she feels OK. Felt like she had a workout last session. Just runs out of energy.   Gets out of breath. 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  Has some occasional issues with her GI. Does not  know if she ate anything irritating Was stressed last night and could not walk well after sitting for 2 hours in an uncomfortable chair, had a little fecal accident after that. 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      Fluid intake: coffee, water  PAIN: no pain  PRECAUTIONS: None  RED FLAGS: None   WEIGHT BEARING RESTRICTIONS: No  FALLS:  Has patient fallen in last 6 months? No  OCCUPATION: retired  ACTIVITY LEVEL : not very active as far as exercise since her stroke, drives, goes to appts, goes to the grocery store, plays violin in 2 symphonies, is sure that she knows not being so active has something to do with this   PLOF: Independent with household mobility with device  PATIENT GOALS: to be well, to able to stand up and walk right and not have fecal incontinence, it is frustrating   PERTINENT HISTORY:  Hysterectomy, CHF, COPD Sexual abuse: No  BOWEL MOVEMENT: Sometimes it will happen when she walks around the grocery store, accidents are random, sometimes it just happens, sometimes when she eats something spicy, unpredictable Pain with bowel movement: No Type of bowel movement:Type (Bristol Stool Scale) 3 or 6, when it is 6 she might have an accident, he just changed her medication Fully empty rectum: No- sometimes she does not Leakage: Yes:   Pads: Yes: all the time Fiber supplement/laxative Yes Metamucil- 2 capsules/ day. Will start Immodium tonight  URINATION: has to get up in the night, when she lies down and sits up, she does not have the strength to hold it Pain with urination: No Fully empty bladder: Yes:   Stream: Strong Urgency: Yes  Frequency: when she is going from lying down to sitting up, she leaks, during the day, it's a little bit of leaks, her and there Leakage: Urge to void, Walking to the  bathroom, Coughing, and Sneezing Pads: Yes: 2- 3 / day  INTERCOURSE: not active   PREGNANCY: Vaginal deliveries 2- breech and 1 premature, almost died during the delivery Tearing No Episiotomy Yes  C-section deliveries 0 Currently pregnant No  PROLAPSE: None   OBJECTIVE:  Note: Objective measures were completed at Evaluation unless otherwise noted.   PATIENT SURVEYS:    PFIQ-7: 119  COGNITION: Overall cognitive status: Within functional limits for tasks assessed     SENSATION: Light touch: Appears intact  LUMBAR SPECIAL TESTS:  Single leg stance test: Positive- decreased balance and weakness  GAIT: Assistive device utilized: Single point cane Comments: slow, antalgic  POSTURE: rounded shoulders, forward head, decreased lumbar lordosis, and flexed trunk    LUMBARAROM/PROM: limited throughout  LOWER EXTREMITY MNF:opfpuzi throughout   LOWER EXTREMITY MMT: 4-/5 bilateral hips and knees  PALPATION:   General: difficulty with engaging abdomen and pelvic floor  Pelvic Alignment: even  Abdominal: restrictions throughout abdomen                External Perineal Exam: deferred                              Internal Pelvic Floor: deferred   Patient confirms identification and approves PT to assess internal pelvic floor and treatment No  PELVIC MMT:   MMT eval  Vaginal   Internal Anal Sphincter   External Anal Sphincter   Puborectalis   Diastasis Recti no  (Blank rows = not tested)        TONE: Deferred   PROLAPSE: Deferred  TODAY'S TREATMENT:                                                                                                                              DATE: 10/15/2023 Stationary bike 10 mins with therapist present to discuss progress O2 check with pulse ox throughout session Seated ball press with transverse abdominis breath 4x10 For pelvic floor and abdominal cocontraction Leg press 20 reps 30 lbs  PATIENT EDUCATION:   Education details: HEP, expectations of PT, exam findings Person educated: Patient Education method: Explanation, Demonstration, Tactile cues, Verbal cues, and Handouts Education comprehension: verbalized understanding, returned demonstration, verbal cues required, tactile cues required, and needs further education  HOME EXERCISE PROGRAM:  [id number]  ASSESSMENT:  CLINICAL IMPRESSION: O2 sats 89 sats, HR 55 beats/ min after 10 mins of stationary bike  Patient was seen today for treatment of fecal incontinence and urinary incontinence. Patient with limited endurance, core, global weakness and decreased mobility. Patient did well with exercises today. We discussed progress, HEP and recommended improved consistency with HEP. Treatment session focused on exercise to improve core strength and hip strengthening and core coordination. Patient had some difficulty getting on stationary bike. SABRA She was fatigued today, O2 sats 89 post biking. Discussed monitoring O2 sats at home and reaching out to her pulmonologist possibly for supplemental oxygen  ( she had lung cancer in 2015 and has COPD). Imodium is helping, she is not constipated, takes 1/2 pill / day. Patient is progressing slowly towards goals and will benefit from continued PT to address deficits, reduce fecal incontinence and improve quality of life.  Limited by her COPD and difficulty with breathing and weakness.       OBJECTIVE IMPAIRMENTS: Abnormal gait, cardiopulmonary status limiting activity, decreased activity tolerance, decreased balance, decreased cognition, decreased coordination, decreased mobility, difficulty walking, decreased ROM, decreased strength, hypomobility, impaired flexibility, impaired tone, postural dysfunction, and pain.   ACTIVITY LIMITATIONS: carrying, lifting, bending, sitting, standing, squatting, sleeping, stairs, transfers, continence, toileting, reach over head, locomotion level, and caring for  others  PARTICIPATION LIMITATIONS: meal prep, cleaning, laundry, personal finances, driving, shopping, community activity, occupation, yard work, and church  PERSONAL FACTORS: Age, Fitness, Time since onset of injury/illness/exacerbation, and 3+ comorbidities: CHF, COPD, rheumatoid arthritis are also affecting patient's functional outcome.   REHAB POTENTIAL: Fair    CLINICAL DECISION MAKING: Evolving/moderate complexity  EVALUATION COMPLEXITY: Moderate   GOALS: Goals reviewed with patient? Yes  SHORT TERM GOALS: Target date: 06/01/2023    Pt will be independent with HEP.   Baseline: Goal status: progressing  2.  Pt will be able to stand at least 10 mins without increased hip, knee and back pain Baseline:  Goal status: progressing ( back hurts)  3.  Pt will report reducing fecal accidents to at most once/ week Baseline:  Goal status: met  4.  Pt will report reducing urinary leaking to max  once/ week Baseline:  Goal status: not yet   LONG  TERM GOALS: Target date: 07/27/2023 POC updated to 10/26/2023  Pt will be independent with advanced HEP.   Baseline:  Goal status: progressing  2.  Pt will be independent with use of squatty potty, relaxed toileting mechanics, and improved bowel movement techniques in order to increase ease of bowel movements and complete evacuation.   Baseline:  Goal status: partially met  3.  Pt will be able to functional actions such as transfer in bed from supine to sit  without leakage  Baseline:  Goal status: progressing  4.  Pt will have no fecal or urinary accidents/ week to be able to participate in community activities Baseline:  Goal status: progressing   PLAN:  PT FREQUENCY: 1-2x/week  PT DURATION: 12 weeks  PLANNED INTERVENTIONS: 97110-Therapeutic exercises, 97530- Therapeutic activity, 97112- Neuromuscular re-education, 97535- Self Care, [id number]- Manual therapy, [phone]- Gait training, [phone]- Electrical stimulation (manual),  Patient/Family education, Stair training, Taping, Dry Needling, Joint mobilization, Joint manipulation, Spinal manipulation, Spinal mobilization, Scar mobilization, Cryotherapy, Moist heat, and Biofeedback  PLAN FOR NEXT SESSION: cont exercises for strengthening   Donnesha Karg, PT 10/15/2023, 2:07 PM

## 2023-10-21 ENCOUNTER — Encounter: Admitting: Physical Therapy

## 2023-10-26 ENCOUNTER — Other Ambulatory Visit: Payer: Self-pay

## 2023-10-26 ENCOUNTER — Other Ambulatory Visit: Payer: Self-pay | Admitting: Cardiology

## 2023-10-26 ENCOUNTER — Other Ambulatory Visit (HOSPITAL_COMMUNITY): Payer: Self-pay

## 2023-10-26 MED ORDER — ATORVASTATIN CALCIUM 40 MG PO TABS
40.0000 mg | ORAL_TABLET | Freq: Every day | ORAL | 3 refills | Status: AC
  Filled 2023-10-26: qty 90, 90d supply, fill #0
  Filled 2024-01-27: qty 90, 90d supply, fill #1

## 2023-10-27 ENCOUNTER — Telehealth: Payer: Self-pay

## 2023-10-27 NOTE — Telephone Encounter (Signed)
 Copied from CRM #[id number]. Topic: Clinical - Medical Advice >> Oct 27, 2023  1:04 PM Taylor Berry wrote: Reason for CRM: Patient requesting to speak with Dr.Byrums' nurse in regard to infromation for an application for assistance for [employer] drug manufacturer.   Callback number: [phone]   Spoke with patient placed new forms up front  for her to fill out and sign + add copy of insurance card / cost of payment of INH paper work    Provider will sign when back in clinic  ( Please place in [employer] )    Paper work signed an faxed in w/ Rx -  Copy of fax confirmation mailed to patient

## 2023-10-29 ENCOUNTER — Other Ambulatory Visit: Payer: Self-pay

## 2023-10-29 MED ORDER — TRELEGY ELLIPTA 100-62.5-25 MCG/ACT IN AEPB: 1.0000 | INHALATION_SPRAY | Freq: Every day | RESPIRATORY_TRACT | 11 refills | Status: DC

## 2023-10-29 NOTE — Addendum Note (Signed)
 Addended by: ARMAND BURNARD SAUNDERS on: 10/29/2023 04:36 PM   Modules accepted: Orders, Level of Service

## 2023-10-29 NOTE — Progress Notes (Signed)
 This encounter was created in error - please disregard.

## 2023-11-06 ENCOUNTER — Other Ambulatory Visit (HOSPITAL_COMMUNITY): Payer: Self-pay

## 2023-11-06 ENCOUNTER — Other Ambulatory Visit (HOSPITAL_BASED_OUTPATIENT_CLINIC_OR_DEPARTMENT_OTHER): Payer: Self-pay

## 2023-11-06 ENCOUNTER — Other Ambulatory Visit: Payer: Self-pay

## 2023-11-06 DIAGNOSIS — M0589 Other rheumatoid arthritis with rheumatoid factor of multiple sites: Secondary | ICD-10-CM | POA: Diagnosis not present

## 2023-11-06 MED ORDER — LEFLUNOMIDE 20 MG PO TABS
20.0000 mg | ORAL_TABLET | Freq: Every day | ORAL | 1 refills | Status: AC
  Filled 2023-11-06: qty 90, 90d supply, fill #0
  Filled 2024-02-10: qty 90, 90d supply, fill #1

## 2023-11-09 ENCOUNTER — Other Ambulatory Visit (HOSPITAL_COMMUNITY): Payer: Self-pay

## 2023-11-09 ENCOUNTER — Ambulatory Visit: Admitting: Physical Therapy

## 2023-11-09 DIAGNOSIS — M5459 Other low back pain: Secondary | ICD-10-CM | POA: Diagnosis not present

## 2023-11-09 DIAGNOSIS — M25552 Pain in left hip: Secondary | ICD-10-CM | POA: Diagnosis not present

## 2023-11-09 MED ORDER — METHYLPREDNISOLONE 4 MG PO TBPK
ORAL_TABLET | ORAL | 0 refills | Status: DC
  Filled 2023-11-09: qty 21, 6d supply, fill #0

## 2023-11-16 NOTE — Therapy (Signed)
 OUTPATIENT PHYSICAL THERAPY THORACOLUMBAR EVALUATION   Patient Name: Taylor Berry MRN: [id number] DOB:1940/04/03, 83 y.o., female Today's Date: 11/17/2023  END OF SESSION:  PT End of Session - 11/17/23 1314     Visit Number 1    Date for Recertification  01/12/23    Authorization Type [employer]    Progress Note Due on Visit 10    PT Start Time 1018    PT Stop Time 1102    PT Time Calculation (min) 44 min    Activity Tolerance Patient tolerated treatment well    Behavior During Therapy WFL for tasks assessed/performed          Past Medical History:  Diagnosis Date   Abnormal CT scan    Allergic rhinitis    Anemia 2015   after lung surgery   Angio-edema    Complication of anesthesia    Fentanyl  allergy, claustrophobia   COPD (chronic obstructive pulmonary disease) (HCC)    Depression    Diaphoresis 07/30/2015   Excessive sweating with little exertion   Difficult intravenous access    GERD (gastroesophageal reflux disease)    In the past   Hyperlipidemia    Hypertension    Left bundle branch block    Lung cancer (HCC) 2015   left upper lobe wedge removed-no further tx.   Neuropathy    Tingling in arms, fingers, and feet (Since 06/15/16)   Obesity    OSA (obstructive sleep apnea)    denies.   Osteoarthritis    ra also   Osteoporosis    Recurrent upper respiratory infection (URI)    Rheumatoid arthritis(714.0)    Dr. Mai   SOB (shortness of breath) on exertion    Uterine cancer (HCC) 1971   tx surgical   Past Surgical History:  Procedure Laterality Date   ABDOMINAL HYSTERECTOMY     in situ carcinoma partial   CARDIAC CATHETERIZATION  01/27/11   minor non-obs CAD, NL EF, mild pulm HTN   CATARACT EXTRACTION, BILATERAL     last done 12'16-   COLONOSCOPY W/ BIOPSIES AND POLYPECTOMY     COLONOSCOPY WITH PROPOFOL  N/A 02/13/2015   Procedure: COLONOSCOPY WITH PROPOFOL ;  Surgeon: Lynwood Bohr, MD;  Location: [facility];  Service: Endoscopy;   Laterality: N/A;   ESOPHAGOGASTRODUODENOSCOPY (EGD) WITH PROPOFOL  N/A 09/30/2016   Procedure: ESOPHAGOGASTRODUODENOSCOPY (EGD) WITH PROPOFOL ;  Surgeon: Bohr Lynwood, MD;  Location: [facility];  Service: Endoscopy;  Laterality: N/A;   LYMPH NODE DISSECTION Left 04/12/2013   Procedure: LYMPH NODE DISSECTION;  Surgeon: Dallas KATHEE Jude, MD;  Location: [facility] OR;  Service: Thoracic;  Laterality: Left;   RIGHT/LEFT HEART CATH AND CORONARY ANGIOGRAPHY N/A 10/08/2016   Procedure: RIGHT/LEFT HEART CATH AND CORONARY ANGIOGRAPHY;  Surgeon: Verlin Lonni BIRCH, MD;  Location: [facility];  Service: Cardiovascular;  Laterality: N/A;   TONSILLECTOMY     VESICOVAGINAL FISTULA CLOSURE W/ TAH  1971   VIDEO ASSISTED THORACOSCOPY (VATS)/THOROCOTOMY Left 04/12/2013   Procedure: VIDEO ASSISTED THORACOSCOPY (VATS)/THOROCOTOMY, WITH LEFT UPPER LOBE WEDGE RESECTION, CHEST WALL BIOPSY ;  Surgeon: Dallas KATHEE Jude, MD;  Location: [facility] OR;  Service: Thoracic;  Laterality: Left;   VIDEO BRONCHOSCOPY N/A 04/12/2013   Procedure: VIDEO BRONCHOSCOPY;  Surgeon: Dallas KATHEE Jude, MD;  Location: [facility] OR;  Service: Thoracic;  Laterality: N/A;   Patient Active Problem List   Diagnosis Date Noted   Impacted cerumen of right ear 01/17/2023   Dizziness 01/17/2023   Central perforation of tympanic membrane of  right ear 01/17/2023   Other specified disorders of eustachian tube, right ear 01/17/2023   Hepatic lesion 12/20/2021   Chronic systolic heart failure (HCC)    Bradycardia 05/23/2021   Postural dizziness with presyncope 05/23/2021   Acute prerenal azotemia 05/23/2021   Cerebral thrombosis with cerebral infarction 01/02/2021   Encephalopathy acute/expressive aphasia  12/30/2020   Hyponatremia 12/30/2020   GERD (gastroesophageal reflux disease) 12/30/2020   Hypocalcemia 12/30/2020   Acute on chronic combined systolic and diastolic CHF (congestive heart failure) (HCC) 04/28/2019   non obstructive CAD 04/28/2019   Edema  04/14/2019   Abnormal CT of the chest 03/22/2018   Acid reflux 07/13/2017   Affective psychosis 07/13/2017   Alcohol dependence in remission (HCC) 07/13/2017   BD (Bowen's disease) 07/13/2017   History of colon polyps 07/13/2017   Rheumatoid arthritis (HCC) 07/13/2017   Chronic bronchitis (HCC) 07/13/2017   Secondary pulmonary hypertension 07/13/2017   Obstructive apnea 07/13/2017   Age related osteoporosis 07/13/2017   Non-ischemic cardiomyopathy/diastolic and systolic CHF    Hyperhidrosis 93/86/7981   Anemia in neoplastic disease 05/02/2013   Allergic rhinitis 04/29/2013   Depression 04/29/2013   Lung cancer, left upper lobe 02/03/2013   Pulmonary infiltrates 01/05/2013   Mild pulmonary hypertension (HCC) 02/17/2011   OSA (obstructive sleep apnea) 02/17/2011   Dyspnea on exertion 11/22/2010   COPD (chronic obstructive pulmonary disease) (HCC) 07/20/2009   Carcinoma in situ of cervix uteri 06/26/2009   Pure hypercholesterolemia 06/26/2009   OBESITY 06/26/2009   Chronic disease anemia 06/26/2009   Essential (primary) hypertension 06/26/2009   ARTHRITIS, RHEUMATOID 06/26/2009   OSTEOPOROSIS 06/26/2009   Cough 06/26/2009    PCP: Leonel Cole, MD   REFERRING PROVIDER: Leonel Cole, MD   REFERRING DIAG: M54.59 (ICD-10-CM) - Other low back pain  Rationale for Evaluation and Treatment: Rehabilitation  THERAPY DIAG:  Other low back pain  Pain in left leg  Muscle weakness (generalized)  Abnormal posture  Repeated falls  ONSET DATE: October 2025  SUBJECTIVE:                                                                                                                                                                                           SUBJECTIVE STATEMENT: Patient presents with low back pain and left leg/ knee pain that began a month ago when she fell out of the bed. She reports fracturing her tailbone due to this fall. Limiting functional activities: walking >  10 mins, grocery shopping (leans on the cart), standing long enough to cut vegetables, sitting tolerance. She leans to the right while she sits to off load pressure on tailbone  PERTINENT HISTORY:  Depression; HTN; Hx lung cancer; Neuropathy; RA; COPD; CHF; Scoliosis  PAIN:  Are you having pain? Yes: NPRS scale: 4-5(currently) 5-6(worst) /10 Pain location: tailbone; left lumbar spine; left leg Pain description: constant  Aggravating factors: see above Relieving factors: laying down  PRECAUTIONS: None  RED FLAGS: None and Cauda equina syndrome: No   WEIGHT BEARING RESTRICTIONS: No  FALLS:  Has patient fallen in last 6 months? Yes. Number of falls 1 fall . PT to work on balance  LIVING ENVIRONMENT: Lives with: lives with their son Lives in: House/apartment Stairs: Yes: External: 3 steps; on right going up Has following equipment at home: Single point cane, Environmental Consultant - 4 wheeled, shower chair, and Grab bars  OCCUPATION: Retired   PLOF: Independent, Independent with basic ADLs, Independent with household mobility with device, Independent with community mobility with device, Independent with gait, Independent with transfers, and Leisure: play violin    PATIENT GOALS: To stop the hurting and have no pain  NEXT MD VISIT: Dec 11th  OBJECTIVE:  Note: Objective measures were completed at Evaluation unless otherwise noted.  DIAGNOSTIC FINDINGS:  None  PATIENT SURVEYS:  Modified Oswestry: 33/50 66%  Minimally Clinically Important Difference (MCID) = 12.8%  COGNITION: Overall cognitive status: Within functional limits for tasks assessed     SENSATION: WFL  MUSCLE LENGTH: Hamstrings: decreased bilateral    POSTURE: rounded shoulders, forward head, and increased thoracic kyphosis   LUMBAR ROM:   AROM eval  Flexion Bends to shins  Extension 50% limited  Right lateral flexion Bends to knee joint line  Left lateral flexion Bends to knee joint line  Right rotation 30%  limited  Left rotation 30% limited   (Blank rows = not tested)  LOWER EXTREMITY ROM:   WFL bilateral    LOWER EXTREMITY MMT:    MMT Right eval Left eval  Hip flexion 4- 3+  Hip extension    Hip abduction 4- 4-  Hip adduction 4- 4-  Hip internal rotation    Hip external rotation    Knee flexion 4 4-  Knee extension 4 4-  Ankle dorsiflexion    Ankle plantarflexion    Ankle inversion    Ankle eversion     (Blank rows = not tested)    FUNCTIONAL TESTS:  5 times sit to stand: 22.76 sec with UE support Timed up and go (TUG): 20.24 sec with str cane  GAIT: Distance walked: 61ft Assistive device utilized: Single point cane Level of assistance: Modified independence Comments: decreased cadence  TREATMENT DATE:  11/17/2023 Initial Evaluation & HEP created                                                                                                                                 PATIENT EDUCATION:  Education details: PT eval findings, anticipated POC, progress with PT, and initial HEP Person educated: Patient Education method: Explanation, Demonstration, and Handouts Education comprehension: verbalized  understanding, returned demonstration, and needs further education  HOME EXERCISE PROGRAM: Access Code: [email or web address] URL: [email or web address] Date: 11/17/2023 Prepared by: Kristeen Sar  Exercises - Seated Quadratus Lumborum Stretch in Chair  - 1 x daily - 7 x weekly - 5 sets - 5s hold - Seated Transversus Abdominis Bracing  - 1 x daily - 7 x weekly - 2 sets - 10 reps - Seated Long Arc Quad  - 1 x daily - 7 x weekly - 2 sets - 10 reps - 3-4s hold - Standing March with Counter Support  - 1 x daily - 7 x weekly - 1 sets - 10 reps - Standing Heel Raise with Support  - 1 x daily - 7 x weekly - 2 sets - 10 reps  ASSESSMENT:  CLINICAL IMPRESSION: Patient is a 83 y.o. female who was seen today for physical therapy evaluation and treatment for low back pain  and left leg pain. Kellee presents to skilled therapy with increased pain after having a fall a month ago . She reports fracturing her tailbone and due to that her sitting tolerance is limited. When sitting for prolonged periods of time she has to offload pressure on her left side. Due to her pain her standing and walking tolerance is limited to 10 min max. Based on evaluation noted muscle weakness, decreased ROM, and poor posture. Patient has multiple comorbidities which impact physical therapy tolerance and progress. Patient is highly motivated and wants to be able to play violin with no difficulty and have no pain. Patient will benefit from skilled PT to address the below impairments and improve overall function.   OBJECTIVE IMPAIRMENTS: Abnormal gait, decreased activity tolerance, decreased balance, decreased mobility, difficulty walking, decreased ROM, decreased strength, hypomobility, increased muscle spasms, impaired flexibility, postural dysfunction, and pain.   ACTIVITY LIMITATIONS: carrying, lifting, bending, sitting, standing, squatting, sleeping, bed mobility, bathing, dressing, hygiene/grooming, and locomotion level  PARTICIPATION LIMITATIONS: meal prep, cleaning, laundry, driving, shopping, and community activity  PERSONAL FACTORS: Age, Fitness, and 3+ comorbidities: Depression; HTN; Neuropathy; RA; COPD; CHF; Scoliosis are also affecting patient's functional outcome.   REHAB POTENTIAL: Fair due to multiple co-morbidities   CLINICAL DECISION MAKING: Evolving/moderate complexity  EVALUATION COMPLEXITY: High   GOALS: Goals reviewed with patient? Yes  SHORT TERM GOALS: Target date: 12/15/2023  Patient will be independent with initial HEP. Baseline:  Goal status: INITIAL  2.  Patient will report > or = to 30% improvement in low back pain and tailbone pain since starting PT. Baseline:  Goal status: INITIAL  3.  Patient will be able to stand for at least 15 minutes for improved  ability to make meals. Baseline: 10 mins Goal status: INITIAL   LONG TERM GOALS: Target date: 01/12/2024  Patient will demonstrate independence in advanced HEP. Baseline:  Goal status: INITIAL  2.  Patient will report > or = to 50% improvement in low back pain and tailbone pain since starting PT. Baseline:  Goal status: INITIAL  3.  Patient will be able to grocery shop with LRAD with  < or = to 3/10 back pain.  Baseline:  Goal status: INITIAL  4.  Patient will perform TUG in < or = to 17 sec to decrease falls risk. Baseline: 20.24 sec Goal status: INITIAL  5.  Patient will perform 5STS in < or = to 19 sec due to improved LE strength and functional mobility.  Baseline: 22.76 sec Goal status: INITIAL  6.  Patient will be able to walk for at  least 6 minutes for improved community negotiation and ability to negotiate grocery store. Baseline:  Goal status: INITIAL  PLAN:  PT FREQUENCY: 1-2x/week  PT DURATION: 8 weeks  PLANNED INTERVENTIONS: [id number]- PT Re-evaluation, 97110-Therapeutic exercises, 97530- Therapeutic activity, [id number]- Neuromuscular re-education, 97535- Self Care, [id number]- Manual therapy, [id number]- Gait training, [phone]- Canalith repositioning, [id number]- Aquatic Therapy, [phone]- Splinting, [id number]- Electrical stimulation (unattended), [phone]- Electrical stimulation (manual), [id number]- Vasopneumatic device, [id number]- Ultrasound, [id number]- Traction (mechanical), [id number]- Ionotophoresis 4mg /ml Dexamethasone , 79439 (1-2 muscles), [id number] (3+ muscles)- Dry Needling, Patient/Family education, Balance training, Stair training, Taping, Joint mobilization, Joint manipulation, Spinal manipulation, Spinal mobilization, Scar mobilization, Compression bandaging, Vestibular training, Cryotherapy, and Moist heat.  PLAN FOR NEXT SESSION: Review HEP; LE strengthening; core activation; balance assessment; NuStep; Hooklying mobility if patient is able; possible aquatics if patient does not tolerate land well (did not  discuss this with patient yet)   Kristeen Sar, PT, DPT 11/17/23 1:16 PM [facility] [address], [address] [place], [place] [address] Phone # [phone] Fax [phone]

## 2023-11-17 ENCOUNTER — Encounter: Payer: Self-pay | Admitting: Physical Therapy

## 2023-11-17 ENCOUNTER — Ambulatory Visit: Attending: Orthopedic Surgery | Admitting: Physical Therapy

## 2023-11-17 ENCOUNTER — Other Ambulatory Visit: Payer: Self-pay

## 2023-11-17 DIAGNOSIS — M79605 Pain in left leg: Secondary | ICD-10-CM | POA: Diagnosis not present

## 2023-11-17 DIAGNOSIS — R293 Abnormal posture: Secondary | ICD-10-CM | POA: Insufficient documentation

## 2023-11-17 DIAGNOSIS — M5459 Other low back pain: Secondary | ICD-10-CM | POA: Insufficient documentation

## 2023-11-17 DIAGNOSIS — M6281 Muscle weakness (generalized): Secondary | ICD-10-CM | POA: Insufficient documentation

## 2023-11-17 DIAGNOSIS — R296 Repeated falls: Secondary | ICD-10-CM | POA: Diagnosis not present

## 2023-11-23 ENCOUNTER — Other Ambulatory Visit: Payer: Self-pay | Admitting: Cardiology

## 2023-11-23 NOTE — Therapy (Signed)
 OUTPATIENT PHYSICAL THERAPY THORACOLUMBAR TREATMENT   Patient Name: Taylor Berry MRN: [id number] DOB:September 28, 1940, 83 y.o., female Today's Date: 11/24/2023  END OF SESSION:  PT End of Session - 11/24/23 0934     Visit Number 2    Date for Recertification  01/12/23    Authorization Type [employer]    Authorization Time Period med necessity    Progress Note Due on Visit 10    PT Start Time 0934    PT Stop Time 1013    PT Time Calculation (min) 39 min    Activity Tolerance Patient tolerated treatment well    Behavior During Therapy [facility] for tasks assessed/performed           Past Medical History:  Diagnosis Date   Abnormal CT scan    Allergic rhinitis    Anemia 2015   after lung surgery   Angio-edema    Complication of anesthesia    Fentanyl  allergy, claustrophobia   COPD (chronic obstructive pulmonary disease) (HCC)    Depression    Diaphoresis 07/30/2015   Excessive sweating with little exertion   Difficult intravenous access    GERD (gastroesophageal reflux disease)    In the past   Hyperlipidemia    Hypertension    Left bundle branch block    Lung cancer (HCC) 2015   left upper lobe wedge removed-no further tx.   Neuropathy    Tingling in arms, fingers, and feet (Since 06/15/16)   Obesity    OSA (obstructive sleep apnea)    denies.   Osteoarthritis    ra also   Osteoporosis    Recurrent upper respiratory infection (URI)    Rheumatoid arthritis(714.0)    Dr. Mai   SOB (shortness of breath) on exertion    Uterine cancer (HCC) 1971   tx surgical   Past Surgical History:  Procedure Laterality Date   ABDOMINAL HYSTERECTOMY     in situ carcinoma partial   CARDIAC CATHETERIZATION  01/27/11   minor non-obs CAD, NL EF, mild pulm HTN   CATARACT EXTRACTION, BILATERAL     last done 12'16-   COLONOSCOPY W/ BIOPSIES AND POLYPECTOMY     COLONOSCOPY WITH PROPOFOL  N/A 02/13/2015   Procedure: COLONOSCOPY WITH PROPOFOL ;  Surgeon: Lynwood Bohr, MD;   Location: [facility];  Service: Endoscopy;  Laterality: N/A;   ESOPHAGOGASTRODUODENOSCOPY (EGD) WITH PROPOFOL  N/A 09/30/2016   Procedure: ESOPHAGOGASTRODUODENOSCOPY (EGD) WITH PROPOFOL ;  Surgeon: Bohr Lynwood, MD;  Location: [facility];  Service: Endoscopy;  Laterality: N/A;   LYMPH NODE DISSECTION Left 04/12/2013   Procedure: LYMPH NODE DISSECTION;  Surgeon: Dallas KATHEE Jude, MD;  Location: [facility] OR;  Service: Thoracic;  Laterality: Left;   RIGHT/LEFT HEART CATH AND CORONARY ANGIOGRAPHY N/A 10/08/2016   Procedure: RIGHT/LEFT HEART CATH AND CORONARY ANGIOGRAPHY;  Surgeon: Verlin Lonni BIRCH, MD;  Location: [facility];  Service: Cardiovascular;  Laterality: N/A;   TONSILLECTOMY     VESICOVAGINAL FISTULA CLOSURE W/ TAH  1971   VIDEO ASSISTED THORACOSCOPY (VATS)/THOROCOTOMY Left 04/12/2013   Procedure: VIDEO ASSISTED THORACOSCOPY (VATS)/THOROCOTOMY, WITH LEFT UPPER LOBE WEDGE RESECTION, CHEST WALL BIOPSY ;  Surgeon: Dallas KATHEE Jude, MD;  Location: [facility] OR;  Service: Thoracic;  Laterality: Left;   VIDEO BRONCHOSCOPY N/A 04/12/2013   Procedure: VIDEO BRONCHOSCOPY;  Surgeon: Dallas KATHEE Jude, MD;  Location: [facility] OR;  Service: Thoracic;  Laterality: N/A;   Patient Active Problem List   Diagnosis Date Noted   Impacted cerumen of right ear 01/17/2023   Dizziness  01/17/2023   Central perforation of tympanic membrane of right ear 01/17/2023   Other specified disorders of eustachian tube, right ear 01/17/2023   Hepatic lesion 12/20/2021   Chronic systolic heart failure (HCC)    Bradycardia 05/23/2021   Postural dizziness with presyncope 05/23/2021   Acute prerenal azotemia 05/23/2021   Cerebral thrombosis with cerebral infarction 01/02/2021   Encephalopathy acute/expressive aphasia  12/30/2020   Hyponatremia 12/30/2020   GERD (gastroesophageal reflux disease) 12/30/2020   Hypocalcemia 12/30/2020   Acute on chronic combined systolic and diastolic CHF (congestive heart failure) (HCC)  04/28/2019   non obstructive CAD 04/28/2019   Edema 04/14/2019   Abnormal CT of the chest 03/22/2018   Acid reflux 07/13/2017   Affective psychosis 07/13/2017   Alcohol dependence in remission (HCC) 07/13/2017   BD (Bowen's disease) 07/13/2017   History of colon polyps 07/13/2017   Rheumatoid arthritis (HCC) 07/13/2017   Chronic bronchitis (HCC) 07/13/2017   Secondary pulmonary hypertension 07/13/2017   Obstructive apnea 07/13/2017   Age related osteoporosis 07/13/2017   Non-ischemic cardiomyopathy/diastolic and systolic CHF    Hyperhidrosis 93/86/7981   Anemia in neoplastic disease 05/02/2013   Allergic rhinitis 04/29/2013   Depression 04/29/2013   Lung cancer, left upper lobe 02/03/2013   Pulmonary infiltrates 01/05/2013   Mild pulmonary hypertension (HCC) 02/17/2011   OSA (obstructive sleep apnea) 02/17/2011   Dyspnea on exertion 11/22/2010   COPD (chronic obstructive pulmonary disease) (HCC) 07/20/2009   Carcinoma in situ of cervix uteri 06/26/2009   Pure hypercholesterolemia 06/26/2009   OBESITY 06/26/2009   Chronic disease anemia 06/26/2009   Essential (primary) hypertension 06/26/2009   ARTHRITIS, RHEUMATOID 06/26/2009   OSTEOPOROSIS 06/26/2009   Cough 06/26/2009    PCP: Leonel Cole, MD   REFERRING PROVIDER: Leonel Cole, MD   REFERRING DIAG: M54.59 (ICD-10-CM) - Other low back pain  Rationale for Evaluation and Treatment: Rehabilitation  THERAPY DIAG:  Other low back pain  Pain in left leg  Muscle weakness (generalized)  Abnormal posture  ONSET DATE: October 2025  SUBJECTIVE:                                                                                                                                                                                           SUBJECTIVE STATEMENT: My pain is not good. I see the doctor Monday. More in the knees. The left is the worse. The tailbone has improved.   EVAL: Patient presents with low back pain and left  leg/ knee pain that began a month ago when she fell out of the bed. She reports fracturing her tailbone due to this fall. Limiting functional activities: walking >  10 mins, grocery shopping (leans on the cart), standing long enough to cut vegetables, sitting tolerance. She leans to the right while she sits to off load pressure on tailbone   PERTINENT HISTORY:  Depression; HTN; Hx lung cancer; Neuropathy; RA; COPD; CHF; Scoliosis  PAIN:  Are you having pain? Yes: NPRS scale: 6(currently) 5-6(worst) /10 Pain location: b knees Pain description: constant  Aggravating factors: see above Relieving factors: laying down  PRECAUTIONS: None  RED FLAGS: None and Cauda equina syndrome: No   WEIGHT BEARING RESTRICTIONS: No  FALLS:  Has patient fallen in last 6 months? Yes. Number of falls 1 fall . PT to work on balance  LIVING ENVIRONMENT: Lives with: lives with their son Lives in: House/apartment Stairs: Yes: External: 3 steps; on right going up Has following equipment at home: Single point cane, Environmental Consultant - 4 wheeled, shower chair, and Grab bars  OCCUPATION: Retired   PLOF: Independent, Independent with basic ADLs, Independent with household mobility with device, Independent with community mobility with device, Independent with gait, Independent with transfers, and Leisure: play violin    PATIENT GOALS: To stop the hurting and have no pain  NEXT MD VISIT: Dec 11th  OBJECTIVE:  Note: Objective measures were completed at Evaluation unless otherwise noted.  DIAGNOSTIC FINDINGS:  None  PATIENT SURVEYS:  Modified Oswestry: 33/50 66%  Minimally Clinically Important Difference (MCID) = 12.8%  COGNITION: Overall cognitive status: Within functional limits for tasks assessed     SENSATION: WFL  MUSCLE LENGTH: Hamstrings: decreased bilateral    POSTURE: rounded shoulders, forward head, and increased thoracic kyphosis   LUMBAR ROM:   AROM eval  Flexion Bends to shins  Extension  50% limited  Right lateral flexion Bends to knee joint line  Left lateral flexion Bends to knee joint line  Right rotation 30% limited  Left rotation 30% limited   (Blank rows = not tested)  LOWER EXTREMITY ROM:   WFL bilateral    LOWER EXTREMITY MMT:    MMT Right eval Left eval  Hip flexion 4- 3+  Hip extension    Hip abduction 4- 4-  Hip adduction 4- 4-  Hip internal rotation    Hip external rotation    Knee flexion 4 4-  Knee extension 4 4-  Ankle dorsiflexion    Ankle plantarflexion    Ankle inversion    Ankle eversion     (Blank rows = not tested)    FUNCTIONAL TESTS:  5 times sit to stand: 22.76 sec with UE support Timed up and go (TUG): 20.24 sec with str cane  GAIT: Distance walked: 21ft Assistive device utilized: Single point cane Level of assistance: Modified independence Comments: decreased cadence  TREATMENT DATE:  11/23/23 LAQ x 10 B Standing march x 10 B Seated fig 4 2 x 30 sec B Seated QL stretch 5 sec hold x 3 Seated TA x 5 5 sec hold LTR x 3 B SKTC 3 x 20 sec B Bridge 2 x 10 (sets separated by quad sets and SAQ) Supine quad set 5 sec hold x 10 B SAQ x 10 B over green noodle (uncomfortable) Discussion of MD appt/schedule change   11/17/2023 Initial Evaluation & HEP created  PATIENT EDUCATION:  Education details: HEP update Person educated: Patient Education method: Explanation, Demonstration, and Handouts Education comprehension: verbalized understanding, returned demonstration, and needs further education  HOME EXERCISE PROGRAM: Access Code: [email or web address] URL: [email or web address] Date: 11/17/2023 Prepared by: Kristeen Sar  Exercises - Seated Quadratus Lumborum Stretch in Chair  - 1 x daily - 7 x weekly - 5 sets - 5s hold - Seated Transversus Abdominis Bracing  - 1 x daily - 7 x weekly - 2  sets - 10 reps - Seated Long Arc Quad  - 1 x daily - 7 x weekly - 2 sets - 10 reps - 3-4s hold - Standing March with Counter Support  - 1 x daily - 7 x weekly - 1 sets - 10 reps - Standing Heel Raise with Support  - 1 x daily - 7 x weekly - 2 sets - 10 reps  ASSESSMENT:  CLINICAL IMPRESSION: Patient reporting ongoing knee pain today and states she is seeing Dr. Arnaldo on Monday. She tolerated most exercises well without increased pain in the coccyx or knees. Good response to stretches. Updated HEP. Postponed balance assessment until after MD appointment to see if knee pain decreases.    Eval: Patient is a 83 y.o. female who was seen today for physical therapy evaluation and treatment for low back pain and left leg pain. Rmoni presents to skilled therapy with increased pain after having a fall a month ago . She reports fracturing her tailbone and due to that her sitting tolerance is limited. When sitting for prolonged periods of time she has to offload pressure on her left side. Due to her pain her standing and walking tolerance is limited to 10 min max. Based on evaluation noted muscle weakness, decreased ROM, and poor posture. Patient has multiple comorbidities which impact physical therapy tolerance and progress. Patient is highly motivated and wants to be able to play violin with no difficulty and have no pain. Patient will benefit from skilled PT to address the below impairments and improve overall function.   OBJECTIVE IMPAIRMENTS: Abnormal gait, decreased activity tolerance, decreased balance, decreased mobility, difficulty walking, decreased ROM, decreased strength, hypomobility, increased muscle spasms, impaired flexibility, postural dysfunction, and pain.   ACTIVITY LIMITATIONS: carrying, lifting, bending, sitting, standing, squatting, sleeping, bed mobility, bathing, dressing, hygiene/grooming, and locomotion level  PARTICIPATION LIMITATIONS: meal prep, cleaning, laundry, driving,  shopping, and community activity  PERSONAL FACTORS: Age, Fitness, and 3+ comorbidities: Depression; HTN; Neuropathy; RA; COPD; CHF; Scoliosis are also affecting patient's functional outcome.   REHAB POTENTIAL: Fair due to multiple co-morbidities   CLINICAL DECISION MAKING: Evolving/moderate complexity  EVALUATION COMPLEXITY: High   GOALS: Goals reviewed with patient? Yes  SHORT TERM GOALS: Target date: 12/15/2023  Patient will be independent with initial HEP. Baseline:  Goal status: INITIAL  2.  Patient will report > or = to 30% improvement in low back pain and tailbone pain since starting PT. Baseline:  Goal status: INITIAL  3.  Patient will be able to stand for at least 15 minutes for improved ability to make meals. Baseline: 10 mins Goal status: INITIAL   LONG TERM GOALS: Target date: 01/12/2024  Patient will demonstrate independence in advanced HEP. Baseline:  Goal status: INITIAL  2.  Patient will report > or = to 50% improvement in low back pain and tailbone pain since starting PT. Baseline:  Goal status: INITIAL  3.  Patient will be able to grocery shop with LRAD with  < or = to 3/10 back pain.  Baseline:  Goal status: INITIAL  4.  Patient will perform TUG in < or = to 17 sec to decrease falls risk. Baseline: 20.24 sec Goal status: INITIAL  5.  Patient will perform 5STS in < or = to 19 sec due to improved LE strength and functional mobility.  Baseline: 22.76 sec Goal status: INITIAL  6.  Patient will be able to walk for at least 6 minutes for improved community negotiation and ability to negotiate grocery store. Baseline:  Goal status: INITIAL  PLAN:  PT FREQUENCY: 1-2x/week  PT DURATION: 8 weeks  PLANNED INTERVENTIONS: 97164- PT Re-evaluation, 97110-Therapeutic exercises, 97530- Therapeutic activity, [id number]- Neuromuscular re-education, 97535- Self Care, 02859- Manual therapy, [id number]- Gait training, [phone]- Canalith repositioning, [id number]- Aquatic Therapy,  [phone]- Splinting, H9716- Electrical stimulation (unattended), [phone]- Electrical stimulation (manual), [id number]- Vasopneumatic device, [id number]- Ultrasound, [id number]- Traction (mechanical), [id number]- Ionotophoresis 4mg /ml Dexamethasone , [id number] (1-2 muscles), [id number] (3+ muscles)- Dry Needling, Patient/Family education, Balance training, Stair training, Taping, Joint mobilization, Joint manipulation, Spinal manipulation, Spinal mobilization, Scar mobilization, Compression bandaging, Vestibular training, Cryotherapy, and Moist heat.  PLAN FOR NEXT SESSION: Review HEP; LE strengthening; core activation; balance assessment; NuStep; Hooklying mobility if patient is able; possible aquatics if patient does not tolerate land well (did not discuss this with patient yet)   Mliss Cummins, PT  11/24/23 10:18 AM [facility] [address], [address] [place], [place] [address] Phone # [phone] Fax [phone]

## 2023-11-24 ENCOUNTER — Ambulatory Visit: Admitting: Physical Therapy

## 2023-11-24 ENCOUNTER — Encounter: Payer: Self-pay | Admitting: Physical Therapy

## 2023-11-24 ENCOUNTER — Other Ambulatory Visit: Payer: Self-pay

## 2023-11-24 ENCOUNTER — Other Ambulatory Visit (HOSPITAL_COMMUNITY): Payer: Self-pay

## 2023-11-24 DIAGNOSIS — M79605 Pain in left leg: Secondary | ICD-10-CM

## 2023-11-24 DIAGNOSIS — M5459 Other low back pain: Secondary | ICD-10-CM | POA: Diagnosis not present

## 2023-11-24 DIAGNOSIS — R293 Abnormal posture: Secondary | ICD-10-CM

## 2023-11-24 DIAGNOSIS — M6281 Muscle weakness (generalized): Secondary | ICD-10-CM

## 2023-11-24 MED ORDER — CLOPIDOGREL BISULFATE 75 MG PO TABS
75.0000 mg | ORAL_TABLET | Freq: Every day | ORAL | 3 refills | Status: AC
  Filled 2023-11-24: qty 90, 90d supply, fill #0

## 2023-11-30 ENCOUNTER — Other Ambulatory Visit: Payer: Self-pay

## 2023-11-30 ENCOUNTER — Ambulatory Visit: Admitting: Physical Therapy

## 2023-11-30 ENCOUNTER — Other Ambulatory Visit (HOSPITAL_COMMUNITY): Payer: Self-pay

## 2023-11-30 DIAGNOSIS — M25552 Pain in left hip: Secondary | ICD-10-CM | POA: Diagnosis not present

## 2023-11-30 DIAGNOSIS — M5459 Other low back pain: Secondary | ICD-10-CM | POA: Diagnosis not present

## 2023-11-30 MED ORDER — GABAPENTIN 100 MG PO CAPS
100.0000 mg | ORAL_CAPSULE | Freq: Every evening | ORAL | 1 refills | Status: AC
  Filled 2023-11-30 (×2): qty 60, 20d supply, fill #0

## 2023-11-30 MED ORDER — CELECOXIB 200 MG PO CAPS
200.0000 mg | ORAL_CAPSULE | Freq: Every day | ORAL | 1 refills | Status: AC
  Filled 2023-11-30 (×2): qty 30, 30d supply, fill #0

## 2023-12-01 DIAGNOSIS — Z6824 Body mass index (BMI) 24.0-24.9, adult: Secondary | ICD-10-CM | POA: Diagnosis not present

## 2023-12-01 DIAGNOSIS — D72818 Other decreased white blood cell count: Secondary | ICD-10-CM | POA: Diagnosis not present

## 2023-12-01 DIAGNOSIS — M1991 Primary osteoarthritis, unspecified site: Secondary | ICD-10-CM | POA: Diagnosis not present

## 2023-12-01 DIAGNOSIS — M0589 Other rheumatoid arthritis with rheumatoid factor of multiple sites: Secondary | ICD-10-CM | POA: Diagnosis not present

## 2023-12-01 DIAGNOSIS — R531 Weakness: Secondary | ICD-10-CM | POA: Diagnosis not present

## 2023-12-01 DIAGNOSIS — M25552 Pain in left hip: Secondary | ICD-10-CM | POA: Diagnosis not present

## 2023-12-01 DIAGNOSIS — M06322 Rheumatoid nodule, left elbow: Secondary | ICD-10-CM | POA: Diagnosis not present

## 2023-12-01 DIAGNOSIS — R682 Dry mouth, unspecified: Secondary | ICD-10-CM | POA: Diagnosis not present

## 2023-12-01 DIAGNOSIS — Z79899 Other long term (current) drug therapy: Secondary | ICD-10-CM | POA: Diagnosis not present

## 2023-12-01 DIAGNOSIS — M810 Age-related osteoporosis without current pathological fracture: Secondary | ICD-10-CM | POA: Diagnosis not present

## 2023-12-08 ENCOUNTER — Telehealth: Payer: Self-pay | Admitting: Allergy and Immunology

## 2023-12-08 ENCOUNTER — Ambulatory Visit: Admitting: Physical Therapy

## 2023-12-08 NOTE — Telephone Encounter (Signed)
 Pt called and said she needed more samples of the Fluticasone -Umeclidin-Vilant (TRELEGY ELLIPTA ) 100-62.5-25 MCG/ACT AEPB [[id number]]

## 2023-12-09 ENCOUNTER — Telehealth: Payer: Self-pay

## 2023-12-09 NOTE — Telephone Encounter (Signed)
 Copied from CRM #[id number]. Topic: Clinical - Medication Question >> Dec 08, 2023  9:04 AM Corean SAUNDERS wrote: Reason for CRM: Patient is kindly inquiring about Trellegy 100 mcg samples. Please call back to advise.  I do not see any Trelegy samples in our sample closet. I will route to [facility], RN as she handles samples. She may have some someplace else.

## 2023-12-10 ENCOUNTER — Telehealth: Payer: Self-pay | Admitting: *Deleted

## 2023-12-10 NOTE — Telephone Encounter (Signed)
 Copied from CRM #[id number]. Topic: Clinical - Medication Question >> Dec 08, 2023  9:04 AM Corean SAUNDERS wrote: Reason for CRM: Patient is kindly inquiring about Trellegy 100 mcg samples. Please call back to advise. >> Dec 09, 2023  2:29 PM Corean SAUNDERS wrote: Patient is requesting an update as no one called her back yesterday regarding Trellegy samples.   Per [facility], no more samples of Trelegy. I spoke with the pt and notified her that we are out of samples. I offered to send in rx and she states not to send in right now, she will call back if she changes her mind. I also advised sometimes PCP office will sample inhalers, so she will try and call them to see if they have trelegy. Nothing further needed.

## 2023-12-10 NOTE — Telephone Encounter (Signed)
 duplicate

## 2023-12-11 ENCOUNTER — Other Ambulatory Visit: Payer: Self-pay

## 2023-12-11 ENCOUNTER — Ambulatory Visit: Admitting: Physical Therapy

## 2023-12-11 ENCOUNTER — Telehealth: Payer: Self-pay

## 2023-12-11 ENCOUNTER — Other Ambulatory Visit (HOSPITAL_COMMUNITY): Payer: Self-pay

## 2023-12-11 MED ORDER — TRELEGY ELLIPTA 100-62.5-25 MCG/ACT IN AEPB
1.0000 | INHALATION_SPRAY | Freq: Every day | RESPIRATORY_TRACT | 3 refills | Status: AC
  Filled 2023-12-11: qty 60, 30d supply, fill #0

## 2023-12-11 NOTE — Telephone Encounter (Signed)
 I called the patient about the trelegy. It seems like pulmonary sent it in.

## 2023-12-11 NOTE — Telephone Encounter (Signed)
 Copied from CRM [id number]. Topic: Clinical - Medication Question >> Dec 10, 2023  4:31 PM Taylor Berry wrote: Reason for CRM: Pt would like a prescription for Trellegy 100 mcg. Pt would like the medication to sent to [facility].   Refill sent. Called and informed pt- pt verbalized understanding, NFN.

## 2023-12-12 ENCOUNTER — Other Ambulatory Visit (HOSPITAL_COMMUNITY): Payer: Self-pay

## 2023-12-14 ENCOUNTER — Other Ambulatory Visit: Payer: Self-pay | Admitting: Cardiology

## 2023-12-14 ENCOUNTER — Ambulatory Visit: Admitting: Physical Therapy

## 2023-12-14 DIAGNOSIS — I428 Other cardiomyopathies: Secondary | ICD-10-CM

## 2023-12-14 DIAGNOSIS — I10 Essential (primary) hypertension: Secondary | ICD-10-CM

## 2023-12-15 ENCOUNTER — Other Ambulatory Visit

## 2023-12-15 ENCOUNTER — Inpatient Hospital Stay

## 2023-12-16 ENCOUNTER — Other Ambulatory Visit (HOSPITAL_COMMUNITY): Payer: Self-pay

## 2023-12-16 ENCOUNTER — Ambulatory Visit: Admitting: Podiatry

## 2023-12-16 ENCOUNTER — Other Ambulatory Visit: Payer: Self-pay

## 2023-12-16 MED ORDER — CANDESARTAN CILEXETIL 8 MG PO TABS
8.0000 mg | ORAL_TABLET | Freq: Every day | ORAL | 2 refills | Status: AC
  Filled 2023-12-16: qty 90, 90d supply, fill #0

## 2023-12-17 ENCOUNTER — Other Ambulatory Visit: Payer: Self-pay | Admitting: *Deleted

## 2023-12-17 ENCOUNTER — Inpatient Hospital Stay: Attending: Physician Assistant

## 2023-12-17 DIAGNOSIS — D472 Monoclonal gammopathy: Secondary | ICD-10-CM

## 2023-12-17 DIAGNOSIS — R7401 Elevation of levels of liver transaminase levels: Secondary | ICD-10-CM | POA: Diagnosis not present

## 2023-12-17 DIAGNOSIS — Z801 Family history of malignant neoplasm of trachea, bronchus and lung: Secondary | ICD-10-CM | POA: Insufficient documentation

## 2023-12-17 DIAGNOSIS — D649 Anemia, unspecified: Secondary | ICD-10-CM | POA: Insufficient documentation

## 2023-12-17 DIAGNOSIS — D72819 Decreased white blood cell count, unspecified: Secondary | ICD-10-CM | POA: Diagnosis not present

## 2023-12-17 DIAGNOSIS — Z87891 Personal history of nicotine dependence: Secondary | ICD-10-CM | POA: Insufficient documentation

## 2023-12-17 LAB — CMP (CANCER CENTER ONLY)
ALT: 12 U/L (ref 0–44)
AST: 50 U/L — ABNORMAL HIGH (ref 15–41)
Albumin: 4.1 g/dL (ref 3.5–5.0)
Alkaline Phosphatase: 121 U/L (ref 38–126)
Anion gap: 10 (ref 5–15)
BUN: 21 mg/dL (ref 8–23)
CO2: 25 mmol/L (ref 22–32)
Calcium: 10.3 mg/dL (ref 8.9–10.3)
Chloride: 99 mmol/L (ref 98–111)
Creatinine: 0.84 mg/dL (ref 0.44–1.00)
GFR, Estimated: >60 mL/min (ref >60)
Glucose, Bld: 113 mg/dL — ABNORMAL HIGH (ref 70–99)
Potassium: 4.5 mmol/L (ref 3.5–5.1)
Sodium: 134 mmol/L — ABNORMAL LOW (ref 135–145)
Total Bilirubin: 0.6 mg/dL (ref 0.0–1.2)
Total Protein: 7.4 g/dL (ref 6.5–8.1)

## 2023-12-17 LAB — CBC WITH DIFFERENTIAL (CANCER CENTER ONLY)
Abs Immature Granulocytes: 0.02 K/uL (ref 0.00–0.07)
Basophils Absolute: 0 K/uL (ref 0.0–0.1)
Basophils Relative: 1 %
Eosinophils Absolute: 0.1 K/uL (ref 0.0–0.5)
Eosinophils Relative: 2 %
HCT: 30.5 % — ABNORMAL LOW (ref 36.0–46.0)
Hemoglobin: 9.8 g/dL — ABNORMAL LOW (ref 12.0–15.0)
Immature Granulocytes: 1 %
Lymphocytes Relative: 16 %
Lymphs Abs: 0.7 K/uL (ref 0.7–4.0)
MCH: 29.6 pg (ref 26.0–34.0)
MCHC: 32.1 g/dL (ref 30.0–36.0)
MCV: 92.1 fL (ref 80.0–100.0)
Monocytes Absolute: 0.6 K/uL (ref 0.1–1.0)
Monocytes Relative: 14 %
Neutro Abs: 2.9 K/uL (ref 1.7–7.7)
Neutrophils Relative %: 66 %
Platelet Count: 193 K/uL (ref 150–400)
RBC: 3.31 MIL/uL — ABNORMAL LOW (ref 3.87–5.11)
RDW: 15.6 % — ABNORMAL HIGH (ref 11.5–15.5)
WBC Count: 4.3 K/uL (ref 4.0–10.5)
nRBC: 0 % (ref 0.0–0.2)

## 2023-12-18 ENCOUNTER — Ambulatory Visit: Admitting: Physical Therapy

## 2023-12-18 LAB — KAPPA/LAMBDA LIGHT CHAINS
Kappa free light chain: 35 mg/L — ABNORMAL HIGH (ref 3.3–19.4)
Kappa, lambda light chain ratio: 1.49 (ref 0.26–1.65)
Lambda free light chains: 23.5 mg/L (ref 5.7–26.3)

## 2023-12-21 ENCOUNTER — Ambulatory Visit: Admitting: Family Medicine

## 2023-12-21 LAB — MULTIPLE MYELOMA PANEL, SERUM
Albumin SerPl Elph-Mcnc: 3.6 g/dL (ref 2.9–4.4)
Albumin/Glob SerPl: 1.2 (ref 0.7–1.7)
Alpha 1: 0.3 g/dL (ref 0.0–0.4)
Alpha2 Glob SerPl Elph-Mcnc: 0.9 g/dL (ref 0.4–1.0)
B-Globulin SerPl Elph-Mcnc: 1 g/dL (ref 0.7–1.3)
Gamma Glob SerPl Elph-Mcnc: 0.9 g/dL (ref 0.4–1.8)
Globulin, Total: 3.2 g/dL (ref 2.2–3.9)
IgA: 222 mg/dL (ref 64–422)
IgG (Immunoglobin G), Serum: 1003 mg/dL (ref 586–1602)
IgM (Immunoglobulin M), Srm: 88 mg/dL (ref 26–217)
M Protein SerPl Elph-Mcnc: 0.6 g/dL — ABNORMAL HIGH
Total Protein ELP: 6.8 g/dL (ref 6.0–8.5)

## 2023-12-22 ENCOUNTER — Encounter: Payer: Self-pay | Admitting: Podiatry

## 2023-12-22 ENCOUNTER — Other Ambulatory Visit: Payer: Self-pay

## 2023-12-22 ENCOUNTER — Ambulatory Visit: Admitting: Podiatry

## 2023-12-22 ENCOUNTER — Encounter: Payer: Self-pay | Admitting: Allergy and Immunology

## 2023-12-22 ENCOUNTER — Other Ambulatory Visit (HOSPITAL_COMMUNITY): Payer: Self-pay

## 2023-12-22 ENCOUNTER — Ambulatory Visit: Admitting: Allergy and Immunology

## 2023-12-22 VITALS — BP 106/62 | HR 76 | Temp 98.9°F | Resp 14 | Ht 62.0 in | Wt 150.0 lb

## 2023-12-22 DIAGNOSIS — J4489 Other specified chronic obstructive pulmonary disease: Secondary | ICD-10-CM | POA: Diagnosis not present

## 2023-12-22 DIAGNOSIS — M79609 Pain in unspecified limb: Secondary | ICD-10-CM

## 2023-12-22 DIAGNOSIS — B351 Tinea unguium: Secondary | ICD-10-CM | POA: Diagnosis not present

## 2023-12-22 DIAGNOSIS — J3089 Other allergic rhinitis: Secondary | ICD-10-CM | POA: Diagnosis not present

## 2023-12-22 DIAGNOSIS — Z7901 Long term (current) use of anticoagulants: Secondary | ICD-10-CM | POA: Diagnosis not present

## 2023-12-22 MED ORDER — ALBUTEROL SULFATE HFA 108 (90 BASE) MCG/ACT IN AERS
2.0000 | INHALATION_SPRAY | RESPIRATORY_TRACT | 1 refills | Status: AC | PRN
  Filled 2023-12-22: qty 6.7, 17d supply, fill #0

## 2023-12-22 MED ORDER — FLUTICASONE PROPIONATE 50 MCG/ACT NA SUSP
2.0000 | Freq: Every morning | NASAL | 1 refills | Status: AC
  Filled 2023-12-22: qty 48, 90d supply, fill #0

## 2023-12-22 NOTE — Patient Instructions (Addendum)
°  1.  Continue to treat inflammation of airway:  A. Flonase  -1-2 sprays each nostril 1 time per day B. Trelegy 100 - 1 inhalation 1 time per day.    3.  If needed:   A. Albuterol  - 2 inhalations every 4-6 hours.  4. Return to clinic in 6 months or earlier if problem  5. Influenza = Tamiflu. Covid = Paxlovid/molnupiravir

## 2023-12-22 NOTE — Progress Notes (Signed)
 Subjective: Taylor Berry is a 83 y.o. female patient who presents for follow-up of pre-ulcerative area Requesting to have nails trimmed today.  States doing better. She has rheumatoid arthritis Patient denies any new cramping, numbness, burning or tingling in the legs or feet. She is on Plavix .   Patient Active Problem List   Diagnosis Date Noted   Impacted cerumen of right ear 01/17/2023   Dizziness 01/17/2023   Central perforation of tympanic membrane of right ear 01/17/2023   Other specified disorders of eustachian tube, right ear 01/17/2023   Hepatic lesion 12/20/2021   Chronic systolic heart failure (HCC)    Bradycardia 05/23/2021   Postural dizziness with presyncope 05/23/2021   Acute prerenal azotemia 05/23/2021   Cerebral thrombosis with cerebral infarction 01/02/2021   Encephalopathy acute/expressive aphasia  12/30/2020   Hyponatremia 12/30/2020   GERD (gastroesophageal reflux disease) 12/30/2020   Hypocalcemia 12/30/2020   Acute on chronic combined systolic and diastolic CHF (congestive heart failure) (HCC) 04/28/2019   non obstructive CAD 04/28/2019   Edema 04/14/2019   Abnormal CT of the chest 03/22/2018   Acid reflux 07/13/2017   Affective psychosis 07/13/2017   Alcohol dependence in remission (HCC) 07/13/2017   BD (Bowen's disease) 07/13/2017   History of colon polyps 07/13/2017   Rheumatoid arthritis (HCC) 07/13/2017   Chronic bronchitis (HCC) 07/13/2017   Secondary pulmonary hypertension 07/13/2017   Obstructive apnea 07/13/2017   Age related osteoporosis 07/13/2017   Non-ischemic cardiomyopathy/diastolic and systolic CHF    Hyperhidrosis 93/86/7981   Anemia in neoplastic disease 05/02/2013   Allergic rhinitis 04/29/2013   Depression 04/29/2013   Lung cancer, left upper lobe 02/03/2013   Pulmonary infiltrates 01/05/2013   Mild pulmonary hypertension (HCC) 02/17/2011   OSA (obstructive sleep apnea) 02/17/2011   Dyspnea on exertion 11/22/2010   COPD  (chronic obstructive pulmonary disease) (HCC) 07/20/2009   Carcinoma in situ of cervix uteri 06/26/2009   Pure hypercholesterolemia 06/26/2009   OBESITY 06/26/2009   Chronic disease anemia 06/26/2009   Essential (primary) hypertension 06/26/2009   ARTHRITIS, RHEUMATOID 06/26/2009   OSTEOPOROSIS 06/26/2009   Cough 06/26/2009   Current Outpatient Medications on File Prior to Visit  Medication Sig Dispense Refill   albuterol  (VENTOLIN  HFA) 108 (90 Base) MCG/ACT inhaler Inhale 2 puffs into the lungs every 4 (four) hours as needed for wheezing or shortness of breath. 6.7 g 1   atorvastatin  (LIPITOR) 40 MG tablet Take 1 tablet (40 mg total) by mouth daily. 90 tablet 3   Calcium  Carb-Cholecalciferol (CALCIUM  + D3 PO) Take 1 tablet by mouth daily.     CALCIUM -VITAMIN D  PO Take 1 tablet by mouth daily.     candesartan  (ATACAND ) 8 MG tablet Take 1 tablet (8 mg total) by mouth daily. 90 tablet 2   celecoxib  (CELEBREX ) 200 MG capsule Take 1 capsule (200 mg total) by mouth daily. 30 capsule 1   clopidogrel  (PLAVIX ) 75 MG tablet Take 1 tablet (75 mg total) by mouth daily. 90 tablet 3   Cyanocobalamin  (VITAMIN B12) 1000 MCG TBCR Take 1 tablet by mouth daily.     fluticasone  (FLONASE ) 50 MCG/ACT nasal spray Place 2 sprays into both nostrils every morning. 48 g 1   Fluticasone -Umeclidin-Vilant (TRELEGY ELLIPTA ) 100-62.5-25 MCG/ACT AEPB Inhale 1 puff into the lungs daily. 180 each 1   Fluticasone -Umeclidin-Vilant (TRELEGY ELLIPTA ) 100-62.5-25 MCG/ACT AEPB Inhale 1 puff into the lungs daily. 28 each 3   gabapentin  (NEURONTIN ) 100 MG capsule Take 1-3 capsules (100-300 mg total) by mouth Nightly. 60  capsule 1   loperamide (IMODIUM A-D) 2 MG tablet Take 2 mg by mouth as needed.     meclizine  (ANTIVERT ) 25 MG tablet Take 1 tablet (25 mg total) by mouth up to 3 (three) times daily as needed for vertigo. 20 tablet 0   METAMUCIL FIBER PO Take by mouth See admin instructions. Mix 1 tablespoonful of powder into 4-8  ounces of desired beverage and drink once a day     Multiple Vitamins-Minerals (PRESERVISION AREDS) CAPS Take by mouth.     triamcinolone  ointment (KENALOG ) 0.1 % APPLY ON THE SKIN DAILY AS NEEDED FOR BUG BITES 80 g 1   Fluticasone -Umeclidin-Vilant (TRELEGY ELLIPTA ) 100-62.5-25 MCG/ACT AEPB 2 samples     Fluticasone -Umeclidin-Vilant (TRELEGY ELLIPTA ) 100-62.5-25 MCG/ACT AEPB 2 samples     leflunomide  (ARAVA ) 10 MG tablet Take 1 tablet (10 mg total) by mouth daily. 90 tablet 1   leflunomide  (ARAVA ) 20 MG tablet Take 1 tablet (20 mg total) by mouth daily. (Patient not taking: Reported on 09/02/2023) 90 tablet 1   leflunomide  (ARAVA ) 20 MG tablet Take 1 tablet (20 mg total) by mouth daily. 90 tablet 1   methylPREDNISolone  (MEDROL ) 4 MG TBPK tablet Take by mouth as directed 21 tablet 0   No current facility-administered medications on file prior to visit.   Allergies  Allergen Reactions   Latex Itching and Rash   Metoprolol  Tartrate Other (See Comments) and Shortness Of Breath   Ace Inhibitors Cough   Angiotensin Receptor Blockers Swelling and Other (See Comments)    Tongue swelling   Fentanyl  Other (See Comments)    Hallucinations I go crazy      Guaifenesin  Er Diarrhea    Other Reaction(s): Not available   Hydrochlorothiazide Other (See Comments)    Decreased Sodium   Hydroxychloroquine Other (See Comments)    Decreased sodium   Penicillamine Nausea And Vomiting   Penicillins Other (See Comments)    Yeast infections Has patient had a PCN reaction causing immediate rash, facial/tongue/throat swelling, SOB or lightheadedness with hypotension:No Has patient had a PCN reaction causing severe rash involving mucus membranes or skin necrosis: No Has patient had a PCN reaction that required hospitalization No Has patient had a PCN reaction occurring within the last 10 years: Yes If all of the above answers are NO, then may proceed with Cephalosporin use.  Other reaction(s): yeast  infection     Objective: General: Patient is awake, alert, and oriented x 3 and in no acute distress.  Integument: Skin is warm, dry and supple bilateral. Nails are tender, long, thickened and  dystrophic with subungual debris, consistent with onychomycosis, 1-5 bilateral. No signs of infection. Lateral fifth metatarsal base noted ulceration healed. Remaining integument unremarkable.  Vasculature:  Dorsalis Pedis pulse 1/4 bilateral. Posterior Tibial pulse  1/4 bilateral.  Capillary fill time <3 sec 1-5 bilateral. Positive hair growth to the level of the digits. Temperature gradient within normal limits. No varicosities present bilateral. No edema present bilateral.   Neurology: The patient has intact sensation measured with a 5.07/10g Semmes Weinstein Monofilament at all pedal sites bilateral . Vibratory sensation diminished bilateral with tuning fork. No Babinski sign present bilateral.   Musculoskeletal: No symptomatic pedal deformities noted bilateral. Muscular strength 5/5 in all lower extremity muscular groups bilateral without pain on range of motion . No tenderness with calf compression bilateral.  Summary:  Right: Resting right ankle-brachial index is within normal range. The  right toe-brachial index is abnormal.   Left: Resting left ankle-brachial  index is within normal range. The left  toe-brachial index is abnormal.   Assessment and Plan:   ICD-10-CM   1. Pain due to onychomycosis of nail  B35.1    M79.609            -Examined patient. Ulcer lateral plantar fifth metatarsal base right foot healed -Ucleration healed debridement of hyperkeratotic overlying tissue with chisel without incident.  -Off-loading with donut pads  -Mechanically debrided all nails 1-5 bilateral using sterile nail nipper and filed with dremel without incident  -Answered all patient questions -Patient to return  in 3 months for at risk foot care -Patient advised to call the office if any  problems or questions arise in the meantime.    No follow-ups on file.    Asberry Failing, DPM

## 2023-12-22 NOTE — Progress Notes (Unsigned)
 [place] - [place] - [place] - [place] - [place]   Follow-up Note  Referring Provider: Leonel Cole, MD Primary Provider: Leonel Cole, MD Date of Office Visit: 12/22/2023  Subjective:   Taylor Berry (DOB: Oct 04, 1940) is a 83 y.o. female [facility] in re-evaluation of the following:  HPI: Taylor Berry returns to this clinic in evaluation of COPD/asthma overlap, allergic rhinitis, LPR.  I last saw her in this clinic 23 June 2023.  She has done well with her asthma and does not require short acting bronchodilator.  She does not exert herself because of the stamina issue and a musculoskeletal issue tied up with rheumatoid arthritis.  Cold air does not appear to precipitate any breathing problems.  She has not required systemic steroid to treat an exacerbation of asthma.  She continues to use Trelegy on a consistent basis.  She has had very little problems with her upper airways.  She has not required a antibiotic to treat an episode of sinusitis.  She continues on Flonase  daily.  She is working with oncology for her MGUS which appears to be stable and for an issue with anemia and lymphopenia in the setting of using Areva for her rheumatoid arthritis..  She informs me that she had a fall and may have bruised her coccyx for which she has had evaluation and is presently using some gabapentin .  Allergies as of 12/22/2023       Reactions   Latex Itching, Rash   Metoprolol  Tartrate Other (See Comments), Shortness Of Breath   Ace Inhibitors Cough   Angiotensin Receptor Blockers Swelling, Other (See Comments)   Tongue swelling   Fentanyl  Other (See Comments)   Hallucinations I go crazy     Guaifenesin  Er Diarrhea   Other Reaction(s): Not available   Hydrochlorothiazide Other (See Comments)   Decreased Sodium   Hydroxychloroquine Other (See Comments)   Decreased sodium   Penicillamine Nausea And Vomiting   Penicillins Other (See  Comments)   Yeast infections Has patient had a PCN reaction causing immediate rash, facial/tongue/throat swelling, SOB or lightheadedness with hypotension:No Has patient had a PCN reaction causing severe rash involving mucus membranes or skin necrosis: No Has patient had a PCN reaction that required hospitalization No Has patient had a PCN reaction occurring within the last 10 years: Yes If all of the above answers are NO, then may proceed with Cephalosporin use. Other reaction(s): yeast infection        Medication List    albuterol  108 (90 Base) MCG/ACT inhaler Commonly known as: VENTOLIN  HFA Inhale 2 puffs into the lungs every 4 (four) hours as needed for wheezing or shortness of breath.   atorvastatin  40 MG tablet Commonly known as: LIPITOR Take 1 tablet (40 mg total) by mouth daily.   CALCIUM  + D3 PO Take 1 tablet by mouth daily.   CALCIUM -VITAMIN D  PO Take 1 tablet by mouth daily.   candesartan  8 MG tablet Commonly known as: ATACAND  Take 1 tablet (8 mg total) by mouth daily.   celecoxib  200 MG capsule Commonly known as: CELEBREX  Take 1 capsule (200 mg total) by mouth daily.   clopidogrel  75 MG tablet Commonly known as: PLAVIX  Take 1 tablet (75 mg total) by mouth daily.   fluticasone  50 MCG/ACT nasal spray Commonly known as: FLONASE  Place 2 sprays into both nostrils every morning.   gabapentin  100 MG capsule Commonly known as: NEURONTIN  Take 1-3 capsules (100-300 mg total) by  mouth Nightly.   leflunomide  20 MG tablet Commonly known as: ARAVA  Take 1 tablet (20 mg total) by mouth daily.   leflunomide  10 MG tablet Commonly known as: ARAVA  Take 1 tablet (10 mg total) by mouth daily.   leflunomide  20 MG tablet Commonly known as: ARAVA  Take 1 tablet (20 mg total) by mouth daily.   loperamide 2 MG tablet Commonly known as: IMODIUM A-D Take 2 mg by mouth as needed.   meclizine  25 MG tablet Commonly known as: ANTIVERT  Take 1 tablet (25 mg total) by  mouth up to 3 (three) times daily as needed for vertigo.   METAMUCIL FIBER PO Take by mouth See admin instructions. Mix 1 tablespoonful of powder into 4-8 ounces of desired beverage and drink once a day   methylPREDNISolone  4 MG Tbpk tablet Commonly known as: Medrol  Take by mouth as directed   PreserVision AREDS Caps Take by mouth.   Trelegy Ellipta  100-62.5-25 MCG/ACT Aepb Generic drug: Fluticasone -Umeclidin-Vilant Inhale 1 puff into the lungs daily.   Trelegy Ellipta  100-62.5-25 MCG/ACT Aepb Generic drug: Fluticasone -Umeclidin-Vilant 2 samples   Trelegy Ellipta  100-62.5-25 MCG/ACT Aepb Generic drug: Fluticasone -Umeclidin-Vilant 2 samples   Trelegy Ellipta  100-62.5-25 MCG/ACT Aepb Generic drug: Fluticasone -Umeclidin-Vilant Inhale 1 puff into the lungs daily.   triamcinolone  ointment 0.1 % Commonly known as: KENALOG  APPLY ON THE SKIN DAILY AS NEEDED FOR BUG BITES   Vitamin B12 1000 MCG Tbcr Take 1 tablet by mouth daily.    Past Medical History:  Diagnosis Date   Abnormal CT scan    Allergic rhinitis    Anemia 2015   after lung surgery   Angio-edema    Complication of anesthesia    Fentanyl  allergy, claustrophobia   COPD (chronic obstructive pulmonary disease) (HCC)    Depression    Diaphoresis 07/30/2015   Excessive sweating with little exertion   Difficult intravenous access    GERD (gastroesophageal reflux disease)    In the past   Hyperlipidemia    Hypertension    Left bundle branch block    Lung cancer (HCC) 2015   left upper lobe wedge removed-no further tx.   Neuropathy    Tingling in arms, fingers, and feet (Since 06/15/16)   Obesity    OSA (obstructive sleep apnea)    denies.   Osteoarthritis    ra also   Osteoporosis    Recurrent upper respiratory infection (URI)    Rheumatoid arthritis(714.0)    Dr. Mai   SOB (shortness of breath) on exertion    Uterine cancer (HCC) 1971   tx surgical    Past Surgical History:  Procedure  Laterality Date   ABDOMINAL HYSTERECTOMY     in situ carcinoma partial   CARDIAC CATHETERIZATION  01/27/11   minor non-obs CAD, NL EF, mild pulm HTN   CATARACT EXTRACTION, BILATERAL     last done 12'16-   COLONOSCOPY W/ BIOPSIES AND POLYPECTOMY     COLONOSCOPY WITH PROPOFOL  N/A 02/13/2015   Procedure: COLONOSCOPY WITH PROPOFOL ;  Surgeon: Lynwood Bohr, MD;  Location: [facility];  Service: Endoscopy;  Laterality: N/A;   ESOPHAGOGASTRODUODENOSCOPY (EGD) WITH PROPOFOL  N/A 09/30/2016   Procedure: ESOPHAGOGASTRODUODENOSCOPY (EGD) WITH PROPOFOL ;  Surgeon: Bohr Lynwood, MD;  Location: [facility];  Service: Endoscopy;  Laterality: N/A;   LYMPH NODE DISSECTION Left 04/12/2013   Procedure: LYMPH NODE DISSECTION;  Surgeon: Dallas KATHEE Jude, MD;  Location: [facility] OR;  Service: Thoracic;  Laterality: Left;   RIGHT/LEFT HEART CATH AND CORONARY ANGIOGRAPHY N/A 10/08/2016   Procedure:  RIGHT/LEFT HEART CATH AND CORONARY ANGIOGRAPHY;  Surgeon: Verlin Lonni BIRCH, MD;  Location: [facility];  Service: Cardiovascular;  Laterality: N/A;   TONSILLECTOMY     VESICOVAGINAL FISTULA CLOSURE W/ TAH  1971   VIDEO ASSISTED THORACOSCOPY (VATS)/THOROCOTOMY Left 04/12/2013   Procedure: VIDEO ASSISTED THORACOSCOPY (VATS)/THOROCOTOMY, WITH LEFT UPPER LOBE WEDGE RESECTION, CHEST WALL BIOPSY ;  Surgeon: Dallas KATHEE Jude, MD;  Location: [facility] OR;  Service: Thoracic;  Laterality: Left;   VIDEO BRONCHOSCOPY N/A 04/12/2013   Procedure: VIDEO BRONCHOSCOPY;  Surgeon: Dallas KATHEE Jude, MD;  Location: [facility] OR;  Service: Thoracic;  Laterality: N/A;    Review of systems negative except as noted in HPI / PMHx or noted below:  Review of Systems  Constitutional: Negative.   HENT: Negative.    Eyes: Negative.   Respiratory: Negative.    Cardiovascular: Negative.   Gastrointestinal: Negative.   Genitourinary: Negative.   Musculoskeletal: Negative.   Skin: Negative.   Neurological: Negative.   Endo/Heme/Allergies: Negative.    Psychiatric/Behavioral: Negative.       Objective:   Vitals:   12/22/23 1108  BP: 106/62  Pulse: 76  Resp: 14  Temp: 98.9 F (37.2 C)  SpO2: 97%   Height: 5' 2 (157.5 cm)  Weight: 150 lb (68 kg)   Physical Exam Constitutional:      Appearance: She is not diaphoretic.  HENT:     Head: Normocephalic.     Right Ear: Tympanic membrane, ear canal and external ear normal.     Left Ear: Tympanic membrane, ear canal and external ear normal.     Nose: Nose normal. No mucosal edema or rhinorrhea.     Mouth/Throat:     Pharynx: Uvula midline. No oropharyngeal exudate.  Eyes:     Conjunctiva/sclera: Conjunctivae normal.  Neck:     Thyroid : No thyromegaly.     Trachea: Trachea normal. No tracheal tenderness or tracheal deviation.  Cardiovascular:     Rate and Rhythm: Normal rate and regular rhythm.     Heart sounds: Normal heart sounds, S1 normal and S2 normal. No murmur heard. Pulmonary:     Effort: No respiratory distress.     Breath sounds: Normal breath sounds. No stridor. No wheezing (Inspiratory crackles bases bilaterally) or rales.  Lymphadenopathy:     Head:     Right side of head: No tonsillar adenopathy.     Left side of head: No tonsillar adenopathy.     Cervical: No cervical adenopathy.  Skin:    Findings: No erythema or rash.     Nails: There is no clubbing.  Neurological:     Mental Status: She is alert.     Diagnostics: Spirometry was performed and demonstrated an FEV1 of 0.96 at 55 % of predicted.  Assessment and Plan:   1. COPD with asthma (HCC)   2. Perennial allergic rhinitis     1.  Continue to treat inflammation of airway:  A. Flonase  -1-2 sprays each nostril 1 time per day B. Trelegy 100 - 1 inhalation 1 time per day.    3.  If needed:   A. Albuterol  - 2 inhalations every 4-6 hours.  4. Return to clinic in 6 months or earlier if problem  5. Influenza = Tamiflu. Covid = Paxlovid/molnupiravir  Taylor Berry appears to be doing okay on her  current plan of using anti-inflammatory agents for her airway.  She does have inspiratory crackles at the bases of her lungs most likely secondary to some degree  inadequate cardiac function but she is very stable and basically has done well over the course of the past 6 months without any significant deterioration in her respiratory status.  Not really going to change any of her therapy during today's visit.  Will see her back in this clinic in 6 months or earlier if there is a problem.   Taylor Denis, MD Allergy / Immunology [facility]

## 2023-12-23 ENCOUNTER — Encounter: Payer: Self-pay | Admitting: Allergy and Immunology

## 2023-12-25 ENCOUNTER — Ambulatory Visit: Admitting: Physical Therapy

## 2023-12-29 ENCOUNTER — Telehealth: Admitting: Hematology

## 2023-12-29 ENCOUNTER — Other Ambulatory Visit: Payer: Self-pay

## 2023-12-29 ENCOUNTER — Telehealth: Payer: Self-pay | Admitting: *Deleted

## 2023-12-29 ENCOUNTER — Other Ambulatory Visit (HOSPITAL_COMMUNITY): Payer: Self-pay

## 2023-12-29 ENCOUNTER — Inpatient Hospital Stay: Admitting: Physician Assistant

## 2023-12-29 DIAGNOSIS — D472 Monoclonal gammopathy: Secondary | ICD-10-CM

## 2023-12-29 DIAGNOSIS — D649 Anemia, unspecified: Secondary | ICD-10-CM

## 2023-12-29 MED ORDER — TRAMADOL HCL 50 MG PO TABS
ORAL_TABLET | ORAL | 0 refills | Status: AC
  Filled 2023-12-29: qty 10, 3d supply, fill #0

## 2023-12-29 MED ORDER — CHLORHEXIDINE GLUCONATE 0.12 % MT SOLN
OROMUCOSAL | 0 refills | Status: AC
  Filled 2023-12-29: qty 473, 10d supply, fill #0

## 2023-12-29 NOTE — Progress Notes (Signed)
 "   HEMATOLOGY/ONCOLOGY TELEMEDICINE NOTE  Date of Service: 12/29/2023  Patient Care Team: Leonel Cole, MD as PCP - General (Family Medicine) Pietro Redell RAMAN, MD as PCP - Cardiology (Cardiology) Shelah Lamar RAMAN, MD as Consulting Physician (Pulmonary Disease) Mai Lynwood FALCON, MD as Consulting Physician (Rheumatology)  CHIEF COMPLAINTS/PURPOSE OF CONSULTATION:  Evaluation and management of leukopenia  I connected with Taylor Berry  on 12/29/2023 by telephone visit and verified that I am speaking with the correct person using two identifiers.   I discussed the limitations, risks, security and privacy concerns of performing an evaluation and management service by telemedicine and the availability of in-person appointments. I also discussed with the patient that there may be a patient responsible charge related to this service. The patient expressed understanding and agreed to proceed.  Patient's location: Home Provider's location: Office  INTERVAL HISTORY:  Taylor Berry is a 83 y.o. female being connected with via telemedicine visit for continued evaluation and management of MGUS, anemia and leukopenia. She was last evaluated by Dr. Onesimo on 07/03/2023.  Taylor Berry reports she has noticed worsening fatigue but still can complete her basic ADLs on her own.  She struggles to complete her chores and needs assistance for that.  She is planning to undergo some dental work and get 2 of her teeth extracted.  She has noticed decreased appetite and has lost approximately 4 pounds since the last visit.  She denies nausea, vomiting or bowel habit changes.  She does have chronic shortness of breath secondary to COPD.  She takes Trelegy which does improve her breathing.  She denies easy bruising or overt sites of bleeding.  She has ongoing RA flareups while currently on leflunomide  therapy.  She denies fevers, chills, night sweats, chest pain or cough.  She has no other complaints.   MEDICAL  HISTORY:  Past Medical History:  Diagnosis Date   Abnormal CT scan    Allergic rhinitis    Anemia 2015   after lung surgery   Angio-edema    Complication of anesthesia    Fentanyl  allergy, claustrophobia   COPD (chronic obstructive pulmonary disease) (HCC)    Depression    Diaphoresis 07/30/2015   Excessive sweating with little exertion   Difficult intravenous access    GERD (gastroesophageal reflux disease)    In the past   Hyperlipidemia    Hypertension    Left bundle branch block    Lung cancer (HCC) 2015   left upper lobe wedge removed-no further tx.   Neuropathy    Tingling in arms, fingers, and feet (Since 06/15/16)   Obesity    OSA (obstructive sleep apnea)    denies.   Osteoarthritis    ra also   Osteoporosis    Recurrent upper respiratory infection (URI)    Rheumatoid arthritis(714.0)    Dr. Mai   SOB (shortness of breath) on exertion    Uterine cancer (HCC) 1971   tx surgical    SURGICAL HISTORY: Past Surgical History:  Procedure Laterality Date   ABDOMINAL HYSTERECTOMY     in situ carcinoma partial   CARDIAC CATHETERIZATION  01/27/11   minor non-obs CAD, NL EF, mild pulm HTN   CATARACT EXTRACTION, BILATERAL     last done 12'16-   COLONOSCOPY W/ BIOPSIES AND POLYPECTOMY     COLONOSCOPY WITH PROPOFOL  N/A 02/13/2015   Procedure: COLONOSCOPY WITH PROPOFOL ;  Surgeon: Lynwood Bohr, MD;  Location: [facility];  Service: Endoscopy;  Laterality: N/A;   ESOPHAGOGASTRODUODENOSCOPY (  EGD) WITH PROPOFOL  N/A 09/30/2016   Procedure: ESOPHAGOGASTRODUODENOSCOPY (EGD) WITH PROPOFOL ;  Surgeon: Celestia Agent, MD;  Location: [facility];  Service: Endoscopy;  Laterality: N/A;   LYMPH NODE DISSECTION Left 04/12/2013   Procedure: LYMPH NODE DISSECTION;  Surgeon: Dallas KATHEE Jude, MD;  Location: [facility] OR;  Service: Thoracic;  Laterality: Left;   RIGHT/LEFT HEART CATH AND CORONARY ANGIOGRAPHY N/A 10/08/2016   Procedure: RIGHT/LEFT HEART CATH AND CORONARY ANGIOGRAPHY;  Surgeon:  Verlin Lonni BIRCH, MD;  Location: [facility];  Service: Cardiovascular;  Laterality: N/A;   TONSILLECTOMY     VESICOVAGINAL FISTULA CLOSURE W/ TAH  1971   VIDEO ASSISTED THORACOSCOPY (VATS)/THOROCOTOMY Left 04/12/2013   Procedure: VIDEO ASSISTED THORACOSCOPY (VATS)/THOROCOTOMY, WITH LEFT UPPER LOBE WEDGE RESECTION, CHEST WALL BIOPSY ;  Surgeon: Dallas KATHEE Jude, MD;  Location: [facility] OR;  Service: Thoracic;  Laterality: Left;   VIDEO BRONCHOSCOPY N/A 04/12/2013   Procedure: VIDEO BRONCHOSCOPY;  Surgeon: Dallas KATHEE Jude, MD;  Location: [facility] OR;  Service: Thoracic;  Laterality: N/A;    SOCIAL HISTORY: Social History   Socioeconomic History   Marital status: Widowed    Spouse name: Not on file   Number of children: 2   Years of education: College   Highest education level: Master's degree (e.g., MA, MS, MEng, MEd, MSW, MBA)  Occupational History   Occupation: Glass Blower/designer: [employer]  Tobacco Use   Smoking status: Former    Current packs/day: 0.00    Average packs/day: 1 pack/day for 35.0 years (35.0 ttl pk-yrs)    Types: Cigarettes    Start date: 01/06/1953    Quit date: 01/07/1988    Years since quitting: 36.0   Smokeless tobacco: Never  Vaping Use   Vaping status: Never Used  Substance and Sexual Activity   Alcohol use: No    Comment: not since 1988   Drug use: No   Sexual activity: Not Currently  Other Topics Concern   Not on file  Social History Narrative   Not on file   Social Drivers of Health   Tobacco Use: Medium Risk (12/23/2023)   Patient History    Smoking Tobacco Use: Former    Smokeless Tobacco Use: Never    Passive Exposure: Not on Actuary Strain: Not on file  Food Insecurity: Not on file  Transportation Needs: Not on file  Physical Activity: Not on file  Stress: Not on file  Social Connections: Not on file  Intimate Partner Violence: Not on file  Depression (PHQ2-9): Not on file  Alcohol Screen: Not on file  Housing: Not on  file  Utilities: Not on file  Health Literacy: Not on file    FAMILY HISTORY: Family History  Problem Relation Age of Onset   Lung cancer Mother    Coronary artery disease Son    Heart attack Son 30   Arthritis/Rheumatoid Maternal Grandmother    Coronary artery disease Son    Arthritis/Rheumatoid Son     ALLERGIES:  is allergic to latex, metoprolol  tartrate, ace inhibitors, angiotensin receptor blockers, fentanyl , guaifenesin  er, hydrochlorothiazide, hydroxychloroquine, penicillamine, and penicillins.  MEDICATIONS:  Current Outpatient Medications  Medication Sig Dispense Refill   albuterol  (VENTOLIN  HFA) 108 (90 Base) MCG/ACT inhaler Inhale 2 puffs into the lungs every 4 (four) hours as needed for wheezing or shortness of breath. 6.7 g 1   atorvastatin  (LIPITOR) 40 MG tablet Take 1 tablet (40 mg total) by mouth daily. 90 tablet 3   Calcium  Carb-Cholecalciferol (CALCIUM  +  D3 PO) Take 1 tablet by mouth daily.     CALCIUM -VITAMIN D  PO Take 1 tablet by mouth daily.     candesartan  (ATACAND ) 8 MG tablet Take 1 tablet (8 mg total) by mouth daily. 90 tablet 2   celecoxib  (CELEBREX ) 200 MG capsule Take 1 capsule (200 mg total) by mouth daily. 30 capsule 1   chlorhexidine  (PERIDEX ) 0.12 % solution GENTLY RINSE WITH 15 ML BY MOUTH FOR 2 MINUTES TWICE A DAY, SPIT AFTER USE 473 mL 0   clopidogrel  (PLAVIX ) 75 MG tablet Take 1 tablet (75 mg total) by mouth daily. 90 tablet 3   Cyanocobalamin  (VITAMIN B12) 1000 MCG TBCR Take 1 tablet by mouth daily.     fluticasone  (FLONASE ) 50 MCG/ACT nasal spray Place 2 sprays into both nostrils every morning. 48 g 1   Fluticasone -Umeclidin-Vilant (TRELEGY ELLIPTA ) 100-62.5-25 MCG/ACT AEPB Inhale 1 puff into the lungs daily. 180 each 1   Fluticasone -Umeclidin-Vilant (TRELEGY ELLIPTA ) 100-62.5-25 MCG/ACT AEPB 2 samples     Fluticasone -Umeclidin-Vilant (TRELEGY ELLIPTA ) 100-62.5-25 MCG/ACT AEPB 2 samples     Fluticasone -Umeclidin-Vilant (TRELEGY ELLIPTA )  100-62.5-25 MCG/ACT AEPB Inhale 1 puff into the lungs daily. 28 each 3   gabapentin  (NEURONTIN ) 100 MG capsule Take 1-3 capsules (100-300 mg total) by mouth Nightly. 60 capsule 1   leflunomide  (ARAVA ) 10 MG tablet Take 1 tablet (10 mg total) by mouth daily. 90 tablet 1   leflunomide  (ARAVA ) 20 MG tablet Take 1 tablet (20 mg total) by mouth daily. (Patient not taking: Reported on 09/02/2023) 90 tablet 1   leflunomide  (ARAVA ) 20 MG tablet Take 1 tablet (20 mg total) by mouth daily. 90 tablet 1   loperamide (IMODIUM A-D) 2 MG tablet Take 2 mg by mouth as needed.     meclizine  (ANTIVERT ) 25 MG tablet Take 1 tablet (25 mg total) by mouth up to 3 (three) times daily as needed for vertigo. 20 tablet 0   METAMUCIL FIBER PO Take by mouth See admin instructions. Mix 1 tablespoonful of powder into 4-8 ounces of desired beverage and drink once a day     methylPREDNISolone  (MEDROL ) 4 MG TBPK tablet Take by mouth as directed 21 tablet 0   Multiple Vitamins-Minerals (PRESERVISION AREDS) CAPS Take by mouth.     traMADol  (ULTRAM ) 50 MG tablet Take 1 tablet by mouth every 4 to 6 hours as needed for pain. 10 tablet 0   triamcinolone  ointment (KENALOG ) 0.1 % APPLY ON THE SKIN DAILY AS NEEDED FOR BUG BITES 80 g 1   No current facility-administered medications for this visit.    REVIEW OF SYSTEMS:    10 Point review of Systems was done is negative except as noted above.  PHYSICAL EXAMINATION: Telemedicine visit  LABORATORY DATA:  I have reviewed the data as listed     Latest Ref Rng & Units 12/17/2023    2:32 PM 06/15/2023    3:51 PM 06/10/2021   10:45 AM  CBC  WBC 4.0 - 10.5 K/uL 4.3  4.7  6.7   Hemoglobin 12.0 - 15.0 g/dL 9.8  9.6  88.9   Hematocrit 36.0 - 46.0 % 30.5  29.8  33.4   Platelets 150 - 400 K/uL 193  246  252    05/06/2023 CBC with Differential:      Latest Ref Rng & Units 12/17/2023    2:32 PM 06/15/2023    3:51 PM 05/26/2021    2:55 AM  CMP  Glucose 70 - 99 mg/dL 886  894  95  BUN 8 -  23 mg/dL 21  25  17    Creatinine 0.44 - 1.00 mg/dL 9.15  9.22  9.18   Sodium 135 - 145 mmol/L 134  135  137   Potassium 3.5 - 5.1 mmol/L 4.5  4.3  4.0   Chloride 98 - 111 mmol/L 99  102  110   CO2 22 - 32 mmol/L 25  27  22    Calcium  8.9 - 10.3 mg/dL 89.6  9.3  9.0   Total Protein 6.5 - 8.1 g/dL 7.4  6.8    Total Bilirubin 0.0 - 1.2 mg/dL 0.6  0.4    Alkaline Phos 38 - 126 U/L 121  87    AST 15 - 41 U/L 50  29    ALT 0 - 44 U/L 12  10      RADIOGRAPHIC STUDIES: I have personally reviewed the radiological images as listed and agreed with the findings in the report. No results found.  ASSESSMENT & PLAN:  Taylor Berry is a 83 y.o. female who presents to the clinic for continued management of MGUS, anemia and leukopenia.   #IgG Kappa MGUS: --Reviewed labs from 12/17/2023. SPEP detected stable M protein measuring 0.6 g/dL. Kappa light chain stable at 35.0 mg/L with normal ratio. Anemia stable at 9.8 with no other cytopenias. Creatinine and calcium  levels are normal --No indication of transformation to active myeloma --Most recent PET scan from 02/18/2023 showed no bone lesions. Will schedule bone met survey around February 2026.  --Continue with observation  #Leukopenia: --Likely secondary to RA and leflunomide  and steroid use.  --Labs from 12/17/2023 showed WBC normal  --Monitor for now.   #Anemia: --Secondary to underlying RA and chronic inflammation --Labs from 06/15/2023 showed no evidence of B12 deficiency --Monitor for now and consider bone marrow biopsy if anemia worsens  #Elevated AST: --AST was mildly elevated at 50 --Patient has follow up with PCP next week so will request to repeat hepatic panel to monitor levels.     FOLLOW-UP: 6 months: labs and follow up  All of the patient's questions were answered with apparent satisfaction. The patient knows to call the clinic with any problems, questions or concerns.   I have spent a total of 30 minutes minutes of  non-face-to-face time, preparing to see the patient,counseling and educating the patient, ordering tests/procedures,  documenting clinical information in the electronic health record, independently interpreting results and communicating results to the patient, and care coordination.   Johnston Police PA-C Dept of Hematology and Oncology [facility] Phone: [phone]   "

## 2023-12-29 NOTE — Telephone Encounter (Signed)
 Office notes faxed to Dr Cheryle Frees

## 2024-01-08 ENCOUNTER — Ambulatory Visit: Admitting: Physical Therapy

## 2024-01-11 ENCOUNTER — Ambulatory Visit: Admitting: Physical Therapy

## 2024-01-27 ENCOUNTER — Other Ambulatory Visit (HOSPITAL_COMMUNITY): Payer: Self-pay

## 2024-02-09 ENCOUNTER — Other Ambulatory Visit (HOSPITAL_BASED_OUTPATIENT_CLINIC_OR_DEPARTMENT_OTHER): Payer: Self-pay

## 2024-02-09 ENCOUNTER — Telehealth: Payer: Self-pay | Admitting: Allergy and Immunology

## 2024-02-09 ENCOUNTER — Ambulatory Visit (HOSPITAL_COMMUNITY)

## 2024-02-10 ENCOUNTER — Other Ambulatory Visit: Payer: Self-pay

## 2024-02-10 ENCOUNTER — Telehealth: Payer: Self-pay

## 2024-02-10 ENCOUNTER — Other Ambulatory Visit (HOSPITAL_COMMUNITY): Payer: Self-pay

## 2024-02-10 ENCOUNTER — Other Ambulatory Visit: Payer: Self-pay | Admitting: Family Medicine

## 2024-02-10 NOTE — Telephone Encounter (Signed)
 Copied from CRM #[id number]. Topic: Clinical - Medication Question >> Feb 09, 2024  5:33 PM Dedra B wrote: Reason for CRM: Patient wants to know if she can pick up samples of Fluticasone -Umeclidin-Vilant (TRELEGY ELLIPTA ) 100-62.5-25 MCG/ACT AEPB.    Pt states Trelegy is to expensive and cannot afford it. I advised pt we do not have any samples in the office. Also advised she contact PCP office and they may have samples.  Pt has tried PAP and was denied.  Pt would like to know if there is a cheaper alternative available.  Pharmacy can you please advise of cheaper formulary alternatives.

## 2024-02-10 NOTE — Telephone Encounter (Signed)
 Spoke with patient- she stated that her copay is high and she had discussed with Dr Kozlow in the past that if we had samples we could provide. I placed samples at the front desk in [place]- she says her son will be coming by to pick up.

## 2024-02-10 NOTE — Telephone Encounter (Signed)
 Per test claim patient has a deductible to meet before pricing will go down. Current copay through [facility] is $ 167.31/30 days. Once patient meets her deductible this price should go down. Alternative triple therapy of Breztri  is showing as $164.12 at this time.

## 2024-02-15 ENCOUNTER — Other Ambulatory Visit (HOSPITAL_COMMUNITY)

## 2024-04-20 ENCOUNTER — Ambulatory Visit: Admitting: Podiatry

## 2024-06-14 ENCOUNTER — Inpatient Hospital Stay

## 2024-06-28 ENCOUNTER — Ambulatory Visit: Admitting: Allergy and Immunology

## 2024-06-29 ENCOUNTER — Inpatient Hospital Stay: Admitting: Hematology
